# Patient Record
Sex: Female | Born: 1935 | Race: White | Hispanic: No | Marital: Married | Smoking: Former smoker
Health system: Southern US, Community
[De-identification: ages and names within clinical notes are randomized; demographics above are authoritative.]

## PROBLEM LIST (undated history)

## (undated) DIAGNOSIS — F329 Major depressive disorder, single episode, unspecified: Secondary | ICD-10-CM

## (undated) DIAGNOSIS — M545 Low back pain: Secondary | ICD-10-CM

## (undated) DIAGNOSIS — K219 Gastro-esophageal reflux disease without esophagitis: Secondary | ICD-10-CM

## (undated) DIAGNOSIS — Z9289 Personal history of other medical treatment: Secondary | ICD-10-CM

## (undated) DIAGNOSIS — F0390 Unspecified dementia without behavioral disturbance: Secondary | ICD-10-CM

## (undated) DIAGNOSIS — I5032 Chronic diastolic (congestive) heart failure: Secondary | ICD-10-CM

## (undated) DIAGNOSIS — M4800 Spinal stenosis, site unspecified: Secondary | ICD-10-CM

## (undated) DIAGNOSIS — F319 Bipolar disorder, unspecified: Secondary | ICD-10-CM

## (undated) DIAGNOSIS — M47816 Spondylosis without myelopathy or radiculopathy, lumbar region: Secondary | ICD-10-CM

## (undated) DIAGNOSIS — K224 Dyskinesia of esophagus: Secondary | ICD-10-CM

## (undated) DIAGNOSIS — E785 Hyperlipidemia, unspecified: Secondary | ICD-10-CM

## (undated) DIAGNOSIS — I10 Essential (primary) hypertension: Secondary | ICD-10-CM

## (undated) DIAGNOSIS — G8929 Other chronic pain: Secondary | ICD-10-CM

## (undated) DIAGNOSIS — G5603 Carpal tunnel syndrome, bilateral upper limbs: Secondary | ICD-10-CM

## (undated) DIAGNOSIS — F32A Depression, unspecified: Secondary | ICD-10-CM

## (undated) DIAGNOSIS — J189 Pneumonia, unspecified organism: Secondary | ICD-10-CM

## (undated) HISTORY — DX: Unspecified dementia, unspecified severity, without behavioral disturbance, psychotic disturbance, mood disturbance, and anxiety: F03.90

## (undated) HISTORY — DX: Personal history of other medical treatment: Z92.89

## (undated) HISTORY — PX: INNER EAR SURGERY: SHX679

## (undated) HISTORY — DX: Chronic diastolic (congestive) heart failure: I50.32

## (undated) HISTORY — PX: HIP SURGERY: SHX245

---

## 2003-04-15 ENCOUNTER — Encounter: Payer: Self-pay | Admitting: Emergency Medicine

## 2003-04-15 ENCOUNTER — Inpatient Hospital Stay (HOSPITAL_COMMUNITY): Admission: EM | Admit: 2003-04-15 | Discharge: 2003-04-27 | Payer: Self-pay | Admitting: Psychiatry

## 2004-04-12 ENCOUNTER — Emergency Department: Payer: Self-pay | Admitting: Emergency Medicine

## 2007-10-25 ENCOUNTER — Other Ambulatory Visit: Payer: Self-pay

## 2007-10-25 ENCOUNTER — Inpatient Hospital Stay: Payer: Self-pay | Admitting: Internal Medicine

## 2007-11-14 ENCOUNTER — Ambulatory Visit: Payer: Self-pay | Admitting: Unknown Physician Specialty

## 2007-11-28 ENCOUNTER — Ambulatory Visit: Payer: Self-pay | Admitting: Unknown Physician Specialty

## 2008-03-30 ENCOUNTER — Ambulatory Visit: Payer: Self-pay | Admitting: Unknown Physician Specialty

## 2008-06-05 ENCOUNTER — Emergency Department: Payer: Self-pay | Admitting: Emergency Medicine

## 2009-02-17 ENCOUNTER — Emergency Department: Payer: Self-pay | Admitting: Emergency Medicine

## 2009-02-28 ENCOUNTER — Emergency Department: Payer: Self-pay | Admitting: Internal Medicine

## 2009-06-14 ENCOUNTER — Emergency Department: Payer: Self-pay | Admitting: Emergency Medicine

## 2009-10-13 ENCOUNTER — Emergency Department: Payer: Self-pay | Admitting: Unknown Physician Specialty

## 2010-03-15 ENCOUNTER — Inpatient Hospital Stay: Payer: Self-pay | Admitting: Internal Medicine

## 2010-03-17 ENCOUNTER — Inpatient Hospital Stay: Payer: Self-pay | Admitting: Psychiatry

## 2010-04-08 ENCOUNTER — Ambulatory Visit: Payer: Self-pay | Admitting: Psychiatry

## 2010-04-27 ENCOUNTER — Ambulatory Visit: Payer: Self-pay | Admitting: Psychiatry

## 2010-04-27 ENCOUNTER — Ambulatory Visit: Payer: Self-pay | Admitting: Unknown Physician Specialty

## 2010-05-26 ENCOUNTER — Ambulatory Visit: Payer: Self-pay | Admitting: Psychiatry

## 2010-06-27 ENCOUNTER — Ambulatory Visit: Payer: Self-pay | Admitting: Psychiatry

## 2010-07-26 ENCOUNTER — Ambulatory Visit: Payer: Self-pay | Admitting: Psychiatry

## 2010-08-26 ENCOUNTER — Ambulatory Visit: Payer: Self-pay | Admitting: Psychiatry

## 2010-09-25 ENCOUNTER — Ambulatory Visit: Payer: Self-pay | Admitting: Psychiatry

## 2010-10-26 ENCOUNTER — Ambulatory Visit: Payer: Self-pay | Admitting: Psychiatry

## 2010-11-26 ENCOUNTER — Ambulatory Visit: Payer: Self-pay | Admitting: Psychiatry

## 2010-12-21 DIAGNOSIS — K224 Dyskinesia of esophagus: Secondary | ICD-10-CM | POA: Insufficient documentation

## 2010-12-21 DIAGNOSIS — F32A Depression, unspecified: Secondary | ICD-10-CM | POA: Insufficient documentation

## 2010-12-21 DIAGNOSIS — M47816 Spondylosis without myelopathy or radiculopathy, lumbar region: Secondary | ICD-10-CM | POA: Insufficient documentation

## 2010-12-21 DIAGNOSIS — M545 Low back pain, unspecified: Secondary | ICD-10-CM | POA: Insufficient documentation

## 2010-12-21 DIAGNOSIS — G5600 Carpal tunnel syndrome, unspecified upper limb: Secondary | ICD-10-CM | POA: Insufficient documentation

## 2010-12-21 DIAGNOSIS — K219 Gastro-esophageal reflux disease without esophagitis: Secondary | ICD-10-CM | POA: Insufficient documentation

## 2010-12-21 DIAGNOSIS — G8929 Other chronic pain: Secondary | ICD-10-CM | POA: Insufficient documentation

## 2010-12-21 DIAGNOSIS — E785 Hyperlipidemia, unspecified: Secondary | ICD-10-CM | POA: Insufficient documentation

## 2010-12-21 DIAGNOSIS — M4800 Spinal stenosis, site unspecified: Secondary | ICD-10-CM | POA: Insufficient documentation

## 2010-12-21 DIAGNOSIS — F329 Major depressive disorder, single episode, unspecified: Secondary | ICD-10-CM | POA: Insufficient documentation

## 2010-12-26 ENCOUNTER — Ambulatory Visit: Payer: Self-pay | Admitting: Psychiatry

## 2011-01-26 ENCOUNTER — Ambulatory Visit: Payer: Self-pay | Admitting: Unknown Physician Specialty

## 2011-02-27 ENCOUNTER — Ambulatory Visit: Payer: Self-pay | Admitting: Psychiatry

## 2011-03-29 ENCOUNTER — Ambulatory Visit: Payer: Self-pay | Admitting: Psychiatry

## 2011-04-28 ENCOUNTER — Ambulatory Visit: Payer: Self-pay | Admitting: Psychiatry

## 2011-05-26 ENCOUNTER — Ambulatory Visit: Payer: Self-pay | Admitting: Psychiatry

## 2011-06-26 ENCOUNTER — Ambulatory Visit: Payer: Self-pay | Admitting: Psychiatry

## 2011-07-26 ENCOUNTER — Ambulatory Visit: Payer: Self-pay | Admitting: Psychiatry

## 2011-08-26 ENCOUNTER — Ambulatory Visit: Payer: Self-pay | Admitting: Psychiatry

## 2011-09-25 ENCOUNTER — Ambulatory Visit: Payer: Self-pay | Admitting: Psychiatry

## 2011-10-26 ENCOUNTER — Ambulatory Visit: Payer: Self-pay | Admitting: Psychiatry

## 2011-11-29 ENCOUNTER — Ambulatory Visit: Payer: Self-pay | Admitting: Psychiatry

## 2011-12-26 ENCOUNTER — Ambulatory Visit: Payer: Self-pay | Admitting: Psychiatry

## 2012-01-26 ENCOUNTER — Ambulatory Visit: Payer: Self-pay | Admitting: Psychiatry

## 2012-02-25 ENCOUNTER — Ambulatory Visit: Payer: Self-pay | Admitting: Psychiatry

## 2012-03-29 ENCOUNTER — Inpatient Hospital Stay: Payer: Self-pay | Admitting: Psychiatry

## 2012-03-29 LAB — URINALYSIS, COMPLETE
Bilirubin,UR: NEGATIVE
Glucose,UR: NEGATIVE mg/dL (ref 0–75)
Hyaline Cast: 3
Nitrite: NEGATIVE
Ph: 5 (ref 4.5–8.0)
RBC,UR: 11 /HPF (ref 0–5)
Specific Gravity: 1.026 (ref 1.003–1.030)

## 2012-03-30 LAB — BEHAVIORAL MEDICINE 1 PANEL
Albumin: 3.8 g/dL (ref 3.4–5.0)
Alkaline Phosphatase: 104 U/L (ref 50–136)
Anion Gap: 11 (ref 7–16)
BUN: 20 mg/dL — ABNORMAL HIGH (ref 7–18)
Basophil #: 0.1 x10 3/mm 3 (ref 0.0–0.1)
Basophil %: 1.2 %
Bilirubin,Total: 0.3 mg/dL (ref 0.2–1.0)
Calcium, Total: 8.7 mg/dL (ref 8.5–10.1)
Chloride: 103 mmol/L (ref 98–107)
Co2: 28 mmol/L (ref 21–32)
Creatinine: 1 mg/dL (ref 0.60–1.30)
EGFR (African American): >60
EGFR (Non-African Amer.): 55 — ABNORMAL LOW
Eosinophil #: 0.3 x10 3/mm 3 (ref 0.0–0.7)
Eosinophil %: 5.6 %
HCT: 38.7 % (ref 35.0–47.0)
HGB: 12.9 g/dL (ref 12.0–16.0)
Lymphocyte %: 21.3 %
Lymphs Abs: 1.3 x10 3/mm 3 (ref 1.0–3.6)
MCH: 29.9 pg (ref 26.0–34.0)
MCHC: 33.4 g/dL (ref 32.0–36.0)
MCV: 89 fL (ref 80–100)
Monocyte #: 0.7 x10 3/mm (ref 0.2–0.9)
Monocyte %: 11.8 %
Neutrophil #: 3.6 x10 3/mm 3 (ref 1.4–6.5)
Neutrophil %: 60.1 %
Osmolality: 286 (ref 275–301)
Platelet: 273 x10 3/mm 3 (ref 150–440)
Potassium: 3.5 mmol/L (ref 3.5–5.1)
RBC: 4.33 X10 6/mm 3 (ref 3.80–5.20)
RDW: 14.2 % (ref 11.5–14.5)
SGOT(AST): 47 U/L — ABNORMAL HIGH (ref 15–37)
SGPT (ALT): 39 U/L (ref 12–78)
Sodium: 142 mmol/L (ref 136–145)
Thyroid Stimulating Horm: 0.705 u[IU]/mL
Total Protein: 7.2 g/dL (ref 6.4–8.2)
WBC: 5.9 x10 3/mm 3 (ref 3.6–11.0)

## 2012-03-30 LAB — LIPID PANEL
Cholesterol: 144 mg/dL (ref 0–200)
HDL Cholesterol: 33 mg/dL — ABNORMAL LOW (ref 40–60)
Ldl Cholesterol, Calc: 90 mg/dL (ref 0–100)
Triglycerides: 103 mg/dL (ref 0–200)
VLDL Cholesterol, Calc: 21 mg/dL (ref 5–40)

## 2012-04-10 ENCOUNTER — Ambulatory Visit: Payer: Self-pay | Admitting: Psychiatry

## 2012-04-29 ENCOUNTER — Ambulatory Visit: Payer: Self-pay | Admitting: Psychiatry

## 2012-05-26 ENCOUNTER — Ambulatory Visit: Payer: Self-pay | Admitting: Psychiatry

## 2012-06-26 ENCOUNTER — Ambulatory Visit: Payer: Self-pay | Admitting: Psychiatry

## 2012-07-25 ENCOUNTER — Ambulatory Visit: Payer: Self-pay | Admitting: Psychiatry

## 2012-08-07 ENCOUNTER — Ambulatory Visit: Payer: Self-pay | Admitting: Psychiatry

## 2012-08-25 ENCOUNTER — Ambulatory Visit: Payer: Self-pay | Admitting: Psychiatry

## 2012-09-24 ENCOUNTER — Ambulatory Visit: Payer: Self-pay | Admitting: Psychiatry

## 2012-10-14 LAB — BASIC METABOLIC PANEL
BUN: 19 mg/dL — ABNORMAL HIGH (ref 7–18)
Creatinine: 0.96 mg/dL (ref 0.60–1.30)
EGFR (African American): >60
Glucose: 149 mg/dL — ABNORMAL HIGH (ref 65–99)
Potassium: 3.2 mmol/L — ABNORMAL LOW (ref 3.5–5.1)
Sodium: 137 mmol/L (ref 136–145)

## 2012-10-14 LAB — TSH: Thyroid Stimulating Horm: 1.01 u[IU]/mL

## 2012-10-25 ENCOUNTER — Ambulatory Visit: Payer: Self-pay | Admitting: Psychiatry

## 2012-11-25 ENCOUNTER — Ambulatory Visit: Payer: Self-pay | Admitting: Psychiatry

## 2012-12-25 ENCOUNTER — Ambulatory Visit: Payer: Self-pay | Admitting: Psychiatry

## 2013-01-25 ENCOUNTER — Ambulatory Visit: Payer: Self-pay | Admitting: Psychiatry

## 2013-01-26 ENCOUNTER — Ambulatory Visit: Payer: Self-pay | Admitting: Psychiatry

## 2013-02-24 ENCOUNTER — Ambulatory Visit: Payer: Self-pay | Admitting: Psychiatry

## 2013-03-27 ENCOUNTER — Ambulatory Visit: Payer: Self-pay | Admitting: Psychiatry

## 2013-04-28 ENCOUNTER — Ambulatory Visit: Payer: Self-pay | Admitting: Psychiatry

## 2013-05-25 ENCOUNTER — Ambulatory Visit: Payer: Self-pay | Admitting: Psychiatry

## 2013-06-25 ENCOUNTER — Ambulatory Visit: Payer: Self-pay | Admitting: Psychiatry

## 2013-07-25 ENCOUNTER — Ambulatory Visit: Payer: Self-pay | Admitting: Psychiatry

## 2013-08-25 ENCOUNTER — Ambulatory Visit: Payer: Self-pay | Admitting: Psychiatry

## 2013-09-12 DIAGNOSIS — M21619 Bunion of unspecified foot: Secondary | ICD-10-CM

## 2013-09-12 DIAGNOSIS — B351 Tinea unguium: Secondary | ICD-10-CM | POA: Insufficient documentation

## 2013-09-12 DIAGNOSIS — M2010 Hallux valgus (acquired), unspecified foot: Secondary | ICD-10-CM | POA: Insufficient documentation

## 2013-09-12 DIAGNOSIS — M79673 Pain in unspecified foot: Secondary | ICD-10-CM | POA: Insufficient documentation

## 2013-09-12 DIAGNOSIS — M2040 Other hammer toe(s) (acquired), unspecified foot: Secondary | ICD-10-CM | POA: Insufficient documentation

## 2013-09-25 ENCOUNTER — Ambulatory Visit: Payer: Self-pay | Admitting: Psychiatry

## 2013-10-25 ENCOUNTER — Ambulatory Visit: Payer: Self-pay | Admitting: Psychiatry

## 2013-11-25 ENCOUNTER — Ambulatory Visit: Payer: Self-pay | Admitting: Psychiatry

## 2013-12-26 ENCOUNTER — Ambulatory Visit: Payer: Self-pay | Admitting: Psychiatry

## 2014-01-26 ENCOUNTER — Ambulatory Visit: Payer: Self-pay | Admitting: Psychiatry

## 2014-02-24 ENCOUNTER — Ambulatory Visit: Payer: Self-pay | Admitting: Psychiatry

## 2014-03-30 ENCOUNTER — Ambulatory Visit: Payer: Self-pay | Admitting: Psychiatry

## 2014-04-23 ENCOUNTER — Ambulatory Visit: Payer: Self-pay | Admitting: Psychiatry

## 2014-04-27 ENCOUNTER — Ambulatory Visit: Payer: Self-pay | Admitting: Psychiatry

## 2014-05-20 ENCOUNTER — Inpatient Hospital Stay: Payer: Self-pay | Admitting: Psychiatry

## 2014-05-26 ENCOUNTER — Ambulatory Visit
Admit: 2014-05-26 | Disposition: A | Discharge status: Home / Self Care | Payer: Self-pay | Attending: Psychiatry | Admitting: Psychiatry

## 2014-06-26 ENCOUNTER — Ambulatory Visit
Admit: 2014-06-26 | Disposition: A | Discharge status: Home / Self Care | Payer: Self-pay | Attending: Psychiatry | Admitting: Psychiatry

## 2014-07-17 NOTE — H&P (Signed)
PATIENT NAME:  Jenna Dudley, Jenna Dudley MR#:  [id number] DATE OF BIRTH:  1935-05-06  DATE OF ADMISSION:  03/29/2012  IDENTIFYING INFORMATION AND CHIEF COMPLAINT: A 79 year old woman admitted voluntarily after ECT treatment today.   CHIEF COMPLAINT: "I am a horrible person."   HISTORY OF PRESENT ILLNESS: Information obtained from the patient and from the chart as well as my own ongoing treatment knowledge of this patient. The patient is a 79 year old woman with a history of recurrent severe depression who has been managed with maintenance ECT treatment as well as medication treatment for years. Over the past 2 to 3 weeks her symptoms of depression have returned and worsened rapidly. She is feeling sad and down. She feels worthless about herself with constant negative thoughts. She is calling herself a terrible person and saying that she wants to leave her husband because she thinks he will be happier without her. She is agitated and jittery. Not thinking clearly. Crying more frequently. Unable to function in her usual social roles. She is not voicing specific suicidal ideation but talks about herself as being worthless and not worthy. She has been compliant with her prescribed psychiatric medicine which actually had been increased within the last week and a half and has also been compliant with ECT which has been increased in frequency and nevertheless has had a worsening of her depressive symptoms. There was a death of a relative, although not a very close one, in the family recently. The holidays are often stressful for the patient simply because of the increase in social activity, but as far as we know there is no other specific stress. Her medical problems appear to be stable.   SOCIAL HISTORY: The patient lives with her husband and they have a good and apparently very supportive relationship. The patient does not work outside the home. They appear to be, as far as I have ever been able to tell, financially in  reasonably good shape. They have adult children, particularly 2 daughters, one of whom lives in the [place] area and one of whom lives in [place]. The patient has always appeared to be on excellent terms with her daughters. The patient usually spends her time at home doing housework, cooking and taking care of her husband's needs and has generally seemed content with that. She has been able to interact well with her family and enjoy usual social activities. All of this seems to have gotten worse recently.   PAST MEDICAL HISTORY: The patient has chronic back pain probably due to arthritis which also sometimes is felt in her legs. She also has some degree of mild hypertension. Otherwise appears to be in fairly good health. She does have a history of an old Bell's palsy with some residual squintiness on the right side of her face.   SUBSTANCE ABUSE HISTORY: No alcohol or drug abuse. No history of drug abuse.   PAST PSYCHIATRIC HISTORY: The patient has had recurrent episodes of severe depression with the most recent one that required hospitalization in December through January of 2012. It was during that hospitalization that she had her first episode of ECT. She has had a very good response to ECT and good tolerance and has been maintained on right unilateral treatment on an acute 2 to 3 regimen since then. She generally tolerates medications well and has been maintained most recently on a combination of Zoloft and Zyprexa and Remeron. I recently increased her Zyprexa from 10 mg twice a day to 10 mg in the morning  and 20 mg at night. She does not have a history of actual suicide attempts. She does have a history of psychotic negative thinking when depressed. Also a history of getting nearly catatonic with her previous depression.   REVIEW OF SYSTEMS: Complains of feeling sad, down and negative. Tired. Thinks that she is a worthless person. Thinks that she is guilty of all kinds of horrible things. No other  specific physical complaints, except for her chronic back and upper leg pain.   MENTAL STATUS EXAM: Slightly more disheveled than usual, well groomed woman who looks her stated age. Eye contact good. Psychomotor activity more slow and negative than usual. Affect is anxious and dysphoric and tearful. Thoughts are extremely negative focus. She is much less chatty and interactive than usual, except to say negative things about herself. She is not reporting hallucinations. She does not respond directly to questions about suicidal ideation, but talks about negative and bad she is. No homicidal ideation. Mood is stated as bad. Thoughts disorganized. Possibly somewhat paranoid. The patient's baseline intelligence is a very mild degree of dementia at most. She seems to be a little bit slower today than usual. Judgment and insight somewhat impaired, but she did agree to hospitalization and treatment.   PHYSICAL EXAMINATION: Does not appear to be in any acute physical distress. No acute skin lesions. Her right eye is a little bit squinty as usual, which is chronic for her. Otherwise face is symmetric. She has mild tenderness in her lower back and upper legs. Has a decreased range of motion at joints all over, especially at proximal joints.  Gait is a little bit stiff. Her strength is symmetric, reflexes symmetric. Other than the squintiness from the Bell's palsy, cranial nerves appear to be intact. The lungs are clear with no wheezes. Heart regular rate and rhythm. Abdomen soft and nontender, normal bowel sounds. During ECT she had a blood pressure running between 152 and 174 over about 70 to 95. Baseline pulse was about 90. Temperature was normal at 98.6.   ASSESSMENT: This is a 79 year old woman who is admitted for her severe recurrent depression with psychotic features. She is going downhill despite aggressive treatment with ECT. She got ECT last Friday and again today and yet she is getting worse. Also had had a  recent aggressive increase in her medication despite which she is getting worse. I am concerned about her negativity and the talk she is having about wanting to leave her husband, about her safety at home. I am not sure the husband has the ability to really be with her constantly and keep her safe given that he is elderly as well. It really seems like the safest and wisest thing to do is to admit her to the hospital. The patient and husband are agreeable.   MEDICATIONS: 1. Zyprexa 10 mg in the morning, 20 mg at night.  2. Zoloft 200 mg q. a.Dudley. 3. Remeron 45 mg at night.   ALLERGIES: ASPIRIN AND SULFA.   TREATMENT PLAN: Admit to psychiatry. Suicide precautions. Continue medication. I am going to recheck her labs as well as the lipids and fasting blood sugar. We will be seeing her daily with daily therapy and consideration about changing medication, but we are also going to go back into an index course of ECT. Thus far she has had a good response to right unilateral ECT and always seems to have what ought to be effective seizures. I will reconsider whether we want to change that  to bilateral, although I certainly do not want to make her delirious. The patient will be engaged in groups and activities with daily and group therapy as well. We will keep her husband actively involved with treatment.   DIAGNOSIS PRINCIPLE AND PRIMARY:   AXIS I: Major depression severe, recurrent, with psychotic features.   SECONDARY DIAGNOSES:  AXIS I: No further.   AXIS II: No diagnosis.   AXIS III: Hypertension and chronic back pain.     AXIS IV: Severe just from the acute burden of illness.   AXIS V: Functioning at time of admission is 35. ____________________________ Audery Amel, MD jtc:sb D: 03/29/2012 12:25:18 ET T: 03/29/2012 13:08:24 ET JOB#: [id number]  cc: Audery Amel, MD, <Dictator> Audery Amel MD ELECTRONICALLY SIGNED 03/29/2012 17:21

## 2014-07-17 NOTE — Discharge Summary (Signed)
PATIENT NAME:  Jenna Dudley, Jenna M MR#:  [id number] DATE OF BIRTH:  1935/10/19  DATE OF ADMISSION:  03/29/2012 DATE OF DISCHARGE:  04/01/2012  HOSPITAL COURSE: See dictated history and physical for details of admission. This 79 year old woman with a history of severe major depression was admitted directly from ECT on January 3. She had presented to her ECT treatment with a significant decompensation. She was having disorganized psychotic thoughts with a somewhat suicidal tinge to them. Not functioning well at home. Much more depressed and agitated. Very typical of the kind of presentation she has had in the past. In the hospital, the patient was continued on her antidepressant medication, including Remeron, Zoloft and olanzapine which had recently been increased to a total of 30 mg a day. She was still somewhat depressed on Saturday but beginning on Sunday showed a significant improvement. Her mood was more upbeat. She was more positive. She consistently denied any suicidal ideation. She was able to cite positive things to look forward to in her life. ECT was done on Monday, January 6, and she tolerated the treatment well as usual. After treatment, her mood continues to be upbeat. She is smiling and lucid. No clear memory problems. Totally denies suicidal ideation. Seems to have recovered back to her baseline or nearly so. The plan is for discharge today, and she will have her next ECT treatment in about 9 days. She is agreeable to the plan.   DISCHARGE MEDICATIONS: Simvastatin 40 mg at bedtime, potassium chloride 20 mEq per day, Prilosec 20 mg p.o. daily, magnesium 400 mg p.o. daily, Hyzaar 50/12.5 one 1 tablet per day, Desyrel 100 mg at night p.r.n. for sleep, Zoloft 200 mg in the morning, olanzapine 10 mg in the morning and 20 mg at night, Remeron 45 mg at night.   LABORATORY RESULTS: EKG showed normal sinus rhythm. Urinalysis positive for protein. Does look like a probable urinary tract infection. I will go  ahead and start her on Macrodantin prior to discharge. Lipid panel entirely normal except for a slightly low HDL at 33. Glucose normal at 99. Blood counts all normal. Chest x-ray unremarkable.   MENTAL STATUS EXAM: Casually dressed, neatly groomed woman, looks her stated age, cooperative with the interview. Good eye contact. Normal psychomotor activity. Speech normal in rate, tone and volume. Affect smiling, upbeat, reactive, appropriate. Mood stated as being good. Thoughts are lucid without loosening of associations or delusions. No evidence of thought disorder. Denies hallucinations. Denies suicidal or homicidal ideation. Improved judgment and insight. Normal intelligence. Alert and oriented x4.   DISPOSITION: Discharge home on her current medications. Follow up with me in about 9 days for next ECT treatment. She continues to see me as an outpatient for medication management.   DIAGNOSIS PRINCIPLE AND PRIMARY:  AXIS I: Major depression, severe, with psychotic features, recurrent.   SECONDARY DIAGNOSES:  AXIS I: No further.  AXIS II: No diagnosis.  AXIS III: Hypertension, chronic back pain, urinary tract infection.  AXIS IV: Moderate from chronic illness.  AXIS V: Functioning at time of discharge 60.    ____________________________ Audery AmelJohn T. Ellamay Fors, MD jtc:gb D: 04/01/2012 12:52:49 ET T: 04/01/2012 21:41:27 ET JOB#: [id number]  cc: Audery AmelJohn T. Khalis Hittle, MD, <Dictator> Audery AmelJOHN T Naleigha Raimondi MD ELECTRONICALLY SIGNED 04/02/2012 18:27

## 2014-07-25 ENCOUNTER — Other Ambulatory Visit: Payer: Self-pay

## 2014-07-26 NOTE — H&P (Signed)
PATIENT NAME:  Jenna Dudley, Meilah M MR#:  [id number] DATE OF BIRTH:  04-05-1935  IDENTIFYING INFORMATION AND REASON FOR ADMISSION: A 79 year old woman with a history of recurrent severe major depression who is followed in ECT. Presenting with psychotic symptoms and worsening mood symptoms.   HISTORY OF PRESENT ILLNESS: I follow this patient in ECT for years now. Over the last 2-3 weeks she has shown a dramatic worsening of her symptoms. Her confusion has worsened and she has become more focused on apologizing for imaginary problems and focused on inappropriate guilt. She is ruminating on negative ideas. She has been spending more time in bed, is reclusive, not doing her usual activities. This is a similar pattern to what we have seen in the past when her depression is worsening. I had tried to increase the frequency of her ECT, but the patient became noncompliant and due to psychosis was refusing to come into the hospital. I negotiated with her husband, who felt it was safe for her to stay at home, but today when she presented for her regular ECT treatment it is clear that her symptoms are still pretty severe. It is unclear how well she has been compliant with her medication.   PAST PSYCHIATRIC HISTORY: History of multiple episodes of recurrent severe depression. She has been receiving ECT for several years averaging about every 2 weeks maintenance treatment and has stayed stable with that treatment, however she has had a couple other episodes of decompensation like this which have usually responded to 3-4 treatments in a row. She has been on several antipsychotics and antidepressants and tolerates her current ones fairly well. The patient has a history of suicidal ideation in the past.   SOCIAL HISTORY: Lives with her husband. Several adult children. Husband is retired and stays at home with her. Good relationship between the 2 of them. Does not work outside the home.   PAST MEDICAL HISTORY: The patient has high  blood pressure and chronic back pain, elevated cholesterol.   FAMILY HISTORY: None identified.   SUBSTANCE ABUSE HISTORY: No history of alcohol or drug abuse.   REVIEW OF SYSTEMS: The patient is currently complaining of her chronic back pain, but otherwise is not really able to give an adequate review of systems because of her disorganized thinking.   MENTAL STATUS EXAMINATION: A somewhat disheveled woman, looks her stated age. She has a hard time cooperating with the interview because of her preoccupation with her own bizarre thinking. The patient says that she is not feeling good and uses the term "weird" several times to describe how she is feeling. Eye contact intermittent. Psychomotor activity retarded. Speech is quiet, decreased in amount quite a bit from her usual baseline. Affect is dysphoric, anxious, almost tearful. Not reporting to me acute suicidal ideation, but made suicidal statements the ECT nurse earlier today and has said that she does not mind whether she lives or dies. No homicidal ideation. Talks about how she is seeing things and hearing things. The patient is not able to cooperate with cognitive testing right now because of her disorganized thinking.   LABORATORY RESULTS: So far a chemistry panel is back along with a CBC. Slightly low potassium at 3.2, otherwise unremarkable chemistry panel and the CBC is all normal.   VITAL SIGNS: During ECT today blood pressure was slightly elevated, but not dramatically so.   PHYSICAL EXAMINATION:  GENERAL:  Older woman, looks her stated age.  HEENT:  She has a chronic squint in her right eye  that is no different from usual, otherwise face is symmetric. Oral mucosa dry.  SKIN:  No acute new skin lesions.  EXTREMITIES:  Able to use all extremities, but gait is arthralgic.   LUNGS:  She has clear lungs with no wheezes, no shortness of breath.  HEART: Regular rate and rhythm. No extra sounds.  ABDOMEN: Nontender, normal bowel sounds.   NEUROLOGIC:  Cranial nerves intact and symmetric, strength and reflexes intact and symmetric.   ASSESSMENT: A 79 year old woman with recurrent severe major depression with psychotic symptoms, is worsening with inability to care for herself, worsening psychosis and some stated suicidal ideation. Needs hospitalization for safety while we work to get her out of this.   TREATMENT PLAN: Admit the patient to the hospital. Suicide precautions in place. Continue current medications which include Zyprexa 10 mg at night, Remeron 45 mg at night, Zoloft 200 mg a day as well as her medical medications and her Seroquel 100 mg at night. Increase Zyprexa to 20 mg a day. ECT 3 times a week scheduled with next treatment Friday.  DIAGNOSIS PRINCIPAL AND PRIMARY:   AXIS I: Major depression, severe, recurrent, with psychotic features.   SECONDARY DIAGNOSES:   AXIS I: No further.   AXIS II: No diagnosis.   AXIS III:  1.  High blood pressure.  2.  Dyslipidemia.  3.  Low potassium.  4.  Gastric reflux symptoms.  5.  Chronic back pain.     ____________________________ Audery Amel, MD jtc:bu D: 05/20/2014 14:12:00 ET T: 05/20/2014 14:28:39 ET JOB#: [id number]  cc: Audery Amel, MD, <Dictator> Audery Amel MD ELECTRONICALLY SIGNED 06/02/2014 10:36

## 2014-07-27 DIAGNOSIS — G5603 Carpal tunnel syndrome, bilateral upper limbs: Secondary | ICD-10-CM

## 2014-07-27 DIAGNOSIS — M47816 Spondylosis without myelopathy or radiculopathy, lumbar region: Secondary | ICD-10-CM

## 2014-07-27 HISTORY — DX: Carpal tunnel syndrome, bilateral upper limbs: G56.03

## 2014-07-27 HISTORY — PX: REPLACEMENT TOTAL KNEE BILATERAL: SUR1225

## 2014-07-27 HISTORY — DX: Spondylosis without myelopathy or radiculopathy, lumbar region: M47.816

## 2014-07-29 ENCOUNTER — Encounter: Payer: Self-pay | Admitting: Anesthesiology

## 2014-07-29 ENCOUNTER — Encounter
Admission: RE | Admit: 2014-07-29 | Discharge: 2014-07-29 | Disposition: A | Discharge status: Home / Self Care | Payer: Medicare Other | Source: Ambulatory Visit | Attending: Psychiatry | Admitting: Psychiatry

## 2014-07-29 DIAGNOSIS — E785 Hyperlipidemia, unspecified: Secondary | ICD-10-CM

## 2014-07-29 DIAGNOSIS — K56600 Partial intestinal obstruction, unspecified as to cause: Secondary | ICD-10-CM

## 2014-07-29 DIAGNOSIS — M4800 Spinal stenosis, site unspecified: Secondary | ICD-10-CM

## 2014-07-29 DIAGNOSIS — F333 Major depressive disorder, recurrent, severe with psychotic symptoms: Secondary | ICD-10-CM | POA: Diagnosis not present

## 2014-07-29 DIAGNOSIS — M545 Low back pain, unspecified: Secondary | ICD-10-CM

## 2014-07-29 DIAGNOSIS — K224 Dyskinesia of esophagus: Secondary | ICD-10-CM

## 2014-07-29 DIAGNOSIS — G8929 Other chronic pain: Secondary | ICD-10-CM

## 2014-07-29 DIAGNOSIS — I5033 Acute on chronic diastolic (congestive) heart failure: Secondary | ICD-10-CM

## 2014-07-29 HISTORY — DX: Hyperlipidemia, unspecified: E78.5

## 2014-07-29 HISTORY — DX: Spondylosis without myelopathy or radiculopathy, lumbar region: M47.816

## 2014-07-29 HISTORY — DX: Depression, unspecified: F32.A

## 2014-07-29 HISTORY — DX: Low back pain: M54.5

## 2014-07-29 HISTORY — DX: Major depressive disorder, single episode, unspecified: F32.9

## 2014-07-29 HISTORY — DX: Spinal stenosis, site unspecified: M48.00

## 2014-07-29 HISTORY — DX: Carpal tunnel syndrome, bilateral upper limbs: G56.03

## 2014-07-29 HISTORY — DX: Low back pain, unspecified: M54.50

## 2014-07-29 HISTORY — DX: Other chronic pain: G89.29

## 2014-07-29 HISTORY — DX: Dyskinesia of esophagus: K22.4

## 2014-07-29 HISTORY — DX: Gastro-esophageal reflux disease without esophagitis: K21.9

## 2014-07-29 HISTORY — DX: Essential (primary) hypertension: I10

## 2014-07-29 MED ORDER — KETOROLAC TROMETHAMINE 30 MG/ML IJ SOLN
30.0000 mg | Freq: Once | INTRAMUSCULAR | Status: AC
  Administered 2014-07-29: 30 mg via INTRAVENOUS

## 2014-07-29 MED ORDER — METHOHEXITAL 100 MG/10 ML PREMIX SYRINGE
60.0000 mg | Freq: Once | INTRAVENOUS | Status: AC
  Administered 2014-07-29: 60 mg via INTRAVENOUS

## 2014-07-29 MED ORDER — DEXTROSE 5 % IV SOLN
250.0000 mL | Freq: Once | INTRAVENOUS | Status: AC
  Administered 2014-07-29: 250 mL via INTRAVENOUS

## 2014-07-29 MED ORDER — LABETALOL HCL 5 MG/ML IV SOLN
20.0000 mg | Freq: Once | INTRAVENOUS | Status: AC
  Administered 2014-07-29: 20 mg via INTRAVENOUS

## 2014-07-29 MED ORDER — SUCCINYLCHOLINE CHLORIDE 20 MG/ML IJ SOLN
80.0000 mg | Freq: Once | INTRAMUSCULAR | Status: AC
  Administered 2014-07-29: 80 mg via INTRAVENOUS

## 2014-07-29 NOTE — Anesthesia Postprocedure Evaluation (Signed)
  Anesthesia Post-op Note  Patient: Jenna Dudley  Procedure(s) Performed: * No procedures listed *  Anesthesia type:General  Patient location: PACU  Post pain: Pain level controlled  Post assessment: Post-op Vital signs reviewed, Patient's Cardiovascular Status Stable, Respiratory Function Stable, Patent Airway and No signs of Nausea or vomiting  Post vital signs: Reviewed and stable  Last Vitals:  Filed Vitals:   07/29/14 1159  BP: 151/70  Pulse: 78  Temp: 37.3 C  Resp: 17    Level of consciousness: awake, alert  and patient cooperative  Complications: No apparent anesthesia complications

## 2014-07-29 NOTE — Anesthesia Preprocedure Evaluation (Addendum)
Anesthesia Evaluation  Patient identified by MRN, date of birth, ID band Patient awake    History of Anesthesia Complications Negative for: history of anesthetic complications  Airway Mallampati: II       Dental   Pulmonary former smoker,  + rhonchi         Cardiovascular hypertension, Normal cardiovascular exam    Neuro/Psych Depression  Neuromuscular disease    GI/Hepatic Neg liver ROS, GERD-  ,  Endo/Other  negative endocrine ROS  Renal/GU negative Renal ROS  negative genitourinary   Musculoskeletal  (+) Arthritis -, Osteoarthritis,    Abdominal Normal abdominal exam  (+)   Peds negative pediatric ROS (+)  Hematology negative hematology ROS (+)   Anesthesia Other Findings   Reproductive/Obstetrics                            Anesthesia Physical Anesthesia Plan  ASA: III  Anesthesia Plan: General   Post-op Pain Management:    Induction: Intravenous  Airway Management Planned: Nasal Cannula  Additional Equipment:   Intra-op Plan:   Post-operative Plan:   Informed Consent: I have reviewed the patients History and Physical, chart, labs and discussed the procedure including the risks, benefits and alternatives for the proposed anesthesia with the patient or authorized representative who has indicated his/her understanding and acceptance.     Plan Discussed with: CRNA and Surgeon  Anesthesia Plan Comments:         Anesthesia Quick Evaluation

## 2014-07-29 NOTE — Anesthesia Procedure Notes (Addendum)
Date/Time: 07/29/2014 11:42 AM Performed by: Lily KocherPERALTA, Devaney Segers Pre-anesthesia Checklist: Patient identified, Emergency Drugs available, Suction available, Patient being monitored and Timeout performed Patient Re-evaluated:Patient Re-evaluated prior to inductionOxygen Delivery Method: Circle system utilized Preoxygenation: Pre-oxygenation with 100% oxygen Intubation Type: IV induction   Date/Time: 07/29/2014 11:43 AM Performed by: Lily KocherPERALTA, Sarha Bartelt Ventilation: Mask ventilation without difficulty Airway Equipment and Method: Bite block Placement Confirmation: positive ETCO2

## 2014-07-29 NOTE — Transfer of Care (Signed)
Immediate Anesthesia Transfer of Care Note  Patient: Jenna Dudley  Procedure(s) Performed: ECT  Patient Location: PACU  Anesthesia Type:General  Level of Consciousness: sedated  Airway & Oxygen Therapy: Patient Spontanous Breathing and Patient connected to face mask oxygen  Post-op Assessment: Report given to RN and Post -op Vital signs reviewed and stable  Post vital signs: Reviewed and stable  Last Vitals:  Filed Vitals:   07/29/14 0900  BP: 148/74  Pulse: 97  Temp: 37.1 C  Resp: 18    Complications: No apparent anesthesia complications

## 2014-08-10 ENCOUNTER — Other Ambulatory Visit: Payer: Self-pay

## 2014-08-14 ENCOUNTER — Other Ambulatory Visit: Payer: Self-pay

## 2014-08-19 ENCOUNTER — Encounter: Payer: Self-pay | Admitting: Anesthesiology

## 2014-08-19 ENCOUNTER — Encounter
Admission: RE | Admit: 2014-08-19 | Discharge: 2014-08-19 | Disposition: A | Discharge status: Home / Self Care | Payer: Medicare Other | Source: Ambulatory Visit | Attending: Psychiatry | Admitting: Psychiatry

## 2014-08-19 DIAGNOSIS — F333 Major depressive disorder, recurrent, severe with psychotic symptoms: Secondary | ICD-10-CM | POA: Diagnosis not present

## 2014-08-19 MED ORDER — DEXTROSE 5 % IV SOLN
250.0000 mL | Freq: Once | INTRAVENOUS | Status: AC
  Administered 2014-08-19: 250 mL via INTRAVENOUS

## 2014-08-19 MED ORDER — LABETALOL HCL 5 MG/ML IV SOLN
20.0000 mg | Freq: Once | INTRAVENOUS | Status: AC
  Administered 2014-08-19: 20 mg via INTRAVENOUS

## 2014-08-19 MED ORDER — METHOHEXITAL 100 MG/10 ML PREMIX SYRINGE
60.0000 mg | Freq: Once | INTRAVENOUS | Status: AC
  Administered 2014-08-19: 60 mg via INTRAVENOUS

## 2014-08-19 MED ORDER — KETOROLAC TROMETHAMINE 30 MG/ML IJ SOLN
30.0000 mg | Freq: Once | INTRAMUSCULAR | Status: AC
  Administered 2014-08-19: 30 mg via INTRAVENOUS

## 2014-08-19 MED ORDER — SUCCINYLCHOLINE CHLORIDE 20 MG/ML IJ SOLN
80.0000 mg | Freq: Once | INTRAMUSCULAR | Status: AC
  Administered 2014-08-19: 80 mg via INTRAVENOUS

## 2014-08-19 NOTE — Procedures (Signed)
ECT SERVICES Physician's Interval Evaluation & Treatment Note  Patient Identification: Jenna Dudley MRN:  [id number] Date of Evaluation:  08/19/2014 TX #: [id number]  MADRS:   MMSE:   P.E. Findings:  No change to PE  Psychiatric Interval Note:  Mood down andd having speech typical of depressions. No SI  Subjective:  Patient is a 79 y.o. female seen for evaluation for Electroconvulsive Therapy. Some awareness she is doing worse after being away too long  Treatment Summary:   [x]   Right Unilateral             []  Bilateral   % Energy : 0.583ms 100%   Impedance: 1300  Seizure Energy Index: 4363  Postictal Suppression Index: 92  Seizure Concordance Index: 92  Medications  Pre Shock: lab 20mg , tor 30mg , brev 60mg , suc 80mg   Post Shock:    Seizure Duration: 13/45s   Comments: Needs to come back in one week.   Lungs:  [x]   Clear to auscultation               []  Other:   Heart:    [x]   Regular rhythm             []  irregular rhythm    [x]   Previous H&P reviewed, patient examined and there are NO CHANGES                 []   Previous H&P reviewed, patient examined and there are changes noted.   Mordecai RasmussenLAPACS, Erva Koke, MD 5/25/201611:06 AM

## 2014-08-19 NOTE — Anesthesia Procedure Notes (Signed)
Performed by: Jewelle Whitner Patient Re-evaluated:Patient Re-evaluated prior to inductionOxygen Delivery Method: Circle system utilized Preoxygenation: Pre-oxygenation with 100% oxygen Intubation Type: IV induction Ventilation: Mask ventilation without difficulty and Mask ventilation throughout procedure Airway Equipment and Method: Bite block Placement Confirmation: positive ETCO2 Dental Injury: Teeth and Oropharynx as per pre-operative assessment      

## 2014-08-19 NOTE — Anesthesia Postprocedure Evaluation (Signed)
  Anesthesia Post-op Note  Patient: Jenna Dudley  Procedure(s) Performed: * No procedures listed *  Anesthesia type:General  Patient location: PACU  Post pain: Pain level controlled  Post assessment: Post-op Vital signs reviewed, Patient's Cardiovascular Status Stable, Respiratory Function Stable, Patent Airway and No signs of Nausea or vomiting  Post vital signs: Reviewed and stable  Last Vitals:  Filed Vitals:   08/19/14 1227  BP: 124/58  Pulse:   Temp:   Resp:     Level of consciousness: awake, alert  and patient cooperative  Complications: No apparent anesthesia complications

## 2014-08-19 NOTE — Anesthesia Preprocedure Evaluation (Signed)
Anesthesia Evaluation  Patient identified by MRN, date of birth, ID band Patient awake    History of Anesthesia Complications Negative for: history of anesthetic complications  Airway Mallampati: II       Dental   Pulmonary former smoker,  + rhonchi         Cardiovascular hypertension, Normal cardiovascular exam    Neuro/Psych Depression  Neuromuscular disease    GI/Hepatic Neg liver ROS, GERD-  ,  Endo/Other  negative endocrine ROS  Renal/GU negative Renal ROS  negative genitourinary   Musculoskeletal  (+) Arthritis -, Osteoarthritis,    Abdominal Normal abdominal exam  (+)   Peds negative pediatric ROS (+)  Hematology negative hematology ROS (+)   Anesthesia Other Findings   Reproductive/Obstetrics                             Anesthesia Physical  Anesthesia Plan  ASA: III  Anesthesia Plan: General   Post-op Pain Management:    Induction: Intravenous  Airway Management Planned: Nasal Cannula  Additional Equipment:   Intra-op Plan:   Post-operative Plan:   Informed Consent: I have reviewed the patients History and Physical, chart, labs and discussed the procedure including the risks, benefits and alternatives for the proposed anesthesia with the patient or authorized representative who has indicated his/her understanding and acceptance.     Plan Discussed with: CRNA and Surgeon  Anesthesia Plan Comments:         Anesthesia Quick Evaluation

## 2014-08-19 NOTE — Transfer of Care (Signed)
Immediate Anesthesia Transfer of Care Note  Patient: Jenna Dudley  Procedure(s) Performed: ECT  Patient Location: PACU  Anesthesia Type:General  Level of Consciousness: awake, alert  and patient cooperative  Airway & Oxygen Therapy: Patient Spontanous Breathing and Patient connected to face mask oxygen  Post-op Assessment: Report given to RN  Post vital signs: Reviewed and stable  Last Vitals:  Filed Vitals:   08/19/14 1129  BP: 130/55  Pulse: 81  Temp: 36.1 C  Resp: 19    Complications: No apparent anesthesia complications

## 2014-08-21 ENCOUNTER — Other Ambulatory Visit: Payer: Self-pay | Admitting: Psychiatry

## 2014-09-14 ENCOUNTER — Telehealth: Payer: Self-pay

## 2014-09-14 NOTE — Telephone Encounter (Signed)
pt husband called wants to speak with you about ect treatments. pt husband said that his wife has canceled 2 ect treatment and that he wold like to talk with you.

## 2014-09-15 ENCOUNTER — Encounter: Payer: Self-pay | Admitting: Emergency Medicine

## 2014-09-15 ENCOUNTER — Emergency Department
Admission: EM | Admit: 2014-09-15 | Discharge: 2014-09-17 | Disposition: A | Discharge status: Other Acute Inpatient Hospital | Payer: Medicare Other | Attending: Emergency Medicine | Admitting: Emergency Medicine

## 2014-09-15 DIAGNOSIS — F1310 Sedative, hypnotic or anxiolytic abuse, uncomplicated: Secondary | ICD-10-CM | POA: Diagnosis not present

## 2014-09-15 DIAGNOSIS — F333 Major depressive disorder, recurrent, severe with psychotic symptoms: Secondary | ICD-10-CM | POA: Diagnosis not present

## 2014-09-15 DIAGNOSIS — Z87891 Personal history of nicotine dependence: Secondary | ICD-10-CM | POA: Diagnosis not present

## 2014-09-15 DIAGNOSIS — I10 Essential (primary) hypertension: Secondary | ICD-10-CM | POA: Insufficient documentation

## 2014-09-15 DIAGNOSIS — R440 Auditory hallucinations: Secondary | ICD-10-CM | POA: Diagnosis present

## 2014-09-15 DIAGNOSIS — F1510 Other stimulant abuse, uncomplicated: Secondary | ICD-10-CM | POA: Insufficient documentation

## 2014-09-15 DIAGNOSIS — F29 Unspecified psychosis not due to a substance or known physiological condition: Secondary | ICD-10-CM | POA: Diagnosis not present

## 2014-09-15 DIAGNOSIS — Z79899 Other long term (current) drug therapy: Secondary | ICD-10-CM | POA: Insufficient documentation

## 2014-09-15 LAB — COMPREHENSIVE METABOLIC PANEL
ALBUMIN: 4.4 g/dL (ref 3.5–5.0)
ALK PHOS: 86 U/L (ref 38–126)
ALT: 23 U/L (ref 14–54)
AST: 33 U/L (ref 15–41)
Anion gap: 9 (ref 5–15)
BUN: 20 mg/dL (ref 6–20)
CO2: 25 mmol/L (ref 22–32)
Calcium: 9.4 mg/dL (ref 8.9–10.3)
Chloride: 104 mmol/L (ref 101–111)
Creatinine, Ser: 0.83 mg/dL (ref 0.44–1.00)
GFR calc Af Amer: >60 mL/min (ref >60)
GFR calc non Af Amer: >60 mL/min (ref >60)
Glucose, Bld: 127 mg/dL — ABNORMAL HIGH (ref 65–99)
Potassium: 3.9 mmol/L (ref 3.5–5.1)
Sodium: 138 mmol/L (ref 135–145)
Total Bilirubin: 0.5 mg/dL (ref 0.3–1.2)
Total Protein: 7.5 g/dL (ref 6.5–8.1)

## 2014-09-15 LAB — CBC WITH DIFFERENTIAL/PLATELET
BASOS ABS: 0.1 10*3/uL (ref 0–0.1)
Basophils Relative: 1 %
EOS ABS: 0.3 10*3/uL (ref 0–0.7)
Eosinophils Relative: 4 %
HCT: 40.9 % (ref 35.0–47.0)
Hemoglobin: 13.5 g/dL (ref 12.0–16.0)
LYMPHS PCT: 22 %
Lymphs Abs: 1.6 10*3/uL (ref 1.0–3.6)
MCH: 28.6 pg (ref 26.0–34.0)
MCHC: 33 g/dL (ref 32.0–36.0)
MCV: 86.7 fL (ref 80.0–100.0)
Monocytes Absolute: 0.7 10*3/uL (ref 0.2–0.9)
Monocytes Relative: 10 %
NEUTROS PCT: 63 %
Neutro Abs: 4.7 10*3/uL (ref 1.4–6.5)
PLATELETS: 319 10*3/uL (ref 150–440)
RBC: 4.71 MIL/uL (ref 3.80–5.20)
RDW: 14.5 % (ref 11.5–14.5)
WBC: 7.4 10*3/uL (ref 3.6–11.0)

## 2014-09-15 LAB — ACETAMINOPHEN LEVEL: Acetaminophen (Tylenol), Serum: <10 ug/mL — ABNORMAL LOW (ref 10–30)

## 2014-09-15 LAB — SALICYLATE LEVEL

## 2014-09-15 LAB — ETHANOL: Alcohol, Ethyl (B): <5 mg/dL (ref <5)

## 2014-09-15 NOTE — ED Provider Notes (Signed)
[facility] Emergency Department Provider Note  ____________________________________________  Time seen: Approximately 1030 PM  I have reviewed the triage vital signs and the nursing notes.   HISTORY  Chief Complaint Hallucinations    HPI Jenna Dudley is a 79 y.o. female with a history of severe recurrent major depression with psychotic features who presents with auditory hallucinations over the past 2 weeks. The patient has been on ECT therapy for 3 years but has missed her last 2 sessions because her husband says she has become anxious about going to the last 2 sessions. Over the past 2 weeks since missing the sessions she has become increasingly disoriented with increased auditory hallucinations. She is denying any suicidal or homicidal ideation. When asked what the auditory hallucinations are she says "you have to push on it."The patient is seen by Dr. Toni Amend and was scheduled for ECT this Friday and was told to come in only if the husband could not handle the situation anymore home. The patient's husband says that the patient has gotten like this in the past when she has needed ECT.   Past Medical History  Diagnosis Date  . Hypertension   . Hyperlipemia 07/29/14  . Chronic low back pain 07/29/14  . Spinal stenosis 07/29/14  . GERD (gastroesophageal reflux disease)   . Depression   . Esophageal spasm 07/29/14  . Bilateral carpal tunnel syndrome 07/27/14  . Degenerative arthritis of lumbar spine 07/27/14    Patient Active Problem List   Diagnosis Date Noted  . Severe recurrent major depression with psychotic features     Past Surgical History  Procedure Laterality Date  . Hip surgery Right   . Replacement total knee bilateral Bilateral 07/27/14  . Inner ear surgery Right     Current Outpatient Rx  Name  Route  Sig  Dispense  Refill  . acetaminophen-codeine (TYLENOL #3) 300-30 MG per tablet   Oral   Take by mouth every 8 (eight) hours as needed for  moderate pain.         Marland Kitchen losartan-hydrochlorothiazide (HYZAAR) 50-12.5 MG per tablet   Oral   Take 1 tablet by mouth daily.         . methocarbamol (ROBAXIN) 750 MG tablet   Oral   Take 750 mg by mouth every 6 (six) hours as needed for muscle spasms.         . mirtazapine (REMERON) 45 MG tablet   Oral   Take 45 mg by mouth at bedtime.         Marland Kitchen OLANZapine (ZYPREXA) 20 MG tablet   Oral   Take 20 mg by mouth at bedtime.         Marland Kitchen omeprazole (PRILOSEC) 20 MG capsule   Oral   Take 20 mg by mouth daily.         . potassium chloride SA (K-DUR,KLOR-CON) 20 MEQ tablet   Oral   Take 20 mEq by mouth daily.         . QUEtiapine (SEROQUEL) 100 MG tablet   Oral   Take 150 mg by mouth at bedtime. 1.5 tabs once a day at bedtime         . sertraline (ZOLOFT) 100 MG tablet   Oral   Take 100 mg by mouth 2 (two) times daily. 2 tabs in the am         . simvastatin (ZOCOR) 40 MG tablet   Oral   Take 40 mg by mouth daily.         Marland Kitchen  traZODone (DESYREL) 100 MG tablet   Oral   Take 100 mg by mouth at bedtime as needed for sleep.           Allergies Asa and Sulfa antibiotics  Family History  Problem Relation Age of Onset  . Family history unknown: Yes    Social History History  Substance Use Topics  . Smoking status: Former Smoker    Types: Cigarettes    Quit date: 07/29/1975  . Smokeless tobacco: Not on file  . Alcohol Use: No    Review of Systems Constitutional: No fever/chills Eyes: No visual changes. ENT: No sore throat. Cardiovascular: Denies chest pain. Respiratory: Denies shortness of breath. Gastrointestinal: No abdominal pain.  No nausea, no vomiting.  No diarrhea.  No constipation. Genitourinary: Negative for dysuria. Musculoskeletal: Negative for back pain. Skin: Negative for rash. Neurological: Negative for headaches, focal weakness or numbness. Psychiatric:Increased auditory hallucinations.   10-point ROS otherwise  negative.  ____________________________________________   PHYSICAL EXAM:  VITAL SIGNS: ED Triage Vitals  Enc Vitals Group     BP 09/15/14 2112 127/83 mmHg     Pulse Rate 09/15/14 2112 96     Resp 09/15/14 2112 22     Temp 09/15/14 2112 98.2 F (36.8 C)     Temp Source 09/15/14 2112 Oral     SpO2 09/15/14 2112 98 %     Weight 09/15/14 2112 164 lb (74.39 kg)     Height 09/15/14 2112  (1.549 m)     Head Cir --      Peak Flow --      Pain Score --      Pain Loc --      Pain Edu? --      Excl. in GC? --     Constitutional: Alert and oriented to self and place. Well appearing and in no acute distress. Eyes: Conjunctivae are normal. PERRL. EOMI. Head: Atraumatic. Nose: No congestion/rhinnorhea. Mouth/Throat: Mucous membranes are moist.  Oropharynx non-erythematous. Neck: No stridor.   Cardiovascular: Normal rate, regular rhythm. Grossly normal heart sounds.  Good peripheral circulation. Respiratory: Normal respiratory effort.  No retractions. Lungs CTAB. Gastrointestinal: Soft and nontender. No distention. No abdominal bruits. No CVA tenderness. Musculoskeletal: No lower extremity tenderness nor edema.  No joint effusions. Neurologic:  Normal speech and language. No gross focal neurologic deficits are appreciated. Speech is normal. Skin:  Skin is warm, dry and intact. No rash noted. Psychiatric: Odd affect with inappropriate responses to questioning  ____________________________________________   LABS (all labs ordered are listed, but only abnormal results are displayed)  Labs Reviewed  COMPREHENSIVE METABOLIC PANEL - Abnormal; Notable for the following:    Glucose, Bld 127 (*)    All other components within normal limits  ACETAMINOPHEN LEVEL - Abnormal; Notable for the following:    Acetaminophen (Tylenol), Serum <10 (*)    All other components within normal limits  CBC WITH DIFFERENTIAL/PLATELET  ETHANOL  SALICYLATE LEVEL  URINALYSIS COMPLETEWITH MICROSCOPIC  ([facility] ONLY)  URINE DRUG SCREEN, QUALITATIVE ([facility] ONLY)   ____________________________________________  EKG   ____________________________________________  RADIOLOGY   ____________________________________________   PROCEDURES   ____________________________________________   INITIAL IMPRESSION / ASSESSMENT AND PLAN / ED COURSE  Pertinent labs & imaging results that were available during my care of the patient were reviewed by me and considered in my medical decision making (see chart for details).  Discussed case with Dr. Toni Amend who said will see patient in the morning.  Unknown if able to do  ECT tomorrow. will possibly have to wait until Friday. Presentation is consistent with previous exacerbations of depression with psychotic features.  Family updated about plan regarding Dr. Toni Amend. ____________________________________________   FINAL CLINICAL IMPRESSION(S) / ED DIAGNOSES  Acute psychosis. Initial visit.    Myrna Blazer, MD 09/15/14 [phone]

## 2014-09-15 NOTE — ED Notes (Signed)
Pt presents to ED with auditory hallucinations since this past weekend. Pt is seen by Dr. Gerre Pebbles for the past several years and was told by him to come to ED if symptoms continued or worsened. Denies SI or HI. Pt husband states pt has continued to remain active with laundry, eating, and cleaning dishes. Pt does not answer questions appropriately. When asked if hurting pt states she is a little bit but does not known where.

## 2014-09-15 NOTE — ED Notes (Signed)
ENVIRONMENTAL ASSESSMENT Potentially harmful objects out of patient reach: yes   Personal belongings secured: yes  Patient dressed in hospital provided attire only: yes   Plastic bags out of patient reach: yes  Patient care equipment (cords, cables, call bells, lines, and drains) shortened, removed, or accounted for:  Yes   Equipment and supplies removed from bottom of stretcher: yes Potentially toxic materials out of patient reach: yes  Sharps container removed or out of patient reach: yes

## 2014-09-15 NOTE — ED Notes (Signed)
BEHAVIORAL HEALTH ROUNDING Patient sleeping: no Patient alert and oriented: alert and confused Behavior appropriate:no ; If no, describe: thoughts are disassociated.  Patient is cooperative, calm but anxious. Nutrition and fluids offered: Yes Toileting and hygiene offered: Yes Sitter present:  No / NA.  Spouse at bedside.   Law enforcement present: No

## 2014-09-15 NOTE — Telephone Encounter (Signed)
I spoke to [place]. It doesn't sound like Jenna Dudley is obviously dangerous but it does sound like she is sick and having psychotic symptoms. We know that she does not like being in the hospital. It's not clear that she necessarily needs inpatient treatment. She has been asking to come to the hospital but Jenna Dudley wants to do what is best for her. I told him he should talk with her and assess whether there is really any dangerousness as far as self harm or dangerousness to anyone else involved. If so or she absolutely insists she can come to the emergency room and I will deal with it promptly. If not we will put her down for ECT on Friday. I don't think we will be able to fit her on to the schedule for tomorrow. I asked him to please give [place] a call back to let us know what he decides so we know how to schedule Friday area he agreed.

## 2014-09-16 DIAGNOSIS — F333 Major depressive disorder, recurrent, severe with psychotic symptoms: Secondary | ICD-10-CM

## 2014-09-16 DIAGNOSIS — F29 Unspecified psychosis not due to a substance or known physiological condition: Secondary | ICD-10-CM | POA: Diagnosis not present

## 2014-09-16 LAB — URINE DRUG SCREEN, QUALITATIVE (ARMC ONLY)
AMPHETAMINES, UR SCREEN: NOT DETECTED
BENZODIAZEPINE, UR SCRN: POSITIVE — AB
Barbiturates, Ur Screen: NOT DETECTED
Cannabinoid 50 Ng, Ur ~~LOC~~: NOT DETECTED
Cocaine Metabolite,Ur ~~LOC~~: NOT DETECTED
MDMA (Ecstasy)Ur Screen: NOT DETECTED
Methadone Scn, Ur: NOT DETECTED
OPIATE, UR SCREEN: NOT DETECTED
Phencyclidine (PCP) Ur S: NOT DETECTED
TRICYCLIC, UR SCREEN: POSITIVE — AB

## 2014-09-16 LAB — URINALYSIS COMPLETE WITH MICROSCOPIC (ARMC ONLY)
BILIRUBIN URINE: NEGATIVE
Bacteria, UA: NONE SEEN
Glucose, UA: NEGATIVE mg/dL
HGB URINE DIPSTICK: NEGATIVE
Nitrite: NEGATIVE
PH: 5 (ref 5.0–8.0)
PROTEIN: 30 mg/dL — AB
SPECIFIC GRAVITY, URINE: 1.02 (ref 1.005–1.030)
SQUAMOUS EPITHELIAL / LPF: NONE SEEN

## 2014-09-16 LAB — GLUCOSE, CAPILLARY: Glucose-Capillary: 104 mg/dL — ABNORMAL HIGH (ref 65–99)

## 2014-09-16 MED ORDER — LORAZEPAM 1 MG PO TABS
ORAL_TABLET | ORAL | Status: AC
  Administered 2014-09-16: 1 mg via ORAL
  Filled 2014-09-16: qty 1

## 2014-09-16 MED ORDER — LORAZEPAM 1 MG PO TABS
1.0000 mg | ORAL_TABLET | Freq: Once | ORAL | Status: AC
  Administered 2014-09-16: 1 mg via ORAL

## 2014-09-16 MED ORDER — SIMVASTATIN 40 MG PO TABS
40.0000 mg | ORAL_TABLET | Freq: Every day | ORAL | Status: DC
  Administered 2014-09-16: 40 mg via ORAL

## 2014-09-16 MED ORDER — LOSARTAN POTASSIUM 25 MG PO TABS
ORAL_TABLET | ORAL | Status: AC
  Filled 2014-09-16: qty 2

## 2014-09-16 MED ORDER — SERTRALINE HCL 100 MG PO TABS
100.0000 mg | ORAL_TABLET | Freq: Every day | ORAL | Status: DC
  Administered 2014-09-16: 100 mg via ORAL

## 2014-09-16 MED ORDER — POTASSIUM CHLORIDE CRYS ER 20 MEQ PO TBCR
EXTENDED_RELEASE_TABLET | ORAL | Status: AC
  Filled 2014-09-16: qty 1

## 2014-09-16 MED ORDER — QUETIAPINE FUMARATE 25 MG PO TABS: 300.0000 mg | ORAL_TABLET | Freq: Every day | ORAL | Status: DC

## 2014-09-16 MED ORDER — SERTRALINE HCL 100 MG PO TABS
ORAL_TABLET | ORAL | Status: AC
  Filled 2014-09-16: qty 1

## 2014-09-16 MED ORDER — POTASSIUM CHLORIDE CRYS ER 20 MEQ PO TBCR
20.0000 meq | EXTENDED_RELEASE_TABLET | Freq: Every day | ORAL | Status: DC
  Administered 2014-09-16: 20 meq via ORAL

## 2014-09-16 MED ORDER — LOSARTAN POTASSIUM 25 MG PO TABS
50.0000 mg | ORAL_TABLET | Freq: Every day | ORAL | Status: DC
  Administered 2014-09-16: 50 mg via ORAL

## 2014-09-16 MED ORDER — MIRTAZAPINE 15 MG PO TABS
45.0000 mg | ORAL_TABLET | Freq: Every day | ORAL | Status: DC
  Filled 2014-09-16 (×2): qty 3

## 2014-09-16 MED ORDER — HYDROCHLOROTHIAZIDE 12.5 MG PO CAPS
ORAL_CAPSULE | ORAL | Status: AC
  Filled 2014-09-16: qty 1

## 2014-09-16 MED ORDER — HYDROCHLOROTHIAZIDE 12.5 MG PO CAPS
12.5000 mg | ORAL_CAPSULE | Freq: Every day | ORAL | Status: DC
  Administered 2014-09-16: 12.5 mg via ORAL

## 2014-09-16 MED ORDER — SIMVASTATIN 40 MG PO TABS
ORAL_TABLET | ORAL | Status: AC
  Filled 2014-09-16: qty 1

## 2014-09-16 NOTE — ED Notes (Signed)

## 2014-09-16 NOTE — ED Notes (Signed)
pts husband and intake at bedside with patient at this time.

## 2014-09-16 NOTE — ED Notes (Signed)
BEHAVIORAL HEALTH ROUNDING Patient sleeping: No. Patient alert and oriented: no Behavior appropriate: Yes.   Nutrition and fluids offered: Yes  Toileting and hygiene offered: Yes  Sitter present: yes Law enforcement present: Yes   

## 2014-09-16 NOTE — ED Notes (Signed)
BEHAVIORAL HEALTH ROUNDING Patient sleeping: No. Patient alert and oriented: yes Behavior appropriate: Yes.  ; If no, describe:  Nutrition and fluids offered: Yes  Toileting and hygiene offered: Yes  Sitter present: yes Law enforcement present: Yes  

## 2014-09-16 NOTE — ED Notes (Signed)
BEHAVIORAL HEALTH ROUNDING Patient sleeping: No. Patient alert and oriented: no Behavior appropriate: Yes.  ; If no, describe:  Nutrition and fluids offered: Yes  Toileting and hygiene offered: Yes  Sitter present: yes Law enforcement present: Yes  

## 2014-09-16 NOTE — ED Notes (Signed)
BEHAVIORAL HEALTH ROUNDING Patient sleeping: No. Patient alert and oriented: yes Behavior appropriate: Yes.  ; If no, describe:  Nutrition and fluids offered: Yes Toileting and hygiene offered: Yes  Sitter present: yes Law enforcement present: Yes ODS  

## 2014-09-16 NOTE — ED Notes (Signed)
VOL/ No consult ordered 

## 2014-09-16 NOTE — ED Notes (Signed)

## 2014-09-16 NOTE — ED Notes (Signed)
Report given to Loleta Dicker, RN by Sylvan Cheese, RN Patient currently sleeping.  Respirations even and nonlabored.  Door open, can see patient from nurse's station.

## 2014-09-16 NOTE — ED Notes (Signed)
Husband, Jenna Dudley, called to check on patient. Asking to be called by intake. [phone].

## 2014-09-16 NOTE — ED Notes (Signed)
BEHAVIORAL HEALTH ROUNDING Patient sleeping: Yes.   Patient alert and oriented: no Behavior appropriate: Yes.  ; If no, describe:  Nutrition and fluids offered: No Toileting and hygiene offered: No Sitter present: yes Law enforcement present: Yes  

## 2014-09-16 NOTE — ED Notes (Signed)
Noted order for UA and UDS from last night at 2117,  Called lab to clarify.  They do not have any urine in lab from this patient.

## 2014-09-16 NOTE — ED Provider Notes (Signed)
-----------------------------------------   5:44 AM on 09/16/2014 -----------------------------------------   BP 157/57 mmHg  Pulse 97  Temp(Src) 98.2 F (36.8 C) (Oral)  Resp 22  Ht 5\' 1"  (1.549 m)  Wt 164 lb (74.39 kg)  BMI 31.00 kg/m2  SpO2 98%  LMP  (LMP Unknown)  The patient had no acute events since last update.  Calm and cooperative at this time.  Disposition is pending per Psychiatry/Behavioral Medicine team recommendations.     Irean Hong, MD 09/16/14 [phone]

## 2014-09-16 NOTE — ED Notes (Signed)
Husband is going to leave.  Pt took upper denture out and placed in cup wit label.

## 2014-09-16 NOTE — ED Notes (Signed)
Pt ambulated in hallway, appearing confused and scared. Pt ambulated to bathroom, unable to obtain specimen at this time. Pt ambulated back to room, pt states "I'm scared". Pt reassured she is safe here, pt placed back in bed. MD notified of pt's behavior, verbal orders for 1mg  PO ativan given at this time.

## 2014-09-16 NOTE — ED Notes (Signed)
BEHAVIORAL HEALTH ROUNDING Patient sleeping: YES Patient alert and oriented: Sleeping Behavior appropriate: Sleeping Describe behavior: No inappropriate or unacceptable behaviors noted at this time.  Nutrition and fluids offered: Sleeping Toileting and hygiene offered: Sleeping Sitter present: Behavioral tech rounding every 15 minutes on patient to ensure safety.  Law enforcement present: Water quality scientist: [employer]

## 2014-09-16 NOTE — ED Provider Notes (Signed)
-----------------------------------------   11:27 PM on 09/16/2014 -----------------------------------------   BP 130/80 mmHg  Pulse 91  Temp(Src) 98.8 F (37.1 C) (Oral)  Resp 20  Ht 5\' 1"  (1.549 m)  Wt 164 lb (74.39 kg)  BMI 31.00 kg/m2  SpO2 99%  LMP  (LMP Unknown)  The patient had no acute events since last update.  Calm and cooperative at this time.  To be admitted to inpatient psychiatry.   Myrna Blazer, MD 09/16/14 [phone]

## 2014-09-16 NOTE — Consult Note (Signed)
[place] [facility] Consult   Reason for Consult:  This is a 79 year old woman with a history of severe recurrent depression with psychotic features who is usually followed by me for ECT. Her husband brought her into the emergency room because of worsening symptoms. Referring Physician:  Clearnce Hasten Patient Identification: Jenna Dudley MRN:  [id number] Principal Diagnosis: Severe recurrent major depression with psychotic features Diagnosis:   Patient Active Problem List   Diagnosis Date Noted  . Hypertension [I10] 09/16/2014  . Severe recurrent major depression with psychotic features [F33.3]     Total Time spent with patient: 45 minutes  Subjective:   Jenna Dudley is a 79 y.o. female patient admitted with "I know you don't want me to sleep". Patient is disorganized in her thinking and unable to give coherent history. I did get better history from her husband a couple days ago see note below.Marland Kitchen  HPI:  This is a 79 year old woman with a history of recurrent major depression. Previously I had been following her for maintenance ECT treatments and when she was getting them once every 2 weeks she was staying quite stable for extended periods of time. About a month ago she stopped coming to her ECT treatments. Despite several efforts to contact her by phone the husband would not force her to come in for treatment. I spoke to the husband couple days ago when he reported that she had been decompensating badly. Not communicating rationally. Talking about extreme and nonsensical guilt. Not really eating well. Unclear how much she's been taking her medication. Patient does not actually attempted to kill her self.  Past psychiatric history: Long history of severe recurrent depression with psychosis. When she gets depressed she has psychotic level guilt and becomes very disorganized and paranoid in her thinking. She responded uniquely well to ECT and has been doing well on maintenance ECT treatment.  Does have a past history of suicide attempts no history of violence.  Medical history: Patient has high blood pressure and dyslipidemia. She appears to probably have had a stroke at one point and has had chronic left-sided facial weakness but she is ambulatory and able to use both of her hands appropriately.  Social history: She is married and her husband is knowledgeable about her illness and devoted. She has 5 adult daughters who she stays in contact with to varying degrees. Overall a supportive and safe family.  Family history: Unknown  Substance abuse history: Noncontributory  Current medication: Remeron 45 mg at night Zoloft 100 mg a day Seroquel 300 mg at night and Zyprexa 20 mg at night as well as her blood pressure medicine and cholesterol medicine HPI Elements:   Quality:  Disorganized thinking excessive guilt not eating not caring for herself. Severity:  Severe and ultimately potentially life threatening. Timing:  Worse over the past week. Duration:  Getting much worse over the past month as part of a chronic problem. Context:  Noncompliance with recommended treatment.  Past Medical History:  Past Medical History  Diagnosis Date  . Hypertension   . Hyperlipemia 07/29/14  . Chronic low back pain 07/29/14  . Spinal stenosis 07/29/14  . GERD (gastroesophageal reflux disease)   . Depression   . Esophageal spasm 07/29/14  . Bilateral carpal tunnel syndrome 07/27/14  . Degenerative arthritis of lumbar spine 07/27/14    Past Surgical History  Procedure Laterality Date  . Hip surgery Right   . Replacement total knee bilateral Bilateral 07/27/14  . Inner ear surgery Right  Family History:  Family History  Problem Relation Age of Onset  . Family history unknown: Yes   Social History:  History  Alcohol Use No     History  Drug Use No    History   Social History  . Marital Status: Single    Spouse Name: N/A  . Number of Children: N/A  . Years of Education: N/A   Social  History Main Topics  . Smoking status: Former Smoker    Types: Cigarettes    Quit date: 07/29/1975  . Smokeless tobacco: Not on file  . Alcohol Use: No  . Drug Use: No  . Sexual Activity: No     Comment: postmenopause   Other Topics Concern  . None   Social History Narrative   Additional Social History:                          Allergies:   Allergies  Allergen Reactions  . Asa [Aspirin]   . Sulfa Antibiotics     Labs:  Results for orders placed or performed during the hospital encounter of 09/15/14 (from the past 48 hour(s))  CBC WITH DIFFERENTIAL     Status: None   Collection Time: 09/15/14  9:27 PM  Result Value Ref Range   WBC 7.4 3.6 - 11.0 K/uL   RBC 4.71 3.80 - 5.20 MIL/uL   Hemoglobin 13.5 12.0 - 16.0 g/dL   HCT 40.9 35.0 - 47.0 %   MCV 86.7 80.0 - 100.0 fL   MCH 28.6 26.0 - 34.0 pg   MCHC 33.0 32.0 - 36.0 g/dL   RDW 14.5 11.5 - 14.5 %   Platelets 319 150 - 440 K/uL   Neutrophils Relative % 63 %   Lymphocytes Relative 22 %   Monocytes Relative 10 %   Eosinophils Relative 4 %   Basophils Relative 1 %   Neutro Abs 4.7 1.4 - 6.5 K/uL   Lymphs Abs 1.6 1.0 - 3.6 K/uL   Monocytes Absolute 0.7 0.2 - 0.9 K/uL   Eosinophils Absolute 0.3 0 - 0.7 K/uL   Basophils Absolute 0.1 0 - 0.1 K/uL  Comprehensive metabolic panel     Status: Abnormal   Collection Time: 09/15/14  9:27 PM  Result Value Ref Range   Sodium 138 135 - 145 mmol/L   Potassium 3.9 3.5 - 5.1 mmol/L   Chloride 104 101 - 111 mmol/L   CO2 25 22 - 32 mmol/L   Glucose, Bld 127 (H) 65 - 99 mg/dL   BUN 20 6 - 20 mg/dL   Creatinine, Ser 0.83 0.44 - 1.00 mg/dL   Calcium 9.4 8.9 - 10.3 mg/dL   Total Protein 7.5 6.5 - 8.1 g/dL   Albumin 4.4 3.5 - 5.0 g/dL   AST 33 15 - 41 U/L   ALT 23 14 - 54 U/L   Alkaline Phosphatase 86 38 - 126 U/L   Total Bilirubin 0.5 0.3 - 1.2 mg/dL   GFR calc non Af Amer >60 >60 mL/min   GFR calc Af Amer >60 >60 mL/min    Comment: (NOTE) The eGFR has been calculated  using the CKD EPI equation. This calculation has not been validated in all clinical situations. eGFR's persistently <60 mL/min signify possible Chronic Kidney Disease.    Anion gap 9 5 - 15  Ethanol     Status: None   Collection Time: 09/15/14  9:27 PM  Result Value Ref Range   Alcohol,  Ethyl (B) <5 <5 mg/dL    Comment:        LOWEST DETECTABLE LIMIT FOR SERUM ALCOHOL IS 5 mg/dL FOR MEDICAL PURPOSES ONLY   Acetaminophen level     Status: Abnormal   Collection Time: 09/15/14  9:27 PM  Result Value Ref Range   Acetaminophen (Tylenol), Serum <10 (L) 10 - 30 ug/mL    Comment:        THERAPEUTIC CONCENTRATIONS VARY SIGNIFICANTLY. A RANGE OF 10-30 ug/mL MAY BE AN EFFECTIVE CONCENTRATION FOR MANY PATIENTS. HOWEVER, SOME ARE BEST TREATED AT CONCENTRATIONS OUTSIDE THIS RANGE. ACETAMINOPHEN CONCENTRATIONS >150 ug/mL AT 4 HOURS AFTER INGESTION AND >50 ug/mL AT 12 HOURS AFTER INGESTION ARE OFTEN ASSOCIATED WITH TOXIC REACTIONS.   Salicylate level     Status: None   Collection Time: 09/15/14  9:27 PM  Result Value Ref Range   Salicylate Lvl <3.9 2.8 - 30.0 mg/dL    Vitals: Blood pressure 142/67, pulse 91, temperature 98.2 F (36.8 C), temperature source Oral, resp. rate 20, height _0  (1.549 m), weight 74.39 kg (164 lb), SpO2 97 %.  Risk to Self: Is patient at risk for suicide?: No Risk to Others:   Prior Inpatient Therapy:   Prior Outpatient Therapy:    Current Facility-Administered Medications  Medication Dose Route Frequency Provider Last Rate Last Dose  . hydrochlorothiazide (MICROZIDE) capsule 12.5 mg  12.5 mg Oral Daily Gonzella Lex, MD      . losartan (COZAAR) tablet 50 mg  50 mg Oral Daily Gonzella Lex, MD      . mirtazapine (REMERON) tablet 45 mg  45 mg Oral QHS John T Clapacs, MD      . potassium chloride SA (K-DUR,KLOR-CON) CR tablet 20 mEq  20 mEq Oral Daily John T Clapacs, MD      . QUEtiapine (SEROQUEL) tablet 300 mg  300 mg Oral QHS John T Clapacs, MD       . sertraline (ZOLOFT) tablet 100 mg  100 mg Oral Daily Gonzella Lex, MD      . simvastatin (ZOCOR) tablet 40 mg  40 mg Oral q1800 Gonzella Lex, MD       Current Outpatient Prescriptions  Medication Sig Dispense Refill  . losartan-hydrochlorothiazide (HYZAAR) 50-12.5 MG per tablet Take 1 tablet by mouth daily.    . mirtazapine (REMERON) 45 MG tablet Take 45 mg by mouth at bedtime.    Marland Kitchen OLANZapine (ZYPREXA) 20 MG tablet Take 20 mg by mouth at bedtime.    Marland Kitchen omeprazole (PRILOSEC) 20 MG capsule Take 20 mg by mouth daily.    . potassium chloride SA (K-DUR,KLOR-CON) 20 MEQ tablet Take 20 mEq by mouth daily.    . QUEtiapine (SEROQUEL) 300 MG tablet Take 300 mg by mouth at bedtime.    . sertraline (ZOLOFT) 100 MG tablet Take 200 mg by mouth daily. 2 tabs in the am    . simvastatin (ZOCOR) 40 MG tablet Take 40 mg by mouth at bedtime.       Musculoskeletal: Strength & Muscle Tone: decreased Gait & Station: normal Patient leans: N/A  Psychiatric Specialty Exam: Physical Exam  Constitutional: She appears well-developed and well-nourished.  HENT:  Head: Normocephalic and atraumatic.    Eyes: Conjunctivae are normal. Pupils are equal, round, and reactive to light.  Neck: Normal range of motion.  Cardiovascular: Normal heart sounds.   Respiratory: Effort normal.  GI: Soft.  Musculoskeletal: Normal range of motion.  Neurological: She is alert.  Skin: Skin  is warm and dry.  Psychiatric: Her mood appears anxious. Her affect is blunt. Her speech is delayed. She is withdrawn and actively hallucinating. Thought content is paranoid. Cognition and memory are impaired. She expresses inappropriate judgment. She exhibits a depressed mood. She is noncommunicative. She exhibits abnormal recent memory and abnormal remote memory.    Review of Systems  Constitutional: Negative.   HENT: Negative.   Eyes: Negative.   Respiratory: Negative.   Cardiovascular: Negative.   Gastrointestinal: Negative.     Musculoskeletal: Negative.   Skin: Negative.   Neurological: Negative.   Psychiatric/Behavioral: Positive for depression, hallucinations and memory loss. Negative for suicidal ideas and substance abuse. The patient is nervous/anxious and has insomnia.     Blood pressure 142/67, pulse 91, temperature 98.2 F (36.8 C), temperature source Oral, resp. rate 20, height _0  (1.549 m), weight 74.39 kg (164 lb), SpO2 97 %.Body mass index is 31 kg/(m^2).  General Appearance: Disheveled and Guarded  Eye Contact::  Poor  Speech:  Slow  Volume:  Decreased  Mood:  Anxious and Depressed  Affect:  Depressed  Thought Process:  Disorganized  Orientation:  Negative  Thought Content:  Delusions  Suicidal Thoughts:  No  Homicidal Thoughts:  No  Memory:  Immediate;   Good Recent;   Poor Remote;   Poor  Judgement:  Impaired  Insight:  Shallow  Psychomotor Activity:  Decreased  Concentration:  Poor  Recall:  Poor  Fund of Knowledge:Fair  Language: Fair  Akathisia:  No  Handed:  Right  AIMS (if indicated):     Assets:  Financial Resources/Insurance Housing Intimacy Social Support  ADL's:  Intact  Cognition: Impaired,  Moderate  Sleep:      Medical Decision Making: Review of Psycho-Social Stressors (1), Review or order clinical lab tests (1), Established Problem, Worsening (2), Review of Medication Regimen & Side Effects (2) and Review of New Medication or Change in Dosage (2)  Treatment Plan Summary: Medication management and Plan Patient has clearly been decompensating for several weeks. I had spoken to her husband on the phone earlier in the week and told him that if he could possibly manage her at home safely for another few days we would see her for ECT treatment on Friday. I know that patient doesn't like to be admitted to the psychiatry ward if it can be avoided. I told him that he should only bring her to the emergency room if things became unmanageable. He brought her here yesterday  evening so presumably things of gotten much worse. She will be admitted to the psychiatry ward. Next available ECT session will be Friday and she is already on the schedule for it. Continue outpatient medication. Continue monitoring. Could possibly need a sitter depending on how well she is taking care of her ADLs but at baseline she is perfectly capable of taking care of herself.  Plan:  Recommend psychiatric Inpatient admission when medically cleared. Disposition: A she'll be placed under IVC and will be admitted to the psychiatry ward on her current [place] 09/16/2014 3:47 PM

## 2014-09-16 NOTE — ED Notes (Signed)
Assisted pt out of bed, her bed linens are soiled with urine.   Pt ambulated to bathroom but had 2-3 very loose stools,  She was cleaned up but had to sit back down and she produced another very soft but formed stool.   Pt was cleaned, her hands were washed and she was assisted into clean paper scrubs.

## 2014-09-16 NOTE — ED Notes (Signed)
BEHAVIORAL HEALTH ROUNDING Patient sleeping: Yes.   Patient alert and oriented: not applicable Behavior appropriate: Yes.  ; If no, describe:  Nutrition and fluids offered: Yes  Toileting and hygiene offered: Yes  Sitter present: yes Law enforcement present: Yes  

## 2014-09-16 NOTE — ED Notes (Signed)
BEHAVIORAL HEALTH ROUNDING Patient sleeping: Yes.   Patient alert and oriented: no Behavior appropriate: Yes.  ;  Nutrition and fluids offered: No Toileting and hygiene offered: No Sitter present: yes Law enforcement present: Yes

## 2014-09-16 NOTE — ED Notes (Signed)
Pt asleep,  Diaphoretic.  She would not respond to voice, responded only to painful stimuli.  She voices no c/o.  Once awakened she is at baseline,vitals and CBG stable.pt was medicated as ordered.  Will cont. To monitor.

## 2014-09-16 NOTE — ED Notes (Signed)
Resumed care from [facility].  Pt alert.

## 2014-09-16 NOTE — BH Assessment (Signed)
Assessment Note  Jenna Dudley is an 79 y.o. female who presents to the ER via her husband due to to concerns about her behaviors and mood. Husband states, the pt. Has started decompensating for the last several days. She has stop eating and drinking. She has had an increase of confusion and rambling. Husband also reports, she has been responding to internal stimuli. She is seeing things and talking to people that aren't present.  During the interview the pt. Was unable to answer writer questions. She would stare at the writer and the answers she gave to the questions, didn't relate or connect. Pt. Is currently under the care of Dr. Toni Amend, on a Outpatient setting. She receives ECT Treatment. Per husband, she has missed two ECT appointments and believes her current presentation is a result of it. Pt. Has dealt with depression for the last 7 to 8 years. Prior, to that, the was active in the community and heavily involved with her family. She and her husband has 3 adult children. At this time, it's just the pt. And her husband in the home.   Axis I: Major Depression, Recurrent severe, with psychotic features Axis III:  Past Medical History  Diagnosis Date  . Hypertension   . Hyperlipemia 07/29/14  . Chronic low back pain 07/29/14  . Spinal stenosis 07/29/14  . GERD (gastroesophageal reflux disease)   . Depression   . Esophageal spasm 07/29/14  . Bilateral carpal tunnel syndrome 07/27/14  . Degenerative arthritis of lumbar spine 07/27/14   Axis IV: occupational problems, other psychosocial or environmental problems, problems related to social environment and problems with primary support group  Past Medical History:  Past Medical History  Diagnosis Date  . Hypertension   . Hyperlipemia 07/29/14  . Chronic low back pain 07/29/14  . Spinal stenosis 07/29/14  . GERD (gastroesophageal reflux disease)   . Depression   . Esophageal spasm 07/29/14  . Bilateral carpal tunnel syndrome 07/27/14  . Degenerative  arthritis of lumbar spine 07/27/14    Past Surgical History  Procedure Laterality Date  . Hip surgery Right   . Replacement total knee bilateral Bilateral 07/27/14  . Inner ear surgery Right     Family History:  Family History  Problem Relation Age of Onset  . Family history unknown: Yes    Social History:  reports that she quit smoking about 39 years ago. Her smoking use included Cigarettes. She does not have any smokeless tobacco history on file. She reports that she does not drink alcohol or use illicit drugs.  Additional Social History:  Alcohol / Drug Use Pain Medications: None Reported Prescriptions: None Reported Over the Counter: None Reported History of alcohol / drug use?: No history of alcohol / drug abuse Longest period of sobriety (when/how long):  (None Reported) Negative Consequences of Use:  (None Reported) Withdrawal Symptoms:  (None Reported)  CIWA: CIWA-Ar BP: (!) 142/67 mmHg Pulse Rate: 91 COWS:    Allergies:  Allergies  Allergen Reactions  . Asa [Aspirin]   . Sulfa Antibiotics     Home Medications:  (Not in a hospital admission)  OB/GYN Status:  No LMP recorded (lmp unknown). Patient is postmenopausal.  General Assessment Data Location of Assessment: [facility] ED TTS Assessment: In system Is this a Tele or Face-to-Face Assessment?: Face-to-Face Is this an Initial Assessment or a Re-assessment for this encounter?: Initial Assessment Marital status: Married [place] name: n/a Is patient pregnant?: No Living Arrangements: Spouse/significant other Can pt return to current living  arrangement?: Yes Admission Status: Voluntary Is patient capable of signing voluntary admission?: Yes Referral Source: Self/Family/Friend Insurance type: Medicare  Medical Screening Exam [facility] Walk-in ONLY) Medical Exam completed: Yes  Crisis Care Plan Living Arrangements: Spouse/significant other Name of Psychiatrist: Dr. Toni Amend Name of Therapist: n/a  Education  Status Is patient currently in school?: No Current Grade: n/a Highest grade of school patient has completed: Unknown Name of school: n/a Contact person: n/a  Risk to self with the past 6 months Suicidal Ideation: No Has patient been a risk to self within the past 6 months prior to admission? : No Suicidal Intent: No Has patient had any suicidal intent within the past 6 months prior to admission? : No Is patient at risk for suicide?: No Suicidal Plan?: No Has patient had any suicidal plan within the past 6 months prior to admission? : No Access to Means: No What has been your use of drugs/alcohol within the last 12 months?: None Reported Previous Attempts/Gestures: No How many times?: 0 Other Self Harm Risks: None Reported Triggers for Past Attempts: None known Intentional Self Injurious Behavior: None Family Suicide History: Unknown Recent stressful life event(s): Other (Comment) (None Reported) Persecutory voices/beliefs?: No Depression: Yes Depression Symptoms: Despondent, Insomnia, Fatigue, Isolating, Loss of interest in usual pleasures, Feeling worthless/self pity Substance abuse history and/or treatment for substance abuse?: No Suicide prevention information given to non-admitted patients: Not applicable  Risk to Others within the past 6 months Homicidal Ideation: No Does patient have any lifetime risk of violence toward others beyond the six months prior to admission? : No Thoughts of Harm to Others: No Current Homicidal Intent: No Current Homicidal Plan: No Access to Homicidal Means: No Identified Victim: None Reported History of harm to others?: No Assessment of Violence: None Noted Violent Behavior Description: None Reported Does patient have access to weapons?: No Criminal Charges Pending?: No Does patient have a court date: No Is patient on probation?: No  Psychosis Hallucinations: Auditory Delusions: Persecutory, Unspecified  Mental Status  Report Appearance/Hygiene: Unremarkable, In scrubs, In hospital gown Eye Contact: Poor Motor Activity: Unable to assess (Pt. was laying in bed) Speech: Word salad, Slow, Soft Level of Consciousness: Drowsy, Restless Mood: Anxious, Depressed, Sad, Despair Affect: Depressed, Sad, Flat Anxiety Level: Moderate Thought Processes: Coherent, Relevant Judgement: Impaired Orientation: Not oriented Obsessive Compulsive Thoughts/Behaviors: Unable to Assess  Cognitive Functioning Concentration: Decreased (Per husband reports) Memory: Remote Impaired, Recent Impaired IQ: Average Insight: Poor Impulse Control: Poor Appetite: Poor Weight Loss: 0 Weight Gain: 0 Sleep: Decreased Total Hours of Sleep: 3 Vegetative Symptoms: None  ADLScreening [facility] Assessment Services) Patient's cognitive ability adequate to safely complete daily activities?: Yes Patient able to express need for assistance with ADLs?: Yes Independently performs ADLs?: Yes (appropriate for developmental age)  Prior Inpatient Therapy Prior Inpatient Therapy: Yes Prior Therapy Dates: 04/2014 Prior Therapy Facilty/Provider(s): [facility] Reason for Treatment: Major Depression  Prior Outpatient Therapy Prior Outpatient Therapy: Yes Prior Therapy Dates: Current (Receives ECT) Prior Therapy Facilty/Provider(s): [facility] Reason for Treatment: Major Depression Does patient have an ACCT team?: No Does patient have Intensive In-House Services?  : No Does patient have [facility] services? : No Does patient have P4CC services?: No  ADL Screening (condition at time of admission) Patient's cognitive ability adequate to safely complete daily activities?: Yes Patient able to express need for assistance with ADLs?: Yes Independently performs ADLs?: Yes (appropriate for developmental age)       Abuse/Neglect Assessment (Assessment to be complete while patient is  alone) Physical Abuse: Denies Verbal Abuse:  Denies Sexual Abuse: Denies Exploitation of patient/patient's resources: Denies Self-Neglect: Denies Values / Beliefs Cultural Requests During Hospitalization: None Spiritual Requests During Hospitalization: None Consults Spiritual Care Consult Needed: No Social Work Consult Needed: No      Additional Information 1:1 In Past 12 Months?: No CIRT Risk: No Elopement Risk: No Does patient have medical clearance?: Yes  Child/Adolescent Assessment Running Away Risk: Denies (Pt. is an adult)  Disposition:  Disposition Initial Assessment Completed for this Encounter: Yes Disposition of Patient: Inpatient treatment program Type of inpatient treatment program: Adult  On Site Evaluation by:   Reviewed with Physician:     Lilyan Gilford, MS, LCAS, LPC, NCC, CCSI 09/16/2014 5:54 PM

## 2014-09-16 NOTE — ED Notes (Signed)
Talked with Florentina Addison, RN.  She states that when she took patient to the bathroom earlier today the patient had loose stools and she was unable to obtain urine.

## 2014-09-17 ENCOUNTER — Inpatient Hospital Stay: Payer: Medicare Other

## 2014-09-17 ENCOUNTER — Encounter: Payer: Self-pay | Admitting: Behavioral Health

## 2014-09-17 ENCOUNTER — Inpatient Hospital Stay
Admit: 2014-09-17 | Discharge: 2014-09-26 | DRG: 885 | Disposition: A | Discharge status: Home / Self Care | Payer: Medicare Other | Attending: Psychiatry | Admitting: Psychiatry

## 2014-09-17 DIAGNOSIS — Z9889 Other specified postprocedural states: Secondary | ICD-10-CM | POA: Diagnosis not present

## 2014-09-17 DIAGNOSIS — Z9119 Patient's noncompliance with other medical treatment and regimen: Secondary | ICD-10-CM | POA: Diagnosis present

## 2014-09-17 DIAGNOSIS — Z87891 Personal history of nicotine dependence: Secondary | ICD-10-CM | POA: Diagnosis not present

## 2014-09-17 DIAGNOSIS — Z888 Allergy status to other drugs, medicaments and biological substances status: Secondary | ICD-10-CM | POA: Diagnosis not present

## 2014-09-17 DIAGNOSIS — R45851 Suicidal ideations: Secondary | ICD-10-CM | POA: Diagnosis present

## 2014-09-17 DIAGNOSIS — M4686 Other specified inflammatory spondylopathies, lumbar region: Secondary | ICD-10-CM | POA: Diagnosis present

## 2014-09-17 DIAGNOSIS — R200 Anesthesia of skin: Secondary | ICD-10-CM

## 2014-09-17 DIAGNOSIS — Z9114 Patient's other noncompliance with medication regimen: Secondary | ICD-10-CM | POA: Diagnosis present

## 2014-09-17 DIAGNOSIS — F29 Unspecified psychosis not due to a substance or known physiological condition: Secondary | ICD-10-CM | POA: Diagnosis not present

## 2014-09-17 DIAGNOSIS — F419 Anxiety disorder, unspecified: Secondary | ICD-10-CM | POA: Diagnosis present

## 2014-09-17 DIAGNOSIS — Z8673 Personal history of transient ischemic attack (TIA), and cerebral infarction without residual deficits: Secondary | ICD-10-CM | POA: Diagnosis not present

## 2014-09-17 DIAGNOSIS — Z96653 Presence of artificial knee joint, bilateral: Secondary | ICD-10-CM | POA: Diagnosis present

## 2014-09-17 DIAGNOSIS — M549 Dorsalgia, unspecified: Secondary | ICD-10-CM

## 2014-09-17 DIAGNOSIS — N390 Urinary tract infection, site not specified: Secondary | ICD-10-CM | POA: Diagnosis present

## 2014-09-17 DIAGNOSIS — R030 Elevated blood-pressure reading, without diagnosis of hypertension: Secondary | ICD-10-CM | POA: Diagnosis present

## 2014-09-17 DIAGNOSIS — K219 Gastro-esophageal reflux disease without esophagitis: Secondary | ICD-10-CM | POA: Diagnosis present

## 2014-09-17 DIAGNOSIS — F22 Delusional disorders: Secondary | ICD-10-CM | POA: Diagnosis present

## 2014-09-17 DIAGNOSIS — M545 Low back pain: Secondary | ICD-10-CM | POA: Diagnosis present

## 2014-09-17 DIAGNOSIS — Z882 Allergy status to sulfonamides status: Secondary | ICD-10-CM

## 2014-09-17 DIAGNOSIS — F333 Major depressive disorder, recurrent, severe with psychotic symptoms: Principal | ICD-10-CM | POA: Diagnosis present

## 2014-09-17 DIAGNOSIS — G8929 Other chronic pain: Secondary | ICD-10-CM | POA: Diagnosis present

## 2014-09-17 DIAGNOSIS — I10 Essential (primary) hypertension: Secondary | ICD-10-CM

## 2014-09-17 DIAGNOSIS — E785 Hyperlipidemia, unspecified: Secondary | ICD-10-CM | POA: Diagnosis present

## 2014-09-17 DIAGNOSIS — Z79899 Other long term (current) drug therapy: Secondary | ICD-10-CM | POA: Diagnosis not present

## 2014-09-17 DIAGNOSIS — R4182 Altered mental status, unspecified: Secondary | ICD-10-CM

## 2014-09-17 HISTORY — DX: Gastro-esophageal reflux disease without esophagitis: K21.9

## 2014-09-17 MED ORDER — ACETAMINOPHEN 325 MG PO TABS
650.0000 mg | ORAL_TABLET | Freq: Four times a day (QID) | ORAL | Status: DC | PRN
  Administered 2014-09-17: 650 mg via ORAL
  Filled 2014-09-17: qty 2

## 2014-09-17 MED ORDER — CEFTRIAXONE SODIUM 1 G IJ SOLR
1.0000 g | Freq: Once | INTRAMUSCULAR | Status: AC
  Administered 2014-09-17: 1 g via INTRAMUSCULAR

## 2014-09-17 MED ORDER — CEPHALEXIN 500 MG PO CAPS
ORAL_CAPSULE | ORAL | Status: AC
  Filled 2014-09-17: qty 1

## 2014-09-17 MED ORDER — CEPHALEXIN 500 MG PO CAPS
500.0000 mg | ORAL_CAPSULE | Freq: Four times a day (QID) | ORAL | Status: DC
  Administered 2014-09-17: 500 mg via ORAL

## 2014-09-17 MED ORDER — SERTRALINE HCL 100 MG PO TABS
100.0000 mg | ORAL_TABLET | Freq: Every day | ORAL | Status: DC
  Administered 2014-09-17: 100 mg via ORAL
  Filled 2014-09-17 (×2): qty 1

## 2014-09-17 MED ORDER — CIPROFLOXACIN HCL 500 MG PO TABS
500.0000 mg | ORAL_TABLET | Freq: Two times a day (BID) | ORAL | Status: DC
  Administered 2014-09-17 – 2014-09-20 (×5): 500 mg via ORAL
  Filled 2014-09-17 (×7): qty 1

## 2014-09-17 MED ORDER — MIRTAZAPINE 30 MG PO TABS
45.0000 mg | ORAL_TABLET | Freq: Every day | ORAL | Status: DC
  Administered 2014-09-17 – 2014-09-25 (×8): 45 mg via ORAL
  Filled 2014-09-17 (×8): qty 1

## 2014-09-17 MED ORDER — LIDOCAINE HCL (PF) 1 % IJ SOLN
INTRAMUSCULAR | Status: AC
  Administered 2014-09-17: 01:00:00
  Filled 2014-09-17: qty 5

## 2014-09-17 MED ORDER — MAGNESIUM HYDROXIDE 400 MG/5ML PO SUSP: 30.0000 mL | Freq: Every day | ORAL | Status: DC | PRN

## 2014-09-17 MED ORDER — OXYCODONE-ACETAMINOPHEN 5-325 MG PO TABS: 1.0000 | ORAL_TABLET | ORAL | Status: DC | PRN

## 2014-09-17 MED ORDER — SIMVASTATIN 20 MG PO TABS
40.0000 mg | ORAL_TABLET | Freq: Every day | ORAL | Status: DC
  Administered 2014-09-17 – 2014-09-25 (×8): 40 mg via ORAL
  Filled 2014-09-17 (×10): qty 2

## 2014-09-17 MED ORDER — OXYCODONE HCL 5 MG PO TABS: 5.0000 mg | ORAL_TABLET | ORAL | Status: DC | PRN

## 2014-09-17 MED ORDER — PANTOPRAZOLE SODIUM 20 MG PO TBEC
20.0000 mg | DELAYED_RELEASE_TABLET | Freq: Two times a day (BID) | ORAL | Status: DC
  Filled 2014-09-17: qty 1

## 2014-09-17 MED ORDER — PANTOPRAZOLE SODIUM 40 MG PO TBEC
40.0000 mg | DELAYED_RELEASE_TABLET | Freq: Two times a day (BID) | ORAL | Status: DC
  Administered 2014-09-17 – 2014-09-24 (×14): 40 mg via ORAL
  Filled 2014-09-17 (×15): qty 1

## 2014-09-17 MED ORDER — LOSARTAN POTASSIUM 50 MG PO TABS
50.0000 mg | ORAL_TABLET | Freq: Every day | ORAL | Status: DC
  Administered 2014-09-17 – 2014-09-25 (×9): 50 mg via ORAL
  Filled 2014-09-17 (×10): qty 1

## 2014-09-17 MED ORDER — CEFTRIAXONE SODIUM 1 G IJ SOLR
INTRAMUSCULAR | Status: AC
  Filled 2014-09-17: qty 10

## 2014-09-17 MED ORDER — ALUM & MAG HYDROXIDE-SIMETH 200-200-20 MG/5ML PO SUSP: 30.0000 mL | ORAL | Status: DC | PRN

## 2014-09-17 MED ORDER — LOPERAMIDE HCL 2 MG PO CAPS
2.0000 mg | ORAL_CAPSULE | Freq: Once | ORAL | Status: AC
  Administered 2014-09-17: 2 mg via ORAL

## 2014-09-17 MED ORDER — LOPERAMIDE HCL 2 MG PO CAPS
ORAL_CAPSULE | ORAL | Status: AC
  Filled 2014-09-17: qty 1

## 2014-09-17 MED ORDER — QUETIAPINE FUMARATE 200 MG PO TABS
300.0000 mg | ORAL_TABLET | Freq: Every day | ORAL | Status: DC
  Administered 2014-09-17 – 2014-09-25 (×8): 300 mg via ORAL
  Filled 2014-09-17 (×9): qty 1

## 2014-09-17 MED ORDER — POTASSIUM CHLORIDE CRYS ER 20 MEQ PO TBCR
20.0000 meq | EXTENDED_RELEASE_TABLET | Freq: Every day | ORAL | Status: DC
  Administered 2014-09-17 – 2014-09-26 (×10): 20 meq via ORAL
  Filled 2014-09-17 (×10): qty 1

## 2014-09-17 MED ORDER — HYDROCHLOROTHIAZIDE 12.5 MG PO CAPS
12.5000 mg | ORAL_CAPSULE | Freq: Every day | ORAL | Status: DC
  Administered 2014-09-17 – 2014-09-24 (×8): 12.5 mg via ORAL
  Filled 2014-09-17 (×9): qty 1

## 2014-09-17 NOTE — [facility] Group Notes (Signed)
[facility] Group Notes:  (Nursing/MHT/Case Management/Adjunct)  Date:  09/17/2014  Time:  9:20 AM  Type of Therapy:  Community Meeting   Participation Level:  Did Not Attend  Summary of Progress/Problems:  Jenna Dudley 09/17/2014, 9:20 AM

## 2014-09-17 NOTE — Plan of Care (Signed)
Problem: Ineffective individual coping Goal: STG: Patient will remain free from self harm Outcome: Progressing Room closer to the nurses' station for close observation, wheel chair provided for ambulation, unsteady gait, age appropriate and polypharmacy high risk for falls; no falls or injury during this shift, allowed to have dinner in room. Affect and mood irritable, sad and miserable, medication compliant with encouragement. Remains on 15 minute checks. Will continue to follow POC.

## 2014-09-17 NOTE — Progress Notes (Signed)
Ms Jenna Dudley of [name] @ [phone], provided information to complete the PTA

## 2014-09-17 NOTE — [facility] Group Notes (Signed)
[facility] Group Notes:  (Nursing/MHT/Case Management/Adjunct)  Date:  09/17/2014  Time:  3:55 PM  Type of Therapy:  Movement Therapy  Participation Level:  Did Not Attend  Summary of Progress/Problems:  Jenna Dudley 09/17/2014, 3:55 PM

## 2014-09-17 NOTE — [facility] Group Notes (Signed)
[facility] Group Notes:  (Nursing/MHT/Case Management/Adjunct)  Date:  09/17/2014  Time:  4:28 AM  Type of Therapy:  Psychoeducational Skills  Participation Level:  Did Not Attend   Foy [place] 09/17/2014, 4:28 AM

## 2014-09-17 NOTE — Progress Notes (Signed)
This is a 79 year old female admitted by her husband due to increased confusion, psychosis and loss of appetite. Pt is incontinent of urine and stool with diarrhea prior to transferring to behavioral health. Pt also had increased WBC, so writer requested an evaluation prior to transfer. Pt now being treated for UTI. Pt is disoriented to time place and situation and is unable to respond appropriately to questions during admission process. Pt appears frightened, anxious and withdrawn. She is very weak and barely able to ambulate, although she follows staff instructions. She has bilateral scars on both knees from apparent replacement surgeries. No paraphernalia was found in pt belongings. Writer oriented pt to the milieu and 15 minute checks initiated for safety. Staff bathed and applied barrier cream prior to putting pt in to bed.

## 2014-09-17 NOTE — ED Provider Notes (Signed)
-----------------------------------------   1:08 AM on 09/17/2014 -----------------------------------------  Patient urinalysis was evaluated to show the patient had a fairly significant urinary tract infection. Patient was given a gram Rocephin IM as well as her first dose of Keflex and written for her to continue Keflex 4 times a day for 10 days while she was being evaluated by psychiatry. Patient was also given a dose of Imodium for her diarrhea. Patient is already admitted to psychiatry.  Leona Carry, MD 09/17/14 [phone]

## 2014-09-17 NOTE — Progress Notes (Signed)
Recreation Therapy Notes  Date: 06.23.16 Time: 3:00 pm Location: Craft Room  Group Topic: Leisure Education  Goal Area(s) Addresses:  Patient will identify one positive leisure activity.  Behavioral Response: Did not attend  Intervention: Leisure Time  Activity: Patients were instructed to write down one positive leisure activity. Patients were instructed to completed "Leisure Time Clock" worksheet. Patients were instructed to list 10 positive emotions as a group. Patients were instructed to match the leisure activities with the emotions.   Education: LRT educated patient on what was needed to participate in leisure.   Education Outcome: Patient did not attend group.  Clinical Observations/Feedback: Patient did not attend group.  Jacquelynn Cree, LRT/CTRS 09/17/2014 4:21 PM

## 2014-09-17 NOTE — ED Notes (Signed)
Discussed with Dr Ladona Ridgel,  Additional orders received for treatment of UTI and diarrhea.  Lab was notified of add on urine culture.   Called to behavior med unit and notified Richardean Canal of additional treatment.  She states that she will notify Minerva Areola.

## 2014-09-17 NOTE — BHH Suicide Risk Assessment (Signed)
[facility] Admission Suicide Risk Assessment   Nursing information obtained from:  Patient Demographic factors:   (Unable to asses. No responce) Current Mental Status:   (Unable to asses. No responce) Loss Factors:   (Unable to asses. No responce) Historical Factors:   (Unable to asses. No responce) Risk Reduction Factors:   (Unable to asses. No responce) Total Time spent with patient: 30 minutes Principal Problem: <principal problem not specified> Diagnosis:   Patient Active Problem List   Diagnosis Date Noted  . Hypertension [I10] 09/16/2014  . 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  World Science writer [facility]). Score between 0-7:  no or low risk or alcohol related problems. Score between 8-15:  moderate risk of alcohol related problems. Score between 16-19:  high risk of alcohol related problems. Score 20 or above:  warrants further diagnostic evaluation for alcohol dependence and treatment.   CLINICAL FACTORS:   Severe Anxiety and/or Agitation Depression:   Anhedonia Delusional Hopelessness   Musculoskeletal: Strength & Muscle Tone: decreased Gait & Station: unsteady Patient leans: N/A  Psychiatric Specialty Exam: Physical Exam  Constitutional: She appears well-developed and well-nourished. She appears lethargic. She has a sickly appearance.  HENT:  Head: Normocephalic and atraumatic.    Eyes: Conjunctivae are normal. Pupils are equal, round, and reactive to light.  Neck: Normal range of motion.  Cardiovascular: Normal heart sounds.   Respiratory: Effort normal.  GI: Soft.  Musculoskeletal: Normal range of motion.  Neurological: She appears lethargic.  Skin: Skin is warm and dry.  Psychiatric: Her mood appears anxious. Her speech is delayed and slurred. She is slowed and withdrawn. Thought content  is paranoid and delusional. Cognition and memory are impaired. She expresses inappropriate judgment. She exhibits a depressed mood. She exhibits abnormal recent memory.  Patient has the appearance typical for when she is severely depressed. She is withdrawn and flat looks very anxious and near tears much of the time. Tends to ruminate constantly on negative and psychotic levels of guilt.    Review of Systems  Constitutional: Negative.   HENT: Negative.   Eyes: Negative.   Respiratory: Negative.   Cardiovascular: Negative.   Gastrointestinal: Negative.   Musculoskeletal: Negative.   Skin: Negative.   Neurological: Negative.   Psychiatric/Behavioral: Positive for depression, suicidal ideas and memory loss. Negative for hallucinations and substance abuse. The patient is nervous/anxious and has insomnia.     Blood pressure 111/64, pulse 86, temperature 98.9 F (37.2 C), temperature source Oral, resp. rate 20, height  (1.549 m), weight 75 kg (165 lb 5.5 oz), SpO2 97 %.Body mass index is 31.26 kg/(m^2).  General Appearance: Disheveled  Eye Contact::  Minimal  Speech:  Garbled and Slow  Volume:  Decreased  Mood:  Depressed  Affect:  Depressed  Thought Process:  Disorganized  Orientation:  Full (Time, Place, and Person)  Thought Content:  Delusions and Paranoid Ideation  Suicidal Thoughts:  Yes.  without intent/plan  Homicidal Thoughts:  No  Memory:  Immediate;   Fair Recent;   Poor Remote;   Poor  Judgement:  Impaired  Insight:  Present  Psychomotor Activity:  Psychomotor Retardation  Concentration:  Poor  Recall:  Poor  Fund of Knowledge:Poor  Language: Fair  Akathisia:  No  Handed:  Right  AIMS (if indicated):     Assets:  Financial Resources/Insurance Housing Intimacy  Resilience  Sleep:  Number of Hours: 2.75  Cognition: Impaired,  Mild  ADL's:  Impaired     COGNITIVE FEATURES THAT CONTRIBUTE TO RISK:  Closed-mindedness    SUICIDE RISK:   Mild:  Suicidal  ideation of limited frequency, intensity, duration, and specificity.  There are no identifiable plans, no associated intent, mild dysphoria and related symptoms, good self-control (both objective and subjective assessment), few other risk factors, and identifiable protective factors, including available and accessible social support.  PLAN OF CARE: Patient admitted to the hospital because of decompensation in her recurrent psychotic depression. She is paranoid confused and having psychotic levels of guilt. Poor self-care. Feeling miserable. She is admitted back to the hospital because of the instability she had been showing outside the hospital. Plan is for ECT and continued medicine management.  Medical Decision Making:  Review of Psycho-Social Stressors (1), Review or order clinical lab tests (1), Established Problem, Worsening (2), Review of Medication Regimen & Side Effects (2) and Review of New Medication or Change in Dosage (2)  I certify that inpatient services furnished can reasonably be expected to improve the patient's condition.   John Clapacs 09/17/2014, 6:19 PM

## 2014-09-17 NOTE — Progress Notes (Signed)
Collateral information provided by patient's husband, Harlyn Sivilay, who is visiting patient at this time.

## 2014-09-17 NOTE — ED Notes (Signed)
Report given to Minerva Areola, nurse in behavioral medicine.  He request that this RN discuss the pts hx of diarrhea and u/a with ED physician prior to sending patient.

## 2014-09-17 NOTE — Tx Team (Addendum)
Initial Interdisciplinary Treatment Plan   PATIENT STRESSORS: Psychosis/Decreased level of function   PATIENT STRENGTHS: Suitable housing at discharge Supportive family  PROBLEM LIST: Problem List/Patient Goals Date to be addressed Date deferred Reason deferred Estimated date of resolution  Psychosis      Loss of appetite      UTI      Depression                                     DISCHARGE CRITERIA:  Ability to meet basic life and health needs Improved stabilization in mood, thinking, and/or behavior Medical problems require only outpatient monitoring Need for constant or close observation no longer present  PRELIMINARY DISCHARGE PLAN: Return to previous living arrangement  PATIENT/FAMIILY INVOLVEMENT: This treatment plan has been presented to and reviewed with the patient, Jenna Dudley, and/or family member.  The patient and family have been given the opportunity to ask questions and make suggestions.  Barbette Merino, ERIC Shari Prows 09/17/2014, 6:33 AM

## 2014-09-17 NOTE — Progress Notes (Signed)
Observed in room still resting at 0750, spontaneous response to knock and announcement; sad, miserable affect, soft low volume of voice, not saying much except "Can I just lay back down?.." Encouraged to join thr rest of the patients in the Day Room for breakfast, declined, was allowed to lay down. Breakfast tray brought to room by behavioral technician.

## 2014-09-17 NOTE — H&P (Signed)
Jenna Dudley is an 79 y.o. female.   Chief Complaint: "I know I'm not perfect" HPI: Information from the patient and the chart. This is a 79 year old woman well known to me from years of maintenance ECT who had recently become noncompliant with her recommended ECT treatment. More recently than that she had become noncompliant with her anti-psychotic and antidepressive medication. Her husband reports that she was decompensating badly becoming more confused and paranoid and withdrawn. More bizarre behavior at home. I had spoken to him last week and advised him to try and maintain her if it was safe at home and then bring her in for ECT but it appears that things had decompensated too badly and he brought her to the emergency room yesterday. Patient is only minimally able to give history. She is confused and is constantly ruminating about inappropriate guilt. There is no known specific new stress other than noncompliance with treatment.  Past psychiatric history: This patient has a long history of recurrent psychotic depression. When she gets depressed and psychotic she tends to believe that she has done some kind of evil thing which she can never articulate. She has passive suicidal thoughts and talks about people hating her and wanting her to be dead. She will eat less sleep less and become unable to interact with her family. Clearly miserable. Not sure if she is ever actually had a suicide attempt but she decompensates to the point where she is unable to care for herself at all. No known history of mania. Patient had responded well to ECT and had been on a regimen of every 2 week ECT treatments. When we have tried to stretch it out much beyond that she often decompensates. For reasons I'm not clear about she had recently decided to skip a couple of treatments and predictably she has gotten in this situation.  Social history: Patient is married and has 5 adult daughters. Husband is knowledgeable about her  condition and very understanding. Has always been very supportive and appropriate in managing her condition. They appear to have a very positive relationship. She has a good relationship with most of her daughters. I believe that one of him is sometimes argumentative with her but as far as I know that situation has improved recently.  Medical history: High blood pressure. Has a past history of a stroke. Gastric reflux symptoms. Chronic low back pain. Probably having a urinary tract infection now.  Family history: Unknown  Current medication: Hydrochlorothiazide 12.5 mg a day Cozaar 50 mg per day mirtazapine 45 mg at night quetiapine 300 mg at night and Zoloft 100 mg per day simvastatin 40 mg per day  Substance abuse history: Noncontributory  Past Medical History  Diagnosis Date  . Hypertension   . Hyperlipemia 07/29/14  . Chronic low back pain 07/29/14  . Spinal stenosis 07/29/14  . GERD (gastroesophageal reflux disease)   . Depression   . Esophageal spasm 07/29/14  . Bilateral carpal tunnel syndrome 07/27/14  . Degenerative arthritis of lumbar spine 07/27/14    Past Surgical History  Procedure Laterality Date  . Hip surgery Right   . Replacement total knee bilateral Bilateral 07/27/14  . Inner ear surgery Right     Family History  Problem Relation Age of Onset  . Family history unknown: Yes   Social History:  reports that she quit smoking about 39 years ago. Her smoking use included Cigarettes. She does not have any smokeless tobacco history on file. She reports that she does  not drink alcohol or use illicit drugs.  Allergies:  Allergies  Allergen Reactions  . Asa [Aspirin]   . Sulfa Antibiotics     Medications Prior to Admission  Medication Sig Dispense Refill  . acetaminophen-codeine (TYLENOL #3) 300-30 MG per tablet Take 1 tablet by mouth every 8 (eight) hours as needed for moderate pain or severe pain.    Marland Kitchen losartan-hydrochlorothiazide (HYZAAR) 50-12.5 MG per tablet Take 1 tablet  by mouth daily.    . mirtazapine (REMERON) 45 MG tablet Take 45 mg by mouth at bedtime.    Marland Kitchen OLANZapine (ZYPREXA) 20 MG tablet Take 20 mg by mouth at bedtime.    Marland Kitchen omeprazole (PRILOSEC) 20 MG capsule Take 20 mg by mouth 2 (two) times daily before a meal.     . potassium chloride SA (K-DUR,KLOR-CON) 20 MEQ tablet Take 20 mEq by mouth daily.    . QUEtiapine (SEROQUEL) 300 MG tablet Take 300 mg by mouth at bedtime.    . sertraline (ZOLOFT) 100 MG tablet Take 200 mg by mouth daily. 2 tabs in the am    . simvastatin (ZOCOR) 40 MG tablet Take 40 mg by mouth at bedtime.       Results for orders placed or performed during the hospital encounter of 09/15/14 (from the past 48 hour(s))  CBC WITH DIFFERENTIAL     Status: None   Collection Time: 09/15/14  9:27 PM  Result Value Ref Range   WBC 7.4 3.6 - 11.0 K/uL   RBC 4.71 3.80 - 5.20 MIL/uL   Hemoglobin 13.5 12.0 - 16.0 g/dL   HCT 40.9 35.0 - 47.0 %   MCV 86.7 80.0 - 100.0 fL   MCH 28.6 26.0 - 34.0 pg   MCHC 33.0 32.0 - 36.0 g/dL   RDW 14.5 11.5 - 14.5 %   Platelets 319 150 - 440 K/uL   Neutrophils Relative % 63 %   Lymphocytes Relative 22 %   Monocytes Relative 10 %   Eosinophils Relative 4 %   Basophils Relative 1 %   Neutro Abs 4.7 1.4 - 6.5 K/uL   Lymphs Abs 1.6 1.0 - 3.6 K/uL   Monocytes Absolute 0.7 0.2 - 0.9 K/uL   Eosinophils Absolute 0.3 0 - 0.7 K/uL   Basophils Absolute 0.1 0 - 0.1 K/uL  Comprehensive metabolic panel     Status: Abnormal   Collection Time: 09/15/14  9:27 PM  Result Value Ref Range   Sodium 138 135 - 145 mmol/L   Potassium 3.9 3.5 - 5.1 mmol/L   Chloride 104 101 - 111 mmol/L   CO2 25 22 - 32 mmol/L   Glucose, Bld 127 (H) 65 - 99 mg/dL   BUN 20 6 - 20 mg/dL   Creatinine, Ser 0.83 0.44 - 1.00 mg/dL   Calcium 9.4 8.9 - 10.3 mg/dL   Total Protein 7.5 6.5 - 8.1 g/dL   Albumin 4.4 3.5 - 5.0 g/dL   AST 33 15 - 41 U/L   ALT 23 14 - 54 U/L   Alkaline Phosphatase 86 38 - 126 U/L   Total Bilirubin 0.5 0.3 - 1.2  mg/dL   GFR calc non Af Amer >60 >60 mL/min   GFR calc Af Amer >60 >60 mL/min    Comment: (NOTE) The eGFR has been calculated using the CKD EPI equation. This calculation has not been validated in all clinical situations. eGFR's persistently <60 mL/min signify possible Chronic Kidney Disease.    Anion gap 9 5 - 15  Ethanol     Status: None   Collection Time: 09/15/14  9:27 PM  Result Value Ref Range   Alcohol, Ethyl (B) <5 <5 mg/dL    Comment:        LOWEST DETECTABLE LIMIT FOR SERUM ALCOHOL IS 5 mg/dL FOR MEDICAL PURPOSES ONLY   Acetaminophen level     Status: Abnormal   Collection Time: 09/15/14  9:27 PM  Result Value Ref Range   Acetaminophen (Tylenol), Serum <10 (L) 10 - 30 ug/mL    Comment:        THERAPEUTIC CONCENTRATIONS VARY SIGNIFICANTLY. A RANGE OF 10-30 ug/mL MAY BE AN EFFECTIVE CONCENTRATION FOR MANY PATIENTS. HOWEVER, SOME ARE BEST TREATED AT CONCENTRATIONS OUTSIDE THIS RANGE. ACETAMINOPHEN CONCENTRATIONS >150 ug/mL AT 4 HOURS AFTER INGESTION AND >50 ug/mL AT 12 HOURS AFTER INGESTION ARE OFTEN ASSOCIATED WITH TOXIC REACTIONS.   Salicylate level     Status: None   Collection Time: 09/15/14  9:27 PM  Result Value Ref Range   Salicylate Lvl <6.7 2.8 - 30.0 mg/dL  Urinalysis complete, with microscopic ([facility] only)     Status: Abnormal   Collection Time: 09/16/14  9:05 PM  Result Value Ref Range   Color, Urine AMBER (A) YELLOW   APPearance CLOUDY (A) CLEAR   Glucose, UA NEGATIVE NEGATIVE mg/dL   Bilirubin Urine NEGATIVE NEGATIVE   Ketones, ur 1+ (A) NEGATIVE mg/dL   Specific Gravity, Urine 1.020 1.005 - 1.030   Hgb urine dipstick NEGATIVE NEGATIVE   pH 5.0 5.0 - 8.0   Protein, ur 30 (A) NEGATIVE mg/dL   Nitrite NEGATIVE NEGATIVE   Leukocytes, UA 3+ (A) NEGATIVE   RBC / HPF 0-5 0 - 5 RBC/hpf   WBC, UA TOO NUMEROUS TO COUNT 0 - 5 WBC/hpf   Bacteria, UA NONE SEEN NONE SEEN   Squamous Epithelial / LPF NONE SEEN NONE SEEN   Mucous PRESENT   Urine Drug  Screen, Qualitative ([facility] only)     Status: Abnormal   Collection Time: 09/16/14  9:05 PM  Result Value Ref Range   Tricyclic, Ur Screen POSITIVE (A) NONE DETECTED   Amphetamines, Ur Screen NONE DETECTED NONE DETECTED   MDMA (Ecstasy)Ur Screen NONE DETECTED NONE DETECTED   Cocaine Metabolite,Ur [place] NONE DETECTED NONE DETECTED   Opiate, Ur Screen NONE DETECTED NONE DETECTED   Phencyclidine (PCP) Ur S NONE DETECTED NONE DETECTED   Cannabinoid 50 Ng, Ur [place] NONE DETECTED NONE DETECTED   Barbiturates, Ur Screen NONE DETECTED NONE DETECTED   Benzodiazepine, Ur Scrn POSITIVE (A) NONE DETECTED   Methadone Scn, Ur NONE DETECTED NONE DETECTED    Comment: (NOTE) 672  Tricyclics, urine               Cutoff 1000 ng/mL 200  Amphetamines, urine             Cutoff 1000 ng/mL 300  MDMA (Ecstasy), urine           Cutoff 500 ng/mL 400  Cocaine Metabolite, urine       Cutoff 300 ng/mL 500  Opiate, urine                   Cutoff 300 ng/mL 600  Phencyclidine (PCP), urine      Cutoff 25 ng/mL 700  Cannabinoid, urine              Cutoff 50 ng/mL 800  Barbiturates, urine             Cutoff 200  ng/mL 900  Benzodiazepine, urine           Cutoff 200 ng/mL 1000 Methadone, urine                Cutoff 300 ng/mL 1100 1200 The urine drug screen provides only a preliminary, unconfirmed 1300 analytical test result and should not be used for non-medical 1400 purposes. Clinical consideration and professional judgment should 1500 be applied to any positive drug screen result due to possible 1600 interfering substances. A more specific alternate chemical method 1700 must be used in order to obtain a confirmed analytical result.  1800 Gas chromato graphy / mass spectrometry (GC/MS) is the preferred 1900 confirmatory method.   Glucose, capillary     Status: Abnormal   Collection Time: 09/16/14  9:35 PM  Result Value Ref Range   Glucose-Capillary 104 (H) 65 - 99 mg/dL   Dg Chest 1 View  09/17/2014   CLINICAL DATA:   Hypertension.  EXAM: CHEST  1 VIEW  COMPARISON:  05/21/2014.  FINDINGS: Mediastinum and hilar structures normal. Right base pleural parenchymal thickening consistent with scarring. No interim change. No focal infiltrate. No pleural effusion or pneumothorax.  IMPRESSION: Right base pleural parenchymal thickening consistent with scarring. No acute cardiopulmonary disease.   Electronically Signed   By: Marcello Moores  Register   On: 09/17/2014 07:48    Review of Systems  Constitutional: Positive for malaise/fatigue.  HENT: Negative.   Eyes: Negative.   Respiratory: Negative.   Cardiovascular: Negative.   Gastrointestinal: Negative.   Genitourinary: Positive for flank pain.  Musculoskeletal: Negative.   Skin: Negative.   Neurological: Negative.   Psychiatric/Behavioral: Positive for depression. Negative for suicidal ideas, hallucinations and substance abuse. The patient is nervous/anxious and has insomnia.     Blood pressure 111/64, pulse 86, temperature 98.9 F (37.2 C), temperature source Oral, resp. rate 20, height _0  (1.549 m), weight 75 kg (165 lb 5.5 oz), SpO2 97 %. Physical Exam  Constitutional: She appears well-developed and well-nourished. She appears lethargic. She appears toxic. She has a sickly appearance.  HENT:  Head: Normocephalic and atraumatic.    Eyes: Conjunctivae are normal. Pupils are equal, round, and reactive to light.  Neck: Normal range of motion.  Cardiovascular: Normal heart sounds.   Respiratory: Effort normal.  GI: Soft.  Musculoskeletal: Normal range of motion.  Neurological: She appears lethargic.  Skin: Skin is warm and dry.  Psychiatric: Her mood appears anxious. Her affect is blunt. Her speech is delayed and slurred. She is withdrawn. Thought content is paranoid. Cognition and memory are impaired. She expresses inappropriate judgment. She exhibits a depressed mood.     Assessment/Plan Patient was admitted to the hospital because her psychotic depression  had gotten to the point where her husband was no longer able to safely manage her at home. We were not able to fit her into the schedule for ECT on Wednesday but she will receive ECT tomorrow. I have continued all of her outpatient medicines. Labs reviewed. The most remarkable thing is that she appears to have a urinary tract infection. She is allergic to Septra and sulfa medications and will be prescribed ciprofloxacin. ECT right unilateral scheduled for tomorrow morning. Anticipate likely hospitalization through the weekend and into next week at which time I hope that she'll be improved enough that she can go home.  Khelani Kops 09/17/2014, 6:23 PM

## 2014-09-17 NOTE — Progress Notes (Signed)
Recreation Therapy Notes  At approximately 10:25 am, LRT spoke with patient's nurse regarding assessment. Nursing stating patient was not appropriate at the time as patient started to cry when nursing tried to speak with her. LRT will attempt assessment tomorrow.  Jacquelynn Cree, LRT/CTRS 09/17/2014 1:21 PM

## 2014-09-18 ENCOUNTER — Encounter: Payer: Self-pay | Admitting: *Deleted

## 2014-09-18 ENCOUNTER — Encounter: Payer: Medicare Other | Attending: Psychiatry

## 2014-09-18 ENCOUNTER — Inpatient Hospital Stay: Payer: Medicare Other | Admitting: Anesthesiology

## 2014-09-18 ENCOUNTER — Other Ambulatory Visit: Payer: Self-pay | Admitting: *Deleted

## 2014-09-18 LAB — LIPID PANEL
Cholesterol: 149 mg/dL (ref 0–200)
HDL: 33 mg/dL — ABNORMAL LOW (ref >40)
LDL CALC: 97 mg/dL (ref 0–99)
Total CHOL/HDL Ratio: 4.5 RATIO
Triglycerides: 94 mg/dL (ref <150)
VLDL: 19 mg/dL (ref 0–40)

## 2014-09-18 LAB — TSH: TSH: 1.19 u[IU]/mL (ref 0.350–4.500)

## 2014-09-18 LAB — HEMOGLOBIN A1C: Hgb A1c MFr Bld: 5.6 % (ref 4.0–6.0)

## 2014-09-18 MED ORDER — OLANZAPINE 10 MG PO TABS
20.0000 mg | ORAL_TABLET | Freq: Every day | ORAL | Status: DC
  Administered 2014-09-19 – 2014-09-25 (×7): 20 mg via ORAL
  Filled 2014-09-18 (×8): qty 2

## 2014-09-18 MED ORDER — LABETALOL HCL 5 MG/ML IV SOLN
20.0000 mg | Freq: Once | INTRAVENOUS | Status: AC
  Administered 2014-09-18: 20 mg via INTRAVENOUS

## 2014-09-18 MED ORDER — METHOHEXITAL SODIUM 100 MG/10ML IV SOSY
60.0000 mg | PREFILLED_SYRINGE | Freq: Once | INTRAVENOUS | Status: AC
  Administered 2014-09-18: 60 mg via INTRAVENOUS

## 2014-09-18 MED ORDER — SERTRALINE HCL 100 MG PO TABS
200.0000 mg | ORAL_TABLET | Freq: Every day | ORAL | Status: DC
  Administered 2014-09-18 – 2014-09-26 (×9): 200 mg via ORAL
  Filled 2014-09-18 (×11): qty 2

## 2014-09-18 MED ORDER — SODIUM CHLORIDE 0.9 % IV SOLN
INTRAVENOUS | Status: DC | PRN
  Administered 2014-09-18: 11:00:00 via INTRAVENOUS

## 2014-09-18 MED ORDER — KETOROLAC TROMETHAMINE 30 MG/ML IJ SOLN
30.0000 mg | Freq: Once | INTRAMUSCULAR | Status: AC
  Administered 2014-09-18: 30 mg via INTRAVENOUS

## 2014-09-18 MED ORDER — ACETAMINOPHEN-CODEINE #3 300-30 MG PO TABS
1.0000 | ORAL_TABLET | Freq: Three times a day (TID) | ORAL | Status: DC | PRN
Start: 2014-09-18 — End: 2014-09-23

## 2014-09-18 MED ORDER — DEXTROSE 5 % IV SOLN
250.0000 mL | Freq: Once | INTRAVENOUS | Status: AC
  Administered 2014-09-18: 250 mL via INTRAVENOUS

## 2014-09-18 MED ORDER — SUCCINYLCHOLINE CHLORIDE 20 MG/ML IJ SOLN
80.0000 mg | Freq: Once | INTRAMUSCULAR | Status: AC
  Administered 2014-09-18: 80 mg via INTRAVENOUS

## 2014-09-18 NOTE — Progress Notes (Signed)
D: Pt is awake in bed this evening. Pt mood is frightened and her affect is anxious/sad. Pt is disoriented to situation, place and time and has difficulty expressing herself. Pt is ambivalent and needs encouragement to accept medications and redirection.  A: Writer provided emotional support, medications and fluids and instructed pt to turn frequently in bed to prevent pressure ulcers.  R: Pt is receptive and is able to turn self in bed during the night. Pt was up once and walked into the medication room. Writer utilized therapeutic communication, directed pt back to bed and asked MHT to monitor until asleep. Pt is sleeping well. Will continue to monitor.

## 2014-09-18 NOTE — Anesthesia Procedure Notes (Addendum)
Date/Time: 09/18/2014 11:21 AM Performed by: Junious Silk Pre-anesthesia Checklist: Patient identified, Emergency Drugs available, Suction available, Patient being monitored and Timeout performed Oxygen Delivery Method: Ambu bag

## 2014-09-18 NOTE — Procedures (Signed)
ECT SERVICES Physician's Interval Evaluation & Treatment Note  Patient Identification: Jenna Dudley MRN:  [id number] Date of Evaluation:  09/18/2014 TX #: [id number]  MADRS:   MMSE:   P.E. Findings:  Patient is withdrawn much less responsive but awake. Vitals stable. Chronic back pain. Urinary tract infection being treated.  Psychiatric Interval Note:  Worsening symptoms of psychotic depression typical for the patient  Subjective:  Patient is a 79 y.o. female seen for evaluation for Electroconvulsive Therapy. Patient has no lucid subjective complaint  Treatment Summary:   [x]   Right Unilateral             []  Bilateral   % Energy : 0.3 ms 100%   Impedance: 1460 ohms  Seizure Energy Index: 1416 V squared  Postictal Suppression Index: 47%  Seizure Concordance Index: 96%  Medications  Pre Shock: Toradol 30 mg, labetalol 20 mg, Brevital 60 mg, succinylcholine 80 mg  Post Shock: None  Seizure Duration: 18 seconds by EMG, 40 seconds by EEG   Comments: Patient will remain inpatient and has a next treatment scheduled for Monday   Lungs:  [x]   Clear to auscultation               []  Other:   Heart:    [x]   Regular rhythm             []  irregular rhythm    [x]   Previous H&P reviewed, patient examined and there are NO CHANGES                 []   Previous H&P reviewed, patient examined and there are changes noted.   Mordecai Rasmussen, MD 6/24/201611:17 AM   2

## 2014-09-18 NOTE — Progress Notes (Signed)
ECT today, tolerated well.  Denies any pain.  Looks sad and tense.  Denies SI.  Medication compliant.  Requires some assistance with ADL's and ambulation.  Becomes tearful at times and will tell you shed doesn't know what she feels when does not want to talk.  Requires some redirection.

## 2014-09-18 NOTE — Plan of Care (Signed)
Problem: Spiritual Needs Goal: Ability to function at adequate level Outcome: Not Progressing Reluctant to communicate with staff.  Have to insist she get out of bed for meals and to receive her medications

## 2014-09-18 NOTE — Progress Notes (Signed)
Recreation Therapy Notes  At approximately 1:20 pm, LRT attempted assessment, but patient would not speak.  Jacquelynn Cree, LRT/CTRS 09/18/2014 2:38 PM

## 2014-09-18 NOTE — [facility] Group Notes (Signed)
[facility] Group Notes:  (Nursing/MHT/Case Management/Adjunct)  Date:  09/18/2014  Time:  4:23 AM  Type of Therapy:  Group Therapy  Participation Level:  Did Not Attend    Veva Holes 09/18/2014, 4:23 AM

## 2014-09-18 NOTE — Plan of Care (Signed)
Problem: [facility] Concurrent Medical Problem Goal: STG-Verbalize two symptoms that would warrant further (STG-Verbalize two symptoms that would warrant further treatment)  Outcome: Not Progressing Becomes tearful when asked questions and just states "Can I lay down"

## 2014-09-18 NOTE — Progress Notes (Signed)
[facility] MD Progress Note  09/18/2014 9:42 PM Jenna Dudley  MRN:  [id number] Subjective:  Follow-up for 79 year old woman with severe major depression in the hospital for treatment with ECT Principal Problem: Severe recurrent major depression with psychotic features Diagnosis:   Patient Active Problem List   Diagnosis Date Noted  . Infection of urinary tract [N39.0] 09/17/2014  . Essential hypertension [I10] 09/17/2014  . Chronic back pain [M54.9, G89.29] 09/17/2014  . Gastric reflux [K21.9] 09/17/2014  . History of stroke [Z86.73] 09/17/2014  . Hypertension [I10] 09/16/2014  . Severe recurrent major depression with psychotic features [F33.3]    Total Time spent with patient: 30 minutes   Past Medical History:  Past Medical History  Diagnosis Date  . Hypertension   . Hyperlipemia 07/29/14  . Chronic low back pain 07/29/14  . Spinal stenosis 07/29/14  . GERD (gastroesophageal reflux disease)   . Depression   . Esophageal spasm 07/29/14  . Bilateral carpal tunnel syndrome 07/27/14  . Degenerative arthritis of lumbar spine 07/27/14    Past Surgical History  Procedure Laterality Date  . Hip surgery Right   . Replacement total knee bilateral Bilateral 07/27/14  . Inner ear surgery Right    Family History:  Family History  Problem Relation Age of Onset  . Family history unknown: Yes   Social History:  History  Alcohol Use No     History  Drug Use No    History   Social History  . Marital Status: Single    Spouse Name: N/A  . Number of Children: N/A  . Years of Education: N/A   Social History Main Topics  . Smoking status: Former Smoker    Types: Cigarettes    Quit date: 07/29/1975  . Smokeless tobacco: Not on file  . Alcohol Use: No  . Drug Use: No  . Sexual Activity: No     Comment: postmenopause   Other Topics Concern  . None   Social History Narrative   Additional History:    Sleep: Fair  Appetite:  Fair   Assessment:   Musculoskeletal: Strength & Muscle  Tone: decreased Gait & Station: unsteady Patient leans: N/A   Psychiatric Specialty Exam: Physical Exam  Constitutional: She appears well-developed and well-nourished. She appears lethargic. She has a sickly appearance.  HENT:  Head: Normocephalic and atraumatic.  Eyes: Conjunctivae are normal. Pupils are equal, round, and reactive to light.  Neck: Normal range of motion.  Cardiovascular: Normal heart sounds.   Respiratory: Effort normal.  GI: Soft.  Musculoskeletal: Normal range of motion.  Neurological: She appears lethargic.  Skin: Skin is warm and dry.  Psychiatric: Her mood appears anxious. Her speech is delayed. She is withdrawn. Thought content is delusional. Cognition and memory are impaired. She expresses inappropriate judgment. She exhibits a depressed mood. She exhibits abnormal recent memory.  Patient was evaluated this evening. She was sitting up in her wheelchair well dressed. Awake. Made some eye contact and where she was. Still very confused and paranoid and withdrawn however. Speech minimal.    Review of Systems  Constitutional: Negative.   HENT: Negative.   Eyes: Negative.   Respiratory: Negative.   Cardiovascular: Negative.   Gastrointestinal: Negative.   Musculoskeletal: Negative.   Skin: Negative.   Neurological: Negative.   Psychiatric/Behavioral: Positive for depression. Negative for suicidal ideas and substance abuse. The patient is nervous/anxious and has insomnia.     Blood pressure 119/68, pulse 81, temperature 99.2 F (37.3 C), temperature source Oral, resp.  rate 20, height '5\' 1"'  (1.549 m), weight 68.04 kg (150 lb), SpO2 99 %.Body mass index is 28.36 kg/(m^2).  General Appearance: Fairly Groomed  Engineer, water::  Fair  Speech:  Blocked and Slurred  Volume:  Decreased  Mood:  Depressed  Affect:  Depressed  Thought Process:  Tangential  Orientation:  Negative  Thought Content:  Delusions  Suicidal Thoughts:  No  Homicidal Thoughts:  No  Memory:   Immediate;   Fair Recent;   Poor Remote;   Fair  Judgement:  Impaired  Insight:  Lacking  Psychomotor Activity:  Decreased  Concentration:  Poor  Recall:  Poor  Fund of Knowledge:Fair  Language: Fair  Akathisia:  No  Handed:  Right  AIMS (if indicated):     Assets:  Financial Resources/Insurance Housing Social Support  ADL's:  Impaired  Cognition: Impaired,  Moderate  Sleep:  Number of Hours: 7.75     Current Medications: Current Facility-Administered Medications  Medication Dose Route Frequency Provider Last Rate Last Dose  . acetaminophen (TYLENOL) tablet 650 mg  650 mg Oral Q6H PRN Gonzella Lex, MD   650 mg at 09/17/14 1045  . acetaminophen-codeine (TYLENOL #3) 300-30 MG per tablet 1 tablet  1 tablet Oral Q8H PRN Gonzella Lex, MD      . alum & mag hydroxide-simeth (MAALOX/MYLANTA) 200-200-20 MG/5ML suspension 30 mL  30 mL Oral Q4H PRN Gonzella Lex, MD      . ciprofloxacin (CIPRO) tablet 500 mg  500 mg Oral BID Gonzella Lex, MD   500 mg at 09/17/14 2100  . hydrochlorothiazide (MICROZIDE) capsule 12.5 mg  12.5 mg Oral Daily Gonzella Lex, MD   12.5 mg at 09/18/14 0749  . losartan (COZAAR) tablet 50 mg  50 mg Oral Daily Gonzella Lex, MD   50 mg at 09/18/14 0749  . magnesium hydroxide (MILK OF MAGNESIA) suspension 30 mL  30 mL Oral Daily PRN Gonzella Lex, MD      . mirtazapine (REMERON) tablet 45 mg  45 mg Oral QHS Gonzella Lex, MD   45 mg at 09/17/14 2125  . OLANZapine (ZYPREXA) tablet 20 mg  20 mg Oral QHS Gonzella Lex, MD      . oxyCODONE-acetaminophen (PERCOCET/ROXICET) 5-325 MG per tablet 1 tablet  1 tablet Oral Q4H PRN Gonzella Lex, MD       And  . oxyCODONE (Oxy IR/ROXICODONE) immediate release tablet 5 mg  5 mg Oral Q4H PRN Gonzella Lex, MD      . pantoprazole (PROTONIX) EC tablet 40 mg  40 mg Oral BID Gonzella Lex, MD   40 mg at 09/18/14 0749  . potassium chloride SA (K-DUR,KLOR-CON) CR tablet 20 mEq  20 mEq Oral Daily Gonzella Lex, MD   20 mEq  at 09/18/14 1657  . QUEtiapine (SEROQUEL) tablet 300 mg  300 mg Oral QHS Gonzella Lex, MD   300 mg at 09/17/14 2124  . sertraline (ZOLOFT) tablet 200 mg  200 mg Oral Daily Gonzella Lex, MD   200 mg at 09/18/14 1655  . simvastatin (ZOCOR) tablet 40 mg  40 mg Oral q1800 Gonzella Lex, MD   40 mg at 09/17/14 1713    Lab Results:  Results for orders placed or performed during the hospital encounter of 09/17/14 (from the past 48 hour(s))  Hemoglobin A1c     Status: None   Collection Time: 09/18/14  6:55 AM  Result Value Ref  Range   Hgb A1c MFr Bld 5.6 4.0 - 6.0 %  Lipid panel, fasting     Status: Abnormal   Collection Time: 09/18/14  6:55 AM  Result Value Ref Range   Cholesterol 149 0 - 200 mg/dL   Triglycerides 94 <150 mg/dL   HDL 33 (L) >40 mg/dL   Total CHOL/HDL Ratio 4.5 RATIO   VLDL 19 0 - 40 mg/dL   LDL Cholesterol 97 0 - 99 mg/dL    Comment:        Total Cholesterol/HDL:CHD Risk Coronary Heart Disease Risk Table                     Men   Women  1/2 Average Risk   3.4   3.3  Average Risk       5.0   4.4  2 X Average Risk   9.6   7.1  3 X Average Risk  23.4   11.0        Use the calculated Patient Ratio above and the CHD Risk Table to determine the patient's CHD Risk.        ATP III CLASSIFICATION (LDL):  <100     mg/dL   Optimal  100-129  mg/dL   Near or Above                    Optimal  130-159  mg/dL   Borderline  160-189  mg/dL   High  >190     mg/dL   Very High   TSH     Status: None   Collection Time: 09/18/14  6:55 AM  Result Value Ref Range   TSH 1.190 0.350 - 4.500 uIU/mL    Physical Findings: AIMS: Facial and Oral Movements Muscles of Facial Expression: None, normal Lips and Perioral Area: None, normal Jaw: None, normal Tongue: None, normal,Extremity Movements Upper (arms, wrists, hands, fingers): None, normal Lower (legs, knees, ankles, toes): None, normal, Trunk Movements Neck, shoulders, hips: None, normal, Overall Severity Severity of  abnormal movements (highest score from questions above): None, normal Incapacitation due to abnormal movements: None, normal, Dental Status Current problems with teeth and/or dentures?: Yes (poor dental hygiene) Does patient usually wear dentures?:  (unable to respond)  CIWA:    COWS:     Treatment Plan Summary: Medication management and Plan Patient received an ECT treatment today which she tolerated well. She looks a little bit more alert and interactive this evening but is still clearly very impaired. Plan is to continue antidepressives and anti-psychotic medication as she was taking outpatient while continuing ECT treatment. Next treatment scheduled for Monday. Supportive counseling completed. Met and spoke with the husband as well this morning who agrees with the plan.   Medical Decision Making:  Review of Psycho-Social Stressors (1), Review and summation of old records (2), Established Problem, Worsening (2), Review of Medication Regimen & Side Effects (2) and Review of New Medication or Change in Dosage (2)     John Clapacs 09/18/2014, 9:42 PM

## 2014-09-18 NOTE — [facility] Group Notes (Signed)
[facility] Group Notes:  (Nursing/MHT/Case Management/Adjunct)  Date:  09/18/2014  Time:  11:50 AM  Type of Therapy:  Group Therapy  Participation Level:  Did Not Attend  Summary of Progress/Problems: patient at ECT  Jenna Dudley Jenna Dudley 09/18/2014, 11:50 AM

## 2014-09-18 NOTE — Anesthesia Preprocedure Evaluation (Addendum)
Anesthesia Evaluation  Patient identified by MRN, date of birth, ID band Patient awake    Reviewed: Allergy & Precautions, NPO status , Patient's Chart, lab work & pertinent test results  Airway Mallampati: II  TM Distance: >3 FB Neck ROM: Limited    Dental  (+) Poor Dentition   Pulmonary former smoker,  breath sounds clear to auscultation  Pulmonary exam normal       Cardiovascular hypertension, Normal cardiovascular exam    Neuro/Psych    GI/Hepatic Neg liver ROS, GERD-  Medicated and Controlled,  Endo/Other  negative endocrine ROS  Renal/GU negative Renal ROS     Musculoskeletal  (+) Arthritis -, Osteoarthritis,    Abdominal Normal abdominal exam  (+)   Peds  Hematology negative hematology ROS (+)   Anesthesia Other Findings   Reproductive/Obstetrics                            Anesthesia Physical Anesthesia Plan  ASA: III  Anesthesia Plan: General   Post-op Pain Management:    Induction: Intravenous  Airway Management Planned: Simple Face Mask  Additional Equipment:   Intra-op Plan:   Post-operative Plan:   Informed Consent: I have reviewed the patients History and Physical, chart, labs and discussed the procedure including the risks, benefits and alternatives for the proposed anesthesia with the patient or authorized representative who has indicated his/her understanding and acceptance.   Dental advisory given  Plan Discussed with:   Anesthesia Plan Comments:         Anesthesia Quick Evaluation

## 2014-09-18 NOTE — Transfer of Care (Signed)
Immediate Anesthesia Transfer of Care Note  Patient: Jenna Dudley  Procedure(s) Performed: * No procedures listed *  Patient Location: PACU  Anesthesia Type:General  Level of Consciousness: sedated  Airway & Oxygen Therapy: Patient connected to face mask oxygen  Post-op Assessment: Report given to RN and Post -op Vital signs reviewed and stable  Post vital signs: Reviewed and stable  Last Vitals:  Filed Vitals:   09/18/14 0816  BP: 141/66  Pulse: 87  Temp: 36.7 C  Resp: 18    Complications: No apparent anesthesia complications

## 2014-09-18 NOTE — Progress Notes (Signed)
Recreation Therapy Notes  Date: 06.24.16 Time: 3:00 pm Location: Craft Room  Group Topic: Problem Solving, Communication, Teamwork  Goal Area(s) Addresses:  Patient will work in teams towards shared goal. Patient will verbalize skills needed to make activity successful. Patient will verbalize benefit of using skills identified to reach post d/c goals.  Behavioral Response: Did not attend  Intervention: Landing Pad  Activity: Patients were given 15 straws and approximately 2.5 feet of tape and instructed to build a landing pad to catch a golf ball.  Education: LRT educated patients on why communication, teamwork, and problem solving are important.  Education Outcome: Patient did not attend group.  Clinical Observations/Feedback: Patient did not attend group.  Sydell Prowell M, LRT/CTRS 09/18/2014 5:06 PM 

## 2014-09-18 NOTE — [facility] Group Notes (Signed)
[facility] Group Notes:  (Nursing/MHT/Case Management/Adjunct)  Date:  09/18/2014  Time:  11:42 PM  Type of Therapy:  Group Therapy  Participation Level:  None  Participation Quality:  Inattentive  Affect:  Not Congruent  Cognitive:  Lacking  Insight:  Lacking  Engagement in Group:  None  Modes of Intervention:  Discussion  Summary of Progress/Problems:  Jenna Dudley Jenna Dudley Jenna Dudley Jenna Dudley 09/18/2014, 11:42 PM

## 2014-09-18 NOTE — [facility] Group Notes (Signed)
[facility] LCSW Group Therapy  09/18/2014 4:12 PM  Type of Therapy:  Group Therapy  Participation Level:  Did Not Attend  Modes of Intervention:  Discussion, Education, Problem-solving, Socialization and Support  Summary of Progress/Problems:Feelings around relapse and recovery: Pt will discuss emotions they experience before and after a relapse. Pt will be encouraged to explore feelings around recovery.   [employer] MSW, LCSWA 09/18/2014, 4:12 PM

## 2014-09-18 NOTE — [facility] Group Notes (Signed)
[facility] LCSW Aftercare Discharge Planning Group Note  09/18/2014 11:02 AM  Participation Quality:  Patient did not attend group  Affect:  n/a  Cognitive:  n/a  Insight:  n/a  Engagement in Group:   n/a  Modes of Intervention:  n/a  Summary of Progress/Problems:  Jenna Dudley T 09/18/2014, 11:02 AM

## 2014-09-18 NOTE — Transfer of Care (Signed)
Immediate Anesthesia Transfer of Care Note  Patient: Jenna Dudley  Procedure(s) Performed: ECT  Patient Location: PACU  Anesthesia Type:General  Level of Consciousness: sedated  Airway & Oxygen Therapy: Patient Spontanous Breathing and Patient connected to face mask oxygen  Post-op Assessment: Report given to RN  Post vital signs: Reviewed and stable  Last Vitals:  Filed Vitals:   09/18/14 1202  BP: 128/63  Pulse: 77  Temp: 36.9 C  Resp: 20    Complications: No apparent anesthesia complications

## 2014-09-18 NOTE — Anesthesia Postprocedure Evaluation (Signed)
  Anesthesia Post-op Note  Patient: [name]  Procedure(s) Performed: * No procedures listed *  Anesthesia type:General  Patient location: PACU  Post pain: Pain level controlled  Post assessment: Post-op Vital signs reviewed, Patient's Cardiovascular Status Stable, Respiratory Function Stable, Patent Airway and No signs of Nausea or vomiting  Post vital signs: Reviewed and stable  Last Vitals:  Filed Vitals:   09/18/14 1416  BP: 119/68  Pulse: 81  Temp: 37.3 C  Resp: 20    Level of consciousness: awake, alert  and patient cooperative  Complications: No apparent anesthesia complications

## 2014-09-19 DIAGNOSIS — F333 Major depressive disorder, recurrent, severe with psychotic symptoms: Principal | ICD-10-CM

## 2014-09-19 LAB — URINE CULTURE

## 2014-09-19 MED ORDER — HYDROXYZINE HCL 50 MG PO TABS
50.0000 mg | ORAL_TABLET | Freq: Three times a day (TID) | ORAL | Status: DC | PRN
  Administered 2014-09-19: 50 mg via ORAL
  Filled 2014-09-19: qty 1

## 2014-09-19 MED ORDER — ENSURE ENLIVE PO LIQD
237.0000 mL | Freq: Two times a day (BID) | ORAL | Status: DC
  Administered 2014-09-19 – 2014-09-24 (×9): 237 mL via ORAL

## 2014-09-19 NOTE — Plan of Care (Signed)
Problem: Ineffective individual coping Goal: LTG: Patient will report a decrease in negative feelings Outcome: Not Progressing recluctant to talk

## 2014-09-19 NOTE — [facility] Group Notes (Signed)
[facility] LCSW Group Therapy  09/19/2014 3:28 PM  Type of Therapy:  Group Therapy  Participation Level:  None  Participation Quality:  Inattentive  Affect:  Flat  Cognitive:  Disorganized and Confused  Insight:  None  Engagement in Therapy:  None  Modes of Intervention:  Discussion, Education, Socialization and Support  Summary of Progress/Problems: Pt will identify unhealthy thoughts and how they impact their emotions and behavior. Pt will be encouraged to discuss these thoughts, emotions and behaviors with the group. Pt will be asked to pick a positive affirmation and discuss what emotion they experienced. Jenna Dudley was very confused during group. She was unable to say her name or participate in anyway.    Rondall Allegra, MSW, LCSWA 09/19/2014, 3:28 PM

## 2014-09-19 NOTE — Progress Notes (Addendum)
[facility] MD Progress Note  09/19/2014 3:14 PM Jenna Dudley  MRN:  [id number] Subjective:  Follow-up for 79 year old woman with severe major depression in the hospital for treatment with ECT. Patient was seen in her room. She did respond to my presence however seemed to be speaking of unseen others and that they had done something to her. Asked her about any physical pain and she responded "not too much." Per staff she has not been consuming all of her meals but has been drinking and thus I will order some ensure. Principal Problem: Severe recurrent major depression with psychotic features Diagnosis:   Patient Active Problem List   Diagnosis Date Noted  . Infection of urinary tract [N39.0] 09/17/2014  . Essential hypertension [I10] 09/17/2014  . Chronic back pain [M54.9, G89.29] 09/17/2014  . Gastric reflux [K21.9] 09/17/2014  . History of stroke [Z86.73] 09/17/2014  . Hypertension [I10] 09/16/2014  . Severe recurrent major depression with psychotic features [F33.3]    Total Time spent with patient: 30 minutes   Past Medical History:  Past Medical History  Diagnosis Date  . Hypertension   . Hyperlipemia 07/29/14  . Chronic low back pain 07/29/14  . Spinal stenosis 07/29/14  . GERD (gastroesophageal reflux disease)   . Depression   . Esophageal spasm 07/29/14  . Bilateral carpal tunnel syndrome 07/27/14  . Degenerative arthritis of lumbar spine 07/27/14    Past Surgical History  Procedure Laterality Date  . Hip surgery Right   . Replacement total knee bilateral Bilateral 07/27/14  . Inner ear surgery Right    Family History:  Family History  Problem Relation Age of Onset  . Family history unknown: Yes   Social History:  History  Alcohol Use No     History  Drug Use No    History   Social History  . Marital Status: Single    Spouse Name: N/A  . Number of Children: N/A  . Years of Education: N/A   Social History Main Topics  . Smoking status: Former Smoker    Types: Cigarettes     Quit date: 07/29/1975  . Smokeless tobacco: Not on file  . Alcohol Use: No  . Drug Use: No  . Sexual Activity: No     Comment: postmenopause   Other Topics Concern  . None   Social History Narrative   Additional History:    Sleep: Fair  Appetite:  Fair   Assessment:   Musculoskeletal: Strength & Muscle Tone: decreased Gait & Station: unsteady Patient leans: N/A   Psychiatric Specialty Exam: Physical Exam  Constitutional: She appears well-developed and well-nourished. She appears lethargic. She has a sickly appearance.  HENT:  Head: Normocephalic and atraumatic.  Eyes: Conjunctivae are normal. Pupils are equal, round, and reactive to light.  Neck: Normal range of motion.  Cardiovascular: Normal heart sounds.   Respiratory: Effort normal.  GI: Soft.  Musculoskeletal: Normal range of motion.  Neurological: She appears lethargic.  Skin: Skin is warm and dry.  Psychiatric: Her mood appears anxious. Her speech is delayed. She is withdrawn. Thought content is delusional. Cognition and memory are impaired. She expresses inappropriate judgment. She exhibits a depressed mood. She exhibits abnormal recent memory.  Patient was evaluated this evening. She was sitting up in her wheelchair well dressed. Awake. Made some eye contact and where she was. Still very confused and paranoid and withdrawn however. Speech minimal.    Review of Systems  Constitutional: Negative.   HENT: Negative.   Eyes: Negative.  Respiratory: Negative.   Cardiovascular: Negative.   Gastrointestinal: Negative.   Musculoskeletal: Negative.   Skin: Negative.   Neurological: Negative.   Psychiatric/Behavioral: Positive for depression. Negative for suicidal ideas and substance abuse. The patient is nervous/anxious and has insomnia.     Blood pressure 119/68, pulse 81, temperature 99.2 F (37.3 C), temperature source Oral, resp. rate 20, height 5' 1" (1.549 m), weight 68.04 kg (150 lb), SpO2 99 %.Body  mass index is 28.36 kg/(m^2).  General Appearance: Fairly Groomed  Engineer, water::  Fair  Speech:  Blocked and Slurred  Volume:  Decreased  Mood:  Depressed  Affect:  Depressed  Thought Process:  Tangential  Orientation:  Negative  Thought Content:  Delusions  Suicidal Thoughts:  No  Homicidal Thoughts:  No  Memory:  Immediate;   Fair Recent;   Poor Remote;   Fair  Judgement:  Impaired  Insight:  Lacking  Psychomotor Activity:  Decreased  Concentration:  Poor  Recall:  Poor  Fund of Knowledge:Fair  Language: Fair  Akathisia:  No  Handed:  Right  AIMS (if indicated):     Assets:  Financial Resources/Insurance Housing Social Support  ADL's:  Impaired  Cognition: Impaired,  Moderate  Sleep:  Number of Hours: 5.75     Current Medications: Current Facility-Administered Medications  Medication Dose Route Frequency Provider Last Rate Last Dose  . acetaminophen (TYLENOL) tablet 650 mg  650 mg Oral Q6H PRN Gonzella Lex, MD   650 mg at 09/17/14 1045  . acetaminophen-codeine (TYLENOL #3) 300-30 MG per tablet 1 tablet  1 tablet Oral Q8H PRN Gonzella Lex, MD      . alum & mag hydroxide-simeth (MAALOX/MYLANTA) 200-200-20 MG/5ML suspension 30 mL  30 mL Oral Q4H PRN Gonzella Lex, MD      . ciprofloxacin (CIPRO) tablet 500 mg  500 mg Oral BID Gonzella Lex, MD   500 mg at 09/19/14 0847  . feeding supplement (ENSURE ENLIVE) (ENSURE ENLIVE) liquid 237 mL  237 mL Oral BID BM Marjie Skiff, MD      . hydrochlorothiazide (MICROZIDE) capsule 12.5 mg  12.5 mg Oral Daily Gonzella Lex, MD   12.5 mg at 09/19/14 0951  . losartan (COZAAR) tablet 50 mg  50 mg Oral Daily Gonzella Lex, MD   50 mg at 09/19/14 0951  . magnesium hydroxide (MILK OF MAGNESIA) suspension 30 mL  30 mL Oral Daily PRN Gonzella Lex, MD      . mirtazapine (REMERON) tablet 45 mg  45 mg Oral QHS Gonzella Lex, MD   45 mg at 09/17/14 2125  . OLANZapine (ZYPREXA) tablet 20 mg  20 mg Oral QHS Gonzella Lex, MD   20 mg  at 09/18/14 2217  . oxyCODONE-acetaminophen (PERCOCET/ROXICET) 5-325 MG per tablet 1 tablet  1 tablet Oral Q4H PRN Gonzella Lex, MD       And  . oxyCODONE (Oxy IR/ROXICODONE) immediate release tablet 5 mg  5 mg Oral Q4H PRN Gonzella Lex, MD      . pantoprazole (PROTONIX) EC tablet 40 mg  40 mg Oral BID Gonzella Lex, MD   40 mg at 09/19/14 0951  . potassium chloride SA (K-DUR,KLOR-CON) CR tablet 20 mEq  20 mEq Oral Daily Gonzella Lex, MD   20 mEq at 09/19/14 0951  . QUEtiapine (SEROQUEL) tablet 300 mg  300 mg Oral QHS Gonzella Lex, MD   300 mg at 09/17/14 2124  .  sertraline (ZOLOFT) tablet 200 mg  200 mg Oral Daily Gonzella Lex, MD   200 mg at 09/19/14 0951  . simvastatin (ZOCOR) tablet 40 mg  40 mg Oral q1800 Gonzella Lex, MD   40 mg at 09/17/14 1713    Lab Results:  Results for orders placed or performed during the hospital encounter of 09/17/14 (from the past 48 hour(s))  Hemoglobin A1c     Status: None   Collection Time: 09/18/14  6:55 AM  Result Value Ref Range   Hgb A1c MFr Bld 5.6 4.0 - 6.0 %  Lipid panel, fasting     Status: Abnormal   Collection Time: 09/18/14  6:55 AM  Result Value Ref Range   Cholesterol 149 0 - 200 mg/dL   Triglycerides 94 <150 mg/dL   HDL 33 (L) >40 mg/dL   Total CHOL/HDL Ratio 4.5 RATIO   VLDL 19 0 - 40 mg/dL   LDL Cholesterol 97 0 - 99 mg/dL    Comment:        Total Cholesterol/HDL:CHD Risk Coronary Heart Disease Risk Table                     Men   Women  1/2 Average Risk   3.4   3.3  Average Risk       5.0   4.4  2 X Average Risk   9.6   7.1  3 X Average Risk  23.4   11.0        Use the calculated Patient Ratio above and the CHD Risk Table to determine the patient's CHD Risk.        ATP III CLASSIFICATION (LDL):  <100     mg/dL   Optimal  100-129  mg/dL   Near or Above                    Optimal  130-159  mg/dL   Borderline  160-189  mg/dL   High  >190     mg/dL   Very High   TSH     Status: None   Collection Time:  09/18/14  6:55 AM  Result Value Ref Range   TSH 1.190 0.350 - 4.500 uIU/mL    Physical Findings: AIMS: Facial and Oral Movements Muscles of Facial Expression: None, normal Lips and Perioral Area: None, normal Jaw: None, normal Tongue: None, normal,Extremity Movements Upper (arms, wrists, hands, fingers): None, normal Lower (legs, knees, ankles, toes): None, normal, Trunk Movements Neck, shoulders, hips: None, normal, Overall Severity Severity of abnormal movements (highest score from questions above): None, normal Incapacitation due to abnormal movements: None, normal, Dental Status Current problems with teeth and/or dentures?: Yes (poor dental hygiene) Does patient usually wear dentures?:  (unable to respond)  CIWA:    COWS:     Treatment Plan Summary: Medication management and Plan Patient received an ECT treatment today which she tolerated well. She looks a little bit more alert and interactive this evening but is still clearly very impaired. Plan is to continue antidepressives and anti-psychotic medication as she was taking outpatient while continuing ECT treatment. Next treatment scheduled for Monday. Supportive counseling completed. Met and spoke with the husband as well this morning who agrees with the plan.  Continue ECT as per Dr. Carlena Hurl.  After seeing patient nursing came and informed writer that patient had taken off all of her close and gone to the day room. Patient is already on several antipsychotic medications. Will avoid  benzodiazepines at this point as do not want to make her unsteady. We will prescribe some Vistaril as needed and see if this calms her.  Medical Decision Making:  Review of Psycho-Social Stressors (1), Review and summation of old records (2), Established Problem, Worsening (2), Review of Medication Regimen & Side Effects (2) and Review of New Medication or Change in Dosage (2)     Faith Rogue 09/19/2014, 3:14 PM

## 2014-09-19 NOTE — Progress Notes (Signed)
D: Pt denies SI/HI/AVH, but noted to be responding to internal stimuli. Pt is irritable and suspicious of staff, in addition  She refused medication and isolates to room.  Patient reported "she feels better from doing ECT, and does not need medication".  A: Pt was offered support and encouragement. Pt was offerred  scheduled medications but she refused, Pt was encouraged to attend groups. Q 15 minute checks were done for safety.  R: Pt  did not attend group, patient is not  receptive to treatment , safety maintained on unit.

## 2014-09-19 NOTE — [facility] Group Notes (Signed)
[facility] Group Notes:  (Nursing/MHT/Case Management/Adjunct)  Date:  09/19/2014  Time:  2:29 PM  Type of Therapy:  Group Therapy  Participation Level:  Did Not Attend  Summary of Progress/Problems:  Jenna Dudley Jenna Dudley 09/19/2014, 2:29 PM

## 2014-09-19 NOTE — Progress Notes (Signed)
Mood and coherency improved while visiting with husband and doctor.  Appetite better with dinner, ate almost all of dinner and drank insure. Speech slurred.  Safe environment maintained.

## 2014-09-19 NOTE — Plan of Care (Signed)
Problem: Alteration in thought process Goal: LTG-Patient behavior demonstrates decreased signs psychosis (Patient behavior demonstrates decreased signs of psychosis to the point the patient is safe to return home and continue treatment in an outpatient setting.)  Outcome: Not Progressing Patient denies SI/HI/AVH but noted to be responding to internal stimuli.  Goal: STG-Patient is able to follow short directions Outcome: Progressing Patient able to follow simple commands.

## 2014-09-19 NOTE — Progress Notes (Signed)
Patient undressed and walked out of room down to dayroom.  Escorted back to room by staff.  When questioned patient states "she told me too"  When asked who is she, states "Hell if I know"  Dr. Mayford Knife informed.  Orders received.  Medicated with hydroxizne 50 mg po.

## 2014-09-19 NOTE — Plan of Care (Signed)
Problem: Alteration in thought process Goal: LTG-Patient has not harmed self or others in at least 2 days Outcome: Progressing No self harm, denies wanting to harm self

## 2014-09-19 NOTE — Plan of Care (Signed)
Problem: Alteration in thought process Goal: LTG-Patient is able to perceive the environment accurately Outcome: Not Met (add Reason) Patient totally undressed and walked out of room down to day room naked.

## 2014-09-19 NOTE — Plan of Care (Signed)
Problem: Alteration in mood Goal: LTG-Pt's behavior demonstrates decreased signs of depression (Patient's behavior demonstrates decreased signs of depression to the point the patient is safe to return home and continue treatment in an outpatient setting)  Outcome: Not Progressing Continues to endorse depresssion

## 2014-09-20 NOTE — Progress Notes (Signed)
Was in dayroom talking to self at onset of shift. Conversation was mostly incomprehensible. Did state a few times that being fearful of "him" or "he". Never did state a name. Was encouraged to go to room after evening snack. Did retreat to room bu wouldn't change into night clothes or get in bed. Sat in wheelchair for a couple hours before finally going to bed with encouragement. Was medication compliant.

## 2014-09-20 NOTE — Progress Notes (Signed)
[facility] MD Progress Note  09/20/2014 11:15 AM Jenna Dudley  MRN:  [id number] Subjective:  Follow-up for 79 year old woman with severe major depression in the hospital for treatment with ECT. Today patient was sleeping in her room when I approach. When I explored with her any issues or concerns she began expressing delusional material of people trying to kill her and discussed people trying to harm a "baby." She seemed to respond to reassurance that we were Dudley to try to help her. No further reports from staff of her leaving her room naked as was the case yesterday Principal Problem: Severe recurrent major depression with psychotic features Diagnosis:   Patient Active Problem List   Diagnosis Date Noted  . Infection of urinary tract [N39.0] 09/17/2014  . Essential hypertension [I10] 09/17/2014  . Chronic back pain [M54.9, G89.29] 09/17/2014  . Gastric reflux [K21.9] 09/17/2014  . History of stroke [Z86.73] 09/17/2014  . Hypertension [I10] 09/16/2014  . Severe recurrent major depression with psychotic features [F33.3]    Total Time spent with patient: 30 minutes   Past Medical History:  Past Medical History  Diagnosis Date  . Hypertension   . Hyperlipemia 07/29/14  . Chronic low back pain 07/29/14  . Spinal stenosis 07/29/14  . GERD (gastroesophageal reflux disease)   . Depression   . Esophageal spasm 07/29/14  . Bilateral carpal tunnel syndrome 07/27/14  . Degenerative arthritis of lumbar spine 07/27/14    Past Surgical History  Procedure Laterality Date  . Hip surgery Right   . Replacement total knee bilateral Bilateral 07/27/14  . Inner ear surgery Right    Family History:  Family History  Problem Relation Age of Onset  . Family history unknown: Yes   Social History:  History  Alcohol Use No     History  Drug Use No    History   Social History  . Marital Status: Single    Spouse Name: N/A  . Number of Children: N/A  . Years of Education: N/A   Social History Main Topics   . Smoking status: Former Smoker    Types: Cigarettes    Quit date: 07/29/1975  . Smokeless tobacco: Not on file  . Alcohol Use: No  . Drug Use: No  . Sexual Activity: No     Comment: postmenopause   Other Topics Concern  . None   Social History Narrative   Additional History:    Sleep: Fair  Appetite:  Fair   Assessment:   Musculoskeletal: Strength & Muscle Tone: decreased Gait & Station: unsteady Patient leans: N/A   Psychiatric Specialty Exam: Physical Exam  Constitutional: She appears well-developed and well-nourished. She appears lethargic. She has a sickly appearance.  HENT:  Head: Normocephalic and atraumatic.  Eyes: Conjunctivae are normal. Pupils are equal, round, and reactive to light.  Neck: Normal range of motion.  Cardiovascular: Normal heart sounds.   Respiratory: Effort normal.  GI: Soft.  Musculoskeletal: Normal range of motion.  Neurological: She appears lethargic.  Skin: Skin is warm and dry.  Psychiatric: Her mood appears anxious. Her speech is delayed. She is withdrawn. Thought content is delusional. Cognition and memory are impaired. She expresses inappropriate judgment. She exhibits a depressed mood. She exhibits abnormal recent memory.  Patient was evaluated this evening. She was sitting up in her wheelchair well dressed. Awake. Made some eye contact and where she was. Still very confused and paranoid and withdrawn however. Speech minimal.    Review of Systems  Constitutional: Negative.  HENT: Negative.   Eyes: Negative.   Respiratory: Negative.   Cardiovascular: Negative.   Gastrointestinal: Negative.   Musculoskeletal: Negative.   Skin: Negative.   Neurological: Negative.   Psychiatric/Behavioral: Positive for depression. Negative for suicidal ideas and substance abuse. The patient is nervous/anxious and has insomnia.     Blood pressure 140/97, pulse 97, temperature 97.6 F (36.4 C), temperature source Oral, resp. rate 20, height 5'  1" (1.549 m), weight 68.04 kg (150 lb), SpO2 99 %.Body mass index is 28.36 kg/(m^2).  General Appearance: Fairly Groomed  Patent attorney::  Fair  Speech:  Blocked and Slurred  Volume:  Decreased  Mood:  Depressed  Affect:  Depressed  Thought Process:  Tangential  Orientation:  Negative  Thought Content:  Delusions  Suicidal Thoughts:  No  Homicidal Thoughts:  No  Memory:  Immediate;   Fair Recent;   Poor Remote;   Fair  Judgement:  Impaired  Insight:  Lacking  Psychomotor Activity:  Decreased  Concentration:  Poor  Recall:  Poor  Fund of Knowledge:Fair  Language: Fair  Akathisia:  No  Handed:  Right  AIMS (if indicated):     Assets:  Financial Resources/Insurance Housing Social Support  ADL's:  Impaired  Cognition: Impaired,  Moderate  Sleep:  Number of Hours: 4.5     Current Medications: Current Facility-Administered Medications  Medication Dose Route Frequency Provider Last Rate Last Dose  . acetaminophen (TYLENOL) tablet 650 mg  650 mg Oral Q6H PRN Audery Amel, MD   650 mg at 09/17/14 1045  . acetaminophen-codeine (TYLENOL #3) 300-30 MG per tablet 1 tablet  1 tablet Oral Q8H PRN Audery Amel, MD      . alum & mag hydroxide-simeth (MAALOX/MYLANTA) 200-200-20 MG/5ML suspension 30 mL  30 mL Oral Q4H PRN Audery Amel, MD      . ciprofloxacin (CIPRO) tablet 500 mg  500 mg Oral BID Audery Amel, MD   500 mg at 09/20/14 0750  . feeding supplement (ENSURE ENLIVE) (ENSURE ENLIVE) liquid 237 mL  237 mL Oral BID BM Kerin Salen, MD   237 mL at 09/20/14 0941  . hydrochlorothiazide (MICROZIDE) capsule 12.5 mg  12.5 mg Oral Daily Audery Amel, MD   12.5 mg at 09/20/14 0941  . hydrOXYzine (ATARAX/VISTARIL) tablet 50 mg  50 mg Oral TID PRN Kerin Salen, MD   50 mg at 09/19/14 1553  . losartan (COZAAR) tablet 50 mg  50 mg Oral Daily Audery Amel, MD   50 mg at 09/20/14 0941  . magnesium hydroxide (MILK OF MAGNESIA) suspension 30 mL  30 mL Oral Daily PRN Audery Amel, MD      . mirtazapine (REMERON) tablet 45 mg  45 mg Oral QHS Audery Amel, MD   45 mg at 09/19/14 2110  . OLANZapine (ZYPREXA) tablet 20 mg  20 mg Oral QHS Audery Amel, MD   20 mg at 09/19/14 2110  . oxyCODONE-acetaminophen (PERCOCET/ROXICET) 5-325 MG per tablet 1 tablet  1 tablet Oral Q4H PRN Audery Amel, MD       And  . oxyCODONE (Oxy IR/ROXICODONE) immediate release tablet 5 mg  5 mg Oral Q4H PRN Audery Amel, MD      . pantoprazole (PROTONIX) EC tablet 40 mg  40 mg Oral BID Audery Amel, MD   40 mg at 09/20/14 0941  . potassium chloride SA (K-DUR,KLOR-CON) CR tablet 20 mEq  20 mEq Oral Daily Jonny Ruiz  T Clapacs, MD   20 mEq at 09/20/14 0941  . QUEtiapine (SEROQUEL) tablet 300 mg  300 mg Oral QHS Audery Amel, MD   300 mg at 09/19/14 2110  . sertraline (ZOLOFT) tablet 200 mg  200 mg Oral Daily Audery Amel, MD   200 mg at 09/20/14 0941  . simvastatin (ZOCOR) tablet 40 mg  40 mg Oral q1800 Audery Amel, MD   40 mg at 09/19/14 1720    Lab Results:  No results found for this or any previous visit (from the past 48 hour(s)).  Physical Findings: AIMS: Facial and Oral Movements Muscles of Facial Expression: None, normal Lips and Perioral Area: None, normal Jaw: None, normal Tongue: None, normal,Extremity Movements Upper (arms, wrists, hands, fingers): None, normal Lower (legs, knees, ankles, toes): None, normal, Trunk Movements Neck, shoulders, hips: None, normal, Overall Severity Severity of abnormal movements (highest score from questions above): None, normal Incapacitation due to abnormal movements: None, normal, Dental Status Current problems with teeth and/or dentures?: Yes (poor dental hygiene) Does patient usually wear dentures?:  (unable to respond)  CIWA:    COWS:     Treatment Plan Summary: Medication management we'll continue patient's current medications.  Continue ECT as per Dr. Delaney Meigs.  Episode of being disinhibited and wandering without any  clothes on yesterday. No further episodes. We will prescribe some Vistaril as needed and see if this calms her.  Medical Decision Making:  Review of Psycho-Social Stressors (1), Review and summation of old records (2), Established Problem, Worsening (2), Review of Medication Regimen & Side Effects (2) and Review of New Medication or Change in Dosage (2)     Jenna Dudley 09/20/2014, 11:15 AM

## 2014-09-20 NOTE — Progress Notes (Addendum)
Patient is alert but confused. She is aware she is in the hospital but does not know why. She appears very depressed with frequent episodes of crying. She is also expressing delusional thoughts.Speech is difficult to understand. Patient is not responsive to reassurance or reality orientation.  Patient is taking adequate nutrition and hydration. She was assisted with showering and bathroom. Patient is using wheelchair for mobility. She was encouraged to spend time in the milieu but prefers to stay in room. Compliant with medications.  Will continue per treatment plan, monitor mood, mental status, assist with ADLs. Maintain all safety precautions.

## 2014-09-20 NOTE — Progress Notes (Signed)
Patient ID: Jenna Dudley, female   DOB: [birth year], 79 y.o.   MRN: [id number]  CSW attempted to complete assessment. Pt was unable to participate due to current presentation.   Daisy Floro Kiandria Clum MSW, LCSWA  09/20/2014 4:02 PM

## 2014-09-20 NOTE — [facility] Group Notes (Signed)
[facility] LCSW Group Therapy  09/20/2014 3:57 PM  Type of Therapy:  Group Therapy  Participation Level:  Did Not Attend  Modes of Intervention:  Discussion, Education, Role-play, Socialization and Support  Summary of Progress/Problems:Healthy Communication: Pt will discuss the importance of communication. They will be encouraged to share examples of unhealthy communication they have experienced. Also, they will be encouraged to provide strategies for healthy communication.    Daisy Floro Maynor Mwangi MSW, LCSWA  09/20/2014, 3:57 PM

## 2014-09-21 ENCOUNTER — Inpatient Hospital Stay: Payer: Medicare Other | Admitting: Anesthesiology

## 2014-09-21 ENCOUNTER — Encounter: Payer: Self-pay | Admitting: Anesthesiology

## 2014-09-21 ENCOUNTER — Other Ambulatory Visit: Payer: Self-pay | Admitting: *Deleted

## 2014-09-21 ENCOUNTER — Inpatient Hospital Stay: Payer: Medicare Other

## 2014-09-21 LAB — CBC WITH DIFFERENTIAL/PLATELET
BASOS ABS: 0 10*3/uL (ref 0–0.1)
Basophils Relative: 1 %
EOS PCT: 3 %
Eosinophils Absolute: 0.2 10*3/uL (ref 0–0.7)
HEMATOCRIT: 36.9 % (ref 35.0–47.0)
Hemoglobin: 12.1 g/dL (ref 12.0–16.0)
Lymphocytes Relative: 10 %
Lymphs Abs: 0.6 10*3/uL — ABNORMAL LOW (ref 1.0–3.6)
MCH: 28.7 pg (ref 26.0–34.0)
MCHC: 32.9 g/dL (ref 32.0–36.0)
MCV: 87.2 fL (ref 80.0–100.0)
MONO ABS: 0.5 10*3/uL (ref 0.2–0.9)
Monocytes Relative: 9 %
Neutro Abs: 4.7 10*3/uL (ref 1.4–6.5)
Neutrophils Relative %: 77 %
PLATELETS: 222 10*3/uL (ref 150–440)
RBC: 4.24 MIL/uL (ref 3.80–5.20)
RDW: 14 % (ref 11.5–14.5)
WBC: 6 10*3/uL (ref 3.6–11.0)

## 2014-09-21 LAB — URINALYSIS COMPLETE WITH MICROSCOPIC (ARMC ONLY)
BACTERIA UA: NONE SEEN
Bilirubin Urine: NEGATIVE
GLUCOSE, UA: 150 mg/dL — AB
HGB URINE DIPSTICK: NEGATIVE
Leukocytes, UA: NEGATIVE
Nitrite: NEGATIVE
Protein, ur: NEGATIVE mg/dL
SPECIFIC GRAVITY, URINE: 1.014 (ref 1.005–1.030)
SQUAMOUS EPITHELIAL / LPF: NONE SEEN
pH: 7 (ref 5.0–8.0)

## 2014-09-21 LAB — BASIC METABOLIC PANEL
Anion gap: 7 (ref 5–15)
BUN: 19 mg/dL (ref 6–20)
CO2: 28 mmol/L (ref 22–32)
Calcium: 8.5 mg/dL — ABNORMAL LOW (ref 8.9–10.3)
Chloride: 99 mmol/L — ABNORMAL LOW (ref 101–111)
Creatinine, Ser: 0.78 mg/dL (ref 0.44–1.00)
GFR calc Af Amer: >60 mL/min (ref >60)
GLUCOSE: 298 mg/dL — AB (ref 65–99)
Potassium: 3.2 mmol/L — ABNORMAL LOW (ref 3.5–5.1)
Sodium: 134 mmol/L — ABNORMAL LOW (ref 135–145)

## 2014-09-21 LAB — GLUCOSE, CAPILLARY
Glucose-Capillary: 99 mg/dL (ref 65–99)
Glucose-Capillary: 96 mg/dL (ref 65–99)

## 2014-09-21 MED ORDER — DEXTROSE 5 % IV SOLN: 250.0000 mL | Freq: Once | INTRAVENOUS | Status: DC

## 2014-09-21 MED ORDER — SUCCINYLCHOLINE CHLORIDE 20 MG/ML IJ SOLN
80.0000 mg | Freq: Once | INTRAMUSCULAR | Status: AC
  Administered 2014-09-21: 80 mg via INTRAVENOUS

## 2014-09-21 MED ORDER — DEXTROSE 5 % IN LACTATED RINGERS IV BOLUS
1000.0000 mL | Freq: Once | INTRAVENOUS | Status: AC
  Administered 2014-09-21: 1000 mL via INTRAVENOUS

## 2014-09-21 MED ORDER — CEPHALEXIN 500 MG PO CAPS
500.0000 mg | ORAL_CAPSULE | Freq: Three times a day (TID) | ORAL | Status: AC
  Administered 2014-09-21 – 2014-09-26 (×13): 500 mg via ORAL
  Filled 2014-09-21 (×16): qty 1

## 2014-09-21 MED ORDER — LABETALOL HCL 5 MG/ML IV SOLN
20.0000 mg | Freq: Once | INTRAVENOUS | Status: AC
  Administered 2014-09-21: 20 mg via INTRAVENOUS

## 2014-09-21 MED ORDER — METHOHEXITAL SODIUM 100 MG/10ML IV SOSY: 60.0000 mg | PREFILLED_SYRINGE | Freq: Once | INTRAVENOUS | Status: DC

## 2014-09-21 MED ORDER — SUCCINYLCHOLINE CHLORIDE 20 MG/ML IJ SOLN: 80.0000 mg | Freq: Once | INTRAMUSCULAR | Status: DC

## 2014-09-21 MED ORDER — ONDANSETRON HCL 4 MG/2ML IJ SOLN: 4.0000 mg | Freq: Once | INTRAMUSCULAR | Status: DC | PRN

## 2014-09-21 MED ORDER — METHOHEXITAL SODIUM 100 MG/10ML IV SOSY
60.0000 mg | PREFILLED_SYRINGE | Freq: Once | INTRAVENOUS | Status: AC
  Administered 2014-09-21: 60 mg via INTRAVENOUS

## 2014-09-21 MED ORDER — DEXTROSE 5 % IV SOLN
250.0000 mL | Freq: Once | INTRAVENOUS | Status: AC
  Administered 2014-09-21: 250 mL via INTRAVENOUS

## 2014-09-21 MED ORDER — KETOROLAC TROMETHAMINE 30 MG/ML IJ SOLN
30.0000 mg | Freq: Once | INTRAMUSCULAR | Status: AC
  Administered 2014-09-21: 30 mg via INTRAVENOUS

## 2014-09-21 MED ORDER — FENTANYL CITRATE (PF) 100 MCG/2ML IJ SOLN: 25.0000 ug | INTRAMUSCULAR | Status: DC | PRN

## 2014-09-21 NOTE — Transfer of Care (Signed)
Immediate Anesthesia Transfer of Care Note  Patient: Jenna Dudley  Procedure(s) Performed: * No procedures listed *  Patient Location: PACU  Anesthesia Type:General  Level of Consciousness: sedated  Airway & Oxygen Therapy: Patient Spontanous Breathing and Patient connected to face mask oxygen  Post-op Assessment: Report given to RN and Post -op Vital signs reviewed and stable  Post vital signs: Reviewed and stable  Last Vitals:  Filed Vitals:   09/21/14 1149  BP: 112/67  Pulse: 77  Temp: 37.3 C  Resp: 24    Complications: No apparent anesthesia complications

## 2014-09-21 NOTE — Anesthesia Preprocedure Evaluation (Signed)
Anesthesia Evaluation  Patient identified by MRN, date of birth, ID band Patient awake    Reviewed: Allergy & Precautions, NPO status , Patient's Chart, lab work & pertinent test results  Airway Mallampati: II  TM Distance: >3 FB Neck ROM: Limited    Dental  (+) Poor Dentition   Pulmonary former smoker,  breath sounds clear to auscultation  Pulmonary exam normal       Cardiovascular hypertension, Normal cardiovascular exam    Neuro/Psych PSYCHIATRIC DISORDERS Depression  Neuromuscular disease    GI/Hepatic Neg liver ROS, GERD-  Medicated and Controlled,  Endo/Other  negative endocrine ROS  Renal/GU negative Renal ROS     Musculoskeletal  (+) Arthritis -, Osteoarthritis,    Abdominal Normal abdominal exam  (+)   Peds  Hematology negative hematology ROS (+)   Anesthesia Other Findings Uncooperative this am. Pt. Crying.  Reproductive/Obstetrics                             Anesthesia Physical  Anesthesia Plan  ASA: III  Anesthesia Plan: General   Post-op Pain Management:    Induction: Intravenous  Airway Management Planned: Simple Face Mask  Additional Equipment:   Intra-op Plan:   Post-operative Plan:   Informed Consent: I have reviewed the patients History and Physical, chart, labs and discussed the procedure including the risks, benefits and alternatives for the proposed anesthesia with the patient or authorized representative who has indicated his/her understanding and acceptance.   Dental advisory given  Plan Discussed with:   Anesthesia Plan Comments:         Anesthesia Quick Evaluation

## 2014-09-21 NOTE — Plan of Care (Signed)
Problem: [facility] Concurrent Medical Problem Goal: STG-Compliance with medication and/or treatment as ordered (STG-Compliance with medication and/or treatment as ordered by MD)  Outcome: Progressing Patient is compliant with meds.

## 2014-09-21 NOTE — Progress Notes (Signed)
In and out urine cath performed by sterile technique per order by Dr. Toni Amendlapacs.

## 2014-09-21 NOTE — Progress Notes (Signed)
Patient tolerated ECT today.She got mild confusion & is disorganized.When called her for MRI she put both her hands on her head crying & states "I don't want to do this."She states "[place]She crys for anything asked says "I don't know."Appetite good.Compliant with meds.Ambulated with minimal assistance.

## 2014-09-21 NOTE — Progress Notes (Signed)
D:  Patient affect and mood are anxious.  Patient speech is tangential with flight of ideas.  Patient began crying when approached by staff stating, "Please just leave me alone. Let me lay right here."  Patient was kept NPO for ECT this morning.  Patient did NOT attend evening group. Patient visible on the milieu. No distress noted. A: Support and encouragement offered. Scheduled medications given to pt. Q 15 min checks continued for patient safety. R: Patient receptive. Patient remains safe on the unit.

## 2014-09-21 NOTE — [facility] Group Notes (Signed)
[facility] Group Notes:  (Nursing/MHT/Case Management/Adjunct)  Date:  09/21/2014  Time:  5:11 AM  Type of Therapy:  Evening Wrap-up Group/Outside  Participation Level:  Did Not Attend  Participation Quality:  N/A  Affect:  N/A  Cognitive:  N/A  Insight:  None  Engagement in Group:  None  Modes of Intervention:  Activity  Summary of Progress/Problems:  Tomasita MorrowChelsea Nanta Graycee Greeson 09/21/2014, 5:11 AM

## 2014-09-21 NOTE — [facility] Group Notes (Signed)
[facility] LCSW Group Therapy  09/21/2014 5:44 PM  Type of Therapy:  Group Therapy  Participation Level:  Did Not Attend  Participation Quality:    Affect:    Cognitive:    Insight:    Engagement in Therapy:    Modes of Intervention:    Summary of Progress/Problems:  Jenna Dudley 09/21/2014, 5:44 PM

## 2014-09-21 NOTE — Procedures (Signed)
[facility] Physician's Interval Evaluation & Treatment Note  Patient Identification: Jenna Dudley MRN:  [id number] Date of Evaluation:  09/21/2014 TX #: [id number]  MADRS:   MMSE:   P.E. Findings:  Patient appears to be in physical pain probably in her back but she is unable to give very coherent information  Psychiatric Interval Note:  Appears to be more confused and clearly distressed today.  Subjective:  Patient is a 79 y.o. female seen for evaluation for Electroconvulsive Therapy. States that she is in pain but won't specify in what manner  Treatment Summary:   [x]   Right Unilateral             []  Bilateral   % Energy : 0.3 ms 100%   Impedance: 1730 ohms  Seizure Energy Index: 4864 V squared  Postictal Suppression Index: 67%  Seizure Concordance Index: 93%  Medications  Pre Shock: Labetalol 20 mg, Toradol 30 mg, Brevital 60 mg, succinylcholine 80 mg  Post Shock:    Seizure Duration: 19 seconds by EMG, 44 seconds by EEG   Comments: Next treatment Wednesday. Patient is not nearly ready for discharge.   Lungs:  [x]   Clear to auscultation               []  Other:   Heart:    [x]   Regular rhythm             []  irregular rhythm    [x]   Previous H&P reviewed, patient examined and there are NO CHANGES                 []   Previous H&P reviewed, patient examined and there are changes noted.   Mordecai RasmussenJohn Jamani Eley, MD 6/27/201610:48 AM

## 2014-09-21 NOTE — Progress Notes (Signed)
[facility] MD Progress Note  09/21/2014 6:34 PM Jenna Dudley  MRN:  [id number] Subjective:  Follow-up patient with a history of severe major depression with psychotic features. She appeared this morning to be as decompensated as I had ever seen her. She was unable to hold any kind of conversation and was sobbing and hysterical. Seem to be responding to internal stimuli. ECT treatment was performed without difficulty or complication. Urine analysis was obtained during that to see if she might still be having a UTI. This came back as being negative. On interview this afternoon she is sitting up and ate dinner but is still unable to converse in a back-and-forth fashion. Still tearful and appears delusional. I ordered an MRI to see if anything remarkable at changed in her brain and we will see that when it comes back. Otherwise labs showed a little bit of low potassium but she is already on a supplement of that. yesterday Principal Problem: Severe recurrent major depression with psychotic features Diagnosis:   Patient Active Problem List   Diagnosis Date Noted  . Infection of urinary tract [N39.0] 09/17/2014  . Essential hypertension [I10] 09/17/2014  . Chronic back pain [M54.9, G89.29] 09/17/2014  . Gastric reflux [K21.9] 09/17/2014  . History of stroke [Z86.73] 09/17/2014  . Hypertension [I10] 09/16/2014  . Severe recurrent major depression with psychotic features [F33.3]    Total Time spent with patient: 30 minutes   Past Medical History:  Past Medical History  Diagnosis Date  . Hypertension   . Hyperlipemia 07/29/14  . Chronic low back pain 07/29/14  . Spinal stenosis 07/29/14  . GERD (gastroesophageal reflux disease)   . Depression   . Esophageal spasm 07/29/14  . Bilateral carpal tunnel syndrome 07/27/14  . Degenerative arthritis of lumbar spine 07/27/14    Past Surgical History  Procedure Laterality Date  . Hip surgery Right   . Replacement total knee bilateral Bilateral 07/27/14  . Inner ear  surgery Right    Family History:  Family History  Problem Relation Age of Onset  . Family history unknown: Yes   Social History:  History  Alcohol Use No     History  Drug Use No    History   Social History  . Marital Status: Single    Spouse Name: N/A  . Number of Children: N/A  . Years of Education: N/A   Social History Main Topics  . Smoking status: Former Smoker    Types: Cigarettes    Quit date: 07/29/1975  . Smokeless tobacco: Not on file  . Alcohol Use: No  . Drug Use: No  . Sexual Activity: No     Comment: postmenopause   Other Topics Concern  . None   Social History Narrative   Additional History:    Sleep: Fair  Appetite:  Fair   Assessment:   Musculoskeletal: Strength & Muscle Tone: decreased Gait & Station: unsteady Patient leans: N/A   Psychiatric Specialty Exam: Physical Exam  Constitutional: She appears well-developed and well-nourished. She appears lethargic. She has a sickly appearance.  HENT:  Head: Normocephalic and atraumatic.  Eyes: Conjunctivae are normal. Pupils are equal, round, and reactive to light.  Neck: Normal range of motion.  Cardiovascular: Normal heart sounds.   Respiratory: Effort normal.  GI: Soft.  Musculoskeletal: Normal range of motion.  Neurological: She appears lethargic.  Skin: Skin is warm and dry.  Psychiatric: Her mood appears anxious. Her speech is delayed. She is withdrawn. Thought content is delusional. Cognition and  memory are impaired. She expresses inappropriate judgment. She exhibits a depressed mood. She exhibits abnormal recent memory.  Patient was evaluated this evening. She was sitting up in her wheelchair well dressed. Awake. Made some eye contact and where she was. Still very confused and paranoid and withdrawn however. Speech minimal.    Review of Systems  Constitutional: Negative.   HENT: Negative.   Eyes: Negative.   Respiratory: Negative.   Cardiovascular: Negative.    Gastrointestinal: Negative.   Musculoskeletal: Negative.   Skin: Negative.   Neurological: Negative.   Psychiatric/Behavioral: Positive for depression. Negative for suicidal ideas and substance abuse. The patient is nervous/anxious and has insomnia.     Blood pressure 128/66, pulse 82, temperature 98.2 F (36.8 C), temperature source Oral, resp. rate 20, height '5\' 1"'  (1.549 m), weight 68.947 kg (152 lb), SpO2 94 %.Body mass index is 28.74 kg/(m^2).  General Appearance: Fairly Groomed  Engineer, water::  Fair  Speech:  Blocked and Slurred  Volume:  Decreased  Mood:  Depressed  Affect:  Depressed  Thought Process:  Tangential  Orientation:  Negative  Thought Content:  Delusions  Suicidal Thoughts:  No  Homicidal Thoughts:  No  Memory:  Immediate;   Fair Recent;   Poor Remote;   Fair  Judgement:  Impaired  Insight:  Lacking  Psychomotor Activity:  Decreased  Concentration:  Poor  Recall:  Poor  Fund of Knowledge:Fair  Language: Fair  Akathisia:  No  Handed:  Right  AIMS (if indicated):     Assets:  Financial Resources/Insurance Housing Social Support  ADL's:  Impaired  Cognition: Impaired,  Moderate  Sleep:  Number of Hours: 7.75     Current Medications: Current Facility-Administered Medications  Medication Dose Route Frequency Provider Last Rate Last Dose  . acetaminophen (TYLENOL) tablet 650 mg  650 mg Oral Q6H PRN Gonzella Lex, MD   650 mg at 09/17/14 1045  . acetaminophen-codeine (TYLENOL #3) 300-30 MG per tablet 1 tablet  1 tablet Oral Q8H PRN Gonzella Lex, MD      . alum & mag hydroxide-simeth (MAALOX/MYLANTA) 200-200-20 MG/5ML suspension 30 mL  30 mL Oral Q4H PRN Gonzella Lex, MD      . cephALEXin (KEFLEX) capsule 500 mg  500 mg Oral 3 times per day Gonzella Lex, MD   500 mg at 09/21/14 1323  . dextrose 5 % solution 250 mL  250 mL Intravenous Once Gonzella Lex, MD      . feeding supplement (ENSURE ENLIVE) (ENSURE ENLIVE) liquid 237 mL  237 mL Oral BID BM  Marjie Skiff, MD   237 mL at 09/21/14 1400  . hydrochlorothiazide (MICROZIDE) capsule 12.5 mg  12.5 mg Oral Daily Gonzella Lex, MD   12.5 mg at 09/21/14 2841  . hydrOXYzine (ATARAX/VISTARIL) tablet 50 mg  50 mg Oral TID PRN Marjie Skiff, MD   50 mg at 09/19/14 1553  . losartan (COZAAR) tablet 50 mg  50 mg Oral Daily Gonzella Lex, MD   50 mg at 09/21/14 0701  . magnesium hydroxide (MILK OF MAGNESIA) suspension 30 mL  30 mL Oral Daily PRN Gonzella Lex, MD      . methohexital Sodium 60 mg  60 mg Intravenous Once Gonzella Lex, MD      . mirtazapine (REMERON) tablet 45 mg  45 mg Oral QHS Gonzella Lex, MD   45 mg at 09/20/14 2100  . OLANZapine (ZYPREXA) tablet 20 mg  20  mg Oral QHS Gonzella Lex, MD   20 mg at 09/20/14 2100  . oxyCODONE-acetaminophen (PERCOCET/ROXICET) 5-325 MG per tablet 1 tablet  1 tablet Oral Q4H PRN Gonzella Lex, MD       And  . oxyCODONE (Oxy IR/ROXICODONE) immediate release tablet 5 mg  5 mg Oral Q4H PRN Gonzella Lex, MD      . pantoprazole (PROTONIX) EC tablet 40 mg  40 mg Oral BID Gonzella Lex, MD   40 mg at 09/21/14 0701  . potassium chloride SA (K-DUR,KLOR-CON) CR tablet 20 mEq  20 mEq Oral Daily Gonzella Lex, MD   20 mEq at 09/21/14 1323  . QUEtiapine (SEROQUEL) tablet 300 mg  300 mg Oral QHS Gonzella Lex, MD   300 mg at 09/20/14 2100  . sertraline (ZOLOFT) tablet 200 mg  200 mg Oral Daily Gonzella Lex, MD   200 mg at 09/21/14 1323  . simvastatin (ZOCOR) tablet 40 mg  40 mg Oral q1800 Gonzella Lex, MD   40 mg at 09/21/14 1701  . succinylcholine (ANECTINE) injection 80 mg  80 mg Intravenous Once Gonzella Lex, MD        Lab Results:  Results for orders placed or performed during the hospital encounter of 09/17/14 (from the past 48 hour(s))  Urinalysis complete, with microscopic ([facility] only)     Status: Abnormal   Collection Time: 09/21/14 12:21 PM  Result Value Ref Range   Color, Urine YELLOW (A) YELLOW   APPearance CLEAR (A) CLEAR    Glucose, UA 150 (A) NEGATIVE mg/dL   Bilirubin Urine NEGATIVE NEGATIVE   Ketones, ur TRACE (A) NEGATIVE mg/dL   Specific Gravity, Urine 1.014 1.005 - 1.030   Hgb urine dipstick NEGATIVE NEGATIVE   pH 7.0 5.0 - 8.0   Protein, ur NEGATIVE NEGATIVE mg/dL   Nitrite NEGATIVE NEGATIVE   Leukocytes, UA NEGATIVE NEGATIVE   RBC / HPF 0-5 0 - 5 RBC/hpf   WBC, UA 0-5 0 - 5 WBC/hpf   Bacteria, UA NONE SEEN NONE SEEN   Squamous Epithelial / LPF NONE SEEN NONE SEEN   Mucous PRESENT    Hyaline Casts, UA PRESENT   CBC with Differential/Platelet     Status: Abnormal   Collection Time: 09/21/14 12:43 PM  Result Value Ref Range   WBC 6.0 3.6 - 11.0 K/uL   RBC 4.24 3.80 - 5.20 MIL/uL   Hemoglobin 12.1 12.0 - 16.0 g/dL   HCT 36.9 35.0 - 47.0 %   MCV 87.2 80.0 - 100.0 fL   MCH 28.7 26.0 - 34.0 pg   MCHC 32.9 32.0 - 36.0 g/dL   RDW 14.0 11.5 - 14.5 %   Platelets 222 150 - 440 K/uL   Neutrophils Relative % 77 %   Neutro Abs 4.7 1.4 - 6.5 K/uL   Lymphocytes Relative 10 %   Lymphs Abs 0.6 (L) 1.0 - 3.6 K/uL   Monocytes Relative 9 %   Monocytes Absolute 0.5 0.2 - 0.9 K/uL   Eosinophils Relative 3 %   Eosinophils Absolute 0.2 0 - 0.7 K/uL   Basophils Relative 1 %   Basophils Absolute 0.0 0 - 0.1 K/uL  Basic metabolic panel     Status: Abnormal   Collection Time: 09/21/14 12:43 PM  Result Value Ref Range   Sodium 134 (L) 135 - 145 mmol/L   Potassium 3.2 (L) 3.5 - 5.1 mmol/L   Chloride 99 (L) 101 - 111 mmol/L  CO2 28 22 - 32 mmol/L   Glucose, Bld 298 (H) 65 - 99 mg/dL   BUN 19 6 - 20 mg/dL   Creatinine, Ser 0.78 0.44 - 1.00 mg/dL   Calcium 8.5 (L) 8.9 - 10.3 mg/dL   GFR calc non Af Amer >60 >60 mL/min   GFR calc Af Amer >60 >60 mL/min    Comment: (NOTE) The eGFR has been calculated using the CKD EPI equation. This calculation has not been validated in all clinical situations. eGFR's persistently <60 mL/min signify possible Chronic Kidney Disease.    Anion gap 7 5 - 15  Glucose, capillary      Status: None   Collection Time: 09/21/14  4:59 PM  Result Value Ref Range   Glucose-Capillary 99 65 - 99 mg/dL    Physical Findings: AIMS: Facial and Oral Movements Muscles of Facial Expression: None, normal Lips and Perioral Area: None, normal Jaw: None, normal Tongue: None, normal,Extremity Movements Upper (arms, wrists, hands, fingers): None, normal Lower (legs, knees, ankles, toes): None, normal, Trunk Movements Neck, shoulders, hips: None, normal, Overall Severity Severity of abnormal movements (highest score from questions above): None, normal Incapacitation due to abnormal movements: None, normal, Dental Status Current problems with teeth and/or dentures?: Yes (poor dental hygiene) Does patient usually wear dentures?:  (unable to respond)  CIWA:    COWS:     Treatment Plan Summary: Medication management we'll continue patient's current medications.  Continues very confused. Presume this is part of her ongoing illness of recurrent psychotic depression. Looking for other medical causes as well. I noticed also that her blood sugars were very elevated which could be from the D5 fluid that we gave her during treatment. I will check her sugars 4 times a day before making any other decision about that.  Medical Decision Making:  Review of Psycho-Social Stressors (1), Review and summation of old records (2), Established Problem, Worsening (2), Review of Medication Regimen & Side Effects (2) and Review of New Medication or Change in Dosage (2)     Avari Gelles 09/21/2014, 6:34 PM

## 2014-09-21 NOTE — [facility] Group Notes (Addendum)
[facility]  09/21/2014 5:45 PM  Participation Quality:  Did not attend ECT  Affect:    Cognitive:    Insight:    Engagement in Group:    Modes of Intervention:    Summary of Progress/Problems:  Cheron SchaumannBandi, Jenna Dudley 09/21/2014, 5:45 PM

## 2014-09-21 NOTE — Anesthesia Postprocedure Evaluation (Signed)
  Anesthesia Post-op Note  Patient: Jenna DaftJoan M Aguinaga  Procedure(s) Performed: * No procedures listed *  Anesthesia type:General  Patient location: PACU  Post pain: Pain level controlled  Post assessment: Post-op Vital signs reviewed, Patient's Cardiovascular Status Stable, Respiratory Function Stable, Patent Airway and No signs of Nausea or vomiting  Post vital signs: Reviewed and stable  Last Vitals:  Filed Vitals:   09/21/14 1239  BP: 106/36  Pulse: 76  Temp: 37.2 C  Resp: 27    Level of consciousness: awake, alert  and patient cooperative  Complications: No apparent anesthesia complications

## 2014-09-21 NOTE — Progress Notes (Signed)
Recreation Therapy Notes  Date: 06.27.16 Time: 3:00 pm Location: Craft Room  Group Topic: Wellness  Goal Area(s) Addresses:  Patient will identify at least one item per dimension of health. Patient will examine areas they are deficient.  Behavioral Response: Did not attend  Intervention: 6 Dimensions of Health  Activity: Patients were given a worksheet with the definitions of the 6 dimensions of health and instructed to read the worksheet. Patients were given a worksheet with the 6 dimensions on it and instructed to list at least one thing they are currently doing in each category.  Education: LRT educated patient on the 6 dimensions of health   Education Outcome: Patient did not attend group.  Clinical Observations/Feedback: Patient did not attend group.  Jacquelynn Cree, LRT/CTRS 09/21/2014 4:29 PM

## 2014-09-22 LAB — GLUCOSE, CAPILLARY
GLUCOSE-CAPILLARY: 87 mg/dL (ref 65–99)
GLUCOSE-CAPILLARY: 93 mg/dL (ref 65–99)
Glucose-Capillary: 102 mg/dL — ABNORMAL HIGH (ref 65–99)

## 2014-09-22 MED ORDER — ACETAMINOPHEN-CODEINE #3 300-30 MG PO TABS
1.0000 | ORAL_TABLET | Freq: Four times a day (QID) | ORAL | Status: DC
  Administered 2014-09-22 – 2014-09-25 (×9): 1 via ORAL
  Filled 2014-09-22 (×9): qty 1

## 2014-09-22 NOTE — Progress Notes (Signed)
D: Patient affect and mood are anxious.  Patient had frequent crying spells when interacting with staff.  Patient came into the hallway with just and unbuttoned shirt on.  Patient redirected back into room by staff.  Patient receptive to redirection.  Patient did NOT attend evening group. Patient visible on the milieu. No distress noted. A: Support and encouragement offered. Scheduled medications given to pt. Q 15 min checks continued for patient safety. R: Patient receptive. Patient remains safe on the unit.

## 2014-09-22 NOTE — [facility] Group Notes (Signed)
[facility] Group Notes:  (Nursing/MHT/Case Management/Adjunct)  Date:  09/22/2014  Time:  12:24 AM  Type of Therapy:  wrap up group  Participation Level:  Did Not Attend  Participation Quality:  N/A  Affect:  n/a  Cognitive:  N/A  Insight:  None  Engagement in Group:  None  Modes of Intervention:  n/a  Summary of Progress/Problems:  Burt EkJanice Marie Jerek Meulemans 09/22/2014, 12:24 AM

## 2014-09-22 NOTE — Progress Notes (Signed)
Patient affect and mood are anxious. Patient had frequent crying spells when interacting with staff. Pt refused a.m medications and then took them later. Pt confused and mumbles and cries often. Refused to go to MRI, states its "too uncomfortable.'  Patient did NOT attend any group. Patient visible on the milieu. No distress noted. A: Support and encouragement offered. Scheduled medications given to pt. Q 15 min checks continued for patient safety. R: Patient receptive. Patient remains safe on the unit.

## 2014-09-22 NOTE — Progress Notes (Signed)
LCSW attempted to engage with patient after ECT and she was unable to participate  in her assessment

## 2014-09-22 NOTE — Progress Notes (Signed)
Recreation Therapy Notes  Date: 06.28.16 Time: 3:00 pm Location: Craft Room  Group Topic: Problem Solving, Teamwork, Communication  Goal Area(s) Addresses:  Patient will effectively work with peer towards shared goal. Patient will identify skills used to make activity successful. Patient will identify benefit of using group skills effectively post d/c.  Behavioral Response: Did not attend  Intervention: [employer] Tower  Activity: Patients were instructed to build a free standing tower with 15 pipe cleaners. Patients were given 2 minutes to strategize. After patients had been building for approximately 5 minutes, LRT instructed patients to put their dominate hand behind their back. After approximately another 5 minutes of building, LRT instructed patients that they could not talk to each other.  Education: LRT educated patient on why communication, problem solving, and teamwork is important.   Education Outcome: Patient did not attend group.   Clinical Observations/Feedback: Patient did not attend group.  Jacquelynn CreeGreene,Shannen Vernon M, LRT/CTRS 09/22/2014 4:20 PM

## 2014-09-22 NOTE — Progress Notes (Signed)
[facility] MD Progress Note  09/22/2014 4:25 PM Jenna Dudley  MRN:  [id number] Subjective: Patient today was a little more communicative. She was able to slow down and answer some direct questions which was an improvement over yesterday. Still made little eye contact and had clear psychotic levels of guilt and pain.  Urinalysis from yesterday appears clean.  Patient appears to be weak and a little slowed down. Still very psychotic and depressed. I'm concerned about whether she may be having ongoing pain. Principal Problem: Severe recurrent major depression with psychotic features Diagnosis:   Patient Active Problem List   Diagnosis Date Noted  . Infection of urinary tract [N39.0] 09/17/2014  . Essential hypertension [I10] 09/17/2014  . Chronic back pain [M54.9, G89.29] 09/17/2014  . Gastric reflux [K21.9] 09/17/2014  . History of stroke [Z86.73] 09/17/2014  . Hypertension [I10] 09/16/2014  . Severe recurrent major depression with psychotic features [F33.3]    Total Time spent with patient: 30 minutes   Past Medical History:  Past Medical History  Diagnosis Date  . Hypertension   . Hyperlipemia 07/29/14  . Chronic low back pain 07/29/14  . Spinal stenosis 07/29/14  . GERD (gastroesophageal reflux disease)   . Depression   . Esophageal spasm 07/29/14  . Bilateral carpal tunnel syndrome 07/27/14  . Degenerative arthritis of lumbar spine 07/27/14    Past Surgical History  Procedure Laterality Date  . Hip surgery Right   . Replacement total knee bilateral Bilateral 07/27/14  . Inner ear surgery Right    Family History:  Family History  Problem Relation Age of Onset  . Family history unknown: Yes   Social History:  History  Alcohol Use No     History  Drug Use No    History   Social History  . Marital Status: Single    Spouse Name: N/A  . Number of Children: N/A  . Years of Education: N/A   Social History Main Topics  . Smoking status: Former Smoker    Types: Cigarettes    Quit  date: 07/29/1975  . Smokeless tobacco: Not on file  . Alcohol Use: No  . Drug Use: No  . Sexual Activity: No     Comment: postmenopause   Other Topics Concern  . None   Social History Narrative   Additional History:    Sleep: Fair  Appetite:  Fair   Assessment:   Musculoskeletal: Strength & Muscle Tone: decreased Gait & Station: unsteady Patient leans: N/A   Psychiatric Specialty Exam: Physical Exam  Constitutional: She appears well-developed and well-nourished. She appears lethargic. She has a sickly appearance.  HENT:  Head: Normocephalic and atraumatic.  Eyes: Conjunctivae are normal. Pupils are equal, round, and reactive to light.  Neck: Normal range of motion.  Cardiovascular: Normal heart sounds.   Respiratory: Effort normal.  GI: Soft.  Musculoskeletal: Normal range of motion.  Neurological: She appears lethargic.  Skin: Skin is warm and dry.  Psychiatric: Her mood appears anxious. Her speech is delayed. She is withdrawn. Thought content is delusional. Cognition and memory are impaired. She expresses inappropriate judgment. She exhibits a depressed mood. She exhibits abnormal recent memory.  Patient is withdrawn and in bed this morning. A little more communicative    Review of Systems  Constitutional: Negative.   HENT: Negative.   Eyes: Negative.   Respiratory: Negative.   Cardiovascular: Negative.   Gastrointestinal: Negative.   Musculoskeletal: Negative.   Skin: Negative.   Neurological: Negative.   Psychiatric/Behavioral: Positive for  depression. Negative for suicidal ideas and substance abuse. The patient is nervous/anxious and has insomnia.     Blood pressure 123/47, pulse 70, temperature 98.2 F (36.8 C), temperature source Oral, resp. rate 20, height '5\' 1"'  (1.549 m), weight 68.947 kg (152 lb), SpO2 99 %.Body mass index is 28.74 kg/(m^2).  General Appearance: Fairly Groomed  Engineer, water::  Fair  Speech:  Blocked and Slurred  Volume:  Decreased   Mood:  Depressed  Affect:  Depressed  Thought Process:  Tangential  Orientation:  Negative  Thought Content:  Delusions  Suicidal Thoughts:  No  Homicidal Thoughts:  No  Memory:  Immediate;   Fair Recent;   Poor Remote;   Fair  Judgement:  Impaired  Insight:  Lacking  Psychomotor Activity:  Decreased  Concentration:  Poor  Recall:  Poor  Fund of Knowledge:Fair  Language: Fair  Akathisia:  No  Handed:  Right  AIMS (if indicated):     Assets:  Financial Resources/Insurance Housing Social Support  ADL's:  Impaired  Cognition: Impaired,  Moderate  Sleep:  Number of Hours: 6     Current Medications: Current Facility-Administered Medications  Medication Dose Route Frequency Provider Last Rate Last Dose  . acetaminophen (TYLENOL) tablet 650 mg  650 mg Oral Q6H PRN Gonzella Lex, MD   650 mg at 09/17/14 1045  . acetaminophen-codeine (TYLENOL #3) 300-30 MG per tablet 1 tablet  1 tablet Oral Q8H PRN Gonzella Lex, MD      . alum & mag hydroxide-simeth (MAALOX/MYLANTA) 200-200-20 MG/5ML suspension 30 mL  30 mL Oral Q4H PRN Gonzella Lex, MD      . cephALEXin (KEFLEX) capsule 500 mg  500 mg Oral 3 times per day Gonzella Lex, MD   500 mg at 09/22/14 1506  . dextrose 5 % solution 250 mL  250 mL Intravenous Once Gonzella Lex, MD      . feeding supplement (ENSURE ENLIVE) (ENSURE ENLIVE) liquid 237 mL  237 mL Oral BID BM Marjie Skiff, MD   237 mL at 09/22/14 1508  . hydrochlorothiazide (MICROZIDE) capsule 12.5 mg  12.5 mg Oral Daily Gonzella Lex, MD   12.5 mg at 09/21/14 4008  . hydrOXYzine (ATARAX/VISTARIL) tablet 50 mg  50 mg Oral TID PRN Marjie Skiff, MD   50 mg at 09/19/14 1553  . losartan (COZAAR) tablet 50 mg  50 mg Oral Daily Gonzella Lex, MD   50 mg at 09/21/14 0701  . magnesium hydroxide (MILK OF MAGNESIA) suspension 30 mL  30 mL Oral Daily PRN Gonzella Lex, MD      . methohexital Sodium 60 mg  60 mg Intravenous Once Gonzella Lex, MD      . mirtazapine  (REMERON) tablet 45 mg  45 mg Oral QHS Gonzella Lex, MD   45 mg at 09/21/14 2133  . OLANZapine (ZYPREXA) tablet 20 mg  20 mg Oral QHS Gonzella Lex, MD   20 mg at 09/21/14 2133  . oxyCODONE-acetaminophen (PERCOCET/ROXICET) 5-325 MG per tablet 1 tablet  1 tablet Oral Q4H PRN Gonzella Lex, MD       And  . oxyCODONE (Oxy IR/ROXICODONE) immediate release tablet 5 mg  5 mg Oral Q4H PRN Gonzella Lex, MD      . pantoprazole (PROTONIX) EC tablet 40 mg  40 mg Oral BID Gonzella Lex, MD   40 mg at 09/21/14 2133  . potassium chloride SA (K-DUR,KLOR-CON) CR tablet  20 mEq  20 mEq Oral Daily Gonzella Lex, MD   20 mEq at 09/22/14 1008  . QUEtiapine (SEROQUEL) tablet 300 mg  300 mg Oral QHS Gonzella Lex, MD   300 mg at 09/21/14 2133  . sertraline (ZOLOFT) tablet 200 mg  200 mg Oral Daily Gonzella Lex, MD   200 mg at 09/22/14 1008  . simvastatin (ZOCOR) tablet 40 mg  40 mg Oral q1800 Gonzella Lex, MD   40 mg at 09/21/14 1701  . succinylcholine (ANECTINE) injection 80 mg  80 mg Intravenous Once Gonzella Lex, MD        Lab Results:  Results for orders placed or performed during the hospital encounter of 09/17/14 (from the past 48 hour(s))  Urinalysis complete, with microscopic ([facility] only)     Status: Abnormal   Collection Time: 09/21/14 12:21 PM  Result Value Ref Range   Color, Urine YELLOW (A) YELLOW   APPearance CLEAR (A) CLEAR   Glucose, UA 150 (A) NEGATIVE mg/dL   Bilirubin Urine NEGATIVE NEGATIVE   Ketones, ur TRACE (A) NEGATIVE mg/dL   Specific Gravity, Urine 1.014 1.005 - 1.030   Hgb urine dipstick NEGATIVE NEGATIVE   pH 7.0 5.0 - 8.0   Protein, ur NEGATIVE NEGATIVE mg/dL   Nitrite NEGATIVE NEGATIVE   Leukocytes, UA NEGATIVE NEGATIVE   RBC / HPF 0-5 0 - 5 RBC/hpf   WBC, UA 0-5 0 - 5 WBC/hpf   Bacteria, UA NONE SEEN NONE SEEN   Squamous Epithelial / LPF NONE SEEN NONE SEEN   Mucous PRESENT    Hyaline Casts, UA PRESENT   CBC with Differential/Platelet     Status: Abnormal    Collection Time: 09/21/14 12:43 PM  Result Value Ref Range   WBC 6.0 3.6 - 11.0 K/uL   RBC 4.24 3.80 - 5.20 MIL/uL   Hemoglobin 12.1 12.0 - 16.0 g/dL   HCT 36.9 35.0 - 47.0 %   MCV 87.2 80.0 - 100.0 fL   MCH 28.7 26.0 - 34.0 pg   MCHC 32.9 32.0 - 36.0 g/dL   RDW 14.0 11.5 - 14.5 %   Platelets 222 150 - 440 K/uL   Neutrophils Relative % 77 %   Neutro Abs 4.7 1.4 - 6.5 K/uL   Lymphocytes Relative 10 %   Lymphs Abs 0.6 (L) 1.0 - 3.6 K/uL   Monocytes Relative 9 %   Monocytes Absolute 0.5 0.2 - 0.9 K/uL   Eosinophils Relative 3 %   Eosinophils Absolute 0.2 0 - 0.7 K/uL   Basophils Relative 1 %   Basophils Absolute 0.0 0 - 0.1 K/uL  Basic metabolic panel     Status: Abnormal   Collection Time: 09/21/14 12:43 PM  Result Value Ref Range   Sodium 134 (L) 135 - 145 mmol/L   Potassium 3.2 (L) 3.5 - 5.1 mmol/L   Chloride 99 (L) 101 - 111 mmol/L   CO2 28 22 - 32 mmol/L   Glucose, Bld 298 (H) 65 - 99 mg/dL   BUN 19 6 - 20 mg/dL   Creatinine, Ser 0.78 0.44 - 1.00 mg/dL   Calcium 8.5 (L) 8.9 - 10.3 mg/dL   GFR calc non Af Amer >60 >60 mL/min   GFR calc Af Amer >60 >60 mL/min    Comment: (NOTE) The eGFR has been calculated using the CKD EPI equation. This calculation has not been validated in all clinical situations. eGFR's persistently <60 mL/min signify possible Chronic Kidney Disease.  Anion gap 7 5 - 15  Glucose, capillary     Status: None   Collection Time: 09/21/14  4:59 PM  Result Value Ref Range   Glucose-Capillary 99 65 - 99 mg/dL  Glucose, capillary     Status: None   Collection Time: 09/21/14  8:30 PM  Result Value Ref Range   Glucose-Capillary 96 65 - 99 mg/dL   Comment 1 Notify RN   Glucose, capillary     Status: None   Collection Time: 09/22/14  7:03 AM  Result Value Ref Range   Glucose-Capillary 87 65 - 99 mg/dL   Comment 1 Notify RN   Glucose, capillary     Status: None   Collection Time: 09/22/14 12:13 PM  Result Value Ref Range   Glucose-Capillary 93 65 -  99 mg/dL   Comment 1 Notify RN     Physical Findings: AIMS: Facial and Oral Movements Muscles of Facial Expression: None, normal Lips and Perioral Area: None, normal Jaw: None, normal Tongue: None, normal,Extremity Movements Upper (arms, wrists, hands, fingers): None, normal Lower (legs, knees, ankles, toes): None, normal, Trunk Movements Neck, shoulders, hips: None, normal, Overall Severity Severity of abnormal movements (highest score from questions above): None, normal Incapacitation due to abnormal movements: None, normal, Dental Status Current problems with teeth and/or dentures?: Yes (poor dental hygiene) Does patient usually wear dentures?:  (unable to respond)  CIWA:    COWS:     Treatment Plan Summary: Medication management we'll continue patient's current medications. Patient is slightly improved today but still very far off baseline. She remains very decompensated. Medically seems to be stable. I'm concerned about the possibility of her having pain. I will try to provide some standing pain medicine since she is not able to ask for it. Next ECT treatment scheduled for tomorrow morning. Hoping that we can possibly get her improved by the end of the week. Medical Decision Making:  Review of Psycho-Social Stressors (1), Review and summation of old records (2), Established Problem, Worsening (2), Review of Medication Regimen & Side Effects (2) and Review of New Medication or Change in Dosage (2)     John Clapacs 09/22/2014, 4:25 PM

## 2014-09-22 NOTE — Plan of Care (Signed)
Problem: Ineffective individual coping Goal: LTG: Patient will report a decrease in negative feelings Outcome: Not Progressing Pt continues to cry and mumble Goal: STG: Patient will remain free from self harm Outcome: Progressing No self harm observed or reported

## 2014-09-22 NOTE — [facility] Group Notes (Signed)
[facility] Group Notes:  (Nursing/MHT/Case Management/Adjunct)  Date:  09/22/2014  Time:  2:14 PM  Type of Therapy:  Psychoeducational Skills  Participation Level:  Did Not Attend  Lynelle SmokeCara Travis [facility] Hospital[facility] 09/22/2014, 2:14 PM

## 2014-09-23 ENCOUNTER — Other Ambulatory Visit: Payer: Self-pay | Admitting: *Deleted

## 2014-09-23 ENCOUNTER — Other Ambulatory Visit: Payer: Self-pay

## 2014-09-23 ENCOUNTER — Inpatient Hospital Stay: Payer: Medicare Other | Admitting: Anesthesiology

## 2014-09-23 ENCOUNTER — Inpatient Hospital Stay: Payer: Medicare Other

## 2014-09-23 ENCOUNTER — Encounter: Payer: Self-pay | Admitting: *Deleted

## 2014-09-23 LAB — GLUCOSE, CAPILLARY
Glucose-Capillary: 105 mg/dL — ABNORMAL HIGH (ref 65–99)
Glucose-Capillary: 140 mg/dL — ABNORMAL HIGH (ref 65–99)

## 2014-09-23 MED ORDER — SUCCINYLCHOLINE CHLORIDE 20 MG/ML IJ SOLN
80.0000 mg | Freq: Once | INTRAMUSCULAR | Status: AC
  Administered 2017-11-07: 80 mg via INTRAVENOUS

## 2014-09-23 MED ORDER — OLANZAPINE 10 MG IM SOLR
15.0000 mg | INTRAMUSCULAR | Status: AC
  Administered 2014-09-23: 15 mg via INTRAMUSCULAR
  Filled 2014-09-23: qty 20

## 2014-09-23 MED ORDER — SUCCINYLCHOLINE CHLORIDE 20 MG/ML IJ SOLN
80.0000 mg | Freq: Once | INTRAMUSCULAR | Status: AC
Start: 2014-09-23 — End: 2014-09-23
  Administered 2014-09-23: 80 mg via INTRAVENOUS

## 2014-09-23 MED ORDER — KETOROLAC TROMETHAMINE 30 MG/ML IJ SOLN: 30.0000 mg | Freq: Once | INTRAMUSCULAR | Status: AC

## 2014-09-23 MED ORDER — METHOHEXITAL SODIUM 100 MG/10ML IV SOSY
60.0000 mg | PREFILLED_SYRINGE | Freq: Once | INTRAVENOUS | Status: AC
  Administered 2014-09-23: 60 mg via INTRAVENOUS

## 2014-09-23 MED ORDER — SUCCINYLCHOLINE CHLORIDE 20 MG/ML IJ SOLN
80.0000 mg | Freq: Once | INTRAMUSCULAR | Status: AC
  Administered 2017-04-30: 80 mg via INTRAVENOUS

## 2014-09-23 MED ORDER — METHOHEXITAL SODIUM 100 MG/10ML IV SOSY
60.0000 mg | PREFILLED_SYRINGE | Freq: Once | INTRAVENOUS | Status: AC
  Administered 2017-04-30: 60 mg via INTRAVENOUS

## 2014-09-23 MED ORDER — LABETALOL HCL 5 MG/ML IV SOLN
20.0000 mg | Freq: Once | INTRAVENOUS | Status: AC
  Administered 2014-09-23: 20 mg via INTRAVENOUS

## 2014-09-23 MED ORDER — LACTATED RINGERS IV SOLN
INTRAVENOUS | Status: DC
  Administered 2017-08-01 – 2018-09-30 (×2): via INTRAVENOUS

## 2014-09-23 MED ORDER — LABETALOL HCL 5 MG/ML IV SOLN
20.0000 mg | Freq: Once | INTRAVENOUS | Status: AC
Start: 2014-09-23 — End: 2017-04-30
  Administered 2017-04-30: 20 mg via INTRAVENOUS

## 2014-09-23 MED ORDER — LACTATED RINGERS IV SOLN
INTRAVENOUS | Status: DC
Start: 2014-09-23 — End: 2014-09-23
  Administered 2014-09-23: 12:00:00 via INTRAVENOUS

## 2014-09-23 MED ORDER — LACTATED RINGERS IV SOLN
INTRAVENOUS | Status: DC | PRN
  Administered 2014-09-23: 12:00:00 via INTRAVENOUS

## 2014-09-23 MED ORDER — DEXTROSE 5 % IV SOLN
250.0000 mL | Freq: Once | INTRAVENOUS | Status: AC
Start: 2014-09-23 — End: 2014-10-07
  Administered 2014-10-07: 11:00:00 via INTRAVENOUS

## 2014-09-23 MED ORDER — LABETALOL HCL 5 MG/ML IV SOLN
20.0000 mg | Freq: Once | INTRAVENOUS | Status: AC
  Administered 2017-07-25: 20 mg via INTRAVENOUS

## 2014-09-23 MED ORDER — KETOROLAC TROMETHAMINE 30 MG/ML IJ SOLN
30.0000 mg | Freq: Once | INTRAMUSCULAR | Status: AC
  Administered 2014-09-23: 30 mg via INTRAVENOUS

## 2014-09-23 MED ORDER — METHOHEXITAL SODIUM 100 MG/10ML IV SOSY
60.0000 mg | PREFILLED_SYRINGE | Freq: Once | INTRAVENOUS | Status: AC
  Administered 2017-07-25: 60 mg via INTRAVENOUS

## 2014-09-23 NOTE — Plan of Care (Signed)
Problem: Alteration in thought process Goal: STG-Patient does not respond to command hallucinations Outcome: Not Progressing Patient is responding to internal stimuli.

## 2014-09-23 NOTE — Tx Team (Signed)
Interdisciplinary Treatment Plan Update (Adult)  Date:  09/23/2014 Time Reviewed:  6:54 PM  Progress in Treatment: Attending groups: No. Participating in groups:  No. Taking medication as prescribed:  Yes. Tolerating medication:  Yes. Family/Significant othe contact made:  No, will contact:   husband Patient understands diagnosis:  No. Discussing patient identified problems/goals with staff:  Yes. Medical problems stabilized or resolved:  No. Denies suicidal/homicidal ideation: Yes. Issues/concerns per patient self-inventory:  No. Other:  New problem(s) identified: Yes, Describe:  Patient struggles to make her wishes clear, dischelved and at times no responsive to simple questions  Discharge Plan or Barriers: patient to discharge to husband  Reason for Continuation of Hospitalization: ECT treatment  Comments:  Estimated length of stay:3-7 days  New goal(s):  Review of initial/current patient goals per problem list:   Refer to plan of care  Attendees: Patient:  Jenna Dudley June 28/16 1pm  Family:     Physician:  Dr Toni Amendlapacs June 28/16 1pm  [facility]   [facility] June 28/16 1pm  Case Manager:  Arrie Senatelaudine Bristol Soy LCSW June 28/16 1pm  Counselor:     Other:     Other:     Other:     Other:    Other:    Other:    Other:    Other:    Other:    Other:      Scribe for Treatment Team:   Cheron SchaumannBandi, Jakala Herford M, 09/23/2014, 6:54 PM

## 2014-09-23 NOTE — Progress Notes (Signed)
D: Pt denies SI/HI, patient's affect is flat and sad, isolates to the room and not interacting with peers. Mood is irritable with periods of crying spells.Patient is confused at times to place time and situation, speech is incoherent, and thoughts are disorganized, appears to be responding to internal stimuli, unable to follow simple command, frequent redirection used for safety. Patient initially refused medication after much persuasion, patient agreed to take medication, on ABT/UTI no adverse reaction noted, NPO status after midnight, for ECT in am.    A: Pt was offered support and encouragement. Pt was given scheduled medications. Pt was encouraged to attend groups. Q 15 minute checks were done for safety.  R Pt is taking medication. Pt has no complaints. safety maintained on unit.

## 2014-09-23 NOTE — Progress Notes (Signed)
Patient with depressed affect and behavior is resistive to plan of care. Remains NPO status this am. Patient resistive to taking Blood glucose, Blood pressure meds and refuses transport to ECT. Encouragement and education and patient refuses above. Husband arrives and is supportive and patient remains resistive.  Dr. Toni Amendlapacs to unit and orders Zyprex 15 mg/IM and med administered. Patient also agrees to take BP meds. ECT transport arrives and patient transports for ECT treatment prior to blood glucose monitoring. No s/s of hypo/hyperglycemia at this time. Safety maintained.

## 2014-09-23 NOTE — Anesthesia Preprocedure Evaluation (Signed)
Anesthesia Evaluation   Patient awake    Reviewed: Allergy & Precautions  History of Anesthesia Complications Negative for: history of anesthetic complications  Airway Mallampati: II       Dental  (+) Upper Dentures   Pulmonary former smoker,  + rhonchi         Cardiovascular hypertension, Pt. on medications Rhythm:Regular     Neuro/Psych Depression    GI/Hepatic Neg liver ROS, GERD-  ,  Endo/Other  negative endocrine ROS  Renal/GU   negative genitourinary   Musculoskeletal  (+) Arthritis -, Osteoarthritis,    Abdominal Normal abdominal exam  (+)   Peds negative pediatric ROS (+)  Hematology negative hematology ROS (+)   Anesthesia Other Findings   Reproductive/Obstetrics                             Anesthesia Physical Anesthesia Plan  ASA: III  Anesthesia Plan: General   Post-op Pain Management:    Induction: Intravenous  Airway Management Planned: Mask  Additional Equipment:   Intra-op Plan:   Post-operative Plan:   Informed Consent: I have reviewed the patients History and Physical, chart, labs and discussed the procedure including the risks, benefits and alternatives for the proposed anesthesia with the patient or authorized representative who has indicated his/her understanding and acceptance.     Plan Discussed with: CRNA  Anesthesia Plan Comments:         Anesthesia Quick Evaluation

## 2014-09-23 NOTE — Progress Notes (Signed)
Patient refusing transport to ECT and POCT blood glucose this am. Remains NPO at this time.

## 2014-09-23 NOTE — Procedures (Signed)
ECT SERVICES Physician's Interval Evaluation & Treatment Note  Patient Identification: Jenna Dudley MRN:  [id number] Date of Evaluation:  09/23/2014 TX #: [id number]  MADRS:   MMSE:   P.E. Findings:  No specific new physical exam findings  Psychiatric Interval Note:  Patient is severely depressed withdrawn and psychotic. Unable to hold a conversation. Disoriented. Tearful and agitated.  Subjective:  Patient is a 79 y.o. female seen for evaluation for Electroconvulsive Therapy. Patient has no subjective expressed concern  Treatment Summary:   [x]   Right Unilateral             []  Bilateral   % Energy : 0.3 ms 100%   Impedance: 1010 ohms  Seizure Energy Index: 3600 V squared  Postictal Suppression Index: 64%  Seizure Concordance Index: 93%  Medications  Pre Shock: Labetalol 20 mg, Toradol 30 mg, Brevital 60 mg, succinylcholine 80 mg  Post Shock:    Seizure Duration: 19 seconds by EMG, 45 seconds by EEG   Comments: Patient remains very sick. Will be in the hospital and receiving treatment on Friday.   Lungs:  [x]   Clear to auscultation               []  Other:   Heart:    [x]   Regular rhythm             []  irregular rhythm    [x]   Previous H&P reviewed, patient examined and there are NO CHANGES                 []   Previous H&P reviewed, patient examined and there are changes noted.   Mordecai RasmussenJohn Vicky Mccanless, MD 6/29/201612:09 PM

## 2014-09-23 NOTE — [facility] Counselor (Signed)
Adult Comprehensive Assessment  Patient ID: Jenna Dudley, female   DOB: 18-Mar-1936, 79 y.o.   MRN: [id number]  Information Source: Information source: Patient  Current Stressors:     Living/Environment/Situation:  Living Arrangements: Spouse/significant other What is atmosphere in current home: Comfortable  Family History:  Marital status: Married  Childhood History:  Does patient have siblings?: Yes  Education:  Highest grade of school patient has completed: Unknown Name of school: n/a Learning disability?: No  Employment/Work Situation:      Pensions consultant:      Alcohol/Substance Abuse:   What has been your use of drugs/alcohol within the last 12 months?: no If attempted suicide, did drugs/alcohol play a role in this?: No Alcohol/Substance Abuse Treatment Hx: Denies past history Has alcohol/substance abuse ever caused legal problems?: No  Social Support System:   Pensions consultant Support System: Good Describe Community Support System: unknown Type of faith/religion: god How does patient's faith help to cope with current illness?: unable to answer  Leisure/Recreation:   Leisure and Hobbies: unable to answer  Strengths/Needs:   What things does the patient do well?: unable to answer In what areas does patient struggle / problems for patient: unable to answer  Discharge Plan:   Does patient have access to transportation?: Yes Will patient be returning to same living situation after discharge?: Yes Currently receiving community mental health services: Yes (From Whom) ([place]) Does patient have financial barriers related to discharge medications?: No  Summary/Recommendations:   Summary and Recommendations (to be completed by the evaluator): Met with patient over the course of 2 days. She was unable to answer almost all simple questions. In discussion with Dr Weber Cooks and patient we were able to find out some information. Patient is  currently receiving ECT treatment but has not attending groups even with encouragement. In attempts to collect data for this assessment patient was weeping and requested to stop. LCSW will report this to staff and coverage LCSW.  Jenna Dudley M. 09/23/2014

## 2014-09-23 NOTE — Progress Notes (Signed)
[facility] MD Progress Note  09/23/2014 7:18 PM Jenna Dudley  MRN:  [id number] Subjective: Patient was significantly worse prior to ECT this morning. She was unable to communicate in any kind of reasonable manner. Made no eye contact. She was weeping and muttering to herself. Nothing that she said made sense. She was refusing to cooperate with ECT but her presentation demonstrated that there was no logic or capacity to it. I had her given IM Zyprexa and her husband came to visit with her. Eventually she was convinced to come to ECT. After treatment immediately she was a great deal better. Affect was calm and she was able to make good eye contact and communicate lucidly. A little bit later she was back to feeling more anxious. Nevertheless this I think demonstrates the potential improvement the continuing ECT should provide Urinalysis from yesterday appears clean.  Patient appears to be weak and a little slowed down. Still very psychotic and depressed. I'm concerned about whether she may be having ongoing pain. Principal Problem: Severe recurrent major depression with psychotic features Diagnosis:   Patient Active Problem List   Diagnosis Date Noted  . Infection of urinary tract [N39.0] 09/17/2014  . Essential hypertension [I10] 09/17/2014  . Chronic back pain [M54.9, G89.29] 09/17/2014  . Gastric reflux [K21.9] 09/17/2014  . History of stroke [Z86.73] 09/17/2014  . Hypertension [I10] 09/16/2014  . Severe recurrent major depression with psychotic features [F33.3]    Total Time spent with patient: 30 minutes   Past Medical History:  Past Medical History  Diagnosis Date  . Hypertension   . Hyperlipemia 07/29/14  . Chronic low back pain 07/29/14  . Spinal stenosis 07/29/14  . GERD (gastroesophageal reflux disease)   . Depression   . Esophageal spasm 07/29/14  . Bilateral carpal tunnel syndrome 07/27/14  . Degenerative arthritis of lumbar spine 07/27/14    Past Surgical History  Procedure Laterality  Date  . Hip surgery Right   . Replacement total knee bilateral Bilateral 07/27/14  . Inner ear surgery Right    Family History:  Family History  Problem Relation Age of Onset  . Family history unknown: Yes   Social History:  History  Alcohol Use No     History  Drug Use No    History   Social History  . Marital Status: Single    Spouse Name: N/A  . Number of Children: N/A  . Years of Education: N/A   Social History Main Topics  . Smoking status: Former Smoker    Types: Cigarettes    Quit date: 07/29/1975  . Smokeless tobacco: Not on file  . Alcohol Use: No  . Drug Use: No  . Sexual Activity: No     Comment: postmenopause   Other Topics Concern  . None   Social History Narrative   Additional History:    Sleep: Fair  Appetite:  Fair   Assessment:   Musculoskeletal: Strength & Muscle Tone: decreased Gait & Station: unsteady Patient leans: N/A   Psychiatric Specialty Exam: Physical Exam  Constitutional: She appears well-developed and well-nourished. She appears lethargic. She has a sickly appearance.  HENT:  Head: Normocephalic and atraumatic.  Eyes: Conjunctivae are normal. Pupils are equal, round, and reactive to light.  Neck: Normal range of motion.  Cardiovascular: Normal heart sounds.   Respiratory: Effort normal.  GI: Soft.  Musculoskeletal: Normal range of motion.  Neurological: She appears lethargic.  Skin: Skin is warm and dry.  Psychiatric: Her mood appears anxious. Her  speech is delayed. She is withdrawn. Thought content is delusional. Cognition and memory are impaired. She expresses inappropriate judgment. She exhibits a depressed mood. She exhibits abnormal recent memory.  Patient is withdrawn and in bed this morning. A little more communicative    Review of Systems  Constitutional: Negative.   HENT: Negative.   Eyes: Negative.   Respiratory: Negative.   Cardiovascular: Negative.   Gastrointestinal: Negative.   Musculoskeletal:  Negative.   Skin: Negative.   Neurological: Negative.   Psychiatric/Behavioral: Positive for depression. Negative for suicidal ideas and substance abuse. The patient is nervous/anxious and has insomnia.     Blood pressure 123/70, pulse 73, temperature 97.8 F (36.6 C), temperature source Oral, resp. rate 20, height 5\' 1"  (1.549 m), weight 68.947 kg (152 lb), SpO2 100 %.Body mass index is 28.74 kg/(m^2).  General Appearance: Fairly Groomed  Patent attorney::  Fair  Speech:  Blocked and Slurred  Volume:  Decreased  Mood:  Depressed  Affect:  Depressed  Thought Process:  Tangential  Orientation:  Negative  Thought Content:  Delusions  Suicidal Thoughts:  No  Homicidal Thoughts:  No  Memory:  Immediate;   Fair Recent;   Poor Remote;   Fair  Judgement:  Impaired  Insight:  Lacking  Psychomotor Activity:  Decreased  Concentration:  Poor  Recall:  Poor  Fund of Knowledge:Fair  Language: Fair  Akathisia:  No  Handed:  Right  AIMS (if indicated):     Assets:  Financial Resources/Insurance Housing Social Support  ADL's:  Impaired  Cognition: Impaired,  Moderate  Sleep:  Number of Hours: 5     Current Medications: Current Facility-Administered Medications  Medication Dose Route Frequency Provider Last Rate Last Dose  . acetaminophen-codeine (TYLENOL #3) 300-30 MG per tablet 1 tablet  1 tablet Oral Q6H Audery Amel, MD   1 tablet at 09/23/14 1401  . alum & mag hydroxide-simeth (MAALOX/MYLANTA) 200-200-20 MG/5ML suspension 30 mL  30 mL Oral Q4H PRN Audery Amel, MD      . cephALEXin (KEFLEX) capsule 500 mg  500 mg Oral 3 times per day Audery Amel, MD   500 mg at 09/23/14 0702  . feeding supplement (ENSURE ENLIVE) (ENSURE ENLIVE) liquid 237 mL  237 mL Oral BID BM Kerin Salen, MD   237 mL at 09/23/14 1330  . hydrochlorothiazide (MICROZIDE) capsule 12.5 mg  12.5 mg Oral Daily Audery Amel, MD   12.5 mg at 09/23/14 1102  . hydrOXYzine (ATARAX/VISTARIL) tablet 50 mg  50 mg  Oral TID PRN Kerin Salen, MD   50 mg at 09/19/14 1553  . losartan (COZAAR) tablet 50 mg  50 mg Oral Daily Audery Amel, MD   50 mg at 09/23/14 0704  . magnesium hydroxide (MILK OF MAGNESIA) suspension 30 mL  30 mL Oral Daily PRN Audery Amel, MD      . mirtazapine (REMERON) tablet 45 mg  45 mg Oral QHS Audery Amel, MD   45 mg at 09/22/14 2201  . OLANZapine (ZYPREXA) tablet 20 mg  20 mg Oral QHS Audery Amel, MD   20 mg at 09/22/14 2200  . oxyCODONE-acetaminophen (PERCOCET/ROXICET) 5-325 MG per tablet 1 tablet  1 tablet Oral Q4H PRN Audery Amel, MD       And  . oxyCODONE (Oxy IR/ROXICODONE) immediate release tablet 5 mg  5 mg Oral Q4H PRN Audery Amel, MD      . pantoprazole (PROTONIX) EC tablet  40 mg  40 mg Oral BID Audery AmelJohn T Clapacs, MD   40 mg at 09/23/14 1102  . potassium chloride SA (K-DUR,KLOR-CON) CR tablet 20 mEq  20 mEq Oral Daily Audery AmelJohn T Clapacs, MD   20 mEq at 09/23/14 1333  . QUEtiapine (SEROQUEL) tablet 300 mg  300 mg Oral QHS Audery AmelJohn T Clapacs, MD   300 mg at 09/22/14 2201  . sertraline (ZOLOFT) tablet 200 mg  200 mg Oral Daily Audery AmelJohn T Clapacs, MD   200 mg at 09/23/14 1332  . simvastatin (ZOCOR) tablet 40 mg  40 mg Oral q1800 Audery AmelJohn T Clapacs, MD   40 mg at 09/23/14 1639   Facility-Administered Medications Ordered in Other Encounters  Medication Dose Route Frequency Provider Last Rate Last Dose  . dextrose 5 % solution 250 mL  250 mL Intravenous Once Audery AmelJohn T Clapacs, MD      . ketorolac (TORADOL) 30 MG/ML injection 30 mg  30 mg Intravenous Once Audery AmelJohn T Clapacs, MD      . ketorolac (TORADOL) 30 MG/ML injection 30 mg  30 mg Intravenous Once Audery AmelJohn T Clapacs, MD      . labetalol (NORMODYNE,TRANDATE) injection 20 mg  20 mg Intravenous Once Audery AmelJohn T Clapacs, MD      . labetalol (NORMODYNE,TRANDATE) injection 20 mg  20 mg Intravenous Once Audery AmelJohn T Clapacs, MD      . lactated ringers infusion   Intravenous Continuous Audery AmelJohn T Clapacs, MD      . methohexital Sodium 60 mg  60 mg Intravenous  Once Audery AmelJohn T Clapacs, MD      . methohexital Sodium 60 mg  60 mg Intravenous Once Audery AmelJohn T Clapacs, MD      . succinylcholine (ANECTINE) injection 80 mg  80 mg Intravenous Once Audery AmelJohn T Clapacs, MD      . succinylcholine (ANECTINE) injection 80 mg  80 mg Intravenous Once Audery AmelJohn T Clapacs, MD        Lab Results:  Results for orders placed or performed during the hospital encounter of 09/17/14 (from the past 48 hour(s))  Glucose, capillary     Status: None   Collection Time: 09/21/14  8:30 PM  Result Value Ref Range   Glucose-Capillary 96 65 - 99 mg/dL   Comment 1 Notify RN   Glucose, capillary     Status: None   Collection Time: 09/22/14  7:03 AM  Result Value Ref Range   Glucose-Capillary 87 65 - 99 mg/dL   Comment 1 Notify RN   Glucose, capillary     Status: None   Collection Time: 09/22/14 12:13 PM  Result Value Ref Range   Glucose-Capillary 93 65 - 99 mg/dL   Comment 1 Notify RN   Glucose, capillary     Status: Abnormal   Collection Time: 09/22/14  4:57 PM  Result Value Ref Range   Glucose-Capillary 102 (H) 65 - 99 mg/dL  Glucose, capillary     Status: Abnormal   Collection Time: 09/23/14  1:18 PM  Result Value Ref Range   Glucose-Capillary 105 (H) 65 - 99 mg/dL   Comment 1 Notify RN     Physical Findings: AIMS: Facial and Oral Movements Muscles of Facial Expression: None, normal Lips and Perioral Area: None, normal Jaw: None, normal Tongue: None, normal,Extremity Movements Upper (arms, wrists, hands, fingers): None, normal Lower (legs, knees, ankles, toes): None, normal, Trunk Movements Neck, shoulders, hips: None, normal, Overall Severity Severity of abnormal movements (highest score from questions above): None, normal Incapacitation due to  abnormal movements: None, normal, Dental Status Current problems with teeth and/or dentures?: Yes (poor dental hygiene) Does patient usually wear dentures?:  (unable to respond)  CIWA:    COWS:     Treatment Plan  Summary: Medication management we'll continue patient's current medications. Continue current medication. Another ECT Will Take Pl., Friday. Unfortunately I will be out next week so I am hoping that we can see some lasting improvement after the treatment on Friday. I am keeping her husband up-to-date about the situation Medical Decision Making:  Review of Psycho-Social Stressors (1), Review and summation of old records (2), Established Problem, Worsening (2), Review of Medication Regimen & Side Effects (2) and Review of New Medication or Change in Dosage (2)     John Clapacs 09/23/2014, 7:18 PM

## 2014-09-23 NOTE — Transfer of Care (Signed)
Immediate Anesthesia Transfer of Care Note  Patient: Jenna Dudley  Procedure(s) Performed: ECT  Patient Location: PACU  Anesthesia Type:General  Level of Consciousness: sedated  Airway & Oxygen Therapy: Patient connected to face mask oxygen  Post-op Assessment: Report given to RN  Post vital signs: Reviewed and stable  Last Vitals:  Filed Vitals:   09/23/14 1117  BP: 149/53  Pulse: 78  Temp: 36.7 C  Resp: 18    Complications: No apparent anesthesia complications

## 2014-09-23 NOTE — Plan of Care (Signed)
Problem: Alteration in thought process Goal: STG-Patient is able to follow short directions Outcome: Progressing Patient is alert & oriented x 1-2 with periods of confusion to situation, place and time, patient is redirectable

## 2014-09-23 NOTE — Plan of Care (Signed)
Problem: [facility] Concurrent Medical Problem Goal: STG-Vital signs will be within defined limits or stabilized (STG- Vital signs will be within defined limits or stabilized for individual)  Outcome: Progressing Patient received ECT today with vital signs monitored and recorded and pain symptoms managed with good effect.

## 2014-09-24 ENCOUNTER — Inpatient Hospital Stay: Payer: Medicare Other

## 2014-09-24 ENCOUNTER — Other Ambulatory Visit: Payer: Self-pay | Admitting: *Deleted

## 2014-09-24 LAB — GLUCOSE, CAPILLARY
GLUCOSE-CAPILLARY: 133 mg/dL — AB (ref 65–99)
Glucose-Capillary: 90 mg/dL (ref 65–99)

## 2014-09-24 NOTE — Progress Notes (Signed)
[facility] MD Progress Note  09/24/2014 5:58 PM Jenna Dudley  MRN:  [id number] Subjective: Patient was much improved after ECT yesterday and continues to be improved today. Not back to baseline. She is still withdrawn and makes only a little bit of eye contact. She rambles on about delusions in a confused manner but she is also able to answer some questions directly and shows a little bit of returning logic. She is taking her medicine well. Shows better insight.  Vital signs stable. No new physical complaints   Principal Problem: Severe recurrent major depression with psychotic features Diagnosis:   Patient Active Problem List   Diagnosis Date Noted  . Infection of urinary tract [N39.0] 09/17/2014  . Essential hypertension [I10] 09/17/2014  . Chronic back pain [M54.9, G89.29] 09/17/2014  . Gastric reflux [K21.9] 09/17/2014  . History of stroke [Z86.73] 09/17/2014  . Hypertension [I10] 09/16/2014  . Severe recurrent major depression with psychotic features [F33.3]    Total Time spent with patient: 30 minutes   Past Medical History:  Past Medical History  Diagnosis Date  . Hypertension   . Hyperlipemia 07/29/14  . Chronic low back pain 07/29/14  . Spinal stenosis 07/29/14  . GERD (gastroesophageal reflux disease)   . Depression   . Esophageal spasm 07/29/14  . Bilateral carpal tunnel syndrome 07/27/14  . Degenerative arthritis of lumbar spine 07/27/14    Past Surgical History  Procedure Laterality Date  . Hip surgery Right   . Replacement total knee bilateral Bilateral 07/27/14  . Inner ear surgery Right    Family History:  Family History  Problem Relation Age of Onset  . Family history unknown: Yes   Social History:  History  Alcohol Use No     History  Drug Use No    History   Social History  . Marital Status: Single    Spouse Name: N/A  . Number of Children: N/A  . Years of Education: N/A   Social History Main Topics  . Smoking status: Former Smoker    Types: Cigarettes     Quit date: 07/29/1975  . Smokeless tobacco: Not on file  . Alcohol Use: No  . Drug Use: No  . Sexual Activity: No     Comment: postmenopause   Other Topics Concern  . None   Social History Narrative   Additional History:    Sleep: Fair  Appetite:  Fair   Assessment:   Musculoskeletal: Strength & Muscle Tone: decreased Gait & Station: unsteady Patient leans: N/A   Psychiatric Specialty Exam: Physical Exam  Constitutional: She appears well-developed and well-nourished. She appears lethargic. She has a sickly appearance.  HENT:  Head: Normocephalic and atraumatic.  Eyes: Conjunctivae are normal. Pupils are equal, round, and reactive to light.  Neck: Normal range of motion.  Cardiovascular: Normal heart sounds.   Respiratory: Effort normal.  GI: Soft.  Musculoskeletal: Normal range of motion.  Neurological: She appears lethargic.  Skin: Skin is warm and dry.  Psychiatric: Her mood appears anxious. Her speech is delayed. She is withdrawn. Thought content is delusional. Cognition and memory are impaired. She expresses inappropriate judgment. She exhibits a depressed mood. She exhibits abnormal recent memory.  Although she is still very impaired, she is markedly better than she was yesterday. She is able to answer questions fairly coherently. She still rambles on delusionally but when interrupted is able to engage in some conversation. She is up out of bed and is not argumentative. More compliant with medication.  Review of Systems  Constitutional: Negative.   HENT: Negative.   Eyes: Negative.   Respiratory: Negative.   Cardiovascular: Negative.   Gastrointestinal: Negative.   Musculoskeletal: Negative.   Skin: Negative.   Neurological: Negative.   Psychiatric/Behavioral: Positive for depression. Negative for suicidal ideas and substance abuse. The patient is nervous/anxious and has insomnia.     Blood pressure 146/76, pulse 99, temperature 97.5 F (36.4 C),  temperature source Oral, resp. rate 20, height 5\' 1"  (1.549 m), weight 68.947 kg (152 lb), SpO2 100 %.Body mass index is 28.74 kg/(m^2).  General Appearance: Fairly Groomed  Patent attorneyye Contact::  Fair  Speech:  Blocked and Slurred  Volume:  Decreased  Mood:  Depressed  Affect:  Depressed  Thought Process:  Tangential  Orientation:  Negative  Thought Content:  Delusions  Suicidal Thoughts:  No  Homicidal Thoughts:  No  Memory:  Immediate;   Fair Recent;   Poor Remote;   Fair  Judgement:  Impaired  Insight:  Lacking  Psychomotor Activity:  Decreased  Concentration:  Poor  Recall:  Poor  Fund of Knowledge:Fair  Language: Fair  Akathisia:  No  Handed:  Right  AIMS (if indicated):     Assets:  Financial Resources/Insurance Housing Social Support  ADL's:  Impaired  Cognition: Impaired,  Moderate  Sleep:  Number of Hours: 6.25     Current Medications: Current Facility-Administered Medications  Medication Dose Route Frequency Provider Last Rate Last Dose  . acetaminophen-codeine (TYLENOL #3) 300-30 MG per tablet 1 tablet  1 tablet Oral Q6H Audery AmelJohn T Khayla Koppenhaver, MD   1 tablet at 09/24/14 1733  . alum & mag hydroxide-simeth (MAALOX/MYLANTA) 200-200-20 MG/5ML suspension 30 mL  30 mL Oral Q4H PRN Audery AmelJohn T Ashleyanne Hemmingway, MD      . cephALEXin (KEFLEX) capsule 500 mg  500 mg Oral 3 times per day Audery AmelJohn T Ciana Simmon, MD   500 mg at 09/24/14 1543  . feeding supplement (ENSURE ENLIVE) (ENSURE ENLIVE) liquid 237 mL  237 mL Oral BID BM Kerin SalenAlton L Williams, MD   237 mL at 09/24/14 1400  . hydrochlorothiazide (MICROZIDE) capsule 12.5 mg  12.5 mg Oral Daily Audery AmelJohn T Brandii Lakey, MD   12.5 mg at 09/24/14 0916  . hydrOXYzine (ATARAX/VISTARIL) tablet 50 mg  50 mg Oral TID PRN Kerin SalenAlton L Williams, MD   50 mg at 09/19/14 1553  . losartan (COZAAR) tablet 50 mg  50 mg Oral Daily Audery AmelJohn T Agam Davenport, MD   50 mg at 09/24/14 0916  . magnesium hydroxide (MILK OF MAGNESIA) suspension 30 mL  30 mL Oral Daily PRN Audery AmelJohn T Najir Roop, MD      . mirtazapine  (REMERON) tablet 45 mg  45 mg Oral QHS Audery AmelJohn T Sid Greener, MD   45 mg at 09/23/14 2133  . OLANZapine (ZYPREXA) tablet 20 mg  20 mg Oral QHS Audery AmelJohn T Maekayla Giorgio, MD   20 mg at 09/23/14 2133  . oxyCODONE-acetaminophen (PERCOCET/ROXICET) 5-325 MG per tablet 1 tablet  1 tablet Oral Q4H PRN Audery AmelJohn T Korra Christine, MD       And  . oxyCODONE (Oxy IR/ROXICODONE) immediate release tablet 5 mg  5 mg Oral Q4H PRN Audery AmelJohn T Chelesa Weingartner, MD      . pantoprazole (PROTONIX) EC tablet 40 mg  40 mg Oral BID Audery AmelJohn T Nieshia Larmon, MD   40 mg at 09/24/14 0916  . potassium chloride SA (K-DUR,KLOR-CON) CR tablet 20 mEq  20 mEq Oral Daily Audery AmelJohn T Hashir Deleeuw, MD   20 mEq at 09/24/14 0916  .  QUEtiapine (SEROQUEL) tablet 300 mg  300 mg Oral QHS Audery Amel, MD   300 mg at 09/23/14 2132  . sertraline (ZOLOFT) tablet 200 mg  200 mg Oral Daily Audery Amel, MD   200 mg at 09/24/14 0916  . simvastatin (ZOCOR) tablet 40 mg  40 mg Oral q1800 Audery Amel, MD   40 mg at 09/24/14 1734   Facility-Administered Medications Ordered in Other Encounters  Medication Dose Route Frequency Provider Last Rate Last Dose  . dextrose 5 % solution 250 mL  250 mL Intravenous Once Audery Amel, MD      . ketorolac (TORADOL) 30 MG/ML injection 30 mg  30 mg Intravenous Once Audery Amel, MD      . ketorolac (TORADOL) 30 MG/ML injection 30 mg  30 mg Intravenous Once Audery Amel, MD      . labetalol (NORMODYNE,TRANDATE) injection 20 mg  20 mg Intravenous Once Audery Amel, MD      . labetalol (NORMODYNE,TRANDATE) injection 20 mg  20 mg Intravenous Once Audery Amel, MD      . lactated ringers infusion   Intravenous Continuous Audery Amel, MD      . methohexital Sodium 60 mg  60 mg Intravenous Once Audery Amel, MD      . methohexital Sodium 60 mg  60 mg Intravenous Once Audery Amel, MD      . succinylcholine (ANECTINE) injection 80 mg  80 mg Intravenous Once Audery Amel, MD      . succinylcholine (ANECTINE) injection 80 mg  80 mg Intravenous Once Audery Amel, MD        Lab Results:  Results for orders placed or performed during the hospital encounter of 09/17/14 (from the past 48 hour(s))  Glucose, capillary     Status: Abnormal   Collection Time: 09/23/14  1:18 PM  Result Value Ref Range   Glucose-Capillary 105 (H) 65 - 99 mg/dL   Comment 1 Notify RN   Glucose, capillary     Status: Abnormal   Collection Time: 09/23/14  8:22 PM  Result Value Ref Range   Glucose-Capillary 140 (H) 65 - 99 mg/dL  Glucose, capillary     Status: Abnormal   Collection Time: 09/24/14 12:10 PM  Result Value Ref Range   Glucose-Capillary 133 (H) 65 - 99 mg/dL  Glucose, capillary     Status: None   Collection Time: 09/24/14  4:38 PM  Result Value Ref Range   Glucose-Capillary 90 65 - 99 mg/dL    Physical Findings: AIMS: Facial and Oral Movements Muscles of Facial Expression: None, normal Lips and Perioral Area: None, normal Jaw: None, normal Tongue: None, normal,Extremity Movements Upper (arms, wrists, hands, fingers): None, normal Lower (legs, knees, ankles, toes): None, normal, Trunk Movements Neck, shoulders, hips: None, normal, Overall Severity Severity of abnormal movements (highest score from questions above): None, normal Incapacitation due to abnormal movements: None, normal, Dental Status Current problems with teeth and/or dentures?: Yes (poor dental hygiene) Does patient usually wear dentures?:  (unable to respond)  CIWA:    COWS:     Treatment Plan Summary: Medication management we'll continue patient's current medications. C patient is scheduled for ECT tomorrow. Continue current medications as prescribed now. Continue the standing pain medicines since she is still not able to ask for it on her own. Supportive counseling and review of plan with patient. I have been in touch with her husband regularly as well. We  may or may not be able to look towards discharge tomorrow but if not I hope that may be over the weekend or into next week.   Medical Decision Making:  Review of Psycho-Social Stressors (1), Review and summation of old records (2), Established Problem, Worsening (2), Review of Medication Regimen & Side Effects (2) and Review of New Medication or Change in Dosage (2)     Larwence Tu 09/24/2014, 5:58 PM

## 2014-09-24 NOTE — [facility] Group Notes (Signed)
[facility] Group Notes:  (Nursing/MHT/Case Management/Adjunct)  Date:  09/24/2014  Time:  11:55 PM  Type of Therapy:  Evening Wrap-up Group  Participation Level:  Did Not Attend  Participation Quality:  N/A  Affect:  N/A  Cognitive:  N/A  Insight:  None  Engagement in Group:  None  Modes of Intervention:  N/A  Summary of Progress/Problems:  Tomasita MorrowChelsea Nanta Phu Record 09/24/2014, 11:55 PM

## 2014-09-24 NOTE — Anesthesia Postprocedure Evaluation (Signed)
  Anesthesia Post-op Note  Patient: Jenna Dudley  Procedure(s) Performed: * No procedures listed *  Anesthesia type:General  Patient location: PACU  Post pain: Pain level controlled  Post assessment: Post-op Vital signs reviewed, Patient's Cardiovascular Status Stable, Respiratory Function Stable, Patent Airway and No signs of Nausea or vomiting  Post vital signs: Reviewed and stable  Last Vitals:  Filed Vitals:   09/24/14 0701  BP: 146/76  Pulse: 99  Temp: 36.4 C  Resp: 20    Level of consciousness: awake, alert  and patient cooperative  Complications: No apparent anesthesia complications

## 2014-09-24 NOTE — [facility] Group Notes (Signed)
[facility] Group Notes:  (Nursing/MHT/Case Management/Adjunct)  Date:  09/24/2014  Time: 2:02PM  Type of Therapy:  Group Therapy  Participation Level:  Did Not Attend  Summary of Progress/Problems:  Linnet Bottari De'Chelle Yusuf Yu 09/24/2014, 5:27 PM

## 2014-09-24 NOTE — Progress Notes (Signed)
INITIAL NUTRITION ASSESSMENT   INTERVENTION:  Meals and Snacks: encourage menu completion to best meet pt preferences  Nutrition Supplement Therapy: Encourage intake of Ensure Enlive po BID, each supplement provides 350 kcal and 20 grams of protein  NUTRITION DIAGNOSIS:  No nutrition diagnosis at this time   GOAL:  Patient will meet greater than or equal to 90% of their needs  MONITOR:  Energy Intake, Anthropometrics   REASON FOR ASSESSMENT:  Malnutrition Screening   ASSESSMENT:  Jenna Dudley is a 79 y.o. female with Severe recurrent major depression with psychotic features.  Past Medical History  Diagnosis Date  . Hypertension   . Hyperlipemia 07/29/14  . Chronic low back pain 07/29/14  . Spinal stenosis 07/29/14  . GERD (gastroesophageal reflux disease)   . Depression   . Esophageal spasm 07/29/14  . Bilateral carpal tunnel syndrome 07/27/14  . Degenerative arthritis of lumbar spine 07/27/14    Diet Order: regular with ensure BID  Current Nutrition: Consuming 21% meals on average per I and O sheet  Food/Nutrition-Related History: unable to determine at this time  Medications: remeron  Protein Profile:  Protein Profile: No results for input(s): ALBUMIN in the last 168 hours.   Glucose and Electrolyte/Renal profile:   Recent Labs Lab 09/21/14 1243  NA 134*  K 3.2*  CL 99*  CO2 28  BUN 19  CREATININE 0.78  CALCIUM 8.5*  GLUCOSE 298*    Anthropometrics:    Body mass index is 28.74 kg/(m^2).  Filed Weights   09/18/14 0816 09/21/14 0931 09/23/14 1117  Weight: 150 lb (68.04 kg) 152 lb (68.947 kg) 152 lb (68.947 kg)    Noted weight gain since 6/24 of 2 pounds   LOW Care Level  Mazen Marcin B. Freida BusmanAllen, RD, LDN [phone] (pager)

## 2014-09-24 NOTE — Plan of Care (Signed)
Problem: Alteration in mood Goal: LTG-Pt's behavior demonstrates decreased signs of depression (Patient's behavior demonstrates decreased signs of depression to the point the patient is safe to return home and continue treatment in an outpatient setting)  Outcome: Progressing  Patient is interacting with peers in the dayroom.  Goal: STG-Patient reports thoughts of self-harm to staff Outcome: Progressing Patient denies SI/HI.

## 2014-09-24 NOTE — Progress Notes (Signed)
Patient speech garbled.  Affect flat.  Crying spells noted at times.  Impulsive at times.  Requires frequent redirection and times difficult to redirect. Shuffling gait noted. Safety maintained.

## 2014-09-24 NOTE — [facility] Group Notes (Signed)
[facility] Group Notes:  (Nursing/MHT/Case Management/Adjunct)  Date:  09/24/2014  Time:  2:33 AM  Type of Therapy:  Group Therapy  Participation Level:  Did Not Attend   Jenna Dudley 09/24/2014, 2:33 AM

## 2014-09-24 NOTE — [facility] Group Notes (Signed)
[facility] Group Notes:  (Nursing/MHT/Case Management/Adjunct)  Date:  09/24/2014  Time:  9:50 AM  Type of Therapy:  Psychoeducational Skills  Participation Level:  Did Not Attend  Summary of Progress/Problems:  Foy GuadalajaraJasmine R Herminia Warren 09/24/2014, 9:50 AM

## 2014-09-24 NOTE — Plan of Care (Signed)
Problem: Alteration in thought process Goal: LTG-Patient behavior demonstrates decreased signs psychosis (Patient behavior demonstrates decreased signs of psychosis to the point the patient is safe to return home and continue treatment in an outpatient setting.)  Outcome: Not Progressing Patient noted talking to some unseen person in her room

## 2014-09-24 NOTE — Progress Notes (Signed)
D: Pt denies SI/HI/AVH. Patient is flat, sad with periods of crying spells, reassured and redirected for safety.  Pt stated she feels better from doing ECT, but still  appears anxious and depressed. Patient was encouraged to iinteract with peers and staff appropriately.  A: Pt was offered support and encouragement. Pt was given scheduled medications. Pt was encouraged to attend groups. Q 15 minute checks were done for safety.  R: Pt did not attend group,minimal interaction with peers and staff. Pt is taking medication. Pt has no complaints.Pt receptive to treatment and safety maintained on unit.

## 2014-09-25 ENCOUNTER — Encounter: Payer: Self-pay | Admitting: *Deleted

## 2014-09-25 ENCOUNTER — Other Ambulatory Visit: Payer: Self-pay

## 2014-09-25 ENCOUNTER — Inpatient Hospital Stay: Payer: Medicare Other

## 2014-09-25 ENCOUNTER — Inpatient Hospital Stay: Payer: Medicare Other | Admitting: Anesthesiology

## 2014-09-25 LAB — GLUCOSE, CAPILLARY
GLUCOSE-CAPILLARY: 96 mg/dL (ref 65–99)
Glucose-Capillary: 98 mg/dL (ref 65–99)

## 2014-09-25 MED ORDER — PANTOPRAZOLE SODIUM 40 MG PO TBEC
40.0000 mg | DELAYED_RELEASE_TABLET | Freq: Two times a day (BID) | ORAL | Status: DC
  Administered 2014-09-25 – 2014-09-26 (×2): 40 mg via ORAL
  Filled 2014-09-25 (×2): qty 1

## 2014-09-25 MED ORDER — LOSARTAN POTASSIUM 50 MG PO TABS
25.0000 mg | ORAL_TABLET | Freq: Every day | ORAL | Status: DC
  Administered 2014-09-26: 25 mg via ORAL
  Filled 2014-09-25: qty 1

## 2014-09-25 MED ORDER — METHOHEXITAL SODIUM 100 MG/10ML IV SOSY
60.0000 mg | PREFILLED_SYRINGE | Freq: Once | INTRAVENOUS | Status: AC
  Administered 2014-09-25: 60 mg via INTRAVENOUS

## 2014-09-25 MED ORDER — KETOROLAC TROMETHAMINE 30 MG/ML IJ SOLN
30.0000 mg | Freq: Once | INTRAMUSCULAR | Status: AC
  Administered 2014-09-25: 30 mg via INTRAVENOUS

## 2014-09-25 MED ORDER — HYDROXYZINE HCL 50 MG PO TABS: 50.0000 mg | ORAL_TABLET | Freq: Three times a day (TID) | ORAL | Status: DC | PRN

## 2014-09-25 MED ORDER — LABETALOL HCL 5 MG/ML IV SOLN
20.0000 mg | Freq: Once | INTRAVENOUS | Status: AC
  Administered 2014-09-25: 20 mg via INTRAVENOUS

## 2014-09-25 MED ORDER — LACTATED RINGERS IV SOLN
INTRAVENOUS | Status: DC
  Administered 2014-09-25 (×2): via INTRAVENOUS

## 2014-09-25 MED ORDER — LOSARTAN POTASSIUM 25 MG PO TABS: 25.0000 mg | ORAL_TABLET | Freq: Every day | ORAL | Status: DC

## 2014-09-25 MED ORDER — SUCCINYLCHOLINE CHLORIDE 20 MG/ML IJ SOLN
80.0000 mg | Freq: Once | INTRAMUSCULAR | Status: AC
  Administered 2014-09-25: 80 mg via INTRAVENOUS

## 2014-09-25 MED ORDER — PANTOPRAZOLE SODIUM 20 MG PO TBEC
20.0000 mg | DELAYED_RELEASE_TABLET | Freq: Two times a day (BID) | ORAL | Status: DC
  Filled 2014-09-25 (×3): qty 1

## 2014-09-25 NOTE — Transfer of Care (Signed)
Immediate Anesthesia Transfer of Care Note  Patient: Jenna Dudley  Procedure(s) Performed: * No procedures listed *  Patient Location: PACU  Anesthesia Type:General  Level of Consciousness: Alert, Awake, Oriented  Airway & Oxygen Therapy: Patient Spontanous Breathing  Post-op Assessment: Report given to RN  Post vital signs: Reviewed and stable  Last Vitals:  Filed Vitals:   09/25/14 1120  BP: 107/38  Pulse: 66  Temp: 36.7 C  Resp: 20    Complications: No apparent anesthesia complications

## 2014-09-25 NOTE — Progress Notes (Signed)
Today 09/25/14 at 0953 the computer states I pushed  on phlebitis on 09/18/14  IV status. I have no idea why this came up on 09/18/14 IVs status on today's IV.  I do not recall doing this and have not idea why it would even chart this.

## 2014-09-25 NOTE — Plan of Care (Signed)
Problem: Spiritual Needs Goal: Ability to function at adequate level Outcome: Not Progressing Requires frequent redirection and encouragement to perform ADL's

## 2014-09-25 NOTE — Progress Notes (Signed)
Jenna Dudley [facility] MD Progress Note  09/25/2014 4:01 PM Jenna Dudley  MRN:  [id number] Subjective: She was looking much better prior to ECT this morning although she has been sleepy all day. I suspect this may be related to the buildup of pain medicine that we have been giving her. Her blood pressure is also running quite low which I'm a little concerned about. This is probably also I think related to the standing dose of pain medicine. Her mood and thoughts have seem to be much more like her normal state. Vital signs hypotensive. No new physical complaints   Principal Problem: Severe recurrent major depression with psychotic features Diagnosis:   Patient Active Problem List   Diagnosis Date Noted  . Infection of urinary tract [N39.0] 09/17/2014  . Essential hypertension [I10] 09/17/2014  . Chronic back pain [M54.9, G89.29] 09/17/2014  . Gastric reflux [K21.9] 09/17/2014  . History of stroke [Z86.73] 09/17/2014  . Hypertension [I10] 09/16/2014  . Severe recurrent major depression with psychotic features [F33.3]    Total Time spent with patient: 30 minutes   Past Medical History:  Past Medical History  Diagnosis Date  . Hypertension   . Hyperlipemia 07/29/14  . Chronic low back pain 07/29/14  . Spinal stenosis 07/29/14  . GERD (gastroesophageal reflux disease)   . Depression   . Esophageal spasm 07/29/14  . Bilateral carpal tunnel syndrome 07/27/14  . Degenerative arthritis of lumbar spine 07/27/14    Past Surgical History  Procedure Laterality Date  . Hip surgery Right   . Replacement total knee bilateral Bilateral 07/27/14  . Inner ear surgery Right    Family History:  Family History  Problem Relation Age of Onset  . Family history unknown: Yes   Social History:  History  Alcohol Use No     History  Drug Use No    History   Social History  . Marital Status: Single    Spouse Name: N/A  . Number of Children: N/A  . Years of Education: N/A   Social History Main Topics  . Smoking  status: Former Smoker    Types: Cigarettes    Quit date: 07/29/1975  . Smokeless tobacco: Not on file  . Alcohol Use: No  . Drug Use: No  . Sexual Activity: No     Comment: postmenopause   Other Topics Concern  . None   Social History Narrative   Additional History:    Sleep: Fair  Appetite:  Fair   Assessment:   Musculoskeletal: Strength & Muscle Tone: decreased Gait & Station: unsteady Patient leans: N/A   Psychiatric Specialty Exam: Physical Exam  Constitutional: She appears well-developed and well-nourished. She appears lethargic. She has a sickly appearance.  HENT:  Head: Normocephalic and atraumatic.  Eyes: Conjunctivae are normal. Pupils are equal, round, and reactive to light.  Neck: Normal range of motion.  Cardiovascular: Normal heart sounds.   Respiratory: Effort normal.  GI: Soft.  Musculoskeletal: Normal range of motion.  Neurological: She appears lethargic.  Skin: Skin is warm and dry.  Psychiatric: Judgment and thought content normal. Her mood appears not anxious. Her affect is blunt. Her speech is delayed. She is slowed. She is not withdrawn. Thought content is not delusional. Cognition and memory are impaired. She does not express inappropriate judgment. She does not exhibit a depressed mood. She exhibits abnormal recent memory.  [name] was looking much better before ECT this morning. This afternoon she has been very sleepy and her blood pressure has been running  quite low. I suspect this may be related to standing doses of pain medicine.    Review of Systems  Constitutional: Negative.   HENT: Negative.   Eyes: Negative.   Respiratory: Negative.   Cardiovascular: Negative.   Gastrointestinal: Negative.   Musculoskeletal: Negative.   Skin: Negative.   Neurological: Negative.   Psychiatric/Behavioral: Negative for suicidal ideas and substance abuse. The patient is not nervous/anxious and does not have insomnia.     Blood pressure 96/29, pulse 70,  temperature 98 F (36.7 C), temperature source Tympanic, resp. rate 16, height 5\' 1"  (1.549 m), weight 69.854 kg (154 lb), SpO2 92 %.Body mass index is 29.11 kg/(m^2).  General Appearance: Fairly Groomed  Patent attorney::  Fair  Speech:  Blocked and Slurred  Volume:  Decreased  Mood:  Euthymic  Affect:  Constricted  Thought Process:  Tangential  Orientation:  Full (Time, Place, and Person)  Thought Content:  Negative  Suicidal Thoughts:  No  Homicidal Thoughts:  No  Memory:  Immediate;   Fair Recent;   Poor Remote;   Fair  Judgement:  Impaired  Insight:  Lacking  Psychomotor Activity:  Decreased  Concentration:  Poor  Recall:  Poor  Fund of Knowledge:Fair  Language: Fair  Akathisia:  No  Handed:  Right  AIMS (if indicated):     Assets:  Financial Resources/Insurance Housing Social Support  ADL's:  Impaired  Cognition: Impaired,  Moderate  Sleep:  Number of Hours: 6.25     Current Medications: Current Facility-Administered Medications  Medication Dose Route Frequency Provider Last Rate Last Dose  . acetaminophen-codeine (TYLENOL #3) 300-30 MG per tablet 1 tablet  1 tablet Oral Q6H Audery Amel, MD   1 tablet at 09/25/14 0500  . alum & mag hydroxide-simeth (MAALOX/MYLANTA) 200-200-20 MG/5ML suspension 30 mL  30 mL Oral Q4H PRN Audery Amel, MD      . cephALEXin (KEFLEX) capsule 500 mg  500 mg Oral 3 times per day Audery Amel, MD   500 mg at 09/25/14 0602  . feeding supplement (ENSURE ENLIVE) (ENSURE ENLIVE) liquid 237 mL  237 mL Oral BID BM Kerin Salen, MD   237 mL at 09/24/14 1400  . hydrochlorothiazide (MICROZIDE) capsule 12.5 mg  12.5 mg Oral Daily Audery Amel, MD   12.5 mg at 09/24/14 0916  . hydrOXYzine (ATARAX/VISTARIL) tablet 50 mg  50 mg Oral TID PRN Kerin Salen, MD   50 mg at 09/19/14 1553  . lactated ringers infusion   Intravenous Continuous Audery Amel, MD 50 mL/hr at 09/25/14 1225    . losartan (COZAAR) tablet 50 mg  50 mg Oral Daily Audery Amel, MD   50 mg at 09/25/14 0602  . magnesium hydroxide (MILK OF MAGNESIA) suspension 30 mL  30 mL Oral Daily PRN Audery Amel, MD      . mirtazapine (REMERON) tablet 45 mg  45 mg Oral QHS Audery Amel, MD   45 mg at 09/24/14 2157  . OLANZapine (ZYPREXA) tablet 20 mg  20 mg Oral QHS Audery Amel, MD   20 mg at 09/24/14 2157  . pantoprazole (PROTONIX) EC tablet 20 mg  20 mg Oral BID Audery Amel, MD      . potassium chloride SA (K-DUR,KLOR-CON) CR tablet 20 mEq  20 mEq Oral Daily Audery Amel, MD   20 mEq at 09/24/14 0916  . QUEtiapine (SEROQUEL) tablet 300 mg  300 mg Oral QHS John T  Clapacs, MD   300 mg at 09/24/14 2159  . sertraline (ZOLOFT) tablet 200 mg  200 mg Oral Daily Audery Amel, MD   200 mg at 09/24/14 0916  . simvastatin (ZOCOR) tablet 40 mg  40 mg Oral q1800 Audery Amel, MD   40 mg at 09/24/14 1734   Facility-Administered Medications Ordered in Other Encounters  Medication Dose Route Frequency Provider Last Rate Last Dose  . dextrose 5 % solution 250 mL  250 mL Intravenous Once Audery Amel, MD      . ketorolac (TORADOL) 30 MG/ML injection 30 mg  30 mg Intravenous Once Audery Amel, MD      . ketorolac (TORADOL) 30 MG/ML injection 30 mg  30 mg Intravenous Once Audery Amel, MD      . labetalol (NORMODYNE,TRANDATE) injection 20 mg  20 mg Intravenous Once Audery Amel, MD      . labetalol (NORMODYNE,TRANDATE) injection 20 mg  20 mg Intravenous Once Audery Amel, MD      . lactated ringers infusion   Intravenous Continuous Audery Amel, MD      . methohexital Sodium 60 mg  60 mg Intravenous Once Audery Amel, MD      . methohexital Sodium 60 mg  60 mg Intravenous Once Audery Amel, MD      . succinylcholine (ANECTINE) injection 80 mg  80 mg Intravenous Once Audery Amel, MD      . succinylcholine (ANECTINE) injection 80 mg  80 mg Intravenous Once Audery Amel, MD        Lab Results:  Results for orders placed or performed during the hospital  encounter of 09/17/14 (from the past 48 hour(s))  Glucose, capillary     Status: Abnormal   Collection Time: 09/23/14  8:22 PM  Result Value Ref Range   Glucose-Capillary 140 (H) 65 - 99 mg/dL  Glucose, capillary     Status: Abnormal   Collection Time: 09/24/14 12:10 PM  Result Value Ref Range   Glucose-Capillary 133 (H) 65 - 99 mg/dL  Glucose, capillary     Status: None   Collection Time: 09/24/14  4:38 PM  Result Value Ref Range   Glucose-Capillary 90 65 - 99 mg/dL  Glucose, capillary     Status: None   Collection Time: 09/25/14  7:11 AM  Result Value Ref Range   Glucose-Capillary 98 65 - 99 mg/dL    Physical Findings: AIMS: Facial and Oral Movements Muscles of Facial Expression: None, normal Lips and Perioral Area: None, normal Jaw: None, normal Tongue: None, normal,Extremity Movements Upper (arms, wrists, hands, fingers): None, normal Lower (legs, knees, ankles, toes): None, normal, Trunk Movements Neck, shoulders, hips: None, normal, Overall Severity Severity of abnormal movements (highest score from questions above): None, normal Incapacitation due to abnormal movements: None, normal, Dental Status Current problems with teeth and/or dentures?: Yes (poor dental hygiene) Does patient usually wear dentures?:  (unable to respond)  CIWA:    COWS:     Treatment Plan Summary: Medication management we'll continue patient's current medications. I will be out of town all of this next week which means that ECT will not be available. I had hoped that she could be discharged today but her low blood pressure and somnolence make that seem unwise. I have spoken with her husband and told her that I think this may be a build up of her pain medicine. I have discontinued the standing pain medicine. I will also  cut down or hold her blood pressure medicine. I am going to printout her prescriptions but my suggestion to her husband is that we keep her overnight and reevaluate tomorrow. If she is  well enough to go home the weekend doctor can discharge her, but if she is not well enough she can certainly stay in the hospital until she is looking better. Channing MuttersRoy expresses an understanding. He is still going to be visiting her daily which is wonderful.  Medical Decision Making:  Review of Psycho-Social Stressors (1), Review and summation of old records (2), Established Problem, Worsening (2), Review of Medication Regimen & Side Effects (2) and Review of New Medication or Change in Dosage (2)     John Clapacs 09/25/2014, 4:01 PM

## 2014-09-25 NOTE — Plan of Care (Signed)
Problem: Alteration in thought process Goal: LTG-Patient behavior demonstrates decreased signs psychosis (Patient behavior demonstrates decreased signs of psychosis to the point the patient is safe to return home and continue treatment in an outpatient setting.)  Outcome: Not Progressing Confused, trying to note opening the door at wall

## 2014-09-25 NOTE — [facility] Group Notes (Signed)
[facility] Note   09/25/2014 10:42 AM  Participation Quality:  Did not attend.   Camary Sosa L Diamantina Edinger MSW, [address]CSWA

## 2014-09-25 NOTE — Anesthesia Preprocedure Evaluation (Signed)
Anesthesia Evaluation  Patient identified by MRN, date of birth, ID band  Reviewed: Allergy & Precautions, NPO status , Patient's Chart, lab work & pertinent test results  History of Anesthesia Complications Negative for: history of anesthetic complications  Airway Mallampati: II       Dental  (+) Edentulous Upper, Edentulous Lower   Pulmonary former smoker,  + rhonchi         Cardiovascular hypertension, Pt. on home beta blockers Normal cardiovascular exam    Neuro/Psych Depression    GI/Hepatic Neg liver ROS,   Endo/Other    Renal/GU negative Renal ROS  negative genitourinary   Musculoskeletal  (+) Arthritis -,   Abdominal (+) + obese,   Peds negative pediatric ROS (+)  Hematology   Anesthesia Other Findings   Reproductive/Obstetrics negative OB ROS                             Anesthesia Physical Anesthesia Plan  ASA: III  Anesthesia Plan: General   Post-op Pain Management:    Induction: Intravenous  Airway Management Planned: Mask  Additional Equipment:   Intra-op Plan:   Post-operative Plan:   Informed Consent: I have reviewed the patients History and Physical, chart, labs and discussed the procedure including the risks, benefits and alternatives for the proposed anesthesia with the patient or authorized representative who has indicated his/her understanding and acceptance.     Plan Discussed with: CRNA  Anesthesia Plan Comments:         Anesthesia Quick Evaluation

## 2014-09-25 NOTE — Anesthesia Postprocedure Evaluation (Signed)
  Anesthesia Post-op Note  Patient: Jenna Dudley  Procedure(s) Performed: * No procedures listed *  Anesthesia type:General  Patient location: PACU  Post pain: Pain level controlled  Post assessment: Post-op Vital signs reviewed, Patient's Cardiovascular Status Stable, Respiratory Function Stable, Patent Airway and No signs of Nausea or vomiting  Post vital signs: Reviewed and stable  Last Vitals:  Filed Vitals:   09/25/14 0932  BP: 107/40  Pulse: 79  Temp: 36.9 C  Resp: 18    Level of consciousness: awake, alert  and patient cooperative  Complications: No apparent anesthesia complications

## 2014-09-25 NOTE — Procedures (Signed)
ECT SERVICES Physician's Interval Evaluation & Treatment Note  Patient Identification: Jenna Dudley MRN:  [id number] Date of Evaluation:  09/25/2014 TX #: [id number]  MADRS: 33  MMSE: 19  P.E. Findings: No change to basic physical exam  No change to physical exam  Psychiatric Interval Note:  Patient continues to be withdrawn and appeared to be very depressed. Confused. Nonetheless markedly improved from earlier in the week.  Subjective:  Patient is a 79 y.o. female seen for evaluation for Electroconvulsive Therapy. Patient herself has no specific complaints other than to mention that her back pain is not as bad as it was previously  Treatment Summary:   [x]   Right Unilateral             []  Bilateral   % Energy : 0.3 ms 100%   Impedance: 1600 ohms  Seizure Energy Index: 3476 V squared  Postictal Suppression Index: 24%  Seizure Concordance Index: 92%  Medications  Pre Shock: Toradol 30 mg, labetalol 20 mg, Brevital 60 mg, succinylcholine 80 mg  Post Shock:    Seizure Duration: 44 seconds by EMG, 70 seconds by EEG   Comments: I will talk to her husband about possible discharge. Next treatment will be scheduled for July 13 after my vacation   Lungs:  [x]   Clear to auscultation               []  Other:   Heart:    [x]   Regular rhythm             []  irregular rhythm    [x]   Previous H&P reviewed, patient examined and there are NO CHANGES                 []   Previous H&P reviewed, patient examined and there are changes noted.   Mordecai RasmussenJohn Clapacs, MD 7/1/201611:00 AM

## 2014-09-25 NOTE — Plan of Care (Signed)
Problem: Ineffective individual coping Goal: LTG: Patient will report a decrease in negative feelings Outcome: Not Applicable Date Met:  48/88/91 Pt remains disorganized and confused. Requires frequent redirections. Med compliant. Denies SI/HI.  q 15 min checks maintained for safety.

## 2014-09-25 NOTE — Progress Notes (Signed)
Ect today.  Drowsy, confusion noted.  Requires frequent redirection.  Safety maintained.

## 2014-09-25 NOTE — [facility] Suicide Risk Assessment (Deleted)
[facility] INPATIENT:  Family/Significant Other Suicide Prevention Education  Suicide Prevention Education:  Contact Attempts: Angelene GiovanniDonald Bringht (father) has been identified by the patient as the family member/significant other with whom the patient will be residing, and identified as the person(s) who will aid the patient in the event of a mental health crisis.  With written consent from the patient, two attempts were made to provide suicide prevention education, prior to and/or following the patient's discharge.  We were unsuccessful in providing suicide prevention education.  A suicide education pamphlet was given to the patient to share with family/significant other.  Date and time of first attempt: 09/24/2014/2:00 PM Date and time of second attempt:09/25/2014/ 10:00 AM  Lamija Besse L Treena Cosman MSW, LCSWA  09/25/2014, 10:38 AM

## 2014-09-25 NOTE — [facility] Group Notes (Signed)
[facility] Group Notes:  (Nursing/MHT/Case Management/Adjunct)  Date:  09/25/2014  Time:  12:02 PM  Type of Therapy:  Psychoeducational Skills  Participation Level:  Did Not Attend  Part Summary of Progress/Problems:  Jenna Dudley 09/25/2014, 12:02 PM

## 2014-09-25 NOTE — Tx Team (Signed)
Interdisciplinary Treatment Plan Update (Adult)  Date:  09/25/2014 Time Reviewed:  2:22 PM  Progress in Treatment: Attending groups: No. Participating in groups:  No. Taking medication as prescribed:  Yes. Tolerating medication:  Yes. Family/Significant othe contact made:  No, will contact:  if patient provides consent Patient understands diagnosis:  No. Patient is currently presenting psychotic symptoms with no insight. Discussing patient identified problems/goals with staff:  Yes. Medical problems stabilized or resolved:  Yes. Denies suicidal/homicidal ideation: Yes. Issues/concerns per patient self-inventory:  No. Other:  New problem(s) identified: No, Describe:  none reported  Discharge Plan or Barriers: Patient will discharge home with her husband and will need followup care appointment.  Reason for Continuation of Hospitalization: Delusions  Depression  Comments:  Estimated length of stay: up to 5 days  New goal(s):  Review of initial/current patient goals per problem list:   See Care Plan  Attendees: Physician:  Mordecai RasmussenJohn Clapacs, MD 7/1/20162:22 PM  Other:  Beryl MeagerJason Cyndel Griffey, LCSWA 7/1/20162:22 PM  Other:  7/1/20162:22 PM  Other:  7/1/20162:22 PM  Other:  7/1/20162:22 PM  Other:  7/1/20162:22 PM  Other:  7/1/20162:22 PM  Other:  7/1/20162:22 PM  Other:   7/1/20162:22 PM   Scribe for Treatment Team:   Beryl MeagerIngle, Phoenix Dresser T, 09/25/2014, 2:22 PM

## 2014-09-26 MED ORDER — PANTOPRAZOLE SODIUM 40 MG PO TBEC: 40.0000 mg | DELAYED_RELEASE_TABLET | Freq: Two times a day (BID) | ORAL | Status: DC

## 2014-09-26 MED ORDER — LOSARTAN POTASSIUM 25 MG PO TABS: 25.0000 mg | ORAL_TABLET | Freq: Every day | ORAL | Status: DC

## 2014-09-26 MED ORDER — HYDROXYZINE HCL 50 MG PO TABS: 50.0000 mg | ORAL_TABLET | Freq: Three times a day (TID) | ORAL | Status: DC | PRN

## 2014-09-26 NOTE — Progress Notes (Signed)
  [facility]  Will you be returning to the same living situation after discharge:  Yes,  Home  At discharge, do you have transportation home?: Yes,  Husband  Do you have the ability to pay for your medications: Yes,  Insurance, income   Release of information consent forms completed and in the chart;  Patient's signature needed at discharge.  Patient to Follow up at: Follow-up Information    Go to [facility].   Why:  For follow-up care; appointment for hospital followup at 2:40pm on  10/06/14   Contact information:   [address] [place], [place] [address] Ph [phone] Fax [phone]       Patient denies SI/HI: Yes,  Yes    Safety Planning and Suicide Prevention discussed: Yes,  With husband   Have you used any form of tobacco in the last 30 days? (Cigarettes, Smokeless Tobacco, Cigars, and/or Pipes): Patient Refused Screening  Has patient been referred to the Quitline?: Patient refused referral  Rondall AllegraCandace L Bexley Mclester MSW, LCSWA  09/26/2014, 12:05 PM

## 2014-09-26 NOTE — Plan of Care (Signed)
Problem: Alteration in mood Goal: LTG-Pt's behavior demonstrates decreased signs of depression (Patient's behavior demonstrates decreased signs of depression to the point the patient is safe to return home and continue treatment in an outpatient setting)  Outcome: Not Progressing Patient still isolates to self not interacting with peers and staff.

## 2014-09-26 NOTE — [facility] Group Notes (Signed)
[facility] Group Notes:  (Nursing/MHT/Case Management/Adjunct)  Date:  09/26/2014  Time:  3:44 AM  Type of Therapy:  Group Therapy  Participation Level:  Did Not Attend  Veva Holesshley Imani Brittinie Wherley 09/26/2014, 3:44 AM

## 2014-09-26 NOTE — Plan of Care (Signed)
Problem: Alteration in thought process Goal: LTG-Patient behavior demonstrates decreased signs psychosis (Patient behavior demonstrates decreased signs of psychosis to the point the patient is safe to return home and continue treatment in an outpatient setting.)  Outcome: Not Progressing Patient still appears to be responding to internal stimuli, thoughts are disorganized, speech incoherent.

## 2014-09-26 NOTE — Progress Notes (Signed)
Patient continues to have confusion and delusional thinking.  Keeps repeating that she is not going on a plane or going to [place].  Oriented to person only.  Speech garbled and incoherent at times.  Denies any SI.   Discharge instructions reviewed with patient husband, verbalized understanding.  Prescriptions given. Personal belongings returned.  Care relinquished to husband.

## 2014-09-26 NOTE — [facility] Suicide Risk Assessment (Signed)
[facility] INPATIENT:  Family/Significant Other Suicide Prevention Education  Suicide Prevention Education:  Education Completed; Armanda HeritageRoy Goostree [phone]) has been identified by the patient as the family member/significant other with whom the patient will be residing, and identified as the person(s) who will aid the patient in the event of a mental health crisis (suicidal ideations/suicide attempt).  With written consent from the patient, the family member/significant other has been provided the following suicide prevention education, prior to the and/or following the discharge of the patient.  The suicide prevention education provided includes the following:  Suicide risk factors  Suicide prevention and interventions  National Suicide Hotline telephone number  [facility] assessment telephone number  [facility] Emergency Assistance 911  [facility] and/or Residential Mobile Crisis Unit telephone number  Request made of family/significant other to:  Remove weapons (e.g., guns, rifles, knives), all items previously/currently identified as safety concern.    Remove drugs/medications (over-the-counter, prescriptions, illicit drugs), all items previously/currently identified as a safety concern.  The family member/significant other verbalizes understanding of the suicide prevention education information provided.  The family member/significant other agrees to remove the items of safety concern listed above.  Sempra EnergyCandace L Kano Heckmann MSW, LCSWA  09/26/2014, 12:11 PM

## 2014-09-26 NOTE — Discharge Summary (Signed)
Physician Discharge Summary Note  Patient:  Jenna Dudley is an 79 y.o., female MRN:  [id number] DOB:  [birth year] Patient phone:  [phone] (home)  Patient address:   [address] [place] [id number],  Total Time spent with patient: 30 minutes  Date of Admission:  09/17/2014 Date of Discharge: 09/26/2014   Reason for Admission:  Worsening of depression and suicidal ideation.  Chief Complaint: "I know I'm not perfect" HPI: Information from the patient and the chart. This is a 79 year old woman well known to me from years of maintenance ECT who had recently become noncompliant with her recommended ECT treatment. More recently than that she had become noncompliant with her anti-psychotic and antidepressive medication. Her husband reports that she was decompensating badly becoming more confused and paranoid and withdrawn. More bizarre behavior at home. I had spoken to him last week and advised him to try and maintain her if it was safe at home and then bring her in for ECT but it appears that things had decompensated too badly and he brought her to the emergency room yesterday. Patient is only minimally able to give history. She is confused and is constantly ruminating about inappropriate guilt. There is no known specific new stress other than noncompliance with treatment.  Past psychiatric history: This patient has a long history of recurrent psychotic depression. When she gets depressed and psychotic she tends to believe that she has done some kind of evil thing which she can never articulate. She has passive suicidal thoughts and talks about people hating her and wanting her to be dead. She will eat less sleep less and become unable to interact with her family. Clearly miserable. Not sure if she is ever actually had a suicide attempt but she decompensates to the point where she is unable to care for herself at all. No known history of mania. Patient had responded well to ECT and had been on a  regimen of every 2 week ECT treatments. When we have tried to stretch it out much beyond that she often decompensates. For reasons I'm not clear about she had recently decided to skip a couple of treatments and predictably she has gotten in this situation.  Social history: Patient is married and has 5 adult daughters. Husband is knowledgeable about her condition and very understanding. Has always been very supportive and appropriate in managing her condition. They appear to have a very positive relationship. She has a good relationship with most of her daughters. I believe that one of him is sometimes argumentative with her but as far as I know that situation has improved recently.  Medical history: High blood pressure. Has a past history of a stroke. Gastric reflux symptoms. Chronic low back pain. Probably having a urinary tract infection now.  Family history: Unknown  Current medication: Hydrochlorothiazide 12.5 mg a day Cozaar 50 mg per day mirtazapine 45 mg at night quetiapine 300 mg at night and Zoloft 100 mg per day simvastatin 40 mg per day   Principal Problem: Severe recurrent major depression with psychotic features Discharge Diagnoses: Patient Active Problem List   Diagnosis Date Noted  . Infection of urinary tract [N39.0] 09/17/2014  . Essential hypertension [I10] 09/17/2014  . Chronic back pain [M54.9, G89.29] 09/17/2014  . Gastric reflux [K21.9] 09/17/2014  . History of stroke [Z86.73] 09/17/2014  . Hypertension [I10] 09/16/2014  . Severe recurrent major depression with psychotic features [F33.3]     Musculoskeletal: Strength & Muscle Tone: within normal limits Gait & Station:  normal Patient leans: N/A  Psychiatric Specialty Exam: Physical Exam  Nursing note and vitals reviewed.   Review of Systems  Musculoskeletal: Positive for back pain and neck pain.  All other systems reviewed and are negative.   Blood pressure 122/72, pulse 80, temperature 98.2 F (36.8 C),  temperature source Oral, resp. rate 20, height 5\' 1"  (1.549 m), weight 69.854 kg (154 lb), SpO2 94 %.Body mass index is 29.11 kg/(m^2).  See SRA.                                                  Sleep:  Number of Hours: 5.5   Have you used any form of tobacco in the last 30 days? (Cigarettes, Smokeless Tobacco, Cigars, and/or Pipes): Patient Refused Screening  Has this patient used any form of tobacco in the last 30 days? (Cigarettes, Smokeless Tobacco, Cigars, and/or Pipes) No  Past Medical History:  Past Medical History  Diagnosis Date  . Hypertension   . Hyperlipemia 07/29/14  . Chronic low back pain 07/29/14  . Spinal stenosis 07/29/14  . GERD (gastroesophageal reflux disease)   . Depression   . Esophageal spasm 07/29/14  . Bilateral carpal tunnel syndrome 07/27/14  . Degenerative arthritis of lumbar spine 07/27/14    Past Surgical History  Procedure Laterality Date  . Hip surgery Right   . Replacement total knee bilateral Bilateral 07/27/14  . Inner ear surgery Right    Family History:  Family History  Problem Relation Age of Onset  . Family history unknown: Yes   Social History:  History  Alcohol Use No     History  Drug Use No    History   Social History  . Marital Status: Single    Spouse Name: N/A  . Number of Children: N/A  . Years of Education: N/A   Social History Main Topics  . Smoking status: Former Smoker    Types: Cigarettes    Quit date: 07/29/1975  . Smokeless tobacco: Not on file  . Alcohol Use: No  . Drug Use: No  . Sexual Activity: No     Comment: postmenopause   Other Topics Concern  . None   Social History Narrative    Past Psychiatric History: Hospitalizations:  Outpatient Care:  Substance Abuse Care:  Self-Mutilation:  Suicidal Attempts:  Violent Behaviors:   Risk to Self: Is patient at risk for suicide?: Yes What has been your use of drugs/alcohol within the last 12 months?: no Risk to Others:   Prior  Inpatient Therapy:   Prior Outpatient Therapy:    Level of Care:  OP  Hospital Course:    Ms. Lenk is an 79 year old female with history of severe depression admitted for ECT treatment.  1. Suicidal ideation. This has resolved. The patient is able to contract for safety.  2. Mood. The patient has been maintained on a combination of apraxia and Seroquel for psychotic symptoms and mood stabilization and Zoloft and Remeron for depression. She received ECT treatment.  3. Hypertension. She is taking Cozaar.  4. Dyslipidemia. She is on Zocor.  5. Chronic pain. Standing dose of pain medication was discontinued due to excessive sedation.  6. Anxiety. She is on Atarax.  7. Disposition. She is discharged to home with her husband. She will follow up with Dr. Toni Amend.   Consults:  None  Significant  Diagnostic Studies:  None  Discharge Vitals:   Blood pressure 122/72, pulse 80, temperature 98.2 F (36.8 C), temperature source Oral, resp. rate 20, height  (1.549 m), weight 69.854 kg (154 lb), SpO2 94 %. Body mass index is 29.11 kg/(m^2). Lab Results:   Results for orders placed or performed during the hospital encounter of 09/17/14 (from the past 72 hour(s))  Glucose, capillary     Status: Abnormal   Collection Time: 09/23/14  1:18 PM  Result Value Ref Range   Glucose-Capillary 105 (H) 65 - 99 mg/dL   Comment 1 Notify RN   Glucose, capillary     Status: Abnormal   Collection Time: 09/23/14  8:22 PM  Result Value Ref Range   Glucose-Capillary 140 (H) 65 - 99 mg/dL  Glucose, capillary     Status: Abnormal   Collection Time: 09/24/14 12:10 PM  Result Value Ref Range   Glucose-Capillary 133 (H) 65 - 99 mg/dL  Glucose, capillary     Status: None   Collection Time: 09/24/14  4:38 PM  Result Value Ref Range   Glucose-Capillary 90 65 - 99 mg/dL  Glucose, capillary     Status: None   Collection Time: 09/25/14  7:11 AM  Result Value Ref Range   Glucose-Capillary 98 65 - 99 mg/dL   Glucose, capillary     Status: None   Collection Time: 09/25/14  9:23 PM  Result Value Ref Range   Glucose-Capillary 96 65 - 99 mg/dL    Physical Findings: AIMS: Facial and Oral Movements Muscles of Facial Expression: None, normal Lips and Perioral Area: None, normal Jaw: None, normal Tongue: None, normal,Extremity Movements Upper (arms, wrists, hands, fingers): None, normal Lower (legs, knees, ankles, toes): None, normal, Trunk Movements Neck, shoulders, hips: None, normal, Overall Severity Severity of abnormal movements (highest score from questions above): None, normal Incapacitation due to abnormal movements: None, normal, Dental Status Current problems with teeth and/or dentures?: Yes (poor dental hygiene) Does patient usually wear dentures?:  (unable to respond)  CIWA:    COWS:      See Psychiatric Specialty Exam and Suicide Risk Assessment completed by Attending Physician prior to discharge.  Discharge destination:  Home  Is patient on multiple antipsychotic therapies at discharge:  Yes,   Do you recommend tapering to monotherapy for antipsychotics?  No   Has Patient had three or more failed trials of antipsychotic monotherapy by history:  Yes,   Antipsychotic medications that previously failed include:   1.  Zyprexa., 2.  Seroquel. and 3.  Risperdal.    Recommended Plan for Multiple Antipsychotic Therapies: Additional reason(s) for multiple antispychotic treatment:  Treatment resistant depression with severe insomnia  Discharge Instructions    Diet - low sodium heart healthy    Complete by:  As directed      Increase activity slowly    Complete by:  As directed             Medication List    STOP taking these medications        losartan-hydrochlorothiazide 50-12.5 MG per tablet  Commonly known as:  HYZAAR      TAKE these medications      Indication   acetaminophen-codeine 300-30 MG per tablet  Commonly known as:  TYLENOL #3  Take 1 tablet by mouth  every 8 (eight) hours as needed for moderate pain or severe pain.      hydrOXYzine 50 MG tablet  Commonly known as:  ATARAX/VISTARIL  Take 1 tablet (  50 mg total) by mouth 3 (three) times daily as needed for anxiety.      losartan 25 MG tablet  Commonly known as:  COZAAR  Take 1 tablet (25 mg total) by mouth daily.      mirtazapine 45 MG tablet  Commonly known as:  REMERON  Take 45 mg by mouth at bedtime.      OLANZapine 20 MG tablet  Commonly known as:  ZYPREXA  Take 20 mg by mouth at bedtime.   Indication:  Major Depressive Disorder     omeprazole 20 MG capsule  Commonly known as:  PRILOSEC  Take 20 mg by mouth 2 (two) times daily before a meal.   Indication:  GERD-Related Laryngitis     potassium chloride SA 20 MEQ tablet  Commonly known as:  K-DUR,KLOR-CON  Take 20 mEq by mouth daily.      QUEtiapine 300 MG tablet  Commonly known as:  SEROQUEL  Take 300 mg by mouth at bedtime.      sertraline 100 MG tablet  Commonly known as:  ZOLOFT  Take 200 mg by mouth daily. 2 tabs in the am   Indication:  Major Depressive Disorder     simvastatin 40 MG tablet  Commonly known as:  ZOCOR  Take 40 mg by mouth at bedtime.   Indication:  Type II B Hyperlipidemia           Follow-up Information    Go to [facility].   Why:  For follow-up care; appointment for hospital followup at 2:40pm on  10/06/14   Contact information:   [address] [place], [place] [address] Ph [phone] Fax [phone]       Follow-up recommendations:  Activity:  As tolerated Diet:  Low sodium heart healthy Other:  Keep follow-up appointments  Comments:    Total Discharge Time: 35 min.  Signed: Kristine Linea 09/26/2014, 11:31 AM

## 2014-09-26 NOTE — Progress Notes (Signed)
D: Pt denies SI/HI/AVH. Pt is pleasantly confused to time, place and situation, frequent redirection used for safety. Patient appears anxious and restless, was tearful this evening and sometimes inconsolable. Pt stated she feels better from doing ECT, but not interacting with peers and staff appropriately.  A: Pt was offered support and encouragement. Pt was given scheduled medications. Pt was encouraged to attend groups. Q 15 minute checks were done for safety.  R: Pt did not attend groups meetings, isolates to self in her room, safety maintained on unit.

## 2014-09-26 NOTE — [facility] Suicide Risk Assessment (Addendum)
[facility] Discharge Suicide Risk Assessment   Demographic Factors:  Age 79 or older and Caucasian  Total Time spent with patient: 30 minutes  Musculoskeletal: Strength & Muscle Tone: within normal limits Gait & Station: normal Patient leans: N/A  Psychiatric Specialty Exam: Physical Exam  Nursing note and vitals reviewed.   Review of Systems  Musculoskeletal: Positive for back pain and neck pain.  All other systems reviewed and are negative.   Blood pressure 122/72, pulse 80, temperature 98.2 F (36.8 C), temperature source Oral, resp. rate 20, height  (1.549 m), weight 69.854 kg (154 lb), SpO2 94 %.Body mass index is 29.11 kg/(m^2).  General Appearance: Casual  Eye Contact::  Good  Speech:  Clear and Coherent409  Volume:  Normal  Mood:  Euthymic  Affect:  Appropriate  Thought Process:  Goal Directed  Orientation:  Full (Time, Place, and Person)  Thought Content:  WDL  Suicidal Thoughts:  No  Homicidal Thoughts:  No  Memory:  Immediate;   Fair Recent;   Fair Remote;   Fair  Judgement:  Fair  Insight:  Fair  Psychomotor Activity:  Normal  Concentration:  Fair  Recall:  [employer] of Knowledge:Fair  Language: Fair  Akathisia:  No  Handed:  Right  AIMS (if indicated):     Assets:  Communication Skills Desire for Improvement Financial Resources/Insurance Housing Social Support  Sleep:  Number of Hours: 5.5  Cognition: WNL  ADL's:  Intact   Have you used any form of tobacco in the last 30 days? (Cigarettes, Smokeless Tobacco, Cigars, and/or Pipes): Patient Refused Screening  Has this patient used any form of tobacco in the last 30 days? (Cigarettes, Smokeless Tobacco, Cigars, and/or Pipes) No  Mental Status Per Nursing Assessment::   On Admission:   (Unable to asses. No responce)  Current Mental Status by Physician: NA  Loss Factors: NA  Historical Factors: NA  Risk Reduction Factors:   Sense of responsibility to family, Living with another person,  especially a relative and Positive social support  Continued Clinical Symptoms:  Depression:   Severe  Cognitive Features That Contribute To Risk:  None    Suicide Risk:  Minimal: No identifiable suicidal ideation.  Patients presenting with no risk factors but with morbid ruminations; may be classified as minimal risk based on the severity of the depressive symptoms  Principal Problem: Severe recurrent major depression with psychotic features Discharge Diagnoses:  Patient Active Problem List   Diagnosis Date Noted  . Infection of urinary tract [N39.0] 09/17/2014  . Essential hypertension [I10] 09/17/2014  . Chronic back pain [M54.9, G89.29] 09/17/2014  . Gastric reflux [K21.9] 09/17/2014  . History of stroke [Z86.73] 09/17/2014  . Hypertension [I10] 09/16/2014  . Severe recurrent major depression with psychotic features [F33.3]     Follow-up Information    Go to [facility].   Why:  For follow-up care; appointment for hospital followup at 2:40pm on  10/06/14   Contact information:   [address] [place], [place] [address] Ph [phone] Fax [phone]       Plan Of Care/Follow-up recommendations:  Activity:  As tolerated. Diet:  Low sodium heart healthy. Other:  Keep follow-up appointments.  Is patient on multiple antipsychotic therapies at discharge:  Yes,   Do you recommend tapering to monotherapy for antipsychotics?  No   Has Patient had three or more failed trials of antipsychotic monotherapy by history:  Yes,   Antipsychotic medications that previously failed include:   1.  Zyprexa., 2.  Seroquel. and 3.  Risperdal.  Recommended Plan for Multiple Antipsychotic Therapies: Additional reason(s) for multiple antispychotic treatment:  Treatment resistant depression with severe insomnia NA    Jenna Dudley 09/26/2014, 11:27 AM

## 2014-09-26 NOTE — Plan of Care (Signed)
Problem: Alteration in thought process Goal: LTG-Patient has not harmed self or others in at least 2 days Outcome: Progressing No self harm     

## 2014-09-29 NOTE — Progress Notes (Signed)
AVS H&P Discharge Summary faxed to [facility] for hospital follow-up °

## 2014-10-06 ENCOUNTER — Other Ambulatory Visit: Payer: Self-pay

## 2014-10-06 ENCOUNTER — Ambulatory Visit (INDEPENDENT_AMBULATORY_CARE_PROVIDER_SITE_OTHER): Payer: Medicare Other | Admitting: Psychiatry

## 2014-10-06 ENCOUNTER — Encounter: Payer: Self-pay | Admitting: Psychiatry

## 2014-10-06 DIAGNOSIS — F332 Major depressive disorder, 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 features: Secondary | ICD-10-CM | POA: Insufficient documentation

## 2014-10-06 DIAGNOSIS — F333 Major depressive disorder, recurrent, severe with psychotic symptoms: Secondary | ICD-10-CM

## 2014-10-06 NOTE — Progress Notes (Signed)
BH MD/PA/NP OP Progress Note  10/06/2014 3:28 PM BRIGGITTE Dudley  MRN:  [id number]  Subjective:  Patient continues to be delusional and have hallucinations and depressed frightened mood Chief Complaint:  "I'm sorry" Visit Diagnosis:     ICD-9-CM ICD-10-CM   1. Severe recurrent major depressive disorder with psychotic features 296.34 F33.3     Past Medical History:  Past Medical History  Diagnosis Date  . Hypertension   . Hyperlipemia 07/29/14  . Chronic low back pain 07/29/14  . Spinal stenosis 07/29/14  . GERD (gastroesophageal reflux disease)   . Depression   . Esophageal spasm 07/29/14  . Bilateral carpal tunnel syndrome 07/27/14  . Degenerative arthritis of lumbar spine 07/27/14    Past Surgical History  Procedure Laterality Date  . Hip surgery Right   . Replacement total knee bilateral Bilateral 07/27/14  . Inner ear surgery Right    Family History:  Family History  Problem Relation Age of Onset  . Family history unknown: Yes   Social History:  History   Social History  . Marital Status: Single    Spouse Name: N/A  . Number of Children: N/A  . Years of Education: N/A   Social History Main Topics  . Smoking status: Former Smoker    Types: Cigarettes    Quit date: 07/29/1975  . Smokeless tobacco: Not on file  . Alcohol Use: No  . Drug Use: No  . Sexual Activity: No     Comment: postmenopause   Other Topics Concern  . None   Social History Narrative   Additional History: Patient seen accompanied by her husband [name]. This is a follow-up from her recent hospitalization. At that time of discharge she was much better than on admission but still symptomatic. Today things are similar may be a little better. She is getting up and getting dressed every day. She is eating adequately and taking her medicine. Unfortunately she remains delusional with believes that people are calling her horrible names. She appears to probably be hallucinating. She is overwhelmed with inappropriate  guilt and confused in her thinking.  Assessment: Partial improvement. No active suicidal behavior. Safe to go home but requires further intensive treatment  Musculoskeletal: Strength & Muscle Tone: within normal limits Gait & Station: normal Patient leans: N/A  Psychiatric Specialty Exam: HPI  ROS  There were no vitals taken for this visit.There is no weight on file to calculate BMI.  General Appearance: Casual  Eye Contact:  Fair  Speech:  Garbled  Volume:  Decreased  Mood:  Anxious and Depressed  Affect:  Depressed and Inappropriate  Thought Process:  Disorganized and Irrelevant  Orientation:  Full (Time, Place, and Person)  Thought Content:  Delusions, Hallucinations: Auditory and Ideas of Reference:   Paranoia  Suicidal Thoughts:  No  Homicidal Thoughts:  No  Memory:  Immediate;   Fair Recent;   Fair Remote;   Fair  Judgement:  Impaired  Insight:  Shallow  Psychomotor Activity:  Decreased  Concentration:  Poor  Recall:  Poor  Fund of Knowledge: Fair  Language: Fair  Akathisia:  No  Handed:  Right  AIMS (if indicated):     Assets:  Communication Skills Desire for Improvement Resilience Social Support  ADL's:  Intact  Cognition: Impaired,  Mild  Sleep:  better   Is the patient at risk to self?  No. Has the patient been a risk to self in the past 6 months?  Yes.   Has the patient been  a risk to self within the distant past?  Yes.   Is the patient a risk to others?  No. Has the patient been a risk to others in the past 6 months?  No. Has the patient been a risk to others within the distant past?  No.  Current Medications: Current Outpatient Prescriptions  Medication Sig Dispense Refill  . acetaminophen-codeine (TYLENOL #3) 300-30 MG per tablet Take 1 tablet by mouth every 8 (eight) hours as needed for moderate pain or severe pain.    . hydrOXYzine (ATARAX/VISTARIL) 50 MG tablet Take 1 tablet (50 mg total) by mouth 3 (three) times daily as needed for anxiety. 30  tablet 0  . losartan (COZAAR) 25 MG tablet Take 1 tablet (25 mg total) by mouth daily. 30 tablet 0  . losartan-hydrochlorothiazide (HYZAAR) 50-12.5 MG per tablet TAKE 1 TABLET BY MOUTH ONCE DAILY.    . mirtazapine (REMERON) 45 MG tablet Take 45 mg by mouth at bedtime.    Marland Kitchen. OLANZapine (ZYPREXA) 20 MG tablet Take 20 mg by mouth at bedtime.    Marland Kitchen. omeprazole (PRILOSEC) 20 MG capsule Take 20 mg by mouth 2 (two) times daily before a meal.     . potassium chloride SA (K-DUR,KLOR-CON) 20 MEQ tablet Take 20 mEq by mouth daily.    . QUEtiapine (SEROQUEL) 300 MG tablet Take 300 mg by mouth at bedtime.    . sertraline (ZOLOFT) 100 MG tablet Take 200 mg by mouth daily. 2 tabs in the am    . simvastatin (ZOCOR) 40 MG tablet Take 40 mg by mouth at bedtime.      No current facility-administered medications for this visit.   Facility-Administered Medications Ordered in Other Visits  Medication Dose Route Frequency Provider Last Rate Last Dose  . dextrose 5 % solution 250 mL  250 mL Intravenous Once Audery AmelJohn T Clapacs, MD      . labetalol (NORMODYNE,TRANDATE) injection 20 mg  20 mg Intravenous Once Audery AmelJohn T Clapacs, MD      . labetalol (NORMODYNE,TRANDATE) injection 20 mg  20 mg Intravenous Once Audery AmelJohn T Clapacs, MD      . lactated ringers infusion   Intravenous Continuous Audery AmelJohn T Clapacs, MD      . methohexital Sodium 60 mg  60 mg Intravenous Once Audery AmelJohn T Clapacs, MD      . methohexital Sodium 60 mg  60 mg Intravenous Once Audery AmelJohn T Clapacs, MD      . succinylcholine (ANECTINE) injection 80 mg  80 mg Intravenous Once Audery AmelJohn T Clapacs, MD      . succinylcholine (ANECTINE) injection 80 mg  80 mg Intravenous Once Audery AmelJohn T Clapacs, MD        Medical Decision Making:  Review of Psycho-Social Stressors (1), Established Problem, Worsening (2), Review of Medication Regimen & Side Effects (2) and Review of New Medication or Change in Dosage (2)  Treatment Plan Summary:Medication management and Plan Patient will continue current  medication with Zyprexa and Seroquel and Remeron and Zoloft. She will have an ECT appointment tomorrow and I anticipate another one probably the following week. Lots of time doing some education and reassurance with the patient today. She and the husband are agreeable. No other change to treatment. She appears to be safe and Channing MuttersRoy appears to feel that she is safe going home at this time.   John Clapacs 10/06/2014, 3:28 PM

## 2014-10-07 ENCOUNTER — Encounter: Payer: Medicare Other | Admitting: Anesthesiology

## 2014-10-07 ENCOUNTER — Other Ambulatory Visit: Payer: Self-pay | Admitting: *Deleted

## 2014-10-07 ENCOUNTER — Encounter: Payer: Self-pay | Admitting: Anesthesiology

## 2014-10-07 ENCOUNTER — Encounter
Admission: RE | Admit: 2014-10-07 | Discharge: 2014-10-07 | Disposition: A | Discharge status: Home / Self Care | Payer: Medicare Other | Source: Ambulatory Visit | Attending: Psychiatry | Admitting: Psychiatry

## 2014-10-07 DIAGNOSIS — F333 Major depressive disorder, recurrent, severe with psychotic symptoms: Secondary | ICD-10-CM | POA: Diagnosis present

## 2014-10-07 MED ORDER — METHOHEXITAL SODIUM 100 MG/10ML IV SOSY
60.0000 mg | PREFILLED_SYRINGE | Freq: Once | INTRAVENOUS | Status: AC
  Administered 2014-10-07: 60 mg via INTRAVENOUS

## 2014-10-07 MED ORDER — SUCCINYLCHOLINE CHLORIDE 20 MG/ML IJ SOLN
80.0000 mg | Freq: Once | INTRAMUSCULAR | Status: AC
  Administered 2014-10-07: 80 mg via INTRAVENOUS

## 2014-10-07 MED ORDER — LABETALOL HCL 5 MG/ML IV SOLN
20.0000 mg | Freq: Once | INTRAVENOUS | Status: AC
  Administered 2014-10-07: 20 mg via INTRAVENOUS

## 2014-10-07 MED ORDER — KETOROLAC TROMETHAMINE 30 MG/ML IJ SOLN
30.0000 mg | Freq: Once | INTRAMUSCULAR | Status: AC
  Administered 2014-10-07: 30 mg via INTRAVENOUS

## 2014-10-07 MED ORDER — DEXTROSE 5 % IV SOLN
250.0000 mL | Freq: Once | INTRAVENOUS | Status: AC
  Administered 2014-10-07: 250 mL via INTRAVENOUS

## 2014-10-07 NOTE — Transfer of Care (Signed)
Immediate Anesthesia Transfer of Care Note  Patient: Jenna Dudley  Procedure(s) Performed: ect Patient Location: PACU  Anesthesia Type:General  Level of Consciousness: sedated  Airway & Oxygen Therapy: Patient Spontanous Breathing and Patient connected to face mask oxygen  Post-op Assessment: Report given to RN and Post -op Vital signs reviewed and stable  Post vital signs: Reviewed and stable   Last Vitals: 98.5 t 79 hr 96% sat 127/67 20resp Filed Vitals:   10/07/14 0940  BP: 150/66  Pulse: 87  Temp: 36.8 C  Resp: 20    Complications: No apparent anesthesia complications

## 2014-10-07 NOTE — Procedures (Signed)
ECT SERVICES Physician's Interval Evaluation & Treatment Note  Patient Identification: Jenna Dudley MRN:  [id number] Date of Evaluation:  10/07/2014 TX #: 133  MADRS: 25  MMSE: 20  P.E. Findings:  Physical exam stable. Mild back pain. No new findings.  Psychiatric Interval Note:  Patient continues to be depressed anxious and has psychotic thinking but is eating and functioning better than prior to last discharge  Subjective:  Patient is a 79 y.o. female seen for evaluation for Electroconvulsive Therapy. Patient continues to have multiple psychiatric complaints but physically he is doing well  Treatment Summary:   [x]   Right Unilateral             []  Bilateral   % Energy : 0.3 ms 100%   Impedance: 1290 ohms  Seizure Energy Index: 2658 V squared  Postictal Suppression Index: 75%  Seizure Concordance Index: 78%  Medications  Pre Shock: Toradol 30 mg, labetalol 20 mg, Brevital 60 mg, succinylcholine 80 mg  Post Shock:    Seizure Duration: 18 seconds by EMG 51 seconds by EEG   Comments: Follow-up on Friday to try to get further resolution of symptoms   Lungs:  [x]   Clear to auscultation               []  Other:   Heart:    [x]   Regular rhythm             []  irregular rhythm    [x]   Previous H&P reviewed, patient examined and there are NO CHANGES                 []   Previous H&P reviewed, patient examined and there are changes noted.   Mordecai RasmussenJohn Norabelle Kondo, MD 7/13/201611:07 AM

## 2014-10-07 NOTE — Anesthesia Postprocedure Evaluation (Signed)
  Anesthesia Post-op Note  Patient: Jenna Dudley  Procedure(s) Performed: * No procedures listed *  Anesthesia type:General  Patient location: PACU  Post pain: Pain level controlled  Post assessment: Post-op Vital signs reviewed, Patient's Cardiovascular Status Stable, Respiratory Function Stable, Patent Airway and No signs of Nausea or vomiting  Post vital signs: Reviewed and stable  Last Vitals:  Filed Vitals:   10/07/14 1130  BP:   Pulse:   Temp: 36.8 C  Resp:     Level of consciousness: awake, alert  and patient cooperative  Complications: No apparent anesthesia complications

## 2014-10-07 NOTE — Anesthesia Preprocedure Evaluation (Signed)
Anesthesia Evaluation  Patient identified by MRN, date of birth, ID band  Reviewed: Allergy & Precautions, H&P , NPO status , Patient's Chart, lab work & pertinent test results  History of Anesthesia Complications Negative for: history of anesthetic complications  Airway Mallampati: II       Dental  (+) Edentulous Upper, Edentulous Lower   Pulmonary former smoker,  + rhonchi         Cardiovascular hypertension, Pt. on home beta blockers Normal cardiovascular exam    Neuro/Psych PSYCHIATRIC DISORDERS Depression  Neuromuscular disease    GI/Hepatic Neg liver ROS, GERD-  Controlled,  Endo/Other    Renal/GU negative Renal ROS  negative genitourinary   Musculoskeletal  (+) Arthritis -,   Abdominal (+) + obese,   Peds negative pediatric ROS (+)  Hematology   Anesthesia Other Findings Past Medical History:   Hypertension                                                 Hyperlipemia                                    07/29/14       Chronic low back pain                           07/29/14       Spinal stenosis                                 07/29/14       GERD (gastroesophageal reflux disease)                       Depression                                                   Esophageal spasm                                07/29/14       Bilateral carpal tunnel syndrome                07/27/14       Degenerative arthritis of lumbar spine          07/27/14       Reproductive/Obstetrics negative OB ROS                             Anesthesia Physical  Anesthesia Plan  ASA: III  Anesthesia Plan: General   Post-op Pain Management:    Induction: Intravenous  Airway Management Planned: Mask  Additional Equipment:   Intra-op Plan:   Post-operative Plan:   Informed Consent: I have reviewed the patients History and Physical, chart, labs and discussed the procedure including the risks, benefits and  alternatives for the proposed anesthesia with the patient or authorized representative who has indicated his/her understanding and acceptance.     Plan Discussed  with: CRNA  Anesthesia Plan Comments:         Anesthesia Quick Evaluation

## 2014-10-09 ENCOUNTER — Encounter: Payer: Self-pay | Admitting: Anesthesiology

## 2014-10-09 ENCOUNTER — Encounter
Admission: RE | Admit: 2014-10-09 | Discharge: 2014-10-09 | Disposition: A | Discharge status: Home / Self Care | Payer: Medicare Other | Source: Ambulatory Visit | Attending: Psychiatry | Admitting: Psychiatry

## 2014-10-09 ENCOUNTER — Other Ambulatory Visit: Payer: Self-pay

## 2014-10-09 DIAGNOSIS — F333 Major depressive disorder, recurrent, severe with psychotic symptoms: Secondary | ICD-10-CM | POA: Insufficient documentation

## 2014-10-09 MED ORDER — KETOROLAC TROMETHAMINE 30 MG/ML IJ SOLN
30.0000 mg | Freq: Once | INTRAMUSCULAR | Status: AC
  Administered 2014-10-09: 30 mg via INTRAVENOUS

## 2014-10-09 MED ORDER — DEXTROSE 5 % IV SOLN
250.0000 mL | Freq: Once | INTRAVENOUS | Status: AC
  Administered 2014-10-09: 250 mL via INTRAVENOUS

## 2014-10-09 MED ORDER — METHOHEXITAL SODIUM 100 MG/10ML IV SOSY
60.0000 mg | PREFILLED_SYRINGE | Freq: Once | INTRAVENOUS | Status: AC
  Administered 2014-10-09: 60 mg via INTRAVENOUS

## 2014-10-09 MED ORDER — LABETALOL HCL 5 MG/ML IV SOLN
20.0000 mg | Freq: Once | INTRAVENOUS | Status: AC
  Administered 2014-10-09: 20 mg via INTRAVENOUS

## 2014-10-09 MED ORDER — ONDANSETRON HCL 4 MG/2ML IJ SOLN: 4.0000 mg | Freq: Once | INTRAMUSCULAR | Status: AC | PRN

## 2014-10-09 MED ORDER — FENTANYL CITRATE (PF) 100 MCG/2ML IJ SOLN: 25.0000 ug | INTRAMUSCULAR | Status: DC | PRN

## 2014-10-09 MED ORDER — SUCCINYLCHOLINE CHLORIDE 20 MG/ML IJ SOLN
80.0000 mg | Freq: Once | INTRAMUSCULAR | Status: AC
  Administered 2014-10-09: 80 mg via INTRAVENOUS

## 2014-10-09 NOTE — Anesthesia Preprocedure Evaluation (Signed)
Anesthesia Evaluation  Patient identified by MRN, date of birth, ID band  Reviewed: Allergy & Precautions, NPO status , Patient's Chart, lab work & pertinent test results  History of Anesthesia Complications Negative for: history of anesthetic complications  Airway Mallampati: II       Dental  (+) Edentulous Upper, Edentulous Lower   Pulmonary former smoker,  + rhonchi         Cardiovascular hypertension, Pt. on home beta blockers Normal cardiovascular exam    Neuro/Psych Depression    GI/Hepatic Neg liver ROS,   Endo/Other    Renal/GU negative Renal ROS  negative genitourinary   Musculoskeletal  (+) Arthritis -,   Abdominal (+) + obese,   Peds negative pediatric ROS (+)  Hematology   Anesthesia Other Findings   Reproductive/Obstetrics negative OB ROS                             Anesthesia Physical  Anesthesia Plan  ASA: III  Anesthesia Plan: General   Post-op Pain Management:    Induction: Intravenous  Airway Management Planned: Mask  Additional Equipment:   Intra-op Plan:   Post-operative Plan:   Informed Consent: I have reviewed the patients History and Physical, chart, labs and discussed the procedure including the risks, benefits and alternatives for the proposed anesthesia with the patient or authorized representative who has indicated his/her understanding and acceptance.     Plan Discussed with: CRNA  Anesthesia Plan Comments:         Anesthesia Quick Evaluation

## 2014-10-09 NOTE — Anesthesia Postprocedure Evaluation (Signed)
  Anesthesia Post-op Note  Patient: Jenna Dudley  Procedure(s) Performed: * No procedures listed *  Anesthesia type:General  Patient location: PACU  Post pain: Pain level controlled  Post assessment: Post-op Vital signs reviewed, Patient's Cardiovascular Status Stable, Respiratory Function Stable, Patent Airway and No signs of Nausea or vomiting  Post vital signs: Reviewed and stable  Last Vitals:  Filed Vitals:   10/09/14 1300  BP: 155/60  Pulse: 78  Temp:   Resp: 18    Level of consciousness: awake, alert  and patient cooperative  Complications: No apparent anesthesia complications

## 2014-10-09 NOTE — Transfer of Care (Signed)
Immediate Anesthesia Transfer of Care Note  Patient: Jenna Dudley  Procedure(s) Performed: ECT  Patient Location: PACU  Anesthesia Type:General  Level of Consciousness: sedated  Airway & Oxygen Therapy: Patient Spontanous Breathing and Patient connected to face mask oxygen  Post-op Assessment: Report given to RN  Post vital signs: Reviewed and stable  Last Vitals:  Filed Vitals:   10/09/14 1156  BP: 129/71  Pulse: 78  Temp: 37.6 C  Resp: 18    Complications: No apparent anesthesia complications

## 2014-10-09 NOTE — Procedures (Signed)
ECT SERVICES Physician's Interval Evaluation & Treatment Note  Patient Identification: [name] MRN:  [id number] Date of Evaluation:  10/09/2014 TX #: 134  MADRS:   MMSE:   P.E. Findings:  No new physical findings  Psychiatric Interval Note:  Much closer to normal. Still overly apologetic. Able to have more full range affect.  Subjective:  Patient is a 79 y.o. female seen for evaluation for Electroconvulsive Therapy. She recognizes to some degree she has improvement  Treatment Summary:   [x]   Right Unilateral             []  Bilateral   % Energy : 0.3 ms 100%   Impedance: 1060 ohms  Seizure Energy Index: 2473 V squared  Postictal Suppression Index: 72%  Seizure Concordance Index: 80%  Medications  Pre Shock: Toradol 30 mg, labetalol 20 mg, Brevital 60 mg, succinylcholine 80 mg  Post Shock: None  Seizure Duration: 12 seconds by EMG, 50 seconds by EEG   Comments: Follow-up on Wednesday   Lungs:  [x]   Clear to auscultation               []  Other:   Heart:    [x]   Regular rhythm             []  irregular rhythm    [x]   Previous H&P reviewed, patient examined and there are NO CHANGES                 []   Previous H&P reviewed, patient examined and there are changes noted.   Mordecai RasmussenJohn Clapacs, MD 7/15/201611:50 AM

## 2014-10-09 NOTE — Anesthesia Procedure Notes (Signed)
Date/Time: 10/09/2014 11:43 AM Performed by: Lily KocherPERALTA, Jenna Petrilla Pre-anesthesia Checklist: Patient identified and Timeout performed Patient Re-evaluated:Patient Re-evaluated prior to inductionOxygen Delivery Method: Circle system utilized Preoxygenation: Pre-oxygenation with 100% oxygen Intubation Type: IV induction Ventilation: Mask ventilation without difficulty and Mask ventilation throughout procedure Airway Equipment and Method: Bite block Placement Confirmation: positive ETCO2 Dental Injury: Teeth and Oropharynx as per pre-operative assessment

## 2014-10-13 DIAGNOSIS — F333 Major depressive disorder, recurrent, severe with psychotic symptoms: Secondary | ICD-10-CM | POA: Insufficient documentation

## 2014-10-14 ENCOUNTER — Encounter: Payer: Self-pay | Admitting: Anesthesiology

## 2014-10-14 ENCOUNTER — Ambulatory Visit
Admission: RE | Admit: 2014-10-14 | Discharge: 2014-10-14 | Disposition: A | Discharge status: Home / Self Care | Payer: Medicare Other | Source: Ambulatory Visit | Attending: Psychiatry | Admitting: Psychiatry

## 2014-10-14 DIAGNOSIS — F333 Major depressive disorder, recurrent, severe with psychotic symptoms: Secondary | ICD-10-CM | POA: Insufficient documentation

## 2014-10-14 MED ORDER — SUCCINYLCHOLINE CHLORIDE 20 MG/ML IJ SOLN
80.0000 mg | Freq: Once | INTRAMUSCULAR | Status: AC
  Administered 2014-10-14: 80 mg via INTRAVENOUS

## 2014-10-14 MED ORDER — DEXTROSE 5 % IV SOLN
250.0000 mL | Freq: Once | INTRAVENOUS | Status: AC
  Administered 2014-10-14: 250 mL via INTRAVENOUS

## 2014-10-14 MED ORDER — LABETALOL HCL 5 MG/ML IV SOLN
20.0000 mg | Freq: Once | INTRAVENOUS | Status: AC
  Administered 2014-10-14: 20 mg via INTRAVENOUS

## 2014-10-14 MED ORDER — METHOHEXITAL SODIUM 100 MG/10ML IV SOSY
60.0000 mg | PREFILLED_SYRINGE | Freq: Once | INTRAVENOUS | Status: AC
  Administered 2014-10-14: 60 mg via INTRAVENOUS

## 2014-10-14 MED ORDER — KETOROLAC TROMETHAMINE 30 MG/ML IJ SOLN
30.0000 mg | Freq: Once | INTRAMUSCULAR | Status: AC
  Administered 2014-10-14: 30 mg via INTRAVENOUS

## 2014-10-14 NOTE — Transfer of Care (Signed)
Immediate Anesthesia Transfer of Care Note  Patient: Jenna DaftJoan M Sorenson  Procedure(s) Performed: ECT  Patient Location: PACU  Anesthesia Type:General  Level of Consciousness: awake  Airway & Oxygen Therapy: Patient Spontanous Breathing and Patient connected to face mask oxygen  Post-op Assessment: Report given to RN and Post -op Vital signs reviewed and stable  Post vital signs: Reviewed and stable  Last Vitals:  Filed Vitals:   10/14/14 1018  BP: 131/59  Pulse: 75  Temp: 37.4 C  Resp: 15    Complications: No apparent anesthesia complications

## 2014-10-14 NOTE — Procedures (Signed)
ECT SERVICES Physician's Interval Evaluation & Treatment Note  Patient Identification: Jenna Dudley MRN:  [id number] Date of Evaluation:  10/14/2014 TX #: 135  MADRS:   MMSE:   P.E. Findings: No new findings. Vital signs stable. Back pain under good control.   Psychiatric Interval Note:  Mood is improved. Thinking significantly more clear. Able to carry on rational conversations. No evidence of acute delusions. Taking care of herself and working around the house appropriately. More optimistic. No sign of delirium.  Subjective:  Patient is a 79 y.o. female seen for evaluation for Electroconvulsive Therapy. Patient is nearly back to baseline.  Treatment Summary:   [x]   Right Unilateral             []  Bilateral   % Energy : 0.3 ms 100%   Impedance: 150 ohms  Seizure Energy Index: 3436 V squared  Postictal Suppression Index: Less than 10%  Seizure Concordance Index: 81%  Medications  Pre Shock: Toradol 30 mg, labetalol 20 mg, Brevital 60 mg, succinylcholine 80 mg  Post Shock: None  Seizure Duration: 13 seconds by EMG, 28 seconds by EEG   Comments: Follow-up will be in 2 weeks. Patient and her husband are aware that if needed they can return for treatment earlier they just need to call me or the ECT office   Lungs:  [x]   Clear to auscultation               []  Other:   Heart:    [x]   Regular rhythm             []  irregular rhythm    [x]   Previous H&P reviewed, patient examined and there are NO CHANGES                 []   Previous H&P reviewed, patient examined and there are changes noted.   Mordecai RasmussenJohn Clapacs, MD 7/20/201610:00 AM

## 2014-10-14 NOTE — Anesthesia Postprocedure Evaluation (Signed)
  Anesthesia Post-op Note  Patient: Jenna Dudley  Procedure(s) Performed: * No procedures listed *  Anesthesia type:No value filed.  Patient location: PACU  Post pain: Pain level controlled  Post assessment: Post-op Vital signs reviewed, Patient's Cardiovascular Status Stable, Respiratory Function Stable, Patent Airway and No signs of Nausea or vomiting  Post vital signs: Reviewed and stable  Last Vitals:  Filed Vitals:   10/14/14 1102  BP: 100/74  Pulse: 74  Temp:   Resp: 18    Level of consciousness: awake, alert  and patient cooperative  Complications: No apparent anesthesia complications

## 2014-10-14 NOTE — Anesthesia Preprocedure Evaluation (Addendum)
Anesthesia Evaluation  Patient identified by MRN, date of birth, ID band  Reviewed: Allergy & Precautions, NPO status , Patient's Chart, lab work & pertinent test results  History of Anesthesia Complications Negative for: history of anesthetic complications  Airway Mallampati: II       Dental  (+) Edentulous Upper, Edentulous Lower   Pulmonary former smoker,  + rhonchi         Cardiovascular hypertension, Pt. on home beta blockers Normal cardiovascular exam    Neuro/Psych Depression    GI/Hepatic Neg liver ROS,   Endo/Other    Renal/GU negative Renal ROS  negative genitourinary   Musculoskeletal  (+) Arthritis -,   Abdominal (+) + obese,   Peds negative pediatric ROS (+)  Hematology   Anesthesia Other Findings   Reproductive/Obstetrics negative OB ROS                            Anesthesia Physical Anesthesia Plan  ASA: III  Anesthesia Plan: General   Post-op Pain Management:    Induction: Intravenous  Airway Management Planned: Mask  Additional Equipment:   Intra-op Plan:   Post-operative Plan:   Informed Consent: I have reviewed the patients History and Physical, chart, labs and discussed the procedure including the risks, benefits and alternatives for the proposed anesthesia with the patient or authorized representative who has indicated his/her understanding and acceptance.     Plan Discussed with: CRNA  Anesthesia Plan Comments:         Anesthesia Quick Evaluation                                  Anesthesia Evaluation  Patient identified by MRN, date of birth, ID band  Reviewed: Allergy & Precautions, NPO status , Patient's Chart, lab work & pertinent test results  History of Anesthesia Complications Negative for: history of anesthetic complications  Airway Mallampati: II       Dental  (+) Edentulous Upper, Edentulous Lower   Pulmonary former  smoker,  + rhonchi         Cardiovascular hypertension, Pt. on home beta blockers Normal cardiovascular exam    Neuro/Psych Depression    GI/Hepatic Neg liver ROS,   Endo/Other    Renal/GU negative Renal ROS  negative genitourinary   Musculoskeletal  (+) Arthritis -,   Abdominal (+) + obese,   Peds negative pediatric ROS (+)  Hematology   Anesthesia Other Findings   Reproductive/Obstetrics negative OB ROS                             Anesthesia Physical  Anesthesia Plan  ASA: III  Anesthesia Plan: General   Post-op Pain Management:    Induction: Intravenous  Airway Management Planned: Mask  Additional Equipment:   Intra-op Plan:   Post-operative Plan:   Informed Consent: I have reviewed the patients History and Physical, chart, labs and discussed the procedure including the risks, benefits and alternatives for the proposed anesthesia with the patient or authorized representative who has indicated his/her understanding and acceptance.     Plan Discussed with: CRNA  Anesthesia Plan Comments:         Anesthesia Quick Evaluation  Anesthesia Evaluation  Patient identified by MRN, date of birth, ID band  Reviewed: Allergy & Precautions, NPO status , Patient's Chart, lab work & pertinent test results  History of Anesthesia Complications Negative for: history of anesthetic complications  Airway Mallampati: II       Dental  (+) Edentulous Upper, Edentulous Lower   Pulmonary former smoker,  + rhonchi         Cardiovascular hypertension, Pt. on home beta blockers Normal cardiovascular exam    Neuro/Psych Depression    GI/Hepatic Neg liver ROS,   Endo/Other    Renal/GU negative Renal ROS  negative genitourinary   Musculoskeletal  (+) Arthritis -,   Abdominal (+) + obese,   Peds negative pediatric ROS (+)  Hematology   Anesthesia Other Findings    Reproductive/Obstetrics negative OB ROS                             Anesthesia Physical  Anesthesia Plan  ASA: III  Anesthesia Plan: General   Post-op Pain Management:    Induction: Intravenous  Airway Management Planned: Mask  Additional Equipment:   Intra-op Plan:   Post-operative Plan:   Informed Consent: I have reviewed the patients History and Physical, chart, labs and discussed the procedure including the risks, benefits and alternatives for the proposed anesthesia with the patient or authorized representative who has indicated his/her understanding and acceptance.     Plan Discussed with: CRNA  Anesthesia Plan Comments:         Anesthesia Quick Evaluation

## 2014-10-19 NOTE — Care Management Note (Incomplete)
Case Management Note  Patient Details  Name: Jenna Dudley MRN: [id number] Date of Birth: [birth year]  Subjective/Objective:                    Action/Plan:   Expected Discharge Date:                  Expected Discharge Plan:     In-House Referral:     Discharge planning Services     Post Acute Care Choice:    Choice offered to:     DME Arranged:    DME Agency:     HH Arranged:    HH Agency:     Status of Service:     Medicare Important Message Given:    Date Medicare IM Given:    Medicare IM give by:    Date Additional Medicare IM Given:    Additional Medicare Important Message give by:     If discussed at Long Length of Stay Meetings, dates discussed:    Additional Comments:  Georgena Spurling, RN 10/19/2014, 4:49 PM

## 2014-10-23 ENCOUNTER — Other Ambulatory Visit: Payer: Self-pay

## 2014-10-28 ENCOUNTER — Encounter: Payer: Medicare Other | Admitting: Certified Registered Nurse Anesthetist

## 2014-10-28 ENCOUNTER — Encounter: Payer: Self-pay | Admitting: Certified Registered Nurse Anesthetist

## 2014-10-28 ENCOUNTER — Encounter
Admission: RE | Admit: 2014-10-28 | Discharge: 2014-10-28 | Disposition: A | Discharge status: Home / Self Care | Payer: Medicare Other | Source: Ambulatory Visit | Attending: Psychiatry | Admitting: Psychiatry

## 2014-10-28 DIAGNOSIS — F333 Major depressive disorder, recurrent, severe with psychotic symptoms: Secondary | ICD-10-CM | POA: Diagnosis present

## 2014-10-28 MED ORDER — KETOROLAC TROMETHAMINE 30 MG/ML IJ SOLN
30.0000 mg | Freq: Once | INTRAMUSCULAR | Status: AC
  Administered 2014-10-28: 30 mg via INTRAVENOUS

## 2014-10-28 MED ORDER — SUCCINYLCHOLINE CHLORIDE 20 MG/ML IJ SOLN
80.0000 mg | Freq: Once | INTRAMUSCULAR | Status: AC
  Administered 2014-10-28: 80 mg via INTRAVENOUS

## 2014-10-28 MED ORDER — DEXTROSE 5 % IV SOLN
250.0000 mL | Freq: Once | INTRAVENOUS | Status: AC
  Administered 2014-10-28: 250 mL via INTRAVENOUS

## 2014-10-28 MED ORDER — DEXTROSE 5 % IV SOLN
INTRAVENOUS | Status: DC | PRN
  Administered 2014-10-28: 09:00:00 via INTRAVENOUS

## 2014-10-28 MED ORDER — METHOHEXITAL SODIUM 100 MG/10ML IV SOSY
60.0000 mg | PREFILLED_SYRINGE | Freq: Once | INTRAVENOUS | Status: AC
Start: 2014-10-28 — End: 2014-10-28
  Administered 2014-10-28: 60 mg via INTRAVENOUS

## 2014-10-28 MED ORDER — LABETALOL HCL 5 MG/ML IV SOLN
20.0000 mg | Freq: Once | INTRAVENOUS | Status: AC
Start: 2014-10-28 — End: 2014-10-28
  Administered 2014-10-28: 20 mg via INTRAVENOUS

## 2014-10-28 NOTE — Transfer of Care (Signed)
Immediate Anesthesia Transfer of Care Note  Patient: Jenna Dudley  Procedure(s) Performed: ECT Patient Location: PACU  Anesthesia Type:General  Level of Consciousness: awake and alert   Airway & Oxygen Therapy: Patient Spontanous Breathing and Patient connected to face mask oxygen  Post-op Assessment: Report given to RN and Post -op Vital signs reviewed and stable  Post vital signs: Reviewed and stable  Last Vitals:  Filed Vitals:   10/28/14 1112  BP: 125/78  Pulse:   Temp: 37.4 C  Resp:   Pulse 81 Resp 22  Complications: No apparent anesthesia complications

## 2014-10-28 NOTE — Anesthesia Preprocedure Evaluation (Signed)
Anesthesia Evaluation  Patient identified by MRN, date of birth, ID band  Reviewed: Allergy & Precautions, NPO status , Patient's Chart, lab work & pertinent test results  History of Anesthesia Complications Negative for: history of anesthetic complications  Airway Mallampati: II  TM Distance: >3 FB Neck ROM: Full    Dental  (+) Edentulous Upper, Edentulous Lower   Pulmonary former smoker,  + rhonchi         Cardiovascular hypertension, Pt. on home beta blockers and Pt. on medications Normal cardiovascular exam    Neuro/Psych Depression    GI/Hepatic Neg liver ROS, GERD-  Medicated and Controlled,  Endo/Other    Renal/GU negative Renal ROS  negative genitourinary   Musculoskeletal  (+) Arthritis -,   Abdominal (+) + obese,   Peds negative pediatric ROS (+)  Hematology   Anesthesia Other Findings   Reproductive/Obstetrics negative OB ROS                             Anesthesia Physical Anesthesia Plan  ASA: III  Anesthesia Plan: General   Post-op Pain Management:    Induction: Intravenous  Airway Management Planned: Mask  Additional Equipment:   Intra-op Plan:   Post-operative Plan:   Informed Consent: I have reviewed the patients History and Physical, chart, labs and discussed the procedure including the risks, benefits and alternatives for the proposed anesthesia with the patient or authorized representative who has indicated his/her understanding and acceptance.     Plan Discussed with:   Anesthesia Plan Comments:         Anesthesia Quick Evaluation

## 2014-10-28 NOTE — Anesthesia Postprocedure Evaluation (Signed)
  Anesthesia Post-op Note  Patient: Jenna Dudley  Procedure(s) Performed: ECT  Anesthesia type:General  Patient location: PACU  Post pain: Pain level controlled  Post assessment: Post-op Vital signs reviewed, Patient's Cardiovascular Status Stable, Respiratory Function Stable, Patent Airway and No signs of Nausea or vomiting  Post vital signs: Reviewed and stable  Last Vitals:  Filed Vitals:   10/28/14 1144  BP: 117/65  Pulse: 78  Temp:   Resp: 24    Level of consciousness: awake, alert  and patient cooperative  Complications: No apparent anesthesia complications

## 2014-10-28 NOTE — Procedures (Signed)
ECT SERVICES Physician's Interval Evaluation & Treatment Note  Patient Identification: MARQUITA LIAS MRN:  [id number] Date of Evaluation:  10/28/2014 TX #: 136  MADRS:   MMSE:   P.E. Findings:  Continues to have mild to moderate intermittent back pain. No other new complaints no change to physical exam  Psychiatric Interval Note:  Mood is near baseline. Still perhaps not quite 100%. Still a little bit overly anxious but functioning well and not psychotic  Subjective:  Patient is a 79 y.o. female seen for evaluation for Electroconvulsive Therapy. No new complaints  Treatment Summary:     Right Unilateral              Bilateral   % Energy : 0.3 ms 100%   Impedance: 1420 ohms  Seizure Energy Index: 2791 V squared  Postictal Suppression Index: 69%  Seizure Concordance Index: 88%  Medications  Pre Shock: Toradol 30 mg, labetalol 20 mg, Brevital 60 mg, succinylcholine 80 mg  Post Shock: None  Seizure Duration: 15 seconds by EMG, 47 seconds by EEG   Comments: Follow-up 2 weeks   Lungs:    Clear to auscultation                Other:   Heart:      Regular rhythm              irregular rhythm      Previous H&P reviewed, patient examined and there are NO CHANGES                   Previous H&P reviewed, patient examined and there are changes noted.   Mordecai Rasmussen, MD 8/3/201610:55 AM

## 2014-11-04 ENCOUNTER — Other Ambulatory Visit: Payer: Self-pay | Admitting: *Deleted

## 2014-11-09 ENCOUNTER — Telehealth (HOSPITAL_COMMUNITY): Payer: Self-pay | Admitting: *Deleted

## 2014-11-11 ENCOUNTER — Encounter: Payer: Self-pay | Admitting: Anesthesiology

## 2014-11-11 ENCOUNTER — Other Ambulatory Visit: Payer: Self-pay | Admitting: *Deleted

## 2014-11-11 ENCOUNTER — Encounter
Admission: RE | Admit: 2014-11-11 | Discharge: 2014-11-11 | Disposition: A | Discharge status: Home / Self Care | Payer: Medicare Other | Source: Ambulatory Visit | Attending: Psychiatry | Admitting: Psychiatry

## 2014-11-11 DIAGNOSIS — F332 Major depressive disorder, 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 features: Secondary | ICD-10-CM | POA: Diagnosis not present

## 2014-11-11 DIAGNOSIS — F333 Major depressive disorder, recurrent, severe with psychotic symptoms: Secondary | ICD-10-CM | POA: Diagnosis present

## 2014-11-11 MED ORDER — METHOHEXITAL SODIUM 100 MG/10ML IV SOSY
60.0000 mg | PREFILLED_SYRINGE | Freq: Once | INTRAVENOUS | Status: AC
  Administered 2014-11-11: 60 mg via INTRAVENOUS

## 2014-11-11 MED ORDER — SUCCINYLCHOLINE CHLORIDE 20 MG/ML IJ SOLN
80.0000 mg | Freq: Once | INTRAMUSCULAR | Status: AC
  Administered 2014-11-11: 80 mg via INTRAVENOUS

## 2014-11-11 MED ORDER — DEXTROSE 5 % IV SOLN
250.0000 mL | Freq: Once | INTRAVENOUS | Status: AC
  Administered 2014-12-09: 12:00:00 via INTRAVENOUS

## 2014-11-11 MED ORDER — KETOROLAC TROMETHAMINE 30 MG/ML IJ SOLN
30.0000 mg | Freq: Once | INTRAMUSCULAR | Status: AC
  Administered 2014-11-11: 30 mg via INTRAVENOUS

## 2014-11-11 MED ORDER — DEXTROSE 5 % IV SOLN
250.0000 mL | Freq: Once | INTRAVENOUS | Status: AC
  Administered 2014-11-11: 250 mL via INTRAVENOUS

## 2014-11-11 MED ORDER — LABETALOL HCL 5 MG/ML IV SOLN
20.0000 mg | Freq: Once | INTRAVENOUS | Status: AC
  Administered 2014-11-11: 20 mg via INTRAVENOUS

## 2014-11-11 NOTE — Anesthesia Preprocedure Evaluation (Signed)
Anesthesia Evaluation  Patient identified by MRN, date of birth, ID band  Reviewed: Allergy & Precautions, NPO status , Patient's Chart, lab work & pertinent test results  History of Anesthesia Complications Negative for: history of anesthetic complications  Airway Mallampati: II  TM Distance: >3 FB Neck ROM: Full    Dental  (+) Edentulous Upper, Edentulous Lower   Pulmonary former smoker,  + rhonchi         Cardiovascular hypertension, Pt. on home beta blockers and Pt. on medications Normal cardiovascular exam    Neuro/Psych    GI/Hepatic Neg liver ROS, GERD-  Medicated and Controlled,  Endo/Other    Renal/GU negative Renal ROS  negative genitourinary   Musculoskeletal  (+) Arthritis -,   Abdominal (+) + obese,   Peds negative pediatric ROS (+)  Hematology   Anesthesia Other Findings Past Medical History:   Hypertension                                                 Hyperlipemia                                    07/29/14       Chronic low back pain                           07/29/14       Spinal stenosis                                 07/29/14       GERD (gastroesophageal reflux disease)                       Depression                                                   Esophageal spasm                                07/29/14       Bilateral carpal tunnel syndrome                07/27/14       Degenerative arthritis of lumbar spine          07/27/14       Reproductive/Obstetrics negative OB ROS                             Anesthesia Physical  Anesthesia Plan  ASA: III  Anesthesia Plan: General   Post-op Pain Management:    Induction: Intravenous  Airway Management Planned: Mask  Additional Equipment:   Intra-op Plan:   Post-operative Plan:   Informed Consent: I have reviewed the patients History and Physical, chart, labs and discussed the procedure including the risks, benefits  and alternatives for the proposed anesthesia with the patient or authorized representative who has indicated his/her understanding and acceptance.  Plan Discussed with:   Anesthesia Plan Comments:         Anesthesia Quick Evaluation

## 2014-11-11 NOTE — Anesthesia Postprocedure Evaluation (Signed)
  Anesthesia Post-op Note  Patient: Jenna Dudley  Procedure(s) Performed: * No procedures listed *  Anesthesia type:General  Patient location: PACU  Post pain: Pain level controlled  Post assessment: Post-op Vital signs reviewed, Patient's Cardiovascular Status Stable, Respiratory Function Stable, Patent Airway and No signs of Nausea or vomiting  Post vital signs: Reviewed and stable  Last Vitals:  Filed Vitals:   11/11/14 1244  BP: 123/53  Pulse: 77  Temp:   Resp: 20    Level of consciousness: awake, alert  and patient cooperative  Complications: No apparent anesthesia complications

## 2014-11-11 NOTE — Procedures (Signed)
ECT SERVICES Physician's Interval Evaluation & Treatment Note  Patient Identification: Jenna Dudley MRN:  [id number] Date of Evaluation:  11/11/2014 TX #: 137  MADRS:   MMSE:   P.E. Findings:  No change physically. Back pain stable  Psychiatric Interval Note:  Mood has been stable doing well seems to be back to baseline  Subjective:  Patient is a 79 y.o. female seen for evaluation for Electroconvulsive Therapy. Patient has no new complaints  Treatment Summary:     Right Unilateral              Bilateral   % Energy : 0.3 ms 100%   Impedance: 1330 ohms  Seizure Energy Index: 3083 V squared  Postictal Suppression Index: 54%  Seizure Concordance Index: 79%  Medications  Pre Shock: Toradol 30 mg, labetalol 20 mg, Brevital 60 mg, succinylcholine 80 mg  Post Shock: None  Seizure Duration: 19 seconds by EMG, 47 seconds by EEG   Comments: Follow-up 2 weeks   Lungs:    Clear to auscultation                Other:   Heart:      Regular rhythm              irregular rhythm      Previous H&P reviewed, patient examined and there are NO CHANGES                   Previous H&P reviewed, patient examined and there are changes noted.   Mordecai Rasmussen, MD 8/17/201611:44 AM

## 2014-11-11 NOTE — Transfer of Care (Signed)
Immediate Anesthesia Transfer of Care Note  Patient: Jenna Dudley  Procedure(s) Performed: ECT  Patient Location: PACU  Anesthesia Type:General  Level of Consciousness: sedated  Airway & Oxygen Therapy: Patient Spontanous Breathing and Patient connected to face mask oxygen  Post-op Assessment: Report given to RN  Post vital signs: Reviewed and stable  Last Vitals:  Filed Vitals:   11/11/14 1200  BP: 149/75  Temp: 37.6 C  Resp: 80    Complications: No apparent anesthesia complications

## 2014-11-11 NOTE — Anesthesia Procedure Notes (Signed)
Date/Time: 11/11/2014 11:48 AM Performed by: Lily Kocher Pre-anesthesia Checklist: Patient identified, Timeout performed, Emergency Drugs available, Suction available and Patient being monitored Patient Re-evaluated:Patient Re-evaluated prior to inductionOxygen Delivery Method: Circle system utilized Preoxygenation: Pre-oxygenation with 100% oxygen Intubation Type: IV induction Ventilation: Mask ventilation without difficulty and Mask ventilation throughout procedure Airway Equipment and Method: Bite block Placement Confirmation: positive ETCO2 Dental Injury: Teeth and Oropharynx as per pre-operative assessment

## 2014-11-23 ENCOUNTER — Other Ambulatory Visit: Payer: Self-pay

## 2014-11-25 ENCOUNTER — Ambulatory Visit
Admission: RE | Admit: 2014-11-25 | Discharge: 2014-11-25 | Disposition: A | Discharge status: Home / Self Care | Payer: Medicare Other | Source: Ambulatory Visit | Attending: Psychiatry | Admitting: Psychiatry

## 2014-11-25 ENCOUNTER — Encounter: Payer: Self-pay | Admitting: *Deleted

## 2014-11-25 DIAGNOSIS — F333 Major depressive disorder, recurrent, severe with psychotic symptoms: Secondary | ICD-10-CM | POA: Diagnosis present

## 2014-11-25 MED ORDER — SUCCINYLCHOLINE CHLORIDE 20 MG/ML IJ SOLN
80.0000 mg | Freq: Once | INTRAMUSCULAR | Status: AC
  Administered 2014-11-25: 80 mg via INTRAVENOUS

## 2014-11-25 MED ORDER — METHOHEXITAL SODIUM 0.5 G IJ SOLR
60.0000 mg | Freq: Once | INTRAMUSCULAR | Status: AC
  Administered 2014-11-25: 60 mg via INTRAVENOUS

## 2014-11-25 MED ORDER — METHOHEXITAL SODIUM 100 MG/10ML IV SOSY: 60.0000 mg | PREFILLED_SYRINGE | Freq: Once | INTRAVENOUS | Status: DC

## 2014-11-25 MED ORDER — LABETALOL HCL 5 MG/ML IV SOLN: 20.0000 mg | Freq: Once | INTRAVENOUS | Status: DC

## 2014-11-25 MED ORDER — KETOROLAC TROMETHAMINE 30 MG/ML IJ SOLN
30.0000 mg | Freq: Once | INTRAMUSCULAR | Status: AC
  Administered 2014-11-25: 30 mg via INTRAVENOUS

## 2014-11-25 MED ORDER — SUCCINYLCHOLINE CHLORIDE 20 MG/ML IJ SOLN: 80.0000 mg | Freq: Once | INTRAMUSCULAR | Status: DC

## 2014-11-25 MED ORDER — DEXAMETHASONE SODIUM PHOSPHATE 10 MG/ML IJ SOLN: 8.0000 mg | Freq: Once | INTRAMUSCULAR | Status: DC | PRN

## 2014-11-25 MED ORDER — LABETALOL HCL 5 MG/ML IV SOLN
20.0000 mg | Freq: Once | INTRAVENOUS | Status: AC
  Administered 2014-11-25: 20 mg via INTRAVENOUS

## 2014-11-25 MED ORDER — DEXTROSE 5 % IV SOLN
250.0000 mL | Freq: Once | INTRAVENOUS | Status: AC
  Administered 2014-11-25: 250 mL via INTRAVENOUS

## 2014-11-25 NOTE — Transfer of Care (Signed)
Immediate Anesthesia Transfer of Care Note  Patient: Jenna Dudley  Procedure(s) Performed: ECT  Patient Location: PACU  Anesthesia Type:General  Level of Consciousness: sedated  Airway & Oxygen Therapy: Patient Spontanous Breathing and Patient connected to face mask oxygen  Post-op Assessment: Report given to RN  Post vital signs: Reviewed  Last Vitals:  Filed Vitals:   11/25/14 0932  BP: 148/64  Pulse: 87  Temp: 36.8 C  Resp: 18    Complications: No apparent anesthesia complications

## 2014-11-25 NOTE — Anesthesia Postprocedure Evaluation (Signed)
  Anesthesia Post-op Note  Patient: Jenna Dudley  Procedure(s) Performed: * No procedures listed *  Anesthesia type:General  Patient location: PACU  Post pain: Pain level controlled  Post assessment: Post-op Vital signs reviewed, Patient's Cardiovascular Status Stable, Respiratory Function Stable, Patent Airway and No signs of Nausea or vomiting  Post vital signs: Reviewed and stable  Last Vitals:  Filed Vitals:   11/25/14 0932  BP: 148/64  Pulse: 87  Temp: 36.8 C  Resp: 18    Level of consciousness: awake, alert  and patient cooperative  Complications: No apparent anesthesia complications

## 2014-11-25 NOTE — Procedures (Signed)
ECT SERVICES Physician's Interval Evaluation & Treatment Note  Patient Identification: Jenna Dudley MRN:  [id number] Date of Evaluation:  11/25/2014 TX #: 138  MADRS:   MMSE:   P.E. Findings:  No change to physical exam  Psychiatric Interval Note:  Mood is upbeat cognition good baseline is back to normal   Subjective:  Patient is a 79 y.o. female seen for evaluation for Electroconvulsive Therapy. Chronic back pain no different than usual  Treatment Summary:     Right Unilateral              Bilateral   % Energy : 0.3 ms 100%   Impedance: 1510 ohms  Seizure Energy Index: 3591 V squared  Postictal Suppression Index: 90%  Seizure Concordance Index: 87%  Medications  Pre Shock: Labetalol 20 mg, Toradol 30 mg, Brevital 60 mg, succinylcholine 80 mg  Post Shock: None  Seizure Duration: 18 seconds by EMG, 46 seconds by EEG   Comments: Follow-up in 2 weeks on September 14   Lungs:    Clear to auscultation                Other:   Heart:      Regular rhythm              irregular rhythm      Previous H&P reviewed, patient examined and there are NO CHANGES                   Previous H&P reviewed, patient examined and there are changes noted.   Mordecai Rasmussen, MD 8/31/201611:56 AM

## 2014-11-25 NOTE — Anesthesia Procedure Notes (Signed)
Date/Time: 11/25/2014 12:06 PM Performed by: Lily Kocher Pre-anesthesia Checklist: Patient identified, Emergency Drugs available, Suction available and Patient being monitored Patient Re-evaluated:Patient Re-evaluated prior to inductionOxygen Delivery Method: Circle system utilized Preoxygenation: Pre-oxygenation with 100% oxygen Intubation Type: IV induction Ventilation: Mask ventilation without difficulty and Mask ventilation throughout procedure Airway Equipment and Method: Bite block Placement Confirmation: positive ETCO2 Dental Injury: Teeth and Oropharynx as per pre-operative assessment

## 2014-11-25 NOTE — Anesthesia Preprocedure Evaluation (Addendum)
Anesthesia Evaluation  Patient identified by MRN, date of birth, ID band Patient awake    Reviewed: Allergy & Precautions, NPO status   Airway Mallampati: II  TM Distance: >3 FB Neck ROM: Limited    Dental  (+) Edentulous Upper   Pulmonary former smoker,    Pulmonary exam normal       Cardiovascular Exercise Tolerance: Poor hypertension, Pt. on medications and Pt. on home beta blockers Normal cardiovascular exam    Neuro/Psych    GI/Hepatic GERD-  Medicated and Controlled,  Endo/Other    Renal/GU      Musculoskeletal   Abdominal (+) + obese,  Abdomen: soft.    Peds  Hematology   Anesthesia Other Findings   Reproductive/Obstetrics                            Anesthesia Physical Anesthesia Plan  ASA: III  Anesthesia Plan: General   Post-op Pain Management:    Induction: Intravenous  Airway Management Planned: Mask  Additional Equipment:   Intra-op Plan:   Post-operative Plan:   Informed Consent: I have reviewed the patients History and Physical, chart, labs and discussed the procedure including the risks, benefits and alternatives for the proposed anesthesia with the patient or authorized representative who has indicated his/her understanding and acceptance.     Plan Discussed with: CRNA  Anesthesia Plan Comments:         Anesthesia Quick Evaluation

## 2014-12-02 NOTE — Addendum Note (Signed)
Encounter addended by: Rosalita Chessman, RN on: 12/02/2014  4:31 PM<BR>     Documentation filed: PRL Based Order Sets, Orders

## 2014-12-02 NOTE — Addendum Note (Signed)
Encounter addended by: Rosalita Chessman, RN on: 12/02/2014  4:29 PM<BR>     Documentation filed: PRL Based Order Sets, Orders

## 2014-12-07 ENCOUNTER — Other Ambulatory Visit: Payer: Self-pay

## 2014-12-07 NOTE — Addendum Note (Signed)
Encounter addended by: Rosalita Chessman, RN on: 12/07/2014  5:58 PM<BR>     Documentation filed: PRL Based Order Sets, Orders

## 2014-12-07 NOTE — Addendum Note (Signed)
Encounter addended by: Rosalita Chessman, RN on: 12/07/2014  5:29 PM<BR>     Documentation filed: PRL Based Order Sets, Orders

## 2014-12-08 NOTE — Addendum Note (Signed)
Encounter addended by: Rosalita Chessman, RN on: 12/08/2014  9:53 PM<BR>     Documentation filed: PRL Based Order Sets, Orders

## 2014-12-09 ENCOUNTER — Encounter: Payer: Medicare Other | Admitting: Anesthesiology

## 2014-12-09 ENCOUNTER — Ambulatory Visit
Admission: RE | Admit: 2014-12-09 | Discharge: 2014-12-09 | Disposition: A | Discharge status: Home / Self Care | Payer: Medicare Other | Source: Ambulatory Visit | Attending: Psychiatry | Admitting: Psychiatry

## 2014-12-09 ENCOUNTER — Encounter: Payer: Self-pay | Admitting: Anesthesiology

## 2014-12-09 DIAGNOSIS — F333 Major depressive disorder, recurrent, severe with psychotic symptoms: Secondary | ICD-10-CM

## 2014-12-09 DIAGNOSIS — Z79899 Other long term (current) drug therapy: Secondary | ICD-10-CM | POA: Insufficient documentation

## 2014-12-09 DIAGNOSIS — Z9889 Other specified postprocedural states: Secondary | ICD-10-CM | POA: Insufficient documentation

## 2014-12-09 DIAGNOSIS — K219 Gastro-esophageal reflux disease without esophagitis: Secondary | ICD-10-CM | POA: Insufficient documentation

## 2014-12-09 DIAGNOSIS — F339 Major depressive disorder, recurrent, unspecified: Secondary | ICD-10-CM | POA: Insufficient documentation

## 2014-12-09 MED ORDER — KETOROLAC TROMETHAMINE 30 MG/ML IJ SOLN
30.0000 mg | Freq: Once | INTRAMUSCULAR | Status: AC
  Administered 2014-12-09: 30 mg via INTRAVENOUS

## 2014-12-09 MED ORDER — SUCCINYLCHOLINE CHLORIDE 20 MG/ML IJ SOLN
80.0000 mg | Freq: Once | INTRAMUSCULAR | Status: AC
  Administered 2014-12-09: 80 mg via INTRAVENOUS

## 2014-12-09 MED ORDER — LABETALOL HCL 5 MG/ML IV SOLN
20.0000 mg | Freq: Once | INTRAVENOUS | Status: AC
  Administered 2014-12-09: 20 mg via INTRAVENOUS

## 2014-12-09 MED ORDER — DEXTROSE 5 % IV SOLN
250.0000 mL | Freq: Once | INTRAVENOUS | Status: AC
  Administered 2014-12-09: 250 mL via INTRAVENOUS

## 2014-12-09 MED ORDER — METHOHEXITAL SODIUM 100 MG/10ML IV SOSY
60.0000 mg | PREFILLED_SYRINGE | Freq: Once | INTRAVENOUS | Status: AC
  Administered 2014-12-09: 60 mg via INTRAVENOUS

## 2014-12-09 NOTE — Progress Notes (Signed)
Having a hard time remembering year speech garbled  Reorienting pt to year

## 2014-12-09 NOTE — Procedures (Addendum)
ECT SERVICES Physician's Interval Evaluation & Treatment Note  Patient Identification: Jenna Dudley MRN:  [id number] Date of Evaluation:  12/09/2014 TX #: 139  MADRS:   MMSE:   P.E. Findings:  No new physical findings. Chronic back pain  Psychiatric Interval Note:  Mood is stable no return of psychotic symptoms  Subjective:  Patient is a 79 y.o. female seen for evaluation for Electroconvulsive Therapy. No new complaints  Treatment Summary:     Right Unilateral              Bilateral   % Energy : 0.3 ms 100%   Impedance: 1590 ohms  Seizure Energy Index: 3207 V squared  Postictal Suppression Index: 92%  Seizure Concordance Index: 84%  Medications  Pre Shock: Labetalol 20 mg, Toradol 30 mg, Brevital 60 mg, succinylcholine 80 mg  Post Shock: None  Seizure Duration: 19 seconds by EMG, 50 seconds by EEG   Comments: Patient is doing well. 2 week maintenance routine is holding steady. Next treatment in 2 weeks on September 28   Lungs:    Clear to auscultation                Other:   Heart:      Regular rhythm              irregular rhythm      Previous H&P reviewed, patient examined and there are NO CHANGES                   Previous H&P reviewed, patient examined and there are changes noted.   Mordecai Rasmussen, MD 9/14/201612:28 PM

## 2014-12-09 NOTE — Anesthesia Postprocedure Evaluation (Signed)
  Anesthesia Post-op Note  Patient: Jenna Dudley  Procedure(s) Performed: * No procedures listed *  Anesthesia type:General  Patient location: PACU  Post pain: Pain level controlled  Post assessment: Post-op Vital signs reviewed, Patient's Cardiovascular Status Stable, Respiratory Function Stable, Patent Airway and No signs of Nausea or vomiting  Post vital signs: Reviewed and stable  Last Vitals:  Filed Vitals:   12/09/14 1315  BP: 129/55  Pulse: 78  Temp:   Resp: 18    Level of consciousness: awake, alert  and patient cooperative  Complications: No apparent anesthesia complications

## 2014-12-09 NOTE — Transfer of Care (Signed)
Immediate Anesthesia Transfer of Care Note  Patient: Jenna Dudley  Procedure(s) Performed: * No procedures listed *  Patient Location: PACU  Anesthesia Type:General  Level of Consciousness: sedated and responds to stimulation  Airway & Oxygen Therapy: Patient Spontanous Breathing and Patient connected to face mask oxygen  Post-op Assessment: Report given to RN and Post -op Vital signs reviewed and stable  Post vital signs: Reviewed and stable  Last Vitals:  Filed Vitals:   12/09/14 1245  BP: 139/64  Pulse: 80  Temp: 36.6 C  Resp: 23    Complications: No apparent anesthesia complications

## 2014-12-09 NOTE — Anesthesia Preprocedure Evaluation (Signed)
Anesthesia Evaluation  Patient identified by MRN, date of birth, ID band Patient awake    Reviewed: Allergy & Precautions, NPO status   History of Anesthesia Complications (+) history of anesthetic complications  Airway Mallampati: II       Dental  (+) Upper Dentures   Pulmonary former smoker,     + decreased breath sounds      Cardiovascular hypertension, Pt. on medications Normal cardiovascular exam     Neuro/Psych Depression    GI/Hepatic Neg liver ROS, GERD  ,  Endo/Other    Renal/GU negative Renal ROS     Musculoskeletal  (+) Arthritis ,   Abdominal (+) + obese,   Peds  Hematology negative hematology ROS (+)   Anesthesia Other Findings   Reproductive/Obstetrics                             Anesthesia Physical Anesthesia Plan  ASA: III  Anesthesia Plan: General   Post-op Pain Management:    Induction: Intravenous  Airway Management Planned: Mask  Additional Equipment:   Intra-op Plan:   Post-operative Plan:   Informed Consent: I have reviewed the patients History and Physical, chart, labs and discussed the procedure including the risks, benefits and alternatives for the proposed anesthesia with the patient or authorized representative who has indicated his/her understanding and acceptance.     Plan Discussed with: CRNA  Anesthesia Plan Comments:         Anesthesia Quick Evaluation

## 2014-12-16 NOTE — Telephone Encounter (Signed)
Insurance check for ECT. Pt has [employer] and [employer] and no authorization is required.

## 2014-12-17 ENCOUNTER — Other Ambulatory Visit: Payer: Self-pay

## 2014-12-17 MED ORDER — OLANZAPINE 20 MG PO TABS
20.0000 mg | ORAL_TABLET | Freq: Every day | ORAL | Status: DC
Start: 2014-12-17 — End: 2015-02-04

## 2014-12-17 NOTE — Telephone Encounter (Signed)
request for refill for olantzapine  pt last seen on  10-06-14 next appt 02-04-15.

## 2014-12-21 ENCOUNTER — Other Ambulatory Visit: Payer: Self-pay

## 2014-12-21 NOTE — Addendum Note (Signed)
Addendum  created 12/21/14 1630 by Lezlie Octave, MD   Modules edited: Anesthesia Attestations

## 2014-12-23 ENCOUNTER — Encounter
Admission: RE | Admit: 2014-12-23 | Discharge: 2014-12-23 | Disposition: A | Discharge status: Home / Self Care | Payer: Medicare Other | Source: Ambulatory Visit | Attending: Psychiatry | Admitting: Psychiatry

## 2014-12-23 ENCOUNTER — Encounter: Payer: Medicare Other | Admitting: Anesthesiology

## 2014-12-23 ENCOUNTER — Encounter: Payer: Self-pay | Admitting: Anesthesiology

## 2014-12-23 DIAGNOSIS — M47896 Other spondylosis, lumbar region: Secondary | ICD-10-CM | POA: Diagnosis not present

## 2014-12-23 DIAGNOSIS — G5602 Carpal tunnel syndrome, left upper limb: Secondary | ICD-10-CM | POA: Diagnosis not present

## 2014-12-23 DIAGNOSIS — K219 Gastro-esophageal reflux disease without esophagitis: Secondary | ICD-10-CM | POA: Diagnosis not present

## 2014-12-23 DIAGNOSIS — F333 Major depressive disorder, recurrent, severe with psychotic symptoms: Secondary | ICD-10-CM | POA: Insufficient documentation

## 2014-12-23 DIAGNOSIS — M4800 Spinal stenosis, site unspecified: Secondary | ICD-10-CM | POA: Diagnosis not present

## 2014-12-23 DIAGNOSIS — G8929 Other chronic pain: Secondary | ICD-10-CM | POA: Diagnosis not present

## 2014-12-23 DIAGNOSIS — I10 Essential (primary) hypertension: Secondary | ICD-10-CM | POA: Diagnosis not present

## 2014-12-23 DIAGNOSIS — E785 Hyperlipidemia, unspecified: Secondary | ICD-10-CM | POA: Diagnosis not present

## 2014-12-23 DIAGNOSIS — M545 Low back pain: Secondary | ICD-10-CM | POA: Diagnosis not present

## 2014-12-23 MED ORDER — METHOHEXITAL SODIUM 100 MG/10ML IV SOSY
60.0000 mg | PREFILLED_SYRINGE | Freq: Once | INTRAVENOUS | Status: AC
  Administered 2014-12-23: 60 mg via INTRAVENOUS

## 2014-12-23 MED ORDER — LABETALOL HCL 5 MG/ML IV SOLN
20.0000 mg | Freq: Once | INTRAVENOUS | Status: AC
  Administered 2014-12-23: 20 mg via INTRAVENOUS

## 2014-12-23 MED ORDER — DEXTROSE 5 % IV SOLN
250.0000 mL | Freq: Once | INTRAVENOUS | Status: AC
  Administered 2014-12-23: 250 mL via INTRAVENOUS

## 2014-12-23 MED ORDER — KETOROLAC TROMETHAMINE 30 MG/ML IJ SOLN
30.0000 mg | Freq: Once | INTRAMUSCULAR | Status: AC
  Administered 2014-12-23: 30 mg via INTRAVENOUS

## 2014-12-23 MED ORDER — DEXTROSE 5 % IV SOLN
INTRAVENOUS | Status: DC | PRN
  Administered 2014-12-23: 12:00:00 via INTRAVENOUS

## 2014-12-23 MED ORDER — SUCCINYLCHOLINE CHLORIDE 20 MG/ML IJ SOLN
80.0000 mg | Freq: Once | INTRAMUSCULAR | Status: AC
  Administered 2014-12-23: 80 mg via INTRAVENOUS

## 2014-12-23 NOTE — Procedures (Signed)
ECT SERVICES Physician's Interval Evaluation & Treatment Note  Patient Identification: Jenna Dudley MRN:  [id number] Date of Evaluation:  12/23/2014 TX #: 140  MADRS: 15  MMSE: 16  P.E. Findings:  No new physical exam findings vital signs normal  Psychiatric Interval Note:  Mood feeling good cognition stable  Subjective:  Patient is a 79 y.o. female seen for evaluation for Electroconvulsive Therapy. No new complaints  Treatment Summary:     Right Unilateral              Bilateral   % Energy : 0.3 ms, 100%   Impedance: 1790 ohms  Seizure Energy Index: 3934 V squared  Postictal Suppression Index: 57%  Seizure Concordance Index: 94%  Medications  Pre Shock: Labetalol 20 mg, Toradol 30 mg, Brevital 60 mg, succinylcholine 80 mg  Post Shock: None  Seizure Duration: 24 seconds by EMG 47 seconds by EEG   Comments: Follow-up 2 weeks October 12   Lungs:    Clear to auscultation                Other:   Heart:      Regular rhythm              irregular rhythm      Previous H&P reviewed, patient examined and there are NO CHANGES                   Previous H&P reviewed, patient examined and there are changes noted.   Mordecai Rasmussen, MD 9/28/201612:01 PM

## 2014-12-23 NOTE — Transfer of Care (Signed)
Immediate Anesthesia Transfer of Care Note  Patient: Jenna Dudley  Procedure(s) Performed: * No procedures listed *  Patient Location: PACU  Anesthesia Type:General  Level of Consciousness: sedated  Airway & Oxygen Therapy: Patient Spontanous Breathing and Patient connected to face mask oxygen  Post-op Assessment: Report given to RN and Post -op Vital signs reviewed and stable  Post vital signs: Reviewed and stable  Last Vitals:  Filed Vitals:   12/23/14 1217  BP: 139/65  Pulse:   Temp: 37.4 C  Resp: 20    Complications: No apparent anesthesia complications

## 2014-12-23 NOTE — H&P (Signed)
ALTA GODING is an 79 y.o. female.   Chief Complaint: Mood is feeling good. No new specific complaint HPI: Ongoing maintenance ECT treatment currently scheduled every other week tolerating well good control of symptoms  Past Medical History  Diagnosis Date  . Hypertension   . Hyperlipemia 07/29/14  . Chronic low back pain 07/29/14  . Spinal stenosis 07/29/14  . GERD (gastroesophageal reflux disease)   . Depression   . Esophageal spasm 07/29/14  . Bilateral carpal tunnel syndrome 07/27/14  . Degenerative arthritis of lumbar spine 07/27/14    Past Surgical History  Procedure Laterality Date  . Hip surgery Right   . Replacement total knee bilateral Bilateral 07/27/14  . Inner ear surgery Right     Family History  Problem Relation Age of Onset  . Family history unknown: Yes   Social History:  reports that she quit smoking about 39 years ago. Her smoking use included Cigarettes. She does not have any smokeless tobacco history on file. She reports that she does not drink alcohol or use illicit drugs.  Allergies:  Allergies  Allergen Reactions  . Asa [Aspirin]   . Sulfa Antibiotics      (Not in a hospital admission)  No results found for this or any previous visit (from the past 48 hour(s)). No results found.  Review of Systems  Constitutional: Negative.   HENT: Negative.   Eyes: Negative.   Respiratory: Negative.   Cardiovascular: Negative.   Gastrointestinal: Negative.   Musculoskeletal: Negative.   Skin: Negative.   Neurological: Negative.   Psychiatric/Behavioral: Positive for memory loss. Negative for depression, suicidal ideas, hallucinations and substance abuse. The patient is not nervous/anxious and does not have insomnia.     Blood pressure 140/55, pulse 86, temperature 98.2 F (36.8 C), temperature source Oral, resp. rate 18, height 5' (1.524 m), weight 72.576 kg (160 lb), SpO2 95 %. Physical Exam  Nursing note and vitals reviewed. Constitutional: She appears  well-developed and well-nourished.  HENT:  Head: Normocephalic and atraumatic.  Eyes: Conjunctivae are normal. Pupils are equal, round, and reactive to light.  Neck: Normal range of motion.  Cardiovascular: Regular rhythm and normal heart sounds.   Respiratory: Effort normal and breath sounds normal. No respiratory distress. She has no wheezes. She has no rales.  GI: Soft.  Musculoskeletal: Normal range of motion.  Neurological: She is alert.  Skin: Skin is warm and dry.  Psychiatric: She has a normal mood and affect. Her behavior is normal. Judgment and thought content normal.     Assessment/Plan Follow-up treatment October 12  John Clapacs 12/23/2014, 11:59 AM

## 2014-12-23 NOTE — Anesthesia Preprocedure Evaluation (Signed)
Anesthesia Evaluation  Patient identified by MRN, date of birth, ID band Patient awake    Reviewed: Allergy & Precautions, H&P , NPO status , Patient's Chart, lab work & pertinent test results, reviewed documented beta blocker date and time   Airway Mallampati: II  TM Distance: >3 FB Neck ROM: full    Dental no notable dental hx.    Pulmonary neg pulmonary ROS, former smoker,    Pulmonary exam normal breath sounds clear to auscultation       Cardiovascular Exercise Tolerance: Good hypertension, negative cardio ROS   Rhythm:regular Rate:Normal     Neuro/Psych negative neurological ROS  negative psych ROS   GI/Hepatic negative GI ROS, Neg liver ROS, GERD  ,  Endo/Other  negative endocrine ROS  Renal/GU negative Renal ROS  negative genitourinary   Musculoskeletal   Abdominal   Peds  Hematology negative hematology ROS (+)   Anesthesia Other Findings   Reproductive/Obstetrics negative OB ROS                             Anesthesia Physical Anesthesia Plan  ASA: III  Anesthesia Plan: General   Post-op Pain Management:    Induction:   Airway Management Planned:   Additional Equipment:   Intra-op Plan:   Post-operative Plan:   Informed Consent: I have reviewed the patients History and Physical, chart, labs and discussed the procedure including the risks, benefits and alternatives for the proposed anesthesia with the patient or authorized representative who has indicated his/her understanding and acceptance.   Dental Advisory Given  Plan Discussed with: CRNA  Anesthesia Plan Comments:         Anesthesia Quick Evaluation  

## 2014-12-24 NOTE — Anesthesia Postprocedure Evaluation (Signed)
  Anesthesia Post-op Note  Patient: Jenna Dudley  Procedure(s) Performed: * No procedures listed *  Anesthesia type:General  Patient location: PACU  Post pain: Pain level controlled  Post assessment: Post-op Vital signs reviewed, Patient's Cardiovascular Status Stable, Respiratory Function Stable, Patent Airway and No signs of Nausea or vomiting  Post vital signs: Reviewed and stable  Last Vitals:  Filed Vitals:   12/23/14 1255  BP: 128/58  Pulse: 88  Temp:   Resp: 18    Level of consciousness: awake, alert  and patient cooperative  Complications: No apparent anesthesia complications

## 2015-01-04 ENCOUNTER — Telehealth: Payer: Self-pay

## 2015-01-04 ENCOUNTER — Other Ambulatory Visit: Payer: Self-pay | Admitting: Psychiatry

## 2015-01-04 ENCOUNTER — Other Ambulatory Visit: Payer: Self-pay

## 2015-01-04 MED ORDER — QUETIAPINE FUMARATE 300 MG PO TABS: 300.0000 mg | ORAL_TABLET | Freq: Every day | ORAL | Status: DC

## 2015-01-04 NOTE — Telephone Encounter (Signed)
Refill seroquel

## 2015-01-04 NOTE — Telephone Encounter (Signed)
recieved a request for a refill on quetiapine fumarate .  pt last seen on 10-06-14 pt had and appt for 01-05-15 but cancel reason states "pt refused to come in"  pt does have a appt set up for 02-04-15.

## 2015-01-05 ENCOUNTER — Ambulatory Visit: Payer: Medicare Other | Admitting: Psychiatry

## 2015-01-06 ENCOUNTER — Encounter: Payer: Self-pay | Admitting: Anesthesiology

## 2015-01-06 ENCOUNTER — Encounter
Admission: RE | Admit: 2015-01-06 | Discharge: 2015-01-06 | Disposition: A | Discharge status: Home / Self Care | Payer: Medicare Other | Source: Ambulatory Visit | Attending: Psychiatry | Admitting: Psychiatry

## 2015-01-06 DIAGNOSIS — F333 Major depressive disorder, recurrent, severe with psychotic symptoms: Secondary | ICD-10-CM | POA: Insufficient documentation

## 2015-01-06 DIAGNOSIS — M545 Low back pain: Secondary | ICD-10-CM | POA: Insufficient documentation

## 2015-01-06 DIAGNOSIS — Z87891 Personal history of nicotine dependence: Secondary | ICD-10-CM | POA: Insufficient documentation

## 2015-01-06 DIAGNOSIS — G8929 Other chronic pain: Secondary | ICD-10-CM | POA: Insufficient documentation

## 2015-01-06 DIAGNOSIS — I10 Essential (primary) hypertension: Secondary | ICD-10-CM | POA: Diagnosis not present

## 2015-01-06 DIAGNOSIS — E785 Hyperlipidemia, unspecified: Secondary | ICD-10-CM | POA: Insufficient documentation

## 2015-01-06 DIAGNOSIS — G5603 Carpal tunnel syndrome, bilateral upper limbs: Secondary | ICD-10-CM | POA: Insufficient documentation

## 2015-01-06 DIAGNOSIS — K219 Gastro-esophageal reflux disease without esophagitis: Secondary | ICD-10-CM | POA: Insufficient documentation

## 2015-01-06 MED ORDER — DEXTROSE 5 % IV SOLN
250.0000 mL | Freq: Once | INTRAVENOUS | Status: AC
  Administered 2015-01-06: 250 mL via INTRAVENOUS

## 2015-01-06 MED ORDER — METHOHEXITAL SODIUM 100 MG/10ML IV SOSY
60.0000 mg | PREFILLED_SYRINGE | Freq: Once | INTRAVENOUS | Status: AC
  Administered 2015-01-06: 60 mg via INTRAVENOUS

## 2015-01-06 MED ORDER — LABETALOL HCL 5 MG/ML IV SOLN
20.0000 mg | Freq: Once | INTRAVENOUS | Status: AC
  Administered 2015-01-06: 20 mg via INTRAVENOUS

## 2015-01-06 MED ORDER — SUCCINYLCHOLINE CHLORIDE 20 MG/ML IJ SOLN
80.0000 mg | Freq: Once | INTRAMUSCULAR | Status: AC
  Administered 2015-01-06: 80 mg via INTRAVENOUS

## 2015-01-06 MED ORDER — KETOROLAC TROMETHAMINE 30 MG/ML IJ SOLN
30.0000 mg | Freq: Once | INTRAMUSCULAR | Status: AC
  Administered 2015-01-06: 30 mg via INTRAVENOUS

## 2015-01-06 NOTE — Transfer of Care (Signed)
Immediate Anesthesia Transfer of Care Note  Patient: Jenna Dudley  Procedure(s) Performed: ECT  Patient Location: PACU  Anesthesia Type:General  Level of Consciousness: sedated  Airway & Oxygen Therapy: Patient Spontanous Breathing and Patient connected to face mask oxygen  Post-op Assessment: Report given to RN and Post -op Vital signs reviewed and stable  Post vital signs: Reviewed and stable  Last Vitals:  Filed Vitals:   01/06/15 1142  BP: 145/64  Pulse: 79  Temp: 36.8 C  Resp: 25    Complications: No apparent anesthesia complications

## 2015-01-06 NOTE — H&P (Signed)
Jenna Dudley is an 79 y.o. female.   Chief Complaint: No new complaint. Chronic lower back pain. No different than usual. Mood is doing well. 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 Hypertension   . Hyperlipemia 07/29/14  . Chronic low back pain 07/29/14  . Spinal stenosis 07/29/14  . GERD (gastroesophageal reflux disease)   . Depression   . Esophageal spasm 07/29/14  . Bilateral carpal tunnel syndrome 07/27/14  . Degenerative arthritis of lumbar spine 07/27/14    Past Surgical History  Procedure Laterality Date  . Hip surgery Right   . Replacement total knee bilateral Bilateral 07/27/14  . Inner ear surgery Right     Family History  Problem Relation Age of Onset  . Family history unknown: Yes   Social History:  reports that she quit smoking about 39 years ago. Her smoking use included Cigarettes. She does not have any smokeless tobacco history on file. She reports that she does not drink alcohol or use illicit drugs.  Allergies:  Allergies  Allergen Reactions  . Asa [Aspirin]   . Sulfa Antibiotics      (Not in a hospital admission)  No results found for this or any previous visit (from the past 48 hour(s)). No results found.  Review of Systems  Constitutional: Negative.   HENT: Negative.   Eyes: Negative.   Respiratory: Negative.   Cardiovascular: Negative.   Gastrointestinal: Negative.   Musculoskeletal: Positive for back pain.  Skin: Negative.   Neurological: Negative.   Psychiatric/Behavioral: Negative for depression, suicidal ideas, hallucinations, memory loss and substance abuse. The patient is not nervous/anxious and does not have insomnia.     Blood pressure 147/57, pulse 90, temperature 98 F (36.7 C), temperature source Oral, resp. rate 20, weight 74.844 kg (165 lb), SpO2 98 %. Physical Exam  Nursing note and vitals reviewed. Constitutional: She appears well-developed and well-nourished.   HENT:  Head: Normocephalic and atraumatic.  Eyes: Conjunctivae are normal. Pupils are equal, round, and reactive to light.  Neck: Normal range of motion.  Cardiovascular: Normal rate, regular rhythm and normal heart sounds.   Respiratory: Effort normal and breath sounds normal. No respiratory distress. She has no wheezes. She has no rales.  GI: Soft.  Musculoskeletal: Normal range of motion.  Neurological: She is alert.  Skin: Skin is warm and dry.  Psychiatric: She has a normal mood and affect. Her behavior is normal. Judgment and thought content normal.     Assessment/Plan Follow-up in 2 weeks per her usual schedule  John Clapacs 01/06/2015, 11:22 AM

## 2015-01-06 NOTE — Procedures (Signed)
ECT SERVICES Physician's Interval Evaluation & Treatment Note  Patient Identification: Jenna Dudley MRN:  [id number] Date of Evaluation:  01/06/2015 TX #: [id number]  MADRS:   MMSE:   P.E. Findings:  Chronic back pain no new physical complaints  Psychiatric Interval Note:  Mood is good without any new complaints. No return of depressive symptoms  Subjective:  Patient is a 79 y.o. female seen for evaluation for Electroconvulsive Therapy. Other than chronic back pain no specific complaints  Treatment Summary:   [x]   Right Unilateral             []  Bilateral   % Energy : 0.3 ms, 100%   Impedance: 1480 ohms  Seizure Energy Index: 3484 V squared  Postictal Suppression Index: 19%  Seizure Concordance Index: 84%  Medications  Pre Shock: Toradol 30 mg, labetalol 20 mg, Brevital 60 mg, succinylcholine 80 mg  Post Shock: None  Seizure Duration: 20 seconds by EMG, 45 seconds by EEG   Comments: Patient is stable and doing well with 2 week interval. Follow-up in 2 weeks on October 26   Lungs:  [x]   Clear to auscultation               []  Other:   Heart:    [x]   Regular rhythm             []  irregular rhythm    [x]   Previous H&P reviewed, patient examined and there are NO CHANGES                 []   Previous H&P reviewed, patient examined and there are changes noted.   Mordecai RasmussenJohn Clapacs, MD 10/12/201611:23 AM

## 2015-01-06 NOTE — Anesthesia Preprocedure Evaluation (Signed)
Anesthesia Evaluation  Patient identified by MRN, date of birth, ID band Patient awake    Reviewed: Allergy & Precautions, H&P , NPO status , Patient's Chart, lab work & pertinent test results, reviewed documented beta blocker date and time   Airway Mallampati: II  TM Distance: >3 FB Neck ROM: full    Dental no notable dental hx.    Pulmonary neg pulmonary ROS, former smoker,    Pulmonary exam normal breath sounds clear to auscultation       Cardiovascular Exercise Tolerance: Good hypertension, negative cardio ROS   Rhythm:regular Rate:Normal     Neuro/Psych negative neurological ROS  negative psych ROS   GI/Hepatic negative GI ROS, Neg liver ROS, GERD  ,  Endo/Other  negative endocrine ROS  Renal/GU negative Renal ROS  negative genitourinary   Musculoskeletal   Abdominal   Peds  Hematology negative hematology ROS (+)   Anesthesia Other Findings   Reproductive/Obstetrics negative OB ROS                             Anesthesia Physical Anesthesia Plan  ASA: III  Anesthesia Plan: General   Post-op Pain Management:    Induction:   Airway Management Planned:   Additional Equipment:   Intra-op Plan:   Post-operative Plan:   Informed Consent: I have reviewed the patients History and Physical, chart, labs and discussed the procedure including the risks, benefits and alternatives for the proposed anesthesia with the patient or authorized representative who has indicated his/her understanding and acceptance.   Dental Advisory Given  Plan Discussed with: CRNA  Anesthesia Plan Comments:         Anesthesia Quick Evaluation  

## 2015-01-06 NOTE — Anesthesia Procedure Notes (Signed)
Date/Time: 01/06/2015 11:29 AM Performed by: Lily KocherPERALTA, Jenna Mogle Pre-anesthesia Checklist: Patient identified, Emergency Drugs available, Suction available and Patient being monitored Patient Re-evaluated:Patient Re-evaluated prior to inductionOxygen Delivery Method: Circle system utilized Preoxygenation: Pre-oxygenation with 100% oxygen Intubation Type: IV induction Ventilation: Mask ventilation without difficulty and Mask ventilation throughout procedure Airway Equipment and Method: Bite block Placement Confirmation: positive ETCO2 Dental Injury: Teeth and Oropharynx as per pre-operative assessment

## 2015-01-07 NOTE — Anesthesia Postprocedure Evaluation (Signed)
  Anesthesia Post-op Note  Patient: Jenna DaftJoan M Camps  Procedure(s) Performed: * No procedures listed *  Anesthesia type:General  Patient location: PACU  Post pain: Pain level controlled  Post assessment: Post-op Vital signs reviewed, Patient's Cardiovascular Status Stable, Respiratory Function Stable, Patent Airway and No signs of Nausea or vomiting  Post vital signs: Reviewed and stable  Last Vitals:  Filed Vitals:   01/06/15 1220  BP: 136/64  Pulse: 78  Temp:   Resp: 20    Level of consciousness: awake, alert  and patient cooperative  Complications: No apparent anesthesia complications

## 2015-01-13 ENCOUNTER — Other Ambulatory Visit: Payer: Self-pay | Admitting: *Deleted

## 2015-01-18 ENCOUNTER — Other Ambulatory Visit: Payer: Self-pay

## 2015-01-18 NOTE — Telephone Encounter (Signed)
received a fax requesting refill on  mirtazapine 45mg  take 1 tablet by mouth once daily.  pt last seen on 10-06-14 ,  pt canceled appt on  01-05-15. pt has appt for  02-04-15. Made out rx for just enough to last until comes in for her appt.

## 2015-01-19 MED ORDER — MIRTAZAPINE 45 MG PO TABS: 45.0000 mg | ORAL_TABLET | Freq: Every day | ORAL | Status: DC

## 2015-01-20 ENCOUNTER — Encounter: Payer: Self-pay | Admitting: Anesthesiology

## 2015-01-20 ENCOUNTER — Encounter
Admission: RE | Admit: 2015-01-20 | Discharge: 2015-01-20 | Disposition: A | Discharge status: Home / Self Care | Payer: Medicare Other | Source: Ambulatory Visit | Attending: Psychiatry | Admitting: Psychiatry

## 2015-01-20 DIAGNOSIS — K219 Gastro-esophageal reflux disease without esophagitis: Secondary | ICD-10-CM | POA: Insufficient documentation

## 2015-01-20 DIAGNOSIS — F333 Major depressive disorder, recurrent, severe with psychotic symptoms: Secondary | ICD-10-CM | POA: Insufficient documentation

## 2015-01-20 DIAGNOSIS — G8929 Other chronic pain: Secondary | ICD-10-CM | POA: Insufficient documentation

## 2015-01-20 DIAGNOSIS — G5603 Carpal tunnel syndrome, bilateral upper limbs: Secondary | ICD-10-CM | POA: Diagnosis not present

## 2015-01-20 DIAGNOSIS — Z87891 Personal history of nicotine dependence: Secondary | ICD-10-CM | POA: Diagnosis not present

## 2015-01-20 DIAGNOSIS — M545 Low back pain: Secondary | ICD-10-CM | POA: Insufficient documentation

## 2015-01-20 DIAGNOSIS — E785 Hyperlipidemia, unspecified: Secondary | ICD-10-CM | POA: Insufficient documentation

## 2015-01-20 DIAGNOSIS — I10 Essential (primary) hypertension: Secondary | ICD-10-CM | POA: Insufficient documentation

## 2015-01-20 MED ORDER — KETOROLAC TROMETHAMINE 30 MG/ML IJ SOLN
30.0000 mg | Freq: Once | INTRAMUSCULAR | Status: AC
  Administered 2015-01-20: 30 mg via INTRAVENOUS

## 2015-01-20 MED ORDER — DEXTROSE 5 % IV SOLN
250.0000 mL | Freq: Once | INTRAVENOUS | Status: AC
  Administered 2015-01-20: 250 mL via INTRAVENOUS

## 2015-01-20 MED ORDER — METHOHEXITAL SODIUM 100 MG/10ML IV SOSY
60.0000 mg | PREFILLED_SYRINGE | Freq: Once | INTRAVENOUS | Status: AC
  Administered 2015-01-20: 60 mg via INTRAVENOUS

## 2015-01-20 MED ORDER — LABETALOL HCL 5 MG/ML IV SOLN
20.0000 mg | Freq: Once | INTRAVENOUS | Status: AC
  Administered 2015-01-20: 20 mg via INTRAVENOUS

## 2015-01-20 MED ORDER — SUCCINYLCHOLINE CHLORIDE 20 MG/ML IJ SOLN
80.0000 mg | Freq: Once | INTRAMUSCULAR | Status: AC
  Administered 2015-01-20: 80 mg via INTRAVENOUS

## 2015-01-20 NOTE — Transfer of Care (Signed)
Immediate Anesthesia Transfer of Care Note  Patient: Jenna Dudley  Procedure(s) Performed: * No procedures listed *  Patient Location: PACU  Anesthesia Type:General  Level of Consciousness: awake  Airway & Oxygen Therapy: Patient Spontanous Breathing and Patient connected to nasal cannula oxygen  Post-op Assessment: Report given to RN and Post -op Vital signs reviewed and stable  Post vital signs: Reviewed and stable  Last Vitals:  Filed Vitals:   01/20/15 0853  BP: 129/48  Pulse: 87  Temp: 37.2 C  Resp: 20    Complications: No apparent anesthesia complications

## 2015-01-20 NOTE — Procedures (Signed)
ECT SERVICES Physician's Interval Evaluation & Treatment Note  Patient Identification: Jenna Dudley MRN:  [id number] Date of Evaluation:  01/20/2015 TX #: 142  MADRS:   MMSE:   P.E. Findings:  No changes to physical exam.  Psychiatric Interval Note:  Mood is good no return of depression or psychotic symptoms  Subjective:  Patient is a 79 y.o. female seen for evaluation for Electroconvulsive Therapy. No new complaints. Chronic back pain no worse than usual  Treatment Summary:   [x]   Right Unilateral             []  Bilateral   % Energy : 0.3 ms 100%   Impedance: 1710 ohms  Seizure Energy Index: 4480 V squared  Postictal Suppression Index: 71%  Seizure Concordance Index: 94%  Medications  Pre Shock: Toradol 30 mg, labetalol 20 mg, Brevital 60 mg, succinylcholine 80 mg  Post Shock: None  Seizure Duration: 23 seconds by EMG, 47 seconds by EEG   Comments: Stable maintenance treatment. No change to plan. Follow-up 2 weeks on November 9   Lungs:  [x]   Clear to auscultation               []  Other:   Heart:    [x]   Regular rhythm             []  irregular rhythm    [x]   Previous H&P reviewed, patient examined and there are NO CHANGES                 []   Previous H&P reviewed, patient examined and there are changes noted.   Mordecai RasmussenJohn Carrington Mullenax, MD 10/26/201610:39 AM

## 2015-01-20 NOTE — Anesthesia Preprocedure Evaluation (Signed)
Anesthesia Evaluation  Patient identified by MRN, date of birth, ID band  Reviewed: Allergy & Precautions, NPO status , Patient's Chart, lab work & pertinent test results  Airway Mallampati: III       Dental no notable dental hx.    Pulmonary COPD, former smoker,    Pulmonary exam normal        Cardiovascular hypertension, Pt. on medications Normal cardiovascular exam     Neuro/Psych Depression    GI/Hepatic Neg liver ROS, GERD  ,  Endo/Other  negative endocrine ROS  Renal/GU negative Renal ROS     Musculoskeletal   Abdominal (+) + obese,   Peds  Hematology negative hematology ROS (+)   Anesthesia Other Findings   Reproductive/Obstetrics                             Anesthesia Physical Anesthesia Plan  ASA: III  Anesthesia Plan: General   Post-op Pain Management:    Induction: Intravenous  Airway Management Planned: Mask  Additional Equipment:   Intra-op Plan:   Post-operative Plan:   Informed Consent: I have reviewed the patients History and Physical, chart, labs and discussed the procedure including the risks, benefits and alternatives for the proposed anesthesia with the patient or authorized representative who has indicated his/her understanding and acceptance.     Plan Discussed with: CRNA  Anesthesia Plan Comments:         Anesthesia Quick Evaluation

## 2015-01-20 NOTE — Anesthesia Postprocedure Evaluation (Signed)
  Anesthesia Post-op Note  Patient: Jenna Dudley  Procedure(s) Performed: * No procedures listed *  Anesthesia type:No value filed.  Patient location: PACU  Post pain: Pain level controlled  Post assessment: Post-op Vital signs reviewed, Patient's Cardiovascular Status Stable, Respiratory Function Stable, Patent Airway and No signs of Nausea or vomiting  Post vital signs: Reviewed and stable  Last Vitals:  Filed Vitals:   01/20/15 0853  BP: 129/48  Pulse: 87  Temp: 37.2 C  Resp: 20    Level of consciousness: awake, alert  and patient cooperative  Complications: No apparent anesthesia complications

## 2015-01-20 NOTE — H&P (Signed)
Jenna Dudley is an 79 y.o. female.   Chief Complaint: Patient continues to have her chronic back pain but has no other new complaints HPI: No significant change or new complaints since our last visit  Past Medical History  Diagnosis Date  . Hypertension   . Hyperlipemia 07/29/14  . Chronic low back pain 07/29/14  . Spinal stenosis 07/29/14  . GERD (gastroesophageal reflux disease)   . Depression   . Esophageal spasm 07/29/14  . Bilateral carpal tunnel syndrome 07/27/14  . Degenerative arthritis of lumbar spine 07/27/14    Past Surgical History  Procedure Laterality Date  . Hip surgery Right   . Replacement total knee bilateral Bilateral 07/27/14  . Inner ear surgery Right     Family History  Problem Relation Age of Onset  . Family history unknown: Yes   Social History:  reports that she quit smoking about 39 years ago. Her smoking use included Cigarettes. She does not have any smokeless tobacco history on file. She reports that she does not drink alcohol or use illicit drugs.  Allergies:  Allergies  Allergen Reactions  . Asa [Aspirin]   . Sulfa Antibiotics      (Not in a hospital admission)  No results found for this or any previous visit (from the past 48 hour(s)). No results found.  Review of Systems  Constitutional: Negative.   HENT: Negative.   Eyes: Negative.   Respiratory: Negative.   Cardiovascular: Negative.   Gastrointestinal: Negative.   Musculoskeletal: Positive for back pain.  Skin: Negative.   Neurological: Negative.   Psychiatric/Behavioral: Negative for depression, suicidal ideas, hallucinations, memory loss and substance abuse. The patient is not nervous/anxious and does not have insomnia.     Blood pressure 129/48, pulse 87, temperature 98.9 F (37.2 C), temperature source Oral, resp. rate 20, weight 76.204 kg (168 lb), SpO2 99 %. Physical Exam  Nursing note and vitals reviewed. Constitutional: She appears well-developed and well-nourished.  HENT:   Head: Normocephalic and atraumatic.  Eyes: Conjunctivae are normal. Pupils are equal, round, and reactive to light.  Neck: Normal range of motion.  Cardiovascular: Normal rate, regular rhythm and normal heart sounds.   Respiratory: Effort normal and breath sounds normal. No respiratory distress. She has no wheezes.  GI: Soft.  Musculoskeletal: Normal range of motion.  Neurological: She is alert.  Skin: Skin is warm and dry.  Psychiatric: She has a normal mood and affect. Her behavior is normal. Judgment and thought content normal.     Assessment/Plan Patient is doing well. Chronic back pain. Benefits very clearly from maintenance phase ECT. Follow-up in 2 weeks.  John Clapacs 01/20/2015, 10:37 AM

## 2015-01-26 NOTE — Transfer of Care (Incomplete)
Immediate Anesthesia Transfer of Care Note  Patient: Jenna DaftJoan M Montefusco  Procedure(s) Performed: * No procedures listed *  Patient Location: {PLACES; ANE POST:19477::"PACU"}  Anesthesia Type:{PROCEDURES; ANE POST ANESTHESIA TYPE:19480}  Level of Consciousness: {FINDINGS; ANE POST LEVEL OF CONSCIOUSNESS:19484}  Airway & Oxygen Therapy: {Exam; oxygen device:30095}  Post-op Assessment: {ASSESSMENT;POST-OP VQQVZD:63875}REPORT:18741}  Post vital signs: {DESC; ANE POST IEPPIR:51884}VITALS:19483}  Last Vitals:  Filed Vitals:   01/20/15 1136  BP: 101/49  Pulse: 78  Temp:   Resp: 18    Complications: {FINDINGS; ANE POST COMPLICATIONS:19485}

## 2015-01-27 ENCOUNTER — Other Ambulatory Visit: Payer: Self-pay | Admitting: *Deleted

## 2015-02-03 ENCOUNTER — Encounter: Payer: Self-pay | Admitting: Anesthesiology

## 2015-02-03 ENCOUNTER — Encounter
Admission: RE | Admit: 2015-02-03 | Discharge: 2015-02-03 | Disposition: A | Discharge status: Home / Self Care | Payer: Medicare Other | Source: Ambulatory Visit | Attending: Psychiatry | Admitting: Psychiatry

## 2015-02-03 DIAGNOSIS — G5603 Carpal tunnel syndrome, bilateral upper limbs: Secondary | ICD-10-CM | POA: Insufficient documentation

## 2015-02-03 DIAGNOSIS — M47896 Other spondylosis, lumbar region: Secondary | ICD-10-CM | POA: Diagnosis not present

## 2015-02-03 DIAGNOSIS — I10 Essential (primary) hypertension: Secondary | ICD-10-CM | POA: Insufficient documentation

## 2015-02-03 DIAGNOSIS — K219 Gastro-esophageal reflux disease without esophagitis: Secondary | ICD-10-CM | POA: Diagnosis not present

## 2015-02-03 DIAGNOSIS — Z9889 Other specified postprocedural states: Secondary | ICD-10-CM | POA: Insufficient documentation

## 2015-02-03 DIAGNOSIS — F333 Major depressive disorder, recurrent, severe with psychotic symptoms: Secondary | ICD-10-CM | POA: Diagnosis present

## 2015-02-03 DIAGNOSIS — E785 Hyperlipidemia, unspecified: Secondary | ICD-10-CM | POA: Diagnosis not present

## 2015-02-03 MED ORDER — METHOHEXITAL SODIUM 100 MG/10ML IV SOSY
60.0000 mg | PREFILLED_SYRINGE | Freq: Once | INTRAVENOUS | Status: AC
  Administered 2015-02-03: 60 mg via INTRAVENOUS

## 2015-02-03 MED ORDER — DEXTROSE 5 % IV SOLN
250.0000 mL | Freq: Once | INTRAVENOUS | Status: AC
  Administered 2015-02-03: 250 mL via INTRAVENOUS

## 2015-02-03 MED ORDER — FENTANYL CITRATE (PF) 100 MCG/2ML IJ SOLN: 25.0000 ug | INTRAMUSCULAR | Status: DC | PRN

## 2015-02-03 MED ORDER — KETOROLAC TROMETHAMINE 30 MG/ML IJ SOLN
30.0000 mg | Freq: Once | INTRAMUSCULAR | Status: AC
  Administered 2015-02-03: 30 mg via INTRAVENOUS

## 2015-02-03 MED ORDER — SUCCINYLCHOLINE CHLORIDE 20 MG/ML IJ SOLN
80.0000 mg | Freq: Once | INTRAMUSCULAR | Status: AC
  Administered 2015-02-03: 80 mg via INTRAVENOUS

## 2015-02-03 MED ORDER — ONDANSETRON HCL 4 MG/2ML IJ SOLN: 4.0000 mg | Freq: Once | INTRAMUSCULAR | Status: DC | PRN

## 2015-02-03 MED ORDER — LABETALOL HCL 5 MG/ML IV SOLN
20.0000 mg | Freq: Once | INTRAVENOUS | Status: AC
  Administered 2015-02-03: 20 mg via INTRAVENOUS

## 2015-02-03 NOTE — Anesthesia Preprocedure Evaluation (Signed)
Anesthesia Evaluation  Patient identified by MRN, date of birth, ID band Patient awake    Reviewed: Allergy & Precautions, NPO status , Patient's Chart, lab work & pertinent test results  Airway Mallampati: III       Dental no notable dental hx.    Pulmonary COPD, former smoker,    Pulmonary exam normal        Cardiovascular hypertension, Pt. on medications Normal cardiovascular exam     Neuro/Psych PSYCHIATRIC DISORDERS Depression  Neuromuscular disease    GI/Hepatic Neg liver ROS, GERD  Medicated and Controlled,  Endo/Other  negative endocrine ROS  Renal/GU negative Renal ROS     Musculoskeletal  (+) Arthritis , Osteoarthritis,    Abdominal (+) + obese,   Peds  Hematology negative hematology ROS (+)   Anesthesia Other Findings   Reproductive/Obstetrics                             Anesthesia Physical  Anesthesia Plan  ASA: III  Anesthesia Plan: General   Post-op Pain Management:    Induction: Intravenous  Airway Management Planned: Mask  Additional Equipment:   Intra-op Plan:   Post-operative Plan:   Informed Consent: I have reviewed the patients History and Physical, chart, labs and discussed the procedure including the risks, benefits and alternatives for the proposed anesthesia with the patient or authorized representative who has indicated his/her understanding and acceptance.   Dental advisory given  Plan Discussed with: CRNA and Surgeon  Anesthesia Plan Comments:         Anesthesia Quick Evaluation

## 2015-02-03 NOTE — Anesthesia Postprocedure Evaluation (Signed)
  Anesthesia Post-op Note  Patient: Jenna Dudley  Procedure(s) Performed: * No procedures listed *  Anesthesia type:General  Patient location: PACU  Post pain: Pain level controlled  Post assessment: Post-op Vital signs reviewed, Patient's Cardiovascular Status Stable, Respiratory Function Stable, Patent Airway and No signs of Nausea or vomiting  Post vital signs: Reviewed and stable  Last Vitals:  Filed Vitals:   02/03/15 1114  BP: 112/63  Pulse:   Temp:   Resp:     Level of consciousness: awake, alert  and patient cooperative  Complications: No apparent anesthesia complications

## 2015-02-03 NOTE — Anesthesia Procedure Notes (Signed)
Date/Time: 02/03/2015 10:15 AM Performed by: Lily KocherPERALTA, Chaunce Winkels Pre-anesthesia Checklist: Patient identified, Emergency Drugs available, Suction available and Patient being monitored Patient Re-evaluated:Patient Re-evaluated prior to inductionOxygen Delivery Method: Circle system utilized Preoxygenation: Pre-oxygenation with 100% oxygen Intubation Type: IV induction Ventilation: Mask ventilation without difficulty and Mask ventilation throughout procedure Airway Equipment and Method: Bite block Placement Confirmation: positive ETCO2 Dental Injury: Teeth and Oropharynx as per pre-operative assessment

## 2015-02-03 NOTE — Transfer of Care (Signed)
Immediate Anesthesia Transfer of Care Note  Patient: Jenna Dudley  Procedure(s) Performed: ECT  Patient Location: PACU  Anesthesia Type:General  Level of Consciousness: sedated  Airway & Oxygen Therapy: Patient Spontanous Breathing and Patient connected to face mask oxygen  Post-op Assessment: Report given to RN  Post vital signs: Reviewed and stable  Last Vitals:  Filed Vitals:   02/03/15 1026  BP: 169/70  Pulse: 79  Temp: 37.7 C  Resp: 14    Complications: No apparent anesthesia complications

## 2015-02-03 NOTE — H&P (Signed)
Jenna DaftJoan M Dudley is an 79 y.o. female.   Chief Complaint: Patient has no new complaint. Chronic back pain. No report report of depression HPI: Since last visit no return of depression or psychotic symptoms medical problems the same.  Past Medical History  Diagnosis Date  . Hypertension   . Hyperlipemia 07/29/14  . Chronic low back pain 07/29/14  . Spinal stenosis 07/29/14  . GERD (gastroesophageal reflux disease)   . Depression   . Esophageal spasm 07/29/14  . Bilateral carpal tunnel syndrome 07/27/14  . Degenerative arthritis of lumbar spine 07/27/14    Past Surgical History  Procedure Laterality Date  . Hip surgery Right   . Replacement total knee bilateral Bilateral 07/27/14  . Inner ear surgery Right     Family History  Problem Relation Age of Onset  . Family history unknown: Yes   Social History:  reports that she quit smoking about 39 years ago. Her smoking use included Cigarettes. She does not have any smokeless tobacco history on file. She reports that she does not drink alcohol or use illicit drugs.  Allergies:  Allergies  Allergen Reactions  . Asa [Aspirin]   . Sulfa Antibiotics      (Not in a hospital admission)  No results found for this or any previous visit (from the past 48 hour(s)). No results found.  Review of Systems  Constitutional: Negative.   HENT: Negative.   Eyes: Negative.   Respiratory: Negative.   Cardiovascular: Negative.   Gastrointestinal: Negative.   Musculoskeletal: Positive for back pain.  Skin: Negative.   Neurological: Negative.   Psychiatric/Behavioral: Negative for depression, suicidal ideas, hallucinations, memory loss and substance abuse. The patient is not nervous/anxious and does not have insomnia.     Blood pressure 135/63, pulse 89, temperature 98.7 F (37.1 C), temperature source Oral, resp. rate 18, height 5' (1.524 m), weight 77.338 kg (170 lb 8 oz), SpO2 96 %. Physical Exam  Nursing note and vitals reviewed. Constitutional: She  appears well-developed and well-nourished.  HENT:  Head: Normocephalic and atraumatic.  Eyes: Conjunctivae are normal. Pupils are equal, round, and reactive to light.  Neck: Normal range of motion.  Cardiovascular: Normal heart sounds.   Respiratory: Effort normal.  GI: Soft.  Musculoskeletal: Normal range of motion.  Neurological: She is alert.  Skin: Skin is warm and dry.  Psychiatric: She has a normal mood and affect. Her behavior is normal. Judgment and thought content normal.     Assessment/Plan Continue maintenance treatment every 2 weeks  Torris House 02/03/2015, 10:06 AM

## 2015-02-03 NOTE — Procedures (Signed)
ECT SERVICES Physician's Interval Evaluation & Treatment Note  Patient Identification: Jenna Dudley MRN:  [id number] Date of Evaluation:  02/03/2015 TX #: 143  MADRS: 13  MMSE: 21  P.E. Findings:  No change to physical exam chronic back pain  Psychiatric Interval Note:  No change to psychiatric exam or complaints  Subjective:  Patient is a 79 y.o. female seen for evaluation for Electroconvulsive Therapy. No new concerns.  Treatment Summary:   [x]   Right Unilateral             []  Bilateral   % Energy : 0.3 ms 100%   Impedance: 1550 ohms  Seizure Energy Index: 3797 V squared  Postictal Suppression Index: 79%  Seizure Concordance Index: 88%  Medications  Pre Shock: Labetalol 20 mg, Toradol 30 mg, Brevital 60 mg, succinylcholine 80 mg  Post Shock: None  Seizure Duration: 17 seconds by EMG, 49 seconds by EEG   Comments: Patient will return for a follow-up treatment on Monday, November 21 because of the holiday   Lungs:  [x]   Clear to auscultation               []  Other:   Heart:    [x]   Regular rhythm             []  irregular rhythm    [x]   Previous H&P reviewed, patient examined and there are NO CHANGES                 []   Previous H&P reviewed, patient examined and there are changes noted.   Mordecai RasmussenJohn Niyah Mamaril, MD 11/9/201610:08 AM

## 2015-02-04 ENCOUNTER — Encounter: Payer: Self-pay | Admitting: Psychiatry

## 2015-02-04 ENCOUNTER — Ambulatory Visit (INDEPENDENT_AMBULATORY_CARE_PROVIDER_SITE_OTHER): Payer: Medicare Other | Admitting: Psychiatry

## 2015-02-04 VITALS — BP 124/82 | HR 98 | Temp 97.9°F | Ht 60.0 in | Wt 170.6 lb

## 2015-02-04 DIAGNOSIS — F333 Major depressive disorder, recurrent, severe with psychotic symptoms: Secondary | ICD-10-CM

## 2015-02-04 MED ORDER — OLANZAPINE 20 MG PO TABS: 20.0000 mg | ORAL_TABLET | Freq: Every day | ORAL | Status: DC

## 2015-02-04 MED ORDER — MIRTAZAPINE 45 MG PO TABS
45.0000 mg | ORAL_TABLET | Freq: Every day | ORAL | Status: DC
Start: 2015-02-04 — End: 2015-10-20

## 2015-02-04 MED ORDER — SERTRALINE HCL 100 MG PO TABS: 200.0000 mg | ORAL_TABLET | Freq: Every day | ORAL | Status: DC

## 2015-02-04 MED ORDER — QUETIAPINE FUMARATE 300 MG PO TABS: 300.0000 mg | ORAL_TABLET | Freq: Every day | ORAL | Status: DC

## 2015-02-10 ENCOUNTER — Other Ambulatory Visit: Payer: Self-pay

## 2015-02-15 ENCOUNTER — Encounter: Payer: Self-pay | Admitting: *Deleted

## 2015-02-15 ENCOUNTER — Encounter
Admission: RE | Admit: 2015-02-15 | Discharge: 2015-02-15 | Disposition: A | Discharge status: Home / Self Care | Payer: Medicare Other | Source: Ambulatory Visit | Attending: Psychiatry | Admitting: Psychiatry

## 2015-02-15 ENCOUNTER — Encounter: Payer: Medicare Other | Admitting: *Deleted

## 2015-02-15 DIAGNOSIS — K219 Gastro-esophageal reflux disease without esophagitis: Secondary | ICD-10-CM | POA: Diagnosis not present

## 2015-02-15 DIAGNOSIS — M4800 Spinal stenosis, site unspecified: Secondary | ICD-10-CM | POA: Insufficient documentation

## 2015-02-15 DIAGNOSIS — M47896 Other spondylosis, lumbar region: Secondary | ICD-10-CM | POA: Diagnosis not present

## 2015-02-15 DIAGNOSIS — E785 Hyperlipidemia, unspecified: Secondary | ICD-10-CM | POA: Insufficient documentation

## 2015-02-15 DIAGNOSIS — Z9889 Other specified postprocedural states: Secondary | ICD-10-CM | POA: Insufficient documentation

## 2015-02-15 DIAGNOSIS — Z87891 Personal history of nicotine dependence: Secondary | ICD-10-CM | POA: Insufficient documentation

## 2015-02-15 DIAGNOSIS — K224 Dyskinesia of esophagus: Secondary | ICD-10-CM | POA: Diagnosis not present

## 2015-02-15 DIAGNOSIS — I10 Essential (primary) hypertension: Secondary | ICD-10-CM | POA: Diagnosis not present

## 2015-02-15 DIAGNOSIS — G5603 Carpal tunnel syndrome, bilateral upper limbs: Secondary | ICD-10-CM | POA: Diagnosis not present

## 2015-02-15 DIAGNOSIS — F333 Major depressive disorder, recurrent, severe with psychotic symptoms: Secondary | ICD-10-CM | POA: Diagnosis present

## 2015-02-15 MED ORDER — DEXTROSE 5 % IV SOLN
250.0000 mL | Freq: Once | INTRAVENOUS | Status: AC
  Administered 2015-02-15: 250 mL via INTRAVENOUS

## 2015-02-15 MED ORDER — LABETALOL HCL 5 MG/ML IV SOLN
20.0000 mg | Freq: Once | INTRAVENOUS | Status: AC
  Administered 2015-02-15: 20 mg via INTRAVENOUS

## 2015-02-15 MED ORDER — DEXTROSE 5 % IV SOLN
INTRAVENOUS | Status: DC | PRN
  Administered 2015-02-15: 11:00:00 via INTRAVENOUS

## 2015-02-15 MED ORDER — KETOROLAC TROMETHAMINE 30 MG/ML IJ SOLN
30.0000 mg | Freq: Once | INTRAMUSCULAR | Status: AC
  Administered 2015-02-15: 30 mg via INTRAVENOUS

## 2015-02-15 MED ORDER — SUCCINYLCHOLINE CHLORIDE 20 MG/ML IJ SOLN
80.0000 mg | Freq: Once | INTRAMUSCULAR | Status: AC
  Administered 2015-02-15: 80 mg via INTRAVENOUS

## 2015-02-15 MED ORDER — METHOHEXITAL SODIUM 100 MG/10ML IV SOSY
60.0000 mg | PREFILLED_SYRINGE | Freq: Once | INTRAVENOUS | Status: AC
  Administered 2015-02-15: 60 mg via INTRAVENOUS

## 2015-02-15 NOTE — Procedures (Signed)
ECT SERVICES Physician's Interval Evaluation & Treatment Note  Patient Identification: Jenna Dudley MRN:  [id number] Date of Evaluation:  02/15/2015 TX #: 144  MADRS:   MMSE:   P.E. Findings:  Chronic low back pain but no new physical complaints or findings  Psychiatric Interval Note:  Mood is feeling good no return of depression or psychosis  Subjective:  Patient is a 79 y.o. female seen for evaluation for Electroconvulsive Therapy. No specific complaint  Treatment Summary:   [x]   Right Unilateral             []  Bilateral   % Energy : 0.3 ms 100%   Impedance: 1510 ohms  Seizure Energy Index: 3758 V squared  Postictal Suppression Index: 61%  Seizure Concordance Index: 81%  Medications  Pre Shock: Xylocaine 4 mg, labetalol 20 mg, Toradol 30 mg, Brevital 60 mg, succinylcholine 80 mg  Post Shock: None  Seizure Duration: 19 seconds by EMG 45 seconds by EEG   Comments: Follow-up in 2 weeks on December 7   Lungs:  [x]   Clear to auscultation               []  Other:   Heart:    [x]   Regular rhythm             []  irregular rhythm    [x]   Previous H&P reviewed, patient examined and there are NO CHANGES                 []   Previous H&P reviewed, patient examined and there are changes noted.   Mordecai RasmussenJohn Clapacs, MD 11/21/201611:26 AM

## 2015-02-15 NOTE — Anesthesia Preprocedure Evaluation (Signed)
Anesthesia Evaluation  Patient identified by MRN, date of birth, ID band Patient awake    Reviewed: Allergy & Precautions, NPO status , Patient's Chart, lab work & pertinent test results  Airway Mallampati: II  TM Distance: >3 FB Neck ROM: Limited    Dental  (+) Upper Dentures   Pulmonary former smoker,    Pulmonary exam normal        Cardiovascular Exercise Tolerance: Poor hypertension, Pt. on medications Normal cardiovascular exam     Neuro/Psych    GI/Hepatic GERD  Medicated and Controlled,  Endo/Other    Renal/GU      Musculoskeletal   Abdominal (+) + obese,   Peds  Hematology   Anesthesia Other Findings   Reproductive/Obstetrics                             Anesthesia Physical Anesthesia Plan  ASA: III  Anesthesia Plan: General   Post-op Pain Management:    Induction: Intravenous  Airway Management Planned: Mask  Additional Equipment:   Intra-op Plan:   Post-operative Plan:   Informed Consent: I have reviewed the patients History and Physical, chart, labs and discussed the procedure including the risks, benefits and alternatives for the proposed anesthesia with the patient or authorized representative who has indicated his/her understanding and acceptance.     Plan Discussed with: CRNA  Anesthesia Plan Comments:         Anesthesia Quick Evaluation

## 2015-02-15 NOTE — Anesthesia Procedure Notes (Signed)
Date/Time: 02/15/2015 11:26 AM Performed by: Chong SicilianLOPEZ, Riane Rung Pre-anesthesia Checklist: Patient identified, Emergency Drugs available, Suction available and Patient being monitored Patient Re-evaluated:Patient Re-evaluated prior to inductionOxygen Delivery Method: Circle system utilized Preoxygenation: Pre-oxygenation with 100% oxygen Intubation Type: IV induction Ventilation: Mask ventilation without difficulty and Mask ventilation throughout procedure Airway Equipment and Method: Bite block Placement Confirmation: positive ETCO2 Dental Injury: Teeth and Oropharynx as per pre-operative assessment

## 2015-02-15 NOTE — Transfer of Care (Signed)
Immediate Anesthesia Transfer of Care Note  Patient: Jenna DaftJoan M Dudley  Procedure(s) Performed: * No procedures listed *  Patient Location: PACU  Anesthesia Type:General  Level of Consciousness: sedated  Airway & Oxygen Therapy: Patient Spontanous Breathing and Patient connected to face mask oxygen  Post-op Assessment: Report given to RN and Post -op Vital signs reviewed and stable  Post vital signs: Reviewed and stable  Last Vitals:  Filed Vitals:   02/15/15 1025  BP: 144/56  Pulse: 85  Temp: 36.7 C  Resp: 16    Complications: No apparent anesthesia complications

## 2015-02-15 NOTE — Anesthesia Postprocedure Evaluation (Signed)
Anesthesia Post Note  Patient: Jenna Dudley  Procedure(s) Performed: * No procedures listed *  Patient location during evaluation: PACU Anesthesia Type: General Level of consciousness: awake and alert Pain management: pain level controlled Vital Signs Assessment: post-procedure vital signs reviewed and stable Respiratory status: spontaneous breathing Cardiovascular status: blood pressure returned to baseline Postop Assessment: No headache Anesthetic complications: no    Last Vitals:  Filed Vitals:   02/15/15 1213 02/15/15 1230  BP: 151/61 127/91  Pulse: 79 78  Temp:    Resp: 28 18    Last Pain:  Filed Vitals:   02/15/15 1231  PainSc: 0-No pain                 Jaimen Melone M

## 2015-02-15 NOTE — H&P (Signed)
Jenna Dudley is an 79 y.o. female.   Chief Complaint: Patient has no new chief complaint. Mood has been good. She has chronic low back pain. HPI: Patient receiving ECT maintenance for recurrent severe depression. Currently doing well and tolerating treatment well.  Past Medical History  Diagnosis Date  . Hypertension   . Hyperlipemia 07/29/14  . Chronic low back pain 07/29/14  . Spinal stenosis 07/29/14  . GERD (gastroesophageal reflux disease)   . Depression   . Esophageal spasm 07/29/14  . Bilateral carpal tunnel syndrome 07/27/14  . Degenerative arthritis of lumbar spine 07/27/14    Past Surgical History  Procedure Laterality Date  . Hip surgery Right   . Replacement total knee bilateral Bilateral 07/27/14  . Inner ear surgery Right     Family History  Problem Relation Age of Onset  . Family history unknown: Yes   Social History:  reports that she quit smoking about 39 years ago. Her smoking use included Cigarettes. She has never used smokeless tobacco. She reports that she does not drink alcohol or use illicit drugs.  Allergies:  Allergies  Allergen Reactions  . Asa [Aspirin]   . Sulfa Antibiotics      (Not in a hospital admission)  No results found for this or any previous visit (from the past 48 hour(s)). No results found.  Review of Systems  Constitutional: Negative.   HENT: Negative.   Eyes: Negative.   Respiratory: Negative.   Cardiovascular: Negative.   Gastrointestinal: Negative.   Musculoskeletal: Negative.   Skin: Negative.   Neurological: Negative.   Psychiatric/Behavioral: Negative for depression, suicidal ideas, hallucinations, memory loss and substance abuse. The patient is not nervous/anxious and does not have insomnia.     Blood pressure 144/56, pulse 85, temperature 98 F (36.7 C), temperature source Tympanic, resp. rate 16, height 5' (1.524 m), weight 76.204 kg (168 lb), SpO2 97 %. Physical Exam  Nursing note and vitals reviewed. Constitutional: She  appears well-developed and well-nourished.  HENT:  Head: Normocephalic and atraumatic.  Eyes: Conjunctivae are normal. Pupils are equal, round, and reactive to light.  Neck: Normal range of motion.  Cardiovascular: Normal rate, regular rhythm and normal heart sounds.   Respiratory: Effort normal and breath sounds normal. No respiratory distress.  GI: Soft.  Musculoskeletal: Normal range of motion. She exhibits tenderness.  Neurological: She is alert.  Skin: Skin is warm and dry.  Psychiatric: Her speech is normal and behavior is normal. Judgment and thought content normal. Cognition and memory are normal.     Assessment/Plan Follow-up in 2 weeks  Jenna Dudley 02/15/2015, 11:23 AM

## 2015-03-03 ENCOUNTER — Encounter: Payer: Self-pay | Admitting: Anesthesiology

## 2015-03-03 ENCOUNTER — Encounter
Admission: RE | Admit: 2015-03-03 | Discharge: 2015-03-03 | Disposition: A | Discharge status: Home / Self Care | Payer: Medicare Other | Source: Ambulatory Visit | Attending: Psychiatry | Admitting: Psychiatry

## 2015-03-03 DIAGNOSIS — F333 Major depressive disorder, recurrent, severe with psychotic symptoms: Secondary | ICD-10-CM

## 2015-03-03 DIAGNOSIS — Z881 Allergy status to other antibiotic agents status: Secondary | ICD-10-CM | POA: Diagnosis not present

## 2015-03-03 DIAGNOSIS — F329 Major depressive disorder, single episode, unspecified: Secondary | ICD-10-CM | POA: Diagnosis present

## 2015-03-03 DIAGNOSIS — E785 Hyperlipidemia, unspecified: Secondary | ICD-10-CM | POA: Diagnosis not present

## 2015-03-03 DIAGNOSIS — K219 Gastro-esophageal reflux disease without esophagitis: Secondary | ICD-10-CM | POA: Diagnosis not present

## 2015-03-03 DIAGNOSIS — G5603 Carpal tunnel syndrome, bilateral upper limbs: Secondary | ICD-10-CM | POA: Diagnosis not present

## 2015-03-03 DIAGNOSIS — G8929 Other chronic pain: Secondary | ICD-10-CM | POA: Insufficient documentation

## 2015-03-03 DIAGNOSIS — Z886 Allergy status to analgesic agent status: Secondary | ICD-10-CM | POA: Insufficient documentation

## 2015-03-03 DIAGNOSIS — I10 Essential (primary) hypertension: Secondary | ICD-10-CM | POA: Diagnosis not present

## 2015-03-03 DIAGNOSIS — F419 Anxiety disorder, unspecified: Secondary | ICD-10-CM | POA: Insufficient documentation

## 2015-03-03 DIAGNOSIS — M545 Low back pain: Secondary | ICD-10-CM | POA: Diagnosis not present

## 2015-03-03 MED ORDER — METHOHEXITAL SODIUM 100 MG/10ML IV SOSY
60.0000 mg | PREFILLED_SYRINGE | Freq: Once | INTRAVENOUS | Status: AC
  Administered 2015-03-03: 60 mg via INTRAVENOUS

## 2015-03-03 MED ORDER — SUCCINYLCHOLINE CHLORIDE 20 MG/ML IJ SOLN
80.0000 mg | Freq: Once | INTRAMUSCULAR | Status: AC
  Administered 2015-03-03: 80 mg via INTRAVENOUS

## 2015-03-03 MED ORDER — DEXTROSE 5 % IV SOLN
250.0000 mL | Freq: Once | INTRAVENOUS | Status: AC
Start: 2015-03-03 — End: 2015-03-03
  Administered 2015-03-03: 250 mL via INTRAVENOUS

## 2015-03-03 MED ORDER — LABETALOL HCL 5 MG/ML IV SOLN
20.0000 mg | Freq: Once | INTRAVENOUS | Status: AC
  Administered 2015-03-03: 20 mg via INTRAVENOUS

## 2015-03-03 MED ORDER — KETOROLAC TROMETHAMINE 30 MG/ML IJ SOLN
30.0000 mg | Freq: Once | INTRAMUSCULAR | Status: AC
  Administered 2015-03-03: 30 mg via INTRAVENOUS

## 2015-03-03 NOTE — Procedures (Signed)
ECT SERVICES Physician's Interval Evaluation & Treatment Note  Patient Identification: Jenna Dudley MRN:  [id number] Date of Evaluation:  03/03/2015 TX #: 145  MADRS:   MMSE:   P.E. Findings:  Chronic mild back pain no change to overall complaints. A little bit of a flareup of her ear infection. No psychiatric complaints. No change to exam.  Psychiatric Interval Note:  Mood is doing well most of psychosis. Cognitive abilities appear stable  Subjective:  Patient is a 79 y.o. female seen for evaluation for Electroconvulsive Therapy. No specific new complaints  Treatment Summary:   [x]   Right Unilateral             []  Bilateral   % Energy : 0.3 ms 100%   Impedance: 1650 ohms  Seizure Energy Index: 3976 V squared  Postictal Suppression Index: 53%  Seizure Concordance Index: 95%  Medications  Pre Shock: Toradol 30 mg, labetalol 20 mg, Brevital 60 mg, succinylcholine 80 mg  Post Shock: None  Seizure Duration: 22 seconds by EMG, 51 seconds by EEG   Comments: Follow-up in 2 weeks on December 21   Lungs:  [x]   Clear to auscultation               []  Other:   Heart:    [x]   Regular rhythm             []  irregular rhythm    [x]   Previous H&P reviewed, patient examined and there are NO CHANGES                 []   Previous H&P reviewed, patient examined and there are changes noted.   Mordecai RasmussenJohn Artemis Loyal, MD 12/7/201610:59 AM

## 2015-03-03 NOTE — Anesthesia Procedure Notes (Signed)
Date/Time: 03/03/2015 11:04 AM Performed by: Lily KocherPERALTA, Peter Daquila Pre-anesthesia Checklist: Patient identified, Emergency Drugs available, Suction available and Patient being monitored Patient Re-evaluated:Patient Re-evaluated prior to inductionOxygen Delivery Method: Circle system utilized Preoxygenation: Pre-oxygenation with 100% oxygen Intubation Type: IV induction Ventilation: Mask ventilation without difficulty and Mask ventilation throughout procedure Airway Equipment and Method: Bite block Placement Confirmation: positive ETCO2 Dental Injury: Teeth and Oropharynx as per pre-operative assessment

## 2015-03-03 NOTE — H&P (Signed)
Jenna DaftJoan M Dudley is an 79 y.o. female.   Chief Complaint: No new complaint. Chronic mild back pain. Her ear infection on the right side has been bothering her a little bit more so she is using some eardrops. No psychiatric change HPI: Patient receives maintenance ECT for long-term maintenance prevention of severe depression  Past Medical History  Diagnosis Date  . Hypertension   . Hyperlipemia 07/29/14  . Chronic low back pain 07/29/14  . Spinal stenosis 07/29/14  . GERD (gastroesophageal reflux disease)   . Depression   . Esophageal spasm 07/29/14  . Bilateral carpal tunnel syndrome 07/27/14  . Degenerative arthritis of lumbar spine 07/27/14    Past Surgical History  Procedure Laterality Date  . Hip surgery Right   . Replacement total knee bilateral Bilateral 07/27/14  . Inner ear surgery Right     Family History  Problem Relation Age of Onset  . Family history unknown: Yes   Social History:  reports that she quit smoking about 39 years ago. Her smoking use included Cigarettes. She has never used smokeless tobacco. She reports that she does not drink alcohol or use illicit drugs.  Allergies:  Allergies  Allergen Reactions  . Asa [Aspirin]   . Sulfa Antibiotics      (Not in a hospital admission)  No results found for this or any previous visit (from the past 48 hour(s)). No results found.  Review of Systems  Constitutional: Negative.   HENT: Negative.   Eyes: Negative.   Respiratory: Negative.   Cardiovascular: Negative.   Gastrointestinal: Negative.   Musculoskeletal: Negative.   Skin: Negative.   Neurological: Negative.   Psychiatric/Behavioral: Negative.     Blood pressure 122/54, pulse 89, temperature 98.1 F (36.7 C), resp. rate 18, height 5' (1.524 m), weight 77.565 kg (171 lb), SpO2 98 %. Physical Exam  Nursing note and vitals reviewed. Constitutional: She appears well-developed and well-nourished.  HENT:  Head: Normocephalic and atraumatic.  Eyes: Conjunctivae  are normal. Pupils are equal, round, and reactive to light.  Neck: Normal range of motion.  Cardiovascular: Normal rate, regular rhythm and normal heart sounds.   Respiratory: Effort normal and breath sounds normal. No respiratory distress.  GI: Soft.  Musculoskeletal: Normal range of motion.  Neurological: She is alert.  Skin: Skin is warm and dry.  Psychiatric: She has a normal mood and affect. Her behavior is normal. Judgment and thought content normal. Cognition and memory are normal.     Assessment/Plan Patient continues to be stable with current treatment plan. We will be seeing her again in 2 weeks. Patient agrees to the plan.  Azia Toutant 03/03/2015, 10:57 AM

## 2015-03-03 NOTE — Anesthesia Preprocedure Evaluation (Signed)
Anesthesia Evaluation  Patient identified by MRN, date of birth, ID band Patient awake    Reviewed: Allergy & Precautions, NPO status , Patient's Chart, lab work & pertinent test results  Airway Mallampati: II  TM Distance: >3 FB Neck ROM: Limited    Dental  (+) Upper Dentures   Pulmonary former smoker,    Pulmonary exam normal        Cardiovascular Exercise Tolerance: Poor hypertension, Pt. on medications Normal cardiovascular exam     Neuro/Psych Depression    GI/Hepatic GERD  Medicated and Controlled,  Endo/Other    Renal/GU      Musculoskeletal   Abdominal (+) + obese,   Peds  Hematology   Anesthesia Other Findings   Reproductive/Obstetrics                             Anesthesia Physical  Anesthesia Plan  ASA: III  Anesthesia Plan: General   Post-op Pain Management:    Induction: Intravenous  Airway Management Planned: Mask  Additional Equipment:   Intra-op Plan:   Post-operative Plan:   Informed Consent: I have reviewed the patients History and Physical, chart, labs and discussed the procedure including the risks, benefits and alternatives for the proposed anesthesia with the patient or authorized representative who has indicated his/her understanding and acceptance.     Plan Discussed with: CRNA  Anesthesia Plan Comments:         Anesthesia Quick Evaluation

## 2015-03-03 NOTE — Transfer of Care (Signed)
Immediate Anesthesia Transfer of Care Note  Patient: Jenna Dudley  Procedure(s) Performed: ECT  Patient Location: PACU  Anesthesia Type:General  Level of Consciousness: sedated  Airway & Oxygen Therapy: Patient Spontanous Breathing and Patient connected to face mask oxygen  Post-op Assessment: Report given to RN  Post vital signs: Reviewed and stable  Last Vitals:  Filed Vitals:   03/03/15 0929 03/03/15 1117  BP: 122/54 142/56  Pulse:  78  Temp:  99.16F  Resp:  18    Complications: No apparent anesthesia complications

## 2015-03-03 NOTE — Anesthesia Postprocedure Evaluation (Signed)
Anesthesia Post Note  Patient: Jenna Dudley  Procedure(s) Performed: * No procedures listed *  Patient location during evaluation: PACU Anesthesia Type: General Level of consciousness: awake and alert Pain management: pain level controlled Vital Signs Assessment: post-procedure vital signs reviewed and stable Respiratory status: spontaneous breathing and respiratory function stable Cardiovascular status: blood pressure returned to baseline and stable Anesthetic complications: no    Last Vitals:  Filed Vitals:   03/03/15 0925 03/03/15 0929  BP:  122/54  Pulse: 89   Temp: 36.7 C   Resp: 18     Last Pain:  Filed Vitals:   03/03/15 0930  PainSc: 4                  Nathalie Cavendish K

## 2015-03-11 NOTE — Progress Notes (Signed)
[name] MD Progress Note  03/11/2015 1:25 PM Jenna Dudley  MRN:  [id number] Subjective:  Follow-up note for 79 year old woman with recurrent severe psychotic depression. I see her for medication management but also see her regularly for maintenance ECT. Patient had no complaints during our visit. Currently she is doing well with stable mood. No suicidal thoughts no delusions no evidence of psychosis. Tolerating medicines well. Has chronic back pain which is being managed appropriately. Getting along very well with her husband and tolerating ECT well. Principal Problem: @ Diagnosis:   Patient Active Problem List   Diagnosis Date Noted  . Severe recurrent major depression without psychotic features (HCC) [F33.2]   . Major depressive disorder, recurrent episode, severe, with psychotic behavior (HCC) [F33.3] 10/13/2014  . Severe episode of recurrent major depressive disorder, with psychotic features (HCC) [F33.3] 10/13/2014  . Major depressive disorder, 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 features (HCC) [F33.2] 10/06/2014  . Infection of urinary tract [N39.0] 09/17/2014  . Essential hypertension [I10] 09/17/2014  . Chronic back pain [M54.9, G89.29] 09/17/2014  . Gastric reflux [K21.9] 09/17/2014  . History of stroke [Z86.73] 09/17/2014  . Hypertension [I10] 09/16/2014  . Severe recurrent major depression with psychotic features (HCC) [F33.3]   . 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 Hammer toe [M20.40] 09/12/2013  . Foot pain [M79.673] 09/12/2013  . Fungal infection of nail [B35.1] 09/12/2013  . Carpal tunnel syndrome [G56.00] 12/21/2010  . Chronic LBP [M54.5, G89.29] 12/21/2010  . Degenerative arthritis of lumbar spine [M47.816] 12/21/2010  . Clinical depression [F32.9] 12/21/2010  . 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 HLD (hyperlipidemia) [E78.5] 12/21/2010  . BP (high blood pressure) [I10] 12/21/2010  . Acid reflux [K21.9] 12/21/2010  . Spinal stenosis  [M48.00] 12/21/2010   Total Time spent with patient: 25 minutes  Past Psychiatric History: Past history of several admissions to the hospital with psychotic depression which has responded well mostly to ECT but also antipsychotic medicine.  Past Medical History:  Past Medical History  Diagnosis Date  . Hypertension   . Hyperlipemia 07/29/14  . Chronic low back pain 07/29/14  . Spinal stenosis 07/29/14  . GERD (gastroesophageal reflux disease)   . Depression   . Esophageal spasm 07/29/14  . Bilateral carpal tunnel syndrome 07/27/14  . Degenerative arthritis of lumbar spine 07/27/14    Past Surgical History  Procedure Laterality Date  . Hip surgery Right   . Replacement total knee bilateral Bilateral 07/27/14  . Inner ear surgery Right    Family History:  Family History  Problem Relation Age of Onset  . Family history unknown: Yes   Family Psychiatric  History: Family history positive just for more minor anxiety problems Social History:  History  Alcohol Use No     History  Drug Use No    Social History   Social History  . Marital Status: Married    Spouse Name: N/A  . Number of Children: N/A  . Years of Education: N/A   Social History Main Topics  . Smoking status: Former Smoker    Types: Cigarettes    Quit date: 07/29/1975  . Smokeless tobacco: Never Used  . Alcohol Use: No  . Drug Use: No  . Sexual Activity: No     Comment: postmenopause   Other Topics Concern  . None   Social History Narrative   Additional Social History:  Sleep: Good  Appetite:  Good  Current Medications: Current Outpatient Prescriptions  Medication Sig Dispense Refill  . acetaminophen-codeine (TYLENOL #3) 300-30 MG per tablet Take 1 tablet by mouth every 8 (eight) hours as needed for moderate pain or severe pain.    . hydrOXYzine (ATARAX/VISTARIL) 50 MG tablet Take 1 tablet (50 mg total) by mouth 3 (three) times daily as needed for anxiety. 30 tablet 0  .  losartan (COZAAR) 25 MG tablet Take 1 tablet (25 mg total) by mouth daily. 30 tablet 0  . mirtazapine (REMERON) 45 MG tablet Take 1 tablet (45 mg total) by mouth at bedtime. 30 tablet 6  . OLANZapine (ZYPREXA) 20 MG tablet Take 1 tablet (20 mg total) by mouth at bedtime. 30 tablet 6  . omeprazole (PRILOSEC) 20 MG capsule Take 20 mg by mouth 2 (two) times daily before a meal.     . potassium chloride SA (K-DUR,KLOR-CON) 20 MEQ tablet Take 20 mEq by mouth daily.    . QUEtiapine (SEROQUEL) 300 MG tablet Take 1 tablet (300 mg total) by mouth at bedtime. 30 tablet 6  . sertraline (ZOLOFT) 100 MG tablet Take 2 tablets (200 mg total) by mouth daily. 2 tabs in the am 180 tablet 6  . simvastatin (ZOCOR) 40 MG tablet Take 40 mg by mouth at bedtime.     Marland Kitchen losartan-hydrochlorothiazide (HYZAAR) 50-12.5 MG per tablet TAKE 1 TABLET BY MOUTH ONCE DAILY.     No current facility-administered medications for this visit.   Facility-Administered Medications Ordered in Other Visits  Medication Dose Route Frequency Provider Last Rate Last Dose  . labetalol (NORMODYNE,TRANDATE) injection 20 mg  20 mg Intravenous Once Audery Amel, MD      . labetalol (NORMODYNE,TRANDATE) injection 20 mg  20 mg Intravenous Once Audery Amel, MD      . lactated ringers infusion   Intravenous Continuous Audery Amel, MD      . methohexital Sodium 60 mg  60 mg Intravenous Once Audery Amel, MD      . methohexital Sodium 60 mg  60 mg Intravenous Once Audery Amel, MD      . succinylcholine (ANECTINE) injection 80 mg  80 mg Intravenous Once Audery Amel, MD      . succinylcholine (ANECTINE) injection 80 mg  80 mg Intravenous Once Audery Amel, MD        Lab Results: No results found for this or any previous visit (from the past 48 hour(s)).  Physical Findings: AIMS:  , ,  ,  ,    CIWA:    COWS:     Musculoskeletal: Strength & Muscle Tone: within normal limits Gait & Station: ataxic Patient leans: N/A  Psychiatric  Specialty Exam: ROS  Blood pressure 124/82, pulse 98, temperature 97.9 F (36.6 C), temperature source Tympanic, height 5' (1.524 m), weight 170 lb 9.6 oz (77.384 kg), SpO2 96 %.Body mass index is 33.32 kg/(m^2).  General Appearance: Casual  Eye Contact::  Good  Speech:  Slow  Volume:  Normal  Mood:  Euthymic  Affect:  Appropriate  Thought Process:  Goal Directed  Orientation:  Full (Time, Place, and Person)  Thought Content:  Negative  Suicidal Thoughts:  No  Homicidal Thoughts:  No  Memory:  Immediate;   Good Recent;   Fair Remote;   Fair  Judgement:  Fair  Insight:  Fair  Psychomotor Activity:  Decreased  Concentration:  Fair  Recall:  [employer] of Knowledge:Fair  Language: Fair  Akathisia:  No  Handed:  Right  AIMS (if indicated):     Assets:  Communication Skills Desire for Improvement Financial Resources/Insurance Housing Intimacy Resilience Social Support  ADL's:  Intact  Cognition: WNL  Sleep:      Treatment Plan Summary: Medication management and Plan Patient is currently doing quite well. Stable. Although she is on fairly high doses of a couple antipsychotics and antidepressant she is tolerating them well without significant side effects. We follow her for maintenance ECT treatment which has made a huge difference for her. Reviewed side effects with the patient. Reviewed overall plan. No change to treatment. I can see her back for medicine management in 6 months.  Inetha Maret 03/11/2015, 1:25 PM

## 2015-03-17 ENCOUNTER — Telehealth: Payer: Self-pay | Admitting: *Deleted

## 2015-03-23 ENCOUNTER — Ambulatory Visit (INDEPENDENT_AMBULATORY_CARE_PROVIDER_SITE_OTHER): Payer: Medicare Other | Admitting: Psychiatry

## 2015-03-23 ENCOUNTER — Encounter: Payer: Self-pay | Admitting: Psychiatry

## 2015-03-23 VITALS — BP 138/78 | HR 104 | Temp 98.7°F | Ht 60.0 in | Wt 170.2 lb

## 2015-03-23 DIAGNOSIS — F333 Major depressive disorder, recurrent, severe with psychotic symptoms: Secondary | ICD-10-CM

## 2015-03-23 NOTE — Progress Notes (Signed)
[facility] Note  03/23/2015 3:07 PM Jenna Dudley  MRN:  [id number] Subjective:  Follow-up for this 79 year old woman with chronic recurrent severe psychotic depression. Patient is normally followed with ECT which she benefits from greatly. She had refused her last ECT treatment apparently out of discomfort with something that happened during her last procedure. I remain unclear as to what she is complaining about as I do not recall anything out of the ordinary happening. Patient does show some signs of possible oncoming depression. She is talking about feeling bad about herself and people not liking her. This is in part however due to having to deal with her unpleasant daughter Jenna Dudley over the Christmas holiday. She does not report suicidal thoughts. She is not yet frankly psychotic. Principal Problem: @PPROB @ Diagnosis:   Patient Active Problem List   Diagnosis Date Noted  . Severe recurrent major depression without psychotic features (HCC) [F33.2]   . Major depressive disorder, recurrent episode, severe, with psychotic behavior (HCC) [F33.3] 10/13/2014  . Severe episode of recurrent major depressive disorder, with psychotic features (HCC) [F33.3] 10/13/2014  . Major depressive disorder, 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 features (HCC) [F33.2] 10/06/2014  . Infection of urinary tract [N39.0] 09/17/2014  . Essential hypertension [I10] 09/17/2014  . Chronic back pain [M54.9, G89.29] 09/17/2014  . Gastric reflux [K21.9] 09/17/2014  . History of stroke [Z86.73] 09/17/2014  . Hypertension [I10] 09/16/2014  . Severe recurrent major depression with psychotic features (HCC) [F33.3]   . 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 Hammer toe [M20.40] 09/12/2013  . Foot pain [M79.673] 09/12/2013  . Fungal infection of nail [B35.1] 09/12/2013  . Carpal tunnel syndrome [G56.00] 12/21/2010  . Chronic LBP [M54.5, G89.29] 12/21/2010  . Degenerative arthritis of lumbar spine [M47.816]  12/21/2010  . Clinical depression [F32.9] 12/21/2010  . 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 HLD (hyperlipidemia) [E78.5] 12/21/2010  . BP (high blood pressure) [I10] 12/21/2010  . Acid reflux [K21.9] 12/21/2010  . Spinal stenosis [M48.00] 12/21/2010   Total Time spent with patient: 25 minutes  Past Psychiatric History: Patient has a long history of severe depression with psychotic features and has been hospitalized several times but when she stays compliant with her ECT and medicine she is doing quite well.  Past Medical History:  Past Medical History  Diagnosis Date  . Hypertension   . Hyperlipemia 07/29/14  . Chronic low back pain 07/29/14  . Spinal stenosis 07/29/14  . GERD (gastroesophageal reflux disease)   . Depression   . Esophageal spasm 07/29/14  . Bilateral carpal tunnel syndrome 07/27/14  . Degenerative arthritis of lumbar spine 07/27/14    Past Surgical History  Procedure Laterality Date  . Hip surgery Right   . Replacement total knee bilateral Bilateral 07/27/14  . Inner ear surgery Right    Family History:  Family History  Problem Relation Age of Onset  . Family history unknown: Yes   Family Psychiatric  History: Positive for anxiety and depression Social History:  History  Alcohol Use No     History  Drug Use No    Social History   Social History  . Marital Status: Married    Spouse Name: N/A  . Number of Children: N/A  . Years of Education: N/A   Social History Main Topics  . Smoking status: Former Smoker    Types: Cigarettes    Quit date: 07/29/1975  . Smokeless tobacco: Never Used  . Alcohol Use: No  . Drug Use: No  .  Sexual Activity: No     Comment: postmenopause   Other Topics Concern  . None   Social History Narrative   Additional Social History:                         Sleep: Fair  Appetite:  Good  Current Medications: Current Outpatient Prescriptions  Medication Sig Dispense Refill  . acetaminophen-codeine  (TYLENOL #3) 300-30 MG per tablet Take 1 tablet by mouth every 8 (eight) hours as needed for moderate pain or severe pain.    . hydrOXYzine (ATARAX/VISTARIL) 50 MG tablet Take 1 tablet (50 mg total) by mouth 3 (three) times daily as needed for anxiety. 30 tablet 0  . losartan (COZAAR) 25 MG tablet Take 1 tablet (25 mg total) by mouth daily. 30 tablet 0  . losartan-hydrochlorothiazide (HYZAAR) 50-12.5 MG per tablet TAKE 1 TABLET BY MOUTH ONCE DAILY.    . mirtazapine (REMERON) 45 MG tablet Take 1 tablet (45 mg total) by mouth at bedtime. 30 tablet 6  . OLANZapine (ZYPREXA) 20 MG tablet Take 1 tablet (20 mg total) by mouth at bedtime. 30 tablet 6  . omeprazole (PRILOSEC) 20 MG capsule Take 20 mg by mouth 2 (two) times daily before a meal.     . potassium chloride SA (K-DUR,KLOR-CON) 20 MEQ tablet Take 20 mEq by mouth daily.    . QUEtiapine (SEROQUEL) 300 MG tablet Take 1 tablet (300 mg total) by mouth at bedtime. 30 tablet 6  . sertraline (ZOLOFT) 100 MG tablet Take 2 tablets (200 mg total) by mouth daily. 2 tabs in the am 180 tablet 6  . simvastatin (ZOCOR) 40 MG tablet Take 40 mg by mouth at bedtime.      No current facility-administered medications for this visit.   Facility-Administered Medications Ordered in Other Visits  Medication Dose Route Frequency Provider Last Rate Last Dose  . labetalol (NORMODYNE,TRANDATE) injection 20 mg  20 mg Intravenous Once Audery Amel, MD      . labetalol (NORMODYNE,TRANDATE) injection 20 mg  20 mg Intravenous Once Audery Amel, MD      . lactated ringers infusion   Intravenous Continuous Audery Amel, MD      . methohexital Sodium 60 mg  60 mg Intravenous Once Audery Amel, MD      . methohexital Sodium 60 mg  60 mg Intravenous Once Audery Amel, MD      . succinylcholine (ANECTINE) injection 80 mg  80 mg Intravenous Once Audery Amel, MD      . succinylcholine (ANECTINE) injection 80 mg  80 mg Intravenous Once Audery Amel, MD        Lab  Results: No results found for this or any previous visit (from the past 48 hour(s)).  Physical Findings: AIMS:  , ,  ,  ,    CIWA:    COWS:     Musculoskeletal: Strength & Muscle Tone: within normal limits Gait & Station: normal Patient leans: N/A  Psychiatric Specialty Exam: ROS  Blood pressure 138/78, pulse 104, temperature 98.7 F (37.1 C), temperature source Tympanic, height 5' (1.524 m), weight 170 lb 3.2 oz (77.202 kg), SpO2 93 %.Body mass index is 33.24 kg/(m^2).  General Appearance: Casual  Eye Contact::  Fair  Speech:  Slow  Volume:  Normal  Mood:  Anxious  Affect:  Blunt  Thought Process:  Goal Directed  Orientation:  Full (Time, Place, and Person)  Thought Content:  Negative  Suicidal Thoughts:  No  Homicidal Thoughts:  No  Memory:  Immediate;   Good Recent;   Fair Remote;   Fair  Judgement:  Fair  Insight:  Fair  Psychomotor Activity:  Decreased  Concentration:  Fair  Recall:  [employer] of Knowledge:Fair  Language: Fair  Akathisia:  No  Handed:  Right  AIMS (if indicated):     Assets:  Communication Skills Desire for Improvement Financial Resources/Insurance Housing Intimacy Resilience Social Support  ADL's:  Intact  Cognition: WNL  Sleep:      Treatment Plan Summary: Medication management and Plan supportive therapy and explained more about the ECT. Reassured her that there was no complication with the last treatment and that we continued to be confident about the high rate of safety with ECT. I ask her to please address me and come for her treatment tomorrow and she agreed to do so. I have added her on to tomorrow's ECT schedule. Otherwise no changed any of her psychiatric medicines. We can follow-up in the office as scheduled in about 6 months or sooner if she ever wants to again.  John Clapacs 03/23/2015, 3:07 PM

## 2015-03-24 ENCOUNTER — Ambulatory Visit: Admission: RE | Admit: 2015-03-24 | Payer: Self-pay | Source: Ambulatory Visit | Admitting: Psychiatry

## 2015-03-24 ENCOUNTER — Encounter: Payer: Self-pay | Admitting: Certified Registered Nurse Anesthetist

## 2015-03-24 ENCOUNTER — Encounter (HOSPITAL_BASED_OUTPATIENT_CLINIC_OR_DEPARTMENT_OTHER)
Admission: RE | Admit: 2015-03-24 | Discharge: 2015-03-24 | Disposition: A | Discharge status: Home / Self Care | Payer: Medicare Other | Source: Ambulatory Visit | Attending: Psychiatry | Admitting: Psychiatry

## 2015-03-24 DIAGNOSIS — F333 Major depressive disorder, recurrent, severe with psychotic symptoms: Secondary | ICD-10-CM | POA: Diagnosis not present

## 2015-03-24 DIAGNOSIS — F329 Major depressive disorder, single episode, unspecified: Secondary | ICD-10-CM | POA: Diagnosis not present

## 2015-03-24 MED ORDER — KETOROLAC TROMETHAMINE 30 MG/ML IJ SOLN
30.0000 mg | Freq: Once | INTRAMUSCULAR | Status: AC
  Administered 2015-03-24: 30 mg via INTRAVENOUS

## 2015-03-24 MED ORDER — METHOHEXITAL SODIUM 100 MG/10ML IV SOSY
60.0000 mg | PREFILLED_SYRINGE | Freq: Once | INTRAVENOUS | Status: AC
Start: 2015-03-24 — End: 2015-03-24
  Administered 2015-03-24: 60 mg via INTRAVENOUS

## 2015-03-24 MED ORDER — SUCCINYLCHOLINE CHLORIDE 20 MG/ML IJ SOLN
80.0000 mg | Freq: Once | INTRAMUSCULAR | Status: AC
  Administered 2015-03-24: 80 mg via INTRAVENOUS

## 2015-03-24 MED ORDER — DEXTROSE 5 % IV SOLN
250.0000 mL | Freq: Once | INTRAVENOUS | Status: AC
Start: 2015-03-24 — End: 2015-03-24
  Administered 2015-03-24: 500 mL via INTRAVENOUS

## 2015-03-24 MED ORDER — LABETALOL HCL 5 MG/ML IV SOLN
20.0000 mg | Freq: Once | INTRAVENOUS | Status: AC
  Administered 2015-03-24: 20 mg via INTRAVENOUS

## 2015-03-24 MED ORDER — SUCCINYLCHOLINE CHLORIDE 20 MG/ML IJ SOLN: 90.0000 mg | Freq: Once | INTRAMUSCULAR | Status: DC

## 2015-03-24 NOTE — Procedures (Signed)
ECT SERVICES Physician's Interval Evaluation & Treatment Note  Patient Identification: Jenna Dudley MRN:  [id number] Date of Evaluation:  03/24/2015 TX #: [id number]  MADRS: 20  MMSE: 23  P.E. Findings:  No change to physical exam continues to have chronic back pain lungs and heart normal  Psychiatric Interval Note:  A little more nervous and down but no suicidal thoughts or psychosis. Starting to seem like she may be having a little more memory problem long-term  Subjective:  Patient is a 79 y.o. female seen for evaluation for Electroconvulsive Therapy. No real subjective complaints  Treatment Summary:   [x]   Right Unilateral             []  Bilateral   % Energy : 0.3 ms 100%   Impedance: 1550 ohms  Seizure Energy Index: 3028 V squared  Postictal Suppression Index: 63%  Seizure Concordance Index: 83%  Medications  Pre Shock: Labetalol 20 mg, Toradol 30 mg, Brevital 60 mg, succinylcholine 80 mg  Post Shock: None  Seizure Duration: 14 seconds by EMG, 45 seconds by EEG   Comments: Return in 2 weeks on January 11   Lungs:  [x]   Clear to auscultation               []  Other:   Heart:    [x]   Regular rhythm             []  irregular rhythm    [x]   Previous H&P reviewed, patient examined and there are NO CHANGES                 []   Previous H&P reviewed, patient examined and there are changes noted.   Mordecai RasmussenJohn Tu Bayle, MD 12/28/201610:52 AM

## 2015-03-24 NOTE — Anesthesia Preprocedure Evaluation (Signed)
Anesthesia Evaluation  Patient identified by MRN, date of birth, ID band Patient awake    Reviewed: Allergy & Precautions, H&P , NPO status , Patient's Chart, lab work & pertinent test results, reviewed documented beta blocker date and time   Airway Mallampati: II  TM Distance: >3 FB Neck ROM: full    Dental no notable dental hx. (+) Teeth Intact   Pulmonary neg pulmonary ROS, former smoker,    Pulmonary exam normal breath sounds clear to auscultation       Cardiovascular Exercise Tolerance: Good hypertension, negative cardio ROS   Rhythm:regular Rate:Normal     Neuro/Psych PSYCHIATRIC DISORDERS  Neuromuscular disease negative neurological ROS  negative psych ROS   GI/Hepatic negative GI ROS, Neg liver ROS, GERD  ,  Endo/Other  negative endocrine ROS  Renal/GU negative Renal ROS  negative genitourinary   Musculoskeletal   Abdominal   Peds  Hematology negative hematology ROS (+)   Anesthesia Other Findings   Reproductive/Obstetrics negative OB ROS                             Anesthesia Physical Anesthesia Plan  ASA: III  Anesthesia Plan: General   Post-op Pain Management:    Induction:   Airway Management Planned:   Additional Equipment:   Intra-op Plan:   Post-operative Plan:   Informed Consent: I have reviewed the patients History and Physical, chart, labs and discussed the procedure including the risks, benefits and alternatives for the proposed anesthesia with the patient or authorized representative who has indicated his/her understanding and acceptance.   Dental Advisory Given  Plan Discussed with: CRNA  Anesthesia Plan Comments:         Anesthesia Quick Evaluation

## 2015-03-24 NOTE — Transfer of Care (Signed)
Immediate Anesthesia Transfer of Care Note  Patient: Jenna DaftJoan M Soltis  Procedure(s) Performed: * No procedures listed *  Patient Location: PACU  Anesthesia Type:General  Level of Consciousness: sedated  Airway & Oxygen Therapy: Patient Spontanous Breathing and Patient connected to nasal cannula oxygen  Post-op Assessment: Report given to RN and Post -op Vital signs reviewed and stable  Post vital signs: Reviewed and stable  Last Vitals:  Filed Vitals:   03/24/15 0932 03/24/15 1100  BP: 136/65   Pulse: 85   Temp: 35.9 C 37.3 C  Resp: 18     Complications: No apparent anesthesia complications

## 2015-03-24 NOTE — H&P (Signed)
Jenna Dudley is an 79 y.o. female.   Chief Complaint: Patient receiving maintenance treatment for recurrent severe psychotic depression. Has had a little bit of increase in anxiety and depression having missed to treatment and gone through the holidays. Not currently psychotic or suicidal HPI: Long-standing depression which has responded very well to ECT but with a tendency to relapse quickly if she misses treatment or has a lot of stress. Medically she has chronic back pain is a primary issue  Past Medical History  Diagnosis Date  . Hypertension   . Hyperlipemia 07/29/14  . Chronic low back pain 07/29/14  . Spinal stenosis 07/29/14  . GERD (gastroesophageal reflux disease)   . Depression   . Esophageal spasm 07/29/14  . Bilateral carpal tunnel syndrome 07/27/14  . Degenerative arthritis of lumbar spine 07/27/14    Past Surgical History  Procedure Laterality Date  . Hip surgery Right   . Replacement total knee bilateral Bilateral 07/27/14  . Inner ear surgery Right     Family History  Problem Relation Age of Onset  . Family history unknown: Yes   Social History:  reports that she quit smoking about 39 years ago. Her smoking use included Cigarettes. She has never used smokeless tobacco. She reports that she does not drink alcohol or use illicit drugs.  Allergies:  Allergies  Allergen Reactions  . Asa [Aspirin]   . Sulfa Antibiotics      (Not in a hospital admission)  No results found for this or any previous visit (from the past 48 hour(s)). No results found.  Review of Systems  Constitutional: Negative.   HENT: Negative.   Eyes: Negative.   Respiratory: Negative.   Cardiovascular: Negative.   Gastrointestinal: Negative.   Musculoskeletal: Positive for back pain.  Skin: Negative.   Neurological: Negative.   Psychiatric/Behavioral: Positive for memory loss. Negative for depression, suicidal ideas, hallucinations and substance abuse. The patient is nervous/anxious. The patient  does not have insomnia.     Blood pressure 136/65, pulse 85, temperature 96.7 F (35.9 C), temperature source Oral, resp. rate 18, weight 76.658 kg (169 lb), SpO2 95 %. Physical Exam  Nursing note and vitals reviewed. Constitutional: She appears well-developed and well-nourished.  HENT:  Head: Normocephalic and atraumatic.  Eyes: Conjunctivae are normal. Pupils are equal, round, and reactive to light.  Neck: Normal range of motion.  Cardiovascular: Normal rate, regular rhythm and normal heart sounds.   Respiratory: Effort normal and breath sounds normal. No respiratory distress.  GI: Soft.  Musculoskeletal: Normal range of motion.  Neurological: She is alert.  Skin: Skin is warm and dry.  Psychiatric: Judgment and thought content normal. Her mood appears anxious. Her speech is delayed. She is slowed. She exhibits abnormal recent memory.     Assessment/Plan Patient to receive ECT treatment today in follow-up in 2 weeks as usual. She agrees to the plan.  John Clapacs 03/24/2015, 10:50 AM

## 2015-03-24 NOTE — Anesthesia Procedure Notes (Signed)
Performed by: Ellanora Rayborn Pre-anesthesia Checklist: Patient identified, Patient being monitored, Timeout performed, Emergency Drugs available and Suction available Patient Re-evaluated:Patient Re-evaluated prior to inductionOxygen Delivery Method: Circle system utilized Preoxygenation: Pre-oxygenation with 100% oxygen Ventilation: Mask ventilation without difficulty Airway Equipment and Method: Bite block Dental Injury: Teeth and Oropharynx as per pre-operative assessment        

## 2015-03-25 NOTE — Anesthesia Postprocedure Evaluation (Signed)
Anesthesia Post Note  Patient: Jenna DaftJoan M Rana  Procedure(s) Performed: * No procedures listed *  Patient location during evaluation: PACU Anesthesia Type: General Level of consciousness: awake and alert Pain management: pain level controlled Vital Signs Assessment: post-procedure vital signs reviewed and stable Respiratory status: spontaneous breathing, nonlabored ventilation, respiratory function stable and patient connected to nasal cannula oxygen Cardiovascular status: blood pressure returned to baseline and stable Postop Assessment: no signs of nausea or vomiting Anesthetic complications: no    Last Vitals:  Filed Vitals:   03/24/15 1148 03/24/15 1151  BP:  133/60  Pulse: 76   Temp:    Resp: 16     Last Pain:  Filed Vitals:   03/24/15 1151  PainSc: 0-No pain                 [name]

## 2015-04-05 ENCOUNTER — Ambulatory Visit: Payer: Medicare Other | Admitting: Psychiatry

## 2015-04-07 ENCOUNTER — Inpatient Hospital Stay: Admission: RE | Admit: 2015-04-07 | Payer: Medicare Other | Source: Ambulatory Visit

## 2015-04-07 ENCOUNTER — Telehealth: Payer: Self-pay | Admitting: *Deleted

## 2015-05-05 ENCOUNTER — Telehealth: Payer: Self-pay | Admitting: *Deleted

## 2015-06-02 ENCOUNTER — Telehealth: Payer: Self-pay | Admitting: Psychiatry

## 2015-06-07 NOTE — Telephone Encounter (Signed)
Thanks for the update

## 2015-08-03 ENCOUNTER — Ambulatory Visit: Payer: Medicare Other | Admitting: Psychiatry

## 2015-08-12 ENCOUNTER — Telehealth: Payer: Self-pay | Admitting: *Deleted

## 2015-10-03 ENCOUNTER — Encounter: Payer: Self-pay | Admitting: Emergency Medicine

## 2015-10-03 ENCOUNTER — Emergency Department: Payer: Medicare Other

## 2015-10-03 ENCOUNTER — Inpatient Hospital Stay
Admission: EM | Admit: 2015-10-03 | Discharge: 2015-10-15 | DRG: 885 | Disposition: A | Discharge status: Home / Self Care | Payer: Medicare Other | Attending: Internal Medicine | Admitting: Internal Medicine

## 2015-10-03 DIAGNOSIS — Z818 Family history of other mental and behavioral disorders: Secondary | ICD-10-CM | POA: Diagnosis not present

## 2015-10-03 DIAGNOSIS — Z9119 Patient's noncompliance with other medical treatment and regimen: Secondary | ICD-10-CM | POA: Diagnosis not present

## 2015-10-03 DIAGNOSIS — F3160 Bipolar disorder, current episode mixed, unspecified: Secondary | ICD-10-CM | POA: Diagnosis present

## 2015-10-03 DIAGNOSIS — A419 Sepsis, unspecified organism: Secondary | ICD-10-CM

## 2015-10-03 DIAGNOSIS — E872 Acidosis, unspecified: Secondary | ICD-10-CM

## 2015-10-03 DIAGNOSIS — E876 Hypokalemia: Secondary | ICD-10-CM | POA: Diagnosis not present

## 2015-10-03 DIAGNOSIS — N390 Urinary tract infection, site not specified: Secondary | ICD-10-CM

## 2015-10-03 DIAGNOSIS — N10 Acute tubulo-interstitial nephritis: Secondary | ICD-10-CM | POA: Diagnosis present

## 2015-10-03 DIAGNOSIS — G9341 Metabolic encephalopathy: Secondary | ICD-10-CM | POA: Diagnosis present

## 2015-10-03 DIAGNOSIS — G9340 Encephalopathy, unspecified: Secondary | ICD-10-CM | POA: Diagnosis present

## 2015-10-03 DIAGNOSIS — F333 Major depressive disorder, recurrent, severe with psychotic symptoms: Secondary | ICD-10-CM | POA: Diagnosis present

## 2015-10-03 DIAGNOSIS — Z79899 Other long term (current) drug therapy: Secondary | ICD-10-CM | POA: Diagnosis not present

## 2015-10-03 DIAGNOSIS — Z87891 Personal history of nicotine dependence: Secondary | ICD-10-CM | POA: Diagnosis not present

## 2015-10-03 DIAGNOSIS — Z96653 Presence of artificial knee joint, bilateral: Secondary | ICD-10-CM | POA: Diagnosis present

## 2015-10-03 DIAGNOSIS — M47816 Spondylosis without myelopathy or radiculopathy, lumbar region: Secondary | ICD-10-CM | POA: Diagnosis present

## 2015-10-03 DIAGNOSIS — E870 Hyperosmolality and hypernatremia: Secondary | ICD-10-CM | POA: Diagnosis present

## 2015-10-03 DIAGNOSIS — M545 Low back pain: Secondary | ICD-10-CM | POA: Diagnosis present

## 2015-10-03 DIAGNOSIS — R6520 Severe sepsis without septic shock: Secondary | ICD-10-CM

## 2015-10-03 DIAGNOSIS — R451 Restlessness and agitation: Secondary | ICD-10-CM

## 2015-10-03 DIAGNOSIS — N289 Disorder of kidney and ureter, unspecified: Secondary | ICD-10-CM

## 2015-10-03 DIAGNOSIS — I10 Essential (primary) hypertension: Secondary | ICD-10-CM | POA: Diagnosis present

## 2015-10-03 DIAGNOSIS — B9620 Unspecified Escherichia coli [E. coli] as the cause of diseases classified elsewhere: Secondary | ICD-10-CM | POA: Diagnosis present

## 2015-10-03 DIAGNOSIS — N170 Acute kidney failure with tubular necrosis: Secondary | ICD-10-CM | POA: Diagnosis present

## 2015-10-03 DIAGNOSIS — D72829 Elevated white blood cell count, unspecified: Secondary | ICD-10-CM

## 2015-10-03 DIAGNOSIS — R4182 Altered mental status, unspecified: Secondary | ICD-10-CM | POA: Diagnosis present

## 2015-10-03 DIAGNOSIS — F23 Brief psychotic disorder: Secondary | ICD-10-CM

## 2015-10-03 DIAGNOSIS — G8929 Other chronic pain: Secondary | ICD-10-CM | POA: Diagnosis present

## 2015-10-03 DIAGNOSIS — E785 Hyperlipidemia, unspecified: Secondary | ICD-10-CM | POA: Diagnosis present

## 2015-10-03 DIAGNOSIS — K219 Gastro-esophageal reflux disease without esophagitis: Secondary | ICD-10-CM | POA: Diagnosis present

## 2015-10-03 LAB — BASIC METABOLIC PANEL
Anion gap: 9 (ref 5–15)
BUN: 30 mg/dL — AB (ref 6–20)
CALCIUM: 8.6 mg/dL — AB (ref 8.9–10.3)
CO2: 20 mmol/L — ABNORMAL LOW (ref 22–32)
CREATININE: 0.84 mg/dL (ref 0.44–1.00)
Chloride: 120 mmol/L — ABNORMAL HIGH (ref 101–111)
GFR calc Af Amer: >60 mL/min (ref >60)
Glucose, Bld: 141 mg/dL — ABNORMAL HIGH (ref 65–99)
Potassium: 3.6 mmol/L (ref 3.5–5.1)
SODIUM: 149 mmol/L — AB (ref 135–145)

## 2015-10-03 LAB — CBC WITH DIFFERENTIAL/PLATELET
Basophils Absolute: 0.2 10*3/uL — ABNORMAL HIGH (ref 0–0.1)
Basophils Relative: 2 %
EOS ABS: 0 10*3/uL (ref 0–0.7)
EOS PCT: 0 %
HCT: 32.7 % — ABNORMAL LOW (ref 35.0–47.0)
Hemoglobin: 11.3 g/dL — ABNORMAL LOW (ref 12.0–16.0)
LYMPHS ABS: 1.6 10*3/uL (ref 1.0–3.6)
Lymphocytes Relative: 11 %
MCH: 30.4 pg (ref 26.0–34.0)
MCHC: 34.5 g/dL (ref 32.0–36.0)
MCV: 88.2 fL (ref 80.0–100.0)
MONO ABS: 1.5 10*3/uL — AB (ref 0.2–0.9)
MONOS PCT: 10 %
Neutro Abs: 11.8 10*3/uL — ABNORMAL HIGH (ref 1.4–6.5)
Neutrophils Relative %: 77 %
PLATELETS: 268 10*3/uL (ref 150–440)
RBC: 3.71 MIL/uL — AB (ref 3.80–5.20)
RDW: 14.5 % (ref 11.5–14.5)
WBC: 15.2 10*3/uL — AB (ref 3.6–11.0)

## 2015-10-03 LAB — TROPONIN I
TROPONIN I: 0.16 ng/mL — AB (ref <0.03)
TROPONIN I: 0.12 ng/mL — AB (ref <0.03)
Troponin I: 0.16 ng/mL (ref <0.03)

## 2015-10-03 LAB — CBC
HEMATOCRIT: 31.3 % — AB (ref 35.0–47.0)
Hemoglobin: 10.6 g/dL — ABNORMAL LOW (ref 12.0–16.0)
MCH: 30.5 pg (ref 26.0–34.0)
MCHC: 33.7 g/dL (ref 32.0–36.0)
MCV: 90.5 fL (ref 80.0–100.0)
Platelets: 207 10*3/uL (ref 150–440)
RBC: 3.46 MIL/uL — ABNORMAL LOW (ref 3.80–5.20)
RDW: 14.3 % (ref 11.5–14.5)
WBC: 11.5 10*3/uL — ABNORMAL HIGH (ref 3.6–11.0)

## 2015-10-03 LAB — URINALYSIS COMPLETE WITH MICROSCOPIC (ARMC ONLY)
BILIRUBIN URINE: NEGATIVE
GLUCOSE, UA: NEGATIVE mg/dL
Nitrite: NEGATIVE
Protein, ur: 100 mg/dL — AB
Specific Gravity, Urine: 1.024 (ref 1.005–1.030)
pH: 5 (ref 5.0–8.0)

## 2015-10-03 LAB — SALICYLATE LEVEL

## 2015-10-03 LAB — SODIUM: Sodium: 146 mmol/L — ABNORMAL HIGH (ref 135–145)

## 2015-10-03 LAB — COMPREHENSIVE METABOLIC PANEL
ALT: 20 U/L (ref 14–54)
ANION GAP: 13 (ref 5–15)
AST: 65 U/L — ABNORMAL HIGH (ref 15–41)
Albumin: 5 g/dL (ref 3.5–5.0)
Alkaline Phosphatase: 80 U/L (ref 38–126)
BUN: 34 mg/dL — ABNORMAL HIGH (ref 6–20)
CHLORIDE: 117 mmol/L — AB (ref 101–111)
CO2: 19 mmol/L — AB (ref 22–32)
Calcium: 9.3 mg/dL (ref 8.9–10.3)
Creatinine, Ser: 1.01 mg/dL — ABNORMAL HIGH (ref 0.44–1.00)
GFR calc non Af Amer: 52 mL/min — ABNORMAL LOW (ref >60)
Glucose, Bld: 99 mg/dL (ref 65–99)
Potassium: 3.8 mmol/L (ref 3.5–5.1)
SODIUM: 149 mmol/L — AB (ref 135–145)
Total Bilirubin: 1 mg/dL (ref 0.3–1.2)
Total Protein: 8.2 g/dL — ABNORMAL HIGH (ref 6.5–8.1)

## 2015-10-03 LAB — TSH: TSH: 1.261 u[IU]/mL (ref 0.350–4.500)

## 2015-10-03 LAB — MRSA PCR SCREENING: MRSA BY PCR: NEGATIVE

## 2015-10-03 LAB — LACTIC ACID, PLASMA
LACTIC ACID, VENOUS: 2.8 mmol/L — AB (ref 0.5–1.9)
Lactic Acid, Venous: 0.7 mmol/L (ref 0.5–1.9)

## 2015-10-03 LAB — BETA-HYDROXYBUTYRIC ACID: BETA-HYDROXYBUTYRIC ACID: 0.27 mmol/L (ref 0.05–0.27)

## 2015-10-03 LAB — GLUCOSE, CAPILLARY: Glucose-Capillary: 110 mg/dL — ABNORMAL HIGH (ref 65–99)

## 2015-10-03 LAB — ETHANOL

## 2015-10-03 LAB — ACETAMINOPHEN LEVEL: Acetaminophen (Tylenol), Serum: <10 ug/mL — ABNORMAL LOW (ref 10–30)

## 2015-10-03 LAB — MAGNESIUM: MAGNESIUM: 2.1 mg/dL (ref 1.7–2.4)

## 2015-10-03 LAB — AMMONIA: Ammonia: 29 umol/L (ref 9–35)

## 2015-10-03 MED ORDER — QUETIAPINE FUMARATE 100 MG PO TABS
300.0000 mg | ORAL_TABLET | Freq: Every day | ORAL | Status: DC
  Administered 2015-10-04 – 2015-10-14 (×6): 300 mg via ORAL
  Filled 2015-10-03 (×2): qty 3
  Filled 2015-10-03: qty 1
  Filled 2015-10-03 (×6): qty 3

## 2015-10-03 MED ORDER — KETAMINE HCL 50 MG/ML IJ SOLN
INTRAMUSCULAR | Status: AC
  Filled 2015-10-03: qty 10

## 2015-10-03 MED ORDER — SODIUM CHLORIDE 0.9 % IV BOLUS (SEPSIS)
500.0000 mL | Freq: Once | INTRAVENOUS | Status: AC
  Administered 2015-10-03: 500 mL via INTRAVENOUS

## 2015-10-03 MED ORDER — PANTOPRAZOLE SODIUM 40 MG PO TBEC
40.0000 mg | DELAYED_RELEASE_TABLET | Freq: Every day | ORAL | Status: DC
  Administered 2015-10-04 – 2015-10-15 (×5): 40 mg via ORAL
  Filled 2015-10-03 (×8): qty 1

## 2015-10-03 MED ORDER — KETAMINE HCL 50 MG/ML IJ SOLN: 5.0000 mg/kg | Freq: Once | INTRAMUSCULAR | Status: DC

## 2015-10-03 MED ORDER — DEXTROSE 5 % IV SOLN
1.0000 g | Freq: Once | INTRAVENOUS | Status: AC
  Administered 2015-10-03: 1 g via INTRAVENOUS
  Filled 2015-10-03: qty 10

## 2015-10-03 MED ORDER — CETYLPYRIDINIUM CHLORIDE 0.05 % MT LIQD
7.0000 mL | Freq: Two times a day (BID) | OROMUCOSAL | Status: DC
  Administered 2015-10-04: 7 mL via OROMUCOSAL

## 2015-10-03 MED ORDER — MIRTAZAPINE 15 MG PO TABS
45.0000 mg | ORAL_TABLET | Freq: Every day | ORAL | Status: DC
  Administered 2015-10-04: 30 mg via ORAL
  Administered 2015-10-10 – 2015-10-14 (×4): 45 mg via ORAL
  Filled 2015-10-03 (×8): qty 3

## 2015-10-03 MED ORDER — ACETAMINOPHEN 650 MG RE SUPP
650.0000 mg | Freq: Four times a day (QID) | RECTAL | Status: DC | PRN
  Filled 2015-10-03: qty 1

## 2015-10-03 MED ORDER — SERTRALINE HCL 100 MG PO TABS
200.0000 mg | ORAL_TABLET | ORAL | Status: DC
  Administered 2015-10-10 – 2015-10-14 (×2): 200 mg via ORAL
  Filled 2015-10-03 (×4): qty 2

## 2015-10-03 MED ORDER — ACETAMINOPHEN-CODEINE #3 300-30 MG PO TABS
1.0000 | ORAL_TABLET | Freq: Three times a day (TID) | ORAL | Status: DC | PRN
  Administered 2015-10-04: 1 via ORAL
  Filled 2015-10-03 (×3): qty 1

## 2015-10-03 MED ORDER — ENOXAPARIN SODIUM 40 MG/0.4ML ~~LOC~~ SOLN
40.0000 mg | SUBCUTANEOUS | Status: DC
  Administered 2015-10-03 – 2015-10-13 (×4): 40 mg via SUBCUTANEOUS
  Filled 2015-10-03 (×10): qty 0.4

## 2015-10-03 MED ORDER — DEXTROSE 5 % IV SOLN
1.0000 g | INTRAVENOUS | Status: DC
  Administered 2015-10-04 – 2015-10-06 (×3): 1 g via INTRAVENOUS
  Filled 2015-10-03 (×4): qty 10

## 2015-10-03 MED ORDER — DEXMEDETOMIDINE HCL IN NACL 400 MCG/100ML IV SOLN
0.4000 ug/kg/h | INTRAVENOUS | Status: DC
  Administered 2015-10-03: 0.8 ug/kg/h via INTRAVENOUS
  Administered 2015-10-04: 0.4 ug/kg/h via INTRAVENOUS
  Filled 2015-10-03 (×2): qty 100

## 2015-10-03 MED ORDER — POTASSIUM CL IN DEXTROSE 5% 20 MEQ/L IV SOLN: 20.0000 meq | INTRAVENOUS | Status: DC

## 2015-10-03 MED ORDER — POTASSIUM CHLORIDE 2 MEQ/ML IV SOLN
INTRAVENOUS | Status: DC
  Administered 2015-10-03 – 2015-10-04 (×3): via INTRAVENOUS
  Filled 2015-10-03 (×9): qty 1000

## 2015-10-03 MED ORDER — ACETAMINOPHEN 325 MG PO TABS: 650.0000 mg | ORAL_TABLET | Freq: Four times a day (QID) | ORAL | Status: DC | PRN

## 2015-10-03 MED ORDER — CHLORHEXIDINE GLUCONATE 0.12 % MT SOLN
15.0000 mL | Freq: Two times a day (BID) | OROMUCOSAL | Status: DC
  Administered 2015-10-03 – 2015-10-05 (×4): 15 mL via OROMUCOSAL
  Filled 2015-10-03 (×6): qty 15

## 2015-10-03 MED ORDER — ONDANSETRON HCL 4 MG/2ML IJ SOLN: 4.0000 mg | Freq: Four times a day (QID) | INTRAMUSCULAR | Status: DC | PRN

## 2015-10-03 MED ORDER — KETAMINE HCL 50 MG/ML IJ SOLN
INTRAMUSCULAR | Status: AC
  Administered 2015-10-03: 380 mg via INTRAMUSCULAR
  Filled 2015-10-03: qty 10

## 2015-10-03 MED ORDER — KCL IN DEXTROSE-NACL 20-5-0.45 MEQ/L-%-% IV SOLN
INTRAVENOUS | Status: DC
  Administered 2015-10-03: 15:00:00 via INTRAVENOUS
  Filled 2015-10-03 (×4): qty 1000

## 2015-10-03 MED ORDER — OLANZAPINE 10 MG PO TABS
20.0000 mg | ORAL_TABLET | Freq: Every day | ORAL | Status: DC
  Filled 2015-10-03: qty 2

## 2015-10-03 MED ORDER — SODIUM CHLORIDE 0.9% FLUSH
3.0000 mL | Freq: Two times a day (BID) | INTRAVENOUS | Status: DC
  Administered 2015-10-03 – 2015-10-10 (×6): 3 mL via INTRAVENOUS

## 2015-10-03 MED ORDER — ZIPRASIDONE MESYLATE 20 MG IM SOLR
10.0000 mg | Freq: Two times a day (BID) | INTRAMUSCULAR | Status: DC | PRN
  Administered 2015-10-03 – 2015-10-15 (×6): 10 mg via INTRAMUSCULAR
  Filled 2015-10-03 (×9): qty 20

## 2015-10-03 MED ORDER — ONDANSETRON HCL 4 MG PO TABS: 4.0000 mg | ORAL_TABLET | Freq: Four times a day (QID) | ORAL | Status: DC | PRN

## 2015-10-03 MED ORDER — DOCUSATE SODIUM 100 MG PO CAPS
100.0000 mg | ORAL_CAPSULE | Freq: Two times a day (BID) | ORAL | Status: DC
  Administered 2015-10-04 – 2015-10-15 (×2): 100 mg via ORAL
  Filled 2015-10-03 (×10): qty 1

## 2015-10-03 MED ORDER — ASPIRIN 81 MG PO CHEW
81.0000 mg | CHEWABLE_TABLET | Freq: Every day | ORAL | Status: DC
  Administered 2015-10-04: 81 mg via ORAL
  Filled 2015-10-03 (×3): qty 1

## 2015-10-03 MED ORDER — KETAMINE HCL 10 MG/ML IJ SOLN: 5.0000 mg/kg | INTRAMUSCULAR | Status: DC

## 2015-10-03 MED ORDER — SIMVASTATIN 40 MG PO TABS
40.0000 mg | ORAL_TABLET | Freq: Every day | ORAL | Status: DC
  Administered 2015-10-04 – 2015-10-14 (×5): 40 mg via ORAL
  Filled 2015-10-03 (×7): qty 1

## 2015-10-03 NOTE — Progress Notes (Signed)
RN made Dr. Winona LegatoVaickute aware of troponin of 0.16, MD asked RN to add troponin on to this morning's labs that were drawn in ED.

## 2015-10-03 NOTE — ED Provider Notes (Signed)
Time Seen: Approximately 0 933  I have reviewed the triage notes  Chief Complaint: Altered Mental Status   History of Present Illness: Jenna Dudley is a 80 y.o. female *who presents with acute altered mental status. Patient has history of major depressive disorder and receives periodic ECT in chronic medications. Her husband who is the primary historian states that he thinks that she ran out of her medication at least one day ago. Record shows that she is on Seroquel, Zoloft, hydroxyzine, Remeron etc. The patient apparently started developing altered mental status over the last 24-48 hours. She had a near fall this morning and her husband caught her before she can actually fall and injure herself. He states that when she's had this altered mental status before in the past that usually due to either not taking her medications or urinary tract infection. He is not aware of her having any physical complaints and she does have a fever low grade on arrival here to emergency department.  Past Medical History  Diagnosis Date  . Hypertension   . Hyperlipemia 07/29/14  . Chronic low back pain 07/29/14  . Spinal stenosis 07/29/14  . GERD (gastroesophageal reflux disease)   . Depression   . Esophageal spasm 07/29/14  . Bilateral carpal tunnel syndrome 07/27/14  . Degenerative arthritis of lumbar spine 07/27/14    Patient Active Problem List   Diagnosis Date Noted  . Severe recurrent major depression without psychotic features (HCC)   . Major depressive disorder, recurrent episode, severe, with psychotic behavior (HCC) 10/13/2014  . Severe episode of recurrent major depressive disorder, with psychotic features (HCC) 10/13/2014  . 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 Infection of urinary tract 09/17/2014  . Essential hypertension 09/17/2014  . Chronic back pain 09/17/2014  . Gastric reflux 09/17/2014  . History of stroke 09/17/2014  . Hypertension  09/16/2014  . Severe recurrent major depression with psychotic features (HCC)   . Hallux abductovalgus with bunions 09/12/2013  . Hammer toe 09/12/2013  . Foot pain 09/12/2013  . Fungal infection of nail 09/12/2013  . Carpal tunnel syndrome 12/21/2010  . Chronic LBP 12/21/2010  . Degenerative arthritis of lumbar spine 12/21/2010  . Clinical depression 12/21/2010  . 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 HLD (hyperlipidemia) 12/21/2010  . BP (high blood pressure) 12/21/2010  . Acid reflux 12/21/2010  . Spinal stenosis 12/21/2010    Past Surgical History  Procedure Laterality Date  . Hip surgery Right   . Replacement total knee bilateral Bilateral 07/27/14  . Inner ear surgery Right     Past Surgical History  Procedure Laterality Date  . Hip surgery Right   . Replacement total knee bilateral Bilateral 07/27/14  . Inner ear surgery Right     Current Outpatient Rx  Name  Route  Sig  Dispense  Refill  . acetaminophen-codeine (TYLENOL #3) 300-30 MG per tablet   Oral   Take 1 tablet by mouth every 8 (eight) hours as needed for moderate pain or severe pain.         Marland Kitchen losartan (COZAAR) 25 MG tablet   Oral   Take 1 tablet (25 mg total) by mouth daily.   30 tablet   0   . mirtazapine (REMERON) 45 MG tablet   Oral   Take 1 tablet (45 mg total) by mouth at bedtime.   30 tablet   6   . OLANZapine (ZYPREXA) 20 MG tablet   Oral   Take  1 tablet (20 mg total) by mouth at bedtime.   30 tablet   6   . omeprazole (PRILOSEC) 20 MG capsule   Oral   Take 20 mg by mouth 2 (two) times daily before a meal.          . potassium chloride SA (K-DUR,KLOR-CON) 20 MEQ tablet   Oral   Take 20 mEq by mouth daily.         . QUEtiapine (SEROQUEL) 300 MG tablet   Oral   Take 1 tablet (300 mg total) by mouth at bedtime.   30 tablet   6   . sertraline (ZOLOFT) 100 MG tablet   Oral   Take 2 tablets (200 mg total) by mouth daily. 2 tabs in the am   180 tablet   6   . simvastatin  (ZOCOR) 40 MG tablet   Oral   Take 40 mg by mouth at bedtime.          . hydrOXYzine (ATARAX/VISTARIL) 50 MG tablet   Oral   Take 1 tablet (50 mg total) by mouth 3 (three) times daily as needed for anxiety.   30 tablet   0     Allergies:  Asa and Sulfa antibiotics  Family History: Family History  Problem Relation Age of Onset  . Family history unknown: Yes    Social History: Social History  Substance Use Topics  . Smoking status: Former Smoker    Types: Cigarettes    Quit date: 07/29/1975  . Smokeless tobacco: Never Used  . Alcohol Use: No     Review of Systems:   10 point review of systems was performed and was otherwise negative: Review of systems taken through the husband and medical record Constitutional: No fever at home Eyes: No visual disturbances ENT: No sore throat, ear pain Cardiac: No chest pain Respiratory: No shortness of breath, wheezing, or stridor Abdomen: No abdominal pain, no vomiting, No diarrhea Endocrine: No weight loss, No night sweats Extremities: No peripheral edema, cyanosis Skin: He is not aware of the rash who was in the lower abdomen and suprapubic region Neurologic: No focal weakness, trouble with speech or swollowing Urologic: No dysuria, Hematuria, or urinary frequency No exposure to ticks or tickborne disease  Physical Exam:  ED Triage Vitals  Enc Vitals Group     BP 10/03/15 0926 156/61 mmHg     Pulse Rate 10/03/15 0926 101     Resp 10/03/15 0926 12     Temp 10/03/15 0932 100.5 F (38.1 C)     Temp Source 10/03/15 0932 Rectal     SpO2 10/03/15 0903 97 %     Weight 10/03/15 0926 169 lb (76.658 kg)     Height --      Head Cir --      Peak Flow --      Pain Score --      Pain Loc --      Pain Edu? --      Excl. in GC? --     GeneralPatient arrives psychotic, combative, rambling speech with what appears to be some hallucinations.  Head: Normal cephalic , atraumatic Eyes: Pupils equal , round, reactive to  light Nose/Throat: No nasal drainage, patent upper airway without erythema or exudate.  Neck: Supple, Full range of motion, No anterior adenopathy or palpable thyroid masses Lungs: Clear to ascultation without wheezes , rhonchi, or rales Heart: Regular rate, regular rhythm without murmurs , gallops , or rubs Abdomen: Soft, non  tender without rebound, guarding , or rigidity; bowel sounds positive and symmetric in all 4 quadrants. No organomegaly .        Extremities: 2 plus symmetric pulses. No edema, clubbing or cyanosis Neurologic: Patient's moving all extremities spontaneously and cranial nerves II through XII appear to be intact Skin: There is a rash across the abdominal region in the skin fold appears fungal in nature along with a fungal-appearing rash in the pubic region   Labs:   All laboratory work was reviewed including any pertinent negatives or positives listed below:  Labs Reviewed  CBC WITH DIFFERENTIAL/PLATELET - Abnormal; Notable for the following:    WBC 15.2 (*)    RBC 3.71 (*)    Hemoglobin 11.3 (*)    HCT 32.7 (*)    Neutro Abs 11.8 (*)    Monocytes Absolute 1.5 (*)    Basophils Absolute 0.2 (*)    All other components within normal limits  COMPREHENSIVE METABOLIC PANEL - Abnormal; Notable for the following:    Sodium 149 (*)    Chloride 117 (*)    CO2 19 (*)    BUN 34 (*)    Creatinine, Ser 1.01 (*)    Total Protein 8.2 (*)    AST 65 (*)    GFR calc non Af Amer 52 (*)    All other components within normal limits  ACETAMINOPHEN LEVEL - Abnormal; Notable for the following:    Acetaminophen (Tylenol), Serum <10 (*)    All other components within normal limits  LACTIC ACID, PLASMA - Abnormal; Notable for the following:    Lactic Acid, Venous 2.8 (*)    All other components within normal limits  URINALYSIS COMPLETEWITH MICROSCOPIC ([facility] ONLY) - Abnormal; Notable for the following:    Color, Urine AMBER (*)    APPearance CLOUDY (*)    Ketones, ur 1+ (*)    Hgb  urine dipstick 2+ (*)    Protein, ur 100 (*)    Leukocytes, UA 3+ (*)    Bacteria, UA MANY (*)    Squamous Epithelial / LPF 0-5 (*)    All other components within normal limits  CULTURE, BLOOD (ROUTINE X 2)  CULTURE, BLOOD (ROUTINE X 2)  URINE CULTURE  AMMONIA  ETHANOL  TSH  MAGNESIUM  SALICYLATE LEVEL  LACTIC ACID, PLASMA  Reviewed the patient's laboratory work shows a urinary tract infection with an elevated white count along with lactic acid level is elevated. Blood cultures 2 and urine culture pending at this time. Patient has some very mild findings of dehydration with an elevated BUN/creatinine ratio and an elevated sodium level.  EKG: * ED ECG REPORT I, Jennye Moccasin, the attending physician, personally viewed and interpreted this ECG.  Date: 10/03/2015 EKG Time: 0935 Rate: 96 Rhythm: normal sinus rhythm QRS Axis: normal Intervals: normal ST/T Wave abnormalities: Prolonged QT interval Conduction Disturbances: none Narrative Interpretation: unremarkable No acute ischemic changes  Radiology:      [facility] Chest Port 1 View (Final result) Result time: 10/03/15 10:32:15   Final result by Rad Results In Interface (10/03/15 10:32:15)   Narrative:   CLINICAL DATA: Altered mental status. Hypertension.  EXAM: PORTABLE CHEST 1 VIEW  COMPARISON: September 17, 2014  FINDINGS: There is no edema or consolidation. Heart size and pulmonary vascularity are normal. No adenopathy. There is atherosclerotic calcification in the aortic arch region. There is degenerative change in each shoulder.  IMPRESSION: No edema or consolidation. There is aortic atherosclerosis.   Electronically Signed  By: Bretta BangWilliam Woodruff III M.D. On: 10/03/2015 10:32          CT HEAD WO CONTRAST (Final result) Result time: 10/03/15 10:31:36   Final result by Rad Results In Interface (10/03/15 10:31:36)   Narrative:   CLINICAL DATA: Pain following fall. Altered mental  status  EXAM: CT HEAD WITHOUT CONTRAST  TECHNIQUE: Contiguous axial images were obtained from the base of the skull through the vertex without intravenous contrast.  COMPARISON: Head CT March 15, 2010; brain MRI September 24, 2014  FINDINGS: Brain: There is age related volume loss. There is no intracranial mass, hemorrhage, extra-axial fluid collection, or midline shift. Patchy small vessel disease in the centra semiovale is present. No new gray-white compartment lesions are identified. No acute infarct is evident.  Vascular: There are foci of calcification in both distal vertebral arteries as well as in the cavernous carotid arteries bilaterally.  Skull: The bony calvarium appears intact.  Sinuses/Orbits: Orbits appear symmetric bilaterally. Paranasal sinuses are clear.  Other: There is evidence of previous mastoidectomy. There has been surgery in the right middle ear region. There is apparent debris in this region, also present on the 20/11 study. Mastoid air cells are clear on the left.  IMPRESSION: Age related volume loss with patchy periventricular small vessel disease. No acute infarct evident. No hemorrhage or mass effect.  Prior right-sided mastoid and middle ear surgery with debris in the right postoperative region, stable from 20/11. Mastoids on the left clear.  Foci of arterial vascular calcification noted.   Electronically Signed By: Bretta BangWilliam Woodruff III M.D. On: 10/03/2015 10:31         I personally reviewed the radiologic studies  CRITICAL CARE Performed by: Jennye MoccasinBrian S Quigley   Total critical care time: 45 minutes  Critical care time was exclusive of separately billable procedures and treating other patients.  Critical care was necessary to treat or prevent imminent or life-threatening deterioration.  Critical care was time spent personally by me on the following activities: development of treatment plan with patient and/or surrogate as  well as nursing, discussions with consultants, evaluation of patient's response to treatment, examination of patient, obtaining history from patient or surrogate, ordering and performing treatments and interventions, ordering and review of laboratory studies, ordering and review of radiographic studies, pulse oximetry and re-evaluation of patient's condition. Sedation workup for altered mental status  ED Course:   Patient received ketamine 5 mg/kg IM injected by myself for agitation so that we could perform laboratory work etc. Patient was a danger to herself along with the staff with her psychosis. The patient's husband appears to be of understanding. Patient responded well to the ketamine with a will require urinalysis, lab work, head CT etc. She was found to have a urinary tract infection which likely explains her low-grade fever. She does not have any meningeal signs and I felt it was unlikely the patient had acute meningitis or encephalitis at this time. Told on IV antibiotic after return of her laboratory work. Her lactic acid level was only slightly elevated and I felt was unlikely that she was acutely septic at this time and remains hemodynamically stable.   Assessment: * Acute psychoses History of depression Urinary tract infection  Final Clinical Impression:   Final diagnoses:  Brief psychotic disorder  Acute urinary tract infection     Plan:  Patient requires inpatient observation and psychiatric consultation. I put in for the consult for psychiatry. She is yet to be started back on her antipsychotic medications especially  for those for depression. According to her husband she's had similar episodes in the past.          Jennye Moccasin, MD 10/03/15 1158

## 2015-10-03 NOTE — ED Notes (Signed)
Patient transported to CT 

## 2015-10-03 NOTE — Progress Notes (Signed)
Upon admission to the ICU Geodon was given and did not improve patient's agitation and combative behavior therefore Dr. Winona LegatoVaickute ordered precedex drip which has helped calm patient tremendously.  Patient continues to become agitated and combative with patient care but much calmer than when she was initially admitted from the ED.  Patient alert to self.  Follows occasional commands such as taking oral temperature.  Confused conversation with incomprehensible speech.  When asleep patient's blood pressure is soft but when awake WDL.  Husband came and visited.

## 2015-10-03 NOTE — H&P (Addendum)
[facility] [place] [facility]   PATIENT NAME: Jenna Dudley    MR#:  [id number]  DATE OF BIRTH:  1935-12-10  DATE OF ADMISSION:  10/03/2015  PRIMARY CARE PHYSICIAN: Toya SmothersADEROJU, ELIZABETH OYEYEMI, MD   REQUESTING/REFERRING PHYSICIAN:   CHIEF COMPLAINT:   Chief Complaint  Patient presents with  . Altered Mental Status    HISTORY OF PRESENT ILLNESS: Jenna Dudley  is a 80 y.o. female with a known history of multiple medical problems including essential hypertension, hyperlipidemia, chronic low back pain, major depressive disorder, psychotic behavior, who was on ECT therapy, treated by Dr. Toni Amendlapacs, last one in January 2017, who presented to the hospital agitated, requiring ketamine injection. Apparently the patient did not see a psychiatrist since January 2017, she was running out from her psychiatric medications, and her primary care physician refused to refill them until she is seen by psychiatrist. Patient has been off medications for the past 2 days and getting more confused,, hearing voices, per patient's husband. She was noted to be fatigued and weak, laying in bed for the past few days and complaining of back pains. On arrival to emergency room, she was also noted to be to have fever to 100.5 rectally, her urinalysis was concerning for urinary tract infection since it has significant pyuria. Unfortunately, patient is not able to provide review of systems due to her mental status and decrease and ketamine  Infusion. Hospitalist services were contacted for admission.  PAST MEDICAL HISTORY:   Past Medical History  Diagnosis Date  . Hypertension   . Hyperlipemia 07/29/14  . Chronic low back pain 07/29/14  . Spinal stenosis 07/29/14  . GERD (gastroesophageal reflux disease)   . Depression   . Esophageal spasm 07/29/14  . Bilateral carpal tunnel syndrome 07/27/14  . Degenerative arthritis of lumbar spine 07/27/14    PAST SURGICAL HISTORY:  Past Surgical History  Procedure  Laterality Date  . Hip surgery Right   . Replacement total knee bilateral Bilateral 07/27/14  . Inner ear surgery Right     SOCIAL HISTORY:  Social History  Substance Use Topics  . Smoking status: Former Smoker    Types: Cigarettes    Quit date: 07/29/1975  . Smokeless tobacco: Never Used  . Alcohol Use: No    FAMILY HISTORY:  Family History  Problem Relation Age of Onset  . Family history unknown: Yes    DRUG ALLERGIES:  Allergies  Allergen Reactions  . Asa [Aspirin] Other (See Comments)    Unknown reaction  . Sulfa Antibiotics Other (See Comments)    Unknown reaction    Review of Systems  Unable to perform ROS: mental acuity    MEDICATIONS AT HOME:  Prior to Admission medications   Medication Sig Start Date End Date Taking? Authorizing Provider  acetaminophen-codeine (TYLENOL #3) 300-30 MG per tablet Take 1 tablet by mouth every 8 (eight) hours as needed for moderate pain or severe pain.   Yes Historical Provider, MD  losartan (COZAAR) 25 MG tablet Take 1 tablet (25 mg total) by mouth daily. 09/26/14  Yes Jolanta B Pucilowska, MD  mirtazapine (REMERON) 45 MG tablet Take 1 tablet (45 mg total) by mouth at bedtime. 02/04/15  Yes Audery AmelJohn T Clapacs, MD  OLANZapine (ZYPREXA) 20 MG tablet Take 1 tablet (20 mg total) by mouth at bedtime. 02/04/15  Yes Audery AmelJohn T Clapacs, MD  omeprazole (PRILOSEC) 20 MG capsule Take 20 mg by mouth 2 (two) times daily before a meal.  Yes Historical Provider, MD  potassium chloride SA (K-DUR,KLOR-CON) 20 MEQ tablet Take 20 mEq by mouth daily.   Yes Historical Provider, MD  QUEtiapine (SEROQUEL) 300 MG tablet Take 1 tablet (300 mg total) by mouth at bedtime. 02/04/15  Yes Audery Amel, MD  sertraline (ZOLOFT) 100 MG tablet Take 2 tablets (200 mg total) by mouth daily. 2 tabs in the am Patient taking differently: Take 200 mg by mouth every morning.  02/04/15  Yes Audery Amel, MD  simvastatin (ZOCOR) 40 MG tablet Take 40 mg by mouth at bedtime.    Yes  Historical Provider, MD  hydrOXYzine (ATARAX/VISTARIL) 50 MG tablet Take 1 tablet (50 mg total) by mouth 3 (three) times daily as needed for anxiety. 09/26/14   Shari Prows, MD      PHYSICAL EXAMINATION:   VITAL SIGNS: Blood pressure 144/50, pulse 95, temperature 100.5 F (38.1 C), temperature source Rectal, resp. rate 27, weight 76.658 kg (169 lb), SpO2 100 %.  GENERAL:  80 y.o.-year-old patient lying in the bed with no acute distress. Not responding to verbal stimuli, however withdrawals if she is touched, moves upper and lower extremities, trying to avoid examiner, grimaces. Nonverbal, groans intermittently EYES: Pupils equal, round, reactive to light and accommodation. No scleral icterus. Extraocular muscles intact.  HEENT: Head atraumatic, normocephalic. Oropharynx and nasopharynx clear.  Dry oral mucosa NECK:  Supple, no jugular venous distention. No thyroid enlargement, no tenderness.  LUNGS: Normal breath sounds bilaterally, no wheezing, rales,rhonchi or crepitation. No use of accessory muscles of respiration.  CARDIOVASCULAR: S1, S2 rhythm was regular . 3 / 6 systolic murmur was heard precordially  with radiation to left axilla, no rubs, or gallops.  ABDOMEN: Soft, difficult to examine due to patient withdrawing and mowing to touch of her abdomen diffusely, seems to be tender suprapubically as well as in upper abdomen but no rebound or guarding were noted, ondistended. Bowel sounds present. No organomegaly or mass.  EXTREMITIES: No pedal edema, cyanosis, or clubbing. Peripheral pulses are 1+ NEUROLOGIC: Cranial nerves II through XII are grossly intact. Muscle strength , unable to evaluate since patient is poorly, weak and not able to comply with commands, however, able to move all extremities to stimulation . Sensation grossly  intact. Gait not checked.  PSYCHIATRIC: The patient is somnolent, not able to assess orientation.  SKIN: No obvious rash, lesion, or ulcer.   LABORATORY  PANEL:   CBC  Recent Labs Lab 10/03/15 0937  WBC 15.2*  HGB 11.3*  HCT 32.7*  PLT 268  MCV 88.2  MCH 30.4  MCHC 34.5  RDW 14.5  LYMPHSABS 1.6  MONOABS 1.5*  EOSABS 0.0  BASOSABS 0.2*   ------------------------------------------------------------------------------------------------------------------  Chemistries   Recent Labs Lab 10/03/15 0937  NA 149*  K 3.8  CL 117*  CO2 19*  GLUCOSE 99  BUN 34*  CREATININE 1.01*  CALCIUM 9.3  MG 2.1  AST 65*  ALT 20  ALKPHOS 80  BILITOT 1.0   ------------------------------------------------------------------------------------------------------------------  Cardiac Enzymes No results for input(s): TROPONINI in the last 168 hours. ------------------------------------------------------------------------------------------------------------------  RADIOLOGY: Ct Head Wo Contrast  10/03/2015  CLINICAL DATA:  Pain following fall.  Altered mental status EXAM: CT HEAD WITHOUT CONTRAST TECHNIQUE: Contiguous axial images were obtained from the base of the skull through the vertex without intravenous contrast. COMPARISON:  Head CT March 15, 2010; brain MRI September 24, 2014 FINDINGS: Brain: There is age related volume loss. There is no intracranial mass, hemorrhage, extra-axial fluid collection, or  midline shift. Patchy small vessel disease in the centra semiovale is present. No new gray-white compartment lesions are identified. No acute infarct is evident. Vascular: There are foci of calcification in both distal vertebral arteries as well as in the cavernous carotid arteries bilaterally. Skull: The bony calvarium appears intact. Sinuses/Orbits: Orbits appear symmetric bilaterally. Paranasal sinuses are clear. Other: There is evidence of previous mastoidectomy. There has been surgery in the right middle ear region. There is apparent debris in this region, also present on the 20/11 study. Mastoid air cells are clear on the left. IMPRESSION: Age  related volume loss with patchy periventricular small vessel disease. No acute infarct evident. No hemorrhage or mass effect. Prior right-sided mastoid and middle ear surgery with debris in the right postoperative region, stable from 20/11. Mastoids on the left clear. Foci of arterial vascular calcification noted. Electronically Signed   By: Bretta Bang III M.D.   On: 10/03/2015 10:31   Dg Chest Port 1 View  10/03/2015  CLINICAL DATA:  Altered mental status.  Hypertension. EXAM: PORTABLE CHEST 1 VIEW COMPARISON:  September 17, 2014 FINDINGS: There is no edema or consolidation. Heart size and pulmonary vascularity are normal. No adenopathy. There is atherosclerotic calcification in the aortic arch region. There is degenerative change in each shoulder. IMPRESSION: No edema or consolidation.  There is aortic atherosclerosis. Electronically Signed   By: Bretta Bang III M.D.   On: 10/03/2015 10:32    EKG: Orders placed or performed during the hospital encounter of 10/03/15  . EKG 12-Lead  . EKG 12-Lead  . EKG 12-Lead  . EKG 12-Lead   EKG in the emergency room reveals sinus rhythm at 96 bpm, Q waves in anteroseptal leads, prolonged QTC to 522 ms, nonspecific ST-T changes    IMPRESSION AND PLAN:  Principal Problem:   Sepsis (HCC) Active Problems:   Acute pyelonephritis   Hypernatremia   Lactic acidosis   Acute renal insufficiency   Agitation   Leukocytosis  #1. Sepsis, admitted to a medical floor, get blood cultures, initiate her on Rocephin intravenously while awaiting urinary and blood culture results   #2. Acute pyelonephritis, continue antibiotic therapy, awaiting for culture results as above   #3. Hypernatremia, initiate IV fluids with D5 half-normal, following sodium level closely #4. Lactic acidosis, IV fluids, suspect intravascular depletion #5. Acute renal insufficiency, get ultrasound of kidneys, continue IV fluids, follow creatinine in the morning #6. Agitation, questionable  metabolic encephalopathy, delirium versus psychiatric disease related, initiate patient on her psychiatric medications. The psychiatrist involved for further recommendations #6. Leukocytosis, follow with antibiotic therapy  All the records are reviewed and case discussed with ED provider. Management plans discussed with the patient, family and they are in agreement.  CODE STATUS: Code Status History    Date Active Date Inactive Code Status Order ID Comments User Context   09/17/2014  3:01 AM 09/26/2014  7:29 PM Full Code [id number]  Audery Amel, MD Inpatient       TOTAL TIME TAKING CARE OF THIS PATIENT: 50 nutes.   discussed this patient's husband, all questions answered, voiced understanding   Montserrat Shek M.D on 10/03/2015 at 12:47 PM  Between 7am to 6pm - Pager - [phone] After 6pm go to [email or web address] - password [email or web address] [facility]  [facility]  Office  [phone]  CC: Primary care physician; ADEROJU, Marin Olp, MD

## 2015-10-03 NOTE — ED Notes (Signed)
Pt arrived by EMS from home after and assisted fall with her husband. Pt has AMS which EMS said husband stated started today. Pt combative with EMS and ED staff. EMS gave pt 5 of haldol in route. Pt continues to be combative with ED staff.

## 2015-10-04 ENCOUNTER — Inpatient Hospital Stay: Payer: Medicare Other

## 2015-10-04 DIAGNOSIS — G9340 Encephalopathy, unspecified: Secondary | ICD-10-CM

## 2015-10-04 LAB — SODIUM: Sodium: 144 mmol/L (ref 135–145)

## 2015-10-04 LAB — CBC
HEMATOCRIT: 28.9 % — AB (ref 35.0–47.0)
HEMOGLOBIN: 9.8 g/dL — AB (ref 12.0–16.0)
MCH: 30.6 pg (ref 26.0–34.0)
MCHC: 33.9 g/dL (ref 32.0–36.0)
MCV: 90.3 fL (ref 80.0–100.0)
Platelets: 180 10*3/uL (ref 150–440)
RBC: 3.21 MIL/uL — ABNORMAL LOW (ref 3.80–5.20)
RDW: 14.8 % — ABNORMAL HIGH (ref 11.5–14.5)
WBC: 8.6 10*3/uL (ref 3.6–11.0)

## 2015-10-04 LAB — BASIC METABOLIC PANEL
ANION GAP: 3 — AB (ref 5–15)
BUN: 29 mg/dL — AB (ref 6–20)
CHLORIDE: 117 mmol/L — AB (ref 101–111)
CO2: 23 mmol/L (ref 22–32)
Calcium: 8.3 mg/dL — ABNORMAL LOW (ref 8.9–10.3)
Creatinine, Ser: 0.83 mg/dL (ref 0.44–1.00)
GFR calc Af Amer: >60 mL/min (ref >60)
Glucose, Bld: 123 mg/dL — ABNORMAL HIGH (ref 65–99)
POTASSIUM: 3.5 mmol/L (ref 3.5–5.1)
SODIUM: 143 mmol/L (ref 135–145)

## 2015-10-04 LAB — TROPONIN I: TROPONIN I: 0.11 ng/mL — AB (ref <0.03)

## 2015-10-04 LAB — HEMOGLOBIN A1C: HEMOGLOBIN A1C: 6 % (ref 4.0–6.0)

## 2015-10-04 MED ORDER — SODIUM CHLORIDE 0.9 % IV BOLUS (SEPSIS)
500.0000 mL | Freq: Once | INTRAVENOUS | Status: AC
  Administered 2015-10-04: 500 mL via INTRAVENOUS

## 2015-10-04 MED ORDER — SODIUM CHLORIDE 0.9 % IV BOLUS (SEPSIS)
250.0000 mL | Freq: Once | INTRAVENOUS | Status: AC
  Administered 2015-10-04: 250 mL via INTRAVENOUS

## 2015-10-04 MED ORDER — NYSTATIN 100000 UNIT/GM EX POWD
Freq: Three times a day (TID) | CUTANEOUS | Status: DC
  Administered 2015-10-04 – 2015-10-15 (×19): via TOPICAL
  Filled 2015-10-04: qty 15

## 2015-10-04 NOTE — Progress Notes (Signed)
Dr. Sherryll BurgerShah present and gave order to discontinue q4H sodium checks.

## 2015-10-04 NOTE — Progress Notes (Signed)
PT Cancellation Note  Patient Details Name: Jenna Dudley MRN: [id number] DOB: [birth year]   Cancelled Treatment:    Reason Eval/Treat Not Completed: Patient's level of consciousness. Patient is currently on Precedex drip, sedated and unable to participate in PT evaluation. PT will continue to follow and re-attempt as patient becomes more appropriate.   Kerin RansomPatrick A Nadra Hritz, PT, DPT    10/04/2015, 3:16 PM

## 2015-10-04 NOTE — Progress Notes (Signed)
Off precedex drip since 1400.  Alert to self only.  Confused to situation.  Follows commands at times but hollars with patient care even when being cooperative which is not often.  Confused conversation mostly with incomprehensible speech at times.  Mostly calm until staff speaks to her or performs patient care and then patient becomes agitated.    NSR per cardiac monitor.  Husband visited this evening and tried to get patient to eat dinner and she refused only drinking soda.  Husband loving and attentive.  Dr. Toni Amendlapacs to come see patient tomorrow.

## 2015-10-04 NOTE — Care Management (Signed)
Admitted with altered mental status from home where she lives with her spouse. Spoke with spouse who is her support system.  Normally independent and active but does not drive. Spouse states she has a fear of being in the hospital which makes her more paranoid.   Psychiatric history. Followed by Dr. Clapacs. She has Toni Amendbeen noncompliant with psychiatric follow up and ran out of her medications several days prior to admission. Currently on Precedex gtt due to agitation. She is very angry per staff and is not communicating well with others. PCP is Dr. Micheline ChapmanAderoju. Pharmacy is [facility].

## 2015-10-04 NOTE — Consult Note (Signed)
[facility] Consult   Reason for Consult:  Altered mental status Referring Physician:  Dr. Marcelene Butte Patient Identification: Jenna Dudley MRN:  [id number] Principal Diagnosis: Acute encephalopathy Diagnosis:   Patient Active Problem List   Diagnosis Date Noted  . Acute encephalopathy [G93.40] 10/04/2015  . Sepsis ([place]) [A41.9] 10/03/2015  . Acute pyelonephritis [N10] 10/03/2015  . Hypernatremia [E87.0] 10/03/2015  . Lactic acidosis [E87.2] 10/03/2015  . Acute renal insufficiency [N28.9] 10/03/2015  . Leukocytosis [D72.829] 10/03/2015  . Agitation [R45.1] 10/03/2015  . Severe recurrent major depression without psychotic features ([place]) [F33.2]   . Major depressive disorder, recurrent episode, severe, with psychotic behavior ([place]) [F33.3] 10/13/2014  . Severe episode of recurrent major depressive disorder, with psychotic features ([place]) [F33.3] 10/13/2014  . Major depressive disorder, 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 features ([place]) [F33.2] 10/06/2014  . Infection of urinary tract [N39.0] 09/17/2014  . Essential hypertension [I10] 09/17/2014  . Chronic back pain [M54.9, G89.29] 09/17/2014  . Gastric reflux [K21.9] 09/17/2014  . History of stroke [Z86.73] 09/17/2014  . Hypertension [I10] 09/16/2014  . Severe recurrent major depression with psychotic features ([place]) [F33.3]   . 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 Hammer toe [M20.40] 09/12/2013  . Foot pain [M79.673] 09/12/2013  . Fungal infection of nail [B35.1] 09/12/2013  . Carpal tunnel syndrome [G56.00] 12/21/2010  . Chronic LBP [M54.5, G89.29] 12/21/2010  . Degenerative arthritis of lumbar spine [M47.816] 12/21/2010  . Clinical depression [F32.9] 12/21/2010  . 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 HLD (hyperlipidemia) [E78.5] 12/21/2010  . BP (high blood pressure) [I10] 12/21/2010  . Acid reflux [K21.9] 12/21/2010  . Spinal stenosis [M48.00] 12/21/2010    Total Time spent with  patient: 45 minutes  Subjective:    Identifying data. Jenna Dudley is a 80 y.o. female with a history of psychotic depression.  Chief complaint. "Why are you torturing me."  History of present illness. Information was obtained from the patient, the chart and the husband. The patient has a long history of sever depression with multiple hospital admissions and medication trials. For the past 3 years she had been receiving ECT treatment for her depression. In the spring of 2017 she refused further ECT treatment. She did continue on her current regimen of Zyprexa, Seroquel, and from and Zoloft all along. She ran out of Zyprexa 2 days ago. The patient has been doing fairly well at home until a week ago when she stopped her regular around the house activities. Yesterday she almost fell at home and by the time she was brought to the emergency room she was confused and combative. She was diagnosed with urosepsis and was admitted to CCU where they had to start Precedex drip in order to calm her down. Today the patient is still in CCU and somewhat sedated from her medications. She is no longer agitated and is able to follow simple directions. She knows she is in the hospital. She misses her husband. She would like to see Dr. Weber Cooks, her primary psychiatrist. Other than that she is rather disorganized and confused. She accuses me of torturing her "just like her mother did". Most of the time her speech is incomprehensible. I spoke with her husband who confirms that the patient has been severely depressed for several days prior to hospitalization but there were no suicidal or homicidal threats. The patient has a long history of treatment resistant depression and at times she gets psychotic. Usually manifests itself by confusion and bizarre behavior. She does not get  violent. She did have episodes of confusion and agitation when diagnosed with UTI.  Past psychiatric history. Long history of depression with multiple  hospitalizations, medication trials and ECT treatments. She follows up with Dr. Weber Cooks but the husband has not been able to bring her to his office in several months. She was taking medications on a long but they are about to run out. They already run out of Zyprexa 2 days ago.  Family psychiatric history. Unknown.  Social history. She lives with her husband was very supportive.  Risk to Self:   Risk to Others:   Prior Inpatient Therapy:   Prior Outpatient Therapy:    Past Medical History:  Past Medical History  Diagnosis Date  . Hypertension   . Hyperlipemia 07/29/14  . Chronic low back pain 07/29/14  . Spinal stenosis 07/29/14  . GERD (gastroesophageal reflux disease)   . Depression   . Esophageal spasm 07/29/14  . Bilateral carpal tunnel syndrome 07/27/14  . Degenerative arthritis of lumbar spine 07/27/14    Past Surgical History  Procedure Laterality Date  . Hip surgery Right   . Replacement total knee bilateral Bilateral 07/27/14  . Inner ear surgery Right    Family History:  Family History  Problem Relation Age of Onset  . Family history unknown: Yes   Social History:  History  Alcohol Use No     History  Drug Use No    Social History   Social History  . Marital Status: Married    Spouse Name: N/A  . Number of Children: N/A  . Years of Education: N/A   Social History Main Topics  . Smoking status: Former Smoker    Types: Cigarettes    Quit date: 07/29/1975  . Smokeless tobacco: Never Used  . Alcohol Use: No  . Drug Use: No  . Sexual Activity: No     Comment: postmenopause   Other Topics Concern  . None   Social History Narrative   Additional Social History:    Allergies:   Allergies  Allergen Reactions  . Asa [Aspirin] Other (See Comments)    Unknown reaction  . Sulfa Antibiotics Other (See Comments)    Unknown reaction    Labs:  Results for orders placed or performed during the hospital encounter of 10/03/15 (from the past 48 hour(s))  Ammonia      Status: None   Collection Time: 10/03/15  9:37 AM  Result Value Ref Range   Ammonia 29 9 - 35 umol/L  CBC with Differential/Platelet     Status: Abnormal   Collection Time: 10/03/15  9:37 AM  Result Value Ref Range   WBC 15.2 (H) 3.6 - 11.0 K/uL   RBC 3.71 (L) 3.80 - 5.20 MIL/uL   Hemoglobin 11.3 (L) 12.0 - 16.0 g/dL   HCT 32.7 (L) 35.0 - 47.0 %   MCV 88.2 80.0 - 100.0 fL   MCH 30.4 26.0 - 34.0 pg   MCHC 34.5 32.0 - 36.0 g/dL   RDW 14.5 11.5 - 14.5 %   Platelets 268 150 - 440 K/uL   Neutrophils Relative % 77 %   Neutro Abs 11.8 (H) 1.4 - 6.5 K/uL   Lymphocytes Relative 11 %   Lymphs Abs 1.6 1.0 - 3.6 K/uL   Monocytes Relative 10 %   Monocytes Absolute 1.5 (H) 0.2 - 0.9 K/uL   Eosinophils Relative 0 %   Eosinophils Absolute 0.0 0 - 0.7 K/uL   Basophils Relative 2 %   Basophils  Absolute 0.2 (H) 0 - 0.1 K/uL  Comprehensive metabolic panel     Status: Abnormal   Collection Time: 10/03/15  9:37 AM  Result Value Ref Range   Sodium 149 (H) 135 - 145 mmol/L   Potassium 3.8 3.5 - 5.1 mmol/L   Chloride 117 (H) 101 - 111 mmol/L   CO2 19 (L) 22 - 32 mmol/L   Glucose, Bld 99 65 - 99 mg/dL   BUN 34 (H) 6 - 20 mg/dL   Creatinine, Ser 1.01 (H) 0.44 - 1.00 mg/dL   Calcium 9.3 8.9 - 10.3 mg/dL   Total Protein 8.2 (H) 6.5 - 8.1 g/dL   Albumin 5.0 3.5 - 5.0 g/dL   AST 65 (H) 15 - 41 U/L   ALT 20 14 - 54 U/L   Alkaline Phosphatase 80 38 - 126 U/L   Total Bilirubin 1.0 0.3 - 1.2 mg/dL   GFR calc non Af Amer 52 (L) >60 mL/min   GFR calc Af Amer >60 >60 mL/min    Comment: (NOTE) The eGFR has been calculated using the CKD EPI equation. This calculation has not been validated in all clinical situations. eGFR's persistently <60 mL/min signify possible Chronic Kidney Disease.    Anion gap 13 5 - 15  Ethanol     Status: None   Collection Time: 10/03/15  9:37 AM  Result Value Ref Range   Alcohol, Ethyl (B) <5 <5 mg/dL    Comment:        LOWEST DETECTABLE LIMIT FOR SERUM ALCOHOL IS 5  mg/dL FOR MEDICAL PURPOSES ONLY   TSH     Status: None   Collection Time: 10/03/15  9:37 AM  Result Value Ref Range   TSH 1.261 0.350 - 4.500 uIU/mL  Magnesium     Status: None   Collection Time: 10/03/15  9:37 AM  Result Value Ref Range   Magnesium 2.1 1.7 - 2.4 mg/dL  Acetaminophen level     Status: Abnormal   Collection Time: 10/03/15  9:37 AM  Result Value Ref Range   Acetaminophen (Tylenol), Serum <10 (L) 10 - 30 ug/mL    Comment:        THERAPEUTIC CONCENTRATIONS VARY SIGNIFICANTLY. A RANGE OF 10-30 ug/mL MAY BE AN EFFECTIVE CONCENTRATION FOR MANY PATIENTS. HOWEVER, SOME ARE BEST TREATED AT CONCENTRATIONS OUTSIDE THIS RANGE. ACETAMINOPHEN CONCENTRATIONS >150 ug/mL AT 4 HOURS AFTER INGESTION AND >50 ug/mL AT 12 HOURS AFTER INGESTION ARE OFTEN ASSOCIATED WITH TOXIC REACTIONS.   Salicylate level     Status: None   Collection Time: 10/03/15  9:37 AM  Result Value Ref Range   Salicylate Lvl <6.9 2.8 - 30.0 mg/dL  Lactic acid, plasma     Status: Abnormal   Collection Time: 10/03/15  9:37 AM  Result Value Ref Range   Lactic Acid, Venous 2.8 (HH) 0.5 - 1.9 mmol/L    Comment: CRITICAL RESULT CALLED TO, READ BACK BY AND VERIFIED WITH KIM MAIN ON 10/03/15 AT 1022 BY [facility]   Urinalysis complete, with microscopic ([facility] only)     Status: Abnormal   Collection Time: 10/03/15  9:37 AM  Result Value Ref Range   Color, Urine AMBER (A) YELLOW   APPearance CLOUDY (A) CLEAR   Glucose, UA NEGATIVE NEGATIVE mg/dL   Bilirubin Urine NEGATIVE NEGATIVE   Ketones, ur 1+ (A) NEGATIVE mg/dL   Specific Gravity, Urine 1.024 1.005 - 1.030   Hgb urine dipstick 2+ (A) NEGATIVE   pH 5.0 5.0 - 8.0   Protein,  ur 100 (A) NEGATIVE mg/dL   Nitrite NEGATIVE NEGATIVE   Leukocytes, UA 3+ (A) NEGATIVE   RBC / HPF 6-30 0 - 5 RBC/hpf   WBC, UA TOO NUMEROUS TO COUNT 0 - 5 WBC/hpf   Bacteria, UA MANY (A) NONE SEEN   Squamous Epithelial / LPF 0-5 (A) NONE SEEN   WBC Clumps PRESENT    Mucous PRESENT     Hyaline Casts, UA PRESENT   Urine culture     Status: Abnormal (Preliminary result)   Collection Time: 10/03/15  9:37 AM  Result Value Ref Range   Specimen Description URINE, CLEAN CATCH    Special Requests NONE    Culture >=100,000 COLONIES/mL ESCHERICHIA COLI (A)    Report Status PENDING   Troponin I     Status: Abnormal   Collection Time: 10/03/15  9:37 AM  Result Value Ref Range   Troponin I 0.16 (HH) <0.03 ng/mL    Comment: CRITICAL VALUE NOTED. VALUE IS CONSISTENT WITH PREVIOUSLY REPORTED/CALLED VALUE [facility]   Culture, blood (Routine X 2) w Reflex to ID Panel     Status: None (Preliminary result)   Collection Time: 10/03/15 11:08 AM  Result Value Ref Range   Specimen Description BLOOD RIGHT ANTECUBITAL    Special Requests BOTTLES DRAWN AEROBIC AND ANAEROBIC  3CC    Culture NO GROWTH < 24 HOURS    Report Status PENDING   Culture, blood (Routine X 2) w Reflex to ID Panel     Status: None (Preliminary result)   Collection Time: 10/03/15 11:08 AM  Result Value Ref Range   Specimen Description BLOOD LEFT HAND    Special Requests BOTTLES DRAWN AEROBIC AND ANAEROBIC  1CC    Culture NO GROWTH < 24 HOURS    Report Status PENDING   Lactic acid, plasma     Status: None   Collection Time: 10/03/15 12:35 PM  Result Value Ref Range   Lactic Acid, Venous 0.7 0.5 - 1.9 mmol/L  MRSA PCR Screening     Status: None   Collection Time: 10/03/15  1:41 PM  Result Value Ref Range   MRSA by PCR NEGATIVE NEGATIVE    Comment:        The GeneXpert MRSA Assay (FDA approved for NASAL specimens only), is one component of a comprehensive MRSA colonization surveillance program. It is not intended to diagnose MRSA infection nor to guide or monitor treatment for MRSA infections.   Glucose, capillary     Status: Abnormal   Collection Time: 10/03/15  1:47 PM  Result Value Ref Range   Glucose-Capillary 110 (H) 65 - 99 mg/dL  Basic metabolic panel     Status: Abnormal   Collection Time: 10/03/15   4:08 PM  Result Value Ref Range   Sodium 149 (H) 135 - 145 mmol/L   Potassium 3.6 3.5 - 5.1 mmol/L   Chloride 120 (H) 101 - 111 mmol/L   CO2 20 (L) 22 - 32 mmol/L   Glucose, Bld 141 (H) 65 - 99 mg/dL   BUN 30 (H) 6 - 20 mg/dL   Creatinine, Ser 0.84 0.44 - 1.00 mg/dL   Calcium 8.6 (L) 8.9 - 10.3 mg/dL   GFR calc non Af Amer >60 >60 mL/min   GFR calc Af Amer >60 >60 mL/min    Comment: (NOTE) The eGFR has been calculated using the CKD EPI equation. This calculation has not been validated in all clinical situations. eGFR's persistently <60 mL/min signify possible Chronic Kidney Disease.  Anion gap 9 5 - 15  CBC     Status: Abnormal   Collection Time: 10/03/15  4:08 PM  Result Value Ref Range   WBC 11.5 (H) 3.6 - 11.0 K/uL   RBC 3.46 (L) 3.80 - 5.20 MIL/uL   Hemoglobin 10.6 (L) 12.0 - 16.0 g/dL   HCT 31.3 (L) 35.0 - 47.0 %   MCV 90.5 80.0 - 100.0 fL   MCH 30.5 26.0 - 34.0 pg   MCHC 33.7 32.0 - 36.0 g/dL   RDW 14.3 11.5 - 14.5 %   Platelets 207 150 - 440 K/uL  Troponin I     Status: Abnormal   Collection Time: 10/03/15  4:08 PM  Result Value Ref Range   Troponin I 0.16 (HH) <0.03 ng/mL    Comment: CRITICAL RESULT CALLED TO, READ BACK BY AND VERIFIED WITH BRITTANY RUDD ON 10/03/15 AT 1657 BY [facility]   Hemoglobin A1c     Status: None   Collection Time: 10/03/15  4:08 PM  Result Value Ref Range   Hgb A1c MFr Bld 6.0 4.0 - 6.0 %  Troponin I     Status: Abnormal   Collection Time: 10/03/15  9:16 PM  Result Value Ref Range   Troponin I 0.12 (HH) <0.03 ng/mL    Comment: CRITICAL VALUE NOTED. VALUE IS CONSISTENT WITH PREVIOUSLY REPORTED/CALLED VALUE  Beta-hydroxybutyric acid     Status: None   Collection Time: 10/03/15  9:16 PM  Result Value Ref Range   Beta-Hydroxybutyric Acid 0.27 0.05 - 0.27 mmol/L  Sodium     Status: Abnormal   Collection Time: 10/03/15  9:16 PM  Result Value Ref Range   Sodium 146 (H) 135 - 145 mmol/L  Troponin I     Status: Abnormal   Collection Time:  10/04/15  1:25 AM  Result Value Ref Range   Troponin I 0.11 (HH) <0.03 ng/mL    Comment: CRITICAL VALUE NOTED. VALUE IS CONSISTENT WITH PREVIOUSLY REPORTED/CALLED VALUE.PMH  Sodium     Status: None   Collection Time: 10/04/15  1:25 AM  Result Value Ref Range   Sodium 144 135 - 145 mmol/L  Basic metabolic panel     Status: Abnormal   Collection Time: 10/04/15  5:42 AM  Result Value Ref Range   Sodium 143 135 - 145 mmol/L   Potassium 3.5 3.5 - 5.1 mmol/L   Chloride 117 (H) 101 - 111 mmol/L   CO2 23 22 - 32 mmol/L   Glucose, Bld 123 (H) 65 - 99 mg/dL   BUN 29 (H) 6 - 20 mg/dL   Creatinine, Ser 0.83 0.44 - 1.00 mg/dL   Calcium 8.3 (L) 8.9 - 10.3 mg/dL   GFR calc non Af Amer >60 >60 mL/min   GFR calc Af Amer >60 >60 mL/min    Comment: (NOTE) The eGFR has been calculated using the CKD EPI equation. This calculation has not been validated in all clinical situations. eGFR's persistently <60 mL/min signify possible Chronic Kidney Disease.    Anion gap 3 (L) 5 - 15  CBC     Status: Abnormal   Collection Time: 10/04/15  5:42 AM  Result Value Ref Range   WBC 8.6 3.6 - 11.0 K/uL   RBC 3.21 (L) 3.80 - 5.20 MIL/uL   Hemoglobin 9.8 (L) 12.0 - 16.0 g/dL   HCT 28.9 (L) 35.0 - 47.0 %   MCV 90.3 80.0 - 100.0 fL   MCH 30.6 26.0 - 34.0 pg   MCHC 33.9  32.0 - 36.0 g/dL   RDW 14.8 (H) 11.5 - 14.5 %   Platelets 180 150 - 440 K/uL    Current Facility-Administered Medications  Medication Dose Route Frequency Provider Last Rate Last Dose  . acetaminophen (TYLENOL) tablet 650 mg  650 mg Oral Q6H PRN Theodoro Grist, MD       Or  . acetaminophen (TYLENOL) suppository 650 mg  650 mg Rectal Q6H PRN Theodoro Grist, MD      . acetaminophen-codeine (TYLENOL #3) 300-30 MG per tablet 1 tablet  1 tablet Oral Q8H PRN Theodoro Grist, MD   1 tablet at 10/04/15 0812  . antiseptic oral rinse (CPC / CETYLPYRIDINIUM CHLORIDE 0.05%) solution 7 mL  7 mL Mouth Rinse q12n4p Theodoro Grist, MD   7 mL at 10/03/15 1500  .  aspirin chewable tablet 81 mg  81 mg Oral Daily Theodoro Grist, MD   81 mg at 10/04/15 0809  . cefTRIAXone (ROCEPHIN) 1 g in dextrose 5 % 50 mL IVPB  1 g Intravenous Q24H Theodoro Grist, MD   1 g at 10/04/15 0959  . chlorhexidine (PERIDEX) 0.12 % solution 15 mL  15 mL Mouth Rinse BID Theodoro Grist, MD   15 mL at 10/04/15 1004  . dexmedetomidine (PRECEDEX) 400 MCG/100ML (4 mcg/mL) infusion  0.4-1.2 mcg/kg/hr Intravenous Titrated Theodoro Grist, MD 3.6 mL/hr at 10/04/15 0845 0.2 mcg/kg/hr at 10/04/15 0845  . dextrose 5 % 1,000 mL with potassium chloride 20 mEq infusion   Intravenous Continuous Theodoro Grist, MD 125 mL/hr at 10/04/15 0958    . docusate sodium (COLACE) capsule 100 mg  100 mg Oral BID Theodoro Grist, MD   100 mg at 10/03/15 1415  . enoxaparin (LOVENOX) injection 40 mg  40 mg Subcutaneous Q24H Theodoro Grist, MD   40 mg at 10/03/15 2118  . mirtazapine (REMERON) tablet 45 mg  45 mg Oral QHS Theodoro Grist, MD   45 mg at 10/03/15 2200  . OLANZapine (ZYPREXA) tablet 20 mg  20 mg Oral QHS Theodoro Grist, MD   20 mg at 10/03/15 2200  . ondansetron (ZOFRAN) tablet 4 mg  4 mg Oral Q6H PRN Theodoro Grist, MD       Or  . ondansetron (ZOFRAN) injection 4 mg  4 mg Intravenous Q6H PRN Theodoro Grist, MD      . pantoprazole (PROTONIX) EC tablet 40 mg  40 mg Oral Daily Theodoro Grist, MD   40 mg at 10/04/15 0820  . QUEtiapine (SEROQUEL) tablet 300 mg  300 mg Oral QHS Theodoro Grist, MD   300 mg at 10/03/15 2200  . sertraline (ZOLOFT) tablet 200 mg  200 mg Oral Hulen Skains, MD   200 mg at 10/04/15 0700  . simvastatin (ZOCOR) tablet 40 mg  40 mg Oral QHS Theodoro Grist, MD   40 mg at 10/03/15 2200  . sodium chloride flush (NS) 0.9 % injection 3 mL  3 mL Intravenous Q12H Theodoro Grist, MD   3 mL at 10/04/15 0959  . ziprasidone (GEODON) injection 10 mg  10 mg Intramuscular Q12H PRN Theodoro Grist, MD   10 mg at 10/04/15 6195   Facility-Administered Medications Ordered in Other Encounters  Medication Dose  Route Frequency Provider Last Rate Last Dose  . labetalol (NORMODYNE,TRANDATE) injection 20 mg  20 mg Intravenous Once Gonzella Lex, MD      . labetalol (NORMODYNE,TRANDATE) injection 20 mg  20 mg Intravenous Once Gonzella Lex, MD      . lactated ringers infusion  Intravenous Continuous Gonzella Lex, MD      . methohexital Sodium 60 mg  60 mg Intravenous Once Gonzella Lex, MD      . methohexital Sodium 60 mg  60 mg Intravenous Once Gonzella Lex, MD      . succinylcholine (ANECTINE) injection 80 mg  80 mg Intravenous Once Gonzella Lex, MD      . succinylcholine (ANECTINE) injection 80 mg  80 mg Intravenous Once Gonzella Lex, MD        Musculoskeletal: Strength & Muscle Tone: within normal limits Gait & Station: Unable to assess. Patient leans: N/A  Psychiatric Specialty Exam: Physical Exam  Nursing note and vitals reviewed.   Review of Systems  Unable to perform ROS: medical condition    Blood pressure 83/55, pulse 59, temperature 97.7 F (36.5 C), temperature source Axillary, resp. rate 24, height _0  (1.575 m), weight 71.4 kg (157 lb 6.5 oz), SpO2 96 %.Body mass index is 28.78 kg/(m^2).  General Appearance: Casual  Eye Contact:  Minimal  Speech:  Slurred  Volume:  Normal  Mood:  Angry, Dysphoric and Irritable  Affect:  Congruent  Thought Process:  Disorganized  Orientation:  Other:  To person and place only  Thought Content:  Illogical  Suicidal Thoughts:  No  Homicidal Thoughts:  No  Memory:  Immediate;   Poor Recent;   Poor Remote;   Poor  Judgement:  Poor  Insight:  Lacking  Psychomotor Activity:  Increased  Concentration:  Concentration: Poor and Attention Span: Poor  Recall:  Poor  Fund of Knowledge:  Poor  Language:  Fair  Akathisia:  No  Handed:  Right  AIMS (if indicated):     Assets:  Desire for Improvement Financial Resources/Insurance Housing Resilience Social Support  ADL's:  Impaired  Cognition:  Impaired,  Mild  Sleep:         Treatment Plan Summary: Daily contact with patient to assess and evaluate symptoms and progress in treatment and Medication management  Disposition: No evidence of imminent risk to self or others at present.     Jenna Dudley Is a 80 year old female with a history of severe depression admitted to ICU for UTI delirium.  1. Mood. The patient was already restarted on Remeron, Zyprexa, Seroquel and Zoloft. Please continue.   2. Delirium. It is most likely delirium due to UTI. We will reassess depression daily.  3. Dr. Weber Cooks is back from vacation tomorrow. He knows the patient well. He will follow up.  Orson Slick, MD 10/04/2015 12:47 PM

## 2015-10-04 NOTE — Progress Notes (Addendum)
[facility] [place] [facility]   PATIENT NAME: Jenna Dudley    MR#:  [id number]  DATE OF BIRTH:  1935/04/29  SUBJECTIVE:  CHIEF COMPLAINT:   Chief Complaint  Patient presents with  . Altered Mental Status  sleepy but starts screaming when I touch belly,  REVIEW OF SYSTEMS:  Review of Systems  Unable to perform ROS: mental acuity   DRUG ALLERGIES:   Allergies  Allergen Reactions  . Asa [Aspirin] Other (See Comments)    Unknown reaction  . Sulfa Antibiotics Other (See Comments)    Unknown reaction   VITALS:  Blood pressure 110/60, pulse 70, temperature 97.8 F (36.6 C), temperature source Axillary, resp. rate 18, height 5\' 2"  (1.575 m), weight 71.4 kg (157 lb 6.5 oz), SpO2 100 %. PHYSICAL EXAMINATION:  Physical Exam  Constitutional: She is well-developed, well-nourished, and in no distress.  HENT:  Head: Normocephalic and atraumatic.  Eyes: Conjunctivae and EOM are normal. Pupils are equal, round, and reactive to light.  Neck: Normal range of motion. Neck supple. No tracheal deviation present. No thyromegaly present.  Cardiovascular: Normal rate, regular rhythm and normal heart sounds.   Pulmonary/Chest: Effort normal and breath sounds normal. No respiratory distress. She has no wheezes. She exhibits no tenderness.  Abdominal: Soft. Bowel sounds are normal. She exhibits no distension. There is no tenderness.  Musculoskeletal: Normal range of motion.  Neurological: She is alert. She is disoriented. No cranial nerve deficit.  Doesn't want to talk, agitated at times requiring precedex drip  Skin: Skin is warm and dry. No rash noted.  Psychiatric: Mood normal. Her affect is inappropriate. She is agitated. She expresses impulsivity. She exhibits disordered thought content.   LABORATORY PANEL:   CBC  Recent Labs Lab 10/04/15 0542  WBC 8.6  HGB 9.8*  HCT 28.9*  PLT 180    ------------------------------------------------------------------------------------------------------------------ Chemistries   Recent Labs Lab 10/03/15 0937  10/04/15 0542  NA 149*  < > 143  K 3.8  < > 3.5  CL 117*  < > 117*  CO2 19*  < > 23  GLUCOSE 99  < > 123*  BUN 34*  < > 29*  CREATININE 1.01*  < > 0.83  CALCIUM 9.3  < > 8.3*  MG 2.1  --   --   AST 65*  --   --   ALT 20  --   --   ALKPHOS 80  --   --   BILITOT 1.0  --   --   < > = values in this interval not displayed. RADIOLOGY:  No results found. ASSESSMENT AND PLAN:  #1. Sepsis: ruled out. #2. E. Coli UTI: IV rocephin, urine c/s growing e.coli, sensitivities pending #3. Hypernatremia: resolved with IV D5 half-normal #4. Lactic acidosis: resolved. #5. Acute renal insufficiency: resolved, likely prerenal. #6. Agitation, questionable metabolic encephalopathy, delirium versus psychiatric disease related, initiate patient on her psychiatric medications. appreciate psychiatrist input. Consider precedex drip for agitation if need, off earlier today #6. Leukocytosis: resolved     All the records are reviewed and case discussed with Care Management/Social Worker. Management plans discussed with the patient, family and they are in agreement.  CODE STATUS: FULL CODE  TOTAL TIME (critical care) TAKING CARE OF THIS PATIENT: 35 minutes.   More than 50% of the time was spent in counseling/coordination of care: YES  POSSIBLE D/C IN 1-2 DAYS, DEPENDING ON CLINICAL CONDITION.   [facility], Bernell Haynie M.D on 10/04/2015 at 4:04 PM  Between 7am to 6pm - Pager - [phone]  After 6pm go to [email or web address] - Social research officer, government  [facility]  Office  [phone]  CC: Primary care physician; ADEROJU, Marin Olp, MD  Note: This dictation was prepared with Dragon dictation along with smaller phrase technology. Any transcriptional errors that result from this process are unintentional.

## 2015-10-05 LAB — CBC
HCT: 31.5 % — ABNORMAL LOW (ref 35.0–47.0)
Hemoglobin: 10.8 g/dL — ABNORMAL LOW (ref 12.0–16.0)
MCH: 31 pg (ref 26.0–34.0)
MCHC: 34.3 g/dL (ref 32.0–36.0)
MCV: 90.3 fL (ref 80.0–100.0)
PLATELETS: 160 10*3/uL (ref 150–440)
RBC: 3.49 MIL/uL — AB (ref 3.80–5.20)
RDW: 14.8 % — ABNORMAL HIGH (ref 11.5–14.5)
WBC: 7 10*3/uL (ref 3.6–11.0)

## 2015-10-05 LAB — URINE CULTURE

## 2015-10-05 LAB — BASIC METABOLIC PANEL
ANION GAP: 5 (ref 5–15)
BUN: 15 mg/dL (ref 6–20)
CO2: 24 mmol/L (ref 22–32)
Calcium: 8.4 mg/dL — ABNORMAL LOW (ref 8.9–10.3)
Chloride: 110 mmol/L (ref 101–111)
Creatinine, Ser: 0.57 mg/dL (ref 0.44–1.00)
Glucose, Bld: 105 mg/dL — ABNORMAL HIGH (ref 65–99)
POTASSIUM: 4.1 mmol/L (ref 3.5–5.1)
SODIUM: 139 mmol/L (ref 135–145)

## 2015-10-05 MED ORDER — MORPHINE SULFATE (PF) 2 MG/ML IV SOLN
2.0000 mg | INTRAVENOUS | Status: DC | PRN
  Administered 2015-10-05 – 2015-10-06 (×5): 2 mg via INTRAVENOUS
  Filled 2015-10-05 (×6): qty 1

## 2015-10-05 MED ORDER — CETYLPYRIDINIUM CHLORIDE 0.05 % MT LIQD
7.0000 mL | Freq: Two times a day (BID) | OROMUCOSAL | Status: DC
  Administered 2015-10-05: 7 mL via OROMUCOSAL

## 2015-10-05 MED ORDER — OLANZAPINE 10 MG PO TBDP
20.0000 mg | ORAL_TABLET | Freq: Every day | ORAL | Status: DC
  Filled 2015-10-05 (×3): qty 2

## 2015-10-05 MED ORDER — POTASSIUM CL IN DEXTROSE 5% 20 MEQ/L IV SOLN
20.0000 meq | INTRAVENOUS | Status: DC
  Administered 2015-10-05 – 2015-10-07 (×6): 20 meq via INTRAVENOUS
  Filled 2015-10-05 (×8): qty 1000

## 2015-10-05 NOTE — Progress Notes (Signed)
[facility] - [place] at [facility]   PATIENT NAME: Keiana Tavella    MR#:  [id number]  DATE OF BIRTH:  Nov 25, 1935  SUBJECTIVE:  CHIEF COMPLAINT:   Chief Complaint  Patient presents with  . Altered Mental Status  c/o pain all over body (can't localize). Pt gets agitated very easily, off precedex drip more than 24 hrs REVIEW OF SYSTEMS:  Review of Systems  Constitutional: Positive for malaise/fatigue. Negative for fever, weight loss and diaphoresis.  HENT: Negative for ear discharge, ear pain, hearing loss, nosebleeds, sore throat and tinnitus.   Eyes: Negative for blurred vision and pain.  Respiratory: Negative for cough, hemoptysis, shortness of breath and wheezing.   Cardiovascular: Negative for chest pain, palpitations, orthopnea and leg swelling.  Gastrointestinal: Negative for heartburn, nausea, vomiting, abdominal pain, diarrhea, constipation and blood in stool.  Genitourinary: Negative for dysuria, urgency and frequency.  Musculoskeletal: Positive for myalgias and back pain.  Skin: Negative for itching and rash.  Neurological: Positive for weakness. Negative for dizziness, tingling, tremors, focal weakness, seizures and headaches.  Psychiatric/Behavioral: Negative for depression. The patient is nervous/anxious.    DRUG ALLERGIES:   Allergies  Allergen Reactions  . Asa [Aspirin] Other (See Comments)    Unknown reaction  . Sulfa Antibiotics Other (See Comments)    Unknown reaction   VITALS:  Blood pressure 131/87, pulse 87, temperature 97.8 F (36.6 C), temperature source Axillary, resp. rate 16, height  (1.575 m), weight 71.4 kg (157 lb 6.5 oz), SpO2 100 %. PHYSICAL EXAMINATION:  Physical Exam  Constitutional: She is well-developed, well-nourished, and in no distress.  HENT:  Head: Normocephalic and atraumatic.  Eyes: Conjunctivae and EOM are normal. Pupils are equal, round, and reactive to light.  Neck: Normal range of motion. Neck supple. No  tracheal deviation present. No thyromegaly present.  Cardiovascular: Normal rate, regular rhythm and normal heart sounds.   Pulmonary/Chest: Effort normal and breath sounds normal. No respiratory distress. She has no wheezes. She exhibits no tenderness.  Abdominal: Soft. Bowel sounds are normal. She exhibits no distension. There is no tenderness.  Musculoskeletal: Normal range of motion.  Neurological: She is alert. She is disoriented. No cranial nerve deficit.  Doesn't want to talk, agitated easily  Skin: Skin is warm and dry. No rash noted.  Psychiatric: Mood normal. Her affect is inappropriate. She is agitated. She expresses impulsivity. She exhibits disordered thought content.   LABORATORY PANEL:   CBC  Recent Labs Lab 10/05/15 0502  WBC 7.0  HGB 10.8*  HCT 31.5*  PLT 160   ------------------------------------------------------------------------------------------------------------------ Chemistries   Recent Labs Lab 10/03/15 0937  10/05/15 0502  NA 149*  < > 139  K 3.8  < > 4.1  CL 117*  < > 110  CO2 19*  < > 24  GLUCOSE 99  < > 105*  BUN 34*  < > 15  CREATININE 1.01*  < > 0.57  CALCIUM 9.3  < > 8.4*  MG 2.1  --   --   AST 65*  --   --   ALT 20  --   --   ALKPHOS 80  --   --   BILITOT 1.0  --   --   < > = values in this interval not displayed. RADIOLOGY:  No results found. ASSESSMENT AND PLAN:  #1. Sepsis: ruled out. #2. E. Coli UTI: change rocephin to PO keflex, urine c/s growing e.coli, pansensitive #3. Hypernatremia: resolved with IV D5 half-normal #4.  Lactic acidosis: resolved. #5. Acute renal insufficiency: resolved, likely prerenal. #6. Agitation, questionable metabolic encephalopathy, delirium versus psychiatric disease related, initiate patient on her psychiatric medications. appreciate psychiatrist input. Off precedex drip for more than 24 hrs #6. Leukocytosis: resolved   Can transfer to any med-surg with off unit tele  All the records are  reviewed And Management plans discussed with the nursing.  CODE STATUS: FULL CODE  TOTAL TIME TAKING CARE OF THIS PATIENT: 35 minutes.   More than 50% of the time was spent in counseling/coordination of care: YES  POSSIBLE D/C IN 1-2 DAYS, DEPENDING ON CLINICAL CONDITION.   [facility] Carm[facility] WestHAH, Cattleya Dobratz M.D on 10/05/2015 at 4:12 PM  Between 7am to 6pm - Pager - [phone]  After 6pm go to [email or web address] - Social research officer, governmentpassword EPAS [facility]  [facility] [place] [facility]  Office  [phone]  CC: Primary care physician; ADEROJU, Marin OlpELIZABETH OYEYEMI, MD  Note: This dictation was prepared with Dragon dictation along with smaller phrase technology. Any transcriptional errors that result from this process are unintentional.

## 2015-10-05 NOTE — Consult Note (Signed)
[facility] Face-to-Face Psychiatry Consult   Reason for Consult:  Consult for this 80 year old woman with a history of recurrent psychotic depression. Currently in the hospital with confusion and delirium Referring Physician:  Manuella Ghazi Patient Identification: Jenna Dudley MRN:  [id number] Principal Diagnosis: Acute encephalopathy Diagnosis:   Patient Active Problem List   Diagnosis Date Noted  . Acute encephalopathy [G93.40] 10/04/2015  . Sepsis ([place]) [A41.9] 10/03/2015  . Acute pyelonephritis [N10] 10/03/2015  . Hypernatremia [E87.0] 10/03/2015  . Lactic acidosis [E87.2] 10/03/2015  . Acute renal insufficiency [N28.9] 10/03/2015  . Leukocytosis [D72.829] 10/03/2015  . Agitation [R45.1] 10/03/2015  . Severe recurrent major depression without psychotic features ([place]) [F33.2]   . Major depressive disorder, recurrent episode, severe, with psychotic behavior ([place]) [F33.3] 10/13/2014  . Severe episode of recurrent major depressive disorder, with psychotic features ([place]) [F33.3] 10/13/2014  . Major depressive disorder, 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 features ([place]) [F33.2] 10/06/2014  . Infection of urinary tract [N39.0] 09/17/2014  . Essential hypertension [I10] 09/17/2014  . Chronic back pain [M54.9, G89.29] 09/17/2014  . Gastric reflux [K21.9] 09/17/2014  . History of stroke [Z86.73] 09/17/2014  . Hypertension [I10] 09/16/2014  . Severe recurrent major depression with psychotic features ([place]) [F33.3]   . 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 Hammer toe [M20.40] 09/12/2013  . Foot pain [M79.673] 09/12/2013  . Fungal infection of nail [B35.1] 09/12/2013  . Carpal tunnel syndrome [G56.00] 12/21/2010  . Chronic LBP [M54.5, G89.29] 12/21/2010  . Degenerative arthritis of lumbar spine [M47.816] 12/21/2010  . Clinical depression [F32.9] 12/21/2010  . 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 HLD (hyperlipidemia) [E78.5] 12/21/2010  . BP (high blood pressure) [I10]  12/21/2010  . Acid reflux [K21.9] 12/21/2010  . Spinal stenosis [M48.00] 12/21/2010    Total Time spent with patient: 1 hour  Subjective:   Jenna Dudley is a 80 y.o. female patient admitted with "I know you".  HPI:  Patient interviewed. Chart reviewed. Patient very well known to me from multiple prior encounters. She has been a maintenance ECT treatment patient for years and I also see her in my outpatient clinic. Patient dropped out of treatment with ECT and also stopped coming to her clinic appointments back around the beginning of the year. I had tried on more than one occasion to get in touch with her but had not received any call back. Patient presented to the hospital with confusion. There appears to be some suggestion that she had run out of her psychiatric medicine which is probably correct. Also appears to of had a urinary tract infection. On interview today the patient's not able to give any lucid history. She is speaking in a pressured tone and is very disorganized. Constantly returns to the themes of torture and [place].  Social history: Patient is married. Good relationship with her husband who is very familiar with her mental health issues. She has 5 grown daughters most of whom she is on good terms with.  Medical history: Patient has a history of hypertension coronary artery disease severe recurrent major depression urinary tract infections  Substance abuse history: No history of drug or alcohol abuse  Past Psychiatric History: In the time I've known her the patient has had chronic problems with fragile mood and psychotic symptoms. For years she had responded very well to maintenance ECT treatment although even then she had a few episodes of decompensation. When she gets depressed she will start saying obsessively negative things about herself and become very involuted. Current  presentation seems more manic-like which is not something I'm used to seeing in her as much. She did  well with a combination of both Zyprexa and quetiapine as well as Remeron at the listed doses and tolerated that well. Her whatever reason she stopped coming to her outpatient appointments. I was more concerned by that really then by the stopping ECT. I had long known that she was ambivalent about her ECT treatments although it was clear that they were very helpful for her. Stopping her medicine is more worrisome. It's not known to me whether she has a history of suicidal behavior certainly not in the time and I've known her. Has not been aggressive or violent others.  Risk to Self:   Risk to Others:   Prior Inpatient Therapy:   Prior Outpatient Therapy:    Past Medical History:  Past Medical History  Diagnosis Date  . Hypertension   . Hyperlipemia 07/29/14  . Chronic low back pain 07/29/14  . Spinal stenosis 07/29/14  . GERD (gastroesophageal reflux disease)   . Depression   . Esophageal spasm 07/29/14  . Bilateral carpal tunnel syndrome 07/27/14  . Degenerative arthritis of lumbar spine 07/27/14    Past Surgical History  Procedure Laterality Date  . Hip surgery Right   . Replacement total knee bilateral Bilateral 07/27/14  . Inner ear surgery Right    Family History:  Family History  Problem Relation Age of Onset  . Family history unknown: Yes   Family Psychiatric  History: There is a family history of anxiety and depression. Social History:  History  Alcohol Use No     History  Drug Use No    Social History   Social History  . Marital Status: Married    Spouse Name: N/A  . Number of Children: N/A  . Years of Education: N/A   Social History Main Topics  . Smoking status: Former Smoker    Types: Cigarettes    Quit date: 07/29/1975  . Smokeless tobacco: Never Used  . Alcohol Use: No  . Drug Use: No  . Sexual Activity: No     Comment: postmenopause   Other Topics Concern  . None   Social History Narrative   Additional Social History:    Allergies:   Allergies   Allergen Reactions  . Asa [Aspirin] Other (See Comments)    Unknown reaction  . Sulfa Antibiotics Other (See Comments)    Unknown reaction    Labs:  Results for orders placed or performed during the hospital encounter of 10/03/15 (from the past 48 hour(s))  Troponin I     Status: Abnormal   Collection Time: 10/04/15  1:25 AM  Result Value Ref Range   Troponin I 0.11 (HH) <0.03 ng/mL    Comment: CRITICAL VALUE NOTED. VALUE IS CONSISTENT WITH PREVIOUSLY REPORTED/CALLED VALUE.PMH  Sodium     Status: None   Collection Time: 10/04/15  1:25 AM  Result Value Ref Range   Sodium 144 135 - 145 mmol/L  Basic metabolic panel     Status: Abnormal   Collection Time: 10/04/15  5:42 AM  Result Value Ref Range   Sodium 143 135 - 145 mmol/L   Potassium 3.5 3.5 - 5.1 mmol/L   Chloride 117 (H) 101 - 111 mmol/L   CO2 23 22 - 32 mmol/L   Glucose, Bld 123 (H) 65 - 99 mg/dL   BUN 29 (H) 6 - 20 mg/dL   Creatinine, Ser 0.83 0.44 - 1.00 mg/dL  Calcium 8.3 (L) 8.9 - 10.3 mg/dL   GFR calc non Af Amer >60 >60 mL/min   GFR calc Af Amer >60 >60 mL/min    Comment: (NOTE) The eGFR has been calculated using the CKD EPI equation. This calculation has not been validated in all clinical situations. eGFR's persistently <60 mL/min signify possible Chronic Kidney Disease.    Anion gap 3 (L) 5 - 15  CBC     Status: Abnormal   Collection Time: 10/04/15  5:42 AM  Result Value Ref Range   WBC 8.6 3.6 - 11.0 K/uL   RBC 3.21 (L) 3.80 - 5.20 MIL/uL   Hemoglobin 9.8 (L) 12.0 - 16.0 g/dL   HCT 28.9 (L) 35.0 - 47.0 %   MCV 90.3 80.0 - 100.0 fL   MCH 30.6 26.0 - 34.0 pg   MCHC 33.9 32.0 - 36.0 g/dL   RDW 14.8 (H) 11.5 - 14.5 %   Platelets 180 150 - 440 K/uL  CBC     Status: Abnormal   Collection Time: 10/05/15  5:02 AM  Result Value Ref Range   WBC 7.0 3.6 - 11.0 K/uL   RBC 3.49 (L) 3.80 - 5.20 MIL/uL   Hemoglobin 10.8 (L) 12.0 - 16.0 g/dL   HCT 31.5 (L) 35.0 - 47.0 %   MCV 90.3 80.0 - 100.0 fL   MCH 31.0  26.0 - 34.0 pg   MCHC 34.3 32.0 - 36.0 g/dL   RDW 14.8 (H) 11.5 - 14.5 %   Platelets 160 150 - 440 K/uL  Basic metabolic panel     Status: Abnormal   Collection Time: 10/05/15  5:02 AM  Result Value Ref Range   Sodium 139 135 - 145 mmol/L   Potassium 4.1 3.5 - 5.1 mmol/L   Chloride 110 101 - 111 mmol/L   CO2 24 22 - 32 mmol/L   Glucose, Bld 105 (H) 65 - 99 mg/dL   BUN 15 6 - 20 mg/dL   Creatinine, Ser 0.57 0.44 - 1.00 mg/dL   Calcium 8.4 (L) 8.9 - 10.3 mg/dL   GFR calc non Af Amer >60 >60 mL/min   GFR calc Af Amer >60 >60 mL/min    Comment: (NOTE) The eGFR has been calculated using the CKD EPI equation. This calculation has not been validated in all clinical situations. eGFR's persistently <60 mL/min signify possible Chronic Kidney Disease.    Anion gap 5 5 - 15    Current Facility-Administered Medications  Medication Dose Route Frequency Provider Last Rate Last Dose  . acetaminophen (TYLENOL) tablet 650 mg  650 mg Oral Q6H PRN Theodoro Grist, MD       Or  . acetaminophen (TYLENOL) suppository 650 mg  650 mg Rectal Q6H PRN Theodoro Grist, MD      . acetaminophen-codeine (TYLENOL #3) 300-30 MG per tablet 1 tablet  1 tablet Oral Q8H PRN Theodoro Grist, MD   1 tablet at 10/04/15 0812  . antiseptic oral rinse (CPC / CETYLPYRIDINIUM CHLORIDE 0.05%) solution 7 mL  7 mL Mouth Rinse BID Vipul Shah, MD   7 mL at 10/05/15 1230  . aspirin chewable tablet 81 mg  81 mg Oral Daily Theodoro Grist, MD   81 mg at 10/04/15 0809  . cefTRIAXone (ROCEPHIN) 1 g in dextrose 5 % 50 mL IVPB  1 g Intravenous Q24H Theodoro Grist, MD   1 g at 10/05/15 0904  . chlorhexidine (PERIDEX) 0.12 % solution 15 mL  15 mL Mouth Rinse BID Theodoro Grist,  MD   15 mL at 10/05/15 0908  . dextrose 5 % with KCl 20 mEq / L  infusion  20 mEq Intravenous Continuous Max Sane, MD 125 mL/hr at 10/05/15 1900 20 mEq at 10/05/15 1900  . docusate sodium (COLACE) capsule 100 mg  100 mg Oral BID Theodoro Grist, MD   100 mg at 10/04/15 2113   . enoxaparin (LOVENOX) injection 40 mg  40 mg Subcutaneous Q24H Theodoro Grist, MD   40 mg at 10/04/15 2114  . mirtazapine (REMERON) tablet 45 mg  45 mg Oral QHS Theodoro Grist, MD   30 mg at 10/04/15 2113  . morphine 2 MG/ML injection 2 mg  2 mg Intravenous Q4H PRN Max Sane, MD   2 mg at 10/05/15 1359  . nystatin (MYCOSTATIN/NYSTOP) topical powder   Topical TID Awilda Bill, NP      . OLANZapine zydis (ZYPREXA) disintegrating tablet 20 mg  20 mg Oral QHS Gonzella Lex, MD      . ondansetron (ZOFRAN) tablet 4 mg  4 mg Oral Q6H PRN Theodoro Grist, MD       Or  . ondansetron (ZOFRAN) injection 4 mg  4 mg Intravenous Q6H PRN Theodoro Grist, MD      . pantoprazole (PROTONIX) EC tablet 40 mg  40 mg Oral Daily Theodoro Grist, MD   40 mg at 10/04/15 0820  . QUEtiapine (SEROQUEL) tablet 300 mg  300 mg Oral QHS Theodoro Grist, MD   300 mg at 10/04/15 2113  . sertraline (ZOLOFT) tablet 200 mg  200 mg Oral Hulen Skains, MD   200 mg at 10/04/15 0700  . simvastatin (ZOCOR) tablet 40 mg  40 mg Oral QHS Theodoro Grist, MD   40 mg at 10/04/15 2113  . sodium chloride flush (NS) 0.9 % injection 3 mL  3 mL Intravenous Q12H Theodoro Grist, MD   3 mL at 10/05/15 1000  . ziprasidone (GEODON) injection 10 mg  10 mg Intramuscular Q12H PRN Theodoro Grist, MD   10 mg at 10/05/15 2993   Facility-Administered Medications Ordered in Other Encounters  Medication Dose Route Frequency Provider Last Rate Last Dose  . labetalol (NORMODYNE,TRANDATE) injection 20 mg  20 mg Intravenous Once Gonzella Lex, MD      . labetalol (NORMODYNE,TRANDATE) injection 20 mg  20 mg Intravenous Once Gonzella Lex, MD      . lactated ringers infusion   Intravenous Continuous Gonzella Lex, MD      . methohexital Sodium 60 mg  60 mg Intravenous Once Gonzella Lex, MD      . methohexital Sodium 60 mg  60 mg Intravenous Once Gonzella Lex, MD      . succinylcholine (ANECTINE) injection 80 mg  80 mg Intravenous Once Gonzella Lex, MD       . succinylcholine (ANECTINE) injection 80 mg  80 mg Intravenous Once Gonzella Lex, MD        Musculoskeletal: Strength & Muscle Tone: decreased Gait & Station: unable to stand Patient leans: N/A  Psychiatric Specialty Exam: Physical Exam  Nursing note and vitals reviewed. Constitutional: She appears well-developed and well-nourished.  Patient had very dry lips. Looking sickly.  HENT:  Head: Normocephalic and atraumatic.  Eyes: Conjunctivae are normal. Pupils are equal, round, and reactive to light.  Neck: Normal range of motion.  Cardiovascular: Regular rhythm and normal heart sounds.   Respiratory: Effort normal.  GI: Soft.  Musculoskeletal: Normal range of motion.  Neurological:  She is alert. A cranial nerve deficit is present.  She has a right sided facial droop that is chronic.  Skin: Skin is warm and dry.  Psychiatric: Her affect is labile and inappropriate. Her speech is rapid and/or pressured. She is agitated. Thought content is paranoid. She expresses impulsivity. She expresses no suicidal ideation. She exhibits abnormal recent memory. She is inattentive.    Review of Systems  Constitutional: Negative.   HENT: Negative.   Eyes: Negative.   Respiratory: Negative.   Cardiovascular: Negative.   Gastrointestinal: Negative.   Musculoskeletal: Negative.   Skin: Negative.   Neurological: Negative.   Psychiatric/Behavioral: Negative for depression, suicidal ideas, hallucinations, memory loss and substance abuse. The patient is nervous/anxious and has insomnia.     Blood pressure 176/75, pulse 84, temperature 99.6 F (37.6 C), temperature source Axillary, resp. rate 33, height '5\' 2"'  (1.575 m), weight 71.4 kg (157 lb 6.5 oz), SpO2 100 %.Body mass index is 28.78 kg/(m^2).  General Appearance: Disheveled  Eye Contact:  Fair  Speech:  Garbled and Pressured  Volume:  Increased  Mood:  Irritable  Affect:  Inappropriate and Labile  Thought Process:  Disorganized and  Irrelevant  Orientation:  Negative  Thought Content:  Illogical, Paranoid Ideation, Rumination and Tangential  Suicidal Thoughts:  No  Homicidal Thoughts:  No  Memory:  Immediate;   Fair Recent;   Poor Remote;   Fair  Judgement:  Impaired  Insight:  Shallow  Psychomotor Activity:  Decreased  Concentration:  Concentration: Poor  Recall:  [employer] of Knowledge:  Fair  Language:  Fair  Akathisia:  No  Handed:  Right  AIMS (if indicated):     Assets:  Financial Resources/Insurance Housing Resilience Social Support  ADL's:  Impaired  Cognition:  Impaired,  Mild  Sleep:        Treatment Plan Summary: Daily contact with patient to assess and evaluate symptoms and progress in treatment, Medication management and Plan 80 year old woman with recurrent severe psychotic depression. Currently she has some features of possible mania although probably more just a delirium. Urinary tract infection has been a recurrent issue with her and has at times throwing her into confusion like this although this is worse than usual. Probably compounded by being noncompliant with her medicine. Patient was not mentally in any condition for me to rationally discuss treatment. I'm glad to see that she is on her usual medicine. I did make the adjustment of changing the apraxia to the orally disintegrating to make that easily administered. Tried to give her some reassurance as best I could. I will follow-up regularly with her.  Disposition: Patient does not meet criteria for psychiatric inpatient admission. Supportive therapy provided about ongoing stressors.  Alethia Berthold, MD 10/05/2015 9:46 PM

## 2015-10-05 NOTE — Progress Notes (Signed)
PT Cancellation Note  Patient Details Name: Jenna Dudley MRN: [id number] DOB: December 11, 1935   Cancelled Treatment:    Reason Eval/Treat Not Completed: Patient's level of consciousness Attempted to see pt this date, she was wearing mits, showed no ability to comprehend what PT was therefore and rambled incoherently about [place], Dan HumphreysWalker, [employer], school and other off topic "ruminations." Pt will only be appropriate for PT if her mental status improves.   Malachi ProGalen R Vivan Agostino, DPT 10/05/2015, 11:37 AM

## 2015-10-05 NOTE — Progress Notes (Signed)
Pt has continued to be alert and disoriented x 4 with c/o pain with touch. It is noted that the patient has chronic back pain; however, it is unclear where her pain at this time is originating. Pt is unable to follow commands or hold conversation. Pt is agitated with stimuli and combative with care. Geodon has been given x 1 with slight decrease in agitation. Lung sounds are clear to auscultation. Resp are even and unlabored on RA. NSR on cardiac monitor.

## 2015-10-06 NOTE — Consult Note (Signed)
[facility] Face-to-Face Psychiatry Consult   Reason for Consult:  Consult for this 80 year old woman with a history of recurrent psychotic depression. Currently in the hospital with confusion and delirium Referring Physician:  Manuella Ghazi Patient Identification: RAMIYA DELAHUNTY MRN:  [id number] Principal Diagnosis: Acute encephalopathy Diagnosis:   Patient Active Problem List   Diagnosis Date Noted  . Acute encephalopathy [G93.40] 10/04/2015  . Sepsis ([place]) [A41.9] 10/03/2015  . Acute pyelonephritis [N10] 10/03/2015  . Hypernatremia [E87.0] 10/03/2015  . Lactic acidosis [E87.2] 10/03/2015  . Acute renal insufficiency [N28.9] 10/03/2015  . Leukocytosis [D72.829] 10/03/2015  . Agitation [R45.1] 10/03/2015  . Severe recurrent major depression without psychotic features ([place]) [F33.2]   . Major depressive disorder, recurrent episode, severe, with psychotic behavior ([place]) [F33.3] 10/13/2014  . Severe episode of recurrent major depressive disorder, with psychotic features ([place]) [F33.3] 10/13/2014  . Major depressive disorder, 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 features ([place]) [F33.2] 10/06/2014  . Infection of urinary tract [N39.0] 09/17/2014  . Essential hypertension [I10] 09/17/2014  . Chronic back pain [M54.9, G89.29] 09/17/2014  . Gastric reflux [K21.9] 09/17/2014  . History of stroke [Z86.73] 09/17/2014  . Hypertension [I10] 09/16/2014  . Severe recurrent major depression with psychotic features ([place]) [F33.3]   . 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 Hammer toe [M20.40] 09/12/2013  . Foot pain [M79.673] 09/12/2013  . Fungal infection of nail [B35.1] 09/12/2013  . Carpal tunnel syndrome [G56.00] 12/21/2010  . Chronic LBP [M54.5, G89.29] 12/21/2010  . Degenerative arthritis of lumbar spine [M47.816] 12/21/2010  . Clinical depression [F32.9] 12/21/2010  . 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 HLD (hyperlipidemia) [E78.5] 12/21/2010  . BP (high blood pressure) [I10]  12/21/2010  . Acid reflux [K21.9] 12/21/2010  . Spinal stenosis [M48.00] 12/21/2010    Total Time spent with patient: 20 minutes  Subjective:   RENLEY GUTMAN is a 80 y.o. female patient admitted with "I know you".  Follow-up for Wednesday, July 12. Patient seen. Also got to speak to her husband at bedside. Chart reviewed. Patient continues to be disorganized with flight of ideas, grandiosity, emotional dysregulation. She is slightly better than yesterday in that I could interrupt her and a little bit more of her speech made sense but she is still clearly not well. Her husband was able to give me more information. Apparently this episode of mania has been going on for several weeks, long before she ran out of her medicine. Part of it may have been from having a urinary tract infection but at this point it looks like this is a mania in addition to delirium that he is taking on a life of its own.  HPI:  Patient interviewed. Chart reviewed. Patient very well known to me from multiple prior encounters. She has been a maintenance ECT treatment patient for years and I also see her in my outpatient clinic. Patient dropped out of treatment with ECT and also stopped coming to her clinic appointments back around the beginning of the year. I had tried on more than one occasion to get in touch with her but had not received any call back. Patient presented to the hospital with confusion. There appears to be some suggestion that she had run out of her psychiatric medicine which is probably correct. Also appears to of had a urinary tract infection. On interview today the patient's not able to give any lucid history. She is speaking in a pressured tone and is very disorganized. Constantly returns to the themes of torture and [place]  Bella Kennedy.  Social history: Patient is married. Good relationship with her husband who is very familiar with her mental health issues. She has 5 grown daughters most of whom she is on good terms  with.  Medical history: Patient has a history of hypertension coronary artery disease severe recurrent major depression urinary tract infections  Substance abuse history: No history of drug or alcohol abuse  Past Psychiatric History: In the time I've known her the patient has had chronic problems with fragile mood and psychotic symptoms. For years she had responded very well to maintenance ECT treatment although even then she had a few episodes of decompensation. When she gets depressed she will start saying obsessively negative things about herself and become very involuted. Current presentation seems more manic-like which is not something I'm used to seeing in her as much. She did well with a combination of both Zyprexa and quetiapine as well as Remeron at the listed doses and tolerated that well. Her whatever reason she stopped coming to her outpatient appointments. I was more concerned by that really then by the stopping ECT. I had long known that she was ambivalent about her ECT treatments although it was clear that they were very helpful for her. Stopping her medicine is more worrisome. It's not known to me whether she has a history of suicidal behavior certainly not in the time and I've known her. Has not been aggressive or violent others.  Risk to Self:   Risk to Others:   Prior Inpatient Therapy:   Prior Outpatient Therapy:    Past Medical History:  Past Medical History  Diagnosis Date  . Hypertension   . Hyperlipemia 07/29/14  . Chronic low back pain 07/29/14  . Spinal stenosis 07/29/14  . GERD (gastroesophageal reflux disease)   . Depression   . Esophageal spasm 07/29/14  . Bilateral carpal tunnel syndrome 07/27/14  . Degenerative arthritis of lumbar spine 07/27/14    Past Surgical History  Procedure Laterality Date  . Hip surgery Right   . Replacement total knee bilateral Bilateral 07/27/14  . Inner ear surgery Right    Family History:  Family History  Problem Relation Age of Onset   . Family history unknown: Yes   Family Psychiatric  History: There is a family history of anxiety and depression. Social History:  History  Alcohol Use No     History  Drug Use No    Social History   Social History  . Marital Status: Married    Spouse Name: N/A  . Number of Children: N/A  . Years of Education: N/A   Social History Main Topics  . Smoking status: Former Smoker    Types: Cigarettes    Quit date: 07/29/1975  . Smokeless tobacco: Never Used  . Alcohol Use: No  . Drug Use: No  . Sexual Activity: No     Comment: postmenopause   Other Topics Concern  . None   Social History Narrative   Additional Social History:    Allergies:   Allergies  Allergen Reactions  . Asa [Aspirin] Other (See Comments)    Unknown reaction  . Sulfa Antibiotics Other (See Comments)    Unknown reaction    Labs:  Results for orders placed or performed during the hospital encounter of 10/03/15 (from the past 48 hour(s))  CBC     Status: Abnormal   Collection Time: 10/05/15  5:02 AM  Result Value Ref Range   WBC 7.0 3.6 - 11.0 K/uL   RBC  3.49 (L) 3.80 - 5.20 MIL/uL   Hemoglobin 10.8 (L) 12.0 - 16.0 g/dL   HCT 31.5 (L) 35.0 - 47.0 %   MCV 90.3 80.0 - 100.0 fL   MCH 31.0 26.0 - 34.0 pg   MCHC 34.3 32.0 - 36.0 g/dL   RDW 14.8 (H) 11.5 - 14.5 %   Platelets 160 150 - 440 K/uL  Basic metabolic panel     Status: Abnormal   Collection Time: 10/05/15  5:02 AM  Result Value Ref Range   Sodium 139 135 - 145 mmol/L   Potassium 4.1 3.5 - 5.1 mmol/L   Chloride 110 101 - 111 mmol/L   CO2 24 22 - 32 mmol/L   Glucose, Bld 105 (H) 65 - 99 mg/dL   BUN 15 6 - 20 mg/dL   Creatinine, Ser 0.57 0.44 - 1.00 mg/dL   Calcium 8.4 (L) 8.9 - 10.3 mg/dL   GFR calc non Af Amer >60 >60 mL/min   GFR calc Af Amer >60 >60 mL/min    Comment: (NOTE) The eGFR has been calculated using the CKD EPI equation. This calculation has not been validated in all clinical situations. eGFR's persistently <60  mL/min signify possible Chronic Kidney Disease.    Anion gap 5 5 - 15    Current Facility-Administered Medications  Medication Dose Route Frequency Provider Last Rate Last Dose  . acetaminophen (TYLENOL) tablet 650 mg  650 mg Oral Q6H PRN Theodoro Grist, MD       Or  . acetaminophen (TYLENOL) suppository 650 mg  650 mg Rectal Q6H PRN Theodoro Grist, MD      . acetaminophen-codeine (TYLENOL #3) 300-30 MG per tablet 1 tablet  1 tablet Oral Q8H PRN Theodoro Grist, MD   1 tablet at 10/04/15 0812  . antiseptic oral rinse (CPC / CETYLPYRIDINIUM CHLORIDE 0.05%) solution 7 mL  7 mL Mouth Rinse BID Vipul Shah, MD   7 mL at 10/05/15 1230  . chlorhexidine (PERIDEX) 0.12 % solution 15 mL  15 mL Mouth Rinse BID Theodoro Grist, MD   15 mL at 10/05/15 0908  . dextrose 5 % with KCl 20 mEq / L  infusion  20 mEq Intravenous Continuous Max Sane, MD 125 mL/hr at 10/06/15 1111 20 mEq at 10/06/15 1111  . docusate sodium (COLACE) capsule 100 mg  100 mg Oral BID Theodoro Grist, MD   100 mg at 10/04/15 2113  . enoxaparin (LOVENOX) injection 40 mg  40 mg Subcutaneous Q24H Theodoro Grist, MD   40 mg at 10/04/15 2114  . mirtazapine (REMERON) tablet 45 mg  45 mg Oral QHS Theodoro Grist, MD   30 mg at 10/04/15 2113  . morphine 2 MG/ML injection 2 mg  2 mg Intravenous Q4H PRN Max Sane, MD   2 mg at 10/06/15 1618  . nystatin (MYCOSTATIN/NYSTOP) topical powder   Topical TID Awilda Bill, NP      . OLANZapine zydis (ZYPREXA) disintegrating tablet 20 mg  20 mg Oral QHS Gonzella Lex, MD   20 mg at 10/05/15 2311  . ondansetron (ZOFRAN) tablet 4 mg  4 mg Oral Q6H PRN Theodoro Grist, MD       Or  . ondansetron (ZOFRAN) injection 4 mg  4 mg Intravenous Q6H PRN Theodoro Grist, MD      . pantoprazole (PROTONIX) EC tablet 40 mg  40 mg Oral Daily Theodoro Grist, MD   40 mg at 10/04/15 0820  . QUEtiapine (SEROQUEL) tablet 300 mg  300 mg  Oral QHS Theodoro Grist, MD   300 mg at 10/04/15 2113  . sertraline (ZOLOFT) tablet 200 mg  200 mg  Oral Hulen Skains, MD   200 mg at 10/04/15 0700  . simvastatin (ZOCOR) tablet 40 mg  40 mg Oral QHS Theodoro Grist, MD   40 mg at 10/04/15 2113  . sodium chloride flush (NS) 0.9 % injection 3 mL  3 mL Intravenous Q12H Theodoro Grist, MD   3 mL at 10/05/15 1000  . ziprasidone (GEODON) injection 10 mg  10 mg Intramuscular Q12H PRN Theodoro Grist, MD   10 mg at 10/05/15 6754   Facility-Administered Medications Ordered in Other Encounters  Medication Dose Route Frequency Provider Last Rate Last Dose  . labetalol (NORMODYNE,TRANDATE) injection 20 mg  20 mg Intravenous Once Gonzella Lex, MD      . labetalol (NORMODYNE,TRANDATE) injection 20 mg  20 mg Intravenous Once Gonzella Lex, MD      . lactated ringers infusion   Intravenous Continuous Gonzella Lex, MD      . methohexital Sodium 60 mg  60 mg Intravenous Once Gonzella Lex, MD      . methohexital Sodium 60 mg  60 mg Intravenous Once Gonzella Lex, MD      . succinylcholine (ANECTINE) injection 80 mg  80 mg Intravenous Once Gonzella Lex, MD      . succinylcholine (ANECTINE) injection 80 mg  80 mg Intravenous Once Gonzella Lex, MD        Musculoskeletal: Strength & Muscle Tone: decreased Gait & Station: unable to stand Patient leans: N/A  Psychiatric Specialty Exam: Physical Exam  Nursing note and vitals reviewed. Constitutional: She appears well-developed and well-nourished.  Patient had very dry lips. Looking sickly.  HENT:  Head: Normocephalic and atraumatic.  Eyes: Conjunctivae are normal. Pupils are equal, round, and reactive to light.  Neck: Normal range of motion.  Cardiovascular: Regular rhythm and normal heart sounds.   Respiratory: Effort normal.  GI: Soft.  Musculoskeletal: Normal range of motion.  Neurological: She is alert. A cranial nerve deficit is present.  She has a right sided facial droop that is chronic.  Skin: Skin is warm and dry.  Psychiatric: Her affect is labile and inappropriate. Her speech  is rapid and/or pressured. She is agitated. Thought content is paranoid. She expresses impulsivity. She expresses no suicidal ideation. She exhibits abnormal recent memory. She is inattentive.    Review of Systems  Constitutional: Negative.   HENT: Negative.   Eyes: Negative.   Respiratory: Negative.   Cardiovascular: Negative.   Gastrointestinal: Negative.   Musculoskeletal: Negative.   Skin: Negative.   Neurological: Negative.   Psychiatric/Behavioral: Negative for depression, suicidal ideas, hallucinations, memory loss and substance abuse. The patient is nervous/anxious and has insomnia.     Blood pressure 157/73, pulse 79, temperature 98.1 F (36.7 C), temperature source Oral, resp. rate 18, height '5\' 2"'  (1.575 m), weight 71.4 kg (157 lb 6.5 oz), SpO2 97 %.Body mass index is 28.78 kg/(m^2).  General Appearance: Disheveled  Eye Contact:  Fair  Speech:  Garbled and Pressured  Volume:  Increased  Mood:  Irritable  Affect:  Inappropriate and Labile  Thought Process:  Disorganized and Irrelevant  Orientation:  Negative  Thought Content:  Illogical, Paranoid Ideation, Rumination and Tangential  Suicidal Thoughts:  No  Homicidal Thoughts:  No  Memory:  Immediate;   Fair Recent;   Poor Remote;   Fair  Judgement:  Impaired  Insight:  Shallow  Psychomotor Activity:  Decreased  Concentration:  Concentration: Poor  Recall:  [employer] of Knowledge:  Fair  Language:  Fair  Akathisia:  No  Handed:  Right  AIMS (if indicated):     Assets:  Financial Resources/Insurance Housing Resilience Social Support  ADL's:  Impaired  Cognition:  Impaired,  Mild  Sleep:        Treatment Plan Summary: Daily contact with patient to assess and evaluate symptoms and progress in treatment, Medication management and Plan The patient's husband, Carloyn Manner, is very familiar with her treatment and is strongly in favor of restarting ECT. At this point I think that is definitely what is indicated. ECT is  indicated for treating mania as well as depression. We can adjust her medicine, and I do plan to increase her dose of Zyprexa, but the ECT should accelerate her improvement. Patient was able to at least acknowledge a basic agreement with the plan. We will schedule her for Friday. If she needs to continue on the medicine service we can continue ECT while she is here. If she is medically improved enough but still might need psychiatric treatment we can consider the possibility of transfer down stairs or negotiate another solution. I will ask the ECT nursing staff to put her on the schedule and I will put in orders. I will follow-up with her tomorrow.  Disposition: Patient does not meet criteria for psychiatric inpatient admission. Supportive therapy provided about ongoing stressors.  Alethia Berthold, MD 10/06/2015 6:41 PM

## 2015-10-06 NOTE — Progress Notes (Signed)
PT Cancellation Note  Patient Details Name: Jenna Dudley MRN: [id number] DOB: Dec 26, 1935   Cancelled Treatment:    Reason Eval/Treat Not Completed: Patient's level of consciousness. Discussed case with RN after chart review. Patient is currently asleep, has been combative and agitated when alert. RN asked to have PT re-attempt in the afternoon. If patient is still unable to participate in therapy evaluation, will discontinue order and await new order once patient is more aware and able to follow instructions.   Kerin RansomPatrick A McNamara, PT, DPT    10/06/2015, 9:30 AM

## 2015-10-06 NOTE — Progress Notes (Signed)
[facility] [place] [facility]   PATIENT NAME: Jenna BrackettJoan [place]    MR#:  [id number]  DATE OF BIRTH:  11/29/1935  SUBJECTIVE:  CHIEF COMPLAINT:   Chief Complaint  Patient presents with  . Altered Mental Status  much more alert today although still very confused, agitated REVIEW OF SYSTEMS:  Review of Systems  Constitutional: Positive for malaise/fatigue. Negative for fever, weight loss and diaphoresis.  HENT: Negative for ear discharge, ear pain, hearing loss, nosebleeds, sore throat and tinnitus.   Eyes: Negative for blurred vision and pain.  Respiratory: Negative for cough, hemoptysis, shortness of breath and wheezing.   Cardiovascular: Negative for chest pain, palpitations, orthopnea and leg swelling.  Gastrointestinal: Negative for heartburn, nausea, vomiting, abdominal pain, diarrhea, constipation and blood in stool.  Genitourinary: Negative for dysuria, urgency and frequency.  Musculoskeletal: Positive for myalgias and back pain.  Skin: Negative for itching and rash.  Neurological: Positive for weakness. Negative for dizziness, tingling, tremors, focal weakness, seizures and headaches.  Psychiatric/Behavioral: Negative for depression. The patient is nervous/anxious.    DRUG ALLERGIES:   Allergies  Allergen Reactions  . Asa [Aspirin] Other (See Comments)    Unknown reaction  . Sulfa Antibiotics Other (See Comments)    Unknown reaction   VITALS:  Blood pressure 157/73, pulse 79, temperature 98.1 F (36.7 C), temperature source Oral, resp. rate 18, height 5\' 2"  (1.575 m), weight 71.4 kg (157 lb 6.5 oz), SpO2 97 %. PHYSICAL EXAMINATION:  Physical Exam  Constitutional: She is well-developed, well-nourished, and in no distress.  HENT:  Head: Normocephalic and atraumatic.  Eyes: Conjunctivae and EOM are normal. Pupils are equal, round, and reactive to light.  Neck: Normal range of motion. Neck supple. No tracheal deviation present. No thyromegaly  present.  Cardiovascular: Normal rate, regular rhythm and normal heart sounds.   Pulmonary/Chest: Effort normal and breath sounds normal. No respiratory distress. She has no wheezes. She exhibits no tenderness.  Abdominal: Soft. Bowel sounds are normal. She exhibits no distension. There is no tenderness.  Musculoskeletal: Normal range of motion.  Neurological: She is alert. She is disoriented. No cranial nerve deficit.  Doesn't want to talk, agitated easily  Skin: Skin is warm and dry. No rash noted.  Psychiatric: Mood normal. Her affect is inappropriate. She is agitated. She expresses impulsivity. She exhibits disordered thought content.   LABORATORY PANEL:   CBC  Recent Labs Lab 10/05/15 0502  WBC 7.0  HGB 10.8*  HCT 31.5*  PLT 160   ------------------------------------------------------------------------------------------------------------------ Chemistries   Recent Labs Lab 10/03/15 0937  10/05/15 0502  NA 149*  < > 139  K 3.8  < > 4.1  CL 117*  < > 110  CO2 19*  < > 24  GLUCOSE 99  < > 105*  BUN 34*  < > 15  CREATININE 1.01*  < > 0.57  CALCIUM 9.3  < > 8.4*  MG 2.1  --   --   AST 65*  --   --   ALT 20  --   --   ALKPHOS 80  --   --   BILITOT 1.0  --   --   < > = values in this interval not displayed. RADIOLOGY:  No results found. ASSESSMENT AND PLAN:  80 y.o. female with a known history of multiple medical problems including essential hypertension, hyperlipidemia, chronic low back pain, major depressive disorder, psychotic behavior, who was on ECT therapy, treated by Dr. Toni Amendlapacs in past admitted  for agitation initially required step-down for precedex drip  * Acute Metabolic encephalopathy, delirium versus recurrent severe psychotic depression with acute Mania, Not taking any PO meds. appreciate psychiatrist input. Off precedex drip for more than 48 hrs * E. Coli UTI: finished course. * Hypernatremia: resolved with IV D5 half-normal * Lactic acidosis:  resolved. * Acute renal insufficiency: resolved, likely prerenal. * Leukocytosis: resolved * Sepsis: ruled out.  May need Gero-Psych Unit, unable to participate in therapy  All the records are reviewed And Management plans discussed with the nursing.  CODE STATUS: FULL CODE  TOTAL TIME TAKING CARE OF THIS PATIENT: 35 minutes.   More than 50% of the time was spent in counseling/coordination of care: YES  POSSIBLE D/C IN 2-3 DAYS, DEPENDING ON CLINICAL CONDITION. And Mental status   [facility], Fortino Haag M.D on 10/06/2015 at 12:47 PM  Between 7am to 6pm - Pager - [phone]  After 6pm go to [email or web address] - Social research officer, government  [facility]  Hospitalists  Office  [phone]  CC: Primary care physician; ADEROJU, Marin Olp, MD  Note: This dictation was prepared with Dragon dictation along with smaller phrase technology. Any transcriptional errors that result from this process are unintentional.

## 2015-10-06 NOTE — Care Management Important Message (Signed)
Important Message  Patient Details  Name: Jenna Dudley MRN: [id number] Date of Birth: 04-19-1935   Medicare Important Message Given:  Yes    Olegario MessierKathy A Shianne Zeiser 10/06/2015, 2:34 PM

## 2015-10-06 NOTE — Progress Notes (Signed)
Per Dr. Sherryll BurgerShah pt has an allergy to aspirin so it will be discontinued

## 2015-10-06 NOTE — Evaluation (Signed)
Physical Therapy Evaluation Patient Details Name: Jenna Dudley MRN: [id number] DOB: 05-19-1935 Today's Date: 10/06/2015   History of Present Illness  Pt is a 80 y/o female that presents with acute delerium, underlying UTI and non-compliance with home psychiatric medications. Started on precedex drip as she was combative, agitated initially.   Clinical Impression  Pt presents with delerium and weakness. Per RN notes, pt was independent with all ADLs prior to admission. Pt was independent with bed mobility but required B UE to pull herself to edge of bed. Pt was incoherently rambling and was not able to follow simple commands at this point. Pt started to become more agitated and paranoid when PT brought out RW. Pt states she walks with cane at home and will not use RW, though pt is a unreliable historian throughout session. Pt would not be appropriate for STR at this time due to cognitive status and would not be appropriate to return home physically based on evaluation. At this time pt will require some level of care due to cognitive status and inability to participate with PT. PT will continue to follow pt throughout admission.     Follow Up Recommendations  (At this point she is not cognitively appropriate for SNF. She is not fit to return home either. At this point would recommend something like geri-psych unit until she is more cognitively capable of participating in therapy. )    Equipment Recommendations       Recommendations for Other Services       Precautions / Restrictions Precautions Precautions: Fall Restrictions Weight Bearing Restrictions: No      Mobility  Bed Mobility Overal bed mobility: Independent             General bed mobility comments: Patient is able to transfer supine <--> sit without PT assistance.   Transfers                 General transfer comment: Deferred as patient began to demonstrate increasing paranoia and confusion.    Ambulation/Gait                Stairs            Wheelchair Mobility    Modified Rankin (Stroke Patients Only)       Balance Overall balance assessment: Needs assistance Sitting-balance support: No upper extremity supported Sitting balance-Leahy Scale: Good                                       Pertinent Vitals/Pain Pain Assessment:  (She has been reporting pain, though not cognitively aware enough to discern where)    Home Living Family/patient expects to be discharged to:: Private residence Living Arrangements: Spouse/significant other                    Prior Function           Comments: Per nursing notes she has been independent previously.      Hand Dominance        Extremity/Trunk Assessment   Upper Extremity Assessment: Difficult to assess due to impaired cognition           Lower Extremity Assessment: Difficult to assess due to impaired cognition         Communication   Communication:  (Patient rambles incoherently. )  Cognition Arousal/Alertness: Awake/alert Behavior During Therapy: Restless;Agitated Overall Cognitive Status: Impaired/Different from baseline  Area of Impairment: Orientation;Attention;Following commands;Awareness Orientation Level: Disoriented to;Person;Place;Time;Situation   Memory: Decreased short-term memory Following Commands: Follows one step commands inconsistently       General Comments: Patient rambles incoherently, appears paranoid calling therapist a murderer, discussing monetary/financial information, reverting back to "[employer]" repeatedly.     General Comments General comments (skin integrity, edema, etc.): Mits in place on bilateral UEs.     Exercises        Assessment/Plan    PT Assessment Patient needs continued PT services  PT Diagnosis Difficulty walking;Generalized weakness;Altered mental status   PT Problem List Decreased strength;Decreased  safety awareness;Decreased knowledge of use of DME;Decreased balance;Decreased cognition;Decreased mobility  PT Treatment Interventions DME instruction;Gait training;Stair training;Therapeutic activities;Therapeutic exercise;Balance training;Cognitive remediation   PT Goals (Current goals can be found in the Care Plan section) Acute Rehab PT Goals PT Goal Formulation: Patient unable to participate in goal setting Time For Goal Achievement: 10/20/15 Potential to Achieve Goals: Fair    Frequency Min 2X/week   Barriers to discharge        Co-evaluation               End of Session Equipment Utilized During Treatment:  (Mits) Activity Tolerance: Treatment limited secondary to agitation (Limited by cognitive status) Patient left: in bed;with call bell/phone within reach;with bed alarm set Nurse Communication: Mobility status (Cognitive status)         Time: 1100-1110 PT Time Calculation (min) (ACUTE ONLY): 10 min   Charges:   PT Evaluation $PT Eval Low Complexity: 1 Procedure     PT G Codes:       Kerin RansomPatrick A Kairie Vangieson, PT, DPT    10/06/2015, 11:17 AM

## 2015-10-07 LAB — GLUCOSE, CAPILLARY: GLUCOSE-CAPILLARY: 108 mg/dL — AB (ref 65–99)

## 2015-10-07 MED ORDER — SODIUM CHLORIDE 0.9 % IV SOLN
250.0000 mL | Freq: Once | INTRAVENOUS | Status: AC
  Administered 2015-10-08: 12:00:00 via INTRAVENOUS

## 2015-10-07 MED ORDER — OLANZAPINE 10 MG IM SOLR
15.0000 mg | Freq: Every day | INTRAMUSCULAR | Status: DC
Start: 2015-10-07 — End: 2015-10-15
  Administered 2015-10-07 – 2015-10-13 (×6): 15 mg via INTRAMUSCULAR
  Filled 2015-10-07 (×10): qty 20

## 2015-10-07 MED ORDER — DEXTROSE-NACL 5-0.45 % IV SOLN
INTRAVENOUS | Status: DC
  Administered 2015-10-07 – 2015-10-08 (×2): via INTRAVENOUS

## 2015-10-07 NOTE — Progress Notes (Signed)
PT Cancellation Note  Patient Details Name: Jenna Dudley MRN: [id number] DOB: 1935/11/18   Cancelled Treatment:    Reason Eval/Treat Not Completed: Other (comment) (Severe confusion). Attempted to speak with nursing before attempting treatment; however, nurse busy with another patient. Treatment attempted; pt severely confused rambling about one subject to another unrelated to pt condition or hospital stay. Pt unable to answer any questions completely disregarding question/comments, but carrying on own conversation. Pt wearing hand mitts. Pt unable to participate in PT; recommend completing current PT order. Please re consult PT should pt's metal acuity improve to allow participation.    Kristeen MissHeidi Elizabeth Bishop, [place]PTA 10/07/2015, 3:06 PM

## 2015-10-07 NOTE — Consult Note (Signed)
[facility] Face-to-Face Psychiatry Consult   Reason for Consult:  Consult for this 80 year old woman with a history of recurrent psychotic depression. Currently in the hospital with confusion and delirium Referring Physician:  Sherryll Burger Patient Identification: Jenna Dudley MRN:  [id number] Principal Diagnosis: Major depression recurrent severe with psychotic features Diagnosis:   Patient Active Problem List   Diagnosis Date Noted  . Acute encephalopathy [G93.40] 10/04/2015  . Sepsis (HCC) [A41.9] 10/03/2015  . Acute pyelonephritis [N10] 10/03/2015  . Hypernatremia [E87.0] 10/03/2015  . Lactic acidosis [E87.2] 10/03/2015  . Acute renal insufficiency [N28.9] 10/03/2015  . Leukocytosis [D72.829] 10/03/2015  . Agitation [R45.1] 10/03/2015  . Severe recurrent major depression without psychotic features (HCC) [F33.2]   . Major depressive disorder, recurrent episode, severe, with psychotic behavior (HCC) [F33.3] 10/13/2014  . Severe episode of recurrent major depressive disorder, with psychotic features (HCC) [F33.3] 10/13/2014  . Major depressive disorder, 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 features (HCC) [F33.2] 10/06/2014  . Infection of urinary tract [N39.0] 09/17/2014  . Essential hypertension [I10] 09/17/2014  . Chronic back pain [M54.9, G89.29] 09/17/2014  . Gastric reflux [K21.9] 09/17/2014  . History of stroke [Z86.73] 09/17/2014  . Hypertension [I10] 09/16/2014  . Severe recurrent major depression with psychotic features (HCC) [F33.3]   . 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 Hammer toe [M20.40] 09/12/2013  . Foot pain [M79.673] 09/12/2013  . Fungal infection of nail [B35.1] 09/12/2013  . Carpal tunnel syndrome [G56.00] 12/21/2010  . Chronic LBP [M54.5, G89.29] 12/21/2010  . Degenerative arthritis of lumbar spine [M47.816] 12/21/2010  . Clinical depression [F32.9] 12/21/2010  . 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 HLD (hyperlipidemia) [E78.5] 12/21/2010  . BP  (high blood pressure) [I10] 12/21/2010  . Acid reflux [K21.9] 12/21/2010  . Spinal stenosis [M48.00] 12/21/2010    Total Time spent with patient: 20 minutes  Subjective:   Jenna Dudley is a 80 y.o. female patient admitted with "I know you".  Follow-up for Thursday the 13th. Patient seen chart reviewed. Spoke with hospitalist. Patient is still disorganized confused with tangential thinking although she is less agitated than she was yesterday. She no she is in the hospital. She immediately recognizes me and has some appropriate conversation. Does not seem to be delirious so much is still manic. Still very much off of her baseline. I'm informed by Dr.Kalisetti that the patient has been refusing her oral medications. I will discontinue the oral Zyprexa and give that as an IM for now.  HPI:  Patient interviewed. Chart reviewed. Patient very well known to me from multiple prior encounters. She has been a maintenance ECT treatment patient for years and I also see her in my outpatient clinic. Patient dropped out of treatment with ECT and also stopped coming to her clinic appointments back around the beginning of the year. I had tried on more than one occasion to get in touch with her but had not received any call back. Patient presented to the hospital with confusion. There appears to be some suggestion that she had run out of her psychiatric medicine which is probably correct. Also appears to of had a urinary tract infection. On interview today the patient's not able to give any lucid history. She is speaking in a pressured tone and is very disorganized. Constantly returns to the themes of torture and [place].  Social history: Patient is married. Good relationship with her husband who is very familiar with her mental health issues. She has 5 grown daughters most of whom she  is on good terms with.  Medical history: Patient has a history of hypertension coronary artery disease severe recurrent major  depression urinary tract infections  Substance abuse history: No history of drug or alcohol abuse  Past Psychiatric History: In the time I've known her the patient has had chronic problems with fragile mood and psychotic symptoms. For years she had responded very well to maintenance ECT treatment although even then she had a few episodes of decompensation. When she gets depressed she will start saying obsessively negative things about herself and become very involuted. Current presentation seems more manic-like which is not something I'm used to seeing in her as much. She did well with a combination of both Zyprexa and quetiapine as well as Remeron at the listed doses and tolerated that well. Her whatever reason she stopped coming to her outpatient appointments. I was more concerned by that really then by the stopping ECT. I had long known that she was ambivalent about her ECT treatments although it was clear that they were very helpful for her. Stopping her medicine is more worrisome. It's not known to me whether she has a history of suicidal behavior certainly not in the time and I've known her. Has not been aggressive or violent others.  Risk to Self: Is patient at risk for suicide?:  (pt unable to answer suicide risk assessment) Risk to Others:   Prior Inpatient Therapy:   Prior Outpatient Therapy:    Past Medical History:  Past Medical History  Diagnosis Date  . Hypertension   . Hyperlipemia 07/29/14  . Chronic low back pain 07/29/14  . Spinal stenosis 07/29/14  . GERD (gastroesophageal reflux disease)   . Depression   . Esophageal spasm 07/29/14  . Bilateral carpal tunnel syndrome 07/27/14  . Degenerative arthritis of lumbar spine 07/27/14    Past Surgical History  Procedure Laterality Date  . Hip surgery Right   . Replacement total knee bilateral Bilateral 07/27/14  . Inner ear surgery Right    Family History:  Family History  Problem Relation Age of Onset  . Family history unknown: Yes    Family Psychiatric  History: There is a family history of anxiety and depression. Social History:  History  Alcohol Use No     History  Drug Use No    Social History   Social History  . Marital Status: Married    Spouse Name: N/A  . Number of Children: N/A  . Years of Education: N/A   Social History Main Topics  . Smoking status: Former Smoker    Types: Cigarettes    Quit date: 07/29/1975  . Smokeless tobacco: Never Used  . Alcohol Use: No  . Drug Use: No  . Sexual Activity: No     Comment: postmenopause   Other Topics Concern  . None   Social History Narrative   Additional Social History:    Allergies:   Allergies  Allergen Reactions  . Asa [Aspirin] Other (See Comments)    Unknown reaction  . Sulfa Antibiotics Other (See Comments)    Unknown reaction    Labs:  No results found for this or any previous visit (from the past 48 hour(s)).  Current Facility-Administered Medications  Medication Dose Route Frequency Provider Last Rate Last Dose  . acetaminophen (TYLENOL) tablet 650 mg  650 mg Oral Q6H PRN Katharina Caper, MD       Or  . acetaminophen (TYLENOL) suppository 650 mg  650 mg Rectal Q6H PRN Katharina Caper, MD      .  acetaminophen-codeine (TYLENOL #3) 300-30 MG per tablet 1 tablet  1 tablet Oral Q8H PRN Katharina Caperima Vaickute, MD   1 tablet at 10/04/15 0812  . dextrose 5 %-0.45 % sodium chloride infusion   Intravenous Continuous Enid Baasadhika Kalisetti, MD 75 mL/hr at 10/07/15 1046    . docusate sodium (COLACE) capsule 100 mg  100 mg Oral BID Katharina Caperima Vaickute, MD   100 mg at 10/04/15 2113  . enoxaparin (LOVENOX) injection 40 mg  40 mg Subcutaneous Q24H Katharina Caperima Vaickute, MD   40 mg at 10/04/15 2114  . mirtazapine (REMERON) tablet 45 mg  45 mg Oral QHS Katharina Caperima Vaickute, MD   30 mg at 10/04/15 2113  . nystatin (MYCOSTATIN/NYSTOP) topical powder   Topical TID Eugenie Norrieana G Blakeney, NP      . OLANZapine (ZYPREXA) injection 15 mg  15 mg Intramuscular QHS Audery AmelJohn T Taleisha Kaczynski, MD      .  ondansetron (ZOFRAN) tablet 4 mg  4 mg Oral Q6H PRN Katharina Caperima Vaickute, MD       Or  . ondansetron (ZOFRAN) injection 4 mg  4 mg Intravenous Q6H PRN Katharina Caperima Vaickute, MD      . pantoprazole (PROTONIX) EC tablet 40 mg  40 mg Oral Daily Katharina Caperima Vaickute, MD   40 mg at 10/04/15 0820  . QUEtiapine (SEROQUEL) tablet 300 mg  300 mg Oral QHS Katharina Caperima Vaickute, MD   300 mg at 10/04/15 2113  . sertraline (ZOLOFT) tablet 200 mg  200 mg Oral Reita ChardBH-q7a Rima Vaickute, MD   200 mg at 10/04/15 0700  . simvastatin (ZOCOR) tablet 40 mg  40 mg Oral QHS Katharina Caperima Vaickute, MD   40 mg at 10/04/15 2113  . sodium chloride flush (NS) 0.9 % injection 3 mL  3 mL Intravenous Q12H Katharina Caperima Vaickute, MD   3 mL at 10/07/15 1000  . ziprasidone (GEODON) injection 10 mg  10 mg Intramuscular Q12H PRN Katharina Caperima Vaickute, MD   10 mg at 10/05/15 [id number]   Facility-Administered Medications Ordered in Other Encounters  Medication Dose Route Frequency Provider Last Rate Last Dose  . labetalol (NORMODYNE,TRANDATE) injection 20 mg  20 mg Intravenous Once Audery AmelJohn T Kyree Adriano, MD      . labetalol (NORMODYNE,TRANDATE) injection 20 mg  20 mg Intravenous Once Audery AmelJohn T Cord Wilczynski, MD      . lactated ringers infusion   Intravenous Continuous Audery AmelJohn T Jaelon Gatley, MD      . methohexital Sodium 60 mg  60 mg Intravenous Once Audery AmelJohn T Deniyah Dillavou, MD      . methohexital Sodium 60 mg  60 mg Intravenous Once Audery AmelJohn T Aamirah Salmi, MD      . succinylcholine (ANECTINE) injection 80 mg  80 mg Intravenous Once Audery AmelJohn T Kimmerly Lora, MD      . succinylcholine (ANECTINE) injection 80 mg  80 mg Intravenous Once Audery AmelJohn T Shawntee Mainwaring, MD        Musculoskeletal: Strength & Muscle Tone: decreased Gait & Station: unable to stand Patient leans: N/A  Psychiatric Specialty Exam: Physical Exam  Nursing note and vitals reviewed. Constitutional: She appears well-developed and well-nourished.  Patient had very dry lips. Looking sickly.  HENT:  Head: Normocephalic and atraumatic.  Eyes: Conjunctivae are normal. Pupils are equal,  round, and reactive to light.  Neck: Normal range of motion.  Cardiovascular: Regular rhythm and normal heart sounds.   Respiratory: Effort normal.  GI: Soft.  Musculoskeletal: Normal range of motion.  Neurological: She is alert. A cranial nerve deficit is present.  She has a right sided facial droop  that is chronic.  Skin: Skin is warm and dry.  Psychiatric: Her affect is labile and inappropriate. Her speech is rapid and/or pressured. She is agitated. Thought content is paranoid. She expresses impulsivity. She expresses no suicidal ideation. She exhibits abnormal recent memory. She is inattentive.    Review of Systems  Constitutional: Negative.   HENT: Negative.   Eyes: Negative.   Respiratory: Negative.   Cardiovascular: Negative.   Gastrointestinal: Negative.   Musculoskeletal: Negative.   Skin: Negative.   Neurological: Negative.   Psychiatric/Behavioral: Negative for depression, suicidal ideas, hallucinations, memory loss and substance abuse. The patient is nervous/anxious and has insomnia.     Blood pressure 136/70, pulse 87, temperature 98.4 F (36.9 C), temperature source Oral, resp. rate 18, height 5\' 2"  (1.575 m), weight 71.4 kg (157 lb 6.5 oz), SpO2 98 %.Body mass index is 28.78 kg/(m^2).  General Appearance: Disheveled  Eye Contact:  Fair  Speech:  Garbled and Pressured  Volume:  Increased  Mood:  Irritable  Affect:  Inappropriate and Labile  Thought Process:  Disorganized and Irrelevant  Orientation:  Negative  Thought Content:  Illogical, Paranoid Ideation, Rumination and Tangential  Suicidal Thoughts:  No  Homicidal Thoughts:  No  Memory:  Immediate;   Fair Recent;   Poor Remote;   Fair  Judgement:  Impaired  Insight:  Shallow  Psychomotor Activity:  Decreased  Concentration:  Concentration: Poor  Recall:  [employer] of Knowledge:  Fair  Language:  Fair  Akathisia:  No  Handed:  Right  AIMS (if indicated):     Assets:  Financial  Resources/Insurance Housing Resilience Social Support  ADL's:  Impaired  Cognition:  Impaired,  Mild  Sleep:        Treatment Plan Summary: Daily contact with patient to assess and evaluate symptoms and progress in treatment, Medication management and Plan As described previously patient will be given ECT treatment tomorrow morning. Orders are completed. She will be nothing by mouth after midnight tonight. Case reviewed with hospitalist. I'm also as mentioned above changing her Zyprexa to 15 mg IM at night rather than the 20 mg oral. The slight decrease in dose because of the perceived increase in sedative potency that may be associated with the IM form. No other change to treatment at this point. Daily follow-up. Clarified that her diagnosis as far as I'm concerned is no longer encephalopathy but recurrent psychotic depression  Disposition: Patient does not meet criteria for psychiatric inpatient admission. Supportive therapy provided about ongoing stressors.  Mordecai Rasmussen, MD 10/07/2015 7:25 PM

## 2015-10-07 NOTE — Progress Notes (Signed)
[facility] [place] [facility]   PATIENT NAME: Jenna BrackettJoan Sneeringer    MR#:  [id number]  DATE OF BIRTH:  [birth year]  SUBJECTIVE:  CHIEF COMPLAINT:   Chief Complaint  Patient presents with  . Altered Mental Status   - patient talking to herself, tried to interact, tangential thoughts - refusing oral medications  REVIEW OF SYSTEMS:  Review of Systems  Unable to perform ROS: mental acuity    DRUG ALLERGIES:   Allergies  Allergen Reactions  . Asa [Aspirin] Other (See Comments)    Unknown reaction  . Sulfa Antibiotics Other (See Comments)    Unknown reaction    VITALS:  Blood pressure 136/63, pulse 90, temperature 98 F (36.7 C), temperature source Oral, resp. rate 18, height 5\' 2"  (1.575 m), weight 71.4 kg (157 lb 6.5 oz), SpO2 97 %.  PHYSICAL EXAMINATION:  Physical Exam  GENERAL:  80 y.o.-year-old patient lying in the bed with no acute distress. Well developed, ill noruished EYES: Pupils equal, round, reactive to light and accommodation. No scleral icterus. Extraocular muscles intact.  HEENT: Head atraumatic, normocephalic. Oropharynx and nasopharynx clear.  NECK:  Supple, no jugular venous distention. No thyroid enlargement, no tenderness.  LUNGS: Normal breath sounds bilaterally, no wheezing, rales,rhonchi or crepitation. No use of accessory muscles of respiration.  CARDIOVASCULAR: S1, S2 normal. No murmurs, rubs, or gallops.  ABDOMEN: Soft, nontender, nondistended. Bowel sounds present. No organomegaly or mass.  EXTREMITIES: No pedal edema, cyanosis, or clubbing.  NEUROLOGIC:  Patient is alert, not cooperative for complete neuro exam. Continues to talk, no meaningful conversation, very tangential. Moving all extremities. No focal deficits identified.  PSYCHIATRIC: The patient is alert, very confused.Marland Kitchen.  SKIN: No obvious rash, lesion, or ulcer.    LABORATORY PANEL:   CBC  Recent Labs Lab 10/05/15 0502  WBC 7.0  HGB 10.8*  HCT 31.5*  PLT  160   ------------------------------------------------------------------------------------------------------------------  Chemistries   Recent Labs Lab 10/03/15 0937  10/05/15 0502  NA 149*  < > 139  K 3.8  < > 4.1  CL 117*  < > 110  CO2 19*  < > 24  GLUCOSE 99  < > 105*  BUN 34*  < > 15  CREATININE 1.01*  < > 0.57  CALCIUM 9.3  < > 8.4*  MG 2.1  --   --   AST 65*  --   --   ALT 20  --   --   ALKPHOS 80  --   --   BILITOT 1.0  --   --   < > = values in this interval not displayed. ------------------------------------------------------------------------------------------------------------------  Cardiac Enzymes  Recent Labs Lab 10/04/15 0125  TROPONINI 0.11*   ------------------------------------------------------------------------------------------------------------------  RADIOLOGY:  No results found.  EKG:   Orders placed or performed during the hospital encounter of 10/03/15  . EKG 12-Lead  . EKG 12-Lead  . EKG 12-Lead  . EKG 12-Lead    ASSESSMENT AND PLAN:   80 year old female with known history of hypertension, hyperlipidemia, chronic low back pain and major depressive disorder requiring ECT treatments in the past with psychotic behavior presents to the hospital secondary to encephalopathy and major depression.  #1 Escherichia coli UTI-blood cultures are negative. Urine cultures with Escherichia coli. -Received treatment with Rocephin. Finished IV antibiotics.  #2 acute renal failure-secondary to ATN from infection. Resolved with IV fluids. -Improved hypernatremia as well. Discontinue IV fluids once oral intake is improved.  #3 acute encephalopathy-secondary to major depression with  psychosis. -Appreciate psych consult. Off Precedex drip. Patient has been refusing to take her oral Zyprexa, Seroquel, Remeron and Zoloft. -Has responded to weekly ECT treatments for several years in the past. Recently stopped those and could have relapsed on her  condition - Discussed with psychiatric history, we'll change oral medications to injectable as she is refusing meds. -ECT tomorrow.  #4 DVT prophylaxis-on Lovenox.  Plan is to adjust psych medications, ECT and see if her mental status would be improved. She needs to be able to walk before she can be transferred to behavioral medicine unit. So continue to monitor her on medical floor at this time. -Physical therapy consulted, patient hasn't been able to work with them due to her mental condition.   All the records are reviewed and case discussed with Care Management/Social Workerr. Management plans discussed with the patient, family and they are in agreement.  CODE STATUS: Full Code  TOTAL TIME TAKING CARE OF THIS PATIENT: 37 minutes.   POSSIBLE D/C IN ?  DAYS, DEPENDING ON CLINICAL CONDITION.   Enid Baas M.D on 10/07/2015 at 12:52 PM  Between 7am to 6pm - Pager - [phone]  After 6pm go to [email or web address] - password [email or web address] [facility]  [facility]  Office  [phone]  CC: Primary care physician; ADEROJU, Marin Olp, MD

## 2015-10-08 ENCOUNTER — Inpatient Hospital Stay: Payer: Medicare Other | Admitting: Anesthesiology

## 2015-10-08 ENCOUNTER — Encounter: Payer: Self-pay | Admitting: *Deleted

## 2015-10-08 LAB — CULTURE, BLOOD (ROUTINE X 2)
CULTURE: NO GROWTH
Culture: NO GROWTH

## 2015-10-08 LAB — BASIC METABOLIC PANEL
ANION GAP: 10 (ref 5–15)
BUN: 8 mg/dL (ref 6–20)
CHLORIDE: 99 mmol/L — AB (ref 101–111)
CO2: 21 mmol/L — AB (ref 22–32)
Calcium: 8.6 mg/dL — ABNORMAL LOW (ref 8.9–10.3)
Creatinine, Ser: 0.71 mg/dL (ref 0.44–1.00)
GFR calc non Af Amer: >60 mL/min (ref >60)
Glucose, Bld: 122 mg/dL — ABNORMAL HIGH (ref 65–99)
POTASSIUM: 4 mmol/L (ref 3.5–5.1)
SODIUM: 130 mmol/L — AB (ref 135–145)

## 2015-10-08 LAB — GLUCOSE, CAPILLARY: GLUCOSE-CAPILLARY: 137 mg/dL — AB (ref 65–99)

## 2015-10-08 MED ORDER — KETOROLAC TROMETHAMINE 30 MG/ML IJ SOLN
30.0000 mg | Freq: Once | INTRAMUSCULAR | Status: AC
  Administered 2015-10-08: 30 mg via INTRAVENOUS

## 2015-10-08 MED ORDER — ONDANSETRON HCL 4 MG/2ML IJ SOLN: 4.0000 mg | Freq: Once | INTRAMUSCULAR | Status: DC | PRN

## 2015-10-08 MED ORDER — SUCCINYLCHOLINE CHLORIDE 200 MG/10ML IV SOSY
PREFILLED_SYRINGE | INTRAVENOUS | Status: DC | PRN
  Administered 2015-10-08: 80 mg via INTRAVENOUS

## 2015-10-08 MED ORDER — HALOPERIDOL LACTATE 5 MG/ML IJ SOLN
3.0000 mg | Freq: Once | INTRAMUSCULAR | Status: AC
  Administered 2015-10-08: 3 mg via INTRAVENOUS

## 2015-10-08 MED ORDER — SODIUM CHLORIDE 0.9 % IV SOLN
INTRAVENOUS | Status: DC
  Administered 2015-10-08 – 2015-10-11 (×5): via INTRAVENOUS

## 2015-10-08 MED ORDER — FENTANYL CITRATE (PF) 100 MCG/2ML IJ SOLN: 25.0000 ug | INTRAMUSCULAR | Status: DC | PRN

## 2015-10-08 MED ORDER — HALOPERIDOL LACTATE 5 MG/ML IJ SOLN
2.0000 mg | Freq: Once | INTRAMUSCULAR | Status: AC
Start: 2015-10-08 — End: 2015-10-08
  Administered 2015-10-08: 2 mg via INTRAVENOUS

## 2015-10-08 MED ORDER — LABETALOL HCL 5 MG/ML IV SOLN
INTRAVENOUS | Status: DC | PRN
  Administered 2015-10-08: 20 mg via INTRAVENOUS

## 2015-10-08 MED ORDER — KETOROLAC TROMETHAMINE 30 MG/ML IJ SOLN
INTRAMUSCULAR | Status: AC
  Filled 2015-10-08: qty 1

## 2015-10-08 MED ORDER — METHOHEXITAL SODIUM 100 MG/10ML IV SOSY
PREFILLED_SYRINGE | INTRAVENOUS | Status: DC | PRN
  Administered 2015-10-08: 60 mg via INTRAVENOUS

## 2015-10-08 MED ORDER — MIDAZOLAM HCL 2 MG/2ML IJ SOLN
INTRAMUSCULAR | Status: DC | PRN
  Administered 2015-10-08: .5 mg via INTRAVENOUS

## 2015-10-08 NOTE — Anesthesia Preprocedure Evaluation (Signed)
Anesthesia Evaluation  Patient identified by MRN, date of birth, ID band Patient awake    Reviewed: Allergy & Precautions, NPO status , Patient's Chart, lab work & pertinent test results, reviewed documented beta blocker date and time   Airway Mallampati: II  TM Distance: >3 FB     Dental  (+) Chipped   Pulmonary former smoker,           Cardiovascular hypertension, Pt. on medications      Neuro/Psych PSYCHIATRIC DISORDERS Depression  Neuromuscular disease    GI/Hepatic GERD  Controlled,  Endo/Other    Renal/GU Renal InsufficiencyRenal disease     Musculoskeletal  (+) Arthritis ,   Abdominal   Peds  Hematology   Anesthesia Other Findings   Reproductive/Obstetrics                             Anesthesia Physical Anesthesia Plan  ASA: III  Anesthesia Plan: General   Post-op Pain Management:    Induction: Intravenous  Airway Management Planned: Mask  Additional Equipment:   Intra-op Plan:   Post-operative Plan:   Informed Consent: I have reviewed the patients History and Physical, chart, labs and discussed the procedure including the risks, benefits and alternatives for the proposed anesthesia with the patient or authorized representative who has indicated his/her understanding and acceptance.     Plan Discussed with: CRNA  Anesthesia Plan Comments:         Anesthesia Quick Evaluation

## 2015-10-08 NOTE — H&P (Signed)
Jenna Dudley is an 80 y.o. female.   Chief Complaint: Patient unable to articulate a chief complaint. Highly agitated. Complains of constant pain that does not appear to be physiologic. 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 Had recently been noncompliant with ECT as well as briefly with medicine  Past Medical History  Diagnosis Date  . Hypertension   . Hyperlipemia 07/29/14  . Chronic low back pain 07/29/14  . Spinal stenosis 07/29/14  . GERD (gastroesophageal reflux disease)   . Depression   . Esophageal spasm 07/29/14  . Bilateral carpal tunnel syndrome 07/27/14  . Degenerative arthritis of lumbar spine 07/27/14    Past Surgical History  Procedure Laterality Date  . Hip surgery Right   . Replacement total knee bilateral Bilateral 07/27/14  . Inner ear surgery Right     Family History  Problem Relation Age of Onset  . Family history unknown: Yes   Social History:  reports that she quit smoking about 40 years ago. Her smoking use included Cigarettes. She has never used smokeless tobacco. She reports that she does not drink alcohol or use illicit drugs.  Allergies:  Allergies  Allergen Reactions  . Asa [Aspirin] Other (See Comments)    Unknown reaction  . Sulfa Antibiotics Other (See Comments)    Unknown reaction    Medications Prior to Admission  Medication Sig Dispense Refill  . acetaminophen-codeine (TYLENOL #3) 300-30 MG per tablet Take 1 tablet by mouth every 8 (eight) hours as needed for moderate pain or severe pain.    Marland Kitchen losartan (COZAAR) 25 MG tablet Take 1 tablet (25 mg total) by mouth daily. 30 tablet 0  . mirtazapine (REMERON) 45 MG tablet Take 1 tablet (45 mg total) by mouth at bedtime. 30 tablet 6  . OLANZapine (ZYPREXA) 20 MG tablet Take 1 tablet (20 mg total) by mouth at bedtime. 30 tablet 6  . omeprazole (PRILOSEC) 20 MG capsule Take 20 mg by mouth 2 (two) times daily before a meal.      . potassium chloride SA (K-DUR,KLOR-CON) 20 MEQ tablet Take 20 mEq by mouth daily.    . QUEtiapine (SEROQUEL) 300 MG tablet Take 1 tablet (300 mg total) by mouth at bedtime. 30 tablet 6  . sertraline (ZOLOFT) 100 MG tablet Take 2 tablets (200 mg total) by mouth daily. 2 tabs in the am (Patient taking differently: Take 200 mg by mouth every morning. ) 180 tablet 6  . simvastatin (ZOCOR) 40 MG tablet Take 40 mg by mouth at bedtime.     . hydrOXYzine (ATARAX/VISTARIL) 50 MG tablet Take 1 tablet (50 mg total) by mouth 3 (three) times daily as needed for anxiety. 30 tablet 0    Results for orders placed or performed during the hospital encounter of 10/03/15 (from the past 48 hour(s))  Glucose, capillary     Status: Abnormal   Collection Time: 10/07/15 10:56 PM  Result Value Ref Range   Glucose-Capillary 108 (H) 65 - 99 mg/dL  Basic metabolic panel     Status: Abnormal   Collection Time: 10/08/15  4:48 AM  Result Value Ref Range   Sodium 130 (L) 135 - 145 mmol/L   Potassium 4.0 3.5 - 5.1 mmol/L   Chloride 99 (L) 101 - 111 mmol/L   CO2 21 (L) 22 - 32 mmol/L   Glucose, Bld 122 (H) 65 - 99 mg/dL   BUN 8 6 - 20 mg/dL  Creatinine, Ser 0.71 0.44 - 1.00 mg/dL   Calcium 8.6 (L) 8.9 - 10.3 mg/dL   GFR calc non Af Amer >60 >60 mL/min   GFR calc Af Amer >60 >60 mL/min    Comment: (NOTE) The eGFR has been calculated using the CKD EPI equation. This calculation has not been validated in all clinical situations. eGFR's persistently <60 mL/min signify possible Chronic Kidney Disease.    Anion gap 10 5 - 15  Glucose, capillary     Status: Abnormal   Collection Time: 10/08/15  6:30 AM  Result Value Ref Range   Glucose-Capillary 137 (H) 65 - 99 mg/dL   No results found.  Review of Systems  Unable to perform ROS: psychiatric disorder    Blood pressure 142/55, pulse 103, temperature 98.1 F (36.7 C), temperature source Oral, resp. rate 18, height 5' 2" (1.575 m), weight 71.385 kg (157 lb 6 oz),  SpO2 95 %. Physical Exam  Nursing note and vitals reviewed. Constitutional: She appears well-developed and well-nourished.  HENT:  Head: Normocephalic and atraumatic.  Eyes: Conjunctivae are normal. Pupils are equal, round, and reactive to light.  Neck: Normal range of motion.  Cardiovascular: Normal heart sounds.   Respiratory: Effort normal.  GI: Soft.  Musculoskeletal: Normal range of motion.  Neurological: She is alert. A cranial nerve deficit is present.  Skin: Skin is warm and dry.  Psychiatric: Her mood appears anxious. Her affect is angry, labile and inappropriate. Her speech is rapid and/or pressured and slurred. She is agitated and combative. Thought content is paranoid and delusional. Cognition and memory are impaired. She expresses impulsivity. She exhibits a depressed mood. She is noncommunicative.     Assessment/Plan ECT treatment today. Patient has responded very well to treatment in the past. She is on the schedule to continue 3 times a week treatment into next week.  Alethia Berthold, MD 10/08/2015, 12:30 PM

## 2015-10-08 NOTE — Consult Note (Signed)
  ECT: Patient had ECT today right unilateral treatment. The treatment went well. She was very confused and disorganized prior to treatment and required some sedation but tolerated the actual treatment without difficulty. On reevaluation this afternoon she is very tired and lethargic as would be expected from the amount of medicine she got. Case reviewed with the patient's husband. No change to current medications. I will follow up over the weekend and we plan ECT into next week.

## 2015-10-08 NOTE — Progress Notes (Signed)
Pt has no diet order. MD called and ordered regular diet. Pt was previously on dysphagia 3 diet before being NPO. MD resumed regular diet but instructed staff to make sure pt was properly sat up and awake prior to p.o intake.

## 2015-10-08 NOTE — Progress Notes (Addendum)
[facility] [place] [facility]   PATIENT NAME: Jenna Dudley    MR#:  [id number]  DATE OF BIRTH:  1935/04/05  SUBJECTIVE:  CHIEF COMPLAINT:   Chief Complaint  Patient presents with  . Altered Mental Status   - seen after ECT, sleeping, but gets agitated easily when tried to touch, also fighting off the Aides when they were trying to change her.  REVIEW OF SYSTEMS:  Review of Systems  Unable to perform ROS: mental acuity    DRUG ALLERGIES:   Allergies  Allergen Reactions  . Asa [Aspirin] Other (See Comments)    Unknown reaction  . Sulfa Antibiotics Other (See Comments)    Unknown reaction    VITALS:  Blood pressure 134/62, pulse 71, temperature 97.5 F (36.4 C), temperature source Oral, resp. rate 27, height  (1.575 m), weight 71.385 kg (157 lb 6 oz), SpO2 93 %.  PHYSICAL EXAMINATION:  Physical Exam  GENERAL:  80 y.o.-year-old patient lying in the bed. Well developed Agitated and fighting- moving all four extremities EYES: Pupils equal, round, reactive to light and accommodation. No scleral icterus. Extraocular muscles intact.  HEENT: Head atraumatic, normocephalic. Oropharynx and nasopharynx clear.  NECK:  Supple, no jugular venous distention. No thyroid enlargement, no tenderness.  LUNGS: Normal breath sounds bilaterally, no wheezing, rales,rhonchi or crepitation. No use of accessory muscles of respiration.  CARDIOVASCULAR: S1, S2 normal. No murmurs, rubs, or gallops.  ABDOMEN: Soft, nontender, nondistended. Bowel sounds present. No organomegaly or mass.  EXTREMITIES: No pedal edema, cyanosis, or clubbing.  NEUROLOGIC:  Patient is alert, not cooperative for complete neuro exam.  - Just finished her ECT, Moving all extremities. No focal deficits identified.  PSYCHIATRIC: The patient is alert, confused.Agitated.  SKIN: No obvious rash, lesion, or ulcer.    LABORATORY PANEL:   CBC  Recent Labs Lab 10/05/15 0502  WBC 7.0  HGB  10.8*  HCT 31.5*  PLT 160   ------------------------------------------------------------------------------------------------------------------  Chemistries   Recent Labs Lab 10/03/15 0937  10/08/15 0448  NA 149*  < > 130*  K 3.8  < > 4.0  CL 117*  < > 99*  CO2 19*  < > 21*  GLUCOSE 99  < > 122*  BUN 34*  < > 8  CREATININE 1.01*  < > 0.71  CALCIUM 9.3  < > 8.6*  MG 2.1  --   --   AST 65*  --   --   ALT 20  --   --   ALKPHOS 80  --   --   BILITOT 1.0  --   --   < > = values in this interval not displayed. ------------------------------------------------------------------------------------------------------------------  Cardiac Enzymes  Recent Labs Lab 10/04/15 0125  TROPONINI 0.11*   ------------------------------------------------------------------------------------------------------------------  RADIOLOGY:  No results found.  EKG:   Orders placed or performed during the hospital encounter of 10/03/15  . EKG 12-Lead  . EKG 12-Lead  . EKG 12-Lead  . EKG 12-Lead    ASSESSMENT AND PLAN:   80 year old female with known history of hypertension, hyperlipidemia, chronic low back pain and major depressive disorder requiring ECT treatments in the past with psychotic behavior presents to the hospital secondary to encephalopathy and major depression.  #1 Escherichia coli UTI-blood cultures are negative. Urine cultures with Escherichia coli. -Received treatment with Rocephin. Finished IV antibiotics.  #2 acute renal failure-secondary to ATN from infection. Resolved with IV fluids. -continue fluids- changed to NS  #3 Encephalopathy-secondary to major depression  with psychosis. -Appreciate psych consult. Off Precedex drip. Patient has been refusing to take her oral Zyprexa, Seroquel, Remeron and Zoloft. - Changed to IM zyprexa -Has responded to weekly ECT treatments for several years in the past. Recently stopped those and could have relapsed on her condition -ECT  started today and 3 times next week.  #4 DVT prophylaxis-on Lovenox.  Plan is to adjust psych medications, - ECT and see if her mental status would be improved. She needs to be able to walk before she can be transferred to behavioral medicine unit. So continue to monitor her on medical floor at this time. -Physical therapy consulted, patient hasn't been able to work with them due to her mental condition.   All the records are reviewed and case discussed with Care Management/Social Workerr. Management plans discussed with the patient, family and they are in agreement.  CODE STATUS: Full Code  TOTAL TIME TAKING CARE OF THIS PATIENT: 33 minutes.   POSSIBLE D/C IN ?  DAYS, DEPENDING ON CLINICAL CONDITION.   Jenna Dudley,Jenna Dudley M.D on 10/08/2015 at 1:32 PM  Between 7am to 6pm - Pager - [phone]  After 6pm go to [email or web address] - password [email or web address] [facility]  [facility]  Office  [phone]  CC: Primary care physician; Jenna Dudley, Jenna OlpELIZABETH OYEYEMI, MD

## 2015-10-08 NOTE — Anesthesia Postprocedure Evaluation (Signed)
Anesthesia Post Note  Patient: Jenna Dudley  Procedure(s) Performed: * No procedures listed *  Patient location during evaluation: PACU Anesthesia Type: General Level of consciousness: awake and alert Pain management: pain level controlled Vital Signs Assessment: post-procedure vital signs reviewed and stable Respiratory status: spontaneous breathing, nonlabored ventilation, respiratory function stable and patient connected to nasal cannula oxygen Cardiovascular status: blood pressure returned to baseline and stable Postop Assessment: no signs of nausea or vomiting Anesthetic complications: no    Last Vitals:  Filed Vitals:   10/08/15 1259 10/08/15 1311  BP: 108/50 100/62  Pulse: 70 68  Temp:    Resp: 31 28    Last Pain:  Filed Vitals:   10/08/15 1312  PainSc: Asleep                 Emilyn Ruble S

## 2015-10-08 NOTE — Transfer of Care (Signed)
Immediate Anesthesia Transfer of Care Note  Patient: Jenna Dudley  Procedure(s) Performed: ECT  Patient Location: PACU  Anesthesia Type:General  Level of Consciousness: sedated  Airway & Oxygen Therapy: Patient Spontanous Breathing and Patient connected to face mask oxygen  Post-op Assessment: Report given to RN  Post vital signs: Reviewed and stable  Last Vitals:  Filed Vitals:   10/08/15 0944 10/08/15 1239  BP: 142/55 113/41  Pulse: 103 81  Temp: 36.7 C 36.4 C  Resp: 18 28    Last Pain:  Filed Vitals:   10/08/15 1242  PainSc: Asleep         Complications: No apparent anesthesia complications

## 2015-10-08 NOTE — Procedures (Signed)
ECT SERVICES Physician's Interval Evaluation & Treatment Note  Patient Identification: Jenna DaftJoan M Moorehouse MRN:  [id number] Date of Evaluation:  10/08/2015 TX #: 1. Number of system.  MADRS: Patient unable to cooperate with examination due to psychosis and agitation  MMSE: Unable to complete evaluation  P.E. Findings:  Patient's vital signs of been stable. Lungs are clear. Heart normal rate and rhythm.  Psychiatric Interval Note:  Patient is agitated extremely confused tangential flight of ideas somewhat combative at times. Poor insight. Constant negative and strange talk  Subjective:  [name] seen for evaluation for Electroconvulsive Therapy. Complains of being "tortured"  Treatment Summary:   [x]   Right Unilateral             []  Bilateral   % Energy : 0.3 ms 100%   Impedance: 1510 ohms  Seizure Energy Index: 3870 V squared  Postictal Suppression Index: 39%  Seizure Concordance Index: 97%  Medications  Pre Shock: Brevital 60 mg succinylcholine 80 mg labetalol 20 mg  Post Shock: Versed 2 mg  Seizure Duration: 38 seconds by EMG, 87 seconds by EEG   Comments: Follow-up Monday Wednesday and Friday into next week   Lungs:  [x]   Clear to auscultation               []  Other:   Heart:    [x]   Regular rhythm             []  irregular rhythm    [x]   Previous H&P reviewed, patient examined and there are NO CHANGES                 []   Previous H&P reviewed, patient examined and there are changes noted.   Mordecai RasmussenJohn Clapacs, MD 7/14/201712:32 PM

## 2015-10-08 NOTE — Anesthesia Procedure Notes (Signed)
Date/Time: 10/08/2015 12:26 PM Performed by: Lily KocherPERALTA, Rex Magee Pre-anesthesia Checklist: Patient identified, Emergency Drugs available, Suction available and Patient being monitored Patient Re-evaluated:Patient Re-evaluated prior to inductionOxygen Delivery Method: Circle system utilized Preoxygenation: Pre-oxygenation with 100% oxygen Intubation Type: IV induction Ventilation: Mask ventilation without difficulty and Mask ventilation throughout procedure Airway Equipment and Method: Bite block Placement Confirmation: positive ETCO2 Dental Injury: Teeth and Oropharynx as per pre-operative assessment

## 2015-10-09 LAB — BASIC METABOLIC PANEL
Anion gap: 9 (ref 5–15)
BUN: 14 mg/dL (ref 6–20)
CHLORIDE: 110 mmol/L (ref 101–111)
CO2: 19 mmol/L — ABNORMAL LOW (ref 22–32)
Calcium: 8.2 mg/dL — ABNORMAL LOW (ref 8.9–10.3)
Creatinine, Ser: 0.51 mg/dL (ref 0.44–1.00)
GFR calc non Af Amer: >60 mL/min (ref >60)
Glucose, Bld: 93 mg/dL (ref 65–99)
POTASSIUM: 4.2 mmol/L (ref 3.5–5.1)
SODIUM: 138 mmol/L (ref 135–145)

## 2015-10-09 NOTE — Consult Note (Signed)
[facility] Face-to-Face Psychiatry Consult   Reason for Consult:  Consult for this 80 year old woman with a history of recurrent psychotic depression. Currently in the hospital with confusion and delirium Referring Physician:  Manuella Ghazi Patient Identification: Jenna Dudley MRN:  [id number] Principal Diagnosis: Major depression recurrent severe with psychotic features Diagnosis:   Patient Active Problem List   Diagnosis Date Noted  . Acute encephalopathy [G93.40] 10/04/2015  . Sepsis ([place]) [A41.9] 10/03/2015  . Acute pyelonephritis [N10] 10/03/2015  . Hypernatremia [E87.0] 10/03/2015  . Lactic acidosis [E87.2] 10/03/2015  . Acute renal insufficiency [N28.9] 10/03/2015  . Leukocytosis [D72.829] 10/03/2015  . Agitation [R45.1] 10/03/2015  . Severe recurrent major depression without psychotic features ([place]) [F33.2]   . Major depressive disorder, recurrent episode, severe, with psychotic behavior ([place]) [F33.3] 10/13/2014  . Severe episode of recurrent major depressive disorder, with psychotic features ([place]) [F33.3] 10/13/2014  . Major depressive disorder, 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 features ([place]) [F33.2] 10/06/2014  . Infection of urinary tract [N39.0] 09/17/2014  . Essential hypertension [I10] 09/17/2014  . Chronic back pain [M54.9, G89.29] 09/17/2014  . Gastric reflux [K21.9] 09/17/2014  . History of stroke [Z86.73] 09/17/2014  . Hypertension [I10] 09/16/2014  . Severe recurrent major depression with psychotic features ([place]) [F33.3]   . 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 Hammer toe [M20.40] 09/12/2013  . Foot pain [M79.673] 09/12/2013  . Fungal infection of nail [B35.1] 09/12/2013  . Carpal tunnel syndrome [G56.00] 12/21/2010  . Chronic LBP [M54.5, G89.29] 12/21/2010  . Degenerative arthritis of lumbar spine [M47.816] 12/21/2010  . Clinical depression [F32.9] 12/21/2010  . 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 HLD (hyperlipidemia) [E78.5] 12/21/2010  . BP  (high blood pressure) [I10] 12/21/2010  . Acid reflux [K21.9] 12/21/2010  . Spinal stenosis [M48.00] 12/21/2010    Total Time spent with patient: 20 minutes  Subjective:   Jenna Dudley is a 80 y.o. female patient admitted with "I know you". Follow-up Saturday the 15th. Patient continues to be confused agitated and appear to be manic. She is not able to hold any kind of conversation. Talks incessantly and makes no sense. Hoping that continued ECT treatment and we can get her medicine into her she will start to look a little better. Still only intermittently compliant if that.  HPI:  Patient interviewed. Chart reviewed. Patient very well known to me from multiple prior encounters. She has been a maintenance ECT treatment patient for years and I also see her in my outpatient clinic. Patient dropped out of treatment with ECT and also stopped coming to her clinic appointments back around the beginning of the year. I had tried on more than one occasion to get in touch with her but had not received any call back. Patient presented to the hospital with confusion. There appears to be some suggestion that she had run out of her psychiatric medicine which is probably correct. Also appears to of had a urinary tract infection. On interview today the patient's not able to give any lucid history. She is speaking in a pressured tone and is very disorganized. Constantly returns to the themes of torture and [place].  Social history: Patient is married. Good relationship with her husband who is very familiar with her mental health issues. She has 5 grown daughters most of whom she is on good terms with.  Medical history: Patient has a history of hypertension coronary artery disease severe recurrent major depression urinary tract infections  Substance abuse history: No history of drug or  alcohol abuse  Past Psychiatric History: In the time I've known her the patient has had chronic problems with fragile mood  and psychotic symptoms. For years she had responded very well to maintenance ECT treatment although even then she had a few episodes of decompensation. When she gets depressed she will start saying obsessively negative things about herself and become very involuted. Current presentation seems more manic-like which is not something I'm used to seeing in her as much. She did well with a combination of both Zyprexa and quetiapine as well as Remeron at the listed doses and tolerated that well. Her whatever reason she stopped coming to her outpatient appointments. I was more concerned by that really then by the stopping ECT. I had long known that she was ambivalent about her ECT treatments although it was clear that they were very helpful for her. Stopping her medicine is more worrisome. It's not known to me whether she has a history of suicidal behavior certainly not in the time and I've known her. Has not been aggressive or violent others.  Risk to Self: Is patient at risk for suicide?:  (pt unable to answer suicide risk assessment) Risk to Others:   Prior Inpatient Therapy:   Prior Outpatient Therapy:    Past Medical History:  Past Medical History  Diagnosis Date  . Hypertension   . Hyperlipemia 07/29/14  . Chronic low back pain 07/29/14  . Spinal stenosis 07/29/14  . GERD (gastroesophageal reflux disease)   . Depression   . Esophageal spasm 07/29/14  . Bilateral carpal tunnel syndrome 07/27/14  . Degenerative arthritis of lumbar spine 07/27/14    Past Surgical History  Procedure Laterality Date  . Hip surgery Right   . Replacement total knee bilateral Bilateral 07/27/14  . Inner ear surgery Right    Family History:  Family History  Problem Relation Age of Onset  . Family history unknown: Yes   Family Psychiatric  History: There is a family history of anxiety and depression. Social History:  History  Alcohol Use No     History  Drug Use No    Social History   Social History  . Marital  Status: Married    Spouse Name: N/A  . Number of Children: N/A  . Years of Education: N/A   Social History Main Topics  . Smoking status: Former Smoker    Types: Cigarettes    Quit date: 07/29/1975  . Smokeless tobacco: Never Used  . Alcohol Use: No  . Drug Use: No  . Sexual Activity: No     Comment: postmenopause   Other Topics Concern  . None   Social History Narrative   Additional Social History:    Allergies:   Allergies  Allergen Reactions  . Asa [Aspirin] Other (See Comments)    Unknown reaction  . Sulfa Antibiotics Other (See Comments)    Unknown reaction    Labs:  Results for orders placed or performed during the hospital encounter of 10/03/15 (from the past 48 hour(s))  Glucose, capillary     Status: Abnormal   Collection Time: 10/07/15 10:56 PM  Result Value Ref Range   Glucose-Capillary 108 (H) 65 - 99 mg/dL  Basic metabolic panel     Status: Abnormal   Collection Time: 10/08/15  4:48 AM  Result Value Ref Range   Sodium 130 (L) 135 - 145 mmol/L   Potassium 4.0 3.5 - 5.1 mmol/L   Chloride 99 (L) 101 - 111 mmol/L   CO2 21 (  L) 22 - 32 mmol/L   Glucose, Bld 122 (H) 65 - 99 mg/dL   BUN 8 6 - 20 mg/dL   Creatinine, Ser 0.71 0.44 - 1.00 mg/dL   Calcium 8.6 (L) 8.9 - 10.3 mg/dL   GFR calc non Af Amer >60 >60 mL/min   GFR calc Af Amer >60 >60 mL/min    Comment: (NOTE) The eGFR has been calculated using the CKD EPI equation. This calculation has not been validated in all clinical situations. eGFR's persistently <60 mL/min signify possible Chronic Kidney Disease.    Anion gap 10 5 - 15  Glucose, capillary     Status: Abnormal   Collection Time: 10/08/15  6:30 AM  Result Value Ref Range   Glucose-Capillary 137 (H) 65 - 99 mg/dL  Basic metabolic panel     Status: Abnormal   Collection Time: 10/09/15  4:33 AM  Result Value Ref Range   Sodium 138 135 - 145 mmol/L   Potassium 4.2 3.5 - 5.1 mmol/L   Chloride 110 101 - 111 mmol/L   CO2 19 (L) 22 - 32  mmol/L   Glucose, Bld 93 65 - 99 mg/dL   BUN 14 6 - 20 mg/dL   Creatinine, Ser 0.51 0.44 - 1.00 mg/dL   Calcium 8.2 (L) 8.9 - 10.3 mg/dL   GFR calc non Af Amer >60 >60 mL/min   GFR calc Af Amer >60 >60 mL/min    Comment: (NOTE) The eGFR has been calculated using the CKD EPI equation. This calculation has not been validated in all clinical situations. eGFR's persistently <60 mL/min signify possible Chronic Kidney Disease.    Anion gap 9 5 - 15    Current Facility-Administered Medications  Medication Dose Route Frequency Provider Last Rate Last Dose  . 0.9 %  sodium chloride infusion   Intravenous Continuous Gladstone Lighter, MD 75 mL/hr at 10/09/15 1719    . acetaminophen (TYLENOL) tablet 650 mg  650 mg Oral Q6H PRN Theodoro Grist, MD       Or  . acetaminophen (TYLENOL) suppository 650 mg  650 mg Rectal Q6H PRN Theodoro Grist, MD   650 mg at 10/08/15 0312  . acetaminophen-codeine (TYLENOL #3) 300-30 MG per tablet 1 tablet  1 tablet Oral Q8H PRN Theodoro Grist, MD   1 tablet at 10/04/15 0812  . docusate sodium (COLACE) capsule 100 mg  100 mg Oral BID Theodoro Grist, MD   100 mg at 10/04/15 2113  . enoxaparin (LOVENOX) injection 40 mg  40 mg Subcutaneous Q24H Theodoro Grist, MD   40 mg at 10/07/15 2256  . fentaNYL (SUBLIMAZE) injection 25 mcg  25 mcg Intravenous Q5 min PRN Gunnar Bulla, MD      . mirtazapine (REMERON) tablet 45 mg  45 mg Oral QHS Theodoro Grist, MD   30 mg at 10/04/15 2113  . nystatin (MYCOSTATIN/NYSTOP) topical powder   Topical TID Awilda Bill, NP      . OLANZapine (ZYPREXA) injection 15 mg  15 mg Intramuscular QHS Gonzella Lex, MD   15 mg at 10/09/15 2230  . ondansetron (ZOFRAN) tablet 4 mg  4 mg Oral Q6H PRN Theodoro Grist, MD       Or  . ondansetron (ZOFRAN) injection 4 mg  4 mg Intravenous Q6H PRN Theodoro Grist, MD      . ondansetron (ZOFRAN) injection 4 mg  4 mg Intravenous Once PRN Gunnar Bulla, MD      . pantoprazole (PROTONIX) EC tablet 40 mg  40 mg Oral  Daily Theodoro Grist, MD   40 mg at 10/04/15 0820  . QUEtiapine (SEROQUEL) tablet 300 mg  300 mg Oral QHS Theodoro Grist, MD   300 mg at 10/09/15 2232  . sertraline (ZOLOFT) tablet 200 mg  200 mg Oral Hulen Skains, MD   200 mg at 10/04/15 0700  . simvastatin (ZOCOR) tablet 40 mg  40 mg Oral QHS Theodoro Grist, MD   40 mg at 10/04/15 2113  . sodium chloride flush (NS) 0.9 % injection 3 mL  3 mL Intravenous Q12H Theodoro Grist, MD   3 mL at 10/07/15 2310  . ziprasidone (GEODON) injection 10 mg  10 mg Intramuscular Q12H PRN Theodoro Grist, MD   10 mg at 10/09/15 0240   Facility-Administered Medications Ordered in Other Encounters  Medication Dose Route Frequency Provider Last Rate Last Dose  . labetalol (NORMODYNE,TRANDATE) injection 20 mg  20 mg Intravenous Once Gonzella Lex, MD      . labetalol (NORMODYNE,TRANDATE) injection 20 mg  20 mg Intravenous Once Gonzella Lex, MD      . lactated ringers infusion   Intravenous Continuous Gonzella Lex, MD      . methohexital Sodium 60 mg  60 mg Intravenous Once Gonzella Lex, MD      . methohexital Sodium 60 mg  60 mg Intravenous Once Gonzella Lex, MD      . succinylcholine (ANECTINE) injection 80 mg  80 mg Intravenous Once Gonzella Lex, MD      . succinylcholine (ANECTINE) injection 80 mg  80 mg Intravenous Once Gonzella Lex, MD        Musculoskeletal: Strength & Muscle Tone: decreased Gait & Station: unable to stand Patient leans: N/A  Psychiatric Specialty Exam: Physical Exam  Nursing note and vitals reviewed. Constitutional: She appears well-developed and well-nourished.  Patient had very dry lips. Looking sickly.  HENT:  Head: Normocephalic and atraumatic.  Eyes: Conjunctivae are normal. Pupils are equal, round, and reactive to light.  Neck: Normal range of motion.  Cardiovascular: Regular rhythm and normal heart sounds.   Respiratory: Effort normal.  GI: Soft.  Musculoskeletal: Normal range of motion.  Neurological: She  is alert. A cranial nerve deficit is present.  She has a right sided facial droop that is chronic.  Skin: Skin is warm and dry.  Psychiatric: Her affect is labile and inappropriate. Her speech is rapid and/or pressured. She is agitated. Thought content is paranoid. She expresses impulsivity. She expresses no suicidal ideation. She exhibits abnormal recent memory. She is inattentive.    Review of Systems  Constitutional: Negative.   HENT: Negative.   Eyes: Negative.   Respiratory: Negative.   Cardiovascular: Negative.   Gastrointestinal: Negative.   Musculoskeletal: Negative.   Skin: Negative.   Neurological: Negative.   Psychiatric/Behavioral: Negative for depression, suicidal ideas, hallucinations, memory loss and substance abuse. The patient is nervous/anxious and has insomnia.     Blood pressure 177/94, pulse 87, temperature 97.9 F (36.6 C), temperature source Oral, resp. rate 20, height '5\' 2"'  (1.575 m), weight 71.385 kg (157 lb 6 oz), SpO2 97 %.Body mass index is 28.78 kg/(m^2).  General Appearance: Disheveled  Eye Contact:  Fair  Speech:  Garbled and Pressured  Volume:  Increased  Mood:  Irritable  Affect:  Inappropriate and Labile  Thought Process:  Disorganized and Irrelevant  Orientation:  Negative  Thought Content:  Illogical, Paranoid Ideation, Rumination and Tangential  Suicidal Thoughts:  No  Homicidal Thoughts:  No  Memory:  Immediate;   Fair Recent;   Poor Remote;   Fair  Judgement:  Impaired  Insight:  Shallow  Psychomotor Activity:  Decreased  Concentration:  Concentration: Poor  Recall:  [employer] of Knowledge:  Fair  Language:  Fair  Akathisia:  No  Handed:  Right  AIMS (if indicated):     Assets:  Financial Resources/Insurance Housing Resilience Social Support  ADL's:  Impaired  Cognition:  Impaired,  Mild  Sleep:        Treatment Plan Summary: Daily contact with patient to assess and evaluate symptoms and progress in treatment, Medication  management and Plan Patient had one ECT treatment so far yesterday. Today she has continued to be agitated and confused. When I saw her this evening she was talking nonstop, not making any sense, cursing constantly, very agitated. Fortunately I was able to convince her to swallow the Seroquel pills. We will keep trying to get her medicine into her. Next treatment is Monday morning.  Disposition: Patient does not meet criteria for psychiatric inpatient admission. Supportive therapy provided about ongoing stressors.  Alethia Berthold, MD 10/09/2015 10:53 PM

## 2015-10-09 NOTE — Progress Notes (Signed)
[facility] [place] [facility]   PATIENT NAME: Jenna BrackettJoan Dudley    MR#:  [id number]  DATE OF BIRTH:  1936-01-17  SUBJECTIVE:  CHIEF COMPLAINT:   Chief Complaint  Patient presents with  . Altered Mental Status   - ECT yesterday, sleeping this morning, easily agitated and talking a lot hen, paranoid thoughts, disorganized thoughts - Not cooperative for exam  REVIEW OF SYSTEMS:  Review of Systems  Unable to perform ROS: mental acuity    DRUG ALLERGIES:   Allergies  Allergen Reactions  . Asa [Aspirin] Other (See Comments)    Unknown reaction  . Sulfa Antibiotics Other (See Comments)    Unknown reaction    VITALS:  Blood pressure 148/91, pulse 94, temperature 98.4 F (36.9 C), temperature source Oral, resp. rate 24, height 5\' 2"  (1.575 m), weight 71.385 kg (157 lb 6 oz), SpO2 100 %.  PHYSICAL EXAMINATION:  Physical Exam  GENERAL:  80 y.o.-year-old patient lying in the bed. Well developed Agitated- moving all four extremities EYES: Pupils equal, round, reactive to light and accommodation. No scleral icterus. Extraocular muscles intact.  HEENT: Head atraumatic, normocephalic. Oropharynx and nasopharynx clear.  NECK:  Supple, no jugular venous distention. No thyroid enlargement, no tenderness.  LUNGS: Normal breath sounds bilaterally, no wheezing, rales,rhonchi or crepitation. No use of accessory muscles of respiration.  CARDIOVASCULAR: S1, S2 normal. No murmurs, rubs, or gallops.  ABDOMEN: Soft, nontender, nondistended. Bowel sounds present. No organomegaly or mass.  EXTREMITIES: No pedal edema, cyanosis, or clubbing. Mittens to both hands for safety reasons. NEUROLOGIC:  Patient is alert, not cooperative for complete neuro exam.  - No focal deficits identified.  PSYCHIATRIC: The patient is alert, confused.Agitated.  SKIN: No obvious rash, lesion, or ulcer.    LABORATORY PANEL:   CBC  Recent Labs Lab 10/05/15 0502  WBC 7.0  HGB 10.8*   HCT 31.5*  PLT 160   ------------------------------------------------------------------------------------------------------------------  Chemistries   Recent Labs Lab 10/03/15 0937  10/09/15 0433  NA 149*  < > 138  K 3.8  < > 4.2  CL 117*  < > 110  CO2 19*  < > 19*  GLUCOSE 99  < > 93  BUN 34*  < > 14  CREATININE 1.01*  < > 0.51  CALCIUM 9.3  < > 8.2*  MG 2.1  --   --   AST 65*  --   --   ALT 20  --   --   ALKPHOS 80  --   --   BILITOT 1.0  --   --   < > = values in this interval not displayed. ------------------------------------------------------------------------------------------------------------------  Cardiac Enzymes  Recent Labs Lab 10/04/15 0125  TROPONINI 0.11*   ------------------------------------------------------------------------------------------------------------------  RADIOLOGY:  No results found.  EKG:   Orders placed or performed during the hospital encounter of 10/03/15  . EKG 12-Lead  . EKG 12-Lead  . EKG 12-Lead  . EKG 12-Lead    ASSESSMENT AND PLAN:   80 year old female with known history of hypertension, hyperlipidemia, chronic low back pain and major depressive disorder requiring ECT treatments in the past with psychotic behavior presents to the hospital secondary to encephalopathy and major depression.  #1 Escherichia coli UTI-blood cultures are negative. Urine cultures with Escherichia coli. -Received treatment with Rocephin. Finished IV antibiotics.  #2 acute renal failure-secondary to ATN from infection. Resolved with IV fluids. -continue fluids- changed to NS  #3 Encephalopathy-secondary to major depression with psychosis. -Appreciate psych consult. Off  Precedex drip. Patient has been refusing to take her oral Zyprexa, Seroquel, Remeron and Zoloft. - Changed to IM zyprexa -Has responded to weekly ECT treatments for several years in the past. Recently stopped those and could have relapsed on her condition -ECT started  10/08/15 and 3 times next week.  #4 DVT prophylaxis-on Lovenox.  Plan is to adjust psych medications, - ECT and see if her mental status would be improved. She needs to be able to walk before she can be transferred to behavioral medicine unit. So continue to monitor her on medical floor at this time. -Physical therapy consulted, patient hasn't been able to work with them due to her mental condition.   All the records are reviewed and case discussed with Care Management/Social Workerr. Management plans discussed with the patient, family and they are in agreement.  CODE STATUS: Full Code  TOTAL TIME TAKING CARE OF THIS PATIENT: 28 minutes.   POSSIBLE D/C IN ?  DAYS, DEPENDING ON CLINICAL CONDITION.   Enid Baas M.D on 10/09/2015 at 12:40 PM  Between 7am to 6pm - Pager - [phone]  After 6pm go to [email or web address] - password [email or web address] [facility]  [facility]  Office  [phone]  CC: Primary care physician; ADEROJU, Marin Olp, MD

## 2015-10-10 LAB — CREATININE, SERUM
CREATININE: 0.51 mg/dL (ref 0.44–1.00)
GFR calc Af Amer: >60 mL/min (ref >60)
GFR calc non Af Amer: >60 mL/min (ref >60)

## 2015-10-10 LAB — C DIFFICILE QUICK SCREEN W PCR REFLEX
C DIFFICILE (CDIFF) INTERP: NOT DETECTED
C DIFFICILE (CDIFF) TOXIN: NEGATIVE
C Diff antigen: NEGATIVE

## 2015-10-10 LAB — CBC
HCT: 31.9 % — ABNORMAL LOW (ref 35.0–47.0)
Hemoglobin: 11 g/dL — ABNORMAL LOW (ref 12.0–16.0)
MCH: 30.5 pg (ref 26.0–34.0)
MCHC: 34.5 g/dL (ref 32.0–36.0)
MCV: 88.3 fL (ref 80.0–100.0)
PLATELETS: 177 10*3/uL (ref 150–440)
RBC: 3.61 MIL/uL — ABNORMAL LOW (ref 3.80–5.20)
RDW: 14.2 % (ref 11.5–14.5)
WBC: 6.7 10*3/uL (ref 3.6–11.0)

## 2015-10-10 MED ORDER — OLANZAPINE 10 MG IM SOLR
20.0000 mg | Freq: Once | INTRAMUSCULAR | Status: AC
  Administered 2015-10-10: 20 mg via INTRAMUSCULAR
  Filled 2015-10-10: qty 20

## 2015-10-10 MED ORDER — SODIUM CHLORIDE 0.9 % IV SOLN
250.0000 mL | Freq: Once | INTRAVENOUS | Status: AC
  Administered 2015-10-13: 10:00:00 via INTRAVENOUS

## 2015-10-10 NOTE — Progress Notes (Signed)
Still sitting in the chair. Watching TV. Drinking orange juice /sodas and eating some cupcakes from home (favorite) Urinating adequately. Refused to have vital signs taken. WiIll try again later. Frequent mood changes noted. From being nice, calm to agitated and yelling.

## 2015-10-10 NOTE — Progress Notes (Signed)
Dr. Anne HahnWillis was notified of pt having her third loose/ watery stool in brown/yellow in appearance. Pt was placed on enteric precautions. Waiting stool sample to send to lab to verify Cdiff or not.  Karsten RoLauren E Hobbs

## 2015-10-10 NOTE — Progress Notes (Signed)
[facility] [place] [facility]   PATIENT NAME: Jenna BrackettJoan Dudley    MR#:  [id number]  DATE OF BIRTH:  [birth year]  SUBJECTIVE:  CHIEF COMPLAINT:   Chief Complaint  Patient presents with  . Altered Mental Status   - sitting in the chair today, less talkative, but gets irritated easily, IV infiltrated, will not allow us to put another one in. - Leave it out for now - PO food encouraged, only drinking sodas for now - Likely next ECT tomorrow  REVIEW OF SYSTEMS:  Review of Systems  Unable to perform ROS: mental acuity    DRUG ALLERGIES:   Allergies  Allergen Reactions  . Asa [Aspirin] Other (See Comments)    Unknown reaction  . Sulfa Antibiotics Other (See Comments)    Unknown reaction    VITALS:  Blood pressure 163/70, pulse 103, temperature 98.3 F (36.8 C), temperature source Oral, resp. rate 20, height 5\' 2"  (1.575 m), weight 71.385 kg (157 lb 6 oz), SpO2 100 %.  PHYSICAL EXAMINATION:  Physical Exam  GENERAL:  80 y.o.-year-old patient lying in the bed. Well developed Less talkative and less agitated today EYES: Pupils equal, round, reactive to light and accommodation. No scleral icterus. Extraocular muscles intact.  HEENT: Head atraumatic, normocephalic. Oropharynx and nasopharynx clear.  NECK:  Supple, no jugular venous distention. No thyroid enlargement, no tenderness.  LUNGS: Normal breath sounds bilaterally, no wheezing, rales,rhonchi or crepitation. No use of accessory muscles of respiration.  CARDIOVASCULAR: S1, S2 normal. No murmurs, rubs, or gallops.  ABDOMEN: Soft, nontender, nondistended. Bowel sounds present. No organomegaly or mass.  EXTREMITIES: No pedal edema, cyanosis, or clubbing. Mittens to both hands for safety reasons. NEUROLOGIC:  Patient is alert, not cooperative for complete neuro exam.  - No focal deficits identified.  PSYCHIATRIC: The patient is alert, confused.Marland Kitchen.  SKIN: No obvious rash, lesion, or ulcer.     LABORATORY PANEL:   CBC  Recent Labs Lab 10/10/15 0650  WBC 6.7  HGB 11.0*  HCT 31.9*  PLT 177   ------------------------------------------------------------------------------------------------------------------  Chemistries   Recent Labs Lab 10/09/15 0433 10/10/15 0650  NA 138  --   K 4.2  --   CL 110  --   CO2 19*  --   GLUCOSE 93  --   BUN 14  --   CREATININE 0.51 0.51  CALCIUM 8.2*  --    ------------------------------------------------------------------------------------------------------------------  Cardiac Enzymes  Recent Labs Lab 10/04/15 0125  TROPONINI 0.11*   ------------------------------------------------------------------------------------------------------------------  RADIOLOGY:  No results found.  EKG:   Orders placed or performed during the hospital encounter of 10/03/15  . EKG 12-Lead  . EKG 12-Lead  . EKG 12-Lead  . EKG 12-Lead    ASSESSMENT AND PLAN:   80 year old female with known history of hypertension, hyperlipidemia, chronic low back pain and major depressive disorder requiring ECT treatments in the past with psychotic behavior presents to the hospital secondary to encephalopathy and major depression.  #1 Escherichia coli UTI-blood cultures are negative. Urine cultures with Escherichia coli. -Received treatment with Rocephin. Finished IV antibiotics.  #2 acute renal failure-secondary to ATN from infection. Resolved with IV fluids. -continue fluids- changed to NS  #3 Encephalopathy-secondary to major depression with psychosis. -Appreciate psych consult. Off Precedex drip.  -Patient has been refusing to take her oral  Seroquel, Remeron and Zoloft. - Changed to IM zyprexa -Has responded to weekly ECT treatments for several years in the past. Recently stopped those and could have relapsed on her  condition -ECT started 10/08/15 and 3 times next week.  #4 DVT prophylaxis-on Lovenox.  Can keep IV out today, encourage   Oral intake. IV can be re-attempted tomorrow prior to ECT.   All the records are reviewed and case discussed with Care Management/Social Workerr. Management plans discussed with the patient, family and they are in agreement.  CODE STATUS: Full Code  TOTAL TIME TAKING CARE OF THIS PATIENT: 28 minutes.   POSSIBLE D/C IN ?  DAYS, DEPENDING ON CLINICAL CONDITION.   Enid Baas M.D on 10/10/2015 at 12:31 PM  Between 7am to 6pm - Pager - [phone]  After 6pm go to [email or web address] - password [email or web address] [facility]  [facility]  Office  [phone]  CC: Primary care physician; ADEROJU, Marin Olp, MD

## 2015-10-10 NOTE — Progress Notes (Signed)
Patient got back in bed with stand by assist. Did  not want us to touch her and wanted to do it by herself. Drinking juice right now.

## 2015-10-10 NOTE — Progress Notes (Signed)
Still have no IV access. Per MD encourage PO intake .

## 2015-10-10 NOTE — Progress Notes (Addendum)
Back sitting up in the chair. Patient " back hurts from lying". Eating supper right now. I attempted several times to check her vital signs but patient is very firm  and refused to have vital signs taken . Patient" it hurts my arm". We will attempt again later.

## 2015-10-10 NOTE — Consult Note (Signed)
[facility] Face-to-Face Psychiatry Consult   Reason for Consult:  Consult for this 80 year old woman with a history of recurrent psychotic depression. Currently in the hospital with confusion and delirium Referring Physician:  Manuella Ghazi Patient Identification: Jenna Dudley MRN:  [id number] Principal Diagnosis: Major depression recurrent severe with psychotic features Diagnosis:   Patient Active Problem List   Diagnosis Date Noted  . Acute encephalopathy [G93.40] 10/04/2015  . Sepsis ([place]) [A41.9] 10/03/2015  . Acute pyelonephritis [N10] 10/03/2015  . Hypernatremia [E87.0] 10/03/2015  . Lactic acidosis [E87.2] 10/03/2015  . Acute renal insufficiency [N28.9] 10/03/2015  . Leukocytosis [D72.829] 10/03/2015  . Agitation [R45.1] 10/03/2015  . Severe recurrent major depression without psychotic features ([place]) [F33.2]   . Major depressive disorder, recurrent episode, severe, with psychotic behavior ([place]) [F33.3] 10/13/2014  . Severe episode of recurrent major depressive disorder, with psychotic features ([place]) [F33.3] 10/13/2014  . Major depressive disorder, 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 features ([place]) [F33.2] 10/06/2014  . Infection of urinary tract [N39.0] 09/17/2014  . Essential hypertension [I10] 09/17/2014  . Chronic back pain [M54.9, G89.29] 09/17/2014  . Gastric reflux [K21.9] 09/17/2014  . History of stroke [Z86.73] 09/17/2014  . Hypertension [I10] 09/16/2014  . Severe recurrent major depression with psychotic features ([place]) [F33.3]   . 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 Hammer toe [M20.40] 09/12/2013  . Foot pain [M79.673] 09/12/2013  . Fungal infection of nail [B35.1] 09/12/2013  . Carpal tunnel syndrome [G56.00] 12/21/2010  . Chronic LBP [M54.5, G89.29] 12/21/2010  . Degenerative arthritis of lumbar spine [M47.816] 12/21/2010  . Clinical depression [F32.9] 12/21/2010  . 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 HLD (hyperlipidemia) [E78.5] 12/21/2010  . BP  (high blood pressure) [I10] 12/21/2010  . Acid reflux [K21.9] 12/21/2010  . Spinal stenosis [M48.00] 12/21/2010    Total Time spent with patient: 20 minutes  Subjective:   Jenna Dudley is a 79 y.o. female patient admitted with "I know you". Follow-up Sunday the 16th. Patient seen. Chart reviewed. Case reviewed with nursing. Patient remains confused and disorganized. Bizarre hostile statements. On the other hand I was able to interrupt her at times and have a little bit of slightly organized conversation. She was able to make a couple of rational requests of me.  HPI:  Patient interviewed. Chart reviewed. Patient very well known to me from multiple prior encounters. She has been a maintenance ECT treatment patient for years and I also see her in my outpatient clinic. Patient dropped out of treatment with ECT and also stopped coming to her clinic appointments back around the beginning of the year. I had tried on more than one occasion to get in touch with her but had not received any call back. Patient presented to the hospital with confusion. There appears to be some suggestion that she had run out of her psychiatric medicine which is probably correct. Also appears to of had a urinary tract infection. On interview today the patient's not able to give any lucid history. She is speaking in a pressured tone and is very disorganized. Constantly returns to the themes of torture and [place].  Social history: Patient is married. Good relationship with her husband who is very familiar with her mental health issues. She has 5 grown daughters most of whom she is on good terms with.  Medical history: Patient has a history of hypertension coronary artery disease severe recurrent major depression urinary tract infections  Substance abuse history: No history of drug or alcohol abuse  Past Psychiatric  History: In the time I've known her the patient has had chronic problems with fragile mood and psychotic  symptoms. For years she had responded very well to maintenance ECT treatment although even then she had a few episodes of decompensation. When she gets depressed she will start saying obsessively negative things about herself and become very involuted. Current presentation seems more manic-like which is not something I'm used to seeing in her as much. She did well with a combination of both Zyprexa and quetiapine as well as Remeron at the listed doses and tolerated that well. Her whatever reason she stopped coming to her outpatient appointments. I was more concerned by that really then by the stopping ECT. I had long known that she was ambivalent about her ECT treatments although it was clear that they were very helpful for her. Stopping her medicine is more worrisome. It's not known to me whether she has a history of suicidal behavior certainly not in the time and I've known her. Has not been aggressive or violent others.  Risk to Self: Is patient at risk for suicide?:  (pt unable to answer suicide risk assessment) Risk to Others:   Prior Inpatient Therapy:   Prior Outpatient Therapy:    Past Medical History:  Past Medical History  Diagnosis Date  . Hypertension   . Hyperlipemia 07/29/14  . Chronic low back pain 07/29/14  . Spinal stenosis 07/29/14  . GERD (gastroesophageal reflux disease)   . Depression   . Esophageal spasm 07/29/14  . Bilateral carpal tunnel syndrome 07/27/14  . Degenerative arthritis of lumbar spine 07/27/14    Past Surgical History  Procedure Laterality Date  . Hip surgery Right   . Replacement total knee bilateral Bilateral 07/27/14  . Inner ear surgery Right    Family History:  Family History  Problem Relation Age of Onset  . Family history unknown: Yes   Family Psychiatric  History: There is a family history of anxiety and depression. Social History:  History  Alcohol Use No     History  Drug Use No    Social History   Social History  . Marital Status: Married     Spouse Name: N/A  . Number of Children: N/A  . Years of Education: N/A   Social History Main Topics  . Smoking status: Former Smoker    Types: Cigarettes    Quit date: 07/29/1975  . Smokeless tobacco: Never Used  . Alcohol Use: No  . Drug Use: No  . Sexual Activity: No     Comment: postmenopause   Other Topics Concern  . None   Social History Narrative   Additional Social History:    Allergies:   Allergies  Allergen Reactions  . Asa [Aspirin] Other (See Comments)    Unknown reaction  . Sulfa Antibiotics Other (See Comments)    Unknown reaction    Labs:  Results for orders placed or performed during the hospital encounter of 10/03/15 (from the past 48 hour(s))  Basic metabolic panel     Status: Abnormal   Collection Time: 10/09/15  4:33 AM  Result Value Ref Range   Sodium 138 135 - 145 mmol/L   Potassium 4.2 3.5 - 5.1 mmol/L   Chloride 110 101 - 111 mmol/L   CO2 19 (L) 22 - 32 mmol/L   Glucose, Bld 93 65 - 99 mg/dL   BUN 14 6 - 20 mg/dL   Creatinine, Ser 0.51 0.44 - 1.00 mg/dL   Calcium 8.2 (L) 8.9 -  10.3 mg/dL   GFR calc non Af Amer >60 >60 mL/min   GFR calc Af Amer >60 >60 mL/min    Comment: (NOTE) The eGFR has been calculated using the CKD EPI equation. This calculation has not been validated in all clinical situations. eGFR's persistently <60 mL/min signify possible Chronic Kidney Disease.    Anion gap 9 5 - 15  Creatinine, serum     Status: None   Collection Time: 10/10/15  6:50 AM  Result Value Ref Range   Creatinine, Ser 0.51 0.44 - 1.00 mg/dL   GFR calc non Af Amer >60 >60 mL/min   GFR calc Af Amer >60 >60 mL/min    Comment: (NOTE) The eGFR has been calculated using the CKD EPI equation. This calculation has not been validated in all clinical situations. eGFR's persistently <60 mL/min signify possible Chronic Kidney Disease.   CBC     Status: Abnormal   Collection Time: 10/10/15  6:50 AM  Result Value Ref Range   WBC 6.7 3.6 - 11.0 K/uL    RBC 3.61 (L) 3.80 - 5.20 MIL/uL   Hemoglobin 11.0 (L) 12.0 - 16.0 g/dL   HCT 31.9 (L) 35.0 - 47.0 %   MCV 88.3 80.0 - 100.0 fL   MCH 30.5 26.0 - 34.0 pg   MCHC 34.5 32.0 - 36.0 g/dL   RDW 14.2 11.5 - 14.5 %   Platelets 177 150 - 440 K/uL  C difficile quick scan w PCR reflex     Status: None   Collection Time: 10/10/15 12:11 PM  Result Value Ref Range   C Diff antigen NEGATIVE NEGATIVE   C Diff toxin NEGATIVE NEGATIVE   C Diff interpretation No C. difficile detected.     Current Facility-Administered Medications  Medication Dose Route Frequency Provider Last Rate Last Dose  . 0.9 %  sodium chloride infusion   Intravenous Continuous Gladstone Lighter, MD 75 mL/hr at 10/10/15 0626    . acetaminophen (TYLENOL) tablet 650 mg  650 mg Oral Q6H PRN Theodoro Grist, MD       Or  . acetaminophen (TYLENOL) suppository 650 mg  650 mg Rectal Q6H PRN Theodoro Grist, MD   650 mg at 10/08/15 0312  . acetaminophen-codeine (TYLENOL #3) 300-30 MG per tablet 1 tablet  1 tablet Oral Q8H PRN Theodoro Grist, MD   1 tablet at 10/04/15 0812  . docusate sodium (COLACE) capsule 100 mg  100 mg Oral BID Theodoro Grist, MD   100 mg at 10/04/15 2113  . enoxaparin (LOVENOX) injection 40 mg  40 mg Subcutaneous Q24H Theodoro Grist, MD   40 mg at 10/07/15 2256  . fentaNYL (SUBLIMAZE) injection 25 mcg  25 mcg Intravenous Q5 min PRN Gunnar Bulla, MD      . mirtazapine (REMERON) tablet 45 mg  45 mg Oral QHS Theodoro Grist, MD   30 mg at 10/04/15 2113  . nystatin (MYCOSTATIN/NYSTOP) topical powder   Topical TID Awilda Bill, NP      . OLANZapine (ZYPREXA) injection 15 mg  15 mg Intramuscular QHS Gonzella Lex, MD   15 mg at 10/09/15 2230  . ondansetron (ZOFRAN) tablet 4 mg  4 mg Oral Q6H PRN Theodoro Grist, MD       Or  . ondansetron (ZOFRAN) injection 4 mg  4 mg Intravenous Q6H PRN Theodoro Grist, MD      . ondansetron (ZOFRAN) injection 4 mg  4 mg Intravenous Once PRN Gunnar Bulla, MD      .  pantoprazole (PROTONIX) EC  tablet 40 mg  40 mg Oral Daily Theodoro Grist, MD   40 mg at 10/04/15 0820  . QUEtiapine (SEROQUEL) tablet 300 mg  300 mg Oral QHS Theodoro Grist, MD   300 mg at 10/09/15 2232  . sertraline (ZOLOFT) tablet 200 mg  200 mg Oral Hulen Skains, MD   200 mg at 10/10/15 0626  . simvastatin (ZOCOR) tablet 40 mg  40 mg Oral QHS Theodoro Grist, MD   40 mg at 10/04/15 2113  . sodium chloride flush (NS) 0.9 % injection 3 mL  3 mL Intravenous Q12H Theodoro Grist, MD   3 mL at 10/07/15 2310  . ziprasidone (GEODON) injection 10 mg  10 mg Intramuscular Q12H PRN Theodoro Grist, MD   10 mg at 10/09/15 0240   Facility-Administered Medications Ordered in Other Encounters  Medication Dose Route Frequency Provider Last Rate Last Dose  . labetalol (NORMODYNE,TRANDATE) injection 20 mg  20 mg Intravenous Once Gonzella Lex, MD      . labetalol (NORMODYNE,TRANDATE) injection 20 mg  20 mg Intravenous Once Gonzella Lex, MD      . lactated ringers infusion   Intravenous Continuous Gonzella Lex, MD      . methohexital Sodium 60 mg  60 mg Intravenous Once Gonzella Lex, MD      . methohexital Sodium 60 mg  60 mg Intravenous Once Gonzella Lex, MD      . succinylcholine (ANECTINE) injection 80 mg  80 mg Intravenous Once Gonzella Lex, MD      . succinylcholine (ANECTINE) injection 80 mg  80 mg Intravenous Once Gonzella Lex, MD        Musculoskeletal: Strength & Muscle Tone: decreased Gait & Station: unable to stand Patient leans: N/A  Psychiatric Specialty Exam: Physical Exam  Nursing note and vitals reviewed. Constitutional: She appears well-developed and well-nourished.  Patient had very dry lips. Looking sickly.  HENT:  Head: Normocephalic and atraumatic.  Eyes: Conjunctivae are normal. Pupils are equal, round, and reactive to light.  Neck: Normal range of motion.  Cardiovascular: Regular rhythm and normal heart sounds.   Respiratory: Effort normal.  GI: Soft.  Musculoskeletal: Normal range of  motion.  Neurological: She is alert. A cranial nerve deficit is present.  She has a right sided facial droop that is chronic.  Skin: Skin is warm and dry.  Psychiatric: Her affect is labile and inappropriate. Her speech is rapid and/or pressured. She is agitated. Thought content is paranoid. She expresses impulsivity. She expresses no suicidal ideation. She exhibits abnormal recent memory. She is inattentive.    Review of Systems  Constitutional: Negative.   HENT: Negative.   Eyes: Negative.   Respiratory: Negative.   Cardiovascular: Negative.   Gastrointestinal: Negative.   Musculoskeletal: Negative.   Skin: Negative.   Neurological: Negative.   Psychiatric/Behavioral: Negative for depression, suicidal ideas, hallucinations, memory loss and substance abuse. The patient is nervous/anxious and has insomnia.     Blood pressure 163/70, pulse 103, temperature 98.3 F (36.8 C), temperature source Oral, resp. rate 20, height _0  (1.575 m), weight 71.385 kg (157 lb 6 oz), SpO2 100 %.Body mass index is 28.78 kg/(m^2).  General Appearance: Disheveled  Eye Contact:  Fair  Speech:  Garbled and Pressured  Volume:  Increased  Mood:  Irritable  Affect:  Inappropriate and Labile  Thought Process:  Disorganized and Irrelevant  Orientation:  Negative  Thought Content:  Illogical, Paranoid Ideation, Rumination and  Tangential  Suicidal Thoughts:  No  Homicidal Thoughts:  No  Memory:  Immediate;   Fair Recent;   Poor Remote;   Fair  Judgement:  Impaired  Insight:  Shallow  Psychomotor Activity:  Decreased  Concentration:  Concentration: Poor  Recall:  [employer] of Knowledge:  Fair  Language:  Fair  Akathisia:  No  Handed:  Right  AIMS (if indicated):     Assets:  Financial Resources/Insurance Housing Resilience Social Support  ADL's:  Impaired  Cognition:  Impaired,  Mild  Sleep:        Treatment Plan Summary: Daily contact with patient to assess and evaluate symptoms and  progress in treatment, Medication management and Plan Patient remains confused agitated manic-like hostile at times. I think she actually however looks a little bit better today. Parts of her conversation actually made sense and I was able to interrupt her a little bit better. She was able to get up out of bed and sit in a chair. They've taken the mitts off her and she is not doing anything dangerous with her hands right now. Patient requested specifically of me that I give her "a shot". We will give her 20 mg of Zyprexa IM. ECT scheduled again for tomorrow.  Disposition: Patient does not meet criteria for psychiatric inpatient admission. Supportive therapy provided about ongoing stressors.  Alethia Berthold, MD 10/10/2015 5:21 PM

## 2015-10-10 NOTE — Progress Notes (Signed)
I encouraged the patient to walk in the hallway but patient refused. Patient is setting up in the chair right now. She transferred to the chair with stand by assist only. She did not want staff to touch her while transferring to the chair and she refused to use a walker as well. Still very confused and disoriented. IV access non-functional. Patient refused to get IV access re-started. Patient" I will punch you if you don't leave me alone." Will attempt to walk again later. Encourage PO intake since patient has no IV fluids running at this time due to no IV access. Prime Doc aware of the situation.

## 2015-10-11 ENCOUNTER — Inpatient Hospital Stay: Payer: Medicare Other

## 2015-10-11 ENCOUNTER — Inpatient Hospital Stay: Payer: Medicare Other | Admitting: Anesthesiology

## 2015-10-11 ENCOUNTER — Encounter: Payer: Self-pay | Admitting: Anesthesiology

## 2015-10-11 LAB — BASIC METABOLIC PANEL
ANION GAP: 7 (ref 5–15)
BUN: 7 mg/dL (ref 6–20)
CALCIUM: 8.4 mg/dL — AB (ref 8.9–10.3)
CO2: 24 mmol/L (ref 22–32)
Chloride: 109 mmol/L (ref 101–111)
Creatinine, Ser: 0.67 mg/dL (ref 0.44–1.00)
Glucose, Bld: 127 mg/dL — ABNORMAL HIGH (ref 65–99)
POTASSIUM: 3 mmol/L — AB (ref 3.5–5.1)
Sodium: 140 mmol/L (ref 135–145)

## 2015-10-11 MED ORDER — POTASSIUM CHLORIDE 20 MEQ PO PACK
40.0000 meq | PACK | Freq: Once | ORAL | Status: AC
  Administered 2015-10-11: 40 meq via ORAL
  Filled 2015-10-11: qty 2

## 2015-10-11 MED ORDER — ZIPRASIDONE MESYLATE 20 MG IM SOLR
20.0000 mg | INTRAMUSCULAR | Status: AC
  Administered 2015-10-11: 20 mg via INTRAMUSCULAR
  Filled 2015-10-11: qty 20

## 2015-10-11 MED ORDER — POTASSIUM CHLORIDE IN NACL 20-0.9 MEQ/L-% IV SOLN
INTRAVENOUS | Status: DC
  Administered 2015-10-11 – 2015-10-12 (×2): via INTRAVENOUS
  Filled 2015-10-11 (×6): qty 1000

## 2015-10-11 MED ORDER — LABETALOL HCL 5 MG/ML IV SOLN
INTRAVENOUS | Status: DC | PRN
  Administered 2015-10-11: 20 mg via INTRAVENOUS

## 2015-10-11 MED ORDER — HALOPERIDOL LACTATE 5 MG/ML IJ SOLN: 5.0000 mg | INTRAMUSCULAR | Status: DC | PRN

## 2015-10-11 MED ORDER — MIDAZOLAM HCL 2 MG/2ML IJ SOLN
2.0000 mg | Freq: Once | INTRAMUSCULAR | Status: AC
  Administered 2015-10-11: 2 mg via INTRAMUSCULAR
  Filled 2015-10-11: qty 2

## 2015-10-11 MED ORDER — HALOPERIDOL LACTATE 5 MG/ML IJ SOLN
5.0000 mg | Freq: Once | INTRAMUSCULAR | Status: AC
  Administered 2015-10-11: 5 mg via INTRAMUSCULAR

## 2015-10-11 MED ORDER — KETOROLAC TROMETHAMINE 30 MG/ML IJ SOLN
INTRAMUSCULAR | Status: DC | PRN
  Administered 2015-10-11: 30 mg via INTRAVENOUS

## 2015-10-11 MED ORDER — METHOHEXITAL SODIUM 100 MG/10ML IV SOSY
PREFILLED_SYRINGE | INTRAVENOUS | Status: DC | PRN
  Administered 2015-10-11: 60 mg via INTRAVENOUS

## 2015-10-11 MED ORDER — SUCCINYLCHOLINE CHLORIDE 200 MG/10ML IV SOSY
PREFILLED_SYRINGE | INTRAVENOUS | Status: DC | PRN
  Administered 2015-10-11: 80 mg via INTRAVENOUS

## 2015-10-11 NOTE — H&P (Signed)
Jenna Dudley is an 80 y.o. female.   Chief Complaint: Patient continues to be very agitated confused hospital aggressive. Complains of pain all over. HPI: Patient with severe recurrent depression possible bipolar disorder currently in a manic symptomatic phase and very agitated  Past Medical History  Diagnosis Date  . Hypertension   . Hyperlipemia 07/29/14  . Chronic low back pain 07/29/14  . Spinal stenosis 07/29/14  . GERD (gastroesophageal reflux disease)   . Depression   . Esophageal spasm 07/29/14  . Bilateral carpal tunnel syndrome 07/27/14  . Degenerative arthritis of lumbar spine 07/27/14    Past Surgical History  Procedure Laterality Date  . Hip surgery Right   . Replacement total knee bilateral Bilateral 07/27/14  . Inner ear surgery Right     Family History  Problem Relation Age of Onset  . Family history unknown: Yes   Social History:  reports that she quit smoking about 40 years ago. Her smoking use included Cigarettes. She has never used smokeless tobacco. She reports that she does not drink alcohol or use illicit drugs.  Allergies:  Allergies  Allergen Reactions  . Asa [Aspirin] Other (See Comments)    Unknown reaction  . Sulfa Antibiotics Other (See Comments)    Unknown reaction    Medications Prior to Admission  Medication Sig Dispense Refill  . acetaminophen-codeine (TYLENOL #3) 300-30 MG per tablet Take 1 tablet by mouth every 8 (eight) hours as needed for moderate pain or severe pain.    Marland Kitchen losartan (COZAAR) 25 MG tablet Take 1 tablet (25 mg total) by mouth daily. 30 tablet 0  . mirtazapine (REMERON) 45 MG tablet Take 1 tablet (45 mg total) by mouth at bedtime. 30 tablet 6  . OLANZapine (ZYPREXA) 20 MG tablet Take 1 tablet (20 mg total) by mouth at bedtime. 30 tablet 6  . omeprazole (PRILOSEC) 20 MG capsule Take 20 mg by mouth 2 (two) times daily before a meal.     . potassium chloride SA (K-DUR,KLOR-CON) 20 MEQ tablet Take 20 mEq by mouth daily.    . QUEtiapine  (SEROQUEL) 300 MG tablet Take 1 tablet (300 mg total) by mouth at bedtime. 30 tablet 6  . sertraline (ZOLOFT) 100 MG tablet Take 2 tablets (200 mg total) by mouth daily. 2 tabs in the am (Patient taking differently: Take 200 mg by mouth every morning. ) 180 tablet 6  . simvastatin (ZOCOR) 40 MG tablet Take 40 mg by mouth at bedtime.     . hydrOXYzine (ATARAX/VISTARIL) 50 MG tablet Take 1 tablet (50 mg total) by mouth 3 (three) times daily as needed for anxiety. 30 tablet 0    Results for orders placed or performed during the hospital encounter of 10/03/15 (from the past 48 hour(s))  Creatinine, serum     Status: None   Collection Time: 10/10/15  6:50 AM  Result Value Ref Range   Creatinine, Ser 0.51 0.44 - 1.00 mg/dL   GFR calc non Af Amer >60 >60 mL/min   GFR calc Af Amer >60 >60 mL/min    Comment: (NOTE) The eGFR has been calculated using the CKD EPI equation. This calculation has not been validated in all clinical situations. eGFR's persistently <60 mL/min signify possible Chronic Kidney Disease.   CBC     Status: Abnormal   Collection Time: 10/10/15  6:50 AM  Result Value Ref Range   WBC 6.7 3.6 - 11.0 K/uL   RBC 3.61 (L) 3.80 - 5.20 MIL/uL  Hemoglobin 11.0 (L) 12.0 - 16.0 g/dL   HCT 31.9 (L) 35.0 - 47.0 %   MCV 88.3 80.0 - 100.0 fL   MCH 30.5 26.0 - 34.0 pg   MCHC 34.5 32.0 - 36.0 g/dL   RDW 14.2 11.5 - 14.5 %   Platelets 177 150 - 440 K/uL  C difficile quick scan w PCR reflex     Status: None   Collection Time: 10/10/15 12:11 PM  Result Value Ref Range   C Diff antigen NEGATIVE NEGATIVE   C Diff toxin NEGATIVE NEGATIVE   C Diff interpretation No C. difficile detected.    No results found.  Review of Systems  Constitutional: Negative.   HENT: Negative.   Eyes: Negative.   Respiratory: Negative.   Cardiovascular: Negative.   Gastrointestinal: Negative.   Musculoskeletal: Negative.   Skin: Negative.   Neurological: Negative.   Psychiatric/Behavioral: Positive for  memory loss. Negative for depression, suicidal ideas, hallucinations and substance abuse. The patient is nervous/anxious and has insomnia.     Blood pressure 152/82, pulse 92, temperature 98.1 F (36.7 C), temperature source Oral, resp. rate 20, height _0  (1.575 m), weight 71.385 kg (157 lb 6 oz), SpO2 99 %. Physical Exam  Nursing note and vitals reviewed. Constitutional: She appears well-developed and well-nourished.  HENT:  Head: Normocephalic and atraumatic.  Eyes: Conjunctivae are normal. Pupils are equal, round, and reactive to light.  Neck: Normal range of motion.  Cardiovascular: Normal heart sounds.   Respiratory: Effort normal.  GI: Soft.  Musculoskeletal: Normal range of motion.  Neurological: She is alert.  Skin: Skin is warm and dry.  Psychiatric: Her affect is labile and inappropriate. Her speech is rapid and/or pressured and tangential. She is agitated, aggressive, hyperactive and combative. Thought content is paranoid. Cognition and memory are impaired. She expresses impulsivity.     Assessment/Plan Treatment today attempt continue treatment as long as were seeing some improvement  Alethia Berthold, MD 10/11/2015, 10:55 AM

## 2015-10-11 NOTE — Progress Notes (Addendum)
Pt returned from ECT procedure. Pt is resting in bed. VSS. Bed alarm is on and door has been left open. Was told by Elita QuickPam, RN that pt had a complete bed bath while in PACU.

## 2015-10-11 NOTE — Consult Note (Signed)
[facility] Face-to-Face Psychiatry Consult   Reason for Consult:  Consult for this 80 year old woman with a history of recurrent psychotic depression. Currently in the hospital with confusion and delirium Referring Physician:  Manuella Ghazi Patient Identification: Jenna Dudley MRN:  [id number] Principal Diagnosis: Major depression recurrent severe with psychotic features Diagnosis:   Patient Active Problem List   Diagnosis Date Noted  . Acute encephalopathy [G93.40] 10/04/2015  . Sepsis ([place]) [A41.9] 10/03/2015  . Acute pyelonephritis [N10] 10/03/2015  . Hypernatremia [E87.0] 10/03/2015  . Lactic acidosis [E87.2] 10/03/2015  . Acute renal insufficiency [N28.9] 10/03/2015  . Leukocytosis [D72.829] 10/03/2015  . Agitation [R45.1] 10/03/2015  . Severe recurrent major depression without psychotic features ([place]) [F33.2]   . Major depressive disorder, recurrent episode, severe, with psychotic behavior ([place]) [F33.3] 10/13/2014  . Severe episode of recurrent major depressive disorder, with psychotic features ([place]) [F33.3] 10/13/2014  . Major depressive disorder, 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 features ([place]) [F33.2] 10/06/2014  . Infection of urinary tract [N39.0] 09/17/2014  . Essential hypertension [I10] 09/17/2014  . Chronic back pain [M54.9, G89.29] 09/17/2014  . Gastric reflux [K21.9] 09/17/2014  . History of stroke [Z86.73] 09/17/2014  . Hypertension [I10] 09/16/2014  . Severe recurrent major depression with psychotic features ([place]) [F33.3]   . 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 Hammer toe [M20.40] 09/12/2013  . Foot pain [M79.673] 09/12/2013  . Fungal infection of nail [B35.1] 09/12/2013  . Carpal tunnel syndrome [G56.00] 12/21/2010  . Chronic LBP [M54.5, G89.29] 12/21/2010  . Degenerative arthritis of lumbar spine [M47.816] 12/21/2010  . Clinical depression [F32.9] 12/21/2010  . 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 HLD (hyperlipidemia) [E78.5] 12/21/2010  . BP  (high blood pressure) [I10] 12/21/2010  . Acid reflux [K21.9] 12/21/2010  . Spinal stenosis [M48.00] 12/21/2010    Total Time spent with patient: 20 minutes  Subjective:   Jenna Dudley is a 81 y.o. female patient admitted with "I know you". Follow-up Monday the 17th. Patient continues to be agitated this afternoon. Cursing at me. Very disorganized. Mood is labile. Unable to answer even basic questions about her physical health without going off on tangents full of curse words and bizarre thinking.  HPI:  Patient interviewed. Chart reviewed. Patient very well known to me from multiple prior encounters. She has been a maintenance ECT treatment patient for years and I also see her in my outpatient clinic. Patient dropped out of treatment with ECT and also stopped coming to her clinic appointments back around the beginning of the year. I had tried on more than one occasion to get in touch with her but had not received any call back. Patient presented to the hospital with confusion. There appears to be some suggestion that she had run out of her psychiatric medicine which is probably correct. Also appears to of had a urinary tract infection. On interview today the patient's not able to give any lucid history. She is speaking in a pressured tone and is very disorganized. Constantly returns to the themes of torture and [place].  Social history: Patient is married. Good relationship with her husband who is very familiar with her mental health issues. She has 5 grown daughters most of whom she is on good terms with.  Medical history: Patient has a history of hypertension coronary artery disease severe recurrent major depression urinary tract infections  Substance abuse history: No history of drug or alcohol abuse  Past Psychiatric History: In the time I've known her the patient has had chronic  problems with fragile mood and psychotic symptoms. For years she had responded very well to maintenance ECT  treatment although even then she had a few episodes of decompensation. When she gets depressed she will start saying obsessively negative things about herself and become very involuted. Current presentation seems more manic-like which is not something I'm used to seeing in her as much. She did well with a combination of both Zyprexa and quetiapine as well as Remeron at the listed doses and tolerated that well. Her whatever reason she stopped coming to her outpatient appointments. I was more concerned by that really then by the stopping ECT. I had long known that she was ambivalent about her ECT treatments although it was clear that they were very helpful for her. Stopping her medicine is more worrisome. It's not known to me whether she has a history of suicidal behavior certainly not in the time and I've known her. Has not been aggressive or violent others.  Risk to Self: Is patient at risk for suicide?: No Risk to Others:   Prior Inpatient Therapy:   Prior Outpatient Therapy:    Past Medical History:  Past Medical History  Diagnosis Date  . Hypertension   . Hyperlipemia 07/29/14  . Chronic low back pain 07/29/14  . Spinal stenosis 07/29/14  . GERD (gastroesophageal reflux disease)   . Depression   . Esophageal spasm 07/29/14  . Bilateral carpal tunnel syndrome 07/27/14  . Degenerative arthritis of lumbar spine 07/27/14    Past Surgical History  Procedure Laterality Date  . Hip surgery Right   . Replacement total knee bilateral Bilateral 07/27/14  . Inner ear surgery Right    Family History:  Family History  Problem Relation Age of Onset  . Family history unknown: Yes   Family Psychiatric  History: There is a family history of anxiety and depression. Social History:  History  Alcohol Use No     History  Drug Use No    Social History   Social History  . Marital Status: Married    Spouse Name: N/A  . Number of Children: N/A  . Years of Education: N/A   Social History Main Topics  .  Smoking status: Former Smoker    Types: Cigarettes    Quit date: 07/29/1975  . Smokeless tobacco: Never Used  . Alcohol Use: No  . Drug Use: No  . Sexual Activity: No     Comment: postmenopause   Other Topics Concern  . None   Social History Narrative   Additional Social History:    Allergies:   Allergies  Allergen Reactions  . Asa [Aspirin] Other (See Comments)    Unknown reaction  . Sulfa Antibiotics Other (See Comments)    Unknown reaction    Labs:  Results for orders placed or performed during the hospital encounter of 10/03/15 (from the past 48 hour(s))  Creatinine, serum     Status: None   Collection Time: 10/10/15  6:50 AM  Result Value Ref Range   Creatinine, Ser 0.51 0.44 - 1.00 mg/dL   GFR calc non Af Amer >60 >60 mL/min   GFR calc Af Amer >60 >60 mL/min    Comment: (NOTE) The eGFR has been calculated using the CKD EPI equation. This calculation has not been validated in all clinical situations. eGFR's persistently <60 mL/min signify possible Chronic Kidney Disease.   CBC     Status: Abnormal   Collection Time: 10/10/15  6:50 AM  Result Value Ref Range  WBC 6.7 3.6 - 11.0 K/uL   RBC 3.61 (L) 3.80 - 5.20 MIL/uL   Hemoglobin 11.0 (L) 12.0 - 16.0 g/dL   HCT 31.9 (L) 35.0 - 47.0 %   MCV 88.3 80.0 - 100.0 fL   MCH 30.5 26.0 - 34.0 pg   MCHC 34.5 32.0 - 36.0 g/dL   RDW 14.2 11.5 - 14.5 %   Platelets 177 150 - 440 K/uL  C difficile quick scan w PCR reflex     Status: None   Collection Time: 10/10/15 12:11 PM  Result Value Ref Range   C Diff antigen NEGATIVE NEGATIVE   C Diff toxin NEGATIVE NEGATIVE   C Diff interpretation No C. difficile detected.   Basic metabolic panel     Status: Abnormal   Collection Time: 10/11/15 11:44 AM  Result Value Ref Range   Sodium 140 135 - 145 mmol/L   Potassium 3.0 (L) 3.5 - 5.1 mmol/L   Chloride 109 101 - 111 mmol/L   CO2 24 22 - 32 mmol/L   Glucose, Bld 127 (H) 65 - 99 mg/dL   BUN 7 6 - 20 mg/dL   Creatinine, Ser  0.67 0.44 - 1.00 mg/dL   Calcium 8.4 (L) 8.9 - 10.3 mg/dL   GFR calc non Af Amer >60 >60 mL/min   GFR calc Af Amer >60 >60 mL/min    Comment: (NOTE) The eGFR has been calculated using the CKD EPI equation. This calculation has not been validated in all clinical situations. eGFR's persistently <60 mL/min signify possible Chronic Kidney Disease.    Anion gap 7 5 - 15    Current Facility-Administered Medications  Medication Dose Route Frequency Provider Last Rate Last Dose  . 0.9 %  sodium chloride infusion  250 mL Intravenous Once Gonzella Lex, MD      . 0.9 % NaCl with KCl 20 mEq/ L  infusion   Intravenous Continuous Gladstone Lighter, MD 75 mL/hr at 10/11/15 1423    . acetaminophen (TYLENOL) tablet 650 mg  650 mg Oral Q6H PRN Theodoro Grist, MD       Or  . acetaminophen (TYLENOL) suppository 650 mg  650 mg Rectal Q6H PRN Theodoro Grist, MD   650 mg at 10/08/15 0312  . acetaminophen-codeine (TYLENOL #3) 300-30 MG per tablet 1 tablet  1 tablet Oral Q8H PRN Theodoro Grist, MD   1 tablet at 10/04/15 0812  . docusate sodium (COLACE) capsule 100 mg  100 mg Oral BID Theodoro Grist, MD   100 mg at 10/04/15 2113  . enoxaparin (LOVENOX) injection 40 mg  40 mg Subcutaneous Q24H Theodoro Grist, MD   40 mg at 10/07/15 2256  . fentaNYL (SUBLIMAZE) injection 25 mcg  25 mcg Intravenous Q5 min PRN Gunnar Bulla, MD      . haloperidol lactate (HALDOL) injection 5 mg  5 mg Intravenous Q2H PRN Gonzella Lex, MD      . mirtazapine (REMERON) tablet 45 mg  45 mg Oral QHS Theodoro Grist, MD   45 mg at 10/10/15 2046  . nystatin (MYCOSTATIN/NYSTOP) topical powder   Topical TID Awilda Bill, NP      . OLANZapine (ZYPREXA) injection 15 mg  15 mg Intramuscular QHS Gonzella Lex, MD   15 mg at 10/10/15 2047  . ondansetron (ZOFRAN) tablet 4 mg  4 mg Oral Q6H PRN Theodoro Grist, MD       Or  . ondansetron (ZOFRAN) injection 4 mg  4 mg Intravenous Q6H PRN  Theodoro Grist, MD      . ondansetron [facility]) injection 4 mg  4  mg Intravenous Once PRN Gunnar Bulla, MD      . pantoprazole (PROTONIX) EC tablet 40 mg  40 mg Oral Daily Theodoro Grist, MD   40 mg at 10/11/15 0820  . QUEtiapine (SEROQUEL) tablet 300 mg  300 mg Oral QHS Theodoro Grist, MD   300 mg at 10/10/15 2045  . sertraline (ZOLOFT) tablet 200 mg  200 mg Oral Hulen Skains, MD   200 mg at 10/10/15 0626  . simvastatin (ZOCOR) tablet 40 mg  40 mg Oral QHS Theodoro Grist, MD   40 mg at 10/10/15 2046  . sodium chloride flush (NS) 0.9 % injection 3 mL  3 mL Intravenous Q12H Theodoro Grist, MD   3 mL at 10/10/15 2200  . ziprasidone (GEODON) injection 10 mg  10 mg Intramuscular Q12H PRN Theodoro Grist, MD   10 mg at 10/09/15 0240   Facility-Administered Medications Ordered in Other Encounters  Medication Dose Route Frequency Provider Last Rate Last Dose  . labetalol (NORMODYNE,TRANDATE) injection 20 mg  20 mg Intravenous Once Gonzella Lex, MD      . labetalol (NORMODYNE,TRANDATE) injection 20 mg  20 mg Intravenous Once Gonzella Lex, MD      . lactated ringers infusion   Intravenous Continuous Gonzella Lex, MD      . methohexital Sodium 60 mg  60 mg Intravenous Once Gonzella Lex, MD      . methohexital Sodium 60 mg  60 mg Intravenous Once Gonzella Lex, MD      . succinylcholine (ANECTINE) injection 80 mg  80 mg Intravenous Once Gonzella Lex, MD      . succinylcholine (ANECTINE) injection 80 mg  80 mg Intravenous Once Gonzella Lex, MD        Musculoskeletal: Strength & Muscle Tone: decreased Gait & Station: unable to stand Patient leans: N/A  Psychiatric Specialty Exam: Physical Exam  Nursing note and vitals reviewed. Constitutional: She appears well-developed and well-nourished.  Patient had very dry lips. Looking sickly.  HENT:  Head: Normocephalic and atraumatic.  Eyes: Conjunctivae are normal. Pupils are equal, round, and reactive to light.  Neck: Normal range of motion.  Cardiovascular: Regular rhythm and normal heart sounds.    Respiratory: Effort normal.  GI: Soft.  Musculoskeletal: Normal range of motion.  Neurological: She is alert. A cranial nerve deficit is present.  She has a right sided facial droop that is chronic.  Skin: Skin is warm and dry.  Psychiatric: Her affect is labile and inappropriate. Her speech is rapid and/or pressured. She is agitated. Thought content is paranoid. She expresses impulsivity. She expresses no suicidal ideation. She exhibits abnormal recent memory. She is inattentive.    Review of Systems  Constitutional: Negative.   HENT: Negative.   Eyes: Negative.   Respiratory: Negative.   Cardiovascular: Negative.   Gastrointestinal: Negative.   Musculoskeletal: Negative.   Skin: Negative.   Neurological: Negative.   Psychiatric/Behavioral: Negative for depression, suicidal ideas, hallucinations, memory loss and substance abuse. The patient is nervous/anxious and has insomnia.     Blood pressure 145/36, pulse 69, temperature 98.3 F (36.8 C), temperature source Axillary, resp. rate 20, height '5\' 2"'  (1.575 m), weight 71.385 kg (157 lb 6 oz), SpO2 95 %.Body mass index is 28.78 kg/(m^2).  General Appearance: Disheveled  Eye Contact:  Fair  Speech:  Garbled and Pressured  Volume:  Increased  Mood:  Irritable  Affect:  Inappropriate and Labile  Thought Process:  Disorganized and Irrelevant  Orientation:  Negative  Thought Content:  Illogical, Paranoid Ideation, Rumination and Tangential  Suicidal Thoughts:  No  Homicidal Thoughts:  No  Memory:  Immediate;   Fair Recent;   Poor Remote;   Fair  Judgement:  Impaired  Insight:  Shallow  Psychomotor Activity:  Decreased  Concentration:  Concentration: Poor  Recall:  [employer] of Knowledge:  Fair  Language:  Fair  Akathisia:  No  Handed:  Right  AIMS (if indicated):     Assets:  Financial Resources/Insurance Housing Resilience Social Support  ADL's:  Impaired  Cognition:  Impaired,  Mild  Sleep:        Treatment Plan  Summary: Daily contact with patient to assess and evaluate symptoms and progress in treatment, Medication management and Plan Patient with chronic recurrent psychotic depression now looking more like she is having bipolar symptoms. Patients in the hospital with medical complaints but also has been showing much worse psychotic behavior. After a great deal of effort this morning we were able to perform ECT treatment. Once we've had her sedated treatment itself went fine. On evaluation this afternoon she is still psychotic irritable angry inappropriate unable to interact in a normal way. I would like to continue ECT treatment as well as her usual antipsychotic medication which should be able to pull her out of this although if she continues to be as resistant as she was this morning is going to get increasingly difficult. If it looks like we are not able to meet her needs fully here in our hospital we would have to look into possible referral to geriatric psychiatry. I would prefer not to do that as she is a long-term patient of myself and our clinic. Appreciate continued management currently on the medicine service.  Disposition: Patient does not meet criteria for psychiatric inpatient admission. Supportive therapy provided about ongoing stressors.  Alethia Berthold, MD 10/11/2015 5:51 PM

## 2015-10-11 NOTE — Anesthesia Postprocedure Evaluation (Signed)
Anesthesia Post Note  Patient: Jenna DaftJoan M Riese  Procedure(s) Performed: * No procedures listed *  Patient location during evaluation: PACU Anesthesia Type: General Level of consciousness: awake and alert Pain management: pain level controlled Vital Signs Assessment: post-procedure vital signs reviewed and stable Respiratory status: spontaneous breathing, nonlabored ventilation, respiratory function stable and patient connected to nasal cannula oxygen Cardiovascular status: blood pressure returned to baseline and stable Postop Assessment: no signs of nausea or vomiting Anesthetic complications: no    Last Vitals:  Filed Vitals:   10/11/15 1208 10/11/15 1209  BP: 145/36   Pulse: 69   Temp:  36.8 C  Resp: 20     Last Pain:  Filed Vitals:   10/11/15 1217  PainSc: Asleep                 Lenard SimmerAndrew Klohe [place]

## 2015-10-11 NOTE — Care Management Important Message (Signed)
Important Message  Patient Details  Name: Jenna Dudley MRN: [id number] Date of Birth: [birth year]   Medicare Important Message Given:  Yes    Chapman FitchBOWEN, Danni Shima T, RN 10/11/2015, 1:47 PM

## 2015-10-11 NOTE — Progress Notes (Signed)
Pt was able to drink OJ and eat a pecan pinwheel brought to her from home. Pt is able to communicate appropriately and make formed sentences.

## 2015-10-11 NOTE — Anesthesia Preprocedure Evaluation (Signed)
Anesthesia Evaluation  Patient identified by MRN, date of birth, ID band Patient awake    Reviewed: Allergy & Precautions, NPO status , Patient's Chart, lab work & pertinent test results, reviewed documented beta blocker date and time   History of Anesthesia Complications Negative for: history of anesthetic complications  Airway Mallampati: II  TM Distance: >3 FB     Dental  (+) Chipped   Pulmonary former smoker,           Cardiovascular hypertension, Pt. on medications      Neuro/Psych PSYCHIATRIC DISORDERS  Neuromuscular disease    GI/Hepatic GERD  Controlled,  Endo/Other    Renal/GU Renal InsufficiencyRenal disease     Musculoskeletal  (+) Arthritis ,   Abdominal   Peds  Hematology   Anesthesia Other Findings Past Medical History:   Hypertension                                                 Hyperlipemia                                    07/29/14       Chronic low back pain                           07/29/14       Spinal stenosis                                 07/29/14       GERD (gastroesophageal reflux disease)                       Depression                                                   Esophageal spasm                                07/29/14       Bilateral carpal tunnel syndrome                07/27/14       Degenerative arthritis of lumbar spine          07/27/14      Patient is oriented x1, and is very combative  Reproductive/Obstetrics negative OB ROS                             Anesthesia Physical  Anesthesia Plan  ASA: III  Anesthesia Plan: General   Post-op Pain Management:    Induction: Intravenous  Airway Management Planned: Mask  Additional Equipment:   Intra-op Plan:   Post-operative Plan:   Informed Consent: I have reviewed the patients History and Physical, chart, labs and discussed the procedure including the risks, benefits and alternatives for the  proposed anesthesia with the patient or authorized representative who has indicated his/her understanding and acceptance.  Plan Discussed with: CRNA, Anesthesiologist and Surgeon  Anesthesia Plan Comments:         Anesthesia Quick Evaluation

## 2015-10-11 NOTE — Progress Notes (Signed)
Patient has arrived to the ECT area cussing and screaming at the nurses. She has rapid and aggressive speech which is a flight of ideas moving from one topic to another that is continuous. She has tried to get out of the bed once but now is monitored constantly by a nurse at the bedside. Dr. Toni Amendlapacs notified and ordered Geodon IM to calm the patient.

## 2015-10-11 NOTE — Progress Notes (Signed)
[facility] [place] [facility]   PATIENT NAME: Rhina BrackettJoan Grzesiak    MR#:  [id number]  DATE OF BIRTH:  27-Jan-1936  SUBJECTIVE:  CHIEF COMPLAINT:   Chief Complaint  Patient presents with  . Altered Mental Status   - Seen after ECT today. Patient resting well. -Was aggressive and confused and cussing this morning prior to ECT, requiring a dose of Geodon.  REVIEW OF SYSTEMS:  Review of Systems  Unable to perform ROS: mental acuity    DRUG ALLERGIES:   Allergies  Allergen Reactions  . Asa [Aspirin] Other (See Comments)    Unknown reaction  . Sulfa Antibiotics Other (See Comments)    Unknown reaction    VITALS:  Blood pressure 145/36, pulse 69, temperature 98.3 F (36.8 C), temperature source Axillary, resp. rate 20, height 5\' 2"  (1.575 m), weight 71.385 kg (157 lb 6 oz), SpO2 95 %.  PHYSICAL EXAMINATION:  Physical Exam  GENERAL:  80 y.o.-year-old patient lying in the bed. Well developed Sedated today EYES: Pupils equal, round, reactive to light and accommodation. No scleral icterus. Extraocular muscles intact.  HEENT: Head atraumatic, normocephalic. Oropharynx and nasopharynx clear.  NECK:  Supple, no jugular venous distention. No thyroid enlargement, no tenderness.  LUNGS: Normal breath sounds bilaterally, no wheezing, rales,rhonchi or crepitation. No use of accessory muscles of respiration.  CARDIOVASCULAR: S1, S2 normal. No murmurs, rubs, or gallops.  ABDOMEN: Soft, nontender, nondistended. Bowel sounds present. No organomegaly or mass.  EXTREMITIES: No pedal edema, cyanosis, or clubbing. Mittens to both hands for safety reasons. NEUROLOGIC:  Patient is sleepy, not cooperative for neuro exam today  - No focal deficits identified.  PSYCHIATRIC: The patient is sedated.Marland Kitchen.  SKIN: No obvious rash, lesion, or ulcer.    LABORATORY PANEL:   CBC  Recent Labs Lab 10/10/15 0650  WBC 6.7  HGB 11.0*  HCT 31.9*  PLT 177    ------------------------------------------------------------------------------------------------------------------  Chemistries   Recent Labs Lab 10/11/15 1144  NA 140  K 3.0*  CL 109  CO2 24  GLUCOSE 127*  BUN 7  CREATININE 0.67  CALCIUM 8.4*   ------------------------------------------------------------------------------------------------------------------  Cardiac Enzymes No results for input(s): TROPONINI in the last 168 hours. ------------------------------------------------------------------------------------------------------------------  RADIOLOGY:  No results found.  EKG:   Orders placed or performed during the hospital encounter of 10/03/15  . EKG 12-Lead  . EKG 12-Lead  . EKG 12-Lead  . EKG 12-Lead    ASSESSMENT AND PLAN:   80 year old female with known history of hypertension, hyperlipidemia, chronic low back pain and major depressive disorder requiring ECT treatments in the past with psychotic behavior presents to the hospital secondary to encephalopathy and major depression.  #1 Escherichia coli UTI-blood cultures are negative. Urine cultures with Escherichia coli. -Received treatment with Rocephin. Finished IV antibiotics.  #2 acute renal failure-secondary to ATN from infection. Resolved with IV fluids. -continue fluids- changed to NS  #3 Encephalopathy-secondary to major depression with psychosis. -Appreciate psych consult. Off Precedex drip.  -Patient on oral  Seroquel, Remeron and Zoloft. - Changed to IM zyprexa -Has responded to weekly ECT treatments for several years in the past. Recently stopped those and could have relapsed on her condition -ECT started 10/08/15, 2 treatments so far.  #4 DVT prophylaxis-on Lovenox.  #5 Hypokalemia- poor po intake, replace and recheck  Physical therapy when able to.   All the records are reviewed and case discussed with Care Management/Social Workerr. Management plans discussed with the patient,  family and they are in  agreement.  CODE STATUS: Full Code  TOTAL TIME TAKING CARE OF THIS PATIENT: 28 minutes.   POSSIBLE D/C IN ?  DAYS, DEPENDING ON CLINICAL CONDITION.   Enid Baas M.D on 10/11/2015 at 1:56 PM  Between 7am to 6pm - Pager - [phone]  After 6pm go to [email or web address] - password [email or web address] [facility]  [facility]  Office  [phone]  CC: Primary care physician; ADEROJU, Marin Olp, MD

## 2015-10-11 NOTE — Transfer of Care (Signed)
Immediate Anesthesia Transfer of Care Note  Patient: Jenna Dudley  Procedure(s) Performed: * No procedures listed *  Patient Location: PACU  Anesthesia Type:General  Level of Consciousness: sedated  Airway & Oxygen Therapy: Patient Spontanous Breathing and Patient connected to face mask oxygen  Post-op Assessment: Report given to RN and Post -op Vital signs reviewed and stable  Post vital signs: Reviewed and stable  Last Vitals:  Filed Vitals:   10/11/15 1029 10/11/15 1102  BP: 152/82 116/53  Pulse: 92   Temp:  36.2 C  Resp: 20     Complications: No apparent anesthesia complications

## 2015-10-11 NOTE — Anesthesia Procedure Notes (Signed)
Date/Time: 10/11/2015 10:47 AM Performed by: Stormy FabianURTIS, Mattea Seger Pre-anesthesia Checklist: Patient identified, Emergency Drugs available, Suction available and Patient being monitored Patient Re-evaluated:Patient Re-evaluated prior to inductionOxygen Delivery Method: Circle system utilized Preoxygenation: Pre-oxygenation with 100% oxygen Intubation Type: IV induction Ventilation: Mask ventilation without difficulty and Mask ventilation throughout procedure Airway Equipment and Method: Bite block Placement Confirmation: positive ETCO2 Dental Injury: Teeth and Oropharynx as per pre-operative assessment

## 2015-10-11 NOTE — Procedures (Signed)
ECT SERVICES Physician's Interval Evaluation & Treatment Note  Patient Identification: Jenna Dudley MRN:  [id number] Date of Evaluation:  10/11/2015 TX #: 2  MADRS:   MMSE:   P.E. Findings:  Physically stable vitals normal. Starting to eat better.  Psychiatric Interval Note:  Agitated confused hospital  Subjective:  [name] seen for evaluation for Electroconvulsive Therapy. Angry  Treatment Summary:   [x]   Right Unilateral             []  Bilateral   % Energy : 0.3 ms 100%   Impedance: 1680 ohms  Seizure Energy Index: 2341 V squared  Postictal Suppression Index: 59%  Seizure Concordance Index: 90%  Medications  Pre Shock: Brevital 60 mg, succinylcholine 80 mg  Post Shock: Toradol 30 mg  Seizure Duration: 17 seconds by EMG 51 seconds by EEG   Comments: Next treatment scheduled for Wednesday   Lungs:  [x]   Clear to auscultation               []  Other:   Heart:    [x]   Regular rhythm             []  irregular rhythm    [x]   Previous H&P reviewed, patient examined and there are NO CHANGES                 []   Previous H&P reviewed, patient examined and there are changes noted.   Mordecai RasmussenJohn Seven Dollens, MD 7/17/201710:57 AM

## 2015-10-11 NOTE — Progress Notes (Signed)
PT Cancellation Note  Patient Details Name: Jenna DaftJoan M Ashe MRN: [id number] DOB: [birth year]   Cancelled Treatment:    Reason Eval/Treat Not Completed: Other (comment). Notes reviewed this morn; it is noted that pt continues to have aggressive confusion and in appropriate for PT services at this time. Will continue to monitor notes for possible improved cognition allowing for PT participation in the near future; otherwise, if no improvement with complete current PT orders later this week.    Kristeen MissHeidi Elizabeth Bishop, VirginiaPTA 10/11/2015, 9:53 AM

## 2015-10-12 LAB — BASIC METABOLIC PANEL
Anion gap: 6 (ref 5–15)
BUN: 8 mg/dL (ref 6–20)
CHLORIDE: 110 mmol/L (ref 101–111)
CO2: 22 mmol/L (ref 22–32)
CREATININE: 0.58 mg/dL (ref 0.44–1.00)
Calcium: 8.4 mg/dL — ABNORMAL LOW (ref 8.9–10.3)
GFR calc Af Amer: >60 mL/min (ref >60)
GFR calc non Af Amer: >60 mL/min (ref >60)
GLUCOSE: 95 mg/dL (ref 65–99)
Potassium: 4.5 mmol/L (ref 3.5–5.1)
SODIUM: 138 mmol/L (ref 135–145)

## 2015-10-12 MED ORDER — LORAZEPAM 2 MG/ML IJ SOLN
2.0000 mg | Freq: Once | INTRAMUSCULAR | Status: AC
  Administered 2015-10-13: 2 mg via INTRAVENOUS
  Filled 2015-10-12: qty 1

## 2015-10-12 MED ORDER — HALOPERIDOL LACTATE 5 MG/ML IJ SOLN
5.0000 mg | Freq: Once | INTRAMUSCULAR | Status: AC
  Administered 2015-10-13: 5 mg via INTRAVENOUS
  Filled 2015-10-12: qty 1

## 2015-10-12 MED ORDER — MORPHINE SULFATE (PF) 2 MG/ML IV SOLN
1.0000 mg | Freq: Once | INTRAVENOUS | Status: AC
  Administered 2015-10-12: 1 mg via INTRAVENOUS
  Filled 2015-10-12: qty 1

## 2015-10-12 MED ORDER — ENSURE ENLIVE PO LIQD
237.0000 mL | Freq: Two times a day (BID) | ORAL | Status: DC
  Administered 2015-10-12 – 2015-10-15 (×3): 237 mL via ORAL

## 2015-10-12 MED ORDER — LOSARTAN POTASSIUM 25 MG PO TABS
25.0000 mg | ORAL_TABLET | Freq: Every day | ORAL | Status: DC
  Administered 2015-10-12 – 2015-10-15 (×3): 25 mg via ORAL
  Filled 2015-10-12 (×3): qty 1
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
## 2015-10-12 NOTE — Progress Notes (Signed)
Initial Nutrition Assessment      INTERVENTION:  -Monitor intake and cater to pt preferences -Recommend adding Ensure Enlive po BID, each supplement provides 350 kcal and 20 grams of protein -Will ask nursing to reweigh pt to determine any wt changes during admission.     NUTRITION DIAGNOSIS:   Inadequate oral intake related to acute illness as evidenced by meal completion < 25%.    GOAL:   Patient will meet greater than or equal to 90% of their needs    MONITOR:   PO intake, Supplement acceptance  REASON FOR ASSESSMENT:   LOS    ASSESSMENT:      Pt admitted with confusion and psychotic depression.  Receiving maintenance ECT  Past Medical History  Diagnosis Date  . Hypertension   . Hyperlipemia 07/29/14  . Chronic low back pain 07/29/14  . Spinal stenosis 07/29/14  . GERD (gastroesophageal reflux disease)   . Depression   . Esophageal spasm 07/29/14  . Bilateral carpal tunnel syndrome 07/27/14  . Degenerative arthritis of lumbar spine 07/27/14   Pt unable to answer questions regarding intake and wt loss.  Confused talking about all kinds of things. Noted per I and O sheet intake bites, cupcake, pinwheel, OJ, 25% for the past 9 days of admission  Medications reviewed: remeron,   Labs reviewed:K 3.0, glucose 127   Unable to complete Nutrition-Focused physical exam at this time. Pt wearing mittens, confused   Diet Order:  Diet regular Room service appropriate?: Yes; Fluid consistency:: Thin  Skin:  Reviewed, no issues  Last BM:  7/16  Height:   Ht Readings from Last 1 Encounters:  10/08/15 5\' 2"  (1.575 m)    Weight: 7% wt loss in the last 7 months  Noted measured wt on 7/9 of 157 pounds and wt on 7/14 157 pounds  Wt Readings from Last 1 Encounters:  10/08/15 157 lb 6 oz (71.385 kg)    Ideal Body Weight:     BMI:  Body mass index is 28.78 kg/(m^2).  Estimated Nutritional Needs:   Kcal:  1775-2130 kcals/d  Protein:  71-85 g/d  Fluid:   >/=177775ml/d  EDUCATION NEEDS:   No education needs identified at this time  Karry Causer B. Freida BusmanAllen, RD, LDN [phone] (pager) Weekend/On-Call pager [phone])
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
## 2015-10-12 NOTE — Progress Notes (Signed)
[facility] - [place] at [facility]   PATIENT NAME: Jenna Dudley    MR#:  [id number]  DATE OF BIRTH:  1935/05/16  SUBJECTIVE:  CHIEF COMPLAINT:   Chief Complaint  Patient presents with  . Altered Mental Status   - 2 ECT treatments so far, continues to be confused. -Agitated at times. Continues to have flight of thoughts. Continues to talk a lot.  REVIEW OF SYSTEMS:  Review of Systems  Unable to perform ROS: mental acuity    DRUG ALLERGIES:   Allergies  Allergen Reactions  . Asa [Aspirin] Other (See Comments)    Unknown reaction  . Sulfa Antibiotics Other (See Comments)    Unknown reaction    VITALS:  Blood pressure 189/64, pulse 85, temperature 98 F (36.7 C), temperature source Oral, resp. rate 20, height  (1.575 m), weight 70.58 kg (155 lb 9.6 oz), SpO2 95 %.  PHYSICAL EXAMINATION:  Physical Exam  GENERAL:  80 y.o.-year-old patient lying in the bed. Well developed Confused and agitated. EYES: Pupils equal, round, reactive to light and accommodation. No scleral icterus. Extraocular muscles intact.  HEENT: Head atraumatic, normocephalic. Oropharynx and nasopharynx clear.  NECK:  Supple, no jugular venous distention. No thyroid enlargement, no tenderness.  LUNGS: Normal breath sounds bilaterally, no wheezing, rales,rhonchi or crepitation. No use of accessory muscles of respiration.  CARDIOVASCULAR: S1, S2 normal. No murmurs, rubs, or gallops.  ABDOMEN: Soft, nontender, nondistended. Bowel sounds present. No organomegaly or mass.  EXTREMITIES: No pedal edema, cyanosis, or clubbing. Mittens to both hands for safety reasons. NEUROLOGIC:  Patient is alert, not cooperative for neuro exam today  - No focal deficits identified. Able to move all extremities in bed PSYCHIATRIC: The patient is alert.. Agitated and irritable. Continues to talk and flight of thoughts. SKIN: No obvious rash, lesion, or ulcer.    LABORATORY PANEL:   CBC  Recent  Labs Lab 10/10/15 0650  WBC 6.7  HGB 11.0*  HCT 31.9*  PLT 177   ------------------------------------------------------------------------------------------------------------------  Chemistries   Recent Labs Lab 10/11/15 1144  NA 140  K 3.0*  CL 109  CO2 24  GLUCOSE 127*  BUN 7  CREATININE 0.67  CALCIUM 8.4*   ------------------------------------------------------------------------------------------------------------------  Cardiac Enzymes No results for input(s): TROPONINI in the last 168 hours. ------------------------------------------------------------------------------------------------------------------  RADIOLOGY:  No results found.  EKG:   Orders placed or performed during the hospital encounter of 10/03/15  . EKG 12-Lead  . EKG 12-Lead  . EKG 12-Lead  . EKG 12-Lead    ASSESSMENT AND PLAN:   80 year old female with known history of hypertension, hyperlipidemia, chronic low back pain and major depressive disorder requiring ECT treatments in the past with psychotic behavior presents to the hospital secondary to encephalopathy and major depression.  #1 Escherichia coli UTI-blood cultures are negative. Urine cultures with Escherichia coli. -Received treatment with Rocephin. Finished IV antibiotics.  #2 acute renal failure-secondary to ATN from infection. Resolved with IV fluids. -continue fluids- changed to NS- until able to have good oral intake.  #3 Encephalopathy-secondary to major depression with psychosis. -Appreciate psych consult. Off Precedex drip.  -Patient on oral  Seroquel, Remeron and Zoloft. - Changed to IM zyprexa -Has responded to weekly ECT treatments for several years in the past. Recently stopped those and could have relapsed on her condition -ECT started 10/08/15, 2 treatments so far.  #4 DVT prophylaxis-on Lovenox.  #5 Hypokalemia- refused labs today, IV fluids and recheck Drinking orange juice  Physical therapy when able  to.  Unable to work due to noncooperation   All the records are reviewed and case discussed with Care Management/Social Workerr. Management plans discussed with the patient, family and they are in agreement.  CODE STATUS: Full Code  TOTAL TIME TAKING CARE OF THIS PATIENT: 24 minutes.   POSSIBLE D/C IN ?  DAYS, DEPENDING ON CLINICAL CONDITION.   Enid BaasKALISETTI,Sholonda Jobst M.D on 10/12/2015 at 12:53 PM  Between 7am to 6pm - Pager - [phone]  After 6pm go to [email or web address] - password [email or web address] [facility]  [facility]  Office  [phone]  CC: Primary care physician; ADEROJU, Marin OlpELIZABETH OYEYEMI, MD

## 2015-10-13 ENCOUNTER — Inpatient Hospital Stay: Payer: Medicare Other | Admitting: Registered Nurse

## 2015-10-13 LAB — GLUCOSE, CAPILLARY: Glucose-Capillary: 106 mg/dL — ABNORMAL HIGH (ref 65–99)

## 2015-10-13 MED ORDER — METHOHEXITAL SODIUM 100 MG/10ML IV SOSY
PREFILLED_SYRINGE | INTRAVENOUS | Status: DC | PRN
  Administered 2015-10-13: 60 mg via INTRAVENOUS

## 2015-10-13 MED ORDER — KETOROLAC TROMETHAMINE 30 MG/ML IJ SOLN
30.0000 mg | Freq: Once | INTRAMUSCULAR | Status: AC
  Administered 2015-10-13: 30 mg via INTRAVENOUS
  Filled 2015-10-13: qty 1

## 2015-10-13 MED ORDER — SODIUM CHLORIDE 0.9 % IV SOLN
INTRAVENOUS | Status: DC | PRN
  Administered 2015-10-13: 12:00:00 via INTRAVENOUS

## 2015-10-13 MED ORDER — SODIUM CHLORIDE 0.9 % IV SOLN: 250.0000 mL | Freq: Once | INTRAVENOUS | Status: DC

## 2015-10-13 MED ORDER — SODIUM CHLORIDE 0.9 % IV SOLN
INTRAVENOUS | Status: DC
  Administered 2015-10-13: 15:00:00 via INTRAVENOUS

## 2015-10-13 MED ORDER — SUCCINYLCHOLINE CHLORIDE 20 MG/ML IJ SOLN
INTRAMUSCULAR | Status: DC | PRN
  Administered 2015-10-13: 80 mg via INTRAVENOUS

## 2015-10-13 MED ORDER — KETOROLAC TROMETHAMINE 30 MG/ML IJ SOLN
INTRAMUSCULAR | Status: DC | PRN
  Administered 2015-10-13: 30 mg via INTRAVENOUS

## 2015-10-13 MED ORDER — LABETALOL HCL 5 MG/ML IV SOLN
INTRAVENOUS | Status: DC | PRN
  Administered 2015-10-13: 20 mg via INTRAVENOUS

## 2015-10-13 NOTE — Consult Note (Signed)
Jenna Dudley   Reason for Dudley:  Follow-up for this 80 year old woman with depression and now probable bipolar disorder. Receiving ECT to stabilize her mood.Jenna Dudley Referring Physician:  Wyatt Dudley Patient Identification: Jenna Dudley MRN:  [id number] Principal Diagnosis: Acute encephalopathy Diagnosis:   Patient Active Problem List   Diagnosis Date Noted  . Acute encephalopathy [G93.40] 10/04/2015  . Sepsis ([place]) [A41.9] 10/03/2015  . Acute pyelonephritis [N10] 10/03/2015  . Hypernatremia [E87.0] 10/03/2015  . Lactic acidosis [E87.2] 10/03/2015  . Acute renal insufficiency [N28.9] 10/03/2015  . Leukocytosis [D72.829] 10/03/2015  . Agitation [R45.1] 10/03/2015  . Severe recurrent major depression without psychotic features ([place]) [F33.2]   . Major depressive disorder, recurrent episode, severe, with psychotic behavior ([place]) [F33.3] 10/13/2014  . Severe episode of recurrent major depressive disorder, with psychotic features ([place]) [F33.3] 10/13/2014  . Major depressive disorder, 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 features ([place]) [F33.2] 10/06/2014  . Infection of urinary tract [N39.0] 09/17/2014  . Essential hypertension [I10] 09/17/2014  . Chronic back pain [M54.9, G89.29] 09/17/2014  . Gastric reflux [K21.9] 09/17/2014  . History of stroke [Z86.73] 09/17/2014  . Hypertension [I10] 09/16/2014  . Severe recurrent major depression with psychotic features ([place]) [F33.3]   . 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 Hammer toe [M20.40] 09/12/2013  . Foot pain [M79.673] 09/12/2013  . Fungal infection of nail [B35.1] 09/12/2013  . Carpal tunnel syndrome [G56.00] 12/21/2010  . Chronic LBP [M54.5, G89.29] 12/21/2010  . Degenerative arthritis of lumbar spine [M47.816] 12/21/2010  . Clinical depression [F32.9] 12/21/2010  . 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 HLD (hyperlipidemia) [E78.5] 12/21/2010  . BP (high blood pressure) [I10]  12/21/2010  . Acid reflux [K21.9] 12/21/2010  . Spinal stenosis [M48.00] 12/21/2010    Total Time spent with patient: 30 minutes  Subjective:   Jenna Dudley is a 80 y.o. female patient admitted with patient did not have any information to provide today she was asleep and not really arousable.Marland Kitchen  HPI:  Patient had ECT today. I had given her Ativan and Haldol prior to treatment to allow [place] to proceed with the treatment without the kind of agitation we saw 2 days ago. When I came to see her this afternoon she is again asleep. Seems to be pretty sedated today. Patient was extremely agitated yesterday still cursing and disorganized and manic.  Past Psychiatric History: Long-standing mood problems with depression and now mania with psychosis  Risk to Self: Is patient at risk for suicide?: No Risk to Others:   Prior Inpatient Therapy:   Prior Outpatient Therapy:    Past Medical History:  Past Medical History  Diagnosis Date  . Hypertension   . Hyperlipemia 07/29/14  . Chronic low back pain 07/29/14  . Spinal stenosis 07/29/14  . GERD (gastroesophageal reflux disease)   . Depression   . Esophageal spasm 07/29/14  . Bilateral carpal tunnel syndrome 07/27/14  . Degenerative arthritis of lumbar spine 07/27/14    Past Surgical History  Procedure Laterality Date  . Hip surgery Right   . Replacement total knee bilateral Bilateral 07/27/14  . Inner ear surgery Right    Family History:  Family History  Problem Relation Age of Onset  . Family history unknown: Yes   Family Psychiatric  History: Depression Social History:  History  Alcohol Use No     History  Drug Use No    Social History   Social History  . Marital Status: Married    Spouse Name: N/A  .  Number of Children: N/A  . Years of Education: N/A   Social History Main Topics  . Smoking status: Former Smoker    Types: Cigarettes    Quit date: 07/29/1975  . Smokeless tobacco: Never Used  . Alcohol Use: No  . Drug Use: No  .  Sexual Activity: No     Comment: postmenopause   Other Topics Concern  . None   Social History Narrative   Additional Social History:    Allergies:   Allergies  Allergen Reactions  . Asa [Aspirin] Other (See Comments)    Unknown reaction  . Sulfa Antibiotics Other (See Comments)    Unknown reaction    Labs:  Results for orders placed or performed during the hospital encounter of 10/03/15 (from the past 48 hour(s))  Basic metabolic panel     Status: Abnormal   Collection Time: 10/12/15 12:23 PM  Result Value Ref Range   Sodium 138 135 - 145 mmol/L   Potassium 4.5 3.5 - 5.1 mmol/L   Chloride 110 101 - 111 mmol/L   CO2 22 22 - 32 mmol/L   Glucose, Bld 95 65 - 99 mg/dL   BUN 8 6 - 20 mg/dL   Creatinine, Ser 0.58 0.44 - 1.00 mg/dL   Calcium 8.4 (L) 8.9 - 10.3 mg/dL   GFR calc non Af Amer >60 >60 mL/min   GFR calc Af Amer >60 >60 mL/min    Comment: (NOTE) The eGFR has been calculated using the CKD EPI equation. This calculation has not been validated in all clinical situations. eGFR's persistently <60 mL/min signify possible Chronic Kidney Disease.    Anion gap 6 5 - 15  Glucose, capillary     Status: Abnormal   Collection Time: 10/13/15  6:25 AM  Result Value Ref Range   Glucose-Capillary 106 (H) 65 - 99 mg/dL    Current Facility-Administered Medications  Medication Dose Route Frequency Provider Last Rate Last Dose  . 0.9 %  sodium chloride infusion   Intravenous Continuous Gladstone Lighter, MD 75 mL/hr at 10/13/15 1514    . acetaminophen (TYLENOL) tablet 650 mg  650 mg Oral Q6H PRN Theodoro Grist, MD       Or  . acetaminophen (TYLENOL) suppository 650 mg  650 mg Rectal Q6H PRN Theodoro Grist, MD   650 mg at 10/08/15 0312  . acetaminophen-codeine (TYLENOL #3) 300-30 MG per tablet 1 tablet  1 tablet Oral Q8H PRN Theodoro Grist, MD   1 tablet at 10/04/15 0812  . docusate sodium (COLACE) capsule 100 mg  100 mg Oral BID Theodoro Grist, MD   Stopped at 10/13/15 1000  .  enoxaparin (LOVENOX) injection 40 mg  40 mg Subcutaneous Q24H Theodoro Grist, MD   40 mg at 10/07/15 2256  . feeding supplement (ENSURE ENLIVE) (ENSURE ENLIVE) liquid 237 mL  237 mL Oral BID BM Gladstone Lighter, MD   Stopped at 10/13/15 1000  . fentaNYL (SUBLIMAZE) injection 25 mcg  25 mcg Intravenous Q5 min PRN Gunnar Bulla, MD      . haloperidol lactate (HALDOL) injection 5 mg  5 mg Intravenous Q2H PRN Gonzella Lex, MD      . losartan (COZAAR) tablet 25 mg  25 mg Oral Daily Gladstone Lighter, MD   Stopped at 10/13/15 1000  . mirtazapine (REMERON) tablet 45 mg  45 mg Oral QHS Theodoro Grist, MD   45 mg at 10/12/15 2327  . nystatin (MYCOSTATIN/NYSTOP) topical powder   Topical TID Awilda Bill, NP      .  OLANZapine (ZYPREXA) injection 15 mg  15 mg Intramuscular QHS Gonzella Lex, MD   15 mg at 10/12/15 2335  . ondansetron (ZOFRAN) tablet 4 mg  4 mg Oral Q6H PRN Theodoro Grist, MD       Or  . ondansetron (ZOFRAN) injection 4 mg  4 mg Intravenous Q6H PRN Theodoro Grist, MD      . ondansetron (ZOFRAN) injection 4 mg  4 mg Intravenous Once PRN Gunnar Bulla, MD      . pantoprazole (PROTONIX) EC tablet 40 mg  40 mg Oral Daily Theodoro Grist, MD   Stopped at 10/13/15 1000  . QUEtiapine (SEROQUEL) tablet 300 mg  300 mg Oral QHS Theodoro Grist, MD   300 mg at 10/12/15 2328  . sertraline (ZOLOFT) tablet 200 mg  200 mg Oral Hulen Skains, MD   200 mg at 10/10/15 0626  . simvastatin (ZOCOR) tablet 40 mg  40 mg Oral QHS Theodoro Grist, MD   40 mg at 10/12/15 2328  . sodium chloride flush (NS) 0.9 % injection 3 mL  3 mL Intravenous Q12H Theodoro Grist, MD   Stopped at 10/13/15 1000  . ziprasidone (GEODON) injection 10 mg  10 mg Intramuscular Q12H PRN Theodoro Grist, MD   10 mg at 10/12/15 0252   Facility-Administered Medications Ordered in Other Encounters  Medication Dose Route Frequency Provider Last Rate Last Dose  . labetalol (NORMODYNE,TRANDATE) injection 20 mg  20 mg Intravenous Once Gonzella Lex, MD      . labetalol (NORMODYNE,TRANDATE) injection 20 mg  20 mg Intravenous Once Gonzella Lex, MD      . lactated ringers infusion   Intravenous Continuous Gonzella Lex, MD      . methohexital Sodium 60 mg  60 mg Intravenous Once Gonzella Lex, MD      . methohexital Sodium 60 mg  60 mg Intravenous Once Gonzella Lex, MD      . succinylcholine (ANECTINE) injection 80 mg  80 mg Intravenous Once Gonzella Lex, MD      . succinylcholine (ANECTINE) injection 80 mg  80 mg Intravenous Once Gonzella Lex, MD        Musculoskeletal: Strength & Muscle Tone: decreased Gait & Station: unable to stand Patient leans: N/A  Psychiatric Specialty Exam: Physical Exam  Nursing note and vitals reviewed. Constitutional: She appears well-developed and well-nourished.  HENT:  Head: Normocephalic and atraumatic.  Eyes: Conjunctivae are normal. Pupils are equal, round, and reactive to light.  Neck: Normal range of motion.  Cardiovascular: Normal heart sounds.   Respiratory: Effort normal.  GI: Soft.  Musculoskeletal: Normal range of motion.  Neurological: She is alert.  Skin: Skin is warm and dry.    Review of Systems  Unable to perform ROS: medical condition    Blood pressure 112/61, pulse 69, temperature 97.2 F (36.2 C), temperature source Oral, resp. rate 24, height _0  (1.575 m), weight 69.854 kg (154 lb), SpO2 91 %.Body mass index is 28.16 kg/(m^2).  General Appearance: Fairly Groomed  Eye Contact:  None  Speech:  Negative  Volume:  Decreased  Mood:  Negative  Affect:  Negative  Thought Process:  Coherent  Orientation:  Negative  Thought Content:  Negative  Suicidal Thoughts:  No  Homicidal Thoughts:  No  Memory:  Negative  Judgement:  Negative  Insight:  Negative  Psychomotor Activity:  Negative  Concentration:  Concentration: Poor  Recall:  Poor  Fund of Knowledge:  Poor  Language:  Poor  Akathisia:  Negative  Handed:  Right  AIMS (if indicated):     Assets:   Housing Social Support  ADL's:  Impaired  Cognition:  Impaired,  Moderate  Sleep:        Treatment Plan Summary: Medication management and Plan Patient is still very sedated this afternoon recovering from ECT. Better that she get some rest and be sedated I think at this point as she hasn't been sleeping or resting much lately. We are still planning on continuing ECT with a tentative treatment next on Friday. Meanwhile continue current medicine as prescribed. Continue daily follow-up.  Disposition: Discussed crisis plan, support from social network, calling 911, coming to the Emergency Department, and calling Suicide Hotline.  Alethia Berthold, MD 10/13/2015 6:02 PM

## 2015-10-13 NOTE — Transfer of Care (Signed)
Immediate Anesthesia Transfer of Care Note  Patient: Jenna DaftJoan M Emmerich  Procedure(s) Performed: * No procedures listed *  Patient Location: PACU  Anesthesia Type:General  Level of Consciousness: sedated  Airway & Oxygen Therapy: Patient Spontanous Breathing and Patient connected to face mask oxygen  Post-op Assessment: Report given to RN and Post -op Vital signs reviewed and stable  Post vital signs: Reviewed and stable  Last Vitals:  Filed Vitals:   10/13/15 1214 10/13/15 1219  BP: 158/64 158/64  Pulse: 86 73  Temp: 36.3 C   Resp: 23 25    Last Pain:  Filed Vitals:   10/13/15 1219  PainSc: Asleep      Patients Stated Pain Goal: 0 (10/12/15 1204)  Complications: No apparent anesthesia complications

## 2015-10-13 NOTE — Progress Notes (Signed)
PT Cancellation Note  Patient Details Name: Jenna Dudley MRN: [id number] DOB: 1935/08/08   Cancelled Treatment:     Treatment attempted, pt currently out of room for an ECT treatment; will re attempt treatment tomorrow.    Kristeen MissHeidi Elizabeth Bishop, [place]PTA 10/13/2015, 11:53 AM

## 2015-10-13 NOTE — Progress Notes (Signed)
[facility] - [place] at [facility]   PATIENT NAME: Jenna BrackettJoan Hogans    MR#:  [id number]  DATE OF BIRTH:  [birth year]  SUBJECTIVE:  CHIEF COMPLAINT:   Chief Complaint  Patient presents with  . Altered Mental Status   -Patient admitted with encephalopathy, history of recurrent depression and currently going through mania with psychosis. - Status post 3rd ECT treatment today. Sedated now  REVIEW OF SYSTEMS:  Review of Systems  Unable to perform ROS: mental acuity    DRUG ALLERGIES:   Allergies  Allergen Reactions  . Asa [Aspirin] Other (See Comments)    Unknown reaction  . Sulfa Antibiotics Other (See Comments)    Unknown reaction    VITALS:  Blood pressure 112/61, pulse 69, temperature 97.2 F (36.2 C), temperature source Oral, resp. rate 24, height 5\' 2"  (1.575 m), weight 69.854 kg (154 lb), SpO2 91 %.  PHYSICAL EXAMINATION:  Physical Exam  GENERAL:  80 y.o.-year-old patient lying in the bed. Well developed Sedated from medications EYES: Pupils equal, round, reactive to light and accommodation. No scleral icterus. Extraocular muscles intact.  HEENT: Head atraumatic, normocephalic. Oropharynx and nasopharynx clear.  NECK:  Supple, no jugular venous distention. No thyroid enlargement, no tenderness.  LUNGS: Normal breath sounds bilaterally, no wheezing, rales,rhonchi or crepitation. No use of accessory muscles of respiration.  CARDIOVASCULAR: S1, S2 normal. No murmurs, rubs, or gallops.  ABDOMEN: Soft, nontender, nondistended. Bowel sounds present. No organomegaly or mass.  EXTREMITIES: No pedal edema, cyanosis, or clubbing. Mittens to both hands for safety reasons. NEUROLOGIC:  Usually not cooperative for exam due to underlying psychosis and paranoia. Sedated currently. Arousable and responding to name. Withdrawing to pain. Able to move all 4 extremities. PSYCHIATRIC: The patient is sedated, but arousable. SKIN: No obvious rash, lesion, or ulcer.     LABORATORY PANEL:   CBC  Recent Labs Lab 10/10/15 0650  WBC 6.7  HGB 11.0*  HCT 31.9*  PLT 177   ------------------------------------------------------------------------------------------------------------------  Chemistries   Recent Labs Lab 10/12/15 1223  NA 138  K 4.5  CL 110  CO2 22  GLUCOSE 95  BUN 8  CREATININE 0.58  CALCIUM 8.4*   ------------------------------------------------------------------------------------------------------------------  Cardiac Enzymes No results for input(s): TROPONINI in the last 168 hours. ------------------------------------------------------------------------------------------------------------------  RADIOLOGY:  No results found.  EKG:   Orders placed or performed during the hospital encounter of 10/03/15  . EKG 12-Lead  . EKG 12-Lead  . EKG 12-Lead  . EKG 12-Lead    ASSESSMENT AND PLAN:   80 year old female with known history of hypertension, hyperlipidemia, chronic low back pain and major depressive disorder requiring ECT treatments in the past with psychotic behavior presents to the hospital secondary to encephalopathy and major depression.  #1 Escherichia coli UTI-blood cultures are negative. Urine cultures with Escherichia coli. -Received treatment with Rocephin. Finished IV antibiotics.  #2 acute renal failure-secondary to ATN from infection. Resolved with IV fluids. -continue fluids- changed to NS- until able to have good oral intake.  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 Off Precedex drip.  -Patient on oral  Seroquel, Remeron and Zoloft. - Changed to IM zyprexa -Has responded to weekly ECT treatments for several years in the past. Recently stopped those and could have relapsed on her condition -ECT started 10/08/15, 3 treatments so far.  #4 DVT prophylaxis-on Lovenox.  #5 Hypokalemia- Improved with IV  fluids.  Physical therapy when able to. Unable to work due to  non-cooperation. Once able to ambulate and start eating, can be transferred to behavioral medicine unit   All the records are reviewed and case discussed with Care Management/Social Workerr. Management plans discussed with the patient, family and they are in agreement.  CODE STATUS: Full Code  TOTAL TIME TAKING CARE OF THIS PATIENT: 24 minutes.   POSSIBLE D/C IN ?  DAYS, DEPENDING ON CLINICAL CONDITION.   Enid Baas M.D on 10/13/2015 at 2:53 PM  Between 7am to 6pm - Pager - [phone]  After 6pm go to [email or web address] - password [email or web address] [facility]  [place] [place] [facility]  Office  [phone]  CC: Primary care physician; ADEROJU, Marin Olp, MD

## 2015-10-13 NOTE — Procedures (Signed)
ECT SERVICES Physician's Interval Evaluation & Treatment Note  Patient Identification: Jenna Dudley MRN:  [id number] Date of Evaluation:  10/13/2015 TX #: 3  MADRS:   MMSE:   P.E. Findings:  Heart normal lungs clear. Patient is currently sedated from getting medication prior to treatment. No new findings.  Psychiatric Interval Note:  Has continued to be agitated manic confused as of last night but this morning has been pretty sedated.  Subjective:  [name] seen for evaluation for Electroconvulsive Therapy. No specific complaint  Treatment Summary:   [x]   Right Unilateral             []  Bilateral   % Energy : 0.3 ms 100%   Impedance: 1480 ohms  Seizure Energy Index: 4601 V squared  Postictal Suppression Index: 72%  Seizure Concordance Index: 84%  Medications  Pre Shock: Brevital 60 mg succinylcholine 80 mg  Post Shock: Toradol 30 mg  Seizure Duration: 19 seconds by EMG, 46 seconds by EEG   Comments: Continue treatment most likely inpatient Friday   Lungs:  [x]   Clear to auscultation               []  Other:   Heart:    [x]   Regular rhythm             []  irregular rhythm    [x]   Previous H&P reviewed, patient examined and there are NO CHANGES                 []   Previous H&P reviewed, patient examined and there are changes noted.   Mordecai RasmussenJohn Placida Cambre, MD 7/19/201711:53 AM

## 2015-10-13 NOTE — OR Nursing (Signed)
Patient's bp dropped s/p ECT, was given 20 labetolol prior to procedure.  Dr. Karlton LemonKarenz contacted about the pressure and ordered 10 mg of Ephedrine as needed if pressure does not recover

## 2015-10-13 NOTE — OR Nursing (Signed)
Report to Christiana PellantEmily Dudley RN.

## 2015-10-13 NOTE — H&P (Signed)
Jenna Dudley is an 80 y.o. female.   Chief Complaint: Patient has no complaint this morning as she was given some Ativan and Haldol in advance of ECT because of prior agitation. 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 Hypertension   . Hyperlipemia 07/29/14  . Chronic low back pain 07/29/14  . Spinal stenosis 07/29/14  . GERD (gastroesophageal reflux disease)   . Depression   . Esophageal spasm 07/29/14  . Bilateral carpal tunnel syndrome 07/27/14  . Degenerative arthritis of lumbar spine 07/27/14    Past Surgical History  Procedure Laterality Date  . Hip surgery Right   . Replacement total knee bilateral Bilateral 07/27/14  . Inner ear surgery Right     Family History  Problem Relation Age of Onset  . Family history unknown: Yes   Social History:  reports that she quit smoking about 40 years ago. Her smoking use included Cigarettes. She has never used smokeless tobacco. She reports that she does not drink alcohol or use illicit drugs.  Allergies:  Allergies  Allergen Reactions  . Asa [Aspirin] Other (See Comments)    Unknown reaction  . Sulfa Antibiotics Other (See Comments)    Unknown reaction    Medications Prior to Admission  Medication Sig Dispense Refill  . acetaminophen-codeine (TYLENOL #3) 300-30 MG per tablet Take 1 tablet by mouth every 8 (eight) hours as needed for moderate pain or severe pain.    . hydrOXYzine (ATARAX/VISTARIL) 50 MG tablet Take 1 tablet (50 mg total) by mouth 3 (three) times daily as needed for anxiety. 30 tablet 0  . losartan (COZAAR) 25 MG tablet Take 1 tablet (25 mg total) by mouth daily. 30 tablet 0  . mirtazapine (REMERON) 45 MG tablet Take 1 tablet (45 mg total) by mouth at bedtime. 30 tablet 6  . OLANZapine (ZYPREXA) 20 MG tablet Take 1 tablet (20 mg total) by mouth at bedtime. 30 tablet 6  .  omeprazole (PRILOSEC) 20 MG capsule Take 20 mg by mouth 2 (two) times daily before a meal.     . potassium chloride SA (K-DUR,KLOR-CON) 20 MEQ tablet Take 20 mEq by mouth daily.    . QUEtiapine (SEROQUEL) 300 MG tablet Take 1 tablet (300 mg total) by mouth at bedtime. 30 tablet 6  . sertraline (ZOLOFT) 100 MG tablet Take 2 tablets (200 mg total) by mouth daily. 2 tabs in the am (Patient taking differently: Take 200 mg by mouth every morning. ) 180 tablet 6  . simvastatin (ZOCOR) 40 MG tablet Take 40 mg by mouth at bedtime.       Results for orders placed or performed during the hospital encounter of 10/03/15 (from the past 48 hour(s))  Basic metabolic panel     Status: Abnormal   Collection Time: 10/11/15 11:44 AM  Result Value Ref Range   Sodium 140 135 - 145 mmol/L   Potassium 3.0 (L) 3.5 - 5.1 mmol/L   Chloride 109 101 - 111 mmol/L   CO2 24 22 - 32 mmol/L   Glucose, Bld 127 (H) 65 - 99 mg/dL   BUN 7 6 - 20 mg/dL   Creatinine, Ser 0.67 0.44 - 1.00 mg/dL   Calcium 8.4 (L) 8.9 - 10.3 mg/dL   GFR calc non Af Amer >60 >60 mL/min   GFR calc Af Amer >60 >60 mL/min    Comment: (  NOTE) The eGFR has been calculated using the CKD EPI equation. This calculation has not been validated in all clinical situations. eGFR's persistently <60 mL/min signify possible Chronic Kidney Disease.    Anion gap 7 5 - 15  Basic metabolic panel     Status: Abnormal   Collection Time: 10/12/15 12:23 PM  Result Value Ref Range   Sodium 138 135 - 145 mmol/L   Potassium 4.5 3.5 - 5.1 mmol/L   Chloride 110 101 - 111 mmol/L   CO2 22 22 - 32 mmol/L   Glucose, Bld 95 65 - 99 mg/dL   BUN 8 6 - 20 mg/dL   Creatinine, Ser 0.58 0.44 - 1.00 mg/dL   Calcium 8.4 (L) 8.9 - 10.3 mg/dL   GFR calc non Af Amer >60 >60 mL/min   GFR calc Af Amer >60 >60 mL/min    Comment: (NOTE) The eGFR has been calculated using the CKD EPI equation. This calculation has not been validated in all clinical situations. eGFR's persistently  <60 mL/min signify possible Chronic Kidney Disease.    Anion gap 6 5 - 15  Glucose, capillary     Status: Abnormal   Collection Time: 10/13/15  6:25 AM  Result Value Ref Range   Glucose-Capillary 106 (H) 65 - 99 mg/dL   No results found.  Review of Systems  Unable to perform ROS: patient unresponsive    Blood pressure 153/80, pulse 90, temperature 97.1 F (36.2 C), temperature source Oral, resp. rate 18, height '5\' 2"'  (1.575 m), weight 69.854 kg (154 lb), SpO2 98 %. Physical Exam  Nursing note and vitals reviewed. Constitutional: She appears well-developed and well-nourished.  HENT:  Head: Normocephalic and atraumatic.  Eyes: Conjunctivae are normal. Pupils are equal, round, and reactive to light.  Neck: Normal range of motion.  Cardiovascular: Regular rhythm and normal heart sounds.   Respiratory: Effort normal and breath sounds normal. No respiratory distress.  GI: Soft.  Musculoskeletal: Normal range of motion.  Neurological: She is alert.  Skin: Skin is warm and dry.  Psychiatric: Her affect is blunt.  Patient is currently unconscious having been given medicine prior to ECT because of her prior agitation. Unable to give any history. Lungs are clear to auscultation. Heart regular rate and rhythm. No new obvious physical findings.     Assessment/Plan Patient is receiving ECT. Previous treatments themselves have gone well but getting her ready for them has been difficult because of her agitation. She was given medication intravenously for sedation for this treatment to facilitate safety of treatment.  Alethia Berthold, MD 10/13/2015, 10:01 AM

## 2015-10-13 NOTE — Anesthesia Preprocedure Evaluation (Signed)
Anesthesia Evaluation  Patient identified by MRN, date of birth, ID band Patient awake    Reviewed: Allergy & Precautions, NPO status , Patient's Chart, lab work & pertinent test results, reviewed documented beta blocker date and time   History of Anesthesia Complications Negative for: history of anesthetic complications  Airway Mallampati: II  TM Distance: >3 FB     Dental  (+) Chipped   Pulmonary former smoker,           Cardiovascular hypertension, Pt. on medications      Neuro/Psych PSYCHIATRIC DISORDERS  Neuromuscular disease    GI/Hepatic GERD  Controlled,  Endo/Other    Renal/GU Renal InsufficiencyRenal disease     Musculoskeletal  (+) Arthritis ,   Abdominal   Peds  Hematology   Anesthesia Other Findings Past Medical History:   Hypertension                                                 Hyperlipemia                                    07/29/14       Chronic low back pain                           07/29/14       Spinal stenosis                                 07/29/14       GERD (gastroesophageal reflux disease)                       Depression                                                   Esophageal spasm                                07/29/14       Bilateral carpal tunnel syndrome                07/27/14       Degenerative arthritis of lumbar spine          07/27/14      Patient is oriented x1, and is very combative  Reproductive/Obstetrics negative OB ROS                             Anesthesia Physical  Anesthesia Plan  ASA: III  Anesthesia Plan: General   Post-op Pain Management:    Induction: Intravenous  Airway Management Planned: Mask  Additional Equipment:   Intra-op Plan:   Post-operative Plan:   Informed Consent: I have reviewed the patients History and Physical, chart, labs and discussed the procedure including the risks, benefits and alternatives for the  proposed anesthesia with the patient or authorized representative who has indicated his/her understanding and acceptance.       Plan Discussed with: CRNA, Anesthesiologist and Surgeon  Anesthesia Plan Comments:         Anesthesia Quick Evaluation

## 2015-10-14 ENCOUNTER — Other Ambulatory Visit: Payer: Self-pay | Admitting: Psychiatry

## 2015-10-14 MED ORDER — SODIUM CHLORIDE 0.9 % IV SOLN: 250.0000 mL | Freq: Once | INTRAVENOUS | Status: DC

## 2015-10-14 NOTE — Progress Notes (Signed)
Physical Therapy Treatment Patient Details Name: Galen DaftJoan M Wiland MRN: [id number] DOB: 09-20-1935 Today's Date: 10/14/2015    History of Present Illness Pt is a 80 y/o female that presents with acute delerium, underlying UTI and non-compliance with home psychiatric medications. Started on precedex drip as she was combative, agitated initially.     PT Comments    Pt agreeable to PT today; demonstrates pleasant, non aggressive demeanor today. Pt continues to display rapid speech/thought patterns, but alternating with short periods of in the present situational conversation. Attempted participation in lower extremity exercises with pt, but unable to follow commands, or stay focused for effective outcome. Pt does, however, wish to get up to the chair (that she feels she purchased). Pt refuses use of rolling walker, but is able to perform stand transfer and several steps to chair with bilateral upper extremities supported on therapist and Min A. Pt comfortable up in chair; reinforced several times the need for pt to call/received assist for getting up. Pt states she understands and is agreeable. Continue PT to progress strength, endurance and balance for improved functional mobility.   Follow Up Recommendations        Equipment Recommendations       Recommendations for Other Services       Precautions / Restrictions Restrictions Weight Bearing Restrictions: No    Mobility  Bed Mobility Overal bed mobility: Needs Assistance Bed Mobility: Supine to Sit     Supine to sit: HOB elevated;Min assist     General bed mobility comments: Requires Min A for safety, as pt unaware when feet tangled in sheet, as she mobilizes BLEs off bed to floor  Transfers Overall transfer level: Needs assistance Equipment used: None (pt refuses use of rw; 1 person assist) Transfers: Sit to/from Stand Sit to Stand: Min assist         General transfer comment: Refuses use of rw. 1 person assist. Poor upright  posture  Ambulation/Gait Ambulation/Gait assistance: Min assist Ambulation Distance (Feet): 3 Feet Assistive device: None (1 person assist) Gait Pattern/deviations: Step-to pattern;Trunk flexed;Wide base of support Gait velocity: decreased Gait velocity interpretation: Below normal speed for age/gender General Gait Details: Pt takes short choppy steps bed to chair with 1 person Min A for steadiness/safety    Stairs            Wheelchair Mobility    Modified Rankin (Stroke Patients Only)       Balance Overall balance assessment: Needs assistance Sitting-balance support: Feet supported;Bilateral upper extremity supported Sitting balance-Leahy Scale: Good     Standing balance support: Bilateral upper extremity supported (on therapist) Standing balance-Leahy Scale: Poor Standing balance comment: Pt relies on B hand supported on therapists upper arms for short ambulation bed to chair                    Cognition Arousal/Alertness: Awake/alert Behavior During Therapy: [facility]FL for tasks assessed/performed;Impulsive (alternating present and random thought/speech) Overall Cognitive Status: Impaired/Different from baseline Area of Impairment: Orientation;Attention;Following commands;Safety/judgement;Awareness;Problem solving Orientation Level: Disoriented to;Place;Time;Situation Current Attention Level: Alternating Memory: Decreased short-term memory Following Commands: Follows one step commands inconsistently Safety/Judgement: Decreased awareness of safety;Decreased awareness of deficits   Problem Solving: Difficulty sequencing;Requires verbal cues General Comments: Pt alternated between rational appropriate in the present conversation for brief periods to randon high speed/numerous thought patterns . Unable to follow commands for participation in exercises due to lack of focus, but able to do so for bed to chair transfer    Exercises Other  Exercises Other Exercises:  attempted LE exercises; pt unable to stay focused, or follow commands for effective participation in exercises    General Comments        Pertinent Vitals/Pain Pain Assessment: No/denies pain    Home Living                      Prior Function            PT Goals (current goals can now be found in the care plan section) Progress towards PT goals: Progressing toward goals    Frequency  Min 2X/week    PT Plan Current plan remains appropriate    Co-evaluation             End of Session   Activity Tolerance: Other (comment) (limited by cognition) Patient left: in chair;with call bell/phone within reach;with chair alarm set;Other (comment) (reinforced need to call/have assist to get up; pt agrees)     Time: 1308-6578 PT Time Calculation (min) (ACUTE ONLY): 24 min  Charges:  $Therapeutic Activity: 23-37 mins                    G CodesKristeen Miss, PTA 10/14/2015, 2:11 PM

## 2015-10-14 NOTE — Anesthesia Postprocedure Evaluation (Signed)
Anesthesia Post Note  Patient: Jenna Dudley  Procedure(s) Performed: * No procedures listed *  Patient location during evaluation: PACU Anesthesia Type: General Pain management: pain level controlled Vital Signs Assessment: post-procedure vital signs reviewed and stable Respiratory status: spontaneous breathing, nonlabored ventilation, respiratory function stable and patient connected to nasal cannula oxygen Cardiovascular status: blood pressure returned to baseline and stable Postop Assessment: no signs of nausea or vomiting Anesthetic complications: no    Last Vitals:  Filed Vitals:   10/14/15 0550 10/14/15 1218  BP: 134/59 157/54  Pulse: 84 88  Temp: 36.8 C   Resp: 20 17    Last Pain:  Filed Vitals:   10/14/15 1443  PainSc: 0-No pain                 Lenard SimmerAndrew Thelton Graca

## 2015-10-14 NOTE — Progress Notes (Signed)
[facility] [place] [facility]   PATIENT NAME: Jenna BrackettJoan Dudley    MRN#:  [id number]  DATE OF BIRTH:  [birth year]  SUBJECTIVE:  Hospital Day: 11 days Jenna Dudley is a 80 y.o. female presenting with Altered Mental Status .   Overnight events: No overnight events Interval Events: Confused this morning: "I gave Dr. Toni Amendlapacs $8 million, how much money can give you can be discharged"  REVIEW OF SYSTEMS:  CONSTITUTIONAL: No fever, fatigue or weakness.  EYES: No blurred or double vision.  EARS, NOSE, AND THROAT: No tinnitus or ear pain.  RESPIRATORY: No cough, shortness of breath, wheezing or hemoptysis.  CARDIOVASCULAR: No chest pain, orthopnea, edema.  GASTROINTESTINAL: No nausea, vomiting, diarrhea or abdominal pain.  GENITOURINARY: No dysuria, hematuria.  ENDOCRINE: No polyuria, nocturia,  HEMATOLOGY: No anemia, easy bruising or bleeding SKIN: No rash or lesion. MUSCULOSKELETAL: No joint pain or arthritis.   NEUROLOGIC: No tingling, numbness, weakness.  PSYCHIATRY: Denies anxiety or depression.   DRUG ALLERGIES:   Allergies  Allergen Reactions  . Asa [Aspirin] Other (See Comments)    Unknown reaction  . Sulfa Antibiotics Other (See Comments)    Unknown reaction    VITALS:  Blood pressure 157/54, pulse 88, temperature 98.3 F (36.8 C), temperature source Oral, resp. rate 17, height 5\' 2"  (1.575 m), weight 154 lb (69.854 kg), SpO2 99 %.  PHYSICAL EXAMINATION:  VITAL SIGNS: Filed Vitals:   10/14/15 0550 10/14/15 1218  BP: 134/59 157/54  Pulse: 84 88  Temp: 98.3 F (36.8 C)   Resp: 20 17   GENERAL:80 y.o.female currently in no acute distress.  HEAD: Normocephalic, atraumatic.  EYES: Pupils equal, round, reactive to light. Extraocular muscles intact. No scleral icterus.  MOUTH: Moist mucosal membrane. Dentition intact. No abscess noted.  EAR, NOSE, THROAT: Clear without exudates. No external lesions.  NECK: Supple. No thyromegaly. No nodules.  No JVD.  PULMONARY: Clear to ascultation, without wheeze rails or rhonci. No use of accessory muscles, Good respiratory effort. good air entry bilaterally CHEST: Nontender to palpation.  CARDIOVASCULAR: S1 and S2. Regular rate and rhythm. No murmurs, rubs, or gallops. No edema. Pedal pulses 2+ bilaterally.  GASTROINTESTINAL: Soft, nontender, nondistended. No masses. Positive bowel sounds. No hepatosplenomegaly.  MUSCULOSKELETAL: No swelling, clubbing, or edema. Range of motion full in all extremities.  NEUROLOGIC: Cranial nerves II through XII are intact. No gross focal neurological deficits. Sensation intact. Reflexes intact.  SKIN: No ulceration, lesions, rashes, or cyanosis. Skin warm and dry. Turgor intact.  PSYCHIATRIC: Mood, affect anxious. The patient is awake, alert and oriented x 3. Insight, judgment poor.      LABORATORY PANEL:   CBC  Recent Labs Lab 10/10/15 0650  WBC 6.7  HGB 11.0*  HCT 31.9*  PLT 177   ------------------------------------------------------------------------------------------------------------------  Chemistries   Recent Labs Lab 10/12/15 1223  NA 138  K 4.5  CL 110  CO2 22  GLUCOSE 95  BUN 8  CREATININE 0.58  CALCIUM 8.4*   ------------------------------------------------------------------------------------------------------------------  Cardiac Enzymes No results for input(s): TROPONINI in the last 168 hours. ------------------------------------------------------------------------------------------------------------------  RADIOLOGY:  No results found.  EKG:   Orders placed or performed during the hospital encounter of 10/03/15  . EKG 12-Lead  . EKG 12-Lead  . EKG 12-Lead  . EKG 12-Lead    ASSESSMENT AND PLAN:   Jenna Dudley is a 80 y.o. female presenting with Altered Mental Status . Admitted 10/03/2015 : Day #: 11 days #1 Escherichia coli UTI-blood cultures are  negative. Urine cultures with Escherichia coli. -Received  treatment with Rocephin. Finished IV antibiotics.  #2 acute renal failure-secondary to ATN from infection. Resolved with IV fluids. -continue fluids- changed to NS- until able to have good oral intake.  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 Off Precedex drip.  -Patient on oral Seroquel, Remeron and Zoloft. - Changed to IM zyprexa -Has responded to weekly ECT treatments for several years in the past. Recently stopped those and could have relapsed on her condition -ECT started 10/08/15, 3 treatments so far.  #4 DVT prophylaxis-on Lovenox.  #5 Hypokalemia- Improved with IV fluids.   All the records are reviewed and case discussed with Care Management/Social Workerr. Management plans discussed with the patient, family and they are in agreement.  CODE STATUS: full TOTAL TIME TAKING CARE OF THIS PATIENT: 28 minutes.   POSSIBLE D/C IN 2-3DAYS, DEPENDING ON CLINICAL CONDITION.   Hower,  [name].D on 10/14/2015 at 1:21 PM  Between 7am to 6pm - Pager - [phone]  After 6pm: House Pager: - [phone]  [facility]  Office  [phone]  CC: Primary care physician; Toya Smothers, MD

## 2015-10-14 NOTE — Progress Notes (Signed)
Primary RN Attempted IV insertion x1, another floor RN also attempted IV insertion x1. Patient became agitated. Patient does have care/order in place ok to leave IV out. Oncoming shift RN aware, pt resting in bed.

## 2015-10-14 NOTE — Consult Note (Signed)
[facility]   Reason for Consult:  Follow-up for this 80 year old woman with depression and now probable bipolar disorder. Receiving ECT to stabilize her mood.Kalisetti Referring Physician:  Prudencio PairKalisetti S Patient Identification: Jenna DaftJoan M Dudley MRN:  [id number] Principal Diagnosis: Bipolar disorder type I mixed manic state Diagnosis:   Patient Active Problem List   Diagnosis Date Noted  . Acute encephalopathy [G93.40] 10/04/2015  . Sepsis (HCC) [A41.9] 10/03/2015  . Acute pyelonephritis [N10] 10/03/2015  . Hypernatremia [E87.0] 10/03/2015  . Lactic acidosis [E87.2] 10/03/2015  . Acute renal insufficiency [N28.9] 10/03/2015  . Leukocytosis [D72.829] 10/03/2015  . Agitation [R45.1] 10/03/2015  . Severe recurrent major depression without psychotic features (HCC) [F33.2]   . Major depressive disorder, recurrent episode, severe, with psychotic behavior (HCC) [F33.3] 10/13/2014  . Severe episode of recurrent major depressive disorder, with psychotic features (HCC) [F33.3] 10/13/2014  . Major depressive disorder, 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 features (HCC) [F33.2] 10/06/2014  . Infection of urinary tract [N39.0] 09/17/2014  . Essential hypertension [I10] 09/17/2014  . Chronic back pain [M54.9, G89.29] 09/17/2014  . Gastric reflux [K21.9] 09/17/2014  . History of stroke [Z86.73] 09/17/2014  . Hypertension [I10] 09/16/2014  . Severe recurrent major depression with psychotic features (HCC) [F33.3]   . 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 Hammer toe [M20.40] 09/12/2013  . Foot pain [M79.673] 09/12/2013  . Fungal infection of nail [B35.1] 09/12/2013  . Carpal tunnel syndrome [G56.00] 12/21/2010  . Chronic LBP [M54.5, G89.29] 12/21/2010  . Degenerative arthritis of lumbar spine [M47.816] 12/21/2010  . Clinical depression [F32.9] 12/21/2010  . 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 HLD (hyperlipidemia) [E78.5] 12/21/2010  . BP (high blood  pressure) [I10] 12/21/2010  . Acid reflux [K21.9] 12/21/2010  . Spinal stenosis [M48.00] 12/21/2010    Total Time spent with patient: 30 minutes  Subjective:   Jenna DaftJoan M Dudley is a 80 y.o. female   Follow-up for this 80 year old woman with what now looks more like bipolar disorder who has been receiving treatment for what I would now described as a mixed manic condition. Patient seen. Chart reviewed. Patient has now had 3 ECT treatments in a row and finally she seems to be much better today. She was sitting up out of bed. Had her glasses on. Much better groomed. Good eye contact. Conversation was much more appropriate than previously. She did not shout at me and she did not curse at me and she did not threaten to punch anyone today. Nevertheless she still has some paranoia around her treatment and does not have very good insight.  HPI:  Patient had ECT today. I had given her Ativan and Haldol prior to treatment to allow us to proceed with the treatment without the kind of agitation we saw 2 days ago. When I came to see her this afternoon she is again asleep. Seems to be pretty sedated today. Patient was extremely agitated yesterday still cursing and disorganized and manic.  Past Psychiatric History: Long-standing mood problems with depression and now mania with psychosis  Risk to Self: Is patient at risk for suicide?: No Risk to Others:   Prior Inpatient Therapy:   Prior Outpatient Therapy:    Past Medical History:  Past Medical History  Diagnosis Date  . Hypertension   . Hyperlipemia 07/29/14  . Chronic low back pain 07/29/14  . Spinal stenosis 07/29/14  . GERD (gastroesophageal reflux disease)   . Depression   . Esophageal spasm 07/29/14  . Bilateral carpal tunnel syndrome 07/27/14  .  Degenerative arthritis of lumbar spine 07/27/14    Past Surgical History  Procedure Laterality Date  . Hip surgery Right   . Replacement total knee bilateral Bilateral 07/27/14  . Inner ear surgery Right     Family History:  Family History  Problem Relation Age of Onset  . Family history unknown: Yes   Family Psychiatric  History: Depression Social History:  History  Alcohol Use No     History  Drug Use No    Social History   Social History  . Marital Status: Married    Spouse Name: N/A  . Number of Children: N/A  . Years of Education: N/A   Social History Main Topics  . Smoking status: Former Smoker    Types: Cigarettes    Quit date: 07/29/1975  . Smokeless tobacco: Never Used  . Alcohol Use: No  . Drug Use: No  . Sexual Activity: No     Comment: postmenopause   Other Topics Concern  . None   Social History Narrative   Additional Social History:    Allergies:   Allergies  Allergen Reactions  . Asa [Aspirin] Other (See Comments)    Unknown reaction  . Sulfa Antibiotics Other (See Comments)    Unknown reaction    Labs:  Results for orders placed or performed during the hospital encounter of 10/03/15 (from the past 48 hour(s))  Glucose, capillary     Status: Abnormal   Collection Time: 10/13/15  6:25 AM  Result Value Ref Range   Glucose-Capillary 106 (H) 65 - 99 mg/dL    Current Facility-Administered Medications  Medication Dose Route Frequency Provider Last Rate Last Dose  . 0.9 %  sodium chloride infusion   Intravenous Continuous Enid Baas, MD 75 mL/hr at 10/13/15 1514    . acetaminophen (TYLENOL) tablet 650 mg  650 mg Oral Q6H PRN Katharina Caper, MD       Or  . acetaminophen (TYLENOL) suppository 650 mg  650 mg Rectal Q6H PRN Katharina Caper, MD   650 mg at 10/08/15 0312  . acetaminophen-codeine (TYLENOL #3) 300-30 MG per tablet 1 tablet  1 tablet Oral Q8H PRN Katharina Caper, MD   1 tablet at 10/04/15 0812  . docusate sodium (COLACE) capsule 100 mg  100 mg Oral BID Katharina Caper, MD   Stopped at 10/13/15 1000  . enoxaparin (LOVENOX) injection 40 mg  40 mg Subcutaneous Q24H Katharina Caper, MD   40 mg at 10/13/15 2309  . feeding supplement (ENSURE  ENLIVE) (ENSURE ENLIVE) liquid 237 mL  237 mL Oral BID BM Enid Baas, MD   237 mL at 10/14/15 1000  . fentaNYL (SUBLIMAZE) injection 25 mcg  25 mcg Intravenous Q5 min PRN Berdine Addison, MD      . haloperidol lactate (HALDOL) injection 5 mg  5 mg Intravenous Q2H PRN Audery Amel, MD      . losartan (COZAAR) tablet 25 mg  25 mg Oral Daily Enid Baas, MD   25 mg at 10/14/15 1050  . mirtazapine (REMERON) tablet 45 mg  45 mg Oral QHS Katharina Caper, MD   45 mg at 10/13/15 2310  . nystatin (MYCOSTATIN/NYSTOP) topical powder   Topical TID Eugenie Norrie, NP      . OLANZapine (ZYPREXA) injection 15 mg  15 mg Intramuscular QHS Audery Amel, MD   15 mg at 10/13/15 2309  . ondansetron (ZOFRAN) tablet 4 mg  4 mg Oral Q6H PRN Katharina Caper, MD  Or  . ondansetron (ZOFRAN) injection 4 mg  4 mg Intravenous Q6H PRN Katharina Caper, MD      . ondansetron (ZOFRAN) injection 4 mg  4 mg Intravenous Once PRN Berdine Addison, MD      . pantoprazole (PROTONIX) EC tablet 40 mg  40 mg Oral Daily Katharina Caper, MD   40 mg at 10/14/15 1050  . QUEtiapine (SEROQUEL) tablet 300 mg  300 mg Oral QHS Katharina Caper, MD   300 mg at 10/13/15 2310  . sertraline (ZOLOFT) tablet 200 mg  200 mg Oral Reita Chard, MD   200 mg at 10/14/15 1051  . simvastatin (ZOCOR) tablet 40 mg  40 mg Oral QHS Katharina Caper, MD   40 mg at 10/13/15 2311  . sodium chloride flush (NS) 0.9 % injection 3 mL  3 mL Intravenous Q12H Katharina Caper, MD   Stopped at 10/13/15 1000  . ziprasidone (GEODON) injection 10 mg  10 mg Intramuscular Q12H PRN Katharina Caper, MD   10 mg at 10/12/15 0252   Facility-Administered Medications Ordered in Other Encounters  Medication Dose Route Frequency Provider Last Rate Last Dose  . labetalol (NORMODYNE,TRANDATE) injection 20 mg  20 mg Intravenous Once Audery Amel, MD      . labetalol (NORMODYNE,TRANDATE) injection 20 mg  20 mg Intravenous Once Audery Amel, MD      . lactated ringers infusion    Intravenous Continuous Audery Amel, MD      . methohexital Sodium 60 mg  60 mg Intravenous Once Audery Amel, MD      . methohexital Sodium 60 mg  60 mg Intravenous Once Audery Amel, MD      . succinylcholine (ANECTINE) injection 80 mg  80 mg Intravenous Once Audery Amel, MD      . succinylcholine (ANECTINE) injection 80 mg  80 mg Intravenous Once Audery Amel, MD        Musculoskeletal: Strength & Muscle Tone: decreased Gait & Station: unable to stand Patient leans: N/A  Psychiatric Specialty Exam: Physical Exam  Nursing note and vitals reviewed. Constitutional: She appears well-developed and well-nourished.  HENT:  Head: Normocephalic and atraumatic.  Eyes: Conjunctivae are normal. Pupils are equal, round, and reactive to light.  Neck: Normal range of motion.  Cardiovascular: Regular rhythm and normal heart sounds.   Respiratory: Effort normal. No respiratory distress.  GI: Soft.  Musculoskeletal: Normal range of motion.  Neurological: She is alert.  Skin: Skin is warm and dry.  Psychiatric: Her behavior is normal. Her mood appears anxious. Her affect is labile. Her speech is tangential. Thought content is paranoid. Cognition and memory are impaired. She expresses impulsivity.  Patient is still tangential in her thinking and a little bit labile in her affect although the degree of these things is much improved compared to the last several days. She is much more capable now engaging in lucid conversation but there is still a clear strain of paranoia and confusion to some of her thinking.    Review of Systems  Unable to perform ROS: medical condition  Constitutional: Negative.   HENT: Negative.   Eyes: Negative.   Respiratory: Negative.   Cardiovascular: Negative.   Gastrointestinal: Negative.   Musculoskeletal: Negative.   Skin: Negative.   Neurological: Negative.   Psychiatric/Behavioral: Negative for depression, suicidal ideas, hallucinations, memory loss and  substance abuse. The patient is nervous/anxious. The patient does not have insomnia.     Blood pressure 157/54, pulse 88,  temperature 98.3 F (36.8 C), temperature source Oral, resp. rate 17, height 5\' 2"  (1.575 m), weight 69.854 kg (154 lb), SpO2 99 %.Body mass index is 28.16 kg/(m^2).  General Appearance: Fairly Groomed  Eye Contact:  None  Speech:  Negative  Volume:  Decreased  Mood:  Negative  Affect:  Negative  Thought Process:  Coherent  Orientation:  Full (Time, Place, and Person)  Thought Content:  Tangential  Suicidal Thoughts:  No  Homicidal Thoughts:  No  Memory:  Negative Immediate;   Good Recent;   Poor Remote;   Fair  Judgement:  Other:  I think her judgment is still clouded although it's much better than it was previously.  Insight:  Fair  Psychomotor Activity:  Decreased  Concentration:  Concentration: Fair  Recall:  Poor  Fund of Knowledge:  Fair  Language:  Fair  Akathisia:  Negative  Handed:  Right  AIMS (if indicated):     Assets:  Housing Social Support  ADL's:  Intact  Cognition:  Impaired,  Mild and Moderate  Sleep:        Treatment Plan Summary: Medication management and Plan Kimbery is obviously vastly better today although she is still not at what I would think of as her baseline. There is still some paranoia and confusion. She tells me several times that she is going home today and hasn't appointment to get her hair done tomorrow. I don't see orders done but it's possible that that was the plan. If so I would discourage it. I think it would be better for her to stay in the hospital overnight so that we can do ECT again tomorrow before discharge. I think if we do not do that the chance that she will come back tomorrow morning to get ECT is relatively low. If she is not cooperative her husband by himself has a hard time making her do anything. Once we do ECT tomorrow I think it would be reasonable for her to be discharged and we will just have to try our  best to keep on top of getting her back in for maintenance treatments. No change to her medicine orders right now. I tried to talk about this with the patient but as part of her confusion I don't think she could really understand very well.  Disposition: Discussed crisis plan, support from social network, calling 911, coming to the Emergency Department, and calling Suicide Hotline.  Mordecai Rasmussen, MD 10/14/2015 5:40 PM

## 2015-10-14 NOTE — Progress Notes (Signed)
Dr Tobi BastosPyreddy notified unable to obtain IV access, care order placed in EPIC.

## 2015-10-15 ENCOUNTER — Inpatient Hospital Stay: Payer: Medicare Other | Admitting: Anesthesiology

## 2015-10-15 ENCOUNTER — Encounter: Payer: Self-pay | Admitting: Anesthesiology

## 2015-10-15 MED ORDER — LABETALOL HCL 5 MG/ML IV SOLN
INTRAVENOUS | Status: DC | PRN
  Administered 2015-10-15: 20 mg via INTRAVENOUS

## 2015-10-15 MED ORDER — METHOHEXITAL SODIUM 100 MG/10ML IV SOSY
PREFILLED_SYRINGE | INTRAVENOUS | Status: DC | PRN
  Administered 2015-10-15: 60 mg via INTRAVENOUS

## 2015-10-15 MED ORDER — FENTANYL CITRATE (PF) 100 MCG/2ML IJ SOLN: 25.0000 ug | INTRAMUSCULAR | Status: DC | PRN

## 2015-10-15 MED ORDER — SUCCINYLCHOLINE CHLORIDE 200 MG/10ML IV SOSY
PREFILLED_SYRINGE | INTRAVENOUS | Status: DC | PRN
  Administered 2015-10-15: 80 mg via INTRAVENOUS

## 2015-10-15 MED ORDER — SODIUM CHLORIDE 0.9 % IV SOLN
INTRAVENOUS | Status: DC | PRN
  Administered 2015-10-15: 10:00:00 via INTRAVENOUS

## 2015-10-15 MED ORDER — ONDANSETRON HCL 4 MG/2ML IJ SOLN: 4.0000 mg | Freq: Once | INTRAMUSCULAR | Status: DC | PRN

## 2015-10-15 NOTE — Anesthesia Procedure Notes (Signed)
Date/Time: 10/15/2015 10:14 AM Performed by: Lily KocherPERALTA, Breean Nannini Pre-anesthesia Checklist: Patient identified, Emergency Drugs available, Suction available and Patient being monitored Patient Re-evaluated:Patient Re-evaluated prior to inductionOxygen Delivery Method: Circle system utilized Preoxygenation: Pre-oxygenation with 100% oxygen Intubation Type: IV induction Ventilation: Mask ventilation without difficulty and Mask ventilation throughout procedure Airway Equipment and Method: Bite block Placement Confirmation: positive ETCO2 Dental Injury: Teeth and Oropharynx as per pre-operative assessment

## 2015-10-15 NOTE — Procedures (Signed)
ECT SERVICES Physician's Interval Evaluation & Treatment Note  Patient Identification: Jenna Dudley MRN:  [id number] Date of Evaluation:  10/15/2015 TX #: 4  MADRS:   MMSE:   P.E. Findings:  No change to physical exam. Lungs clear heart normal vital signs normal.  Psychiatric Interval Note:  Patient is calmer and more lucid but still confused with poor insight  Subjective:  [name] seen for evaluation for Electroconvulsive Therapy. Still complains of feeling bad  Treatment Summary:   [x]   Right Unilateral             []  Bilateral   % Energy : 0.3 ms 100%   Impedance: 1080 ohms  Seizure Energy Index: 1801 V squared  Postictal Suppression Index: 81%  Seizure Concordance Index: 95%  Medications  Pre Shock: Toradol 30 mg basal 20 mg Brevital 60 mg succinylcholine 80 mg  Post Shock:    Seizure Duration: 14 seconds by EMG 67 seconds by EEG   Comments: I would like to continue treatment in the next week scheduled for Monday although she may be outpatient.   Lungs:  [x]   Clear to auscultation               []  Other:   Heart:    [x]   Regular rhythm             []  irregular rhythm    [x]   Previous H&P reviewed, patient examined and there are NO CHANGES                 []   Previous H&P reviewed, patient examined and there are changes noted.   Jenna RasmussenJohn Clapacs, MD 7/21/201710:03 AM

## 2015-10-15 NOTE — Progress Notes (Signed)
Physical Therapy Treatment Patient Details Name: Jenna Dudley MRN: [id number] DOB: [birth year] Today's Date: 10/15/2015    History of Present Illness Pt is a 80 y/o female that presents with acute delerium, underlying UTI and non-compliance with home psychiatric medications. Started on precedex drip as she was combative, agitated initially.     PT Comments    Pt appears more alert today, oriented to place however still displays some confusion to situation. Pt asking to speak with MD anticipating d/c home as wants to see sister.  Pt able to follow one step commands consistently however would occasional lose focus while performing seated therex.  Pt ambulated 76f with HHA with occasional sway and narrow BOS with increased cadence requiring verbal cues which pt was able to appropriately respond to.  Pt had increased wheezing during ambulation however SpO2 remained >95% on RA with no c/o SOB.  Pt appropriately answered questions and left in chair with all current needs met.    Follow Up Recommendations  Home health PT     Equipment Recommendations       Recommendations for Other Services       Precautions / Restrictions Restrictions Weight Bearing Restrictions: No    Mobility  Bed Mobility Overal bed mobility: Modified Independent Bed Mobility: Supine to Sit     Supine to sit: HOB elevated     General bed mobility comments: Pt demonstated improved safety with removal of sheets and performed supine to sit transfer   Transfers Overall transfer level: Needs assistance Equipment used: None Transfers: Sit to/from Stand Sit to Stand: Supervision         General transfer comment: Decreased safety awarenss, some impulsiveness noted  Ambulation/Gait Ambulation/Gait assistance: Min guard Ambulation Distance (Feet): 90 Feet Assistive device: None Gait Pattern/deviations: Step-to pattern;Step-through pattern;Narrow base of support     General Gait Details: Pt with uneven step  length with general step through with occasional step to pattern,> Demonstrated occasional drift to L or R however no LOB noted.    Stairs            Wheelchair Mobility    Modified Rankin (Stroke Patients Only)       Balance Overall balance assessment: Needs assistance Sitting-balance support: Feet supported Sitting balance-Leahy Scale: Good     Standing balance support: No upper extremity supported Standing balance-Leahy Scale: Fair Standing balance comment: Some lateral sway noted however no LOB during session                     Cognition Arousal/Alertness: Awake/alert Behavior During Therapy: WFL for tasks assessed/performed Overall Cognitive Status: Impaired/Different from baseline Area of Impairment: Orientation Orientation Level: Disoriented to;Situation Current Attention Level: Alternating Memory: Decreased short-term memory Following Commands: Follows one step commands consistently Safety/Judgement: Decreased awareness of safety          Exercises Other Exercises Other Exercises: LAQ x15, hip flexion/march x10, hip abd/add x 10 bilaterally     General Comments        Pertinent Vitals/Pain Pain Assessment: No/denies pain    Home Living                      Prior Function            PT Goals (current goals can now be found in the care plan section) Progress towards PT goals: Progressing toward goals    Frequency  Min 2X/week    PT Plan Current plan remains appropriate  Co-evaluation             End of Session   Activity Tolerance: Patient tolerated treatment well Patient left: in chair;with call bell/phone within reach;with chair alarm set;with family/visitor present     Time: 1445-1500 PT Time Calculation (min) (ACUTE ONLY): 15 min  Charges:  $Therapeutic Activity: 8-22 mins                    G Codes:      Araeya Lamb 10-24-2015, 3:31 PM  Adolfo Granieri, PTA

## 2015-10-15 NOTE — Progress Notes (Signed)
[facility] - [place] [facility]   PATIENT NAME: Jenna Dudley    MRN#:  [id number]  DATE OF BIRTH:  1935-11-03  SUBJECTIVE:  Hospital Day: 12 days Jenna Dudley is a 80 y.o. female presenting with Altered Mental Status .   Overnight events: No overnight events Interval Events: Sleeping easily arousable, confused when awoke  REVIEW OF SYSTEMS:  CONSTITUTIONAL: No fever, fatigue or weakness.  EYES: No blurred or double vision.  EARS, NOSE, AND THROAT: No tinnitus or ear pain.  RESPIRATORY: No cough, shortness of breath, wheezing or hemoptysis.  CARDIOVASCULAR: No chest pain, orthopnea, edema.  GASTROINTESTINAL: No nausea, vomiting, diarrhea or abdominal pain.  GENITOURINARY: No dysuria, hematuria.  ENDOCRINE: No polyuria, nocturia,  HEMATOLOGY: No anemia, easy bruising or bleeding SKIN: No rash or lesion. MUSCULOSKELETAL: No joint pain or arthritis.   NEUROLOGIC: No tingling, numbness, weakness.  PSYCHIATRY: Denies anxiety or depression.   DRUG ALLERGIES:   Allergies  Allergen Reactions  . Asa [Aspirin] Other (See Comments)    Unknown reaction  . Sulfa Antibiotics Other (See Comments)    Unknown reaction    VITALS:  Blood pressure 153/65, pulse 74, temperature 98.3 F (36.8 C), temperature source Oral, resp. rate 20, height  (1.575 m), weight 154 lb (69.854 kg), SpO2 100 %.  PHYSICAL EXAMINATION:  VITAL SIGNS: Filed Vitals:   10/15/15 1105 10/15/15 1123  BP: 156/67 153/65  Pulse: 74 74  Temp:  98.3 F (36.8 C)  Resp: 27 20   GENERAL:80 y.o.female currently in no acute distress.  HEAD: Normocephalic, atraumatic.  EYES: Pupils equal, round, reactive to light. Extraocular muscles intact. No scleral icterus.  MOUTH: Moist mucosal membrane. Dentition intact. No abscess noted.  EAR, NOSE, THROAT: Clear without exudates. No external lesions.  NECK: Supple. No thyromegaly. No nodules. No JVD.  PULMONARY: Clear to ascultation, without  wheeze rails or rhonci. No use of accessory muscles, Good respiratory effort. good air entry bilaterally CHEST: Nontender to palpation.  CARDIOVASCULAR: S1 and S2. Regular rate and rhythm. No murmurs, rubs, or gallops. No edema. Pedal pulses 2+ bilaterally.  GASTROINTESTINAL: Soft, nontender, nondistended. No masses. Positive bowel sounds. No hepatosplenomegaly.  MUSCULOSKELETAL: No swelling, clubbing, or edema. Range of motion full in all extremities.  NEUROLOGIC: Cranial nerves II through XII are intact. No gross focal neurological deficits. Sensation intact. Reflexes intact.  SKIN: No ulceration, lesions, rashes, or cyanosis. Skin warm and dry. Turgor intact.  PSYCHIATRIC: Mood, affect flat. Sleeping easily arousable Insight, judgment poor.      LABORATORY PANEL:   CBC  Recent Labs Lab 10/10/15 0650  WBC 6.7  HGB 11.0*  HCT 31.9*  PLT 177   ------------------------------------------------------------------------------------------------------------------  Chemistries   Recent Labs Lab 10/12/15 1223  NA 138  K 4.5  CL 110  CO2 22  GLUCOSE 95  BUN 8  CREATININE 0.58  CALCIUM 8.4*   ------------------------------------------------------------------------------------------------------------------  Cardiac Enzymes No results for input(s): TROPONINI in the last 168 hours. ------------------------------------------------------------------------------------------------------------------  RADIOLOGY:  No results found.  EKG:   Orders placed or performed during the hospital encounter of 10/03/15  . EKG 12-Lead  . EKG 12-Lead  . EKG 12-Lead  . EKG 12-Lead    ASSESSMENT AND PLAN:   Jenna Dudley is a 80 y.o. female presenting with Altered Mental Status . Admitted 10/03/2015 : Day #: 12 days #1 Escherichia coli UTI-blood cultures are negative. Urine cultures with Escherichia coli. -Received treatment with Rocephin. Finished IV antibiotics.  #2 acute renal  failure-secondary to ATN from infection. Resolved with IV fluids. -continue fluids- changed to NS- until able to have good oral intake.  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 -Patient on oral Seroquel, Remeron and Zoloft. - Changed to IM zyprexa -ECT today  #4 DVT prophylaxis-on Lovenox.  #5 Hypokalemia- resolved   All the records are reviewed and case discussed with Care Management/Social Workerr. Management plans discussed with the patient, family and they are in agreement.  CODE STATUS: full TOTAL TIME TAKING CARE OF THIS PATIENT: 28 minutes.   POSSIBLE D/C IN 2-3DAYS, DEPENDING ON CLINICAL CONDITION.   [name],  Mardi MainlandDavid K M.D on 10/15/2015 at 11:25 AM  Between 7am to 6pm - Pager - [phone]  After 6pm: House Pager: - [phone]  [facility]  Office  [phone]  CC: Primary care physician; Toya SmothersADEROJU, ELIZABETH OYEYEMI, MD

## 2015-10-15 NOTE — Clinical Documentation Improvement (Deleted)
Internal Medicine  Can the diagnosis of Sepsis be further specified  ?  Thank you   Status: sepsis ruled in, sepsis is resolving, sepsis ruled out  Other  Clinically Undetermined   Supporting Information: on adm WBC 11.5/ LA 2.88, blood cx's neg, urine pos  E coli >100, 000 colonies, temp 100.5 on admit, pulse ( 101, 95, 92) Resp (28, 27, 26 )     Please exercise your independent, professional judgment when responding. A specific answer is not anticipated or expected.   Thank You,  Lavonda JumboLawanda J Whyatt [facility] [place] [phone]

## 2015-10-15 NOTE — Anesthesia Preprocedure Evaluation (Signed)
Anesthesia Evaluation  Patient identified by MRN, date of birth, ID band Patient awake    Reviewed: Allergy & Precautions, NPO status , Patient's Chart, lab work & pertinent test results, reviewed documented beta blocker date and time   Airway Mallampati: II  TM Distance: >3 FB     Dental  (+) Chipped   Pulmonary former smoker,           Cardiovascular hypertension, Pt. on medications      Neuro/Psych PSYCHIATRIC DISORDERS Depression  Neuromuscular disease    GI/Hepatic GERD  Controlled,  Endo/Other    Renal/GU Renal InsufficiencyRenal disease     Musculoskeletal  (+) Arthritis ,   Abdominal   Peds  Hematology   Anesthesia Other Findings   Reproductive/Obstetrics                             Anesthesia Physical Anesthesia Plan  ASA: III  Anesthesia Plan: General   Post-op Pain Management:    Induction: Intravenous  Airway Management Planned: Mask  Additional Equipment:   Intra-op Plan:   Post-operative Plan:   Informed Consent: I have reviewed the patients History and Physical, chart, labs and discussed the procedure including the risks, benefits and alternatives for the proposed anesthesia with the patient or authorized representative who has indicated his/her understanding and acceptance.     Plan Discussed with: CRNA  Anesthesia Plan Comments:         Anesthesia Quick Evaluation  

## 2015-10-15 NOTE — H&P (Signed)
Jenna Dudley is an 80 y.o. female.   Chief Complaint: Patient with recurrent depression as part of bipolar disorder with depression confusion and delirium HPI: Still very confused poor insight  Past Medical History  Diagnosis Date  . Hypertension   . Hyperlipemia 07/29/14  . Chronic low back pain 07/29/14  . Spinal stenosis 07/29/14  . GERD (gastroesophageal reflux disease)   . Depression   . Esophageal spasm 07/29/14  . Bilateral carpal tunnel syndrome 07/27/14  . Degenerative arthritis of lumbar spine 07/27/14    Past Surgical History  Procedure Laterality Date  . Hip surgery Right   . Replacement total knee bilateral Bilateral 07/27/14  . Inner ear surgery Right     Family History  Problem Relation Age of Onset  . Family history unknown: Yes   Social History:  reports that she quit smoking about 40 years ago. Her smoking use included Cigarettes. She has never used smokeless tobacco. She reports that she does not drink alcohol or use illicit drugs.  Allergies:  Allergies  Allergen Reactions  . Asa [Aspirin] Other (See Comments)    Unknown reaction  . Sulfa Antibiotics Other (See Comments)    Unknown reaction    Medications Prior to Admission  Medication Sig Dispense Refill  . acetaminophen-codeine (TYLENOL #3) 300-30 MG per tablet Take 1 tablet by mouth every 8 (eight) hours as needed for moderate pain or severe pain.    . hydrOXYzine (ATARAX/VISTARIL) 50 MG tablet Take 1 tablet (50 mg total) by mouth 3 (three) times daily as needed for anxiety. 30 tablet 0  . losartan (COZAAR) 25 MG tablet Take 1 tablet (25 mg total) by mouth daily. 30 tablet 0  . mirtazapine (REMERON) 45 MG tablet Take 1 tablet (45 mg total) by mouth at bedtime. 30 tablet 6  . OLANZapine (ZYPREXA) 20 MG tablet Take 1 tablet (20 mg total) by mouth at bedtime. 30 tablet 6  . omeprazole (PRILOSEC) 20 MG capsule Take 20 mg by mouth 2 (two) times daily before a meal.     . potassium chloride SA (K-DUR,KLOR-CON) 20  MEQ tablet Take 20 mEq by mouth daily.    . QUEtiapine (SEROQUEL) 300 MG tablet Take 1 tablet (300 mg total) by mouth at bedtime. 30 tablet 6  . sertraline (ZOLOFT) 100 MG tablet Take 2 tablets (200 mg total) by mouth daily. 2 tabs in the am (Patient taking differently: Take 200 mg by mouth every morning. ) 180 tablet 6  . simvastatin (ZOCOR) 40 MG tablet Take 40 mg by mouth at bedtime.       No results found for this or any previous visit (from the past 48 hour(s)). No results found.  Review of Systems  Constitutional: Negative.   HENT: Negative.   Eyes: Negative.   Respiratory: Negative.   Cardiovascular: Negative.   Gastrointestinal: Negative.   Musculoskeletal: Positive for back pain.  Skin: Negative.   Neurological: Negative.   Psychiatric/Behavioral: Positive for depression and memory loss. Negative for suicidal ideas, hallucinations and substance abuse. The patient is nervous/anxious and has insomnia.     Blood pressure 154/53, pulse 85, temperature 98.2 F (36.8 C), temperature source Oral, resp. rate 18, height  (1.575 m), weight 69.854 kg (154 lb), SpO2 95 %. Physical Exam  Nursing note and vitals reviewed. Constitutional: She appears well-developed and well-nourished.  HENT:  Head: Normocephalic and atraumatic.  Eyes: Conjunctivae are normal. Pupils are equal, round, and reactive to light.  Neck: Normal range of  motion.  Cardiovascular: Regular rhythm and normal heart sounds.   Respiratory: Effort normal. No respiratory distress.  GI: Soft.  Musculoskeletal: Normal range of motion.  Neurological: She is alert.  Skin: Skin is warm and dry.  Psychiatric: Her affect is labile. Her speech is delayed. She is slowed and withdrawn. Thought content is paranoid and delusional. Cognition and memory are impaired. She expresses inappropriate judgment. She exhibits a depressed mood. She is noncommunicative.     Assessment/Plan Treatment today reassess and possible  discharge soon depending on functionality.  Mordecai RasmussenJohn Clapacs, MD 10/15/2015, 10:01 AM

## 2015-10-15 NOTE — Progress Notes (Signed)
Pt has ECT Tx this AM and MD requested IV access. Previous shift tried 2 times and the supervisor this shift tried 3 times with no success.

## 2015-10-15 NOTE — Transfer of Care (Signed)
Immediate Anesthesia Transfer of Care Note  Patient: Jenna DaftJoan M Zubiate  Procedure(s) Performed: ECT  Patient Location: PACU  Anesthesia Type:General  Level of Consciousness: sedated  Airway & Oxygen Therapy: Patient Spontanous Breathing and Patient connected to face mask oxygen  Post-op Assessment: Report given to RN  Post vital signs: Reviewed  Last Vitals:  Filed Vitals:   10/15/15 0958 10/15/15 1025  BP:  154/72  Pulse: 85 78  Temp:  35.8 C  Resp:  28    Last Pain:  Filed Vitals:   10/15/15 1030  PainSc: 0-No pain      Patients Stated Pain Goal: 0 (10/13/15 2200)  Complications: No apparent anesthesia complications

## 2015-10-15 NOTE — Care Management (Signed)
ECT treatment # 4 today.  PT to reassess today.

## 2015-10-15 NOTE — Care Management Important Message (Signed)
Important Message  Patient Details  Name: Jenna DaftJoan M Spark MRN: [id number] Date of Birth: [birth year]   Medicare Important Message Given:  Yes    Chapman FitchBOWEN, Menachem Urbanek T, RN 10/15/2015, 10:48 AM

## 2015-10-15 NOTE — Anesthesia Postprocedure Evaluation (Signed)
Anesthesia Post Note  Patient: Galen DaftJoan M Enrico  Procedure(s) Performed: * No procedures listed *  Patient location during evaluation: PACU Anesthesia Type: General Level of consciousness: awake and alert Pain management: pain level controlled Vital Signs Assessment: post-procedure vital signs reviewed and stable Respiratory status: spontaneous breathing, nonlabored ventilation, respiratory function stable and patient connected to nasal cannula oxygen Cardiovascular status: blood pressure returned to baseline and stable Postop Assessment: no signs of nausea or vomiting Anesthetic complications: no    Last Vitals:  Filed Vitals:   10/15/15 1105 10/15/15 1123  BP: 156/67 153/65  Pulse: 74 74  Temp:  36.8 C  Resp: 27 20    Last Pain:  Filed Vitals:   10/15/15 1123  PainSc: 0-No pain                 Maloree Uplinger S

## 2015-10-15 NOTE — Progress Notes (Signed)
Pt A and O x 4. VSS. Pt tolerating diet well. No complaints of pain or nausea. IV removed intact, no new prescriptions given. Pt voiced understanding of discharge instructions along with husband, with no further questions. Pt discharged via wheelchair with nurse aide.

## 2015-10-15 NOTE — Consult Note (Signed)
[facility]   Reason for Consult:  Follow-up consult for this 80 year old woman with what now appears to be bipolar disorder. We have been treating her with ECT while she has remained in the hospital. Referring Physician:  Nemiah CommanderKalisetti Patient Identification: Jenna Dudley MRN:  [id number] Principal Diagnosis: Acute encephalopathy Diagnosis:   Patient Active Problem List   Diagnosis Date Noted  . Acute encephalopathy [G93.40] 10/04/2015  . Sepsis (HCC) [A41.9] 10/03/2015  . Acute pyelonephritis [N10] 10/03/2015  . Hypernatremia [E87.0] 10/03/2015  . Lactic acidosis [E87.2] 10/03/2015  . Acute renal insufficiency [N28.9] 10/03/2015  . Leukocytosis [D72.829] 10/03/2015  . Agitation [R45.1] 10/03/2015  . Severe recurrent major depression without psychotic features (HCC) [F33.2]   . Major depressive disorder, recurrent episode, severe, with psychotic behavior (HCC) [F33.3] 10/13/2014  . Severe episode of recurrent major depressive disorder, with psychotic features (HCC) [F33.3] 10/13/2014  . Major depressive disorder, 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 features (HCC) [F33.2] 10/06/2014  . Infection of urinary tract [N39.0] 09/17/2014  . Essential hypertension [I10] 09/17/2014  . Chronic back pain [M54.9, G89.29] 09/17/2014  . Gastric reflux [K21.9] 09/17/2014  . History of stroke [Z86.73] 09/17/2014  . Hypertension [I10] 09/16/2014  . Severe recurrent major depression with psychotic features (HCC) [F33.3]   . 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 Hammer toe [M20.40] 09/12/2013  . Foot pain [M79.673] 09/12/2013  . Fungal infection of nail [B35.1] 09/12/2013  . Carpal tunnel syndrome [G56.00] 12/21/2010  . Chronic LBP [M54.5, G89.29] 12/21/2010  . Degenerative arthritis of lumbar spine [M47.816] 12/21/2010  . Clinical depression [F32.9] 12/21/2010  . 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 HLD (hyperlipidemia) [E78.5] 12/21/2010  . BP  (high blood pressure) [I10] 12/21/2010  . Acid reflux [K21.9] 12/21/2010  . Spinal stenosis [M48.00] 12/21/2010    Total Time spent with patient: 35 minutes  Subjective:   Jenna DaftJoan M Dudley is a 80 y.o. female patient admitted with "thank you for helping me Dr. Mat Carnelay packs.".  HPI:  Patient seen. Spoke with her husband. Patient had ECT this morning. Prior to her ECT treatment she was much better than she was yesterday but was still irritable and complaining of pretty much everything. Anytime he touched her she would accuse U of torturing her. That sort of thing. This afternoon she is dramatically different. Patient was calm and polite. Not aggressive. Showed improved insight. Thanked me for providing psychiatric treatment. Husband reports she has been like this ever since her ECT treatment. She has been able to get up and ambulate around the ward without difficulty. Seems to be back to her baseline.  Past Psychiatric History: Long-standing mood disorder with multiple episodes of decompensation into psychosis. This current episode was the most manic I have ever seen her. Previously as she is been very depressed. This seems to been more of a mixed episode. Has a history of more than one inpatient hospitalization. Has a history of doing extremely well when she was keeping up with maintenance ECT.  Risk to Self: Is patient at risk for suicide?: No Risk to Others:   Prior Inpatient Therapy:   Prior Outpatient Therapy:    Past Medical History:  Past Medical History  Diagnosis Date  . Hypertension   . Hyperlipemia 07/29/14  . Chronic low back pain 07/29/14  . Spinal stenosis 07/29/14  . GERD (gastroesophageal reflux disease)   . Depression   . Esophageal spasm 07/29/14  . Bilateral carpal tunnel syndrome 07/27/14  . Degenerative arthritis of lumbar  spine 07/27/14    Past Surgical History  Procedure Laterality Date  . Hip surgery Right   . Replacement total knee bilateral Bilateral 07/27/14  . Inner ear  surgery Right    Family History:  Family History  Problem Relation Age of Onset  . Family history unknown: Yes   Family Psychiatric  History: Positive for mood disorders Social History:  History  Alcohol Use No     History  Drug Use No    Social History   Social History  . Marital Status: Married    Spouse Name: N/A  . Number of Children: N/A  . Years of Education: N/A   Social History Main Topics  . Smoking status: Former Smoker    Types: Cigarettes    Quit date: 07/29/1975  . Smokeless tobacco: Never Used  . Alcohol Use: No  . Drug Use: No  . Sexual Activity: No     Comment: postmenopause   Other Topics Concern  . None   Social History Narrative   Additional Social History:    Allergies:   Allergies  Allergen Reactions  . Asa [Aspirin] Other (See Comments)    Unknown reaction  . Sulfa Antibiotics Other (See Comments)    Unknown reaction    Labs: No results found for this or any previous visit (from the past 48 hour(s)).  Current Facility-Administered Medications  Medication Dose Route Frequency Provider Last Rate Last Dose  . acetaminophen (TYLENOL) tablet 650 mg  650 mg Oral Q6H PRN Katharina Caper, MD       Or  . acetaminophen (TYLENOL) suppository 650 mg  650 mg Rectal Q6H PRN Katharina Caper, MD   650 mg at 10/08/15 0312  . acetaminophen-codeine (TYLENOL #3) 300-30 MG per tablet 1 tablet  1 tablet Oral Q8H PRN Katharina Caper, MD   1 tablet at 10/04/15 0812  . docusate sodium (COLACE) capsule 100 mg  100 mg Oral BID Katharina Caper, MD   100 mg at 10/15/15 1141  . enoxaparin (LOVENOX) injection 40 mg  40 mg Subcutaneous Q24H Katharina Caper, MD   40 mg at 10/13/15 2309  . feeding supplement (ENSURE ENLIVE) (ENSURE ENLIVE) liquid 237 mL  237 mL Oral BID BM Enid Baas, MD   237 mL at 10/15/15 1346  . haloperidol lactate (HALDOL) injection 5 mg  5 mg Intravenous Q2H PRN Audery Amel, MD      . losartan (COZAAR) tablet 25 mg  25 mg Oral Daily Enid Baas, MD   25 mg at 10/15/15 1141  . mirtazapine (REMERON) tablet 45 mg  45 mg Oral QHS Katharina Caper, MD   45 mg at 10/14/15 2145  . nystatin (MYCOSTATIN/NYSTOP) topical powder   Topical TID Eugenie Norrie, NP      . OLANZapine (ZYPREXA) injection 15 mg  15 mg Intramuscular QHS Audery Amel, MD   15 mg at 10/13/15 2309  . ondansetron (ZOFRAN) tablet 4 mg  4 mg Oral Q6H PRN Katharina Caper, MD       Or  . ondansetron (ZOFRAN) injection 4 mg  4 mg Intravenous Q6H PRN Katharina Caper, MD      . pantoprazole (PROTONIX) EC tablet 40 mg  40 mg Oral Daily Katharina Caper, MD   40 mg at 10/15/15 1141  . QUEtiapine (SEROQUEL) tablet 300 mg  300 mg Oral QHS Katharina Caper, MD   300 mg at 10/14/15 2146  . sertraline (ZOLOFT) tablet 200 mg  200 mg Oral BH-q7a Rima  Vaickute, MD   200 mg at 10/14/15 1051  . simvastatin (ZOCOR) tablet 40 mg  40 mg Oral QHS Katharina Caper, MD   40 mg at 10/14/15 2145  . sodium chloride flush (NS) 0.9 % injection 3 mL  3 mL Intravenous Q12H Katharina Caper, MD   Stopped at 10/13/15 1000  . ziprasidone (GEODON) injection 10 mg  10 mg Intramuscular Q12H PRN Katharina Caper, MD   10 mg at 10/15/15 0908   Facility-Administered Medications Ordered in Other Encounters  Medication Dose Route Frequency Provider Last Rate Last Dose  . labetalol (NORMODYNE,TRANDATE) injection 20 mg  20 mg Intravenous Once Audery Amel, MD      . labetalol (NORMODYNE,TRANDATE) injection 20 mg  20 mg Intravenous Once Audery Amel, MD      . lactated ringers infusion   Intravenous Continuous Audery Amel, MD      . methohexital Sodium 60 mg  60 mg Intravenous Once Audery Amel, MD      . methohexital Sodium 60 mg  60 mg Intravenous Once Audery Amel, MD      . succinylcholine (ANECTINE) injection 80 mg  80 mg Intravenous Once Audery Amel, MD      . succinylcholine (ANECTINE) injection 80 mg  80 mg Intravenous Once Audery Amel, MD        Musculoskeletal: Strength & Muscle Tone:  decreased Gait & Station: unsteady Patient leans: N/A  Psychiatric Specialty Exam: Physical Exam  Constitutional: She appears well-developed and well-nourished.  HENT:  Head: Normocephalic and atraumatic.  Eyes: Conjunctivae are normal. Pupils are equal, round, and reactive to light.  Neck: Normal range of motion.  Cardiovascular: Regular rhythm and normal heart sounds.   Respiratory: Effort normal. No respiratory distress.  GI: Soft.  Musculoskeletal: Normal range of motion.  Neurological: She is alert. A cranial nerve deficit is present.  Skin: Skin is warm and dry.  Psychiatric: She has a normal mood and affect. Her speech is normal and behavior is normal. Judgment and thought content normal. She exhibits abnormal recent memory.    Review of Systems  Constitutional: Negative.   HENT: Negative.   Eyes: Negative.   Respiratory: Negative.   Cardiovascular: Negative.   Gastrointestinal: Negative.   Musculoskeletal: Positive for back pain.  Skin: Negative.   Neurological: Negative.   Psychiatric/Behavioral: Positive for memory loss. Negative for depression, suicidal ideas, hallucinations and substance abuse. The patient is not nervous/anxious and does not have insomnia.     Blood pressure 153/65, pulse 81, temperature 98.3 F (36.8 C), temperature source Oral, resp. rate 20, height 5\' 2"  (1.575 m), weight 69.854 kg (154 lb), SpO2 99 %.Body mass index is 28.16 kg/(m^2).  General Appearance: Fairly Groomed  Eye Contact:  Good  Speech:  Slow  Volume:  Normal  Mood:  Euthymic  Affect:  Constricted  Thought Process:  Goal Directed  Orientation:  Full (Time, Place, and Person)  Thought Content:  Logical and Rumination  Suicidal Thoughts:  No  Homicidal Thoughts:  No  Memory:  Immediate;   Good Recent;   Fair Remote;   Fair  Judgement:  Fair  Insight:  Fair  Psychomotor Activity:  Decreased  Concentration:  Concentration: Fair  Recall:  [employer] of Knowledge:  Fair   Language:  Fair  Akathisia:  No  Handed:  Right  AIMS (if indicated):     Assets:  Desire for Improvement Housing Leisure Time Resilience Social Support  ADL's:  Intact  Cognition:  WNL  Sleep:        Treatment Plan Summary: Medication management and Plan 80 year old woman who has been receiving ECT. Today was her fourth treatment in a row in this cycle. She has also continued on her medication including antidepressants and antipsychotics. Over the last 2 days she has had a clear improvement in today she appears to be pretty much back to her baseline. Knowing how much she prefers not to have the treatments I have agreed with her that given her improvement we will cancel Mondays treatment. I would like to see her back next Friday. Patient and husband are agreeable. We will put her on the schedule for July 28. Case reviewed with ECT team. Continue current antipsychotics and antidepressants. Patient and her husband know they can get in touch with me in the office if problems arise. I reviewed situation with hospitalist such the patient can be discharged today.  Disposition: Supportive therapy provided about ongoing stressors. Discussed crisis plan, support from social network, calling 911, coming to the Emergency Department, and calling Suicide Hotline.  Mordecai Rasmussen, MD 10/15/2015 4:12 PM

## 2015-10-15 NOTE — Discharge Summary (Addendum)
[facility] [place] at [facility]   PATIENT NAME: Jenna Dudley    MR#:  [id number]  DATE OF BIRTH:  1935-11-26  DATE OF ADMISSION:  10/03/2015 ADMITTING PHYSICIAN: Katharina Caper, MD  DATE OF DISCHARGE: 10/15/2015  PRIMARY CARE PHYSICIAN: Toya Smothers, MD    ADMISSION DIAGNOSIS:  Brief psychotic disorder [F23] Renal insufficiency [N28.9] UTI  DISCHARGE DIAGNOSIS:  Principal Problem:   Acute encephalopathy Active Problems:   Major depressive disorder, recurrent episode, severe, with psychotic behavior (HCC) Acute kidney injury  -resolved Ecoli UTI - resolved   SECONDARY DIAGNOSIS:   Past Medical History  Diagnosis Date  . Hypertension   . Hyperlipemia 07/29/14  . Chronic low back pain 07/29/14  . Spinal stenosis 07/29/14  . GERD (gastroesophageal reflux disease)   . Depression   . Esophageal spasm 07/29/14  . Bilateral carpal tunnel syndrome 07/27/14  . Degenerative arthritis of lumbar spine 07/27/14    HOSPITAL COURSE:  Jenna Dudley  is a 80 y.o. female admitted 10/03/2015 with chief complaint Altered Mental Status . Please see H&P performed by Katharina Caper, MD for further information. Patient presented with the above complaints. She was evaluated by psychiatry who assisted in changing her medications and restarting her ECT treatments. In total she received 4 ECT treatments with return of baseline mental status/functionality.   DISCHARGE CONDITIONS:   stable  CONSULTS OBTAINED:  Treatment Team:  Shari Prows, MD Audery Amel, MD  DRUG ALLERGIES:   Allergies  Allergen Reactions  . Asa [Aspirin] Other (See Comments)    Unknown reaction  . Sulfa Antibiotics Other (See Comments)    Unknown reaction    DISCHARGE MEDICATIONS:   Current Discharge Medication List    CONTINUE these medications which have NOT CHANGED   Details  acetaminophen-codeine (TYLENOL #3) 300-30 MG per tablet Take 1 tablet by mouth every 8 (eight) hours  as needed for moderate pain or severe pain.    hydrOXYzine (ATARAX/VISTARIL) 50 MG tablet Take 1 tablet (50 mg total) by mouth 3 (three) times daily as needed for anxiety. Qty: 30 tablet, Refills: 0    losartan (COZAAR) 25 MG tablet Take 1 tablet (25 mg total) by mouth daily. Qty: 30 tablet, Refills: 0    mirtazapine (REMERON) 45 MG tablet Take 1 tablet (45 mg total) by mouth at bedtime. Qty: 30 tablet, Refills: 6    OLANZapine (ZYPREXA) 20 MG tablet Take 1 tablet (20 mg total) by mouth at bedtime. Qty: 30 tablet, Refills: 6    omeprazole (PRILOSEC) 20 MG capsule Take 20 mg by mouth 2 (two) times daily before a meal.     potassium chloride SA (K-DUR,KLOR-CON) 20 MEQ tablet Take 20 mEq by mouth daily.    QUEtiapine (SEROQUEL) 300 MG tablet Take 1 tablet (300 mg total) by mouth at bedtime. Qty: 30 tablet, Refills: 6    sertraline (ZOLOFT) 100 MG tablet Take 2 tablets (200 mg total) by mouth daily. 2 tabs in the am Qty: 180 tablet, Refills: 6    simvastatin (ZOCOR) 40 MG tablet Take 40 mg by mouth at bedtime.          DISCHARGE INSTRUCTIONS:    DIET:  Regular diet  DISCHARGE CONDITION:  Stable  ACTIVITY:  Activity as tolerated  OXYGEN:  Home Oxygen: No.   Oxygen Delivery: room air  DISCHARGE LOCATION:  home   If you experience worsening of your admission symptoms, develop shortness of breath, life threatening emergency, suicidal or homicidal  thoughts you must seek medical attention immediately by calling 911 or calling your MD immediately  if symptoms less severe.  You Must read complete instructions/literature along with all the possible adverse reactions/side effects for all the Medicines you take and that have been prescribed to you. Take any new Medicines after you have completely understood and accpet all the possible adverse reactions/side effects.   Please note  You were cared for by a hospitalist during your hospital stay. If you have any questions about  your discharge medications or the care you received while you were in the hospital after you are discharged, you can call the unit and asked to speak with the hospitalist on call if the hospitalist that took care of you is not available. Once you are discharged, your primary care physician will handle any further medical issues. Please note that NO REFILLS for any discharge medications will be authorized once you are discharged, as it is imperative that you return to your primary care physician (or establish a relationship with a primary care physician if you do not have one) for your aftercare needs so that they can reassess your need for medications and monitor your lab values.    On the day of Discharge:   VITAL SIGNS:  Blood pressure 153/65, pulse 81, temperature 98.3 F (36.8 C), temperature source Oral, resp. rate 20, height 5\' 2"  (1.575 m), weight 154 lb (69.854 kg), SpO2 99 %.  I/O:   Intake/Output Summary (Last 24 hours) at 10/15/15 1536 Last data filed at 10/15/15 1207  Gross per 24 hour  Intake    480 ml  Output      0 ml  Net    480 ml    PHYSICAL EXAMINATION:  GENERAL:  80 y.o.-year-old patient lying in the bed with no acute distress.  EYES: Pupils equal, round, reactive to light and accommodation. No scleral icterus. Extraocular muscles intact.  HEENT: Head atraumatic, normocephalic. Oropharynx and nasopharynx clear.  NECK:  Supple, no jugular venous distention. No thyroid enlargement, no tenderness.  LUNGS: Normal breath sounds bilaterally, no wheezing, rales,rhonchi or crepitation. No use of accessory muscles of respiration.  CARDIOVASCULAR: S1, S2 normal. No murmurs, rubs, or gallops.  ABDOMEN: Soft, non-tender, non-distended. Bowel sounds present. No organomegaly or mass.  EXTREMITIES: No pedal edema, cyanosis, or clubbing.  NEUROLOGIC: Cranial nerves II through XII are intact. Muscle strength 5/5 in all extremities. Sensation intact. Gait not checked.  PSYCHIATRIC:  The patient is alert and oriented x 3.  SKIN: No obvious rash, lesion, or ulcer.   DATA REVIEW:   CBC  Recent Labs Lab 10/10/15 0650  WBC 6.7  HGB 11.0*  HCT 31.9*  PLT 177    Chemistries   Recent Labs Lab 10/12/15 1223  NA 138  K 4.5  CL 110  CO2 22  GLUCOSE 95  BUN 8  CREATININE 0.58  CALCIUM 8.4*    Cardiac Enzymes No results for input(s): TROPONINI in the last 168 hours.  Microbiology Results  Results for orders placed or performed during the hospital encounter of 10/03/15  Urine culture     Status: Abnormal   Collection Time: 10/03/15  9:37 AM  Result Value Ref Range Status   Specimen Description URINE, CLEAN CATCH  Final   Special Requests NONE  Final   Culture >=100,000 COLONIES/mL ESCHERICHIA COLI (A)  Final   Report Status 10/05/2015 FINAL  Final   Organism ID, Bacteria ESCHERICHIA COLI (A)  Final  Susceptibility   Escherichia coli - MIC*    AMPICILLIN <=2 SENSITIVE Sensitive     CEFAZOLIN <=4 SENSITIVE Sensitive     CEFTRIAXONE <=1 SENSITIVE Sensitive     CIPROFLOXACIN <=0.25 SENSITIVE Sensitive     GENTAMICIN <=1 SENSITIVE Sensitive     IMIPENEM <=0.25 SENSITIVE Sensitive     NITROFURANTOIN <=16 SENSITIVE Sensitive     TRIMETH/SULFA <=20 SENSITIVE Sensitive     AMPICILLIN/SULBACTAM <=2 SENSITIVE Sensitive     PIP/TAZO <=4 SENSITIVE Sensitive     Extended ESBL NEGATIVE Sensitive     * >=100,000 COLONIES/mL ESCHERICHIA COLI  Culture, blood (Routine X 2) w Reflex to ID Panel     Status: None   Collection Time: 10/03/15 11:08 AM  Result Value Ref Range Status   Specimen Description BLOOD RIGHT ANTECUBITAL  Final   Special Requests BOTTLES DRAWN AEROBIC AND ANAEROBIC  3CC  Final   Culture NO GROWTH 5 DAYS  Final   Report Status 10/08/2015 FINAL  Final  Culture, blood (Routine X 2) w Reflex to ID Panel     Status: None   Collection Time: 10/03/15 11:08 AM  Result Value Ref Range Status   Specimen Description BLOOD LEFT HAND  Final    Special Requests BOTTLES DRAWN AEROBIC AND ANAEROBIC  1CC  Final   Culture NO GROWTH 5 DAYS  Final   Report Status 10/08/2015 FINAL  Final  MRSA PCR Screening     Status: None   Collection Time: 10/03/15  1:41 PM  Result Value Ref Range Status   MRSA by PCR NEGATIVE NEGATIVE Final    Comment:        The GeneXpert MRSA Assay (FDA approved for NASAL specimens only), is one component of a comprehensive MRSA colonization surveillance program. It is not intended to diagnose MRSA infection nor to guide or monitor treatment for MRSA infections.   C difficile quick scan w PCR reflex     Status: None   Collection Time: 10/10/15 12:11 PM  Result Value Ref Range Status   C Diff antigen NEGATIVE NEGATIVE Final   C Diff toxin NEGATIVE NEGATIVE Final   C Diff interpretation No C. difficile detected.  Final    RADIOLOGY:  No results found.   Management plans discussed with the patient, family and they are in agreement.  CODE STATUS:     Code Status Orders        Start     Ordered   10/03/15 1341  Full code   Continuous     10/03/15 1340    Code Status History    Date Active Date Inactive Code Status Order ID Comments User Context   09/17/2014  3:01 AM 09/26/2014  7:29 PM Full Code [id number]  Audery Amel, MD Inpatient      TOTAL TIME TAKING CARE OF THIS PATIENT: 28 minutes.    Mckenzie Bove,  [name].D on 10/15/2015 at 3:36 PM  Between 7am to 6pm - Pager - [phone]  After 6pm go to [email or web address] - Social research officer, government  [employer] [facility]  Office  [phone]  CC: Primary care physician; ADEROJU, Marin Olp, MD

## 2015-10-15 NOTE — Progress Notes (Signed)
Per Dr. Clint GuyHower okay to discontinue order for continuous fluids

## 2015-10-20 ENCOUNTER — Other Ambulatory Visit: Payer: Self-pay | Admitting: Psychiatry

## 2015-10-20 ENCOUNTER — Telehealth: Payer: Self-pay | Admitting: *Deleted

## 2015-10-20 DIAGNOSIS — F3160 Bipolar disorder, current episode mixed, unspecified: Secondary | ICD-10-CM

## 2015-10-20 MED ORDER — OLANZAPINE 20 MG PO TABS: 20.0000 mg | ORAL_TABLET | Freq: Every day | ORAL | 6 refills | Status: DC

## 2015-10-20 MED ORDER — MIRTAZAPINE 45 MG PO TABS: 45.0000 mg | ORAL_TABLET | Freq: Every day | ORAL | 6 refills | Status: DC

## 2015-10-20 MED ORDER — QUETIAPINE FUMARATE 300 MG PO TABS: 300.0000 mg | ORAL_TABLET | Freq: Every day | ORAL | 6 refills | Status: DC

## 2015-10-21 ENCOUNTER — Other Ambulatory Visit: Payer: Self-pay | Admitting: Psychiatry

## 2015-10-22 ENCOUNTER — Inpatient Hospital Stay: Admit: 2015-10-22 | Payer: Medicare Other

## 2015-10-22 ENCOUNTER — Telehealth: Payer: Self-pay | Admitting: *Deleted

## 2016-05-19 ENCOUNTER — Other Ambulatory Visit: Payer: Self-pay | Admitting: Psychiatry

## 2016-05-19 DIAGNOSIS — F3160 Bipolar disorder, current episode mixed, unspecified: Secondary | ICD-10-CM

## 2017-02-10 ENCOUNTER — Emergency Department: Payer: Medicare Other

## 2017-02-10 ENCOUNTER — Other Ambulatory Visit: Payer: Self-pay

## 2017-02-10 ENCOUNTER — Emergency Department
Admission: EM | Admit: 2017-02-10 | Discharge: 2017-02-14 | Disposition: A | Discharge status: Other Acute Inpatient Hospital | Payer: Medicare Other | Attending: Emergency Medicine | Admitting: Emergency Medicine

## 2017-02-10 DIAGNOSIS — M549 Dorsalgia, unspecified: Secondary | ICD-10-CM

## 2017-02-10 DIAGNOSIS — N390 Urinary tract infection, site not specified: Secondary | ICD-10-CM

## 2017-02-10 DIAGNOSIS — F23 Brief psychotic disorder: Secondary | ICD-10-CM

## 2017-02-10 DIAGNOSIS — Z87891 Personal history of nicotine dependence: Secondary | ICD-10-CM | POA: Insufficient documentation

## 2017-02-10 DIAGNOSIS — G8929 Other chronic pain: Secondary | ICD-10-CM | POA: Insufficient documentation

## 2017-02-10 DIAGNOSIS — F22 Delusional disorders: Secondary | ICD-10-CM | POA: Diagnosis present

## 2017-02-10 DIAGNOSIS — I10 Essential (primary) hypertension: Secondary | ICD-10-CM | POA: Diagnosis not present

## 2017-02-10 DIAGNOSIS — F333 Major depressive disorder, recurrent, severe with psychotic symptoms: Secondary | ICD-10-CM | POA: Diagnosis present

## 2017-02-10 DIAGNOSIS — Z79899 Other long term (current) drug therapy: Secondary | ICD-10-CM | POA: Insufficient documentation

## 2017-02-10 DIAGNOSIS — F6589 Other paraphilias: Secondary | ICD-10-CM

## 2017-02-10 DIAGNOSIS — F528 Other sexual dysfunction not due to a substance or known physiological condition: Secondary | ICD-10-CM | POA: Diagnosis not present

## 2017-02-10 DIAGNOSIS — K219 Gastro-esophageal reflux disease without esophagitis: Secondary | ICD-10-CM | POA: Diagnosis present

## 2017-02-10 LAB — COMPREHENSIVE METABOLIC PANEL
ALBUMIN: 4.1 g/dL (ref 3.5–5.0)
ALT: 15 U/L (ref 14–54)
AST: 22 U/L (ref 15–41)
Alkaline Phosphatase: 70 U/L (ref 38–126)
Anion gap: 10 (ref 5–15)
BILIRUBIN TOTAL: 1.5 mg/dL — AB (ref 0.3–1.2)
BUN: 17 mg/dL (ref 6–20)
CO2: 25 mmol/L (ref 22–32)
Calcium: 8.7 mg/dL — ABNORMAL LOW (ref 8.9–10.3)
Chloride: 103 mmol/L (ref 101–111)
Creatinine, Ser: 0.69 mg/dL (ref 0.44–1.00)
GFR calc Af Amer: >60 mL/min (ref >60)
GFR calc non Af Amer: >60 mL/min (ref >60)
GLUCOSE: 102 mg/dL — AB (ref 65–99)
POTASSIUM: 3.5 mmol/L (ref 3.5–5.1)
Sodium: 138 mmol/L (ref 135–145)
TOTAL PROTEIN: 6.7 g/dL (ref 6.5–8.1)

## 2017-02-10 LAB — URINALYSIS, COMPLETE (UACMP) WITH MICROSCOPIC
BILIRUBIN URINE: NEGATIVE
GLUCOSE, UA: NEGATIVE mg/dL
KETONES UR: 20 mg/dL — AB
Nitrite: POSITIVE — AB
PROTEIN: 100 mg/dL — AB
Specific Gravity, Urine: 1.024 (ref 1.005–1.030)
pH: 5 (ref 5.0–8.0)

## 2017-02-10 LAB — CBC
HEMATOCRIT: 34.2 % — AB (ref 35.0–47.0)
HEMOGLOBIN: 11.8 g/dL — AB (ref 12.0–16.0)
MCH: 32.3 pg (ref 26.0–34.0)
MCHC: 34.4 g/dL (ref 32.0–36.0)
MCV: 93.7 fL (ref 80.0–100.0)
Platelets: 212 10*3/uL (ref 150–440)
RBC: 3.65 MIL/uL — AB (ref 3.80–5.20)
RDW: 13.7 % (ref 11.5–14.5)
WBC: 6.6 10*3/uL (ref 3.6–11.0)

## 2017-02-10 LAB — URINE DRUG SCREEN, QUALITATIVE (ARMC ONLY)
AMPHETAMINES, UR SCREEN: NOT DETECTED
BARBITURATES, UR SCREEN: NOT DETECTED
BENZODIAZEPINE, UR SCRN: NOT DETECTED
CANNABINOID 50 NG, UR ~~LOC~~: NOT DETECTED
Cocaine Metabolite,Ur ~~LOC~~: NOT DETECTED
MDMA (Ecstasy)Ur Screen: NOT DETECTED
Methadone Scn, Ur: NOT DETECTED
Opiate, Ur Screen: POSITIVE — AB
PHENCYCLIDINE (PCP) UR S: NOT DETECTED
Tricyclic, Ur Screen: NOT DETECTED

## 2017-02-10 LAB — ETHANOL: Alcohol, Ethyl (B): <10 mg/dL (ref <10)

## 2017-02-10 MED ORDER — ACETAMINOPHEN 325 MG PO TABS
650.0000 mg | ORAL_TABLET | Freq: Three times a day (TID) | ORAL | Status: DC | PRN
  Administered 2017-02-10 – 2017-02-11 (×2): 650 mg via ORAL
  Filled 2017-02-10 (×2): qty 2

## 2017-02-10 MED ORDER — LORAZEPAM 2 MG/ML IJ SOLN
INTRAMUSCULAR | Status: AC
  Administered 2017-02-10: 2 mg via INTRAMUSCULAR
  Filled 2017-02-10: qty 1

## 2017-02-10 MED ORDER — CEFTRIAXONE SODIUM IN DEXTROSE 20 MG/ML IV SOLN
1.0000 g | Freq: Once | INTRAVENOUS | Status: AC
  Administered 2017-02-10: 1 g via INTRAVENOUS
  Filled 2017-02-10: qty 50

## 2017-02-10 MED ORDER — LORAZEPAM 2 MG/ML IJ SOLN
2.0000 mg | Freq: Once | INTRAMUSCULAR | Status: AC
  Administered 2017-02-10: 2 mg via INTRAMUSCULAR

## 2017-02-10 NOTE — ED Notes (Signed)
ENVIRONMENTAL ASSESSMENT  Potentially harmful objects out of patient reach: Yes.  Personal belongings secured: Yes.  Patient dressed in hospital provided attire only: Yes.  Plastic bags out of patient reach: Yes.  Patient care equipment (cords, cables, call bells, lines, and drains) shortened, removed, or accounted for: Yes.  Equipment and supplies removed from bottom of stretcher: Yes.  Potentially toxic materials out of patient reach: Yes.  Sharps container removed or out of patient reach: Yes.   BEHAVIORAL HEALTH ROUNDING  Patient sleeping: No.  Patient alert and oriented: yes  Behavior appropriate: Yes. ; If no, describe:  Nutrition and fluids offered: Yes  Toileting and hygiene offered: Yes  Sitter present: not applicable, Q 15 min safety rounds and observation.  Law enforcement present: Yes ODS  ED BHU PLACEMENT JUSTIFICATION  Is the patient under IVC or is there intent for IVC: Yes.  Is the patient medically cleared: no  Is there vacancy in the ED BHU: Yes.  Is the population mix appropriate for patient: no.  Is the patient awaiting placement in inpatient or outpatient setting: Yes.  Has the patient had a psychiatric consult: Yes.  Survey of unit performed for contraband, proper placement and condition of furniture, tampering with fixtures in bathroom, shower, and each patient room: Yes. ; Findings: All clear  APPEARANCE/BEHAVIOR  calm, cooperative and adequate rapport can be established  NEURO ASSESSMENT  Orientation: self only Hallucinations: No.None noted.  Speech: Normal  Gait: normal per report RESPIRATORY ASSESSMENT  WNL  CARDIOVASCULAR ASSESSMENT  WNL  GASTROINTESTINAL ASSESSMENT  WNL  EXTREMITIES  WNL  PLAN OF CARE  Provide calm/safe environment. Vital signs assessed TID. ED BHU Assessment once each 12-hour shift. Collaborate with TTS daily or as condition indicates. Assure the ED provider has rounded once each shift. Provide and encourage hygiene. Provide  redirection as needed. Assess for escalating behavior; address immediately and inform ED provider.  Assess family dynamic and appropriateness for visitation as needed: Yes. ; If necessary, describe findings:  Educate the patient/family about BHU procedures/visitation: Yes. ; If necessary, describe findings: Pt is calm and cooperative at this time. Will continue to monitor with Q 15 min safety rounds and observation.

## 2017-02-10 NOTE — ED Notes (Signed)
BEHAVIORAL HEALTH ROUNDING  Patient sleeping: No.  Patient alert and oriented: yes  Behavior appropriate: Yes. ; If no, describe:  Nutrition and fluids offered: Yes  Toileting and hygiene offered: Yes  Sitter present: not applicable, Q 15 min safety rounds and observation.  Law enforcement present: Yes ODS  
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
## 2017-02-10 NOTE — ED Notes (Signed)
Family  Gwenette GreetJoan Pearson (daughter)- [phone]  Husband- [phone]

## 2017-02-10 NOTE — BH Assessment (Signed)
Assessment Note  Jenna Dudley is an 81 y.o. female presenting to the Ed with concerns of psychosis.  Pt history is limited due to altered mental status. Pt's family have concerns about patients  Paranoid and delusional behavior.  Pt has a hx of acute psychosis and has received ECT.  Diagnosis:   Past Medical History:  Past Medical History:  Diagnosis Date  . Bilateral carpal tunnel syndrome 07/27/14  . Chronic low back pain 07/29/14  . Degenerative arthritis of lumbar spine 07/27/14  . Depression   . Esophageal spasm 07/29/14  . GERD (gastroesophageal reflux disease)   . Hyperlipemia 07/29/14  . Hypertension   . Spinal stenosis 07/29/14    Past Surgical History:  Procedure Laterality Date  . HIP SURGERY Right   . INNER EAR SURGERY Right   . REPLACEMENT TOTAL KNEE BILATERAL Bilateral 07/27/14    Family History:  Family History  Family history unknown: Yes    Social History:  reports that she quit smoking about 41 years ago. Her smoking use included cigarettes. she has never used smokeless tobacco. She reports that she does not drink alcohol or use drugs.  Additional Social History:  Alcohol / Drug Use Pain Medications: See PTA Prescriptions: See PTA Over the Counter: See PTA History of alcohol / drug use?: No history of alcohol / drug abuse  CIWA: CIWA-Ar BP: 129/60 Pulse Rate: (!) 103 COWS:    Allergies:  Allergies  Allergen Reactions  . Asa [Aspirin] Other (See Comments)    Unknown reaction  . Sulfa Antibiotics Other (See Comments)    Unknown reaction    Home Medications:  (Not in a hospital admission)  OB/GYN Status:  No LMP recorded (lmp unknown). Patient is postmenopausal.  General Assessment Data Assessment unable to be completed: Yes Reason for not completing assessment: Pt's altered mental state [facility] Assessment: In system Is this a Tele or Face-to-Face Assessment?: Face-to-Face Is this an Initial Assessment or a Re-assessment  for this encounter?: Initial Assessment Marital status: Married [place] n/a Is patient pregnant?: No Pregnancy Status: No Living Arrangements: Spouse/significant other Can pt return to current living arrangement?: Yes Admission Status: Involuntary Is patient capable of signing voluntary admission?: No Referral Source: Self/Family/Friend Insurance type: Medicare     Crisis Care Plan Living Arrangements: Spouse/significant other Legal Guardian: Other:(self) Name of Psychiatrist: unknown Name of Therapist: unknown  Education Status Is patient currently in school?: No Current Grade: na Highest grade of school patient has completed: na Name of school: na Contact person: na  Risk to self with the past 6 months Suicidal Ideation: No Has patient been a risk to self within the past 6 months prior to admission? : No Suicidal Intent: No Has patient had any suicidal intent within the past 6 months prior to admission? : No Is patient at risk for suicide?: No Suicidal Plan?: No Has patient had any suicidal plan within the past 6 months prior to admission? : No Access to Means: No What has been your use of drugs/alcohol within the last 12 months?: unknown Previous Attempts/Gestures: No Other Self Harm Risks: altered mental status Triggers for Past Attempts: None known Intentional Self Injurious Behavior: None Family Suicide History: Unable to assess Recent stressful life event(s): Recent negative physical changes Persecutory voices/beliefs?: No Depression: No Substance abuse history and/or treatment for substance abuse?: No Suicide prevention information given to non-admitted patients: Not applicable  Risk to Others within the past 6 months Homicidal Ideation: No  Does patient have any lifetime risk of violence toward others beyond the six months prior to admission? : No Thoughts of Harm to Others: No Current Homicidal Intent: No Current Homicidal Plan: No Access to  Homicidal Means: No History of harm to others?: No Assessment of Violence: None Noted Does patient have access to weapons?: No Criminal Charges Pending?: No Does patient have a court date: No Is patient on probation?: No  Psychosis Hallucinations: None noted Delusions: None noted  Mental Status Report Appearance/Hygiene: In scrubs Eye Contact: Unable to Assess Motor Activity: Unable to assess Speech: Incoherent Level of Consciousness: Unable to assess Mood: Irritable Affect: Unable to Assess Anxiety Level: Minimal Thought Processes: Unable to Assess Judgement: Unable to Assess Orientation: Not oriented, Unable to assess Obsessive Compulsive Thoughts/Behaviors: Unable to Assess  Cognitive Functioning Concentration: Decreased Memory: Recent Impaired IQ: Average Insight: Unable to Assess Impulse Control: Unable to Assess Sleep: Unable to Assess Vegetative Symptoms: Unable to Assess  ADLScreening [facility]) Patient's cognitive ability adequate to safely complete daily activities?: No Patient able to express need for assistance with ADLs?: No Independently performs ADLs?: No  Prior Inpatient Therapy Prior Inpatient Therapy: No Prior Therapy Dates: na Prior Therapy Facilty/Provider(s): na Reason for Treatment: na  Prior Outpatient Therapy Prior Outpatient Therapy: No Prior Therapy Dates: na Prior Therapy Facilty/Provider(s): na Reason for Treatment: na Does patient have an ACCT team?: No Does patient have Intensive In-House Services?  : No Does patient have [facility] services? : Unknown Does patient have P4CC services?: Unknown  ADL Screening (condition at time of admission) Patient's cognitive ability adequate to safely complete daily activities?: No Patient able to express need for assistance with ADLs?: No Independently performs ADLs?: No       Abuse/Neglect Assessment (Assessment to be complete while patient is alone) Abuse/Neglect  Assessment Can Be Completed: Unable to assess, patient is non-responsive or altered mental status Values / Beliefs Cultural Requests During Hospitalization: Other (comment)(unable to assess) Spiritual Requests During Hospitalization: Other (comment)(unable to assess) Consults Spiritual Care Consult Needed: No Social Work Consult Needed: No Merchant navy officerAdvance Directives (For Healthcare) Does Patient Have a Medical Advance Directive?: No(unknown) Would patient like information on creating a medical advance directive?: No - Patient declined    Additional Information 1:1 In Past 12 Months?: No CIRT Risk: No Elopement Risk: No Does patient have medical clearance?: Yes     Disposition:  Disposition Initial Assessment Completed for this Encounter: Yes Disposition of Patient: Pending Review with psychiatrist  On Site Evaluation by:   Reviewed with Physician:    Artist Beachoxana C Yatzary Merriweather 02/10/2017 11:32 PM

## 2017-02-10 NOTE — ED Triage Notes (Signed)
Pt came to ED via EMS from home. Per family, pt not making any sense. Pt has history of mental illness per family, unable to tell us exact diagnosis but pt has had shock therapy in the past.

## 2017-02-11 DIAGNOSIS — F23 Brief psychotic disorder: Secondary | ICD-10-CM | POA: Diagnosis not present

## 2017-02-11 MED ORDER — OLANZAPINE 10 MG IM SOLR
10.0000 mg | Freq: Two times a day (BID) | INTRAMUSCULAR | Status: DC | PRN
Start: 2017-02-11 — End: 2017-02-14
  Administered 2017-02-11: 10 mg via INTRAMUSCULAR
  Filled 2017-02-11: qty 10

## 2017-02-11 MED ORDER — LOSARTAN POTASSIUM 50 MG PO TABS
25.0000 mg | ORAL_TABLET | Freq: Every day | ORAL | Status: DC
  Administered 2017-02-11 – 2017-02-14 (×4): 25 mg via ORAL
  Filled 2017-02-11 (×4): qty 1

## 2017-02-11 MED ORDER — CEPHALEXIN 500 MG PO CAPS
500.0000 mg | ORAL_CAPSULE | Freq: Two times a day (BID) | ORAL | Status: DC
  Administered 2017-02-11 – 2017-02-14 (×7): 500 mg via ORAL
  Filled 2017-02-11 (×7): qty 1

## 2017-02-11 MED ORDER — OLANZAPINE 10 MG PO TABS
10.0000 mg | ORAL_TABLET | Freq: Two times a day (BID) | ORAL | Status: DC
  Administered 2017-02-11 – 2017-02-14 (×7): 10 mg via ORAL
  Filled 2017-02-11 (×7): qty 1

## 2017-02-11 MED ORDER — SIMVASTATIN 10 MG PO TABS
40.0000 mg | ORAL_TABLET | Freq: Every day | ORAL | Status: DC
  Administered 2017-02-11 – 2017-02-13 (×4): 40 mg via ORAL
  Filled 2017-02-11 (×4): qty 4

## 2017-02-11 MED ORDER — PANTOPRAZOLE SODIUM 40 MG PO TBEC
40.0000 mg | DELAYED_RELEASE_TABLET | Freq: Every day | ORAL | Status: DC
  Administered 2017-02-11 – 2017-02-14 (×4): 40 mg via ORAL
  Filled 2017-02-11 (×4): qty 1

## 2017-02-11 NOTE — ED Notes (Signed)
BEHAVIORAL HEALTH ROUNDING Patient sleeping: Yes.   Patient alert and oriented: not applicable SLEEPING Behavior appropriate: Yes.  ; If no, describe: SLEEPING Nutrition and fluids offered: No SLEEPING Toileting and hygiene offered: NoSLEEPING Sitter present: not applicable, Q 15 min safety rounds and observation. Law enforcement present: Yes ODS 

## 2017-02-11 NOTE — ED Notes (Signed)
BEHAVIORAL HEALTH ROUNDING  Patient sleeping: No.  Patient alert and oriented: yes  Behavior appropriate: Yes. ; If no, describe:  Nutrition and fluids offered: Yes  Toileting and hygiene offered: Yes  Sitter present: not applicable, Q 15 min safety rounds and observation.  Law enforcement present: Yes ODS  

## 2017-02-11 NOTE — ED Notes (Signed)
BEHAVIORAL HEALTH ROUNDING  Patient sleeping: No.  Patient alert and oriented: yes to her baseline Behavior appropriate: Yes. ; If no, describe:  Nutrition and fluids offered: Yes  Toileting and hygiene offered: Yes  Sitter present: not applicable, Q 15 min safety rounds and observation.  Law enforcement present: Yes ODS  

## 2017-02-11 NOTE — ED Provider Notes (Signed)
-----------------------------------------   1:45 AM on 02/11/2017 -----------------------------------------  [facility] Orpah Clintonollin who recommends admission to inpatient psychiatry service.  Recommends holding her Remeron, Zoloft Recommends avoiding benzodiazepines.  Recommends Zyprexa 10 mg twice daily and as needed.   Irean HongSung, Kaitlyn Franko J, MD 02/11/17 [phone]

## 2017-02-11 NOTE — ED Notes (Signed)
Pt up and in to the hallway stating she wants to go home and she wants her own clothes. Pt also asking if her husband kicked her out of the house. Explained to pt that she is in the ER and pt assisted back to bed. Pt remains tearful and slightly agitated. Will give IM zyprexa at this time. Dr Dolores FrameSung informed. Pharmacy called for medication.

## 2017-02-11 NOTE — ED Notes (Signed)
Pt remains awake. Still talking but not as agitated. Will continue to monitor.

## 2017-02-11 NOTE — ED Notes (Signed)
Pt up to bathroom to urinate with assistance.

## 2017-02-11 NOTE — ED Notes (Signed)
Pt's daughter here to visit, updated her on plan of care.  Informed her that there may be more details tomorrow once behavioral health intake is here and can discuss with psychiatrist what the plan is.

## 2017-02-11 NOTE — ED Notes (Signed)

## 2017-02-11 NOTE — BH Assessment (Signed)
Patient evaluated by Specialist on call and recommended for inpatient admission.  Referral Geriatric placement submitted to:  [facility] [phone])  [facility] ([phone])  [facility] [phone])  [facility] [phone])  [facility] [place] [phone])  [facility] [phone])

## 2017-02-11 NOTE — ED Notes (Signed)
Daughter Aurea GraffJoan called, states she will be coming to visit later today and was inquiring about visitation hours.

## 2017-02-11 NOTE — ED Notes (Signed)
Dr. Scotty CourtStafford notified of patient's behavior. States he will check on how much zyprexa we can give.

## 2017-02-11 NOTE — ED Notes (Signed)
Pt lying in room talking to herself. Pt is only alert to self and place. She is confused about the date, time and situation.

## 2017-02-11 NOTE — ED Notes (Signed)
Pt frequently pulling at loose teeth and sliding to the edge of the bed. Assisted patient to the bathroom at this time. Pt given tylenol and applesauce as well.  Pt trying to find things to chew on and tried to grab one of my fingers to chew on.  Pt is alert to self and place only.

## 2017-02-11 NOTE — BH Assessment (Signed)
Patient under review for placement with [facility] [phone])

## 2017-02-12 DIAGNOSIS — F333 Major depressive disorder, recurrent, severe with psychotic symptoms: Secondary | ICD-10-CM

## 2017-02-12 DIAGNOSIS — F23 Brief psychotic disorder: Secondary | ICD-10-CM | POA: Diagnosis not present

## 2017-02-12 MED ORDER — MIRTAZAPINE 15 MG PO TBDP
45.0000 mg | ORAL_TABLET | Freq: Every day | ORAL | Status: DC
  Administered 2017-02-12 – 2017-02-13 (×2): 45 mg via ORAL
  Filled 2017-02-12 (×3): qty 3

## 2017-02-12 MED ORDER — MIRTAZAPINE 30 MG PO TBDP
45.0000 mg | ORAL_TABLET | Freq: Every day | ORAL | Status: DC
  Filled 2017-02-12: qty 1

## 2017-02-12 MED ORDER — ENSURE ENLIVE PO LIQD
237.0000 mL | Freq: Three times a day (TID) | ORAL | Status: DC
  Administered 2017-02-12 – 2017-02-14 (×3): 237 mL via ORAL

## 2017-02-12 MED ORDER — HALOPERIDOL LACTATE 5 MG/ML IJ SOLN
5.0000 mg | Freq: Once | INTRAMUSCULAR | Status: AC
  Administered 2017-02-12: 5 mg via INTRAMUSCULAR
  Filled 2017-02-12: qty 1

## 2017-02-12 MED ORDER — SERTRALINE HCL 50 MG PO TABS
200.0000 mg | ORAL_TABLET | Freq: Every day | ORAL | Status: DC
  Administered 2017-02-12 – 2017-02-14 (×3): 200 mg via ORAL
  Filled 2017-02-12 (×3): qty 4

## 2017-02-12 NOTE — ED Notes (Signed)
BEHAVIORAL HEALTH ROUNDING Patient sleeping: Yes.   Patient alert and oriented: not applicable SLEEPING Behavior appropriate: Yes.  ; If no, describe: SLEEPING Nutrition and fluids offered: No SLEEPING Toileting and hygiene offered: NoSLEEPING Sitter present: not applicable, Q 15 min safety rounds and observation. Law enforcement present: Yes ODS 

## 2017-02-12 NOTE — ED Notes (Signed)
Patient observed lying in bed with eyes closed  Even, unlabored respirations observed   NAD pt appears to be sleeping  I will continue to monitor along with every 15 minute visual observations and ongoing security monitoring    

## 2017-02-12 NOTE — ED Notes (Signed)
Pt given to meal tray.

## 2017-02-12 NOTE — ED Provider Notes (Signed)
The patient ate her own feces.  Given haloperidol for her bizarre behavior.   Merrily Brittleifenbark, Eastyn Dattilo, MD 02/12/17 [id number]

## 2017-02-12 NOTE — ED Notes (Signed)
BEHAVIORAL HEALTH ROUNDING Patient sleeping: No. Patient alert and oriented: yes Behavior appropriate: Yes.  ; If no, describe:  Nutrition and fluids offered: yes Toileting and hygiene offered: Yes  Sitter present: q15 minute observations and security  monitoring Law enforcement present: Yes  ODS  

## 2017-02-12 NOTE — ED Notes (Signed)

## 2017-02-12 NOTE — ED Notes (Signed)
ED  Is the patient under IVC or is there intent for IVC: Yes.   Is the patient medically cleared: Yes.   Is there vacancy in the ED BHU: Yes.   Is the population mix appropriate for patient:  geriatric Is the patient awaiting placement in inpatient or outpatient setting:  - geripsych placement   Has the patient had a psychiatric consult: consult pending  Survey of unit performed for contraband, proper placement and condition of furniture, tampering with fixtures in bathroom, shower, and each patient room: Yes.  ; Findings:  APPEARANCE/BEHAVIOR Calm and cooperative NEURO ASSESSMENT Orientation: oriented to self, place   Denies pain Hallucinations: No.None noted (Hallucinations)  denies Speech: Normal -  repetitive Gait: unsteady gait present - stand by assistance provided  RESPIRATORY ASSESSMENT Even  Unlabored respirations  CARDIOVASCULAR ASSESSMENT Pulses equal   regular rate  Skin warm and dry   GASTROINTESTINAL ASSESSMENT no GI complaint EXTREMITIES Full ROM  PLAN OF CARE Provide calm/safe environment. Vital signs assessed twice daily. ED BHU Assessment once each 12-hour shift. Collaborate with TTS when available or as condition indicates. Assure the ED provider has rounded once each shift. Provide and encourage hygiene. Provide redirection as needed. Assess for escalating behavior; address immediately and inform ED provider.  Assess family dynamic and appropriateness for visitation as needed: Yes.  ; If necessary, describe findings:  Educate the patient/family about BHU procedures/visitation: Yes.  ; If necessary, describe findings:

## 2017-02-12 NOTE — ED Notes (Signed)
BEHAVIORAL HEALTH ROUNDING  Patient sleeping: No.  Patient alert and oriented: yes  Behavior appropriate: Yes. ; If no, describe:  Nutrition and fluids offered: Yes  Toileting and hygiene offered: Yes  Sitter present: not applicable, Q 15 min safety rounds and observation.  Law enforcement present: Yes ODS  

## 2017-02-12 NOTE — Consult Note (Signed)
[facility] Consult   Reason for Consult: Consult for 81 year old woman with a history of recurrent psychotic depression brought to the emergency room because of severe change in mental state Referring Physician: Quentin Dudley Patient Identification: Jenna Dudley MRN:  [id number] Principal Diagnosis: Major depressive disorder, recurrent episode, severe, with psychotic behavior [facility]) Diagnosis:   Patient Active Problem List   Diagnosis Date Noted  . Acute encephalopathy [G93.40] 10/04/2015  . Sepsis ([place]) [A41.9] 10/03/2015  . Acute pyelonephritis [N10] 10/03/2015  . Hypernatremia [E87.0] 10/03/2015  . Lactic acidosis [E87.2] 10/03/2015  . Acute renal insufficiency [N28.9] 10/03/2015  . Leukocytosis [D72.829] 10/03/2015  . Agitation [R45.1] 10/03/2015  . Severe recurrent major depression without psychotic features ([place]) [F33.2]   . Major depressive disorder, recurrent episode, severe, with psychotic behavior ([place]) [F33.3] 10/13/2014  . Severe episode of recurrent major depressive disorder, with psychotic features ([place]) [F33.3] 10/13/2014  . Major depressive disorder, 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 features ([place]) [F33.2] 10/06/2014  . Infection of urinary tract [N39.0] 09/17/2014  . Essential hypertension [I10] 09/17/2014  . Chronic back pain [M54.9, G89.29] 09/17/2014  . Gastric reflux [K21.9] 09/17/2014  . History of stroke [Z86.73] 09/17/2014  . Hypertension [I10] 09/16/2014  . Severe recurrent major depression with psychotic features ([place]) [F33.3]   . Hallux abductovalgus with bunions [M20.10, M21.619] 09/12/2013  . Hammer toe [M20.40] 09/12/2013  . Foot pain [M79.673] 09/12/2013  . Fungal infection of nail [B35.1] 09/12/2013  . Carpal tunnel syndrome [G56.00] 12/21/2010  . Chronic LBP [M54.5, G89.29] 12/21/2010  . Degenerative arthritis of lumbar spine [M47.816] 12/21/2010  . Clinical depression [F32.9] 12/21/2010  . 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 HLD (hyperlipidemia) [E78.5] 12/21/2010  . BP (high blood pressure) [I10] 12/21/2010  . Acid reflux [K21.9] 12/21/2010  . Spinal stenosis [M48.00] 12/21/2010    Total Time spent with patient: 1 hour  Subjective:   ROSHELL Dudley is a 81 y.o. female patient admitted with "I think I did something wrong".  HPI: Patient interviewed chart reviewed.  81 year old woman with a history of recurrent episodes of severe psychotic depression.  I spoke with her husband also as well as reviewing the chart and talking with other clinical staff.  Husband reports that she started to seem a little bit different from baseline a month ago but in the last couple weeks has declined a great deal.  She has gone back to being almost unresponsive at times.  Not eating well.  Talking to herself mumbling appearing to be in a great deal of distress frequently crying.  On interview today it was difficult to engage the patient.  She did seem to recognize me and at times seem to be engaging in slightly appropriate conversation but would easily slip off into becoming confused and muttering about the things that she feels guilty about and speaking in ways that did not make any sense.  Eye contact intermittent.  She did get up and go to the restroom today according to nursing staff but she has eaten hardly anything all day.  They are getting medication into her as best they can.  No known specific new stressor.  Reportedly had been still on her psychiatric medicine.  Social history: Patient is married has 4 children all adults obviously.  Good relationship with her husband who is educated about her illness and has gone through this with her several times before.  Medical history: History of high blood pressure gastric reflux chronic back pain which at times has caused  her a lot of trouble.  Has an acute urinary tract infection now.  Substance abuse history: No history of drug or alcohol abuse current or past  Past Psychiatric  History: Patient has a long history of psychiatric disorder and has been admitted to hospital several times.  Has a history of becoming nearly catatonic with depression and psychosis and having suicidal ideation.  Patient used to be a regular ECT client and for a couple years or more gotten maintenance ECT regularly which helped her a great deal to stay stable.  A year ago this past summer she chose to stop coming for maintenance ECT treatments  Risk to Self: Suicidal Ideation: No Suicidal Intent: No Is patient at risk for suicide?: No Suicidal Plan?: No Access to Means: No What has been your use of drugs/alcohol within the last 12 months?: unknown Other Self Harm Risks: altered mental status Triggers for Past Attempts: None known Intentional Self Injurious Behavior: None Risk to Others: Homicidal Ideation: No Thoughts of Harm to Others: No Current Homicidal Intent: No Current Homicidal Plan: No Access to Homicidal Means: No History of harm to others?: No Assessment of Violence: None Noted Does patient have access to weapons?: No Criminal Charges Pending?: No Does patient have a court date: No Prior Inpatient Therapy: Prior Inpatient Therapy: No Prior Therapy Dates: na Prior Therapy Facilty/Provider(s): na Reason for Treatment: na Prior Outpatient Therapy: Prior Outpatient Therapy: No Prior Therapy Dates: na Prior Therapy Facilty/Provider(s): na Reason for Treatment: na Does patient have an ACCT team?: No Does patient have Intensive In-House Services?  : No Does patient have [facility] services? : Unknown Does patient have P4CC services?: Unknown  Past Medical History:  Past Medical History:  Diagnosis Date  . Bilateral carpal tunnel syndrome 07/27/14  . Chronic low back pain 07/29/14  . Degenerative arthritis of lumbar spine 07/27/14  . Depression   . Esophageal spasm 07/29/14  . GERD (gastroesophageal reflux disease)   . Hyperlipemia 07/29/14  . Hypertension   . Spinal stenosis  07/29/14    Past Surgical History:  Procedure Laterality Date  . HIP SURGERY Right   . INNER EAR SURGERY Right   . REPLACEMENT TOTAL KNEE BILATERAL Bilateral 07/27/14   Family History:  Family History  Family history unknown: Yes   Family Psychiatric  History: Positive for anxiety mostly Social History:  Social History   Substance and Sexual Activity  Alcohol Use No  . Alcohol/week: 0.0 oz     Social History   Substance and Sexual Activity  Drug Use No    Social History   Socioeconomic History  . Marital status: Widowed    Spouse name: None  . Number of children: None  . Years of education: None  . Highest education level: None  Social Needs  . Financial resource strain: None  . Food insecurity - worry: None  . Food insecurity - inability: None  . Transportation needs - medical: None  . Transportation needs - non-medical: None  Occupational History  . None  Tobacco Use  . Smoking status: Former Smoker    Types: Cigarettes    Last attempt to quit: 07/29/1975    Years since quitting: 41.5  . Smokeless tobacco: Never Used  Substance and Sexual Activity  . Alcohol use: No    Alcohol/week: 0.0 oz  . Drug use: No  . Sexual activity: No    Comment: postmenopause  Other Topics Concern  . None  Social History Narrative  . None  Additional Social History:    Allergies:   Allergies  Allergen Reactions  . Asa [Aspirin] Other (See Comments)    Unknown reaction  . Sulfa Antibiotics Other (See Comments)    Unknown reaction    Labs:  Results for orders placed or performed during the hospital encounter of 02/10/17 (from the past 48 hour(s))  CBC     Status: Abnormal   Collection Time: 02/10/17  5:46 PM  Result Value Ref Range   WBC 6.6 3.6 - 11.0 K/uL   RBC 3.65 (L) 3.80 - 5.20 MIL/uL   Hemoglobin 11.8 (L) 12.0 - 16.0 g/dL   HCT 34.2 (L) 35.0 - 47.0 %   MCV 93.7 80.0 - 100.0 fL   MCH 32.3 26.0 - 34.0 pg   MCHC 34.4 32.0 - 36.0 g/dL   RDW 13.7 11.5 - 14.5 %    Platelets 212 150 - 440 K/uL  Comprehensive metabolic panel     Status: Abnormal   Collection Time: 02/10/17  5:46 PM  Result Value Ref Range   Sodium 138 135 - 145 mmol/L   Potassium 3.5 3.5 - 5.1 mmol/L   Chloride 103 101 - 111 mmol/L   CO2 25 22 - 32 mmol/L   Glucose, Bld 102 (H) 65 - 99 mg/dL   BUN 17 6 - 20 mg/dL   Creatinine, Ser 0.69 0.44 - 1.00 mg/dL   Calcium 8.7 (L) 8.9 - 10.3 mg/dL   Total Protein 6.7 6.5 - 8.1 g/dL   Albumin 4.1 3.5 - 5.0 g/dL   AST 22 15 - 41 U/L   ALT 15 14 - 54 U/L   Alkaline Phosphatase 70 38 - 126 U/L   Total Bilirubin 1.5 (H) 0.3 - 1.2 mg/dL   GFR calc non Af Amer >60 >60 mL/min   GFR calc Af Amer >60 >60 mL/min    Comment: (NOTE) The eGFR has been calculated using the CKD EPI equation. This calculation has not been validated in all clinical situations. eGFR's persistently <60 mL/min signify possible Chronic Kidney Disease.    Anion gap 10 5 - 15  Ethanol     Status: None   Collection Time: 02/10/17  5:46 PM  Result Value Ref Range   Alcohol, Ethyl (B) <10 <10 mg/dL    Comment:        LOWEST DETECTABLE LIMIT FOR SERUM ALCOHOL IS 10 mg/dL FOR MEDICAL PURPOSES ONLY   Urinalysis, Complete w Microscopic     Status: Abnormal   Collection Time: 02/10/17  5:46 PM  Result Value Ref Range   Color, Urine AMBER (A) YELLOW    Comment: BIOCHEMICALS MAY BE AFFECTED BY COLOR   APPearance CLOUDY (A) CLEAR   Specific Gravity, Urine 1.024 1.005 - 1.030   pH 5.0 5.0 - 8.0   Glucose, UA NEGATIVE NEGATIVE mg/dL   Hgb urine dipstick SMALL (A) NEGATIVE   Bilirubin Urine NEGATIVE NEGATIVE   Ketones, ur 20 (A) NEGATIVE mg/dL   Protein, ur 100 (A) NEGATIVE mg/dL   Nitrite POSITIVE (A) NEGATIVE   Leukocytes, UA MODERATE (A) NEGATIVE   RBC / HPF TOO NUMEROUS TO COUNT 0 - 5 RBC/hpf   WBC, UA TOO NUMEROUS TO COUNT 0 - 5 WBC/hpf   Bacteria, UA MANY (A) NONE SEEN   Squamous Epithelial / LPF 0-5 (A) NONE SEEN   WBC Clumps PRESENT    Mucus PRESENT     Hyaline Casts, UA PRESENT   Urine Drug Screen, Qualitative ([facility] only)  Status: Abnormal   Collection Time: 02/10/17  5:46 PM  Result Value Ref Range   Tricyclic, Ur Screen NONE DETECTED NONE DETECTED   Amphetamines, Ur Screen NONE DETECTED NONE DETECTED   MDMA (Ecstasy)Ur Screen NONE DETECTED NONE DETECTED   Cocaine Metabolite,Ur [place] NONE DETECTED NONE DETECTED   Opiate, Ur Screen POSITIVE (A) NONE DETECTED   Phencyclidine (PCP) Ur S NONE DETECTED NONE DETECTED   Cannabinoid 50 Ng, Ur [place] NONE DETECTED NONE DETECTED   Barbiturates, Ur Screen NONE DETECTED NONE DETECTED   Benzodiazepine, Ur Scrn NONE DETECTED NONE DETECTED   Methadone Scn, Ur NONE DETECTED NONE DETECTED    Comment: (NOTE) 086  Tricyclics, urine               Cutoff 1000 ng/mL 200  Amphetamines, urine             Cutoff 1000 ng/mL 300  MDMA (Ecstasy), urine           Cutoff 500 ng/mL 400  Cocaine Metabolite, urine       Cutoff 300 ng/mL 500  Opiate, urine                   Cutoff 300 ng/mL 600  Phencyclidine (PCP), urine      Cutoff 25 ng/mL 700  Cannabinoid, urine              Cutoff 50 ng/mL 800  Barbiturates, urine             Cutoff 200 ng/mL 900  Benzodiazepine, urine           Cutoff 200 ng/mL 1000 Methadone, urine                Cutoff 300 ng/mL 1100 1200 The urine drug screen provides only a preliminary, unconfirmed 1300 analytical test result and should not be used for non-medical 1400 purposes. Clinical consideration and professional judgment should 1500 be applied to any positive drug screen result due to possible 1600 interfering substances. A more specific alternate chemical method 1700 must be used in order to obtain a confirmed analytical result.  1800 Gas chromato graphy / mass spectrometry (GC/MS) is the preferred 1900 confirmatory method.   Urine Culture     Status: Abnormal (Preliminary result)   Collection Time: 02/10/17  5:46 PM  Result Value Ref Range   Specimen Description URINE, RANDOM     Special Requests NONE    Culture >=100,000 COLONIES/mL ESCHERICHIA COLI (A)    Report Status PENDING     Current Facility-Administered Medications  Medication Dose Route Frequency Provider Last Rate Last Dose  . acetaminophen (TYLENOL) tablet 650 mg  650 mg Oral Q8H PRN Lavonia Drafts, MD   650 mg at 02/11/17 1640  . cephALEXin (KEFLEX) capsule 500 mg  500 mg Oral Q12H Delman Kitten, MD   500 mg at 02/12/17 1152  . feeding supplement (ENSURE ENLIVE) (ENSURE ENLIVE) liquid 237 mL  237 mL Oral TID BM Clapacs, John T, MD      . losartan (COZAAR) tablet 25 mg  25 mg Oral Daily Paulette Blanch, MD   25 mg at 02/12/17 1151  . mirtazapine (REMERON SOL-TAB) disintegrating tablet 45 mg  45 mg Oral QHS Clapacs, John T, MD      . OLANZapine (ZYPREXA) injection 10 mg  10 mg Intramuscular BID PRN Paulette Blanch, MD   10 mg at 02/11/17 0208  . OLANZapine (ZYPREXA) tablet 10 mg  10 mg Oral BID Beather Arbour,  Gretchen Short, MD   10 mg at 02/12/17 1151  . pantoprazole (PROTONIX) EC tablet 40 mg  40 mg Oral Daily Paulette Blanch, MD   40 mg at 02/12/17 1151  . sertraline (ZOLOFT) tablet 200 mg  200 mg Oral Daily Clapacs, John T, MD      . simvastatin (ZOCOR) tablet 40 mg  40 mg Oral QHS Paulette Blanch, MD   40 mg at 02/11/17 2150   Current Outpatient Medications  Medication Sig Dispense Refill  . acetaminophen-codeine (TYLENOL #3) 300-30 MG per tablet Take 1 tablet by mouth every 8 (eight) hours as needed for moderate pain or severe pain.    Marland Kitchen losartan (COZAAR) 25 MG tablet Take 1 tablet (25 mg total) by mouth daily. 30 tablet 0  . omeprazole (PRILOSEC) 20 MG capsule Take 20 mg by mouth 2 (two) times daily before a meal.     . simvastatin (ZOCOR) 40 MG tablet Take 40 mg by mouth at bedtime.     . hydrOXYzine (ATARAX/VISTARIL) 50 MG tablet Take 1 tablet (50 mg total) by mouth 3 (three) times daily as needed for anxiety. (Patient not taking: Reported on 02/12/2017) 30 tablet 0  . mirtazapine (REMERON) 45 MG tablet Take 1 tablet (45 mg  total) by mouth at bedtime. (Patient not taking: Reported on 02/12/2017) 30 tablet 6  . OLANZapine (ZYPREXA) 20 MG tablet Take 1 tablet (20 mg total) by mouth at bedtime. (Patient not taking: Reported on 02/12/2017) 30 tablet 6  . potassium chloride SA (K-DUR,KLOR-CON) 20 MEQ tablet Take 20 mEq by mouth daily.    . QUEtiapine (SEROQUEL) 300 MG tablet Take 1 tablet (300 mg total) by mouth at bedtime. (Patient not taking: Reported on 02/12/2017) 30 tablet 6  . sertraline (ZOLOFT) 100 MG tablet Take 2 tablets (200 mg total) by mouth daily. 2 tabs in the am (Patient not taking: Reported on 02/12/2017) 180 tablet 6   Facility-Administered Medications Ordered in Other Encounters  Medication Dose Route Frequency Provider Last Rate Last Dose  . labetalol (NORMODYNE,TRANDATE) injection 20 mg  20 mg Intravenous Once Clapacs, John T, MD      . labetalol (NORMODYNE,TRANDATE) injection 20 mg  20 mg Intravenous Once Clapacs, John T, MD      . lactated ringers infusion   Intravenous Continuous Clapacs, John T, MD      . methohexital Sodium 60 mg  60 mg Intravenous Once Clapacs, John T, MD      . methohexital Sodium 60 mg  60 mg Intravenous Once Clapacs, John T, MD      . succinylcholine (ANECTINE) injection 80 mg  80 mg Intravenous Once Clapacs, John T, MD      . succinylcholine (ANECTINE) injection 80 mg  80 mg Intravenous Once Clapacs, Madie Reno, MD        Musculoskeletal: Strength & Muscle Tone: decreased Gait & Station: unsteady Patient leans: N/A  Psychiatric Specialty Exam: Physical Exam  Nursing note and vitals reviewed. Constitutional: She appears well-developed and well-nourished.  HENT:  Head: Normocephalic and atraumatic.  Eyes: Conjunctivae are normal. Pupils are equal, round, and reactive to light.  Neck: Normal range of motion.  Cardiovascular: Regular rhythm and normal heart sounds.  Respiratory: Effort normal. No respiratory distress.  GI: Soft.  Musculoskeletal: Normal range of  motion.  Neurological: She is alert. A cranial nerve deficit is present.  Patient has an old Bell's palsy causing some asymmetry in her face.  This is a chronic finding and  is not changed from baseline  Skin: Skin is warm and dry.  Psychiatric: Her mood appears anxious. Her affect is inappropriate. Her speech is tangential and slurred. She is agitated. She is not aggressive. Thought content is paranoid. Cognition and memory are impaired. She expresses impulsivity. She expresses no homicidal and no suicidal ideation. She is noncommunicative. She exhibits abnormal recent memory. She is inattentive.    Review of Systems  Unable to perform ROS: Psychiatric disorder    Blood pressure (!) 167/75, pulse 91, temperature 98 F (36.7 C), temperature source Oral, resp. rate 19, weight 69.9 kg (154 lb), SpO2 92 %.Body mass index is 28.17 kg/m.  General Appearance: Disheveled  Eye Contact:  Minimal  Speech:  Garbled and Slow  Volume:  Decreased  Mood:  Anxious, Depressed and Dysphoric  Affect:  Congruent and Inappropriate  Thought Process:  Disorganized  Orientation:  Negative  Thought Content:  Illogical, Paranoid Ideation and Rumination  Suicidal Thoughts:  No  Homicidal Thoughts:  No  Memory:  Immediate;   Fair Recent;   Poor Remote;   Poor  Judgement:  Impaired  Insight:  Shallow  Psychomotor Activity:  Decreased  Concentration:  Concentration: Poor  Recall:  Poor  Fund of Knowledge:  Poor  Language:  Poor  Akathisia:  No  Handed:  Right  AIMS (if indicated):     Assets:  Housing Physical Health Resilience Social Support  ADL's:  Impaired  Cognition:  Impaired,  Mild  Sleep:        Treatment Plan Summary: Daily contact with patient to assess and evaluate symptoms and progress in treatment, Medication management and Plan Patient with recurrent severe psychotic depression who is nearly catatonic typical of what she has been like in the past.  Patient has a history of very good  response to ECT and ideally I think this would still be the best immediate treatment.  Unfortunately in her current condition and at her current age she is probably not safe to be managed on the inpatient psychiatric unit here.  Discussed case with the charge nurse and TTS.  We will try referring her out to geriatric psychiatry units.  I spoke with her husband and he is aware of the plan.  Meanwhile restart his Zoloft Remeron and Zyprexa which were stable medicines for her in the past.  Adding Ensure to try and get more calories into her.  Regular monitoring here in the emergency room.  Nursing aware of the plan and  Disposition: Recommend psychiatric Inpatient admission when medically cleared. Supportive therapy provided about ongoing stressors.  Alethia Berthold, MD 02/12/2017 4:24 PM

## 2017-02-12 NOTE — ED Notes (Signed)
BEHAVIORAL HEALTH ROUNDING Patient sleeping: Yes.   Patient alert and oriented: eyes closed  Appears to be asleep Behavior appropriate: Yes.  ; If no, describe:  Nutrition and fluids offered: Yes  Toileting and hygiene offered: sleeping Sitter present: q 15 minute observations and security monitoring Law enforcement present: yes  ODS 

## 2017-02-12 NOTE — ED Notes (Signed)
Pt assisted to bathroom and back to room.

## 2017-02-12 NOTE — ED Notes (Signed)
PT IVC/ PENDING PLACEMENT  

## 2017-02-12 NOTE — ED Provider Notes (Signed)
-----------------------------------------   7:19 AM on 02/12/2017 -----------------------------------------   Blood pressure (!) 167/75, pulse 91, temperature 98 F (36.7 C), temperature source Oral, resp. rate 19, weight 69.9 kg (154 lb), SpO2 92 %.  The patient had no acute events since last update.  Calm and cooperative at this time.  Disposition is pending Psychiatry/Behavioral Medicine team recommendations.     Willy Eddyobinson, Willmer Fellers, MD 02/12/17 [phone]

## 2017-02-12 NOTE — ED Notes (Signed)
Psychiatrist is currently consulting with her

## 2017-02-12 NOTE — ED Notes (Signed)
She has ambulated to the BR - stand by assistance provided

## 2017-02-13 DIAGNOSIS — F23 Brief psychotic disorder: Secondary | ICD-10-CM | POA: Diagnosis not present

## 2017-02-13 DIAGNOSIS — F333 Major depressive disorder, recurrent, severe with psychotic symptoms: Secondary | ICD-10-CM | POA: Diagnosis not present

## 2017-02-13 LAB — URINE CULTURE

## 2017-02-13 NOTE — ED Notes (Signed)
Patient in room sleeping at this time.

## 2017-02-13 NOTE — Consult Note (Signed)
[facility] Face-to-Face Psychiatry Consult   Reason for Consult: Follow up for this 81 year old woman with severe recurrent psychotic depression currently in the emergency room. Referring Physician: Mayford Knife Patient Identification: DANASIA BAKER MRN:  [id number] Principal Diagnosis: Major depressive disorder, recurrent episode, severe, with psychotic behavior [facility]) Diagnosis:   Patient Active Problem List   Diagnosis Date Noted  . Acute encephalopathy [G93.40] 10/04/2015  . Sepsis (HCC) [A41.9] 10/03/2015  . Acute pyelonephritis [N10] 10/03/2015  . Hypernatremia [E87.0] 10/03/2015  . Lactic acidosis [E87.2] 10/03/2015  . Acute renal insufficiency [N28.9] 10/03/2015  . Leukocytosis [D72.829] 10/03/2015  . Agitation [R45.1] 10/03/2015  . Severe recurrent major depression without psychotic features (HCC) [F33.2]   . Major depressive disorder, recurrent episode, severe, with psychotic behavior (HCC) [F33.3] 10/13/2014  . Severe episode of recurrent major depressive disorder, with psychotic features (HCC) [F33.3] 10/13/2014  . Major depressive disorder, 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 features (HCC) [F33.2] 10/06/2014  . Infection of urinary tract [N39.0] 09/17/2014  . Essential hypertension [I10] 09/17/2014  . Chronic back pain [M54.9, G89.29] 09/17/2014  . Gastric reflux [K21.9] 09/17/2014  . History of stroke [Z86.73] 09/17/2014  . Hypertension [I10] 09/16/2014  . Severe recurrent major depression with psychotic features (HCC) [F33.3]   . Hallux abductovalgus with bunions [M20.10, M21.619] 09/12/2013  . Hammer toe [M20.40] 09/12/2013  . Foot pain [M79.673] 09/12/2013  . Fungal infection of nail [B35.1] 09/12/2013  . Carpal tunnel syndrome [G56.00] 12/21/2010  . Chronic LBP [M54.5, G89.29] 12/21/2010  . Degenerative arthritis of lumbar spine [M47.816] 12/21/2010  . Clinical depression [F32.9] 12/21/2010  . 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 HLD (hyperlipidemia) [E78.5]  12/21/2010  . BP (high blood pressure) [I10] 12/21/2010  . Acid reflux [K21.9] 12/21/2010  . Spinal stenosis [M48.00] 12/21/2010    Total Time spent with patient: 20 minutes  Subjective:   AKASIA AHMAD is a 81 y.o. female patient admitted with "I think I did something bad".  HPI: Follow-up today for this patient who has been here for a couple days.  She presented with a nearly catatonic condition typical of her retreats into severe depression.  She was not appropriate for admission to the psychiatry ward at this hospital because of her age and high nursing acuity.  Today the patient was sitting up in bed.  She had eaten a little bit more.  She was able to converse back and forth with me although she remains confused and disorganized and frequently loses the train of the conversation and begins ruminating on her strange guilty paranoid thoughts again.  She reportedly use the bathroom in the bed on one occasion although the exact circumstances are unclear.  She has been taking her medication.  Vital signs look stable.  Past Psychiatric History: Long-standing episodes of severe psychotic depression previously well controlled with ECT.  ECT would be ideal as a treatment at this point but we cannot proceed while she is in the emergency room.  Has also responded to the combination of medicine she is taking at this time.  Risk to Self: Suicidal Ideation: No Suicidal Intent: No Is patient at risk for suicide?: No Suicidal Plan?: No Access to Means: No What has been your use of drugs/alcohol within the last 12 months?: unknown Other Self Harm Risks: altered mental status Triggers for Past Attempts: None known Intentional Self Injurious Behavior: None Risk to Others: Homicidal Ideation: No Thoughts of Harm to Others: No Current Homicidal Intent: No Current Homicidal Plan: No Access to Homicidal Means: No  History of harm to others?: No Assessment of Violence: None Noted Does patient have access  to weapons?: No Criminal Charges Pending?: No Does patient have a court date: No Prior Inpatient Therapy: Prior Inpatient Therapy: No Prior Therapy Dates: na Prior Therapy Facilty/Provider(s): na Reason for Treatment: na Prior Outpatient Therapy: Prior Outpatient Therapy: No Prior Therapy Dates: na Prior Therapy Facilty/Provider(s): na Reason for Treatment: na Does patient have an ACCT team?: No Does patient have Intensive In-House Services?  : No Does patient have [facility] services? : Unknown Does patient have P4CC services?: Unknown  Past Medical History:  Past Medical History:  Diagnosis Date  . Bilateral carpal tunnel syndrome 07/27/14  . Chronic low back pain 07/29/14  . Degenerative arthritis of lumbar spine 07/27/14  . Depression   . Esophageal spasm 07/29/14  . GERD (gastroesophageal reflux disease)   . Hyperlipemia 07/29/14  . Hypertension   . Spinal stenosis 07/29/14    Past Surgical History:  Procedure Laterality Date  . HIP SURGERY Right   . INNER EAR SURGERY Right   . REPLACEMENT TOTAL KNEE BILATERAL Bilateral 07/27/14   Family History:  Family History  Family history unknown: Yes   Family Psychiatric  History: Anxiety and depression Social History:  Social History   Substance and Sexual Activity  Alcohol Use No  . Alcohol/week: 0.0 oz     Social History   Substance and Sexual Activity  Drug Use No    Social History   Socioeconomic History  . Marital status: Widowed    Spouse name: None  . Number of children: None  . Years of education: None  . Highest education level: None  Social Needs  . Financial resource strain: None  . Food insecurity - worry: None  . Food insecurity - inability: None  . Transportation needs - medical: None  . Transportation needs - non-medical: None  Occupational History  . None  Tobacco Use  . Smoking status: Former Smoker    Types: Cigarettes    Last attempt to quit: 07/29/1975    Years since quitting: 41.5  . Smokeless  tobacco: Never Used  Substance and Sexual Activity  . Alcohol use: No    Alcohol/week: 0.0 oz  . Drug use: No  . Sexual activity: No    Comment: postmenopause  Other Topics Concern  . None  Social History Narrative  . None   Additional Social History:    Allergies:   Allergies  Allergen Reactions  . Asa [Aspirin] Other (See Comments)    Unknown reaction  . Sulfa Antibiotics Other (See Comments)    Unknown reaction    Labs: No results found for this or any previous visit (from the past 48 hour(s)).  Current Facility-Administered Medications  Medication Dose Route Frequency Provider Last Rate Last Dose  . acetaminophen (TYLENOL) tablet 650 mg  650 mg Oral Q8H PRN Jene EveryKinner, Robert, MD   650 mg at 02/11/17 1640  . cephALEXin (KEFLEX) capsule 500 mg  500 mg Oral Q12H Sharyn CreamerQuale, Mark, MD   500 mg at 02/13/17 1127  . feeding supplement (ENSURE ENLIVE) (ENSURE ENLIVE) liquid 237 mL  237 mL Oral TID BM Clapacs, John T, MD   237 mL at 02/13/17 1128  . losartan (COZAAR) tablet 25 mg  25 mg Oral Daily Irean HongSung, Jade J, MD   25 mg at 02/13/17 1127  . mirtazapine (REMERON SOL-TAB) disintegrating tablet 45 mg  45 mg Oral QHS Willy Eddyobinson, Patrick, MD   45 mg at 02/12/17 2237  .  OLANZapine (ZYPREXA) injection 10 mg  10 mg Intramuscular BID PRN Irean Hong, MD   10 mg at 02/11/17 0208  . OLANZapine (ZYPREXA) tablet 10 mg  10 mg Oral BID Irean Hong, MD   10 mg at 02/13/17 1126  . pantoprazole (PROTONIX) EC tablet 40 mg  40 mg Oral Daily Irean Hong, MD   40 mg at 02/13/17 1128  . sertraline (ZOLOFT) tablet 200 mg  200 mg Oral Daily Clapacs, John T, MD   200 mg at 02/13/17 1126  . simvastatin (ZOCOR) tablet 40 mg  40 mg Oral QHS Irean Hong, MD   40 mg at 02/12/17 2135   Current Outpatient Medications  Medication Sig Dispense Refill  . acetaminophen-codeine (TYLENOL #3) 300-30 MG per tablet Take 1 tablet by mouth every 8 (eight) hours as needed for moderate pain or severe pain.    Marland Kitchen losartan (COZAAR)  25 MG tablet Take 1 tablet (25 mg total) by mouth daily. 30 tablet 0  . omeprazole (PRILOSEC) 20 MG capsule Take 20 mg by mouth 2 (two) times daily before a meal.     . simvastatin (ZOCOR) 40 MG tablet Take 40 mg by mouth at bedtime.     . hydrOXYzine (ATARAX/VISTARIL) 50 MG tablet Take 1 tablet (50 mg total) by mouth 3 (three) times daily as needed for anxiety. (Patient not taking: Reported on 02/12/2017) 30 tablet 0  . mirtazapine (REMERON) 45 MG tablet Take 1 tablet (45 mg total) by mouth at bedtime. (Patient not taking: Reported on 02/12/2017) 30 tablet 6  . OLANZapine (ZYPREXA) 20 MG tablet Take 1 tablet (20 mg total) by mouth at bedtime. (Patient not taking: Reported on 02/12/2017) 30 tablet 6  . potassium chloride SA (K-DUR,KLOR-CON) 20 MEQ tablet Take 20 mEq by mouth daily.    . QUEtiapine (SEROQUEL) 300 MG tablet Take 1 tablet (300 mg total) by mouth at bedtime. (Patient not taking: Reported on 02/12/2017) 30 tablet 6  . sertraline (ZOLOFT) 100 MG tablet Take 2 tablets (200 mg total) by mouth daily. 2 tabs in the am (Patient not taking: Reported on 02/12/2017) 180 tablet 6   Facility-Administered Medications Ordered in Other Encounters  Medication Dose Route Frequency Provider Last Rate Last Dose  . labetalol (NORMODYNE,TRANDATE) injection 20 mg  20 mg Intravenous Once Clapacs, John T, MD      . labetalol (NORMODYNE,TRANDATE) injection 20 mg  20 mg Intravenous Once Clapacs, John T, MD      . lactated ringers infusion   Intravenous Continuous Clapacs, John T, MD      . methohexital Sodium 60 mg  60 mg Intravenous Once Clapacs, John T, MD      . methohexital Sodium 60 mg  60 mg Intravenous Once Clapacs, John T, MD      . succinylcholine (ANECTINE) injection 80 mg  80 mg Intravenous Once Clapacs, John T, MD      . succinylcholine (ANECTINE) injection 80 mg  80 mg Intravenous Once Clapacs, Jackquline Denmark, MD        Musculoskeletal: Strength & Muscle Tone: within normal limits Gait & Station:  unsteady Patient leans: N/A  Psychiatric Specialty Exam: Physical Exam  Nursing note and vitals reviewed. Constitutional: She appears well-developed and well-nourished.  HENT:  Head: Normocephalic and atraumatic.  Eyes: Conjunctivae are normal. Pupils are equal, round, and reactive to light.  Neck: Normal range of motion.  Cardiovascular: Regular rhythm and normal heart sounds.  Respiratory: Effort normal. No respiratory distress.  GI: Soft.  Musculoskeletal: Normal range of motion.  Neurological: She is alert.  Skin: Skin is warm and dry.  Psychiatric: Her mood appears anxious. Her affect is blunt. Her speech is delayed. She is slowed. Thought content is paranoid. Cognition and memory are impaired. She expresses inappropriate judgment. She expresses no homicidal and no suicidal ideation. She exhibits abnormal recent memory.    Review of Systems  Constitutional: Negative.   HENT: Negative.   Eyes: Negative.   Respiratory: Negative.   Cardiovascular: Negative.   Gastrointestinal: Negative.   Musculoskeletal: Negative.   Skin: Negative.   Neurological: Negative.   Psychiatric/Behavioral: Positive for memory loss. Negative for depression, hallucinations, substance abuse and suicidal ideas. The patient is nervous/anxious. The patient does not have insomnia.     Blood pressure (!) 142/72, pulse 82, temperature 97.8 F (36.6 C), temperature source Oral, resp. rate 18, weight 69.9 kg (154 lb), SpO2 96 %.Body mass index is 28.17 kg/m.  General Appearance: Disheveled  Eye Contact:  Fair  Speech:  Garbled and Slow  Volume:  Decreased  Mood:  Dysphoric  Affect:  Congruent  Thought Process:  Disorganized  Orientation:  Negative  Thought Content:  Illogical and Rumination  Suicidal Thoughts:  No  Homicidal Thoughts:  No  Memory:  Immediate;   Fair Recent;   Poor Remote;   Poor  Judgement:  Impaired  Insight:  Shallow  Psychomotor Activity:  Decreased  Concentration:   Concentration: Poor  Recall:  Poor  Fund of Knowledge:  Poor  Language:  Poor  Akathisia:  No  Handed:  Right  AIMS (if indicated):     Assets:  Financial Resources/Insurance Housing Resilience Social Support  ADL's:  Impaired  Cognition:  Impaired,  Mild  Sleep:        Treatment Plan Summary: Daily contact with patient to assess and evaluate symptoms and progress in treatment, Medication management and Plan Patient has shown a little bit of improvement I think although she is still not appropriate for admission to our unit because of the need for assistance with toileting dressing eating etc. as well as the high fall risk.  Patient is being referred to geropsychiatry beds.  I spent some time talking with her and tried to be reassuring and educational.  Patient is taking her medication and has no obvious side effects.  I updated her husband yesterday about the plan and we will continue with trying to get her into appropriate care as soon as possible.  Disposition: Recommend psychiatric Inpatient admission when medically cleared. Supportive therapy provided about ongoing stressors.  Mordecai RasmussenJohn Clapacs, MD 02/13/2017 3:51 PM

## 2017-02-13 NOTE — ED Notes (Signed)

## 2017-02-13 NOTE — BH Assessment (Signed)
[facility](Grace [phone]) to notify them that pt will be transported in the morning (02/14/17). Was informed that bed will be held for 24 hours.  Updated ER secretary Lewie Loron(Emilie).  [address] [place], [address]

## 2017-02-13 NOTE — ED Notes (Signed)
Patient sitting in bed eating lunch at this time.

## 2017-02-13 NOTE — BH Assessment (Signed)
[facility] has been [facility].  Patient assigned to Geriatric Unit Accepting physician is Dr. Merilyn BabaUreh Lekwauwa.  Call report to [phone].  Representative was Dana Corporationracie.  ER Staff is aware of it Lewie Loron(Emilie, ER Sect.; Dr. Mayford KnifeWilliams, ER MD & Amy T., Patient's Nurse)     Writer called and left a HIPPA Compliant message with husband [phone]([phone]), requesting a return phone call.  Bed is available now.

## 2017-02-13 NOTE — ED Notes (Addendum)
Pt daughter called to receive update. Password provided to this RN to receive information. This RN updated daughter that pt will be transported [place] [facility] tomorrow.

## 2017-02-13 NOTE — ED Notes (Signed)
BEHAVIORAL HEALTH ROUNDING Patient sleeping: No. Patient alert and oriented: yes Behavior appropriate: Yes.  ; If no, describe:  Nutrition and fluids offered: yes Toileting and hygiene offered: Yes  Sitter present: q15 minute observations and security  monitoring Law enforcement present: Yes  ODS  

## 2017-02-13 NOTE — ED Notes (Signed)
Patient observed lying in bed with eyes closed  Even, unlabored respirations observed   NAD pt appears to be sleeping  I will continue to monitor along with every 15 minute visual observations and ongoing security monitoring    

## 2017-02-13 NOTE — ED Notes (Signed)
ED Is the patient under IVC or is there intent for IVC: Yes.   Is the patient medically cleared: Yes.   Is there vacancy in the ED BHU: Yes.   Is the population mix appropriate for patient: geriatric Is the patient awaiting placement in inpatient or outpatient setting: Yes.   Has the patient had a psychiatric consult: Yes - referral for inpt admission Survey of unit performed for contraband, proper placement and condition of furniture, tampering with fixtures in bathroom, shower, and each patient room: Yes.  ; Findings:  APPEARANCE/BEHAVIOR Calm and cooperative NEURO ASSESSMENT Orientation: oriented to self   Denies pain Hallucinations: No.None noted (Hallucinations) Speech: Normal Gait: normal - unsteady at times  RESPIRATORY ASSESSMENT Even  Unlabored respirations  CARDIOVASCULAR ASSESSMENT Pulses equal   regular rate  Skin warm and dry   GASTROINTESTINAL ASSESSMENT no GI complaint EXTREMITIES Full ROM  PLAN OF CARE Provide calm/safe environment. Vital signs assessed twice daily. ED BHU Assessment once each 12-hour shift. Collaborate with TTS daily  or as condition indicates. Assure the ED provider has rounded once each shift. Provide and encourage hygiene. Provide redirection as needed. Assess for escalating behavior; address immediately and inform ED provider.  Assess family dynamic and appropriateness for visitation as needed: Yes.  ; If necessary, describe findings:  Educate the patient/family about BHU procedures/visitation: Yes.  ; If necessary, describe findings:

## 2017-02-13 NOTE — BH Assessment (Signed)
Writer returned husbands phone call to update him but was unable to reach him.    Writer called and left a HIPPA Compliant message with husband [phone]([phone]), requesting a return phone call.  Husband called Clinical research associatewriter back and was updated on patient's disposition.

## 2017-02-13 NOTE — ED Notes (Addendum)
Called dietary again to receive her ensure - I was told it was being sent   Supper tray provided - pt states  "I can't eat that - get my shake - I will drink that"

## 2017-02-13 NOTE — ED Notes (Signed)
Called dietary for ensure. Brought by orderly

## 2017-02-13 NOTE — ED Notes (Signed)
Called dietary to send me her ensure  She is sitting up in bed - alert

## 2017-02-13 NOTE — BH Assessment (Signed)
  Follow up on geriatric referral placements:  [facility] ([phone]) Declined due to health barriers.   [facility] (Cedric- [phone]) Beds available, information was requested again   [facility]( Barbara - [phone]) No bed available currently.  [facility] [phone]) Unable to reach anyone.  [facility]( Grace - [phone]) Requested information fax; EKG, vitals, and progress notes.

## 2017-02-13 NOTE — BH Assessment (Signed)
Writer received phone call from patient's husband ([phone]) and was able to update him on patient [facility]. Husband mentioned he wanted to ride with the patient when [place] [facility] and Clinical research associatewriter told him he doubt he will be allowed to ride in the same car.

## 2017-02-14 DIAGNOSIS — F23 Brief psychotic disorder: Secondary | ICD-10-CM | POA: Diagnosis not present

## 2017-02-14 NOTE — ED Notes (Signed)
Pt's husband Armanda HeritageRoy Santagata [phone] notified of [facility]'s transfer [facility].

## 2017-02-14 NOTE — ED Notes (Signed)
ED Provider at bedside. 

## 2017-02-14 NOTE — ED Notes (Signed)
Pt assisted to the bathroom by Sunny SchleinFelicia, EDT. Pt noted to be a 1 person assist.

## 2017-02-14 NOTE — ED Notes (Addendum)
Unable to obtain E-sig from patient due to patient current status. [facility] Upon arrival to transport vehicle pt got in then began to say she was scared and began to start saying she did something wrong and became upset. This RN and officer Manson PasseyBrown able to calm patient down verbally, pt allowed herself to be buckled in to the vehicle, pt notably more calm upon leaving in [facility] vehicle.

## 2017-02-14 NOTE — ED Notes (Signed)
Patient refused breakfast 

## 2017-02-14 NOTE — ED Notes (Signed)
[facility] asked for report to be called at time of transport, not before. This RN unable to give report.

## 2017-02-14 NOTE — ED Notes (Signed)
Introduced my self to patient was dried and given ensure .

## 2017-02-14 NOTE — BH Assessment (Signed)
[facility] ([facility] and bed is still available for patient. Writer updated ER [facility] Misty Stanley(Lisa).

## 2017-02-14 NOTE — ED Notes (Signed)
Report given to Waynetta SandyBeth, Charity fundraiserN at [facility].

## 2017-02-14 NOTE — ED Provider Notes (Signed)
-----------------------------------------   6:59 AM on 02/14/2017 -----------------------------------------   Blood pressure (!) 142/72, pulse 82, temperature 97.8 F (36.6 C), temperature source Oral, resp. rate 18, weight 69.9 kg (154 lb), SpO2 96 %.  The patient had no acute events since last update.  Calm and cooperative at this time.  [facility], will need updated EMTALA documentation.     Loleta RoseForbach, Elayah Klooster, MD 02/14/17 0700

## 2017-02-14 NOTE — ED Notes (Signed)
Attempted to call patient's husband back per his request [phone].

## 2017-03-29 ENCOUNTER — Other Ambulatory Visit: Payer: Self-pay

## 2017-03-29 ENCOUNTER — Encounter: Payer: Self-pay | Admitting: Psychiatry

## 2017-03-29 ENCOUNTER — Ambulatory Visit (INDEPENDENT_AMBULATORY_CARE_PROVIDER_SITE_OTHER): Payer: Medicare Other | Admitting: Psychiatry

## 2017-03-29 VITALS — BP 142/80 | HR 102 | Wt 145.8 lb

## 2017-03-29 DIAGNOSIS — F333 Major depressive disorder, recurrent, severe with psychotic symptoms: Secondary | ICD-10-CM | POA: Diagnosis not present

## 2017-03-29 DIAGNOSIS — F3160 Bipolar disorder, current episode mixed, unspecified: Secondary | ICD-10-CM

## 2017-03-29 MED ORDER — VENLAFAXINE HCL ER 150 MG PO CP24: 150.0000 mg | ORAL_CAPSULE | Freq: Every day | ORAL | 3 refills | Status: DC

## 2017-03-29 MED ORDER — LOXAPINE SUCCINATE 25 MG PO CAPS: 25.0000 mg | ORAL_CAPSULE | Freq: Every day | ORAL | 3 refills | Status: DC

## 2017-03-29 MED ORDER — MIRTAZAPINE 45 MG PO TABS: 45.0000 mg | ORAL_TABLET | Freq: Every day | ORAL | 6 refills | Status: DC

## 2017-03-29 NOTE — Progress Notes (Signed)
Patient seen and her husband was here as well.  This is the first time I have met with the patient and months.  She is coming here for follow-up after being at [facility] recently.  She was discharged in the middle of December.  It appears that they have changed her medicines around and also that she received 8 ECT treatments.  The patient presents neatly dressed but complaining of her pain.  Her affect seemed a little more irritable than usual.  She snapped at me a couple of x4 things that did not make much sense.  Husband reports that she has been reasonably stable he does what he can to keep her calm.  No evidence of acute suicidality.  She still has some disorganized thinking and tends to ruminate on her belief that people have called her a "whore" in the past.  Neatly dressed.  Appears to be in about the usual physical condition.  Oriented to our situation for the most part although still a little confused.  I reviewed her new medicines which are loxapine, Effexor and Remeron.  For now I will continue those and prescribe them to her pharmacy.  I really think getting back into maintenance ECT would be the most beneficial thing.  Patient is tentatively agreeing to a plan to come in next Wednesday.  I will discuss this with the ECT team.  Otherwise we can make a follow-up appointment here in 3 months.

## 2017-04-01 ENCOUNTER — Encounter: Payer: Self-pay | Admitting: Emergency Medicine

## 2017-04-01 ENCOUNTER — Emergency Department
Admission: EM | Admit: 2017-04-01 | Discharge: 2017-04-01 | Disposition: A | Discharge status: Home / Self Care | Payer: Medicare Other | Attending: Emergency Medicine | Admitting: Emergency Medicine

## 2017-04-01 DIAGNOSIS — M549 Dorsalgia, unspecified: Secondary | ICD-10-CM | POA: Insufficient documentation

## 2017-04-01 DIAGNOSIS — Z5321 Procedure and treatment not carried out due to patient leaving prior to being seen by health care provider: Secondary | ICD-10-CM | POA: Diagnosis not present

## 2017-04-01 NOTE — ED Triage Notes (Signed)
Patient has chronic back pain but she fell in the bathtub last week and her pain has been unbearable and she is seeking relief tonight.  Family reports patient has dementia hx.  Pt moaning in pain at this time.

## 2017-04-02 ENCOUNTER — Telehealth: Payer: Self-pay | Admitting: Emergency Medicine

## 2017-04-02 NOTE — Telephone Encounter (Addendum)
Called patient due to lwot to inquire about condition and follow up plans. Left message.   Caregiver called back.  He says patient is going to pcp this afternoon

## 2017-04-03 ENCOUNTER — Telehealth: Payer: Self-pay

## 2017-04-03 NOTE — Telephone Encounter (Signed)
pt had said at her vision on  03-29-17 that she had fallen and hurt her back. pt stated at the appt that she was in pain. pt was advised to let pcp know and that they might want to do a xray. pt stated that she had appt with her pcp on  04-02-17. pt was told that I would call her on Tuesday to see is she was doing any better.  So I called pt and spoke with her husband. He stated that they did go to the doctor and that he is suppose to call the orthopedic doctor tomorrow to set up an appt for her to be seen.  He also stated that she was still in pain but that it was not as bad as it had been.

## 2017-04-04 ENCOUNTER — Telehealth: Payer: Self-pay

## 2017-04-04 ENCOUNTER — Other Ambulatory Visit: Payer: Self-pay | Admitting: Psychiatry

## 2017-04-05 ENCOUNTER — Other Ambulatory Visit: Payer: Self-pay | Admitting: Psychiatry

## 2017-04-06 ENCOUNTER — Encounter: Payer: Self-pay | Admitting: Anesthesiology

## 2017-04-06 ENCOUNTER — Encounter
Admission: RE | Admit: 2017-04-06 | Discharge: 2017-04-06 | Disposition: A | Discharge status: Home / Self Care | Payer: Medicare Other | Source: Ambulatory Visit | Attending: Psychiatry | Admitting: Psychiatry

## 2017-04-06 DIAGNOSIS — F333 Major depressive disorder, recurrent, severe with psychotic symptoms: Secondary | ICD-10-CM | POA: Insufficient documentation

## 2017-04-06 LAB — BASIC METABOLIC PANEL
Anion gap: 9 (ref 5–15)
BUN: 22 mg/dL — AB (ref 6–20)
CALCIUM: 9 mg/dL (ref 8.9–10.3)
CO2: 26 mmol/L (ref 22–32)
Chloride: 101 mmol/L (ref 101–111)
Creatinine, Ser: 0.7 mg/dL (ref 0.44–1.00)
GFR calc Af Amer: >60 mL/min (ref >60)
GLUCOSE: 102 mg/dL — AB (ref 65–99)
POTASSIUM: 4.2 mmol/L (ref 3.5–5.1)
SODIUM: 136 mmol/L (ref 135–145)

## 2017-04-06 LAB — CBC
HCT: 31.5 % — ABNORMAL LOW (ref 35.0–47.0)
Hemoglobin: 10.6 g/dL — ABNORMAL LOW (ref 12.0–16.0)
MCH: 32.4 pg (ref 26.0–34.0)
MCHC: 33.8 g/dL (ref 32.0–36.0)
MCV: 95.8 fL (ref 80.0–100.0)
Platelets: 278 10*3/uL (ref 150–440)
RBC: 3.29 MIL/uL — ABNORMAL LOW (ref 3.80–5.20)
RDW: 16.9 % — AB (ref 11.5–14.5)
WBC: 6.2 10*3/uL (ref 3.6–11.0)

## 2017-04-06 MED ORDER — SUCCINYLCHOLINE CHLORIDE 200 MG/10ML IV SOSY
PREFILLED_SYRINGE | INTRAVENOUS | Status: DC | PRN
  Administered 2017-04-06: 80 mg via INTRAVENOUS

## 2017-04-06 MED ORDER — LABETALOL HCL 5 MG/ML IV SOLN
INTRAVENOUS | Status: DC | PRN
  Administered 2017-04-06: 20 mg via INTRAVENOUS

## 2017-04-06 MED ORDER — LABETALOL HCL 5 MG/ML IV SOLN
INTRAVENOUS | Status: AC
  Filled 2017-04-06: qty 4

## 2017-04-06 MED ORDER — SODIUM CHLORIDE 0.9 % IV SOLN
INTRAVENOUS | Status: DC | PRN
Start: 2017-04-06 — End: 2017-04-06
  Administered 2017-04-06: 11:00:00 via INTRAVENOUS

## 2017-04-06 MED ORDER — KETOROLAC TROMETHAMINE 30 MG/ML IJ SOLN
INTRAMUSCULAR | Status: AC
  Administered 2017-04-06: 30 mg via INTRAVENOUS
  Filled 2017-04-06: qty 1

## 2017-04-06 MED ORDER — SUCCINYLCHOLINE CHLORIDE 20 MG/ML IJ SOLN
INTRAMUSCULAR | Status: AC
  Filled 2017-04-06: qty 1

## 2017-04-06 MED ORDER — KETOROLAC TROMETHAMINE 30 MG/ML IJ SOLN
30.0000 mg | Freq: Once | INTRAMUSCULAR | Status: AC
  Administered 2017-04-06: 30 mg via INTRAVENOUS

## 2017-04-06 MED ORDER — SODIUM CHLORIDE 0.9 % IV SOLN
500.0000 mL | Freq: Once | INTRAVENOUS | Status: AC
  Administered 2017-04-06: 500 mL via INTRAVENOUS

## 2017-04-06 MED ORDER — METHOHEXITAL SODIUM 100 MG/10ML IV SOSY
PREFILLED_SYRINGE | INTRAVENOUS | Status: DC | PRN
  Administered 2017-04-06: 60 mg via INTRAVENOUS

## 2017-04-06 NOTE — Anesthesia Post-op Follow-up Note (Signed)
Anesthesia QCDR form completed.        

## 2017-04-06 NOTE — Anesthesia Postprocedure Evaluation (Signed)
Anesthesia Post Note  Patient: Jenna Dudley  Procedure(s) Performed: ECT TX  Patient location during evaluation: PACU Anesthesia Type: General Level of consciousness: awake and alert Pain management: pain level controlled Vital Signs Assessment: post-procedure vital signs reviewed and stable Respiratory status: spontaneous breathing, nonlabored ventilation, respiratory function stable and patient connected to nasal cannula oxygen Cardiovascular status: blood pressure returned to baseline and stable Postop Assessment: no apparent nausea or vomiting Anesthetic complications: no     Last Vitals:  Vitals:   04/06/17 1120 04/06/17 1139  BP: (!) 145/78 (!) 141/69  Pulse: 87 91  Resp: 18 16  Temp:    SpO2: 94%     Last Pain:  Vitals:   04/06/17 1139  TempSrc:   PainSc: 0-No pain                 [name] S

## 2017-04-06 NOTE — Anesthesia Preprocedure Evaluation (Addendum)
Anesthesia Evaluation  Patient identified by MRN, date of birth, ID band Patient awake    Reviewed: Allergy & Precautions, NPO status , Patient's Chart, lab work & pertinent test results, reviewed documented beta blocker date and time   Airway Mallampati: II  TM Distance: >3 FB     Dental  (+) Chipped   Pulmonary former smoker,           Cardiovascular hypertension, Pt. on medications      Neuro/Psych PSYCHIATRIC DISORDERS Depression  Neuromuscular disease    GI/Hepatic GERD  Controlled,  Endo/Other    Renal/GU Renal disease     Musculoskeletal  (+) Arthritis ,   Abdominal   Peds  Hematology   Anesthesia Other Findings No cardiac symptoms.  Reproductive/Obstetrics                            Anesthesia Physical Anesthesia Plan  ASA: III  Anesthesia Plan: General   Post-op Pain Management:    Induction: Intravenous  PONV Risk Score and Plan:   Airway Management Planned:   Additional Equipment:   Intra-op Plan:   Post-operative Plan:   Informed Consent: I have reviewed the patients History and Physical, chart, labs and discussed the procedure including the risks, benefits and alternatives for the proposed anesthesia with the patient or authorized representative who has indicated his/her understanding and acceptance.     Plan Discussed with: CRNA  Anesthesia Plan Comments:         Anesthesia Quick Evaluation

## 2017-04-06 NOTE — Anesthesia Procedure Notes (Signed)
Date/Time: 04/06/2017 10:51 AM Performed by: Lily KocherPeralta, Reylene Stauder, CRNA Pre-anesthesia Checklist: Patient identified, Emergency Drugs available, Suction available and Patient being monitored Patient Re-evaluated:Patient Re-evaluated prior to induction Oxygen Delivery Method: Circle system utilized Preoxygenation: Pre-oxygenation with 100% oxygen Induction Type: IV induction Ventilation: Mask ventilation without difficulty and Mask ventilation throughout procedure Airway Equipment and Method: Bite block Placement Confirmation: positive ETCO2 Dental Injury: Teeth and Oropharynx as per pre-operative assessment

## 2017-04-06 NOTE — Transfer of Care (Signed)
Immediate Anesthesia Transfer of Care Note  Patient: Jenna Dudley  Procedure(s) Performed: ECT TX  Patient Location: PACU  Anesthesia Type:General  Level of Consciousness: sedated  Airway & Oxygen Therapy: Patient Spontanous Breathing and Patient connected to face mask oxygen  Post-op Assessment: Report given to RN and Post -op Vital signs reviewed and stable  Post vital signs: Reviewed and stable  Last Vitals:  Vitals:   04/06/17 0824 04/06/17 1100  BP: (!) 158/68 (!) (P) 141/54  Pulse: 96 (P) 88  Resp:  (P) 20  Temp: 36.7 C (P) 37.2 C  SpO2: 99% (P) 100%    Last Pain:  Vitals:   04/06/17 0824  TempSrc: Oral         Complications: No apparent anesthesia complications

## 2017-04-06 NOTE — H&P (Signed)
Jenna Dudley is an 82 y.o. female.   Chief Complaint: Patient has been having more anxiety depression and more confusion recently HPI: History of recurrent severe psychotic depression  Past Medical History:  Diagnosis Date  . Bilateral carpal tunnel syndrome 07/27/14  . Chronic low back pain 07/29/14  . Degenerative arthritis of lumbar spine 07/27/14  . Depression   . Esophageal spasm 07/29/14  . GERD (gastroesophageal reflux disease)   . Hyperlipemia 07/29/14  . Hypertension   . Spinal stenosis 07/29/14    Past Surgical History:  Procedure Laterality Date  . HIP SURGERY Right   . INNER EAR SURGERY Right   . REPLACEMENT TOTAL KNEE BILATERAL Bilateral 07/27/14    Family History  Family history unknown: Yes   Social History:  reports that she quit smoking about 41 years ago. Her smoking use included cigarettes. she has never used smokeless tobacco. She reports that she does not drink alcohol or use drugs.  Allergies:  Allergies  Allergen Reactions  . Asa [Aspirin] Other (See Comments)    Unknown reaction  . Sulfa Antibiotics Other (See Comments)    Unknown reaction     (Not in a hospital admission)  Results for orders placed or performed during the hospital encounter of 04/06/17 (from the past 48 hour(s))  Basic metabolic panel     Status: Abnormal   Collection Time: 04/06/17  8:42 AM  Result Value Ref Range   Sodium 136 135 - 145 mmol/L   Potassium 4.2 3.5 - 5.1 mmol/L   Chloride 101 101 - 111 mmol/L   CO2 26 22 - 32 mmol/L   Glucose, Bld 102 (H) 65 - 99 mg/dL   BUN 22 (H) 6 - 20 mg/dL   Creatinine, Ser 0.70 0.44 - 1.00 mg/dL   Calcium 9.0 8.9 - 10.3 mg/dL   GFR calc non Af Amer >60 >60 mL/min   GFR calc Af Amer >60 >60 mL/min    Comment: (NOTE) The eGFR has been calculated using the CKD EPI equation. This calculation has not been validated in all clinical situations. eGFR's persistently <60 mL/min signify possible Chronic Kidney Disease.    Anion gap 9 5 - 15   Comment: Performed at [facility], [address] [place], [place] [address]  CBC     Status: Abnormal   Collection Time: 04/06/17  8:42 AM  Result Value Ref Range   WBC 6.2 3.6 - 11.0 K/uL   RBC 3.29 (L) 3.80 - 5.20 MIL/uL   Hemoglobin 10.6 (L) 12.0 - 16.0 g/dL   HCT 31.5 (L) 35.0 - 47.0 %   MCV 95.8 80.0 - 100.0 fL   MCH 32.4 26.0 - 34.0 pg   MCHC 33.8 32.0 - 36.0 g/dL   RDW 16.9 (H) 11.5 - 14.5 %   Platelets 278 150 - 440 K/uL    Comment: Performed at [facility], [address], [place], [place] [address]   No results found.  Review of Systems  Constitutional: Negative.   HENT: Negative.   Eyes: Negative.   Respiratory: Negative.   Cardiovascular: Negative.   Gastrointestinal: Negative.   Musculoskeletal: Negative.   Skin: Negative.   Neurological: Negative.   Psychiatric/Behavioral: Positive for depression. Negative for hallucinations, memory loss, substance abuse and suicidal ideas. The patient is nervous/anxious. The patient does not have insomnia.     Blood pressure (!) 158/68, pulse 96, temperature 98.1 F (36.7 C), temperature source Oral, height _0  (1.626 m), weight 73.9 kg (  163 lb), SpO2 99 %. Physical Exam  Nursing note and vitals reviewed. Constitutional: She appears well-developed and well-nourished.  HENT:  Head: Normocephalic and atraumatic.  Eyes: Conjunctivae are normal. Pupils are equal, round, and reactive to light.  Neck: Normal range of motion.  Cardiovascular: Normal heart sounds.  Respiratory: Effort normal.  GI: Soft.  Musculoskeletal: Normal range of motion.  Neurological: She is alert.  Skin: Skin is warm and dry.  Psychiatric: Judgment normal. Her mood appears anxious. Her affect is blunt. Her speech is delayed. She is slowed. Thought content is not paranoid. Cognition and memory are impaired. She expresses no homicidal and no suicidal ideation.     Assessment/Plan We are going to try to get her back into at least  intermittent maintenance ECT as part of her recovery.  Treatment today and then probably follow-up in 3 weeks  Alethia Berthold, MD 04/06/2017, 10:43 AM

## 2017-04-06 NOTE — Procedures (Signed)
ECT SERVICES Physician's Interval Evaluation & Treatment Note  Patient Identification: Jenna DaftJoan M Ryner MRN:  [id number] Date of Evaluation:  04/06/2017 TX #: 1  MADRS:    MMSE: 27  P.E. Findings:  Patient's heart and lungs are normal.  Vitals unremarkable.  Chronic back pain.  Psychiatric Interval Note:  Flat down depressed but not as agitated nor as confused as she was in the office  Subjective:  Patient is a 82 y.o. female seen for evaluation for Electroconvulsive Therapy. Anxious mostly.  Treatment Summary:   [x]   Right Unilateral             []  Bilateral   % Energy : 0.3 ms, 100%   Impedance: 1680 ohms  Seizure Energy Index: 6027 V squared  Postictal Suppression Index: 79%   Seizure Concordance Index: 96%  Medications  Pre Shock: Toradol 30 mg, labetalol 20 mg, Brevital 60 mg, succinylcholine 80 mg  Post Shock:    Seizure Duration: 31 seconds by EMG 69 seconds by EEG   Comments: Follow-up 3 weeks  Lungs:  []   Clear to auscultation               []  Other:   Heart:    [x]   Regular rhythm             []  irregular rhythm    [x]   Previous H&P reviewed, patient examined and there are NO CHANGES                 []   Previous H&P reviewed, patient examined and there are changes noted.   Mordecai RasmussenJohn Clapacs, MD 1/11/201910:44 AM

## 2017-04-06 NOTE — Discharge Instructions (Signed)
1)  The drugs that you have been given will stay in your system until tomorrow so for the       next 24 hours you should not:  A. Drive an automobile  B. Make any legal decisions  C. Drink any alcoholic beverages  2)  You may resume your regular meals upon return home.  3)  A responsible adult must take you home.  Someone should stay with you for a few          hours, then be available by phone for the remainder of the treatment day.  4)  You May experience any of the following symptoms:  Headache, Nausea and a dry mouth (due to the medications you were given),  temporary memory loss and some confusion, or sore muscles (a warm bath  should help this).  If you you experience any of these symptoms let us know on                your return visit.  5)  Report any of the following: any acute discomfort, severe headache, or temperature        greater than 100.5 F.   Also report any unusual redness, swelling, drainage, or pain         at your IV site.    You may report Symptoms to:  ECT [facility]- [place] [facility]          Phone: [phone], ECT Department           or Dr. Shary Keylapac's office [phone]  6)  Your next ECT Treatment is Wednesday January 30   We will call 2 days prior to your scheduled appointment for arrival times.  7)  Nothing to eat or drink after midnight the night before your procedure.  8)  Take     With a sip of water the morning of your procedure.  9)  Other Instructions: Call [phone] to cancel the morning of your procedure due         to illness or emergency.  10) We will call within 72 hours to assess how you are feeling.

## 2017-04-16 ENCOUNTER — Other Ambulatory Visit: Payer: Self-pay

## 2017-04-16 ENCOUNTER — Emergency Department: Payer: Medicare Other

## 2017-04-16 ENCOUNTER — Encounter: Payer: Self-pay | Admitting: Emergency Medicine

## 2017-04-16 ENCOUNTER — Emergency Department
Admission: EM | Admit: 2017-04-16 | Discharge: 2017-04-17 | Disposition: A | Discharge status: Home / Self Care | Payer: Medicare Other | Attending: Emergency Medicine | Admitting: Emergency Medicine

## 2017-04-16 DIAGNOSIS — R0602 Shortness of breath: Secondary | ICD-10-CM | POA: Insufficient documentation

## 2017-04-16 DIAGNOSIS — Z87891 Personal history of nicotine dependence: Secondary | ICD-10-CM | POA: Insufficient documentation

## 2017-04-16 DIAGNOSIS — F419 Anxiety disorder, unspecified: Secondary | ICD-10-CM | POA: Diagnosis not present

## 2017-04-16 DIAGNOSIS — Z96653 Presence of artificial knee joint, bilateral: Secondary | ICD-10-CM | POA: Insufficient documentation

## 2017-04-16 DIAGNOSIS — Z79899 Other long term (current) drug therapy: Secondary | ICD-10-CM | POA: Insufficient documentation

## 2017-04-16 DIAGNOSIS — I10 Essential (primary) hypertension: Secondary | ICD-10-CM | POA: Insufficient documentation

## 2017-04-16 DIAGNOSIS — R748 Abnormal levels of other serum enzymes: Secondary | ICD-10-CM | POA: Insufficient documentation

## 2017-04-16 DIAGNOSIS — N390 Urinary tract infection, site not specified: Secondary | ICD-10-CM | POA: Insufficient documentation

## 2017-04-16 DIAGNOSIS — R778 Other specified abnormalities of plasma proteins: Secondary | ICD-10-CM

## 2017-04-16 DIAGNOSIS — R7989 Other specified abnormal findings of blood chemistry: Secondary | ICD-10-CM

## 2017-04-16 DIAGNOSIS — Z8673 Personal history of transient ischemic attack (TIA), and cerebral infarction without residual deficits: Secondary | ICD-10-CM | POA: Diagnosis not present

## 2017-04-16 HISTORY — DX: Bipolar disorder, unspecified: F31.9

## 2017-04-16 LAB — CBC
HCT: 31.3 % — ABNORMAL LOW (ref 35.0–47.0)
Hemoglobin: 10.6 g/dL — ABNORMAL LOW (ref 12.0–16.0)
MCH: 32.6 pg (ref 26.0–34.0)
MCHC: 33.8 g/dL (ref 32.0–36.0)
MCV: 96.5 fL (ref 80.0–100.0)
PLATELETS: 251 10*3/uL (ref 150–440)
RBC: 3.25 MIL/uL — ABNORMAL LOW (ref 3.80–5.20)
RDW: 16.7 % — AB (ref 11.5–14.5)
WBC: 7.8 10*3/uL (ref 3.6–11.0)

## 2017-04-16 LAB — BASIC METABOLIC PANEL
Anion gap: 11 (ref 5–15)
BUN: 18 mg/dL (ref 6–20)
CHLORIDE: 105 mmol/L (ref 101–111)
CO2: 21 mmol/L — AB (ref 22–32)
CREATININE: 0.75 mg/dL (ref 0.44–1.00)
Calcium: 8.8 mg/dL — ABNORMAL LOW (ref 8.9–10.3)
GFR calc Af Amer: >60 mL/min (ref >60)
GFR calc non Af Amer: >60 mL/min (ref >60)
Glucose, Bld: 135 mg/dL — ABNORMAL HIGH (ref 65–99)
Potassium: 3.7 mmol/L (ref 3.5–5.1)
Sodium: 137 mmol/L (ref 135–145)

## 2017-04-16 LAB — TROPONIN I: Troponin I: 0.04 ng/mL (ref <0.03)

## 2017-04-16 MED ORDER — LOXAPINE SUCCINATE 25 MG PO CAPS
25.0000 mg | ORAL_CAPSULE | ORAL | Status: AC
  Administered 2017-04-17: 25 mg via ORAL
  Filled 2017-04-16: qty 1

## 2017-04-16 MED ORDER — MIRTAZAPINE 15 MG PO TABS
45.0000 mg | ORAL_TABLET | ORAL | Status: AC
  Administered 2017-04-17: 45 mg via ORAL
  Filled 2017-04-16: qty 3

## 2017-04-16 NOTE — ED Triage Notes (Signed)
Pt arrived to the ED accompanied by her husband for complaints of SOB. Pt's husband reports that the Pt has been experiencing shortness of breath since yesterday. EMS went to the Pt's house yesterday for the same and gave her some oxygen and the Pt felt better, then refused to come to the ED. Pt is AOx4 complaining of shortness of breath, speaking in complete sentences, O2 sats of 97% and clear lung sounds. Pt's husband reports that the Pt is bipolar and suffers of anxiety.

## 2017-04-16 NOTE — ED Notes (Signed)
Pt presents today with complaints of SOB pt husband at bedside.e Pt states it has been this way since yesterday. Pt talks in complete sentences w/o difficulty.

## 2017-04-16 NOTE — ED Provider Notes (Signed)
[facility] Emergency Department Provider Note  ____________________________________________   First MD Initiated Contact with Patient 04/16/17 2330     (approximate)  I have reviewed the triage vital signs and the nursing notes.   HISTORY  Chief Complaint Shortness of Breath  Level 5 caveat:  history/ROS limited by patient's psychiatric history and the fact that she is a vague historian.  The majority of the history is provided by her husband.   HPI Jenna Dudley is a 82 y.o. female with medical history as listed below who presents for evaluation of shortness of breath.  She has been having shortness of breath off and on for 2-3 days.  EMS was called out yesterday but the patient refused transport and has been fine since then.  She says that she felt better but then she became more short of breath tonight.  She says she is "just worried" but she cannot explain what is making her nervous or anxious.  She is speaking in complete sentences but she has some increased respiratory effort.  She is visibly anxious and frequently looks to her husband for reassurance.  She denies fever/chills, chest pain, nausea, vomiting, abdominal pain, and dysuria.  She is unable to quantify the severity of her symptoms but her husband says that they are moderate at worst and then they will resolve.  She has no history of lung disease and has not smoked for more than 30 years.   Past Medical History:  Diagnosis Date  . Bilateral carpal tunnel syndrome 07/27/14  . Bipolar disorder [facility])    geropsych admission to [facility] in Dec 2018  . Chronic low back pain 07/29/14  . Degenerative arthritis of lumbar spine 07/27/14  . Depression   . Esophageal spasm 07/29/14  . GERD (gastroesophageal reflux disease)   . Hyperlipemia 07/29/14  . Hypertension   . Spinal stenosis 07/29/14    Patient Active Problem List   Diagnosis Date Noted  . Acute encephalopathy 10/04/2015  . Sepsis (HCC) 10/03/2015   . Acute pyelonephritis 10/03/2015  . Hypernatremia 10/03/2015  . Lactic acidosis 10/03/2015  . Acute renal insufficiency 10/03/2015  . Leukocytosis 10/03/2015  . Agitation 10/03/2015  . Severe recurrent major depression without psychotic features (HCC)   . Major depressive disorder, recurrent episode, severe, with psychotic behavior (HCC) 10/13/2014  . Severe episode of recurrent major depressive disorder, with psychotic features (HCC) 10/13/2014  . 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 Infection of urinary tract 09/17/2014  . Essential hypertension 09/17/2014  . Chronic back pain 09/17/2014  . Gastric reflux 09/17/2014  . History of stroke 09/17/2014  . Hypertension 09/16/2014  . Severe recurrent major depression with psychotic features (HCC)   . Hallux abductovalgus with bunions 09/12/2013  . Hammer toe 09/12/2013  . Foot pain 09/12/2013  . Fungal infection of nail 09/12/2013  . Carpal tunnel syndrome 12/21/2010  . Chronic LBP 12/21/2010  . Degenerative arthritis of lumbar spine 12/21/2010  . Clinical depression 12/21/2010  . 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 HLD (hyperlipidemia) 12/21/2010  . BP (high blood pressure) 12/21/2010  . Acid reflux 12/21/2010  . Spinal stenosis 12/21/2010    Past Surgical History:  Procedure Laterality Date  . HIP SURGERY Right   . INNER EAR SURGERY Right   . REPLACEMENT TOTAL KNEE BILATERAL Bilateral 07/27/14    Prior to Admission medications   Medication Sig Start Date End Date Taking? Authorizing Provider  acetaminophen-codeine (TYLENOL #3) 300-30 MG per  tablet Take 1 tablet by mouth every 8 (eight) hours as needed for moderate pain or severe pain.    [provider]  cephALEXin (KEFLEX) 500 MG capsule Take 1 capsule (500 mg total) by mouth 2 (two) times daily. 04/17/17   Loleta RoseForbach, Siren Porrata, MD  hydrOXYzine (ATARAX/VISTARIL) 50 MG tablet Take 1 tablet (50 mg total) by mouth 3  (three) times daily as needed for anxiety. Patient not taking: Reported on 04/06/2017 09/26/14   Pucilowska, Ellin GoodieJolanta B, MD  losartan (COZAAR) 25 MG tablet Take 1 tablet (25 mg total) by mouth daily. 09/26/14   Pucilowska, Braulio ConteJolanta B, MD  loxapine (LOXITANE) 25 MG capsule Take 1 capsule (25 mg total) by mouth at bedtime. 03/29/17   Clapacs, Jackquline DenmarkJohn T, MD  mirtazapine (REMERON) 45 MG tablet Take 1 tablet (45 mg total) by mouth at bedtime. 03/29/17   Clapacs, Jackquline DenmarkJohn T, MD  omeprazole (PRILOSEC) 20 MG capsule Take 20 mg by mouth 2 (two) times daily before a meal.     [provider]  potassium chloride SA (K-DUR,KLOR-CON) 20 MEQ tablet Take 20 mEq by mouth daily.    [provider]  simvastatin (ZOCOR) 40 MG tablet Take 40 mg by mouth at bedtime.     [provider]  venlafaxine XR (EFFEXOR-XR) 150 MG 24 hr capsule Take 1 capsule (150 mg total) by mouth daily with breakfast. 03/29/17   Clapacs, Jackquline DenmarkJohn T, MD    Allergies Asa [aspirin] and Sulfa antibiotics  Family History  Family history unknown: Yes    Social History Social History   Tobacco Use  . Smoking status: Former Smoker    Types: Cigarettes    Last attempt to quit: 07/29/1975    Years since quitting: 41.7  . Smokeless tobacco: Never Used  Substance Use Topics  . Alcohol use: No    Alcohol/week: 0.0 oz  . Drug use: No    Review of Systems  Level 5 caveat:  history/ROS limited by patient's psychiatric history and the fact that she is a vague historian.  The majority of the history is provided by her husband.  Constitutional: No fever/chills Eyes: No visual changes. ENT: No sore throat. Cardiovascular: Denies chest pain. Respiratory: +shortness of breath as described above Gastrointestinal: No abdominal pain.  No nausea, no vomiting.  No diarrhea.  No constipation. Genitourinary: Negative for dysuria. Musculoskeletal: Negative for neck pain.  Negative for back pain. Integumentary: Negative for rash. Neurological:  Negative for headaches, focal weakness or numbness. Psychiatric:Anxious, "worried"  ____________________________________________   PHYSICAL EXAM:  VITAL SIGNS: ED Triage Vitals  Enc Vitals Group     BP 04/16/17 2115 (!) 182/80     Pulse Rate 04/16/17 2115 (!) 116     Resp 04/16/17 2115 20     Temp 04/16/17 2115 97.7 F (36.5 C)     Temp Source 04/16/17 2115 Oral     SpO2 04/16/17 2114 97 %     Weight 04/16/17 2116 64.4 kg (142 lb)     Height 04/16/17 2116 1.626 m (5\' 4" )     Head Circumference --      Peak Flow --      Pain Score 04/16/17 2115 4     Pain Loc --      Pain Edu? --      Excl. in GC? --     Constitutional: Alert and oriented.  Appears to be in at least mild respiratory distress due to her increased work of breathing and is visibly anxious.  Eyes: Conjunctivae are normal.  Head: Atraumatic. Nose: No congestion/rhinnorhea. Neck: No stridor.  No meningeal signs.   Cardiovascular: Mild tachycardia, regular rhythm. Good peripheral circulation. Grossly normal heart sounds. Respiratory: Increased respiratory effort as if she is intentionally taking deeper breaths than usual but she is not retracting.  She has clear lung sounds throughout with no wheezing and no coarse breath sounds in the bases. Gastrointestinal: Soft and nontender. No distention.  Musculoskeletal: No lower extremity tenderness nor edema. No gross deformities of extremities. Neurologic:  Normal speech and language. No gross focal neurologic deficits are appreciated.  Skin:  Skin is warm, dry and intact. No rash noted. Psychiatric: Mood and affect are anxious and her affect is unusual.  Sometimes she answers questions inappropriately.  Her husband states that she seems more or less at her baseline but is more anxious than usual.  ____________________________________________   LABS (all labs ordered are listed, but only abnormal results are displayed)  Labs Reviewed  CBC - Abnormal; Notable for the  following components:      Result Value   RBC 3.25 (*)    Hemoglobin 10.6 (*)    HCT 31.3 (*)    RDW 16.7 (*)    All other components within normal limits  TROPONIN I - Abnormal; Notable for the following components:   Troponin I 0.04 (*)    All other components within normal limits  BASIC METABOLIC PANEL - Abnormal; Notable for the following components:   CO2 21 (*)    Glucose, Bld 135 (*)    Calcium 8.8 (*)    All other components within normal limits  TROPONIN I - Abnormal; Notable for the following components:   Troponin I 0.04 (*)    All other components within normal limits  HEPATIC FUNCTION PANEL - Abnormal; Notable for the following components:   ALT 12 (*)    All other components within normal limits  URINALYSIS, ROUTINE W REFLEX MICROSCOPIC - Abnormal; Notable for the following components:   Color, Urine YELLOW (*)    APPearance HAZY (*)    Hgb urine dipstick SMALL (*)    Protein, ur 30 (*)    Leukocytes, UA MODERATE (*)    Bacteria, UA RARE (*)    Squamous Epithelial / LPF 0-5 (*)    All other components within normal limits  URINE CULTURE  LIPASE, BLOOD   ____________________________________________  EKG  ED ECG REPORT I, Loleta Rose, the attending physician, personally viewed and interpreted this ECG.  Date: 04/16/2017 EKG Time: 23:35 Rate: 97 Rhythm: normal sinus rhythm QRS Axis: normal Intervals: Shortened PR interval 114 ms ST/T Wave abnormalities: Non-specific ST segment / T-wave changes, but no evidence of acute ischemia.  Tremor is present due to the patient's anxiety Narrative Interpretation: no evidence of acute ischemia   ____________________________________________  RADIOLOGY   Dg Chest 2 View  Result Date: 04/16/2017 CLINICAL DATA:  Shortness of breath EXAM: CHEST  2 VIEW COMPARISON:  02/10/2017 FINDINGS: No acute consolidation or effusion. Hyperinflation. Mild diffuse bronchitic changes. Normal heart size. Aortic atherosclerosis. No  pneumothorax. Degenerative changes of the spine. Surgical clips in the right upper quadrant. IMPRESSION: Mild bronchitic changes.  No focal pulmonary infiltrate or edema. Electronically Signed   By: Jasmine Pang M.D.   On: 04/16/2017 21:41   Ct Angio Chest Pe W/cm &/or Wo Cm  Result Date: 04/17/2017 CLINICAL DATA:  Shortness of breath. Since yesterday. Improved on home oxygen. EXAM: CT ANGIOGRAPHY CHEST WITH CONTRAST TECHNIQUE: Multidetector CT  imaging of the chest was performed using the standard protocol during bolus administration of intravenous contrast. Multiplanar CT image reconstructions and MIPs were obtained to evaluate the vascular anatomy. CONTRAST:  75mL ISOVUE-370 IOPAMIDOL (ISOVUE-370) INJECTION 76% COMPARISON:  Chest 04/16/2017 FINDINGS: Cardiovascular: Good opacification of the central and segmental pulmonary arteries. No focal filling defects. No evidence of significant pulmonary embolus. Heart size is normal. No pericardial effusion. Normal caliber thoracic aorta. Calcification in the aorta and coronary arteries. Mediastinum/Nodes: Mediastinal lymph nodes are not pathologically enlarged. Esophagus is decompressed. Small esophageal hiatal hernia. Lungs/Pleura: Mild atelectasis in the lung bases. Slight mosaic attenuation pattern to the lungs could represent air trapping or edema. Airways are patent. No pleural effusions. No pneumothorax. Upper Abdomen: Surgical absence of the gallbladder. Gas in the bile ducts is likely postoperative. Calcification in the aorta. Musculoskeletal: Degenerative changes in the spine. No destructive bone lesions. Review of the MIP images confirms the above findings. IMPRESSION: 1. No evidence of significant pulmonary embolus. 2. Mosaic attenuation pattern to the lungs could indicate air trapping or edema. 3. Small esophageal hiatal hernia. 4. Aortic atherosclerosis. Aortic Atherosclerosis (ICD10-I70.0). Electronically Signed   By: Burman Nieves M.D.   On:  04/17/2017 01:37    ____________________________________________   PROCEDURES  Critical Care performed: No   Procedure(s) performed:   Procedures   ____________________________________________   INITIAL IMPRESSION / ASSESSMENT AND PLAN / ED COURSE  As part of my medical decision making, I reviewed the following data within the electronic MEDICAL RECORD NUMBER History obtained from family, Nursing notes reviewed and incorporated, Labs reviewed , EKG interpreted , Old chart reviewed and Notes from prior ED visits    Differential includes, but is not limited to, viral syndrome, bronchitis including COPD exacerbation, pneumonia, reactive airway disease including asthma, CHF including exacerbation with or without pulmonary/interstitial edema, pneumothorax, ACS, thoracic trauma, pulmonary embolism, anxiety, etc.  I believe that anxiety is playing a component, but she has visibly increased work of breathing and a troponin of 0.04.  I will repeat her troponin and I am also going to check a CTA chest to rule out pulmonary embolism which could explain her tachycardia, shortness of breath, and anxiety and feeling of discomfort and worry.  Given her age I do not think a d-dimer would be useful in ruling out pulmonary embolism.  I have ordered her nighttime psychiatric medications which includes Remeron and Loxitane (which was recently prescribed instead of Zyprexa).  If she still remains anxious I may try 1 of her as needed Vistaril as listed on her home med list.  I discussed the plan with her husband and they both agree.  The patient very much does not want to be in the hospital and I did offer to admit her based simply on her symptoms and her slightly elevated troponin, but they both agree that if she could go home and follow-up as an outpatient in the absence of any acute or emergent condition tonight that they would prefer that.  I think that is appropriate.  Clinical Course as of Apr 17 313  Tue  Apr 17, 2017  0240 Patient is becoming increasingly agitated and begging to go home.  Repeat troponin was drawn a little bit early but was constant at 0.04 which is reassuring.  Urinalysis is grossly infected.  I updated the patient and her husband about the results.  I again offered admission but the patient's husband is very comfortable taking her home and neither 1 of [place] want to  make her stay against her will.  He made the comment again that when she gets a urinary tract infection it "makes her act really weird".  I verified with him one last time that he is comfortable and has no concerns about taking her home, and he assured me it is not a problem and that is his preference.  I gave a prescription for Keflex after giving a first dose in the ED and I gave strict follow-up and return precautions.  He and his wife understand and agree with the plan.  [CF]    Clinical Course User Index [CF] Loleta Rose, MD    ____________________________________________  FINAL CLINICAL IMPRESSION(S) / ED DIAGNOSES  Final diagnoses:  Urinary tract infection without hematuria, site unspecified  SOB (shortness of breath)  Anxiety  Elevated troponin I level     MEDICATIONS GIVEN DURING THIS VISIT:  Medications  loxapine (LOXITANE) capsule 25 mg (25 mg Oral Given 04/17/17 0156)  mirtazapine (REMERON) tablet 45 mg (45 mg Oral Given 04/17/17 0247)  iopamidol (ISOVUE-370) 76 % injection 75 mL (75 mLs Intravenous Contrast Given 04/17/17 0119)  cephALEXin (KEFLEX) capsule 500 mg (500 mg Oral Given 04/17/17 0247)     ED Discharge Orders        Ordered    cephALEXin (KEFLEX) 500 MG capsule  2 times daily     04/17/17 0241       Note:  This document was prepared using Dragon voice recognition software and may include unintentional dictation errors.    Loleta Rose, MD 04/17/17 [phone]

## 2017-04-17 ENCOUNTER — Emergency Department: Payer: Medicare Other

## 2017-04-17 ENCOUNTER — Encounter: Payer: Self-pay | Admitting: Emergency Medicine

## 2017-04-17 DIAGNOSIS — R0602 Shortness of breath: Secondary | ICD-10-CM | POA: Diagnosis not present

## 2017-04-17 LAB — URINALYSIS, ROUTINE W REFLEX MICROSCOPIC
BILIRUBIN URINE: NEGATIVE
Glucose, UA: NEGATIVE mg/dL
Ketones, ur: NEGATIVE mg/dL
NITRITE: NEGATIVE
Protein, ur: 30 mg/dL — AB
Specific Gravity, Urine: 1.023 (ref 1.005–1.030)
pH: 5 (ref 5.0–8.0)

## 2017-04-17 LAB — HEPATIC FUNCTION PANEL
ALT: 12 U/L — AB (ref 14–54)
AST: 27 U/L (ref 15–41)
Albumin: 4.2 g/dL (ref 3.5–5.0)
Alkaline Phosphatase: 78 U/L (ref 38–126)
BILIRUBIN DIRECT: 0.2 mg/dL (ref 0.1–0.5)
Indirect Bilirubin: 0.8 mg/dL (ref 0.3–0.9)
Total Bilirubin: 1 mg/dL (ref 0.3–1.2)
Total Protein: 7.5 g/dL (ref 6.5–8.1)

## 2017-04-17 LAB — LIPASE, BLOOD: Lipase: 25 U/L (ref 11–51)

## 2017-04-17 LAB — TROPONIN I: TROPONIN I: 0.04 ng/mL — AB (ref <0.03)

## 2017-04-17 MED ORDER — CEPHALEXIN 500 MG PO CAPS: 500.0000 mg | ORAL_CAPSULE | Freq: Two times a day (BID) | ORAL | 0 refills | Status: DC

## 2017-04-17 MED ORDER — CEPHALEXIN 500 MG PO CAPS
500.0000 mg | ORAL_CAPSULE | Freq: Once | ORAL | Status: AC
  Administered 2017-04-17: 500 mg via ORAL
  Filled 2017-04-17: qty 1

## 2017-04-17 MED ORDER — IOPAMIDOL (ISOVUE-370) INJECTION 76%
75.0000 mL | Freq: Once | INTRAVENOUS | Status: AC | PRN
  Administered 2017-04-17: 75 mL via INTRAVENOUS

## 2017-04-17 NOTE — Discharge Instructions (Addendum)
As we discussed, your medical workup was relatively reassuring today.  We believe your symptoms may be mostly the result of a urinary tract infection.  Please take the full course of antibiotics as prescribed.  We also recommend you follow-up with your regular doctor at the next available opportunity.  Your troponin was very slightly elevated, and even though it stayed constant over time, we recommend you follow up for another appointment and for repeat lab work.  Return to the emergency department if you develop new or worsening symptoms that concern you.

## 2017-04-17 NOTE — ED Notes (Signed)
Gave pt diet gingerale.  Pt continues to be anxious and restless.  VSS, no visible sob at this time.

## 2017-04-18 LAB — URINE CULTURE: Special Requests: NORMAL

## 2017-04-20 ENCOUNTER — Inpatient Hospital Stay
Admission: EM | Admit: 2017-04-20 | Discharge: 2017-04-23 | DRG: 292 | Disposition: A | Discharge status: Home Health | Payer: Medicare Other | Attending: Internal Medicine | Admitting: Internal Medicine

## 2017-04-20 ENCOUNTER — Emergency Department: Payer: Medicare Other

## 2017-04-20 ENCOUNTER — Encounter: Payer: Self-pay | Admitting: Emergency Medicine

## 2017-04-20 DIAGNOSIS — J810 Acute pulmonary edema: Secondary | ICD-10-CM

## 2017-04-20 DIAGNOSIS — I10 Essential (primary) hypertension: Secondary | ICD-10-CM | POA: Diagnosis present

## 2017-04-20 DIAGNOSIS — I248 Other forms of acute ischemic heart disease: Secondary | ICD-10-CM | POA: Diagnosis present

## 2017-04-20 DIAGNOSIS — R0602 Shortness of breath: Secondary | ICD-10-CM | POA: Diagnosis not present

## 2017-04-20 DIAGNOSIS — F0390 Unspecified dementia without behavioral disturbance: Secondary | ICD-10-CM | POA: Diagnosis present

## 2017-04-20 DIAGNOSIS — I110 Hypertensive heart disease with heart failure: Secondary | ICD-10-CM | POA: Diagnosis not present

## 2017-04-20 DIAGNOSIS — K219 Gastro-esophageal reflux disease without esophagitis: Secondary | ICD-10-CM | POA: Diagnosis present

## 2017-04-20 DIAGNOSIS — I509 Heart failure, unspecified: Secondary | ICD-10-CM

## 2017-04-20 DIAGNOSIS — I472 Ventricular tachycardia: Secondary | ICD-10-CM | POA: Diagnosis not present

## 2017-04-20 DIAGNOSIS — F319 Bipolar disorder, unspecified: Secondary | ICD-10-CM | POA: Diagnosis present

## 2017-04-20 DIAGNOSIS — N390 Urinary tract infection, site not specified: Secondary | ICD-10-CM | POA: Diagnosis present

## 2017-04-20 DIAGNOSIS — Z87891 Personal history of nicotine dependence: Secondary | ICD-10-CM

## 2017-04-20 DIAGNOSIS — Z8249 Family history of ischemic heart disease and other diseases of the circulatory system: Secondary | ICD-10-CM

## 2017-04-20 DIAGNOSIS — F419 Anxiety disorder, unspecified: Secondary | ICD-10-CM | POA: Diagnosis present

## 2017-04-20 DIAGNOSIS — Z96653 Presence of artificial knee joint, bilateral: Secondary | ICD-10-CM | POA: Diagnosis present

## 2017-04-20 DIAGNOSIS — E785 Hyperlipidemia, unspecified: Secondary | ICD-10-CM | POA: Diagnosis present

## 2017-04-20 DIAGNOSIS — I5033 Acute on chronic diastolic (congestive) heart failure: Secondary | ICD-10-CM | POA: Diagnosis present

## 2017-04-20 LAB — COMPREHENSIVE METABOLIC PANEL
ALK PHOS: 88 U/L (ref 38–126)
ALT: 10 U/L — AB (ref 14–54)
AST: 25 U/L (ref 15–41)
Albumin: 4.3 g/dL (ref 3.5–5.0)
Anion gap: 9 (ref 5–15)
BUN: 15 mg/dL (ref 6–20)
CALCIUM: 8.8 mg/dL — AB (ref 8.9–10.3)
CO2: 24 mmol/L (ref 22–32)
CREATININE: 0.8 mg/dL (ref 0.44–1.00)
Chloride: 101 mmol/L (ref 101–111)
GFR calc non Af Amer: >60 mL/min (ref >60)
Glucose, Bld: 153 mg/dL — ABNORMAL HIGH (ref 65–99)
Potassium: 3.8 mmol/L (ref 3.5–5.1)
SODIUM: 134 mmol/L — AB (ref 135–145)
Total Bilirubin: 0.7 mg/dL (ref 0.3–1.2)
Total Protein: 7.7 g/dL (ref 6.5–8.1)

## 2017-04-20 LAB — CBC WITH DIFFERENTIAL/PLATELET
Basophils Absolute: 0.2 10*3/uL — ABNORMAL HIGH (ref 0–0.1)
Basophils Relative: 2 %
EOS ABS: 0.3 10*3/uL (ref 0–0.7)
Eosinophils Relative: 3 %
HCT: 33.8 % — ABNORMAL LOW (ref 35.0–47.0)
Hemoglobin: 11.3 g/dL — ABNORMAL LOW (ref 12.0–16.0)
LYMPHS ABS: 1.4 10*3/uL (ref 1.0–3.6)
Lymphocytes Relative: 13 %
MCH: 32.6 pg (ref 26.0–34.0)
MCHC: 33.4 g/dL (ref 32.0–36.0)
MCV: 97.5 fL (ref 80.0–100.0)
MONO ABS: 0.5 10*3/uL (ref 0.2–0.9)
MONOS PCT: 5 %
Neutro Abs: 8.5 10*3/uL — ABNORMAL HIGH (ref 1.4–6.5)
Neutrophils Relative %: 77 %
Platelets: 263 10*3/uL (ref 150–440)
RBC: 3.46 MIL/uL — ABNORMAL LOW (ref 3.80–5.20)
RDW: 16.6 % — ABNORMAL HIGH (ref 11.5–14.5)
WBC: 10.9 10*3/uL (ref 3.6–11.0)

## 2017-04-20 LAB — TROPONIN I: Troponin I: 0.04 ng/mL (ref <0.03)

## 2017-04-20 MED ORDER — ALBUTEROL SULFATE (2.5 MG/3ML) 0.083% IN NEBU: 5.0000 mg | INHALATION_SOLUTION | Freq: Once | RESPIRATORY_TRACT | Status: DC

## 2017-04-20 MED ORDER — SODIUM CHLORIDE 0.9 % IV BOLUS (SEPSIS)
500.0000 mL | Freq: Once | INTRAVENOUS | Status: AC
  Administered 2017-04-20: 500 mL via INTRAVENOUS

## 2017-04-20 NOTE — ED Notes (Signed)
Critical troponin of 0.04 called from lab. Dr. rifenbark notified, no new orders received.  

## 2017-04-20 NOTE — ED Notes (Signed)
Pt to xray

## 2017-04-20 NOTE — ED Provider Notes (Signed)
[facility] Emergency Department Provider Note  ____________________________________________   First MD Initiated Contact with Patient 04/20/17 2301     (approximate)  I have reviewed the triage vital signs and the nursing notes.   HISTORY  Chief Complaint Shortness of Breath and Anxiety  Level 5 exemption history limited by the patient's clinical condition  HPI Jenna Dudley is a 82 y.o. female who was brought to the emergency department by her husband for increasing shortness of breath.  Shortness of breath began about 3 or 4 days ago when she was initially seen in our emergency department.  At that time she had an extensive workup including a CT angiogram of her chest which was negative and she was eventually diagnosed with a urinary tract infection and discharged home on cephalexin.  Her husband said she initially improved however gradually throughout the course of today her shortness of breath worsened and it is now moderate to severe.  Nothing in particular seems to make the patient's symptoms better or worse.  She denies cough.  She denies fevers or chills.  She denies history of cardiac or respiratory issues.  She does not use oxygen at home.  She sleeps on one pillow and is able to lie completely flat.  She denies change in her weight.  Past Medical History:  Diagnosis Date  . Bilateral carpal tunnel syndrome 07/27/14  . Bipolar disorder [facility])    geropsych admission to [facility] in Dec 2018  . Chronic low back pain 07/29/14  . Degenerative arthritis of lumbar spine 07/27/14  . Depression   . Esophageal spasm 07/29/14  . GERD (gastroesophageal reflux disease)   . Hyperlipemia 07/29/14  . Hypertension   . Spinal stenosis 07/29/14    Patient Active Problem List   Diagnosis Date Noted  . New onset of congestive heart failure (HCC) 04/21/2017  . Accelerated hypertension 04/21/2017  . Acute encephalopathy 10/04/2015  . Sepsis (HCC) 10/03/2015  . Acute  pyelonephritis 10/03/2015  . Hypernatremia 10/03/2015  . Lactic acidosis 10/03/2015  . Acute renal insufficiency 10/03/2015  . Leukocytosis 10/03/2015  . Agitation 10/03/2015  . Severe recurrent major depression without psychotic features (HCC)   . Major depressive disorder, recurrent episode, severe, with psychotic behavior (HCC) 10/13/2014  . Severe episode of recurrent major depressive disorder, with psychotic features (HCC) 10/13/2014  . 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 Infection of urinary tract 09/17/2014  . Essential hypertension 09/17/2014  . Chronic back pain 09/17/2014  . Gastric reflux 09/17/2014  . History of stroke 09/17/2014  . Severe recurrent major depression with psychotic features (HCC)   . Hallux abductovalgus with bunions 09/12/2013  . Hammer toe 09/12/2013  . Foot pain 09/12/2013  . Fungal infection of nail 09/12/2013  . Carpal tunnel syndrome 12/21/2010  . Chronic LBP 12/21/2010  . Degenerative arthritis of lumbar spine 12/21/2010  . Clinical depression 12/21/2010  . 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 HLD (hyperlipidemia) 12/21/2010  . Acid reflux 12/21/2010  . Spinal stenosis 12/21/2010    Past Surgical History:  Procedure Laterality Date  . HIP SURGERY Right   . INNER EAR SURGERY Right   . REPLACEMENT TOTAL KNEE BILATERAL Bilateral 07/27/14    Prior to Admission medications   Medication Sig Start Date End Date Taking? Authorizing Provider  acetaminophen-codeine (TYLENOL #3) 300-30 MG per tablet Take 1 tablet by mouth every 8 (eight) hours as needed for moderate pain or severe pain.   Yes [provider]  cephALEXin (KEFLEX) 500 MG capsule Take 1 capsule (500 mg total) by mouth 2 (two) times daily. 04/17/17  Yes Loleta Rose, MD  losartan (COZAAR) 25 MG tablet Take 1 tablet (25 mg total) by mouth daily. 09/26/14  Yes Pucilowska, Jolanta B, MD  loxapine (LOXITANE) 25 MG capsule Take 1  capsule (25 mg total) by mouth at bedtime. 03/29/17  Yes Clapacs, Jackquline Denmark, MD  mirtazapine (REMERON) 45 MG tablet Take 1 tablet (45 mg total) by mouth at bedtime. 03/29/17  Yes Clapacs, Jackquline Denmark, MD  omeprazole (PRILOSEC) 20 MG capsule Take 20 mg by mouth 2 (two) times daily before a meal.    Yes [provider]  simvastatin (ZOCOR) 40 MG tablet Take 40 mg by mouth at bedtime.    Yes [provider]  venlafaxine XR (EFFEXOR-XR) 150 MG 24 hr capsule Take 1 capsule (150 mg total) by mouth daily with breakfast. 03/29/17  Yes Clapacs, Jackquline Denmark, MD  hydrOXYzine (ATARAX/VISTARIL) 50 MG tablet Take 1 tablet (50 mg total) by mouth 3 (three) times daily as needed for anxiety. Patient not taking: Reported on 04/06/2017 09/26/14   Shari Prows, MD    Allergies Asa [aspirin] and Sulfa antibiotics  Family History  Problem Relation Age of Onset  . Hypertension Mother   . Stroke Mother     Social History Social History   Tobacco Use  . Smoking status: Former Smoker    Types: Cigarettes    Last attempt to quit: 07/29/1975    Years since quitting: 41.7  . Smokeless tobacco: Never Used  Substance Use Topics  . Alcohol use: No    Alcohol/week: 0.0 oz  . Drug use: No    Review of Systems Level 5 exemption history limited by the patient's clinical condition  ____________________________________________   PHYSICAL EXAM:  VITAL SIGNS: ED Triage Vitals [04/20/17 2243]  Enc Vitals Group     BP (!) 205/109     Pulse Rate (!) 122     Resp      Temp      Temp Source Oral     SpO2 95 %     Weight      Height      Head Circumference      Peak Flow      Pain Score      Pain Loc      Pain Edu?      Excl. in GC?     Constitutional: Moderate respiratory distress with rapid breathing and speaking in 2-3 word sentences mildly diaphoretic and appears quite uncomfortable and anxious Eyes: PERRL EOMI. Head: Atraumatic. Nose: No congestion/rhinnorhea. Mouth/Throat: No  trismus Neck: No stridor.  Able to lie completely flat with no jugular venous distention Cardiovascular: Tachycardic rate, regular rhythm. Grossly normal heart sounds.  Good peripheral circulation. Respiratory: Markedly increased respiratory effort with decreased breath sounds at the left base and crackles in bilateral lung fields Gastrointestinal: Soft nontender Musculoskeletal: Trace edema bilaterally legs equal in size Neurologic:  Normal speech and language. No gross focal neurologic deficits are appreciated. Skin:  Skin is warm, dry and intact. No rash noted. Psychiatric: Quite anxious appearing   ____________________________________________   DIFFERENTIAL includes but not limited to  Pneumonia, influenza, reactive airway disease, pneumothorax, pulmonary edema, pulmonary embolism ____________________________________________   LABS (all labs ordered are listed, but only abnormal results are displayed)  Labs Reviewed  COMPREHENSIVE METABOLIC PANEL - Abnormal; Notable for the following components:  Result Value   Sodium 134 (*)    Glucose, Bld 153 (*)    Calcium 8.8 (*)    ALT 10 (*)    All other components within normal limits  TROPONIN I - Abnormal; Notable for the following components:   Troponin I 0.04 (*)    All other components within normal limits  BRAIN NATRIURETIC PEPTIDE - Abnormal; Notable for the following components:   B Natriuretic Peptide 555.0 (*)    All other components within normal limits  CBC WITH DIFFERENTIAL/PLATELET - Abnormal; Notable for the following components:   RBC 3.46 (*)    Hemoglobin 11.3 (*)    HCT 33.8 (*)    RDW 16.6 (*)    Neutro Abs 8.5 (*)    Basophils Absolute 0.2 (*)    All other components within normal limits  INFLUENZA PANEL BY PCR (TYPE A & B)    Lab work reviewed by me with elevated beta natruretic peptide suggestive of pulmonary edema __________________________________________  EKG  ED ECG REPORT I, Merrily Brittle, the attending physician, personally viewed and interpreted this ECG.  Date: 04/20/2017 EKG Time: 2245 Rate: 121 Rhythm: Sinus tachycardia QRS Axis: Rightward axis Intervals: normal ST/T Wave abnormalities: normal Narrative Interpretation: no evidence of acute ischemia  ____________________________________________  RADIOLOGY  Chest x-ray reviewed by me consistent with pulmonary edema ____________________________________________   PROCEDURES  Procedure(s) performed: no  .Critical Care Performed by: Merrily Brittle, MD Authorized by: Merrily Brittle, MD   Critical care provider statement:    Critical care time (minutes):  30   Critical care time was exclusive of:  Separately billable procedures and treating other patients   Critical care was necessary to treat or prevent imminent or life-threatening deterioration of the following conditions:  Respiratory failure and cardiac failure   Critical care was time spent personally by me on the following activities:  Development of treatment plan with patient or surrogate, discussions with consultants, evaluation of patient's response to treatment, examination of patient, obtaining history from patient or surrogate, ordering and performing treatments and interventions, ordering and review of laboratory studies, ordering and review of radiographic studies, pulse oximetry, re-evaluation of patient's condition and review of old charts     Critical Care performed: yes  Observation: no ____________________________________________   INITIAL IMPRESSION / ASSESSMENT AND PLAN / ED COURSE  Pertinent labs & imaging results that were available during my care of the patient were reviewed by me and considered in my medical decision making (see chart for details).  On arrival the patient is tachycardic, tachypneic, mildly diaphoretic, and uncomfortable appearing.  Differential is broad but does include pulmonary embolism versus  pulmonary edema versus acute coronary syndrome etc.    I initially gave the patient fluids for her shortness of breath and tachycardia, however chest x-ray was concerning for acute pulmonary edema so I stopped it after only 100 cc.  The patient is able to lie completely flat but crackles at bilateral bases and her chest x-ray as well as beta natruretic peptide are concerning for new onset nonischemic cardiomyopathy.  At this point she requires inpatient admission for full cardiac risk stratification as well as diuresis.  A first dose of Lasix has been given now.  I discussed with the patient and her husband who verbalized understanding and agreement the plan.  I then discussed with the hospitalist Dr. Anne Hahn who is graciously agreed to admit the patient to his service.  ____________________________________________   FINAL CLINICAL IMPRESSION(S) / ED DIAGNOSES  Final diagnoses:  Acute pulmonary edema (HCC)      NEW MEDICATIONS STARTED DURING THIS VISIT:  New Prescriptions   No medications on file     Note:  This document was prepared using Dragon voice recognition software and may include unintentional dictation errors.     Merrily Brittleifenbark, Juandedios Dudash, MD 04/21/17 [phone]

## 2017-04-20 NOTE — ED Notes (Signed)
Patient transported to X-ray 

## 2017-04-20 NOTE — ED Notes (Signed)
Dr. Lamont Snowballrifenbark states to stop fluids at this time.

## 2017-04-20 NOTE — ED Triage Notes (Signed)
Patient arrived from home with co shortness of breath.  EMS states she has clear bilateral breath sounds and is 96% on RA.  EMS reports pt is hypertensive at 213/105.  She has hx of UTI's and started taking abx on Monday according to EMS.  They also stated she was hospitalized in December for a month d/t a UTI.

## 2017-04-21 ENCOUNTER — Encounter: Payer: Self-pay | Admitting: Internal Medicine

## 2017-04-21 DIAGNOSIS — R748 Abnormal levels of other serum enzymes: Secondary | ICD-10-CM | POA: Diagnosis not present

## 2017-04-21 DIAGNOSIS — I509 Heart failure, unspecified: Secondary | ICD-10-CM

## 2017-04-21 DIAGNOSIS — I10 Essential (primary) hypertension: Secondary | ICD-10-CM | POA: Diagnosis not present

## 2017-04-21 LAB — CBC
HCT: 31.4 % — ABNORMAL LOW (ref 35.0–47.0)
HEMOGLOBIN: 10.6 g/dL — AB (ref 12.0–16.0)
MCH: 32.6 pg (ref 26.0–34.0)
MCHC: 33.7 g/dL (ref 32.0–36.0)
MCV: 96.8 fL (ref 80.0–100.0)
Platelets: 231 10*3/uL (ref 150–440)
RBC: 3.24 MIL/uL — AB (ref 3.80–5.20)
RDW: 16.5 % — ABNORMAL HIGH (ref 11.5–14.5)
WBC: 7.8 10*3/uL (ref 3.6–11.0)

## 2017-04-21 LAB — BASIC METABOLIC PANEL
ANION GAP: 11 (ref 5–15)
BUN: 14 mg/dL (ref 6–20)
CALCIUM: 8.8 mg/dL — AB (ref 8.9–10.3)
CO2: 25 mmol/L (ref 22–32)
Chloride: 102 mmol/L (ref 101–111)
Creatinine, Ser: 0.81 mg/dL (ref 0.44–1.00)
Glucose, Bld: 106 mg/dL — ABNORMAL HIGH (ref 65–99)
Potassium: 3.6 mmol/L (ref 3.5–5.1)
Sodium: 138 mmol/L (ref 135–145)

## 2017-04-21 LAB — TROPONIN I
TROPONIN I: 0.06 ng/mL — AB (ref <0.03)
TROPONIN I: 0.06 ng/mL — AB (ref <0.03)
Troponin I: 0.05 ng/mL (ref <0.03)

## 2017-04-21 LAB — TSH: TSH: 3.384 u[IU]/mL (ref 0.350–4.500)

## 2017-04-21 LAB — INFLUENZA PANEL BY PCR (TYPE A & B)
INFLAPCR: NEGATIVE
INFLBPCR: NEGATIVE

## 2017-04-21 LAB — GLUCOSE, CAPILLARY: GLUCOSE-CAPILLARY: 112 mg/dL — AB (ref 65–99)

## 2017-04-21 LAB — BRAIN NATRIURETIC PEPTIDE: B NATRIURETIC PEPTIDE 5: 555 pg/mL — AB (ref 0.0–100.0)

## 2017-04-21 MED ORDER — ONDANSETRON HCL 4 MG/2ML IJ SOLN: 4.0000 mg | Freq: Once | INTRAMUSCULAR | Status: DC | PRN

## 2017-04-21 MED ORDER — FENTANYL CITRATE (PF) 100 MCG/2ML IJ SOLN: 25.0000 ug | INTRAMUSCULAR | Status: DC | PRN

## 2017-04-21 MED ORDER — VENLAFAXINE HCL ER 75 MG PO CP24
150.0000 mg | ORAL_CAPSULE | Freq: Every day | ORAL | Status: DC
Start: 2017-04-21 — End: 2017-04-23
  Administered 2017-04-21 – 2017-04-23 (×3): 150 mg via ORAL
  Filled 2017-04-21 (×3): qty 2

## 2017-04-21 MED ORDER — ORAL CARE MOUTH RINSE
15.0000 mL | Freq: Two times a day (BID) | OROMUCOSAL | Status: DC
  Administered 2017-04-21 – 2017-04-22 (×4): 15 mL via OROMUCOSAL

## 2017-04-21 MED ORDER — LOSARTAN POTASSIUM 25 MG PO TABS
25.0000 mg | ORAL_TABLET | Freq: Every day | ORAL | Status: DC
  Administered 2017-04-21 – 2017-04-23 (×3): 25 mg via ORAL
  Filled 2017-04-21 (×3): qty 1

## 2017-04-21 MED ORDER — SODIUM CHLORIDE 0.9 % IV SOLN: 500.0000 mL | Freq: Once | INTRAVENOUS | Status: DC

## 2017-04-21 MED ORDER — KETOROLAC TROMETHAMINE 30 MG/ML IJ SOLN: 30.0000 mg | Freq: Once | INTRAMUSCULAR | Status: DC

## 2017-04-21 MED ORDER — ACETAMINOPHEN 650 MG RE SUPP: 650.0000 mg | Freq: Four times a day (QID) | RECTAL | Status: DC | PRN

## 2017-04-21 MED ORDER — SODIUM CHLORIDE 0.9 % IV SOLN: 250.0000 mL | Freq: Once | INTRAVENOUS | Status: DC

## 2017-04-21 MED ORDER — MIRTAZAPINE 15 MG PO TABS
45.0000 mg | ORAL_TABLET | Freq: Every day | ORAL | Status: DC
  Administered 2017-04-21 – 2017-04-22 (×2): 45 mg via ORAL
  Filled 2017-04-21 (×2): qty 3

## 2017-04-21 MED ORDER — ONDANSETRON HCL 4 MG/2ML IJ SOLN: 4.0000 mg | Freq: Four times a day (QID) | INTRAMUSCULAR | Status: DC | PRN

## 2017-04-21 MED ORDER — FUROSEMIDE 10 MG/ML IJ SOLN
40.0000 mg | Freq: Every day | INTRAMUSCULAR | Status: DC
  Administered 2017-04-21: 40 mg via INTRAVENOUS
  Filled 2017-04-21: qty 4

## 2017-04-21 MED ORDER — ONDANSETRON HCL 4 MG PO TABS: 4.0000 mg | ORAL_TABLET | Freq: Four times a day (QID) | ORAL | Status: DC | PRN

## 2017-04-21 MED ORDER — PANTOPRAZOLE SODIUM 40 MG PO TBEC
40.0000 mg | DELAYED_RELEASE_TABLET | Freq: Two times a day (BID) | ORAL | Status: DC
  Administered 2017-04-21 – 2017-04-23 (×5): 40 mg via ORAL
  Filled 2017-04-21 (×5): qty 1

## 2017-04-21 MED ORDER — ACETAMINOPHEN 325 MG PO TABS: 650.0000 mg | ORAL_TABLET | Freq: Four times a day (QID) | ORAL | Status: DC | PRN

## 2017-04-21 MED ORDER — LABETALOL HCL 5 MG/ML IV SOLN
10.0000 mg | INTRAVENOUS | Status: DC | PRN
  Administered 2017-04-21 (×2): 10 mg via INTRAVENOUS
  Filled 2017-04-21 (×2): qty 4

## 2017-04-21 MED ORDER — ENOXAPARIN SODIUM 40 MG/0.4ML ~~LOC~~ SOLN
40.0000 mg | SUBCUTANEOUS | Status: DC
  Administered 2017-04-21 – 2017-04-22 (×2): 40 mg via SUBCUTANEOUS
  Filled 2017-04-21 (×2): qty 0.4

## 2017-04-21 MED ORDER — FUROSEMIDE 10 MG/ML IJ SOLN
20.0000 mg | Freq: Once | INTRAMUSCULAR | Status: AC
  Administered 2017-04-21: 20 mg via INTRAVENOUS
  Filled 2017-04-21: qty 4

## 2017-04-21 MED ORDER — POTASSIUM CHLORIDE CRYS ER 20 MEQ PO TBCR
20.0000 meq | EXTENDED_RELEASE_TABLET | Freq: Every day | ORAL | Status: DC
  Administered 2017-04-21 – 2017-04-23 (×3): 20 meq via ORAL
  Filled 2017-04-21 (×3): qty 1

## 2017-04-21 MED ORDER — CEPHALEXIN 500 MG PO CAPS
500.0000 mg | ORAL_CAPSULE | Freq: Two times a day (BID) | ORAL | Status: DC
  Administered 2017-04-21 – 2017-04-22 (×4): 500 mg via ORAL
  Filled 2017-04-21 (×5): qty 1

## 2017-04-21 MED ORDER — LABETALOL HCL 5 MG/ML IV SOLN: 10.0000 mg | INTRAVENOUS | Status: DC | PRN

## 2017-04-21 MED ORDER — HALOPERIDOL LACTATE 5 MG/ML IJ SOLN
2.5000 mg | Freq: Once | INTRAMUSCULAR | Status: AC
  Administered 2017-04-21: 2.5 mg via INTRAVENOUS
  Filled 2017-04-21: qty 1

## 2017-04-21 MED ORDER — FUROSEMIDE 40 MG PO TABS
40.0000 mg | ORAL_TABLET | Freq: Every day | ORAL | Status: DC
  Administered 2017-04-22: 40 mg via ORAL
  Filled 2017-04-21: qty 1

## 2017-04-21 MED ORDER — SIMVASTATIN 20 MG PO TABS
40.0000 mg | ORAL_TABLET | Freq: Every day | ORAL | Status: DC
  Administered 2017-04-21 – 2017-04-22 (×2): 40 mg via ORAL
  Filled 2017-04-21 (×2): qty 2

## 2017-04-21 NOTE — Progress Notes (Signed)
Ch responded to a  Order request for AD and prayer. Ch left AD for review and prayed   04/21/17 1800  Clinical Encounter Type  Visited With Patient  Visit Type Initial;Other (Comment)  Referral From Nurse  Spiritual Encounters  Spiritual Needs Prayer;Other (Comment) (advance directive Education)

## 2017-04-21 NOTE — ED Notes (Signed)
Pt assisted with bedpan 4 times since administration of lasix with return of ns.

## 2017-04-21 NOTE — Progress Notes (Signed)
Pt's troponin came back at 0.06. Dr. Nemiah CommanderKalisetti aware, no new orders received.

## 2017-04-21 NOTE — Plan of Care (Signed)
  Progressing Clinical Measurements: Ability to maintain clinical measurements within normal limits will improve 04/21/2017 2048 - Progressing by Crisoforo OxfordKyei, Slyvester Latona, RN Will remain free from infection 04/21/2017 2048 - Progressing by Crisoforo OxfordKyei, Lihanna Biever, RN Activity: Risk for activity intolerance will decrease 04/21/2017 2048 - Progressing by Crisoforo OxfordKyei, Allien Melberg, RN Nutrition: Adequate nutrition will be maintained 04/21/2017 2048 - Progressing by Crisoforo OxfordKyei, Madison Albea, RN Coping: Level of anxiety will decrease 04/21/2017 2048 - Progressing by Crisoforo OxfordKyei, Anadelia Kintz, RN Elimination: Will not experience complications related to bowel motility 04/21/2017 2048 - Progressing by Crisoforo OxfordKyei, Dee Paden, RN Will not experience complications related to urinary retention 04/21/2017 2048 - Progressing by Crisoforo OxfordKyei, Jag Lenz, RN Pain Managment: General experience of comfort will improve 04/21/2017 2048 - Progressing by Crisoforo OxfordKyei, Raequon Catanzaro, RN Safety: Ability to remain free from injury will improve 04/21/2017 2048 - Progressing by Crisoforo OxfordKyei, Arlie Riker, RN Skin Integrity: Risk for impaired skin integrity will decrease 04/21/2017 2048 - Progressing by Crisoforo OxfordKyei, Desarai Barrack, RN

## 2017-04-21 NOTE — Consult Note (Signed)
Cardiology Consultation:   Patient ID: Jenna Dudley; [id number]; 1936-03-07   Admit date: 04/20/2017 Date of Consult: 04/21/2017  Primary Care Provider: Micheline ChapmanAderoju, Marin OlpElizabeth Oyeyemi, MD Primary Cardiologist:  Ellis ParentsNew, Kaiyla Stahly  Primary Electrophysiologist:     Patient Profile:   Jenna Dudley is a 82 y.o. female with a hx of bipolar disease, depression, hyperlipidemia, hypertension who is being seen today for the evaluation of severe shortness of breath at the request of Dr Nemiah CommanderKalisetti. Marland Kitchen.  History of Present Illness:   Jenna Dudley is a 82 year old female with a history of anxiety and bipolar disease.  She presented to the emergency room in the early morning hours on January 26 with severe shortness of breath.  Been getting more more short of breath over the past week.  Shortness of breath seems to be worse at night.  The patient was thought to have new onset congestive heart failure and was given Lasix in the emergency room.  She had a good urine output and is breathing quite a bit better.  She was markedly hypertensive in the emergency room and her blood pressure has returned to normal.  An echocardiogram has been ordered has not been performed yet.  Past Medical History:  Diagnosis Date  . Bilateral carpal tunnel syndrome 07/27/14  . Bipolar disorder [facility](HCC)    [facility] 2018  . Chronic low back pain 07/29/14  . Degenerative arthritis of lumbar spine 07/27/14  . Depression   . Esophageal spasm 07/29/14  . GERD (gastroesophageal reflux disease)   . Hyperlipemia 07/29/14  . Hypertension   . Spinal stenosis 07/29/14    Past Surgical History:  Procedure Laterality Date  . HIP SURGERY Right   . INNER EAR SURGERY Right   . REPLACEMENT TOTAL KNEE BILATERAL Bilateral 07/27/14     Home Medications:  Prior to Admission medications   Medication Sig Start Date End Date Taking? Authorizing Provider  acetaminophen-codeine (TYLENOL #3) 300-30 MG per tablet Take 1 tablet by mouth  every 8 (eight) hours as needed for moderate pain or severe pain.   Yes [provider]  cephALEXin (KEFLEX) 500 MG capsule Take 1 capsule (500 mg total) by mouth 2 (two) times daily. 04/17/17  Yes Loleta RoseForbach, Cory, MD  losartan (COZAAR) 25 MG tablet Take 1 tablet (25 mg total) by mouth daily. 09/26/14  Yes Pucilowska, Jolanta B, MD  loxapine (LOXITANE) 25 MG capsule Take 1 capsule (25 mg total) by mouth at bedtime. 03/29/17  Yes Clapacs, Jackquline DenmarkJohn T, MD  mirtazapine (REMERON) 45 MG tablet Take 1 tablet (45 mg total) by mouth at bedtime. 03/29/17  Yes Clapacs, Jackquline DenmarkJohn T, MD  omeprazole (PRILOSEC) 20 MG capsule Take 20 mg by mouth 2 (two) times daily before a meal.    Yes [provider]  simvastatin (ZOCOR) 40 MG tablet Take 40 mg by mouth at bedtime.    Yes [provider]  venlafaxine XR (EFFEXOR-XR) 150 MG 24 hr capsule Take 1 capsule (150 mg total) by mouth daily with breakfast. 03/29/17  Yes Clapacs, Jackquline DenmarkJohn T, MD  hydrOXYzine (ATARAX/VISTARIL) 50 MG tablet Take 1 tablet (50 mg total) by mouth 3 (three) times daily as needed for anxiety. Patient not taking: Reported on 04/06/2017 09/26/14   Shari ProwsPucilowska, Jolanta B, MD    Inpatient Medications: Scheduled Meds: . cephALEXin  500 mg Oral BID  . enoxaparin (LOVENOX) injection  40 mg Subcutaneous Q24H  . furosemide  40 mg Intravenous Daily  . ketorolac  30  mg Intravenous Once  . ketorolac  30 mg Intravenous Once  . losartan  25 mg Oral Daily  . mouth rinse  15 mL Mouth Rinse BID  . mirtazapine  45 mg Oral QHS  . pantoprazole  40 mg Oral BID  . simvastatin  40 mg Oral QHS  . venlafaxine XR  150 mg Oral Q breakfast   Continuous Infusions: . sodium chloride    . sodium chloride Stopped (04/21/17 0254)   PRN Meds: acetaminophen **OR** acetaminophen, fentaNYL (SUBLIMAZE) injection, labetalol, ondansetron **OR** ondansetron (ZOFRAN) IV, ondansetron (ZOFRAN) IV  Allergies:    Allergies  Allergen Reactions  . Asa [Aspirin] Other (See  Comments)    Unknown reaction  . Sulfa Antibiotics Other (See Comments)    Unknown reaction    Social History:   Social History   Socioeconomic History  . Marital status: Married    Spouse name: Not on file  . Number of children: Not on file  . Years of education: Not on file  . Highest education level: Not on file  Social Needs  . Financial resource strain: Not hard at all  . Food insecurity - worry: Never true  . Food insecurity - inability: Never true  . Transportation needs - medical: No  . Transportation needs - non-medical: No  Occupational History    Comment: retired  Tobacco Use  . Smoking status: Former Smoker    Types: Cigarettes    Last attempt to quit: 07/29/1975    Years since quitting: 41.7  . Smokeless tobacco: Never Used  Substance and Sexual Activity  . Alcohol use: No    Alcohol/week: 0.0 oz  . Drug use: No  . Sexual activity: No    Comment: postmenopause  Other Topics Concern  . Not on file  Social History Narrative  . Not on file    Family History:    Family History  Problem Relation Age of Onset  . Hypertension Mother   . Stroke Mother      ROS:  Please see the history of present illness.   All other ROS reviewed and negative.     Physical Exam/Data:   Vitals:   04/21/17 0522 04/21/17 0905 04/21/17 0908 04/21/17 0912  BP: (!) 142/63 (!) 113/55    Pulse: 92 86    Resp:  16    Temp:  98.6 F (37 C)    TempSrc:  Oral    SpO2:  99% 92% 96%  Weight:        Intake/Output Summary (Last 24 hours) at 04/21/2017 1257 Last data filed at 04/21/2017 0756 Gross per 24 hour  Intake 50 ml  Output 1300 ml  Net -1250 ml   Filed Weights   04/21/17 0202  Weight: 140 lb 1.6 oz (63.5 kg)   Body mass index is 24.05 kg/m.  General: Elderly, frail female. HEENT: normal Lymph: no adenopathy Neck: no JVD Endocrine:  No thryomegaly Vascular: No carotid bruits; FA pulses 2+ bilaterally without bruits  Cardiac:  n regular rate S1-S2.  She has a  soft systolic murmur. Lungs:  clear to auscultation bilaterally, no wheezing, rhonchi or rales  Abd: soft, nontender, no hepatomegaly  Ext: no edema Musculoskeletal:  No deformities, BUE and BLE strength normal and equal Skin: warm and dry  Neuro:  CNs 2-12 intact, no focal abnormalities noted Psych: She has at least a moderate amount of dementia.   EKG:  The EKG was personally reviewed and demonstrates: Sinus tachycardia at 121.  She has nonspecific ST and T wave changes. Telemetry:  Telemetry was personally reviewed and demonstrates: Normal sinus rhythm  Relevant CV Studies:   Laboratory Data:  Chemistry Recent Labs  Lab 04/16/17 2156 04/20/17 2257 04/21/17 0922  NA 137 134* 138  K 3.7 3.8 3.6  CL 105 101 102  CO2 21* 24 25  GLUCOSE 135* 153* 106*  BUN 18 15 14   CREATININE 0.75 0.80 0.81  CALCIUM 8.8* 8.8* 8.8*  GFRNONAA >60 >60 >60  GFRAA >60 >60 >60  ANIONGAP 11 9 11     Recent Labs  Lab 04/17/17 0003 04/20/17 2257  PROT 7.5 7.7  ALBUMIN 4.2 4.3  AST 27 25  ALT 12* 10*  ALKPHOS 78 88  BILITOT 1.0 0.7   Hematology Recent Labs  Lab 04/16/17 2156 04/20/17 2257 04/21/17 0922  WBC 7.8 10.9 7.8  RBC 3.25* 3.46* 3.24*  HGB 10.6* 11.3* 10.6*  HCT 31.3* 33.8* 31.4*  MCV 96.5 97.5 96.8  MCH 32.6 32.6 32.6  MCHC 33.8 33.4 33.7  RDW 16.7* 16.6* 16.5*  PLT 251 263 231   Cardiac Enzymes Recent Labs  Lab 04/16/17 2156 04/17/17 0003 04/20/17 2257 04/21/17 0922  TROPONINI 0.04* 0.04* 0.04* 0.05*   No results for input(s): TROPIPOC in the last 168 hours.  BNP Recent Labs  Lab 04/20/17 2257  BNP 555.0*    DDimer No results for input(s): DDIMER in the last 168 hours.  Radiology/Studies:  Dg Chest 2 View  Result Date: 04/20/2017 CLINICAL DATA:  Shortness of breath EXAM: CHEST  2 VIEW COMPARISON:  04/16/2017 FINDINGS: Mild cardiomegaly. No large pleural effusion. Diffuse interstitial opacity, suspicious for pulmonary edema. Aortic atherosclerosis. No  pneumothorax. IMPRESSION: Mild cardiomegaly with diffuse interstitial opacity, suspect for pulmonary edema. Electronically Signed   By: Jasmine Pang M.D.   On: 04/20/2017 23:34    Assessment and Plan:   1. Acute congestive heart failure: She likely has acute diastolic congestive heart failure.  Echocardiogram has been ordered and is pending.  She is feeling much better on Lasix.  Her blood pressure is better controlled.  It is quite possible that she ate a salty meal.  She could not tell me how much salt she eats or if she actually tries to avoid salt.  Her husband does all of the cooking. Her I's and O's are -1.5 L so far.  We will change the Lasix to P.o. Lasix. We will add potassium chloride 20 mEq a day.  Her troponin levels are minimally elevated and are almost certainly related to demand ischemia with her acute diastolic congestive heart failure.  Given her dementia, this does not need any further workup.  2.  Minimally elevated troponin levels.  Troponin level this morning was 0.05.  This is likely related to her acute diastolic congestive heart failure.  She is 82 years old and has significant dementia.  She is a very poor candidate for invasive or interventional procedures.   Continue conservative therapy.  3.  Hypertension: She was markedly hypertensive on admission.  Her blood pressure is much better now.  Continue current medications.  Echocardiogram has been ordered.   For questions or updates, please contact [facility] Please consult [email or web address] for contact info under Cardiology/STEMI.   Signed, Kristeen Miss, MD  04/21/2017 12:57 PM

## 2017-04-21 NOTE — Progress Notes (Addendum)
Pt's troponin came back elevated at 0.05 this AM. Pt denies CP and states that her SOB has improved since her admission. Dr. Nemiah CommanderKalisetti notified via text-page system. No new orders received

## 2017-04-21 NOTE — Evaluation (Signed)
Physical Therapy Evaluation Patient Details Name: Jenna DaftJoan M Dormer MRN: [id number] DOB: 23-Apr-1935 Today's Date: 04/21/2017   History of Present Illness  presents to ER secondary to SOB, anxiety; admitted with new-onset CHF, pulmonary edema. Noted with mild elevation in troponin, demand ischemia per MD.  Clinical Impression  Upon evaluation, patient alert and oriented to basic information; follows simple commands, but requires mild increase in time for processing and task initiation.  Bilat UE/LE strength and ROM grossly symmetrical and WFL for basic transfers and mobility; no focal weakness appreciated.  Demonstrates ability to complete bed mobility with mod indep; sit/stand, basic transfers and gait (100' x2) with and without RW, cga/min assist.  Generally guarded with decreased cadence/gait speed without RW; improved comfort, confidence and fluidity with use of Rw.  Recommend continued use at this time until patient fully recovers mobility/endurance.   Maintains sats >92-93% on 2L throughout session.  If patient to discharge home with supplemental O2 (new), would benefit from education on cord management, safety needs in subsequent sessions. .Would benefit from skilled PT to address above deficits and promote optimal return to PLOF; Recommend transition to HHPT upon discharge from acute hospitalization.      Follow Up Recommendations Home health PT    Equipment Recommendations  Rolling walker with 5" wheels    Recommendations for Other Services       Precautions / Restrictions Precautions Precautions: Fall Restrictions Weight Bearing Restrictions: No      Mobility  Bed Mobility Overal bed mobility: Modified Independent                Transfers Overall transfer level: Needs assistance   Transfers: Sit to/from Stand Sit to Stand: Min guard;Supervision         General transfer comment: utilizes posterior surface of bilat LEs against edge of bed to stabilize durign lift  off  Ambulation/Gait Ambulation/Gait assistance: Min guard Ambulation Distance (Feet): 100 Feet Assistive device: None       General Gait Details: fair step height/length with fair cadence/overall gait speed; limited trunk rotation and guarded UE posturing. No overt buckling, LOB noted.  Stairs            Wheelchair Mobility    Modified Rankin (Stroke Patients Only)       Balance Overall balance assessment: Needs assistance Sitting-balance support: No upper extremity supported;Feet supported Sitting balance-Leahy Scale: Good     Standing balance support: No upper extremity supported Standing balance-Leahy Scale: Fair                               Pertinent Vitals/Pain Pain Assessment: No/denies pain    Home Living Family/patient expects to be discharged to:: Private residence Living Arrangements: Spouse/significant other Available Help at Discharge: Family Type of Home: House Home Access: Stairs to enter Entrance Stairs-Rails: Right Entrance Stairs-Number of Steps: 4 Home Layout: One level Home Equipment: Cane - single point      Prior Function Level of Independence: Independent         Comments: Indep wtih ADLs, household and community distances; intermittent use of SPC "when I need it".  Does endorse single fall history in previous 6-12 months; denies previous home O2.     Hand Dominance        Extremity/Trunk Assessment   Upper Extremity Assessment Upper Extremity Assessment: Overall WFL for tasks assessed    Lower Extremity Assessment Lower Extremity Assessment: Overall WFL for tasks assessed(grossly at  least 4/5 throughout)       Communication   Communication: HOH  Cognition Arousal/Alertness: Awake/alert Behavior During Therapy: WFL for tasks assessed/performed;Flat affect Overall Cognitive Status: Within Functional Limits for tasks assessed                                 General Comments: mild delay in  processing and initiation      General Comments      Exercises Other Exercises Other Exercises: 100' with RW, cga/clost sup-improved fluidity and overall confidence noted with use of RW; may consider continued use  in the short-term until mobility/endurance fully restored.  Sats 92-93% on 2L with exertion.   Assessment/Plan    PT Assessment Patient needs continued PT services  PT Problem List Decreased strength;Decreased balance;Decreased activity tolerance;Decreased mobility;Decreased cognition;Decreased knowledge of use of DME;Decreased safety awareness;Decreased knowledge of precautions;Cardiopulmonary status limiting activity       PT Treatment Interventions DME instruction;Gait training;Stair training;Functional mobility training;Therapeutic activities;Therapeutic exercise;Balance training;Patient/family education    PT Goals (Current goals can be found in the Care Plan section)  Acute Rehab PT Goals Patient Stated Goal: to get up to chair PT Goal Formulation: With patient Time For Goal Achievement: 05/05/17 Potential to Achieve Goals: Good    Frequency Min 2X/week   Barriers to discharge Decreased caregiver support      Co-evaluation               AM-PAC PT "6 Clicks" Daily Activity  Outcome Measure Difficulty turning over in bed (including adjusting bedclothes, sheets and blankets)?: A Little Difficulty moving from lying on back to sitting on the side of the bed? : A Little Difficulty sitting down on and standing up from a chair with arms (e.g., wheelchair, bedside commode, etc,.)?: Unable Help needed moving to and from a bed to chair (including a wheelchair)?: A Little Help needed walking in hospital room?: A Little Help needed climbing 3-5 steps with a railing? : A Little 6 Click Score: 16    End of Session Equipment Utilized During Treatment: Gait belt;Oxygen Activity Tolerance: Patient tolerated treatment well Patient left: in chair;with call bell/phone  within reach;with chair alarm set Nurse Communication: Mobility status PT Visit Diagnosis: Difficulty in walking, not elsewhere classified (R26.2)    Time: 4098-1191 PT Time Calculation (min) (ACUTE ONLY): 14 min   Charges:   PT Evaluation $PT Eval Low Complexity: 1 Low PT Treatments $Gait Training: 8-22 mins   PT G Codes:        Xian Alves H. Manson Passey, PT, DPT, NCS 04/21/17, 12:30 PM [phone]

## 2017-04-21 NOTE — ED Notes (Signed)
ED Provider at bedside. 

## 2017-04-21 NOTE — Progress Notes (Signed)
[facility] - [place] at [facility]   PATIENT NAME: Jenna BrackettJoan Dudley    MR#:  [id number]  DATE OF BIRTH:  December 28, 1935  SUBJECTIVE:  CHIEF COMPLAINT:   Chief Complaint  Patient presents with  . Shortness of Breath  . Anxiety   - Admitted with difficulty breathing and noted to have new onset CHF. On IV Lasix. -Acutely on 2 L oxygen at this time  REVIEW OF SYSTEMS:  Review of Systems  Constitutional: Negative for chills, fever and malaise/fatigue.  HENT: Negative for congestion, ear discharge, hearing loss and nosebleeds.   Respiratory: Positive for shortness of breath. Negative for cough and wheezing.   Cardiovascular: Negative for chest pain and palpitations.  Gastrointestinal: Negative for abdominal pain, constipation, diarrhea, nausea and vomiting.  Genitourinary: Negative for dysuria and urgency.  Musculoskeletal: Negative for myalgias.  Skin: Negative for rash.  Neurological: Negative for dizziness, speech change, focal weakness, seizures and headaches.  Psychiatric/Behavioral: Positive for depression.    DRUG ALLERGIES:   Allergies  Allergen Reactions  . Asa [Aspirin] Other (See Comments)    Unknown reaction  . Sulfa Antibiotics Other (See Comments)    Unknown reaction    VITALS:  Blood pressure (!) 113/55, pulse 86, temperature 98.6 F (37 C), temperature source Oral, resp. rate 16, weight 63.5 kg (140 lb 1.6 oz), SpO2 96 %.  PHYSICAL EXAMINATION:  Physical Exam  GENERAL:  82 y.o.-year-old patient lying in the bed with no acute distress.  EYES: Pupils equal, round, reactive to light and accommodation. No scleral icterus. Extraocular muscles intact.  HEENT: Head atraumatic, normocephalic. Oropharynx and nasopharynx clear.  NECK:  Supple, no jugular venous distention. No thyroid enlargement, no tenderness.  LUNGS: Normal breath sounds bilaterally, no wheezing, rhonchi or crepitation. Fine bibasilar crackles at the bases.  No use of accessory  muscles of respiration.  CARDIOVASCULAR: S1, S2 normal. No  rubs, or gallops. Soft systolic murmur heard ABDOMEN: Soft, nontender, nondistended. Bowel sounds present. No organomegaly or mass.  EXTREMITIES: No pedal edema, cyanosis, or clubbing.  NEUROLOGIC: Cranial nerves II through XII are intact. Muscle strength 5/5 in all extremities. Sensation intact. Gait not checked. Global weakness noted. PSYCHIATRIC: The patient is alert and oriented x 3. Flat affect SKIN: No obvious rash, lesion, or ulcer.    LABORATORY PANEL:   CBC Recent Labs  Lab 04/21/17 0922  WBC 7.8  HGB 10.6*  HCT 31.4*  PLT 231   ------------------------------------------------------------------------------------------------------------------  Chemistries  Recent Labs  Lab 04/20/17 2257 04/21/17 0922  NA 134* 138  K 3.8 3.6  CL 101 102  CO2 24 25  GLUCOSE 153* 106*  BUN 15 14  CREATININE 0.80 0.81  CALCIUM 8.8* 8.8*  AST 25  --   ALT 10*  --   ALKPHOS 88  --   BILITOT 0.7  --    ------------------------------------------------------------------------------------------------------------------  Cardiac Enzymes Recent Labs  Lab 04/21/17 0922  TROPONINI 0.05*   ------------------------------------------------------------------------------------------------------------------  RADIOLOGY:  Dg Chest 2 View  Result Date: 04/20/2017 CLINICAL DATA:  Shortness of breath EXAM: CHEST  2 VIEW COMPARISON:  04/16/2017 FINDINGS: Mild cardiomegaly. No large pleural effusion. Diffuse interstitial opacity, suspicious for pulmonary edema. Aortic atherosclerosis. No pneumothorax. IMPRESSION: Mild cardiomegaly with diffuse interstitial opacity, suspect for pulmonary edema. Electronically Signed   By: Jasmine PangKim  Fujinaga M.D.   On: 04/20/2017 23:34    EKG:   Orders placed or performed during the hospital encounter of 04/20/17  . EKG 12-Lead  . EKG 12-Lead  .  ED EKG  . ED EKG  . EKG 12-Lead  . EKG 12-Lead     ASSESSMENT AND PLAN:   82 year old female with past medical history significant for hypertension, GERD, severe bipolar disorder with depression and mania with psychosis in the past requiring ECT treatments comes to hospital secondary to worsening shortness of breath.  1. New onset congestive heart failure-admitted, started on IV Lasix -Acutely needing 2 L oxygen, continue to wean as tolerated -Echocardiogram today. Cardiology has been consulted.  2. Elevated troponin-secondary to demand ischemia. Denies any chest pain. Upon and also plateaued  3. Bipolar disorder with depression and anxiety-hours with Dr. Toni Amend as outpatient. Last visit was 04/06/2017. -Follow-up visit in 3 weeks recommended at that time. ECT restarted intermittently - outpatient follow up. -On Effexor and Remeron  4. GERD-on Protonix  5. DVT prophylaxis-Lovenox  6. UTI-on oral Keflex. Will continue for 5 days   Physical therapy to encourage ambulation   All the records are reviewed and case discussed with Care Management/Social Workerr. Management plans discussed with the patient, family and they are in agreement.  CODE STATUS: Full code  TOTAL TIME TAKING CARE OF THIS PATIENT: 37 minutes.   POSSIBLE D/C IN 1-2 DAYS, DEPENDING ON CLINICAL CONDITION.   Enid Baas M.D on 04/21/2017 at 10:38 AM  Between 7am to 6pm - Pager - [phone]  After 6pm go to [email or web address] - Social research officer, government  [facility]  Office  [phone]  CC: Primary care physician; Aderoju, Marin Olp, MD

## 2017-04-21 NOTE — ED Notes (Signed)
Family at bedside. 

## 2017-04-21 NOTE — Plan of Care (Signed)
  Progressing Clinical Measurements: Will remain free from infection 04/21/2017 0233 - Progressing by Crisoforo OxfordKyei, Dynisha Due, RN Respiratory complications will improve 04/21/2017 0233 - Progressing by Crisoforo OxfordKyei, Mayfield Schoene, RN Elimination: Will not experience complications related to bowel motility 04/21/2017 0233 - Progressing by Crisoforo OxfordKyei, Briante Loveall, RN Pain Managment: General experience of comfort will improve 04/21/2017 0233 - Progressing by Crisoforo OxfordKyei, Ricketta Colantonio, RN Safety: Ability to remain free from injury will improve 04/21/2017 0233 - Progressing by Crisoforo OxfordKyei, Emaly Boschert, RN Skin Integrity: Risk for impaired skin integrity will decrease 04/21/2017 0233 - Progressing by Crisoforo OxfordKyei, Dartagnan Beavers, RN

## 2017-04-21 NOTE — H&P (Signed)
[facility] - [place] at [facility]   PATIENT NAME: Jenna Dudley [place]    MR#:  [id number]  DATE OF BIRTH:  Feb 07, 1936  DATE OF ADMISSION:  04/20/2017  PRIMARY CARE PHYSICIAN: Aderoju, Marin OlpElizabeth Oyeyemi, MD   REQUESTING/REFERRING PHYSICIAN: Lamont Snowballifenbark, MD  CHIEF COMPLAINT:   Chief Complaint  Patient presents with  . Shortness of Breath  . Anxiety    HISTORY OF PRESENT ILLNESS:  Jenna Dudley Spivack  is a 82 y.o. female who presents with aggressive shortness of breath.  Symptoms have been getting worse over the last week or so.  Seemed to be worse at night.  Here in the ED she had some pulmonary edema on x-ray and elevated BNP.  Hospitals were called for further evaluation and treatment of likely new onset of heart failure.  PAST MEDICAL HISTORY:   Past Medical History:  Diagnosis Date  . Bilateral carpal tunnel syndrome 07/27/14  . Bipolar disorder [facility](HCC)    [facility] 2018  . Chronic low back pain 07/29/14  . Degenerative arthritis of lumbar spine 07/27/14  . Depression   . Esophageal spasm 07/29/14  . GERD (gastroesophageal reflux disease)   . Hyperlipemia 07/29/14  . Hypertension   . Spinal stenosis 07/29/14    PAST SURGICAL HISTORY:   Past Surgical History:  Procedure Laterality Date  . HIP SURGERY Right   . INNER EAR SURGERY Right   . REPLACEMENT TOTAL KNEE BILATERAL Bilateral 07/27/14    SOCIAL HISTORY:   Social History   Tobacco Use  . Smoking status: Former Smoker    Types: Cigarettes    Last attempt to quit: 07/29/1975    Years since quitting: 41.7  . Smokeless tobacco: Never Used  Substance Use Topics  . Alcohol use: No    Alcohol/week: 0.0 oz    FAMILY HISTORY:   Family History  Problem Relation Age of Onset  . Hypertension Mother   . Stroke Mother     DRUG ALLERGIES:   Allergies  Allergen Reactions  . Asa [Aspirin] Other (See Comments)    Unknown reaction  . Sulfa Antibiotics Other (See Comments)     Unknown reaction    MEDICATIONS AT HOME:   Prior to Admission medications   Medication Sig Start Date End Date Taking? Authorizing Provider  acetaminophen-codeine (TYLENOL #3) 300-30 MG per tablet Take 1 tablet by mouth every 8 (eight) hours as needed for moderate pain or severe pain.   Yes [provider]  cephALEXin (KEFLEX) 500 MG capsule Take 1 capsule (500 mg total) by mouth 2 (two) times daily. 04/17/17  Yes Loleta RoseForbach, Cory, MD  losartan (COZAAR) 25 MG tablet Take 1 tablet (25 mg total) by mouth daily. 09/26/14  Yes Pucilowska, Jolanta B, MD  loxapine (LOXITANE) 25 MG capsule Take 1 capsule (25 mg total) by mouth at bedtime. 03/29/17  Yes Clapacs, Jackquline DenmarkJohn T, MD  mirtazapine (REMERON) 45 MG tablet Take 1 tablet (45 mg total) by mouth at bedtime. 03/29/17  Yes Clapacs, Jackquline DenmarkJohn T, MD  omeprazole (PRILOSEC) 20 MG capsule Take 20 mg by mouth 2 (two) times daily before a meal.    Yes [provider]  simvastatin (ZOCOR) 40 MG tablet Take 40 mg by mouth at bedtime.    Yes [provider]  venlafaxine XR (EFFEXOR-XR) 150 MG 24 hr capsule Take 1 capsule (150 mg total) by mouth daily with breakfast. 03/29/17  Yes Clapacs, Jackquline DenmarkJohn T, MD  hydrOXYzine (ATARAX/VISTARIL) 50 MG tablet Take 1  tablet (50 mg total) by mouth 3 (three) times daily as needed for anxiety. Patient not taking: Reported on 04/06/2017 09/26/14   Shari Prows, MD    REVIEW OF SYSTEMS:  Review of Systems  Constitutional: Negative for chills, fever, malaise/fatigue and weight loss.  HENT: Negative for ear pain, hearing loss and tinnitus.   Eyes: Negative for blurred vision, double vision, pain and redness.  Respiratory: Positive for shortness of breath. Negative for cough and hemoptysis.   Cardiovascular: Negative for chest pain, palpitations, orthopnea and leg swelling.  Gastrointestinal: Negative for abdominal pain, constipation, diarrhea, nausea and vomiting.  Genitourinary: Negative for dysuria, frequency and  hematuria.  Musculoskeletal: Negative for back pain, joint pain and neck pain.  Skin:       No acne, rash, or lesions  Neurological: Negative for dizziness, tremors, focal weakness and weakness.  Endo/Heme/Allergies: Negative for polydipsia. Does not bruise/bleed easily.  Psychiatric/Behavioral: Negative for depression. The patient is not nervous/anxious and does not have insomnia.      VITAL SIGNS:   Vitals:   04/20/17 2243 04/20/17 2330 04/21/17 0000  BP: (!) 205/109 (!) 206/92 (!) 186/86  Pulse: (!) 122 (!) 114 (!) 118  Resp:  (!) 24 (!) 26  TempSrc: Oral    SpO2: 95% 96% 94%   Wt Readings from Last 3 Encounters:  04/16/17 64.4 kg (142 lb)  04/01/17 74.8 kg (165 lb)  02/10/17 69.9 kg (154 lb)    PHYSICAL EXAMINATION:  Physical Exam  Vitals reviewed. Constitutional: She is oriented to person, place, and time. She appears well-developed and well-nourished. No distress.  HENT:  Head: Normocephalic and atraumatic.  Mouth/Throat: Oropharynx is clear and moist.  Eyes: Conjunctivae and EOM are normal. Pupils are equal, round, and reactive to light. No scleral icterus.  Neck: Normal range of motion. Neck supple. No JVD present. No thyromegaly present.  Cardiovascular: Regular rhythm and intact distal pulses. Exam reveals no gallop and no friction rub.  No murmur heard. tachycardic  Respiratory: Effort normal. No respiratory distress. She has no wheezes. She has rales.  GI: Soft. Bowel sounds are normal. She exhibits no distension. There is no tenderness.  Musculoskeletal: Normal range of motion. She exhibits no edema.  No arthritis, no gout  Lymphadenopathy:    She has no cervical adenopathy.  Neurological: She is alert and oriented to person, place, and time. No cranial nerve deficit.  No dysarthria, no aphasia  Skin: Skin is warm and dry. No rash noted. No erythema.  Psychiatric: She has a normal mood and affect. Her behavior is normal. Judgment and thought content normal.     LABORATORY PANEL:   CBC Recent Labs  Lab 04/20/17 2257  WBC 10.9  HGB 11.3*  HCT 33.8*  PLT 263   ------------------------------------------------------------------------------------------------------------------  Chemistries  Recent Labs  Lab 04/20/17 2257  NA 134*  K 3.8  CL 101  CO2 24  GLUCOSE 153*  BUN 15  CREATININE 0.80  CALCIUM 8.8*  AST 25  ALT 10*  ALKPHOS 88  BILITOT 0.7   ------------------------------------------------------------------------------------------------------------------  Cardiac Enzymes Recent Labs  Lab 04/20/17 2257  TROPONINI 0.04*   ------------------------------------------------------------------------------------------------------------------  RADIOLOGY:  Dg Chest 2 View  Result Date: 04/20/2017 CLINICAL DATA:  Shortness of breath EXAM: CHEST  2 VIEW COMPARISON:  04/16/2017 FINDINGS: Mild cardiomegaly. No large pleural effusion. Diffuse interstitial opacity, suspicious for pulmonary edema. Aortic atherosclerosis. No pneumothorax. IMPRESSION: Mild cardiomegaly with diffuse interstitial opacity, suspect for pulmonary edema. Electronically Signed   By: Selena Batten  Jake Samples M.D.   On: 04/20/2017 23:34    EKG:   Orders placed or performed during the hospital encounter of 04/20/17  . EKG 12-Lead  . EKG 12-Lead  . ED EKG  . ED EKG  . EKG 12-Lead  . EKG 12-Lead    IMPRESSION AND PLAN:  Principal Problem:   New onset of congestive heart failure (HCC) -further evaluation with echocardiogram and cardiology consult.  Patient was given IV Lasix in the ED and is having good urine output. Active Problems:   Accelerated hypertension -patient's blood pressure is normally in the range of systolic 150.  We will continue her home meds as well as additional as needed antihypertensives for blood pressure goal less than 160/100.   HLD (hyperlipidemia) -home dose anti-lipids   Acid reflux -home dose PPI  All the records are reviewed and case  discussed with ED provider. Management plans discussed with the patient and/or family.  DVT PROPHYLAXIS: SubQ lovenox  GI PROPHYLAXIS: PPI  ADMISSION STATUS: Observation  CODE STATUS: Full Code Status History    Date Active Date Inactive Code Status Order ID Comments User Context   10/03/2015 13:40 10/15/2015 20:06 Full Code [id number]  Katharina Caper, MD Inpatient   09/17/2014 03:01 09/26/2014 19:29 Full Code [id number]  Clapacs, Jackquline Denmark, MD Inpatient      TOTAL TIME TAKING CARE OF THIS PATIENT: 40 minutes.   Breonia Kirstein FIELDING 04/21/2017, 1:14 AM  [employer]  [phone]  CC: Primary care physician; Aderoju, Marin Olp, MD  Note:  This document was prepared using Dragon voice recognition software and may include unintentional dictation errors.

## 2017-04-21 NOTE — Progress Notes (Signed)
The patient is admitted to room 244 with the diagnosis of new onset of CHF. Alert and oriented x 3 with forgetfulness. Patient is oriented to her room, call bel/ascom. Patient is super anxious and keep on saying, "I'm scared." Reassurance provided to the patient. Admission questions not completed due to patient's forgetfulness and will be done when the family gets to the bedside. This will be communicated to the day shift RN to get it completed. No acute distress noted or any c/o pain. Will continue to monitor.

## 2017-04-22 ENCOUNTER — Observation Stay (HOSPITAL_COMMUNITY)
Admit: 2017-04-22 | Discharge: 2017-04-22 | Disposition: A | Discharge status: Home / Self Care | Payer: Medicare Other | Attending: Internal Medicine | Admitting: Internal Medicine

## 2017-04-22 DIAGNOSIS — I509 Heart failure, unspecified: Secondary | ICD-10-CM | POA: Diagnosis not present

## 2017-04-22 LAB — BASIC METABOLIC PANEL
ANION GAP: 9 (ref 5–15)
BUN: 24 mg/dL — AB (ref 6–20)
CALCIUM: 8.9 mg/dL (ref 8.9–10.3)
CO2: 28 mmol/L (ref 22–32)
Chloride: 102 mmol/L (ref 101–111)
Creatinine, Ser: 0.76 mg/dL (ref 0.44–1.00)
GFR calc Af Amer: >60 mL/min (ref >60)
GLUCOSE: 104 mg/dL — AB (ref 65–99)
Potassium: 3.6 mmol/L (ref 3.5–5.1)
Sodium: 139 mmol/L (ref 135–145)

## 2017-04-22 NOTE — Progress Notes (Signed)
[employer] - [place] [facility]   PATIENT NAME: Jenna Dudley    MR#:  [id number]  DATE OF BIRTH:  [birth year]  SUBJECTIVE:  CHIEF COMPLAINT:   Chief Complaint  Patient presents with  . Shortness of Breath  . Anxiety   - Admitted with new onset CHF exacerbation. Much improved. Will be weaned off oxygen today. -Complains of anxiety  REVIEW OF SYSTEMS:  Review of Systems  Constitutional: Negative for chills, fever and malaise/fatigue.  HENT: Negative for congestion, ear discharge, hearing loss and nosebleeds.   Respiratory: Positive for shortness of breath. Negative for cough and wheezing.   Cardiovascular: Negative for chest pain and palpitations.  Gastrointestinal: Negative for abdominal pain, constipation, diarrhea, nausea and vomiting.  Genitourinary: Negative for dysuria and urgency.  Musculoskeletal: Negative for myalgias.  Skin: Negative for rash.  Neurological: Negative for dizziness, speech change, focal weakness, seizures and headaches.  Psychiatric/Behavioral: Positive for depression. The patient is nervous/anxious.     DRUG ALLERGIES:   Allergies  Allergen Reactions  . Asa [Aspirin] Other (See Comments)    Unknown reaction  . Sulfa Antibiotics Other (See Comments)    Unknown reaction    VITALS:  Blood pressure (!) 115/46, pulse 88, temperature 97.7 F (36.5 C), temperature source Oral, resp. rate 16, weight 61.1 kg (134 lb 11.2 oz), SpO2 100 %.  PHYSICAL EXAMINATION:  Physical Exam  GENERAL:  82 y.o.-year-old patient lying in the bed with no acute distress.  EYES: Pupils equal, round, reactive to light and accommodation. No scleral icterus. Extraocular muscles intact.  HEENT: Head atraumatic, normocephalic. Oropharynx and nasopharynx clear.  NECK:  Supple, no jugular venous distention. No thyroid enlargement, no tenderness.  LUNGS: Normal breath sounds bilaterally, no wheezing, rhonchi or crepitation. Fine bibasilar crackles at the  bases.  No use of accessory muscles of respiration.  CARDIOVASCULAR: S1, S2 normal. No  rubs, or gallops. Soft systolic murmur heard ABDOMEN: Soft, nontender, nondistended. Bowel sounds present. No organomegaly or mass.  EXTREMITIES: No pedal edema, cyanosis, or clubbing.  NEUROLOGIC: Cranial nerves II through XII are intact. Muscle strength 5/5 in all extremities. Sensation intact. Gait not checked. Global weakness noted. PSYCHIATRIC: The patient is alert and oriented x 3. Flat affect, it gets anxious at times SKIN: No obvious rash, lesion, or ulcer.    LABORATORY PANEL:   CBC Recent Labs  Lab 04/21/17 0922  WBC 7.8  HGB 10.6*  HCT 31.4*  PLT 231   ------------------------------------------------------------------------------------------------------------------  Chemistries  Recent Labs  Lab 04/20/17 2257  04/22/17 0603  NA 134*   < > 139  K 3.8   < > 3.6  CL 101   < > 102  CO2 24   < > 28  GLUCOSE 153*   < > 104*  BUN 15   < > 24*  CREATININE 0.80   < > 0.76  CALCIUM 8.8*   < > 8.9  AST 25  --   --   ALT 10*  --   --   ALKPHOS 88  --   --   BILITOT 0.7  --   --    < > = values in this interval not displayed.   ------------------------------------------------------------------------------------------------------------------  Cardiac Enzymes Recent Labs  Lab 04/21/17 2123  TROPONINI 0.06*   ------------------------------------------------------------------------------------------------------------------  RADIOLOGY:  Dg Chest 2 View  Result Date: 04/20/2017 CLINICAL DATA:  Shortness of breath EXAM: CHEST  2 VIEW COMPARISON:  04/16/2017 FINDINGS: Mild cardiomegaly. No large pleural effusion. Diffuse  interstitial opacity, suspicious for pulmonary edema. Aortic atherosclerosis. No pneumothorax. IMPRESSION: Mild cardiomegaly with diffuse interstitial opacity, suspect for pulmonary edema. Electronically Signed   By: Jasmine PangKim  Fujinaga M.D.   On: 04/20/2017 23:34    EKG:    Orders placed or performed during the hospital encounter of 04/20/17  . EKG 12-Lead  . EKG 12-Lead  . ED EKG  . ED EKG  . EKG 12-Lead  . EKG 12-Lead    ASSESSMENT AND PLAN:   82 year old female with past medical history significant for hypertension, GERD, severe bipolar disorder with depression and mania with psychosis in the past requiring ECT treatments comes to hospital secondary to worsening shortness of breath.  1. New onset congestive heart failure-admitted, started on IV Lasix-changed to oral today -Acutely needing 2 L oxygen, continue to wean as tolerated -Echocardiogram done. Cardiology consult is appreciated. -Also on cardiac medications with losartan.  2. Elevated troponin-secondary to demand ischemia. Denies any chest pain. - also plateaued  3. Bipolar disorder with depression and anxiety-follows with Dr. Toni Amendlapacs as outpatient. Last visit was 04/06/2017. -Follow-up visit in 3 weeks recommended at that time. ECT restarted intermittently - outpatient follow up. Consult psychiatry prior to discharge tomorrow -On Effexor and Remeron  4. GERD-on Protonix  5. DVT prophylaxis-Lovenox  6. UTI-on oral Keflex. Will continue for 5 days   Physical therapy to encourage ambulation-recommended home health Wean off oxygen and possible discharge tomorrow   All the records are reviewed and case discussed with Care Management/Social Workerr. Management plans discussed with the patient, family and they are in agreement.  CODE STATUS: Full code  TOTAL TIME TAKING CARE OF THIS PATIENT: 37 minutes.   POSSIBLE D/C TOMORROW, DEPENDING ON CLINICAL CONDITION.   Enid BaasKALISETTI,Favour Aleshire M.D on 04/22/2017 at 11:29 AM  Between 7am to 6pm - Pager - [phone]  After 6pm go to [email or web address] - Social research officer, governmentpassword EPAS [facility]  [facility]  Office  [phone]  CC: Primary care physician; Aderoju, Marin OlpElizabeth Oyeyemi, MD

## 2017-04-22 NOTE — Plan of Care (Signed)
Patient weaned to room air, ambulating to bathroom with one assist. Will continue to monitor.   Safety: Ability to remain free from injury will improve 04/22/2017 1335 - Progressing by Jake BatheKirkendall, Ezella Kell D, RN   Activity: Capacity to carry out activities will improve 04/22/2017 1335 - Progressing by Jake BatheKirkendall, Jaiyden Laur D, RN   Cardiac: Ability to achieve and maintain adequate cardiopulmonary perfusion will improve 04/22/2017 1335 - Progressing by Heath LarkKirkendall, Christian Borgerding D, RN

## 2017-04-22 NOTE — Progress Notes (Addendum)
Progress Note  Patient Name: Galen DaftJoan M Riesen Date of Encounter: 04/22/2017  Primary Cardiologist:    New, Greer Wainright   Subjective   The patient is a 82 year old female with a history of dementia, bipolar disease, hyperlipidemia who was admitted with shortness of breath.  Inpatient Medications    Scheduled Meds: . cephALEXin  500 mg Oral BID  . enoxaparin (LOVENOX) injection  40 mg Subcutaneous Q24H  . furosemide  40 mg Oral Daily  . ketorolac  30 mg Intravenous Once  . ketorolac  30 mg Intravenous Once  . losartan  25 mg Oral Daily  . mouth rinse  15 mL Mouth Rinse BID  . mirtazapine  45 mg Oral QHS  . pantoprazole  40 mg Oral BID  . potassium chloride  20 mEq Oral Daily  . simvastatin  40 mg Oral QHS  . venlafaxine XR  150 mg Oral Q breakfast   Continuous Infusions: . sodium chloride    . sodium chloride Stopped (04/21/17 0254)   PRN Meds: acetaminophen **OR** acetaminophen, fentaNYL (SUBLIMAZE) injection, labetalol, ondansetron **OR** ondansetron (ZOFRAN) IV, ondansetron (ZOFRAN) IV   Vital Signs    Vitals:   04/22/17 0327 04/22/17 0434 04/22/17 0758 04/22/17 1025  BP: (!) 114/57  (!) 115/46   Pulse: 81  88   Resp: (!) 24  16   Temp: 98.4 F (36.9 C)  97.7 F (36.5 C)   TempSrc:   Oral   SpO2: 100%  100% 100%  Weight:  134 lb 11.2 oz (61.1 kg)      Intake/Output Summary (Last 24 hours) at 04/22/2017 1118 Last data filed at 04/22/2017 0953 Gross per 24 hour  Intake 480 ml  Output 652 ml  Net -172 ml   Filed Weights   04/21/17 0202 04/22/17 0434  Weight: 140 lb 1.6 oz (63.5 kg) 134 lb 11.2 oz (61.1 kg)    Telemetry     NSR - Personally Reviewed  ECG       Physical Exam   GEN:  Elderly female, no acute distress Neck: No JVD Cardiac: RRR, no murmurs, rubs, or gallops.  Respiratory: Clear to auscultation bilaterally. GI: Soft, nontender, non-distended  MS: No edema; No deformity. Neuro:  Nonfocal  Psych: Appears to be somewhat demented.  Labs    Chemistry Recent Labs  Lab 04/17/17 0003 04/20/17 2257 04/21/17 0922 04/22/17 0603  NA  --  134* 138 139  K  --  3.8 3.6 3.6  CL  --  101 102 102  CO2  --  24 25 28   GLUCOSE  --  153* 106* 104*  BUN  --  15 14 24*  CREATININE  --  0.80 0.81 0.76  CALCIUM  --  8.8* 8.8* 8.9  PROT 7.5 7.7  --   --   ALBUMIN 4.2 4.3  --   --   AST 27 25  --   --   ALT 12* 10*  --   --   ALKPHOS 78 88  --   --   BILITOT 1.0 0.7  --   --   GFRNONAA  --  >60 >60 >60  GFRAA  --  >60 >60 >60  ANIONGAP  --  9 11 9      Hematology Recent Labs  Lab 04/16/17 2156 04/20/17 2257 04/21/17 0922  WBC 7.8 10.9 7.8  RBC 3.25* 3.46* 3.24*  HGB 10.6* 11.3* 10.6*  HCT 31.3* 33.8* 31.4*  MCV 96.5 97.5 96.8  MCH 32.6 32.6  32.6  MCHC 33.8 33.4 33.7  RDW 16.7* 16.6* 16.5*  PLT 251 263 231    Cardiac Enzymes Recent Labs  Lab 04/20/17 2257 04/21/17 0922 04/21/17 1515 04/21/17 2123  TROPONINI 0.04* 0.05* 0.06* 0.06*   No results for input(s): TROPIPOC in the last 168 hours.   BNP Recent Labs  Lab 04/20/17 2257  BNP 555.0*     DDimer No results for input(s): DDIMER in the last 168 hours.   Radiology    Dg Chest 2 View  Result Date: 04/20/2017 CLINICAL DATA:  Shortness of breath EXAM: CHEST  2 VIEW COMPARISON:  04/16/2017 FINDINGS: Mild cardiomegaly. No large pleural effusion. Diffuse interstitial opacity, suspicious for pulmonary edema. Aortic atherosclerosis. No pneumothorax. IMPRESSION: Mild cardiomegaly with diffuse interstitial opacity, suspect for pulmonary edema. Electronically Signed   By: Jasmine Pang M.D.   On: 04/20/2017 23:34    Cardiac Studies     Patient Profile     82 y.o. female who was admitted with shortness of breath and some chest discomfort.  She has a very minimal troponin levels.  Assessment & Plan    1.  Acute on chronic presumed diastolic congestive heart failure. Patient is doing quite a bit better after diuresis.  She is now on p.o. Lasix and potassium  chloride.  Continue very conservative therapy.  2.  Troponin elevations: The troponin elevation is very minimal and is also flat.  This is consistent with demand ischemia and is not necessarily suggestive of an acute coronary syndrome.  As mentioned yesterday, she is a relatively poor candidate for invasive and interventional procedures.  She is not having any chest pain so there are no plans for further ischemic workup.  I suspect that she will be able to go home very soon.   For questions or updates, please contact [facility] Please consult [email or web address] for contact info under Cardiology/STEMI.      Signed, Kristeen Miss, MD  04/22/2017, 11:18 AM

## 2017-04-22 NOTE — Plan of Care (Signed)
  Progressing Clinical Measurements: Ability to maintain clinical measurements within normal limits will improve 04/22/2017 2224 - Progressing by Crisoforo OxfordKyei, Charnee Turnipseed, RN Will remain free from infection 04/22/2017 2224 - Progressing by Crisoforo OxfordKyei, Rubbie Goostree, RN Elimination: Will not experience complications related to bowel motility 04/22/2017 2224 - Progressing by Crisoforo OxfordKyei, Kari Montero, RN Will not experience complications related to urinary retention 04/22/2017 2224 - Progressing by Crisoforo OxfordKyei, Candiss Galeana, RN Pain Managment: General experience of comfort will improve 04/22/2017 2224 - Progressing by Crisoforo OxfordKyei, Ledford Goodson, RN

## 2017-04-22 NOTE — Progress Notes (Signed)
Initial Nutrition Assessment  DOCUMENTATION CODES:   Not applicable  INTERVENTION:  Encouraged ongoing intake of adequate calories and protein at meals to prevent any further unintentional weight loss or loss of lean body mass.  Provided education on diet for CHF to husband (note to follow).  NUTRITION DIAGNOSIS:   Food and nutrition related knowledge deficit related to limited prior education as evidenced by per patient/family report.  GOAL:   Patient will meet greater than or equal to 90% of their needs  MONITOR:   PO intake, Labs, Weight trends, I & O's  REASON FOR ASSESSMENT:   Malnutrition Screening Tool    ASSESSMENT:   82 year old female with PMHx of bipolar disorder and depression (follows Dr. Weber Cooks outpatient and receives intermittent ECT), HTN, HLD, chronic lower back pain, GERD, hx esophageal spasm who presented with shortness of breath found to have new onset CHF.   Met with patient and her husband at bedside. Patient is a poor historian, so history obtained from husband. He reports about 6 months ago she had a UTI which worsened her bipolar symptoms. She was not able to eat well after that and lost a lot of weight. About one month ago she went to a geriatric treatment facility in [place] and was able to get restarted on her ECT, which has been helping significantly. He reports her appetite is back to normal now and she is eating about 75% of three meals daily. He had been giving her Ensure when she was not eating well but he reports she does not need it anymore since she is eating so well.  UBW was 170-175 lbs. Per chart patient was 169.7 lbs on 07/10/2016, then lost down to 134.6 lbs on 03/03/2017. She lost 35.1 lbs (20.7% body weight) over 8 months, which is significant for time frame. Husband reports she has already gained weight back over the past month.  Medications reviewed and include: Keflex, Lasix 40 mg daily, Remeron 45 mg QHS, pantoprazole, potassium  chloride 20 mEq daily.  Labs reviewed: BUN 24.  Patient does not meet criteria for malnutrition at this time.  NUTRITION - FOCUSED PHYSICAL EXAM:    Most Recent Value  Orbital Region  No depletion  Upper Arm Region  Mild depletion  Thoracic and Lumbar Region  No depletion  Buccal Region  No depletion  Temple Region  Mild depletion  Clavicle Bone Region  No depletion  Clavicle and Acromion Bone Region  No depletion  Scapular Bone Region  No depletion  Dorsal Hand  No depletion  Patellar Region  No depletion  Anterior Thigh Region  No depletion  Posterior Calf Region  No depletion  Edema (RD Assessment)  None  Hair  Reviewed  Eyes  Reviewed  Mouth  Reviewed  Skin  Reviewed  Nails  Reviewed     Diet Order:  Diet Heart Room service appropriate? Yes; Fluid consistency: Thin  EDUCATION NEEDS:   Education needs have been addressed  Skin:  Skin Assessment: Reviewed RN Assessment  Last BM:  04/22/2017 - large type 3  Height:   Ht Readings from Last 1 Encounters:  04/16/17 '5\' 4"'  (1.626 m)    Weight:   Wt Readings from Last 1 Encounters:  04/22/17 134 lb 11.2 oz (61.1 kg)    Ideal Body Weight:  54.5 kg(used ht of '5\' 4"'  from previous admission)  BMI:  Body mass index is 23.12 kg/m.  Estimated Nutritional Needs:   Kcal:  1530-1830 (25-30 kcal/kg)  Protein:  75-85 grams (1.2-1.4 grams/kg)  Fluid:  1.5 L/day (25 mL/kg)  Willey Blade, MS, RD, LDN Office: [phone] Pager: [phone] After Hours/Weekend Pager: [phone]

## 2017-04-22 NOTE — Plan of Care (Signed)
Nutrition Education Note  RD identified need for nutrition education regarding new onset CHF during initial assessment.  RD provided "Heart Failure Nutrition Therapy" handout from the Academy of [employer]. Reviewed patient's dietary recall. Provided examples on ways to decrease sodium intake in diet. Discouraged intake of processed foods and use of salt shaker. Encouraged fresh fruits and vegetables as well as whole grain sources of carbohydrates to maximize fiber intake.   RD discussed why it is important for patient to adhere to diet recommendations, and emphasized the role of fluids, foods to avoid, and importance of weighing self daily. Teach back method used.  Expect good compliance.  Body mass index is 23.12 kg/m. Pt meets criteria for normal weight based on current BMI.  Current diet order is heart healthy, patient is consuming approximately 50% of meals at this time. Labs and medications reviewed.   Helane RimaLeanne Kaleeah Gingerich, MS, RD, LDN Office: [phone] Pager: [phone] After Hours/Weekend Pager: [phone]

## 2017-04-23 ENCOUNTER — Telehealth: Payer: Self-pay | Admitting: *Deleted

## 2017-04-23 DIAGNOSIS — E785 Hyperlipidemia, unspecified: Secondary | ICD-10-CM | POA: Diagnosis present

## 2017-04-23 DIAGNOSIS — I110 Hypertensive heart disease with heart failure: Secondary | ICD-10-CM | POA: Diagnosis present

## 2017-04-23 DIAGNOSIS — I248 Other forms of acute ischemic heart disease: Secondary | ICD-10-CM | POA: Diagnosis present

## 2017-04-23 DIAGNOSIS — Z8249 Family history of ischemic heart disease and other diseases of the circulatory system: Secondary | ICD-10-CM | POA: Diagnosis not present

## 2017-04-23 DIAGNOSIS — K219 Gastro-esophageal reflux disease without esophagitis: Secondary | ICD-10-CM | POA: Diagnosis present

## 2017-04-23 DIAGNOSIS — I509 Heart failure, unspecified: Secondary | ICD-10-CM | POA: Diagnosis not present

## 2017-04-23 DIAGNOSIS — Z87891 Personal history of nicotine dependence: Secondary | ICD-10-CM | POA: Diagnosis not present

## 2017-04-23 DIAGNOSIS — F419 Anxiety disorder, unspecified: Secondary | ICD-10-CM | POA: Diagnosis present

## 2017-04-23 DIAGNOSIS — I5033 Acute on chronic diastolic (congestive) heart failure: Secondary | ICD-10-CM | POA: Diagnosis present

## 2017-04-23 DIAGNOSIS — N390 Urinary tract infection, site not specified: Secondary | ICD-10-CM | POA: Diagnosis present

## 2017-04-23 DIAGNOSIS — R0602 Shortness of breath: Secondary | ICD-10-CM | POA: Diagnosis present

## 2017-04-23 DIAGNOSIS — I472 Ventricular tachycardia: Secondary | ICD-10-CM | POA: Diagnosis not present

## 2017-04-23 DIAGNOSIS — Z96653 Presence of artificial knee joint, bilateral: Secondary | ICD-10-CM | POA: Diagnosis present

## 2017-04-23 DIAGNOSIS — F319 Bipolar disorder, unspecified: Secondary | ICD-10-CM | POA: Diagnosis present

## 2017-04-23 DIAGNOSIS — F0390 Unspecified dementia without behavioral disturbance: Secondary | ICD-10-CM | POA: Diagnosis present

## 2017-04-23 LAB — BASIC METABOLIC PANEL
ANION GAP: 13 (ref 5–15)
BUN: 27 mg/dL — ABNORMAL HIGH (ref 6–20)
CALCIUM: 9.1 mg/dL (ref 8.9–10.3)
CO2: 24 mmol/L (ref 22–32)
Chloride: 97 mmol/L — ABNORMAL LOW (ref 101–111)
Creatinine, Ser: 0.92 mg/dL (ref 0.44–1.00)
GFR calc non Af Amer: 58 mL/min — ABNORMAL LOW (ref >60)
Glucose, Bld: 103 mg/dL — ABNORMAL HIGH (ref 65–99)
Potassium: 3.7 mmol/L (ref 3.5–5.1)
SODIUM: 134 mmol/L — AB (ref 135–145)

## 2017-04-23 LAB — ECHOCARDIOGRAM COMPLETE: WEIGHTICAEL: 2155.2 [oz_av]

## 2017-04-23 LAB — GLUCOSE, CAPILLARY: GLUCOSE-CAPILLARY: 153 mg/dL — AB (ref 65–99)

## 2017-04-23 MED ORDER — METOPROLOL TARTRATE 25 MG PO TABS: 25.0000 mg | ORAL_TABLET | Freq: Two times a day (BID) | ORAL | 2 refills | Status: DC

## 2017-04-23 MED ORDER — METOPROLOL TARTRATE 25 MG PO TABS
25.0000 mg | ORAL_TABLET | Freq: Two times a day (BID) | ORAL | Status: DC
  Administered 2017-04-23: 25 mg via ORAL
  Filled 2017-04-23: qty 1

## 2017-04-23 MED ORDER — FUROSEMIDE 20 MG PO TABS: 20.0000 mg | ORAL_TABLET | Freq: Every day | ORAL | 3 refills | Status: DC

## 2017-04-23 MED ORDER — FUROSEMIDE 20 MG PO TABS
20.0000 mg | ORAL_TABLET | Freq: Every day | ORAL | Status: DC
  Administered 2017-04-23: 20 mg via ORAL
  Filled 2017-04-23: qty 1

## 2017-04-23 NOTE — Progress Notes (Signed)
Patient had 6 beats of VT. Patient was asymptomatic on re-assessment. Dr. Anne HahnWillis notified without any new order. Will continue to monitor.

## 2017-04-23 NOTE — Progress Notes (Signed)
Patient admitted on 04/21/2017 with dx of acute pulmonary edema and new onset of CHF. Elevated troponin due to demand ischemia.   Patient's active problems during this admission included HLD, Acid Reflux, Accelerated HTN, and CHF.  Patient is followed by Dr. Toni Amendlapacs for bipolar disorder / depression.  Patient has been receiving ECT treatments.    Echo performed this admission which revealed diastolic dysfunction and LVH with EF of 55%.  Patient on Losartan, Metoprolol, and lasix.  No longer requiring oxygen.    CHF Education:?? Educational session with patient completed. Husband was not at bedside.  ? ? Provided patient with "Living Better with Heart Failure" packet. Briefly reviewed definition of heart failure and signs and symptoms of an exacerbation.  Explained to patient that HF is a chronic illness which requires self-assessment / self-management along with help from the cardiologist/PCP/HF Clinic.???  *Reviewed importance of and reason behind checking weight daily in the AM, after using the bathroom, but before getting dressed.?Patient has scales.? ? Reviewed the following information with patient:  *Discussed when to call the Dr= weight gain of >2-3lb overnight of 5lb in a week,  *Discussed yellow zone= call MD: weight gain of >2-3lb overnight of 5lb in a week, increased swelling, increased SOB when lying down, chest discomfort, dizziness, increased fatigue *Red Zone= call 911: struggle to breath, fainting or near fainting, significant chest pain  ? *Reviewed low sodium diet-provided handout of recommended and not recommended foods. ?Reviewed reading labels with patient. Discussed fluid intake with patient as well. Patient not currently on a fluid restriction, but advised no more than 8-8 ounces glass of fluids per day.?Note: ?Dietitian has seen and educated patient on low sodium heart healty modified diet. ?  *Instructed patient to take medications as prescribed for heart failure. Explained  briefly why pt is on the medications (either make you feel better, live longer or keep you out of the hospital) and discussed monitoring and side effects.   *Smoking Cessation?- Patient is a never smoker  * Exercise - Patient encouraged to be as active as she possibly can be.    *[facility] - The role of [facility] explained to patient.  Explained to patient the HF Clinic does not replace her cardiologist nor her PCP,  but is an additional resource for her in managing her HF.    Patient thanked me for providing her with the "Living Better with Heart Failure" packet and for reviewing it with her.  Patient stated she would review this with her husband.    Army Meliaiane Nashae Maudlin, RN, BSN, [facility] Cardiovascular and Pulmonary Nurse Navigator ?

## 2017-04-23 NOTE — Progress Notes (Signed)
Progress Note  Patient Name: Jenna Dudley Date of Encounter: 04/23/2017  Primary Cardiologist: new seen by Nahser  Subjective   She feels better overall with shortness of breath close to baseline.  No chest pain.  She had 6 beat run of nonsustained ventricular tachycardia overnight.  Current rhythm is sinus rhythm with frequent PACs.  Inpatient Medications    Scheduled Meds: . cephALEXin  500 mg Oral BID  . enoxaparin (LOVENOX) injection  40 mg Subcutaneous Q24H  . furosemide  20 mg Oral Daily  . ketorolac  30 mg Intravenous Once  . ketorolac  30 mg Intravenous Once  . losartan  25 mg Oral Daily  . mouth rinse  15 mL Mouth Rinse BID  . metoprolol tartrate  25 mg Oral BID  . mirtazapine  45 mg Oral QHS  . pantoprazole  40 mg Oral BID  . potassium chloride  20 mEq Oral Daily  . simvastatin  40 mg Oral QHS  . venlafaxine XR  150 mg Oral Q breakfast   Continuous Infusions: . sodium chloride    . sodium chloride Stopped (04/21/17 0254)   PRN Meds: acetaminophen **OR** acetaminophen, fentaNYL (SUBLIMAZE) injection, labetalol, ondansetron **OR** ondansetron (ZOFRAN) IV, ondansetron (ZOFRAN) IV   Vital Signs    Vitals:   04/22/17 1025 04/22/17 2002 04/23/17 0434 04/23/17 0732  BP:  (!) 141/63 (!) 164/77 (!) 154/60  Pulse:  94 68 (!) 101  Resp:  18 18 18   Temp:  98.5 F (36.9 C) 97.9 F (36.6 C) 97.6 F (36.4 C)  TempSrc:  Oral  Oral  SpO2: 100% 97% 99% 97%  Weight:   137 lb 9.6 oz (62.4 kg)     Intake/Output Summary (Last 24 hours) at 04/23/2017 0936 Last data filed at 04/23/2017 0100 Gross per 24 hour  Intake 240 ml  Output 1 ml  Net 239 ml   Filed Weights   04/21/17 0202 04/22/17 0434 04/23/17 0434  Weight: 140 lb 1.6 oz (63.5 kg) 134 lb 11.2 oz (61.1 kg) 137 lb 9.6 oz (62.4 kg)    Telemetry    6 beats run  of nonsustained ventricular tachycardia.  Sinus rhythm with frequent PACs.- Personally Reviewed  ECG    Not done today.- Personally  Reviewed  Physical Exam   GEN: No acute distress.   Neck: No JVD Cardiac: RRR, no murmurs, rubs, or gallops.  Respiratory: Clear to auscultation bilaterally. GI: Soft, nontender, non-distended  MS: No edema; No deformity. Neuro:  Nonfocal  Psych: Normal affect   Labs    Chemistry Recent Labs  Lab 04/17/17 0003 04/20/17 2257 04/21/17 0922 04/22/17 0603 04/23/17 0333  NA  --  134* 138 139 134*  K  --  3.8 3.6 3.6 3.7  CL  --  101 102 102 97*  CO2  --  24 25 28 24   GLUCOSE  --  153* 106* 104* 103*  BUN  --  15 14 24* 27*  CREATININE  --  0.80 0.81 0.76 0.92  CALCIUM  --  8.8* 8.8* 8.9 9.1  PROT 7.5 7.7  --   --   --   ALBUMIN 4.2 4.3  --   --   --   AST 27 25  --   --   --   ALT 12* 10*  --   --   --   ALKPHOS 78 88  --   --   --   BILITOT 1.0 0.7  --   --   --  GFRNONAA  --  >60 >60 >60 58*  GFRAA  --  >60 >60 >60 >60  ANIONGAP  --  9 11 9 13      Hematology Recent Labs  Lab 04/16/17 2156 04/20/17 2257 04/21/17 0922  WBC 7.8 10.9 7.8  RBC 3.25* 3.46* 3.24*  HGB 10.6* 11.3* 10.6*  HCT 31.3* 33.8* 31.4*  MCV 96.5 97.5 96.8  MCH 32.6 32.6 32.6  MCHC 33.8 33.4 33.7  RDW 16.7* 16.6* 16.5*  PLT 251 263 231    Cardiac Enzymes Recent Labs  Lab 04/20/17 2257 04/21/17 0922 04/21/17 1515 04/21/17 2123  TROPONINI 0.04* 0.05* 0.06* 0.06*   No results for input(s): TROPIPOC in the last 168 hours.   BNP Recent Labs  Lab 04/20/17 2257  BNP 555.0*     DDimer No results for input(s): DDIMER in the last 168 hours.   Radiology    No results found.  Cardiac Studies   Echocardiogram was personally reviewed by me and showed normal LV systolic function, grade 1 diastolic dysfunction and no significant pulmonary hypertension.  Patient Profile     82 y.o. female with history of mild dementia, bipolar disorder and hyperlipidemia who presented with shortness of breath and was found to have mild heart failure that improved with diuresis.  There was minimal  elevation in troponin.  Assessment & Plan    1.  Acute diastolic heart failure: Volume status appears to be good.  BUN increased gradually and thus I recommend decreasing the dose of furosemide to 20 mg daily.  Recommend blood pressure control.   2.  Mildly elevated troponin: Likely due to demand ischemia: However, the patient has risk factors for coronary artery disease and also had nonsustained ventricular tachycardia overnight.  I agree with starting small dose of metoprolol.  She is already on simvastatin.  She was deemed to be not a good candidate for invasive or interventional procedures but I think it is reasonable to risk stratify with an outpatient nuclear stress test.  The patient will be discharged home today.  I discussed the case with Dr. Nemiah Commander.  I will arrange for follow-up in our office in 1-2 weeks.  For questions or updates, please contact [facility] Please consult [email or web address] for contact info under Cardiology/STEMI.      Signed, Lorine Bears, MD  04/23/2017, 9:36 AM

## 2017-04-23 NOTE — Discharge Instructions (Signed)
Heart Failure Clinic appointment on May 02 2017 at 11:40am with Clarisa Kindredina Keene Gilkey, FNP. Please call [phone] to reschedule.

## 2017-04-23 NOTE — Discharge Summary (Signed)
[facility] [place] [facility]   PATIENT NAME: Jenna Dudley    MR#:  [id number]  DATE OF BIRTH:  [birth year]  DATE OF ADMISSION:  04/20/2017   ADMITTING PHYSICIAN: Oralia Manis, MD  DATE OF DISCHARGE: 04/23/17  PRIMARY CARE PHYSICIAN: Aderoju, Marin Olp, MD   ADMISSION DIAGNOSIS:   Acute pulmonary edema (HCC) [J81.0]  DISCHARGE DIAGNOSIS:   Principal Problem:   New onset of congestive heart failure (HCC) Active Problems:   HLD (hyperlipidemia)   Acid reflux   Accelerated hypertension   CHF (congestive heart failure) (HCC)   SECONDARY DIAGNOSIS:   Past Medical History:  Diagnosis Date  . Bilateral carpal tunnel syndrome 07/27/14  . Bipolar disorder [facility])    geropsych admission to [facility] in Dec 2018  . Chronic low back pain 07/29/14  . Degenerative arthritis of lumbar spine 07/27/14  . Depression   . Esophageal spasm 07/29/14  . GERD (gastroesophageal reflux disease)   . Hyperlipemia 07/29/14  . Hypertension   . Spinal stenosis 07/29/14    HOSPITAL COURSE:   82 year old female with past medical history significant for hypertension, GERD, severe bipolar disorder with depression and mania with psychosis in the past requiring ECT treatments comes to hospital secondary to worsening shortness of breath.  1. New onset congestive heart failure-admitted,  -Received IV Lasix. Echocardiogram showing diastolic dysfunction and left ventricular hypertrophy, EF of 55%. -Appreciate cardiology consult. Patient already on losartan at home, metoprolol and Lasix added. Will be discharged on oral Lasix at this time. -On admission she required 2 L oxygen via nasal cannula, now weaned off of oxygen.  2. Elevated troponin-secondary to demand ischemia. Denies any chest pain. - also plateaued -Outpatient stress test recommended by cardiology in 1-2 weeks  3. Bipolar disorder with depression and anxiety-follows with Dr. Toni Amend as outpatient. Last visit was  04/06/2017. -Follow-up visit in 3 weeks recommended at that time. ECT restarted intermittently - outpatient follow up.  -On Effexor and Remeron -Discussed with her psychiatrist, patient can follow-up with him as outpatient within 1 week  4. GERD-on PPI  5. UTI-on oral Keflex. Stopped at discharge as it was started on 04/17/2017 as outpatient  Ambulating decently in the hospital. will be discharged home today    DISCHARGE CONDITIONS:   Guarded  CONSULTS OBTAINED:   Treatment Team:  Nahser, Deloris Ping, MD  DRUG ALLERGIES:   Allergies  Allergen Reactions  . Asa [Aspirin] Other (See Comments)    Unknown reaction  . Sulfa Antibiotics Other (See Comments)    Unknown reaction   DISCHARGE MEDICATIONS:   Allergies as of 04/23/2017      Reactions   Asa [aspirin] Other (See Comments)   Unknown reaction   Sulfa Antibiotics Other (See Comments)   Unknown reaction      Medication List    STOP taking these medications   cephALEXin 500 MG capsule Commonly known as:  KEFLEX   hydrOXYzine 50 MG tablet Commonly known as:  ATARAX/VISTARIL     TAKE these medications   acetaminophen-codeine 300-30 MG tablet Commonly known as:  TYLENOL #3 Take 1 tablet by mouth every 8 (eight) hours as needed for moderate pain or severe pain.   furosemide 20 MG tablet Commonly known as:  LASIX Take 1 tablet (20 mg total) by mouth daily.   losartan 25 MG tablet Commonly known as:  COZAAR Take 1 tablet (25 mg total) by mouth daily.   loxapine 25 MG capsule Commonly known as:  Ocie Bob  Take 1 capsule (25 mg total) by mouth at bedtime.   metoprolol tartrate 25 MG tablet Commonly known as:  LOPRESSOR Take 1 tablet (25 mg total) by mouth 2 (two) times daily.   mirtazapine 45 MG tablet Commonly known as:  REMERON Take 1 tablet (45 mg total) by mouth at bedtime.   omeprazole 20 MG capsule Commonly known as:  PRILOSEC Take 20 mg by mouth 2 (two) times daily before a meal.   simvastatin  40 MG tablet Commonly known as:  ZOCOR Take 40 mg by mouth at bedtime.   venlafaxine XR 150 MG 24 hr capsule Commonly known as:  EFFEXOR-XR Take 1 capsule (150 mg total) by mouth daily with breakfast.        DISCHARGE INSTRUCTIONS:   1. PCP follow-up in 1-2 weeks 2. Cardiology follow-up in 1 week 3. Psychiatry follow-up as prior recommended  DIET:   Cardiac diet  ACTIVITY:   Activity as tolerated  OXYGEN:   Home Oxygen: No.  Oxygen Delivery: room air  DISCHARGE LOCATION:   home   If you experience worsening of your admission symptoms, develop shortness of breath, life threatening emergency, suicidal or homicidal thoughts you must seek medical attention immediately by calling 911 or calling your MD immediately  if symptoms less severe.  You Must read complete instructions/literature along with all the possible adverse reactions/side effects for all the Medicines you take and that have been prescribed to you. Take any new Medicines after you have completely understood and accpet all the possible adverse reactions/side effects.   Please note  You were cared for by a hospitalist during your hospital stay. If you have any questions about your discharge medications or the care you received while you were in the hospital after you are discharged, you can call the unit and asked to speak with the hospitalist on call if the hospitalist that took care of you is not available. Once you are discharged, your primary care physician will handle any further medical issues. Please note that NO REFILLS for any discharge medications will be authorized once you are discharged, as it is imperative that you return to your primary care physician (or establish a relationship with a primary care physician if you do not have one) for your aftercare needs so that they can reassess your need for medications and monitor your lab values.    On the day of Discharge:  VITAL SIGNS:   Blood pressure  (!) 154/60, pulse (!) 101, temperature 97.6 F (36.4 C), temperature source Oral, resp. rate 18, weight 62.4 kg (137 lb 9.6 oz), SpO2 97 %.  PHYSICAL EXAMINATION:    GENERAL:  82 y.o.-year-old patient lying in the bed with no acute distress.  EYES: Pupils equal, round, reactive to light and accommodation. No scleral icterus. Extraocular muscles intact.  HEENT: Head atraumatic, normocephalic. Oropharynx and nasopharynx clear.  NECK:  Supple, no jugular venous distention. No thyroid enlargement, no tenderness.  LUNGS: Normal breath sounds bilaterally, no wheezing, rhonchi or crepitation. Fine bibasilar crackles at the bases.  No use of accessory muscles of respiration.  CARDIOVASCULAR: S1, S2 normal. No  rubs, or gallops. Soft systolic murmur heard ABDOMEN: Soft, nontender, nondistended. Bowel sounds present. No organomegaly or mass.  EXTREMITIES: No pedal edema, cyanosis, or clubbing.  NEUROLOGIC: Cranial nerves II through XII are intact. Muscle strength 5/5 in all extremities. Sensation intact. Gait not checked. Global weakness noted. PSYCHIATRIC: The patient is alert and oriented x 3. Flat affect, it gets anxious  at times SKIN: No obvious rash, lesion, or ulcer.     DATA REVIEW:   CBC Recent Labs  Lab 04/21/17 0922  WBC 7.8  HGB 10.6*  HCT 31.4*  PLT 231    Chemistries  Recent Labs  Lab 04/20/17 2257  04/23/17 0333  NA 134*   < > 134*  K 3.8   < > 3.7  CL 101   < > 97*  CO2 24   < > 24  GLUCOSE 153*   < > 103*  BUN 15   < > 27*  CREATININE 0.80   < > 0.92  CALCIUM 8.8*   < > 9.1  AST 25  --   --   ALT 10*  --   --   ALKPHOS 88  --   --   BILITOT 0.7  --   --    < > = values in this interval not displayed.     Microbiology Results  Results for orders placed or performed during the hospital encounter of 04/16/17  Urine Culture     Status: Abnormal   Collection Time: 04/17/17  1:47 AM  Result Value Ref Range Status   Specimen Description   Final    URINE,  CLEAN CATCH Performed at [facility], [address] [place], [place] [address]    Special Requests   Final    Normal Performed at [facility], [address] [place], [place] [address]    Culture (A)  Final    <10,000 COLONIES/mL INSIGNIFICANT GROWTH Performed at [facility], [address] [place], [place] [address]    Report Status 04/18/2017 FINAL  Final    RADIOLOGY:  No results found.   Management plans discussed with the patient, family and they are in agreement.  CODE STATUS:     Code Status Orders  (From admission, onward)        Start     Ordered   04/21/17 0921  Full code  Continuous     04/21/17 0921    Code Status History    Date Active Date Inactive Code Status Order ID Comments User Context   10/03/2015 13:40 10/15/2015 20:06 Full Code [id number]  Katharina CaperVaickute, Rima, MD Inpatient   09/17/2014 03:01 09/26/2014 19:29 Full Code [id number]  Clapacs, Jackquline DenmarkJohn T, MD Inpatient      TOTAL TIME TAKING CARE OF THIS PATIENT: 38 minutes.    Enid BaasKALISETTI,Roma Bondar M.D on 04/23/2017 at 9:38 AM  Between 7am to 6pm - Pager - [phone]  After 6pm go to [email or web address] - Social research officer, governmentpassword [email or web address]  [facility]  Office  [phone]  CC: Primary care physician; Aderoju, Marin OlpElizabeth Oyeyemi, MD   Note: This dictation was prepared with Dragon dictation along with smaller phrase technology. Any transcriptional errors that result from this process are unintentional.

## 2017-04-23 NOTE — Progress Notes (Signed)
IV and tele removed from patient. Discharge instructions given to patient and husband. Verbalized understanding. No distress at this time. Husband to transport patient home.

## 2017-04-23 NOTE — Telephone Encounter (Signed)
Patient contacted regarding discharge [facility] on 04/23/17.   Patient husband answered, patient not with him. He was just getting done picking up her medications.  He understands to follow up with provider ? [facility].  Husband understands discharge instructions? Yes Husband understands medications and regiment? Yes Husband understands to bring all medications to this visit? Yes

## 2017-04-23 NOTE — Care Management Note (Signed)
Case Management Note  Patient Details  Name: Jenna DaftJoan M Keeter MRN: [id number] Date of Birth: 1935-08-20  Subjective/Objective:  Admitted with new diagnosis of CHF. Spoke with patients spouse regarding discharge planning. He states he has a set of scales. Patient uses a cane intermittantly. Offered choice of home health agencies. [facility]. Referral to CHF clinic. PCP Dr. Micheline ChapmanAderoju, last seen 3 weeks ago. No need for acute home O2.                    Action/Plan: [facility] for HHPT/HHRN.   Expected Discharge Date:  04/23/17               Expected Discharge Plan:  Home w Home Health Services  In-House Referral:     Discharge planning Services  CM Consult  Post Acute Care Choice:  Home Health Choice offered to:  Spouse  DME Arranged:    DME Agency:     HH Arranged:  PT, RN HH Agency:  [facility]  Status of Service:  Completed, signed off  If discussed at Long Length of Stay Meetings, dates discussed:    Additional Comments:  Marily MemosLisa M Jie Stickels, RN 04/23/2017, 10:02 AM

## 2017-04-23 NOTE — Telephone Encounter (Signed)
-----   Message from Coralee RudSabrina F Gilley sent at 04/23/2017  9:56 AM EST ----- Regarding: tcm/ph 2/5 2:00  Nicolasa Duckinghristopher Berge, NP

## 2017-04-27 ENCOUNTER — Other Ambulatory Visit: Payer: Self-pay | Admitting: Psychiatry

## 2017-04-30 ENCOUNTER — Encounter: Payer: Self-pay | Admitting: Anesthesiology

## 2017-04-30 ENCOUNTER — Encounter
Admission: RE | Admit: 2017-04-30 | Discharge: 2017-04-30 | Disposition: A | Discharge status: Home / Self Care | Payer: Medicare Other | Source: Ambulatory Visit | Attending: Psychiatry | Admitting: Psychiatry

## 2017-04-30 DIAGNOSIS — M545 Low back pain: Secondary | ICD-10-CM | POA: Diagnosis not present

## 2017-04-30 DIAGNOSIS — I110 Hypertensive heart disease with heart failure: Secondary | ICD-10-CM | POA: Insufficient documentation

## 2017-04-30 DIAGNOSIS — K219 Gastro-esophageal reflux disease without esophagitis: Secondary | ICD-10-CM | POA: Insufficient documentation

## 2017-04-30 DIAGNOSIS — Z823 Family history of stroke: Secondary | ICD-10-CM | POA: Diagnosis not present

## 2017-04-30 DIAGNOSIS — Z8249 Family history of ischemic heart disease and other diseases of the circulatory system: Secondary | ICD-10-CM | POA: Insufficient documentation

## 2017-04-30 DIAGNOSIS — F332 Major depressive disorder, 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 features: Secondary | ICD-10-CM

## 2017-04-30 DIAGNOSIS — M4800 Spinal stenosis, site unspecified: Secondary | ICD-10-CM | POA: Insufficient documentation

## 2017-04-30 DIAGNOSIS — I509 Heart failure, unspecified: Secondary | ICD-10-CM | POA: Diagnosis not present

## 2017-04-30 DIAGNOSIS — Z87891 Personal history of nicotine dependence: Secondary | ICD-10-CM | POA: Diagnosis not present

## 2017-04-30 DIAGNOSIS — G8929 Other chronic pain: Secondary | ICD-10-CM | POA: Diagnosis not present

## 2017-04-30 DIAGNOSIS — G5603 Carpal tunnel syndrome, bilateral upper limbs: Secondary | ICD-10-CM | POA: Insufficient documentation

## 2017-04-30 MED ORDER — SODIUM CHLORIDE 0.9 % IV SOLN
500.0000 mL | Freq: Once | INTRAVENOUS | Status: AC
  Administered 2017-04-30: 500 mL via INTRAVENOUS

## 2017-04-30 MED ORDER — METHOHEXITAL SODIUM 0.5 G IJ SOLR
INTRAMUSCULAR | Status: AC
  Filled 2017-04-30: qty 500

## 2017-04-30 MED ORDER — SODIUM CHLORIDE 0.9 % IV SOLN
INTRAVENOUS | Status: DC | PRN
  Administered 2017-04-30: 10:00:00 via INTRAVENOUS

## 2017-04-30 MED ORDER — KETOROLAC TROMETHAMINE 30 MG/ML IJ SOLN
30.0000 mg | Freq: Once | INTRAMUSCULAR | Status: AC
  Administered 2017-04-30: 30 mg via INTRAVENOUS

## 2017-04-30 MED ORDER — KETOROLAC TROMETHAMINE 30 MG/ML IJ SOLN
INTRAMUSCULAR | Status: AC
  Filled 2017-04-30: qty 1

## 2017-04-30 NOTE — Anesthesia Post-op Follow-up Note (Signed)
Anesthesia QCDR form completed.        

## 2017-04-30 NOTE — Procedures (Signed)
ECT SERVICES Physician's Interval Evaluation & Treatment Note  Patient Identification: Jenna Dudley MRN:  [id number] Date of Evaluation:  04/30/2017 TX #: 2  MADRS:   MMSE:   P.E. Findings:  No change to physical exam.  Lungs are clear.  Heart sounds normal.  Psychiatric Interval Note:  A little more anxious but better than before we started this series.  Subjective:  Patient is a 82 y.o. female seen for evaluation for Electroconvulsive Therapy. No particular complaint  Treatment Summary:   [x]   Right Unilateral             []  Bilateral   % Energy : 0.3 ms 100%   Impedance: 1500 ohms  Seizure Energy Index: 4222 V squared  Postictal Suppression Index: 80%  Seizure Concordance Index: 86%  Medications  Pre Shock: Toradol 30 mg labetalol 20 mg Brevital 60 mg succinylcholine 80 mg  Post Shock:    Seizure Duration: 45 seconds by EMG 69 seconds by EEG   Comments: Follow-up in 4 weeks  Lungs:  [x]   Clear to auscultation               []  Other:   Heart:    [x]   Regular rhythm             []  irregular rhythm    [x]   Previous H&P reviewed, patient examined and there are NO CHANGES                 []   Previous H&P reviewed, patient examined and there are changes noted.   Mordecai RasmussenJohn Clapacs, MD 2/4/201910:11 AM

## 2017-04-30 NOTE — H&P (Signed)
Jenna Dudley is an 82 y.o. female.   Chief Complaint: Patient has no current chief complaint HPI: Recurrent severe depression.  Also recently had an episode of heart failure that resulted in a brief hospitalization but appears to have stabilized at this point.  Past Medical History:  Diagnosis Date  . Bilateral carpal tunnel syndrome 07/27/14  . Bipolar disorder [facility](HCC)    geropsych admission to [facility] in Dec 2018  . Chronic low back pain 07/29/14  . Degenerative arthritis of lumbar spine 07/27/14  . Depression   . Esophageal spasm 07/29/14  . GERD (gastroesophageal reflux disease)   . Hyperlipemia 07/29/14  . Hypertension   . Spinal stenosis 07/29/14    Past Surgical History:  Procedure Laterality Date  . HIP SURGERY Right   . INNER EAR SURGERY Right   . REPLACEMENT TOTAL KNEE BILATERAL Bilateral 07/27/14    Family History  Problem Relation Age of Onset  . Hypertension Mother   . Stroke Mother    Social History:  reports that she quit smoking about 41 years ago. Her smoking use included cigarettes. she has never used smokeless tobacco. She reports that she does not drink alcohol or use drugs.  Allergies:  Allergies  Allergen Reactions  . Asa [Aspirin] Other (See Comments)    Unknown reaction  . Sulfa Antibiotics Other (See Comments)    Unknown reaction     (Not in a hospital admission)  No results found for this or any previous visit (from the past 48 hour(s)). No results found.  Review of Systems  Constitutional: Negative.   HENT: Negative.   Eyes: Negative.   Respiratory: Negative.   Cardiovascular: Negative.   Gastrointestinal: Negative.   Musculoskeletal: Negative.   Skin: Negative.   Neurological: Negative.   Psychiatric/Behavioral: Negative for depression, hallucinations, memory loss, substance abuse and suicidal ideas. The patient is not nervous/anxious and does not have insomnia.     Blood pressure 131/72, pulse (!) 107, temperature 97.9 F (36.6 C),  temperature source Oral, resp. rate 18, height 5' (1.524 m), weight 62.6 kg (138 lb), SpO2 99 %. Physical Exam  Nursing note and vitals reviewed. Constitutional: She appears well-developed and well-nourished.  HENT:  Head: Normocephalic and atraumatic.  Eyes: Conjunctivae are normal. Pupils are equal, round, and reactive to light.  Neck: Normal range of motion.  Cardiovascular: Regular rhythm and normal heart sounds.  Respiratory: Effort normal and breath sounds normal. No respiratory distress. She has no wheezes. She has no rales.  GI: Soft.  Musculoskeletal: Normal range of motion.  Neurological: She is alert.  Skin: Skin is warm and dry.  Psychiatric: She has a normal mood and affect. Her behavior is normal. Judgment and thought content normal.     Assessment/Plan Lungs are clear heart sounds normal.  No current shortness of breath.  Patient seems mentally stable.  We will have treatment today and then anticipate a likely follow-up in about a month  Mordecai RasmussenJohn Jenna Gadbois, MD 04/30/2017, 10:09 AM

## 2017-04-30 NOTE — Discharge Instructions (Signed)
1)  The drugs that you have been given will stay in your system until tomorrow so for the       next 24 hours you should not:  A. Drive an automobile  B. Make any legal decisions  C. Drink any alcoholic beverages  2)  You may resume your regular meals upon return home.  3)  A responsible adult must take you home.  Someone should stay with you for a few          hours, then be available by phone for the remainder of the treatment day.  4)  You May experience any of the following symptoms:  Headache, Nausea and a dry mouth (due to the medications you were given),  temporary memory loss and some confusion, or sore muscles (a warm bath  should help this).  If you you experience any of these symptoms let us know on                your return visit.  5)  Report any of the following: any acute discomfort, severe headache, or temperature        greater than 100.5 F.   Also report any unusual redness, swelling, drainage, or pain         at your IV site.    You may report Symptoms to:  ECT PROGRAM- [place] [facility]          Phone: [phone], ECT Department           or Dr. Shary Keylapac's office [phone]  6)  Your next ECT Treatment is Day Monday  Date March 4th 2019  We will call 2 days prior to your scheduled appointment for arrival times.  7)  Nothing to eat or drink after midnight the night before your procedure.  8)  Take metoprolol   With a sip of water the morning of your procedure.  9)  Other Instructions: Call [phone] to cancel the morning of your procedure due         to illness or emergency.  10) We will call within 72 hours to assess how you are feeling.

## 2017-04-30 NOTE — Transfer of Care (Signed)
Immediate Anesthesia Transfer of Care Note  Patient: Jenna DaftJoan M Nicklaus  Procedure(s) Performed: ECT TX  Patient Location: PACU  Anesthesia Type:General  Level of Consciousness: sedated  Airway & Oxygen Therapy: Patient connected to face mask oxygen  Post-op Assessment: Post -op Vital signs reviewed and stable  Post vital signs: stable  Last Vitals:  Vitals:   04/30/17 0836  BP: 131/72  Pulse: (!) 107  Resp: 18  Temp: 36.6 C  SpO2: 99%    Last Pain:  Vitals:   04/30/17 0836  TempSrc: Oral         Complications: No apparent anesthesia complications

## 2017-04-30 NOTE — Anesthesia Preprocedure Evaluation (Signed)
Anesthesia Evaluation  Patient identified by MRN, date of birth, ID band Patient awake    Reviewed: Allergy & Precautions, NPO status , Patient's Chart, lab work & pertinent test results, reviewed documented beta blocker date and time   Airway Mallampati: II  TM Distance: >3 FB     Dental  (+) Chipped   Pulmonary former smoker,           Cardiovascular hypertension, Pt. on medications +CHF       Neuro/Psych PSYCHIATRIC DISORDERS Depression Bipolar Disorder  Neuromuscular disease    GI/Hepatic GERD  Controlled,  Endo/Other    Renal/GU Renal disease     Musculoskeletal  (+) Arthritis , Osteoarthritis,    Abdominal   Peds  Hematology   Anesthesia Other Findings Past Medical History: 07/27/14: Bilateral carpal tunnel syndrome No date: Bipolar disorder [facility](HCC)     Comment:  geropsych admission [facility] in Dec 2018 07/29/14: Chronic low back pain 07/27/14: Degenerative arthritis of lumbar spine No date: Depression 07/29/14: Esophageal spasm No date: GERD (gastroesophageal reflux disease) 07/29/14: Hyperlipemia No date: Hypertension 07/29/14: Spinal stenosis  Reproductive/Obstetrics                             Anesthesia Physical  Anesthesia Plan  ASA: III  Anesthesia Plan: General   Post-op Pain Management:    Induction: Intravenous  PONV Risk Score and Plan:   Airway Management Planned: Mask  Additional Equipment:   Intra-op Plan:   Post-operative Plan:   Informed Consent: I have reviewed the patients History and Physical, chart, labs and discussed the procedure including the risks, benefits and alternatives for the proposed anesthesia with the patient or authorized representative who has indicated his/her understanding and acceptance.     Plan Discussed with: CRNA  Anesthesia Plan Comments:         Anesthesia Quick Evaluation

## 2017-04-30 NOTE — Anesthesia Postprocedure Evaluation (Signed)
Anesthesia Post Note  Patient: Jenna Dudley  Procedure(s) Performed: ECT TX  Patient location during evaluation: PACU Anesthesia Type: General Level of consciousness: awake and alert and oriented Pain management: pain level controlled Vital Signs Assessment: post-procedure vital signs reviewed and stable Respiratory status: spontaneous breathing Cardiovascular status: blood pressure returned to baseline Anesthetic complications: no     Last Vitals:  Vitals:   04/30/17 1100 04/30/17 1104  BP:  (!) 127/50  Pulse: 87 86  Resp: (!) 22 (!) 6  Temp:    SpO2: 94%     Last Pain:  Vitals:   04/30/17 0836  TempSrc: Oral                 Diamonte Stavely

## 2017-05-01 ENCOUNTER — Ambulatory Visit (INDEPENDENT_AMBULATORY_CARE_PROVIDER_SITE_OTHER): Payer: Medicare Other | Admitting: Nurse Practitioner

## 2017-05-01 ENCOUNTER — Encounter: Payer: Self-pay | Admitting: Nurse Practitioner

## 2017-05-01 VITALS — BP 150/80 | HR 88 | Ht 60.0 in | Wt 136.5 lb

## 2017-05-01 DIAGNOSIS — I10 Essential (primary) hypertension: Secondary | ICD-10-CM | POA: Diagnosis not present

## 2017-05-01 DIAGNOSIS — I5030 Unspecified diastolic (congestive) heart failure: Secondary | ICD-10-CM

## 2017-05-01 DIAGNOSIS — R748 Abnormal levels of other serum enzymes: Secondary | ICD-10-CM

## 2017-05-01 DIAGNOSIS — E782 Mixed hyperlipidemia: Secondary | ICD-10-CM

## 2017-05-01 DIAGNOSIS — R778 Other specified abnormalities of plasma proteins: Secondary | ICD-10-CM

## 2017-05-01 DIAGNOSIS — R7989 Other specified abnormal findings of blood chemistry: Secondary | ICD-10-CM

## 2017-05-01 MED ORDER — METOPROLOL TARTRATE 50 MG PO TABS: 50.0000 mg | ORAL_TABLET | Freq: Two times a day (BID) | ORAL | 3 refills | Status: DC

## 2017-05-01 NOTE — Patient Instructions (Signed)
Medication Instructions:  Your physician has recommended you make the following change in your medication:  1- INCREASE Metoprolol tartrate to 50 mg by mouth two times a day.   Labwork: Your physician recommends that you return for lab work in: TODAY (BMET).   Testing/Procedures: none  Follow-Up: Your physician recommends that you schedule a follow-up appointment in: 1 MONTH WITH TINA IN HEART FAILURE CLINIC. PLEASE RESCHEDULE CURRENT appointment.    Your physician recommends that you schedule a follow-up appointment in: 3 MONTHS WITH DR ARIDA.   If you need a refill on your cardiac medications before your next appointment, please call your pharmacy.

## 2017-05-01 NOTE — Progress Notes (Signed)
Office Visit    Patient Name: Jenna Dudley Date of Encounter: 05/01/2017  Primary Care Provider:  Micheline ChapmanAderoju, Marin OlpElizabeth Oyeyemi, Dudley Primary Cardiologist:  Jenna Dudley  Chief Complaint    82 year old female with a history of bipolar disorder, hypertension, hyperlipidemia, GERD, chronic low back pain, and recent diagnosis of diastolic heart failure, who presents for hospital follow-up.  Past Medical History    Past Medical History:  Diagnosis Date  . Bilateral carpal tunnel syndrome 07/27/14  . Bipolar disorder [facility](HCC)    geropsych admission to [facility] in Dec 2018  . Chronic diastolic CHF (congestive heart failure) (HCC)    a. 03/2017 Echo: EF 55-60%, no rwma, Gr1 DD, mild MR.  . Chronic low back pain 07/29/14  . Degenerative arthritis of lumbar spine 07/27/14  . Dementia   . Depression   . Esophageal spasm 07/29/14  . GERD (gastroesophageal reflux disease)   . Hyperlipemia 07/29/14  . Hypertension   . Spinal stenosis 07/29/14   Past Surgical History:  Procedure Laterality Date  . HIP SURGERY Right   . INNER EAR SURGERY Right   . REPLACEMENT TOTAL KNEE BILATERAL Bilateral 07/27/14    Allergies  Allergies  Allergen Reactions  . Aspirin Other (See Comments)    Unknown reaction  . Sulfa Antibiotics Other (See Comments)    Unknown reaction    History of Present Illness    82 year old female with the above complex past medical history including bipolar disorder, dementia, hyperlipidemia, hypertension, GERD, and chronic low back pain.  She was recently admitted to [facility] with a one-week history of dyspnea and found to be volume overloaded.  She was admitted and placed on IV Lasix.  Echocardiogram showed normal LV function with grade 1 diastolic dysfunction.  Troponin was mildly elevated at 0.04-0.06.  She was not felt to be a good candidate for ischemic evaluation secondary to dementia and in the setting of normal LV function on echo, further cardiac evaluation was  deferred.  Following adequate diuresis, she was discharged home on Lasix 20 mg daily.  Since discharge, her husband has been weighing her daily and her weight is been stable.  She has not had any chest pain, dyspnea, palpitations, PND, orthopnea, dizziness, syncope, edema, or early satiety.  Home Medications    Prior to Admission medications   Medication Sig Start Date End Date Taking? Authorizing Provider  acetaminophen-codeine (TYLENOL #3) 300-30 MG per tablet Take 1 tablet by mouth every 8 (eight) hours as needed for moderate pain or severe pain.   Yes Jenna Dudley  furosemide (LASIX) 20 MG tablet Take 1 tablet (20 mg total) by mouth daily. 04/23/17  Yes Jenna Dudley  losartan (COZAAR) 25 MG tablet Take 1 tablet (25 mg total) by mouth daily. 09/26/14  Yes Jenna Dudley  loxapine (LOXITANE) 25 MG capsule Take 1 capsule (25 mg total) by mouth at bedtime. 03/29/17  Yes Jenna Dudley  mirtazapine (REMERON) 45 MG tablet Take 1 tablet (45 mg total) by mouth at bedtime. 03/29/17  Yes Jenna Dudley  omeprazole (PRILOSEC) 20 MG capsule Take 20 mg by mouth 2 (two) times daily before a meal.    Yes Jenna Dudley  simvastatin (ZOCOR) 40 MG tablet Take 40 mg by mouth at bedtime.    Yes Jenna Dudley  venlafaxine XR (EFFEXOR-XR) 150 MG 24 hr capsule Take 1 capsule (150 mg total) by mouth daily with breakfast. 03/29/17  Yes Jenna Dudley  metoprolol tartrate (LOPRESSOR) 50 MG tablet Take 1 tablet (50 mg total) by mouth 2 (two) times daily. 05/01/17 07/30/17  Jenna Dudley    Review of Systems    She denies chest pain, palpitations, dyspnea, pnd, orthopnea, n, v, dizziness, syncope, edema, weight gain, or early satiety.  All other systems reviewed and are otherwise negative except as noted above.  Physical Exam    VS:  BP (!) 150/80 (BP Location: Left Arm, Patient Position: Sitting, Cuff Size: Normal)   Pulse 88   Ht 5' (1.524  m)   Wt 136 lb 8 oz (61.9 kg)   LMP  (LMP Unknown)   BMI 26.66 kg/m  , BMI Body mass index is 26.66 kg/m. GEN: Well nourished, well developed, in no acute distress.  HEENT: normal.  Neck: Supple, no JVD, carotid bruits, or masses. Cardiac: RRR, 2/6 systolic murmur heard throughout, no rubs, or gallops. No clubbing, cyanosis, edema.  Radials/DP/PT 2+ and equal bilaterally.  Respiratory:  Respirations regular and unlabored, clear to auscultation bilaterally. GI: Soft, nontender, nondistended, BS + x 4. MS: no deformity or atrophy. Skin: warm and dry, no rash. Neuro:  Strength and sensation are intact. Psych: Normal affect.  Accessory Clinical Findings    ECG -regular sinus rhythm, 88, PAC, nonspecific T changes.  Assessment & Plan    1.  Chronic diastolic congestive heart failure: Patient was recently admitted with volume overload.  Echo showed normal LV function with grade 1 diastolic dysfunction and she responded to diuretics well.  She has been stable at home without recurrent symptoms.  Weight has been stable.  Blood pressure is elevated today at 150/80.  I am going to increase her metoprolol to 50 mg twice daily.  She will remain on losartan and Lasix.  I will follow-up a basic metabolic panel today.  2.  Elevated troponin:  Mild trop elevation in setting of CHF during hospitalization.  0.04  0.06.  She denies any h/o chest pain.  Though she was felt that she would not be a good candidate for an invasive evaluation, stress testing was recommended at discharge.  I will arrange this.    3.  Essential hypertension: See #1.  Adjusting metoprolol.  4.  Hyperlipidemia: She is on statin therapy with an LDL of 105 in April 2018.  5.  Bipolar disorder: Followed by primary care.  6.  Disposition: Follow-up with heart failure clinic in 1 month.  Follow-up here in 3 months or sooner if necessary.   Nicolasa Ducking, Dudley 05/01/2017, 5:07 PM

## 2017-05-02 ENCOUNTER — Ambulatory Visit: Payer: Medicare Other | Admitting: Family

## 2017-05-02 ENCOUNTER — Telehealth: Payer: Self-pay | Admitting: *Deleted

## 2017-05-02 DIAGNOSIS — R778 Other specified abnormalities of plasma proteins: Secondary | ICD-10-CM

## 2017-05-02 DIAGNOSIS — R7989 Other specified abnormal findings of blood chemistry: Principal | ICD-10-CM

## 2017-05-02 DIAGNOSIS — I509 Heart failure, unspecified: Secondary | ICD-10-CM

## 2017-05-02 LAB — BASIC METABOLIC PANEL
BUN / CREAT RATIO: 16 (ref 12–28)
BUN: 15 mg/dL (ref 8–27)
CHLORIDE: 98 mmol/L (ref 96–106)
CO2: 18 mmol/L — ABNORMAL LOW (ref 20–29)
Calcium: 9 mg/dL (ref 8.7–10.3)
Creatinine, Ser: 0.92 mg/dL (ref 0.57–1.00)
GFR calc non Af Amer: 59 mL/min/{1.73_m2} — ABNORMAL LOW (ref >59)
GFR, EST AFRICAN AMERICAN: 68 mL/min/{1.73_m2} (ref >59)
GLUCOSE: 95 mg/dL (ref 65–99)
POTASSIUM: 3.8 mmol/L (ref 3.5–5.2)
Sodium: 136 mmol/L (ref 134–144)

## 2017-05-02 NOTE — Telephone Encounter (Signed)
-----   Message from Creig Hineshristopher Ronald Berge, NP sent at 05/01/2017  5:25 PM EST ----- Redgie GrayerHey Jennifer.  Though Nahser said he wouldn't study Ms. Ezzard StandingLunney any further, it turns out that [place] would like a nuc study on her.  I placed an order for a lexiscan MV.  Would you mind reaching out to her husband to set up.  The indication is elevated troponin in the setting of chf.  Thanks.

## 2017-05-02 NOTE — Telephone Encounter (Signed)
No answer. Left message to call back.   

## 2017-05-02 NOTE — Telephone Encounter (Signed)
Patient's husband called back. He verbalized understanding of testing and plan of care. He also verbalized understanding of the following instructions as well. Sent to precert.   [facility] LEXISCAN MYOVIEW  Your caregiver has ordered a Stress Test with nuclear imaging. The purpose of this test is to evaluate the blood supply to your heart muscle. This procedure is referred to as a "Non-Invasive Stress Test." This is because other than having an IV started in your vein, nothing is inserted or "invades" your body. Cardiac stress tests are done to find areas of poor blood flow to the heart by determining the extent of coronary artery disease (CAD). Some patients exercise on a treadmill, which naturally increases the blood flow to your heart, while others who are  unable to walk on a treadmill due to physical limitations have a pharmacologic/chemical stress agent called Lexiscan . This medicine will mimic walking on a treadmill by temporarily increasing your coronary blood flow.   Please note: these test may take anywhere between 2-4 hours to complete  [facility]  THE VOLUNTEERS AT THE FIRST DESK WILL DIRECT YOU WHERE TO GO  Date of Procedure:_______02/12/19_______  Arrival Time for Procedure:______07:45 AM__________  Instructions regarding medication:   _XX_:  Hold other medications as follows:______FUROSEMIDE_______  PLEASE NOTIFY THE OFFICE AT LEAST 24 HOURS IN ADVANCE IF YOU ARE UNABLE TO KEEP YOUR APPOINTMENT.  [phone] AND  PLEASE NOTIFY NUCLEAR MEDICINE AT [facility] AT LEAST 24 HOURS IN ADVANCE IF YOU ARE UNABLE TO KEEP YOUR APPOINTMENT. [phone]  How to prepare for your Myoview test:  1. Do not eat or drink after midnight 2. No caffeine for 24 hours prior to test 3. No smoking 24 hours prior to test. 4. Your medication may be taken with water.  If your doctor stopped a medication because of this test, do not take that medication. 5. Ladies, please do  not wear dresses.  Skirts or pants are appropriate. Please wear a short sleeve shirt. 6. No perfume, cologne or lotion. 7. Wear comfortable walking shoes. No heels!

## 2017-05-08 ENCOUNTER — Encounter
Admission: RE | Admit: 2017-05-08 | Discharge: 2017-05-08 | Disposition: A | Discharge status: Home / Self Care | Payer: Medicare Other | Source: Ambulatory Visit | Attending: Nurse Practitioner | Admitting: Nurse Practitioner

## 2017-05-08 DIAGNOSIS — I509 Heart failure, unspecified: Secondary | ICD-10-CM

## 2017-05-08 DIAGNOSIS — R9439 Abnormal result of other cardiovascular function study: Secondary | ICD-10-CM | POA: Diagnosis not present

## 2017-05-08 DIAGNOSIS — R7989 Other specified abnormal findings of blood chemistry: Secondary | ICD-10-CM

## 2017-05-08 DIAGNOSIS — R748 Abnormal levels of other serum enzymes: Secondary | ICD-10-CM | POA: Diagnosis present

## 2017-05-08 DIAGNOSIS — R778 Other specified abnormalities of plasma proteins: Secondary | ICD-10-CM

## 2017-05-08 LAB — NM MYOCAR MULTI W/SPECT W/WALL MOTION / EF
CSEPHR: 82 %
LV dias vol: 77 mL (ref 46–106)
LV sys vol: 40 mL
NUC STRESS TID: 1.15
Peak HR: 117 {beats}/min
Rest HR: 73 {beats}/min

## 2017-05-08 MED ORDER — TECHNETIUM TC 99M TETROFOSMIN IV KIT
13.0000 | PACK | Freq: Once | INTRAVENOUS | Status: AC | PRN
  Administered 2017-05-08: 13.27 via INTRAVENOUS

## 2017-05-08 MED ORDER — TECHNETIUM TC 99M TETROFOSMIN IV KIT
30.0000 | PACK | Freq: Once | INTRAVENOUS | Status: AC | PRN
  Administered 2017-05-08: 32.05 via INTRAVENOUS

## 2017-05-08 MED ORDER — REGADENOSON 0.4 MG/5ML IV SOLN
0.4000 mg | Freq: Once | INTRAVENOUS | Status: AC
  Administered 2017-05-08: 0.4 mg via INTRAVENOUS
  Filled 2017-05-08: qty 5

## 2017-05-28 ENCOUNTER — Other Ambulatory Visit: Payer: Self-pay | Admitting: Psychiatry

## 2017-05-28 NOTE — Progress Notes (Signed)
Patient ID: Jenna Dudley, female    DOB: February 10, 1936, 82 y.o.   MRN: [id number]  HPI  Jenna Dudley is a 82 y/o female with a history of hyperlipidemia, HTN, spinal stenosis, GERD, depression, bipolar, dementia, remote tobacco use and chronic heart failure.   Echo report from 04/22/17 reviewed and showed an EF of 55-60% along with mild MR.   Admitted 04/20/17 due to acute pulmonary edema. Initially needed IV diuretics and then transitioned to oral diuretics. Cardiology consult obtained. Discharged after 3 days. Was in the ED 04/16/17 due to a UTI where she was treated and released. Was in the ED 04/01/17 due to back pain but left without being seen.   She presents today for her initial visit with a chief complaint of moderate fatigue upon minimal exertion. She says this has been present for years with varying levels of severity. She has associated shortness of breath and occasional lightheadedness along with this. She denies any difficulty sleeping, abdominal distention, palpitations, edema, chest pain, cough or weight gain.   Past Medical History:  Diagnosis Date  . Bilateral carpal tunnel syndrome 07/27/14  . Bipolar disorder [facility])    geropsych admission to [facility] in Dec 2018  . Chronic diastolic CHF (congestive heart failure) (HCC)    a. 03/2017 Echo: EF 55-60%, no rwma, Gr1 DD, mild MR.  . Chronic low back pain 07/29/14  . Degenerative arthritis of lumbar spine 07/27/14  . Dementia   . Depression   . Esophageal spasm 07/29/14  . GERD (gastroesophageal reflux disease)   . Hyperlipemia 07/29/14  . Hypertension   . Spinal stenosis 07/29/14   Past Surgical History:  Procedure Laterality Date  . HIP SURGERY Right   . INNER EAR SURGERY Right   . REPLACEMENT TOTAL KNEE BILATERAL Bilateral 07/27/14   Family History  Problem Relation Age of Onset  . Hypertension Mother   . Stroke Mother    Social History   Tobacco Use  . Smoking status: Former Smoker    Types: Cigarettes    Last attempt to  quit: 07/29/1975    Years since quitting: 41.8  . Smokeless tobacco: Never Used  Substance Use Topics  . Alcohol use: No    Alcohol/week: 0.0 oz   Allergies  Allergen Reactions  . Aspirin Other (See Comments)    Unknown reaction  . Sulfa Antibiotics Other (See Comments)    Unknown reaction   Prior to Admission medications   Medication Sig Start Date End Date Taking? Authorizing Provider  acetaminophen-codeine (TYLENOL #3) 300-30 MG per tablet Take 1 tablet by mouth every 8 (eight) hours as needed for moderate pain or severe pain.   Yes [provider]  furosemide (LASIX) 20 MG tablet Take 1 tablet (20 mg total) by mouth daily. 04/23/17  Yes Jenna Baas, MD  losartan (COZAAR) 25 MG tablet Take 1 tablet (25 mg total) by mouth daily. 09/26/14  Yes Jenna [facility], Jenna B, MD  loxapine (LOXITANE) 25 MG capsule Take 1 capsule (25 mg total) by mouth at bedtime. 03/29/17  Yes Jenna [facility], Jenna Denmark, MD  metoprolol tartrate (LOPRESSOR) 50 MG tablet Take 1 tablet (50 mg total) by mouth 2 (two) times daily. 05/01/17 07/30/17 Yes Jenna Hines, Jenna Dudley  mirtazapine (REMERON) 45 MG tablet Take 1 tablet (45 mg total) by mouth at bedtime. 03/29/17  Yes Jenna [facility]ley, Jenna Denmark, MD  omeprazole (PRILOSEC) 20 MG capsule Take 20 mg by mouth 2 (two) times daily before a meal.    Yes [provider]  simvastatin (ZOCOR) 40 MG tablet Take 40 mg by mouth at bedtime.    Yes [provider]  venlafaxine XR (EFFEXOR-XR) 150 MG 24 hr capsule Take 1 capsule (150 mg total) by mouth daily with breakfast. 03/29/17  Yes [facility]enna [facility], Jenna DenmarkJohn T, MD    Review of Systems  Constitutional: Positive for fatigue (tire easily).  HENT: Negative for congestion, postnasal drip and sore throat.   Eyes: Negative.   Respiratory: Positive for shortness of breath (at times). Negative for cough and chest tightness.   Cardiovascular: Negative for chest pain, palpitations and leg swelling.  Gastrointestinal: Negative for abdominal  distention and abdominal pain.  Endocrine: Negative.   Genitourinary: Negative.   Musculoskeletal: Negative for back pain and neck pain.  Skin: Negative.   Allergic/Immunologic: Negative.   Neurological: Positive for light-headedness (on occasion). Negative for dizziness.  Hematological: Negative for adenopathy. Does not bruise/bleed easily.  Psychiatric/Behavioral: Negative for dysphoric mood and sleep disturbance (sleeping on 2 pillows). The patient is not nervous/anxious.     Vitals:   05/29/17 1114  BP: (!) 148/51  Pulse: 79  Resp: 18  SpO2: 99%  Weight: 140 lb 8 oz (63.7 kg)  Height: 5' (1.524 m)   Wt Readings from Last 3 Encounters:  05/29/17 140 lb 8 oz (63.7 kg)  05/01/17 136 lb 8 oz (61.9 kg)  04/23/17 137 lb 9.6 oz (62.4 kg)   Lab Results  Component Value Date   CREATININE 0.92 05/01/2017   CREATININE 0.92 04/23/2017   CREATININE 0.76 04/22/2017   Physical Exam  Constitutional: She is oriented to person, place, and time. She appears well-developed and well-nourished.  HENT:  Head: Normocephalic and atraumatic.  Neck: Normal range of motion. Neck supple. No JVD present.  Cardiovascular: Normal rate and regular rhythm.  Pulmonary/Chest: Effort normal. She has no wheezes. She has no rales.  Abdominal: Soft. She exhibits no distension. There is no tenderness.  Musculoskeletal: She exhibits no edema or tenderness.  Neurological: She is alert and oriented to person, place, and time.  Skin: Skin is warm and dry.  Psychiatric: She has a normal mood and affect. Her behavior is normal.  Nursing note and vitals reviewed.  Assessment & Plan:  1: Chronic heart failure with preserved ejection fraction- - NYHA class III - euvolemic - weighing daily and she says that her weight has been stable. Instructed to call for an overnight weight gain of >2 pounds or a weekly weight gain of >5 pounds - not adding salt and her husband says that he's been reading food labels.  Discussed the importance of reading food labels and to keep her daily sodium intake to 2000mg . Written dietary information was given to her about this - saw cardiology Brion Aliment(Berge) 05/01/17 and returns 08/02/17 - BNP 04/20/17 was 555.0 - PharmD reconciled medications with the patient and her husband - patient reports receiving her flu vaccine for this season  2: HTN-  - saw PCP (Aderoju) 05/04/17 - BMP from 05/01/17 reviewed and showed sodium 136, potassium 3.8 and GFR 59  3: Bipolar- - saw psychiatry (Jenna [facility]) 03/29/17  - receives ECT Mon, Wed & Fri  Patient did not bring her medications nor a list. Each medication was verbally reviewed with the patient and she was encouraged to bring the bottles to every visit to confirm accuracy of list.  Return in 1 month or sooner for any questions/problems before then.

## 2017-05-29 ENCOUNTER — Encounter: Payer: Self-pay | Admitting: Family

## 2017-05-29 ENCOUNTER — Other Ambulatory Visit: Payer: Self-pay | Admitting: Psychiatry

## 2017-05-29 ENCOUNTER — Ambulatory Visit: Payer: Medicare Other | Attending: Family | Admitting: Family

## 2017-05-29 VITALS — BP 148/51 | HR 79 | Resp 18 | Ht 60.0 in | Wt 140.5 lb

## 2017-05-29 DIAGNOSIS — Z79899 Other long term (current) drug therapy: Secondary | ICD-10-CM | POA: Diagnosis not present

## 2017-05-29 DIAGNOSIS — F319 Bipolar disorder, unspecified: Secondary | ICD-10-CM | POA: Insufficient documentation

## 2017-05-29 DIAGNOSIS — I110 Hypertensive heart disease with heart failure: Secondary | ICD-10-CM | POA: Diagnosis present

## 2017-05-29 DIAGNOSIS — G5603 Carpal tunnel syndrome, bilateral upper limbs: Secondary | ICD-10-CM | POA: Insufficient documentation

## 2017-05-29 DIAGNOSIS — Z87891 Personal history of nicotine dependence: Secondary | ICD-10-CM | POA: Insufficient documentation

## 2017-05-29 DIAGNOSIS — K219 Gastro-esophageal reflux disease without esophagitis: Secondary | ICD-10-CM | POA: Diagnosis not present

## 2017-05-29 DIAGNOSIS — I509 Heart failure, unspecified: Secondary | ICD-10-CM | POA: Insufficient documentation

## 2017-05-29 DIAGNOSIS — I10 Essential (primary) hypertension: Secondary | ICD-10-CM

## 2017-05-29 DIAGNOSIS — Z886 Allergy status to analgesic agent status: Secondary | ICD-10-CM | POA: Diagnosis not present

## 2017-05-29 DIAGNOSIS — Z882 Allergy status to sulfonamides status: Secondary | ICD-10-CM | POA: Insufficient documentation

## 2017-05-29 DIAGNOSIS — I5032 Chronic diastolic (congestive) heart failure: Secondary | ICD-10-CM

## 2017-05-29 NOTE — Patient Instructions (Signed)
Continue weighing daily and call for an overnight weight gain of > 2 pounds or a weekly weight gain of >5 pounds. 

## 2017-05-30 ENCOUNTER — Encounter: Payer: Self-pay | Admitting: Anesthesiology

## 2017-05-30 ENCOUNTER — Encounter
Admission: RE | Admit: 2017-05-30 | Discharge: 2017-05-30 | Disposition: A | Discharge status: Home / Self Care | Payer: Medicare Other | Source: Ambulatory Visit | Attending: Psychiatry | Admitting: Psychiatry

## 2017-05-30 DIAGNOSIS — Z823 Family history of stroke: Secondary | ICD-10-CM | POA: Insufficient documentation

## 2017-05-30 DIAGNOSIS — E785 Hyperlipidemia, unspecified: Secondary | ICD-10-CM | POA: Insufficient documentation

## 2017-05-30 DIAGNOSIS — F0390 Unspecified dementia without behavioral disturbance: Secondary | ICD-10-CM | POA: Diagnosis not present

## 2017-05-30 DIAGNOSIS — Z882 Allergy status to sulfonamides status: Secondary | ICD-10-CM | POA: Insufficient documentation

## 2017-05-30 DIAGNOSIS — I110 Hypertensive heart disease with heart failure: Secondary | ICD-10-CM | POA: Insufficient documentation

## 2017-05-30 DIAGNOSIS — Z87891 Personal history of nicotine dependence: Secondary | ICD-10-CM | POA: Diagnosis not present

## 2017-05-30 DIAGNOSIS — M545 Low back pain: Secondary | ICD-10-CM | POA: Insufficient documentation

## 2017-05-30 DIAGNOSIS — I5032 Chronic diastolic (congestive) heart failure: Secondary | ICD-10-CM | POA: Insufficient documentation

## 2017-05-30 DIAGNOSIS — Z8249 Family history of ischemic heart disease and other diseases of the circulatory system: Secondary | ICD-10-CM | POA: Insufficient documentation

## 2017-05-30 DIAGNOSIS — Z886 Allergy status to analgesic agent status: Secondary | ICD-10-CM | POA: Insufficient documentation

## 2017-05-30 DIAGNOSIS — Z96653 Presence of artificial knee joint, bilateral: Secondary | ICD-10-CM | POA: Insufficient documentation

## 2017-05-30 DIAGNOSIS — K219 Gastro-esophageal reflux disease without esophagitis: Secondary | ICD-10-CM | POA: Insufficient documentation

## 2017-05-30 DIAGNOSIS — F332 Major depressive disorder, 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 features: Secondary | ICD-10-CM

## 2017-05-30 DIAGNOSIS — G8929 Other chronic pain: Secondary | ICD-10-CM | POA: Insufficient documentation

## 2017-05-30 MED ORDER — KETOROLAC TROMETHAMINE 30 MG/ML IJ SOLN
30.0000 mg | Freq: Once | INTRAMUSCULAR | Status: AC
  Administered 2017-05-30: 30 mg via INTRAVENOUS

## 2017-05-30 MED ORDER — METHOHEXITAL SODIUM 0.5 G IJ SOLR
INTRAMUSCULAR | Status: AC
  Filled 2017-05-30: qty 500

## 2017-05-30 MED ORDER — SODIUM CHLORIDE 0.9 % IV SOLN
500.0000 mL | Freq: Once | INTRAVENOUS | Status: AC
  Administered 2017-05-30: 500 mL via INTRAVENOUS

## 2017-05-30 MED ORDER — ONDANSETRON HCL 4 MG/2ML IJ SOLN: 4.0000 mg | Freq: Once | INTRAMUSCULAR | Status: DC | PRN

## 2017-05-30 MED ORDER — LABETALOL HCL 5 MG/ML IV SOLN
INTRAVENOUS | Status: AC
  Filled 2017-05-30: qty 4

## 2017-05-30 MED ORDER — METHOHEXITAL SODIUM 100 MG/10ML IV SOSY
PREFILLED_SYRINGE | INTRAVENOUS | Status: DC | PRN
  Administered 2017-05-30: 60 mg via INTRAVENOUS

## 2017-05-30 MED ORDER — SUCCINYLCHOLINE CHLORIDE 200 MG/10ML IV SOSY
PREFILLED_SYRINGE | INTRAVENOUS | Status: DC | PRN
  Administered 2017-05-30: 80 mg via INTRAVENOUS

## 2017-05-30 MED ORDER — LABETALOL HCL 5 MG/ML IV SOLN
INTRAVENOUS | Status: DC | PRN
  Administered 2017-05-30: 20 mg via INTRAVENOUS

## 2017-05-30 MED ORDER — SODIUM CHLORIDE 0.9 % IV SOLN
INTRAVENOUS | Status: DC | PRN
  Administered 2017-05-30: 10:00:00 via INTRAVENOUS

## 2017-05-30 MED ORDER — FENTANYL CITRATE (PF) 100 MCG/2ML IJ SOLN: 25.0000 ug | INTRAMUSCULAR | Status: DC | PRN

## 2017-05-30 MED ORDER — SUCCINYLCHOLINE CHLORIDE 20 MG/ML IJ SOLN
INTRAMUSCULAR | Status: AC
  Filled 2017-05-30: qty 1

## 2017-05-30 MED ORDER — KETOROLAC TROMETHAMINE 30 MG/ML IJ SOLN
INTRAMUSCULAR | Status: AC
  Administered 2017-05-30: 30 mg via INTRAVENOUS
  Filled 2017-05-30: qty 1

## 2017-05-30 NOTE — Anesthesia Post-op Follow-up Note (Signed)
Anesthesia QCDR form completed.        

## 2017-05-30 NOTE — H&P (Signed)
Jenna Dudley is an 82 y.o. female.   Chief Complaint: Patient has no specific new complaint.  Back pain under good control.  Not having depression no sign of psychosis HPI: History of recurrent severe psychotic depression which is well maintained on ECT and medicine  Past Medical History:  Diagnosis Date  . Bilateral carpal tunnel syndrome 07/27/14  . Bipolar disorder [facility](HCC)    geropsych admission to [place] in Dec 2018  . Chronic diastolic CHF (congestive heart failure) (HCC)    a. 03/2017 Echo: EF 55-60%, no rwma, Gr1 DD, mild MR.  . Chronic low back pain 07/29/14  . Degenerative arthritis of lumbar spine 07/27/14  . Dementia   . Depression   . Esophageal spasm 07/29/14  . GERD (gastroesophageal reflux disease)   . Hyperlipemia 07/29/14  . Hypertension   . Spinal stenosis 07/29/14    Past Surgical History:  Procedure Laterality Date  . HIP SURGERY Right   . INNER EAR SURGERY Right   . REPLACEMENT TOTAL KNEE BILATERAL Bilateral 07/27/14    Family History  Problem Relation Age of Onset  . Hypertension Mother   . Stroke Mother    Social History:  reports that she quit smoking about 41 years ago. Her smoking use included cigarettes. she has never used smokeless tobacco. She reports that she does not drink alcohol or use drugs.  Allergies:  Allergies  Allergen Reactions  . Aspirin Other (See Comments)    Unknown reaction  . Sulfa Antibiotics Other (See Comments)    Unknown reaction     (Not in a hospital admission)  No results found for this or any previous visit (from the past 48 hour(s)). No results found.  Review of Systems  Constitutional: Negative.   HENT: Negative.   Eyes: Negative.   Respiratory: Negative.   Cardiovascular: Negative.   Gastrointestinal: Negative.   Musculoskeletal: Negative.   Skin: Negative.   Neurological: Negative.   Psychiatric/Behavioral: Negative.     Blood pressure (!) 130/43, pulse 78, temperature 97.8 F (36.6 C), resp. rate 18,  height 5' (1.524 m), weight 63 kg (139 lb), SpO2 97 %. Physical Exam  Nursing note and vitals reviewed. Constitutional: She appears well-developed and well-nourished.  HENT:  Head: Normocephalic and atraumatic.  Eyes: Conjunctivae are normal. Pupils are equal, round, and reactive to light.  Neck: Normal range of motion.  Cardiovascular: Regular rhythm and normal heart sounds.  Respiratory: Effort normal. No respiratory distress.  GI: Soft.  Musculoskeletal: Normal range of motion.  Neurological: She is alert.  Skin: Skin is warm and dry.  Psychiatric: Judgment normal. Her affect is blunt. Her speech is delayed. She is slowed. Thought content is not paranoid. Cognition and memory are impaired. She expresses no homicidal and no suicidal ideation.     Assessment/Plan Treatment today and I will probably look forward to about 5 or 6 weeks for next scheduled appointment  Mordecai RasmussenJohn Zoie Sarin, MD 05/30/2017, 10:06 AM

## 2017-05-30 NOTE — Procedures (Signed)
ECT SERVICES Physician's Interval Evaluation & Treatment Note  Patient Identification: Jenna Dudley MRN:  [id number] Date of Evaluation:  05/30/2017 TX #: 3  MADRS:   MMSE:   P.E. Findings:  Physical normal.  Vitals normal.  She has a little bit of an earache and has cotton in her right ear but nothing else remarkable  Psychiatric Interval Note:  Mood is good.  No sign of confusion or delirium or psychosis  Subjective:  Patient is a 82 y.o. female seen for evaluation for Electroconvulsive Therapy. No specific complaints  Treatment Summary:   [x]   Right Unilateral             [x]  Bilateral   % Energy : 0.3 ms 100%   Impedance: 1690 ohms   Seizure Energy Index: 4168 V squared  Postictal Suppression Index: 13%  Seizure Concordance Index: 85%  Medications  Pre Shock: Toradol 30 mg labetalol 20 mg Brevital 60 mg succinylcholine 80 mg  Post Shock:   Seizure Duration: 29 seconds EMG 63 seconds EEG   Comments: Follow-up in 5 weeks on April 10  Lungs:  [x]   Clear to auscultation               []  Other:   Heart:    [x]   Regular rhythm             []  irregular rhythm    [x]   Previous H&P reviewed, patient examined and there are NO CHANGES                 []   Previous H&P reviewed, patient examined and there are changes noted.   Mordecai RasmussenJohn Orestes Geiman, MD 3/6/201910:08 AM

## 2017-05-30 NOTE — Anesthesia Procedure Notes (Signed)
Date/Time: 05/30/2017 10:18 AM Performed by: Lily KocherPeralta, Briana Farner, CRNA Pre-anesthesia Checklist: Patient identified, Emergency Drugs available, Suction available and Patient being monitored Patient Re-evaluated:Patient Re-evaluated prior to induction Oxygen Delivery Method: Circle system utilized Preoxygenation: Pre-oxygenation with 100% oxygen Induction Type: IV induction Ventilation: Mask ventilation without difficulty and Mask ventilation throughout procedure Airway Equipment and Method: Bite block Placement Confirmation: positive ETCO2 Dental Injury: Teeth and Oropharynx as per pre-operative assessment

## 2017-05-30 NOTE — Transfer of Care (Signed)
Immediate Anesthesia Transfer of Care Note  Patient: Jenna DaftJoan M Dudley  Procedure(s) Performed: ECT TX  Patient Location: PACU  Anesthesia Type:General  Level of Consciousness: sedated  Airway & Oxygen Therapy: Patient Spontanous Breathing and Patient connected to face mask oxygen  Post-op Assessment: Report given to RN and Post -op Vital signs reviewed and stable  Post vital signs: Reviewed and stable  Last Vitals:  Vitals:   05/30/17 0843 05/30/17 1030  BP: (!) 130/43 (!) 121/44  Pulse: 78 73  Resp: 18 16  Temp: 36.6 C   SpO2: 97% 100%    Last Pain: There were no vitals filed for this visit.       Complications: No apparent anesthesia complications

## 2017-05-30 NOTE — Anesthesia Preprocedure Evaluation (Signed)
Anesthesia Evaluation  Patient identified by MRN, date of birth, ID band Patient awake    Reviewed: Allergy & Precautions, NPO status , Patient's Chart, lab work & pertinent test results, reviewed documented beta blocker date and time   Airway Mallampati: II  TM Distance: >3 FB     Dental  (+) Chipped   Pulmonary former smoker,           Cardiovascular hypertension, Pt. on medications +CHF       Neuro/Psych PSYCHIATRIC DISORDERS Depression Bipolar Disorder  Neuromuscular disease    GI/Hepatic GERD  Controlled,  Endo/Other    Renal/GU Renal disease     Musculoskeletal  (+) Arthritis , Osteoarthritis,    Abdominal   Peds  Hematology   Anesthesia Other Findings Past Medical History: 07/27/14: Bilateral carpal tunnel syndrome No date: Bipolar disorder (HCC)     Comment:  geropsych admission to [place] in Dec 2018 07/29/14: Chronic low back pain 07/27/14: Degenerative arthritis of lumbar spine No date: Depression 07/29/14: Esophageal spasm No date: GERD (gastroesophageal reflux disease) 07/29/14: Hyperlipemia No date: Hypertension 07/29/14: Spinal stenosis  Reproductive/Obstetrics                             Anesthesia Physical  Anesthesia Plan  ASA: III  Anesthesia Plan: General   Post-op Pain Management:    Induction: Intravenous  PONV Risk Score and Plan:   Airway Management Planned: Mask  Additional Equipment:   Intra-op Plan:   Post-operative Plan:   Informed Consent: I have reviewed the patients History and Physical, chart, labs and discussed the procedure including the risks, benefits and alternatives for the proposed anesthesia with the patient or authorized representative who has indicated his/her understanding and acceptance.     Plan Discussed with: CRNA  Anesthesia Plan Comments:         Anesthesia Quick Evaluation  

## 2017-05-30 NOTE — Discharge Instructions (Signed)
1)  The drugs that you have been given will stay in your system until tomorrow so for the       next 24 hours you should not:  A. Drive an automobile  B. Make any legal decisions  C. Drink any alcoholic beverages  2)  You may resume your regular meals upon return home.  3)  A responsible adult must take you home.  Someone should stay with you for a few          hours, then be available by phone for the remainder of the treatment day.  4)  You May experience any of the following symptoms:  Headache, Nausea and a dry mouth (due to the medications you were given),  temporary memory loss and some confusion, or sore muscles (a warm bath  should help this).  If you you experience any of these symptoms let us know on                your return visit.  5)  Report any of the following: any acute discomfort, severe headache, or temperature        greater than 100.5 F.   Also report any unusual redness, swelling, drainage, or pain         at your IV site.    You may report Symptoms to:  ECT PROGRAM- [place] [facility]          Phone: [phone], ECT Department           or Dr. Shary Keylapac's office [phone]  6)  Your next ECT Treatment is Wednesday April 10   We will call 2 days prior to your scheduled appointment for arrival times.  7)  Nothing to eat or drink after midnight the night before your procedure.  8)  Take    With a sip of water the morning of your procedure.  9)  Other Instructions: Call [phone] to cancel the morning of your procedure due         to illness or emergency.  10) We will call within 72 hours to assess how you are feeling.

## 2017-05-30 NOTE — Anesthesia Postprocedure Evaluation (Signed)
Anesthesia Post Note  Patient: Jenna Dudley  Procedure(s) Performed: ECT TX  Patient location during evaluation: PACU Anesthesia Type: General Level of consciousness: awake and alert and oriented Pain management: pain level controlled Vital Signs Assessment: post-procedure vital signs reviewed and stable Respiratory status: spontaneous breathing Cardiovascular status: blood pressure returned to baseline Anesthetic complications: no     Last Vitals:  Vitals:   05/30/17 1055 05/30/17 1106  BP: (!) 180/77 (!) 141/71  Pulse: 90   Resp: (!) 27 18  Temp: 37.4 C   SpO2: 95%     Last Pain: There were no vitals filed for this visit.               Jazmon Kos

## 2017-06-27 NOTE — Progress Notes (Signed)
Patient ID: Jenna Dudley, female    DOB: 1935/12/28, 82 y.o.   MRN: [id number]  HPI  Ms Dittman is a 82 y/o female with a history of hyperlipidemia, HTN, spinal stenosis, GERD, depression, bipolar, dementia, remote tobacco use and chronic heart failure.   Echo report from 04/22/17 reviewed and showed an EF of 55-60% along with mild MR.   Admitted 04/20/17 due to acute pulmonary edema. Initially needed IV diuretics and then transitioned to oral diuretics. Cardiology consult obtained. Discharged after 3 days. Was in the ED 04/16/17 due to a UTI where she was treated and released. Was in the ED 04/01/17 due to back pain but left without being seen.   She presents today for a follow-up visit with a chief complaint of moderate fatigue upon minimal exertion. She says this has been chronic in nature having been present for several years. She has associated shortness of breath along with this. She denies any cough, dizziness, chest pain, edema, palpitations, abdominal distention, difficulty sleeping or weight gain.   Past Medical History:  Diagnosis Date  . Bilateral carpal tunnel syndrome 07/27/14  . Bipolar disorder [facility])    geropsych admission to [facility] in Dec 2018  . Chronic diastolic CHF (congestive heart failure) (HCC)    a. 03/2017 Echo: EF 55-60%, no rwma, Gr1 DD, mild MR.  . Chronic low back pain 07/29/14  . Degenerative arthritis of lumbar spine 07/27/14  . Dementia   . Depression   . Esophageal spasm 07/29/14  . GERD (gastroesophageal reflux disease)   . Hyperlipemia 07/29/14  . Hypertension   . Spinal stenosis 07/29/14   Past Surgical History:  Procedure Laterality Date  . HIP SURGERY Right   . INNER EAR SURGERY Right   . REPLACEMENT TOTAL KNEE BILATERAL Bilateral 07/27/14   Family History  Problem Relation Age of Onset  . Hypertension Mother   . Stroke Mother    Social History   Tobacco Use  . Smoking status: Former Smoker    Types: Cigarettes    Last attempt to quit: 07/29/1975   Years since quitting: 41.9  . Smokeless tobacco: Never Used  Substance Use Topics  . Alcohol use: No    Alcohol/week: 0.0 oz   Allergies  Allergen Reactions  . Aspirin Other (See Comments)    Unknown reaction  . Sulfa Antibiotics Other (See Comments)    Unknown reaction   Prior to Admission medications   Medication Sig Start Date End Date Taking? Authorizing Provider  acetaminophen-codeine (TYLENOL #3) 300-30 MG per tablet Take 1 tablet by mouth every 8 (eight) hours as needed for moderate pain or severe pain.   Yes [provider]  furosemide (LASIX) 20 MG tablet Take 1 tablet (20 mg total) by mouth daily. 04/23/17  Yes Enid Baas, MD  losartan (COZAAR) 25 MG tablet Take 1 tablet (25 mg total) by mouth daily. 09/26/14  Yes Pucilowska, Jolanta B, MD  loxapine (LOXITANE) 25 MG capsule Take 1 capsule (25 mg total) by mouth at bedtime. 03/29/17  Yes Clapacs, Jackquline Denmark, MD  metoprolol tartrate (LOPRESSOR) 50 MG tablet Take 1 tablet (50 mg total) by mouth 2 (two) times daily. 05/01/17 07/30/17 Yes Creig Hines, NP  mirtazapine (REMERON) 45 MG tablet Take 1 tablet (45 mg total) by mouth at bedtime. 03/29/17  Yes Clapacs, Jackquline Denmark, MD  omeprazole (PRILOSEC) 20 MG capsule Take 20 mg by mouth 2 (two) times daily before a meal.    Yes [provider]  simvastatin (ZOCOR) 40 MG tablet Take 40 mg by mouth at bedtime.    Yes [provider]  venlafaxine XR (EFFEXOR-XR) 150 MG 24 hr capsule Take 1 capsule (150 mg total) by mouth daily with breakfast. 03/29/17  Yes Clapacs, Jackquline DenmarkJohn T, MD   Review of Systems  Constitutional: Positive for fatigue (tire easily). Negative for appetite change.  HENT: Negative for congestion, postnasal drip and sore throat.   Eyes: Negative.   Respiratory: Positive for shortness of breath (at times). Negative for cough and chest tightness.   Cardiovascular: Negative for chest pain, palpitations and leg swelling.  Gastrointestinal: Negative for  abdominal distention and abdominal pain.  Endocrine: Negative.   Genitourinary: Negative.   Musculoskeletal: Negative for back pain and neck pain.  Skin: Negative.   Allergic/Immunologic: Negative.   Neurological: Negative for dizziness and light-headedness.  Hematological: Negative for adenopathy. Does not bruise/bleed easily.  Psychiatric/Behavioral: Negative for dysphoric mood and sleep disturbance (sleeping on 2 pillows). The patient is not nervous/anxious.    Vitals:   06/28/17 1045  BP: (!) 152/71  Pulse: 78  Resp: 18  SpO2: 97%  Weight: 136 lb (61.7 kg)  Height: 5' (1.524 m)   Wt Readings from Last 3 Encounters:  06/28/17 136 lb (61.7 kg)  05/29/17 140 lb 8 oz (63.7 kg)  05/01/17 136 lb 8 oz (61.9 kg)   Lab Results  Component Value Date   CREATININE 0.92 05/01/2017   CREATININE 0.92 04/23/2017   CREATININE 0.76 04/22/2017   Physical Exam  Constitutional: She is oriented to person, place, and time. She appears well-developed and well-nourished.  HENT:  Head: Normocephalic and atraumatic.  Neck: Normal range of motion. Neck supple. No JVD present.  Cardiovascular: Normal rate and regular rhythm.  Pulmonary/Chest: Effort normal. She has no wheezes. She has no rales.  Abdominal: Soft. She exhibits no distension. There is no tenderness.  Musculoskeletal: She exhibits no edema or tenderness.  Neurological: She is alert and oriented to person, place, and time.  Skin: Skin is warm and dry.  Psychiatric: She has a normal mood and affect. Her behavior is normal.  Nursing note and vitals reviewed.  Assessment & Plan:  1: Chronic heart failure with preserved ejection fraction- - NYHA class III - euvolemic - weighing daily and she says that her weight has been stable. Reminded to call for an overnight weight gain of >2 pounds or a weekly weight gain of >5 pounds - weight down 4 pounds since 05/29/17 - not adding salt and her husband says that he's been reading food labels.  Reviewed the importance of closely following a 2000mg  sodium diet - saw cardiology Brion Aliment([facility]) 05/01/17 and returns 08/02/17 - BNP 04/20/17 was 555.0 - PharmD reconciled medications with the patient and her husband - patient reports receiving her flu vaccine for this season  2: HTN-  - saw PCP (Aderoju) 05/04/17 - BMP from 05/01/17 reviewed and showed sodium 136, potassium 3.8 and GFR 59  3: Bipolar- - saw psychiatry (Clapacs) 03/29/17  - next ECT treatment is next week  Patient did not bring her medications nor a list. Each medication was verbally reviewed with the patient and she was encouraged to bring the bottles to every visit to confirm accuracy of list.  Did not make a follow-up appointment. Advised patient and her husband that they could call back at anytime in the future to schedule another appointment.

## 2017-06-28 ENCOUNTER — Encounter: Payer: Self-pay | Admitting: Family

## 2017-06-28 ENCOUNTER — Ambulatory Visit: Payer: Medicare Other | Attending: Family | Admitting: Family

## 2017-06-28 VITALS — BP 152/71 | HR 78 | Resp 18 | Ht 60.0 in | Wt 136.0 lb

## 2017-06-28 DIAGNOSIS — Z87891 Personal history of nicotine dependence: Secondary | ICD-10-CM | POA: Insufficient documentation

## 2017-06-28 DIAGNOSIS — I110 Hypertensive heart disease with heart failure: Secondary | ICD-10-CM | POA: Diagnosis present

## 2017-06-28 DIAGNOSIS — E785 Hyperlipidemia, unspecified: Secondary | ICD-10-CM | POA: Diagnosis not present

## 2017-06-28 DIAGNOSIS — M549 Dorsalgia, unspecified: Secondary | ICD-10-CM | POA: Insufficient documentation

## 2017-06-28 DIAGNOSIS — M4800 Spinal stenosis, site unspecified: Secondary | ICD-10-CM | POA: Insufficient documentation

## 2017-06-28 DIAGNOSIS — R0602 Shortness of breath: Secondary | ICD-10-CM | POA: Diagnosis not present

## 2017-06-28 DIAGNOSIS — F319 Bipolar disorder, unspecified: Secondary | ICD-10-CM | POA: Insufficient documentation

## 2017-06-28 DIAGNOSIS — N390 Urinary tract infection, site not specified: Secondary | ICD-10-CM | POA: Insufficient documentation

## 2017-06-28 DIAGNOSIS — I10 Essential (primary) hypertension: Secondary | ICD-10-CM

## 2017-06-28 DIAGNOSIS — K219 Gastro-esophageal reflux disease without esophagitis: Secondary | ICD-10-CM | POA: Diagnosis not present

## 2017-06-28 DIAGNOSIS — Z79899 Other long term (current) drug therapy: Secondary | ICD-10-CM | POA: Diagnosis not present

## 2017-06-28 DIAGNOSIS — I5032 Chronic diastolic (congestive) heart failure: Secondary | ICD-10-CM | POA: Insufficient documentation

## 2017-06-28 NOTE — Patient Instructions (Signed)
Continue weighing daily and call for an overnight weight gain of > 2 pounds or a weekly weight gain of >5 pounds. 

## 2017-07-06 ENCOUNTER — Encounter
Admission: RE | Admit: 2017-07-06 | Discharge: 2017-07-06 | Disposition: A | Discharge status: Home / Self Care | Payer: Medicare Other | Source: Ambulatory Visit | Attending: Psychiatry | Admitting: Psychiatry

## 2017-07-06 ENCOUNTER — Other Ambulatory Visit: Payer: Self-pay | Admitting: Psychiatry

## 2017-07-06 ENCOUNTER — Encounter: Payer: Self-pay | Admitting: Anesthesiology

## 2017-07-06 DIAGNOSIS — I5032 Chronic diastolic (congestive) heart failure: Secondary | ICD-10-CM | POA: Insufficient documentation

## 2017-07-06 DIAGNOSIS — F333 Major depressive disorder, recurrent, severe with psychotic symptoms: Secondary | ICD-10-CM | POA: Diagnosis present

## 2017-07-06 DIAGNOSIS — Z87891 Personal history of nicotine dependence: Secondary | ICD-10-CM | POA: Insufficient documentation

## 2017-07-06 DIAGNOSIS — E785 Hyperlipidemia, unspecified: Secondary | ICD-10-CM | POA: Insufficient documentation

## 2017-07-06 DIAGNOSIS — I110 Hypertensive heart disease with heart failure: Secondary | ICD-10-CM | POA: Diagnosis not present

## 2017-07-06 MED ORDER — LABETALOL HCL 5 MG/ML IV SOLN
INTRAVENOUS | Status: DC | PRN
  Administered 2017-07-06: 20 mg via INTRAVENOUS

## 2017-07-06 MED ORDER — FENTANYL CITRATE (PF) 100 MCG/2ML IJ SOLN: 25.0000 ug | INTRAMUSCULAR | Status: DC | PRN

## 2017-07-06 MED ORDER — LABETALOL HCL 5 MG/ML IV SOLN
INTRAVENOUS | Status: AC
  Filled 2017-07-06: qty 4

## 2017-07-06 MED ORDER — METHOHEXITAL SODIUM 0.5 G IJ SOLR
INTRAMUSCULAR | Status: AC
  Filled 2017-07-06: qty 500

## 2017-07-06 MED ORDER — ONDANSETRON HCL 4 MG/2ML IJ SOLN: 4.0000 mg | Freq: Once | INTRAMUSCULAR | Status: DC | PRN

## 2017-07-06 MED ORDER — KETOROLAC TROMETHAMINE 30 MG/ML IJ SOLN
INTRAMUSCULAR | Status: AC
  Filled 2017-07-06: qty 1

## 2017-07-06 MED ORDER — SODIUM CHLORIDE 0.9 % IV SOLN
500.0000 mL | Freq: Once | INTRAVENOUS | Status: AC
  Administered 2017-07-06: 10:00:00 via INTRAVENOUS

## 2017-07-06 MED ORDER — SUCCINYLCHOLINE CHLORIDE 20 MG/ML IJ SOLN
INTRAMUSCULAR | Status: DC | PRN
  Administered 2017-07-06: 80 mg via INTRAVENOUS

## 2017-07-06 MED ORDER — METHOHEXITAL SODIUM 100 MG/10ML IV SOSY
PREFILLED_SYRINGE | INTRAVENOUS | Status: DC | PRN
  Administered 2017-07-06: 60 mg via INTRAVENOUS

## 2017-07-06 NOTE — Discharge Instructions (Addendum)
1)  The drugs that you have been given will stay in your system until tomorrow so for the       next 24 hours you should not:  A. Drive an automobile  B. Make any legal decisions  C. Drink any alcoholic beverages  2)  You may resume your regular meals upon return home.  3)  A responsible adult must take you home.  Someone should stay with you for a few          hours, then be available by phone for the remainder of the treatment day.  4)  You May experience any of the following symptoms:  Headache, Nausea and a dry mouth (due to the medications you were given),  temporary memory loss and some confusion, or sore muscles (a warm bath  should help this).  If you you experience any of these symptoms let us know on                your return visit.  5)  Report any of the following: any acute discomfort, severe headache, or temperature        greater than 100.5 F.   Also report any unusual redness, swelling, drainage, or pain         at your IV site.    You may report Symptoms to:  ECT PROGRAM- [place] at [facility]          Phone: [phone], ECT Department           or Dr. Shary Keylapac's office [phone]  6)  Your next ECT Treatment is Wednesday May 1  We will call 2 days prior to your scheduled appointment for arrival times.  7)  Nothing to eat or drink after midnight the night before your procedure.  8)  Take     With a sip of water the morning of your procedure.  9)  Other Instructions: Call [phone] to cancel the morning of your procedure due         to illness or emergency.  10) We will call within 72 hours to assess how you are feeling.

## 2017-07-06 NOTE — Anesthesia Procedure Notes (Signed)
Procedure Name: General with mask airway Performed by: Irving BurtonBachich, Jalayne Ganesh, CRNA Pre-anesthesia Checklist: Patient identified, Emergency Drugs available, Suction available, Patient being monitored and Timeout performed Patient Re-evaluated:Patient Re-evaluated prior to induction Oxygen Delivery Method: Circle system utilized Preoxygenation: Pre-oxygenation with 100% oxygen Induction Type: IV induction Ventilation: Mask ventilation without difficulty Airway Equipment and Method: Bite block Placement Confirmation: positive ETCO2

## 2017-07-06 NOTE — Anesthesia Preprocedure Evaluation (Signed)
Anesthesia Evaluation  Patient identified by MRN, date of birth, ID band Patient awake    Reviewed: Allergy & Precautions, NPO status , Patient's Chart, lab work & pertinent test results, reviewed documented beta blocker date and time   Airway Mallampati: II  TM Distance: >3 FB     Dental  (+) Chipped   Pulmonary former smoker,           Cardiovascular hypertension, Pt. on medications +CHF       Neuro/Psych PSYCHIATRIC DISORDERS Depression Bipolar Disorder  Neuromuscular disease    GI/Hepatic GERD  Controlled,  Endo/Other    Renal/GU Renal disease     Musculoskeletal  (+) Arthritis , Osteoarthritis,    Abdominal   Peds  Hematology   Anesthesia Other Findings Past Medical History: 07/27/14: Bilateral carpal tunnel syndrome No date: Bipolar disorder [facility](HCC)     Comment:  [facility] 2018 07/29/14: Chronic low back pain 07/27/14: Degenerative arthritis of lumbar spine No date: Depression 07/29/14: Esophageal spasm No date: GERD (gastroesophageal reflux disease) 07/29/14: Hyperlipemia No date: Hypertension 07/29/14: Spinal stenosis  Reproductive/Obstetrics                             Anesthesia Physical  Anesthesia Plan  ASA: III  Anesthesia Plan: General   Post-op Pain Management:    Induction: Intravenous  PONV Risk Score and Plan:   Airway Management Planned: Mask  Additional Equipment:   Intra-op Plan:   Post-operative Plan:   Informed Consent: I have reviewed the patients History and Physical, chart, labs and discussed the procedure including the risks, benefits and alternatives for the proposed anesthesia with the patient or authorized representative who has indicated his/her understanding and acceptance.     Plan Discussed with: CRNA  Anesthesia Plan Comments:         Anesthesia Quick Evaluation

## 2017-07-06 NOTE — H&P (Signed)
Jenna Dudley is an 82 y.o. female.   Chief Complaint: Patient has no specific new complaint.  Mood is been stable.  Husband notes that she gets a little down in the evening. HPI: History of recurrent severe depression including multiple episodes of psychotic depression with good stability on medication and maintenance ECT  Past Medical History:  Diagnosis Date  . Bilateral carpal tunnel syndrome 07/27/14  . Bipolar disorder [facility](HCC)    [facility]  . Chronic diastolic CHF (congestive heart failure) (HCC)    a. 03/2017 Echo: EF 55-60%, no rwma, Gr1 DD, mild MR.  . Chronic low back pain 07/29/14  . Degenerative arthritis of lumbar spine 07/27/14  . Dementia   . Depression   . Esophageal spasm 07/29/14  . GERD (gastroesophageal reflux disease)   . Hyperlipemia 07/29/14  . Hypertension   . Spinal stenosis 07/29/14    Past Surgical History:  Procedure Laterality Date  . HIP SURGERY Right   . INNER EAR SURGERY Right   . REPLACEMENT TOTAL KNEE BILATERAL Bilateral 07/27/14    Family History  Problem Relation Age of Onset  . Hypertension Mother   . Stroke Mother    Social History:  reports that she quit smoking about 41 years ago. Her smoking use included cigarettes. She has never used smokeless tobacco. She reports that she does not drink alcohol or use drugs.  Allergies:  Allergies  Allergen Reactions  . Aspirin Other (See Comments)    Unknown reaction  . Sulfa Antibiotics Other (See Comments)    Unknown reaction     (Not in a hospital admission)  No results found for this or any previous visit (from the past 48 hour(s)). No results found.  Review of Systems  Constitutional: Negative.   HENT: Negative.   Eyes: Negative.   Respiratory: Negative.   Cardiovascular: Negative.   Gastrointestinal: Negative.   Musculoskeletal: Negative.   Skin: Negative.   Neurological: Negative.   Psychiatric/Behavioral: Positive for memory loss. Negative for  depression, hallucinations, substance abuse and suicidal ideas. The patient is not nervous/anxious and does not have insomnia.     Blood pressure (!) 146/81, pulse 82, temperature 98 F (36.7 C), temperature source Oral, resp. rate 18, height 5' (1.524 m), weight 61.7 kg (136 lb), SpO2 98 %. Physical Exam  Nursing note and vitals reviewed. Constitutional: She appears well-developed and well-nourished.  HENT:  Head: Normocephalic and atraumatic.  Eyes: Pupils are equal, round, and reactive to light. Conjunctivae are normal.  Neck: Normal range of motion.  Cardiovascular: Regular rhythm and normal heart sounds.  Respiratory: Effort normal. No respiratory distress.  GI: Soft.  Musculoskeletal: Normal range of motion.  Neurological: She is alert.  Skin: Skin is warm and dry.  Psychiatric: Judgment normal. Her affect is blunt. Her speech is delayed. She is slowed. Thought content is not paranoid. She expresses no homicidal and no suicidal ideation. She exhibits abnormal recent memory.     Assessment/Plan According to her husband things have been slightly worse the last week and a half so we will plan to see her back in 3 weeks after treatment today.  Mordecai RasmussenJohn Christoph Copelan, MD 07/06/2017, 10:13 AM

## 2017-07-06 NOTE — Procedures (Signed)
ECT SERVICES Physician's Interval Evaluation & Treatment Note  Patient Identification: Jenna Dudley MRN:  [id number] Date of Evaluation:  07/06/2017 TX #: 4  MADRS:   MMSE: 22  P.E. Findings:  No change to physical exam except for a little weight loss  Psychiatric Interval Note:  Mood seems to be stable today although she is a little more anxious than usual.  Channing MuttersRoy reports that she is having some depression at night  Subjective:  Patient is a 82 y.o. female seen for evaluation for Electroconvulsive Therapy. No specific complaint  Treatment Summary:   [x]   Right Unilateral             []  Bilateral   % Energy : 0.3 ms 100% 1510 ohms   Impedance: 1510 ohms  Seizure Energy Index: 3270 V squared  Postictal Suppression Index: 79%  Seizure Concordance Index: 87%  Medications  Pre Shock: Toradol 30 mg labetalol 20 mg Brevital 60 mg succinylcholine 80 mg  Post Shock:    Seizure Duration: 33 seconds by EMG 76 seconds by EEG   Comments: Follow-up 3 weeks  Lungs:  [x]   Clear to auscultation               []  Other:   Heart:    [x]   Regular rhythm             []  irregular rhythm    [x]   Previous H&P reviewed, patient examined and there are NO CHANGES                 []   Previous H&P reviewed, patient examined and there are changes noted.   Mordecai RasmussenJohn Clapacs, MD 4/12/201910:15 AM

## 2017-07-06 NOTE — Anesthesia Post-op Follow-up Note (Signed)
Anesthesia QCDR form completed.        

## 2017-07-06 NOTE — Transfer of Care (Signed)
Immediate Anesthesia Transfer of Care Note  Patient: Jenna Dudley  Procedure(s) Performed: ECT TX  Patient Location: PACU  Anesthesia Type:General  Level of Consciousness: sedated  Airway & Oxygen Therapy: Patient connected to face mask oxygen  Post-op Assessment: Post -op Vital signs reviewed and stable  Post vital signs: stable  Last Vitals:  Vitals Value Taken Time  BP 124/65 07/06/2017 10:32 AM  Temp 36.8 C 07/06/2017 10:32 AM  Pulse 75 07/06/2017 10:32 AM  Resp 33 07/06/2017 10:32 AM  SpO2 100 % 07/06/2017 10:32 AM  Vitals shown include unvalidated device data.  Last Pain:  Vitals:   07/06/17 0855  TempSrc: Oral  PainSc: 0-No pain         Complications: No apparent anesthesia complications

## 2017-07-10 NOTE — Anesthesia Postprocedure Evaluation (Signed)
Anesthesia Post Note  Patient: Jenna Dudley  Procedure(s) Performed: ECT TX  Patient location during evaluation: PACU Anesthesia Type: General Level of consciousness: awake and alert and oriented Pain management: pain level controlled Vital Signs Assessment: post-procedure vital signs reviewed and stable Respiratory status: spontaneous breathing Cardiovascular status: blood pressure returned to baseline Anesthetic complications: no     Last Vitals:  Vitals:   07/06/17 1102 07/06/17 1122  BP: (!) 149/64 132/68  Pulse: 78 78  Resp: (!) 30 16  Temp:  36.8 C  SpO2: 91%     Last Pain:  Vitals:   07/06/17 1122  TempSrc: Oral  PainSc: 0-No pain                 Dalaya Suppa

## 2017-07-16 ENCOUNTER — Other Ambulatory Visit: Payer: Self-pay | Admitting: Psychiatry

## 2017-07-20 ENCOUNTER — Telehealth: Payer: Self-pay | Admitting: *Deleted

## 2017-07-25 ENCOUNTER — Other Ambulatory Visit: Payer: Self-pay | Admitting: Psychiatry

## 2017-07-25 ENCOUNTER — Encounter: Payer: Self-pay | Admitting: Anesthesiology

## 2017-07-25 ENCOUNTER — Encounter
Admission: RE | Admit: 2017-07-25 | Discharge: 2017-07-25 | Disposition: A | Discharge status: Home / Self Care | Payer: Medicare Other | Source: Ambulatory Visit | Attending: Psychiatry | Admitting: Psychiatry

## 2017-07-25 DIAGNOSIS — G5603 Carpal tunnel syndrome, bilateral upper limbs: Secondary | ICD-10-CM | POA: Diagnosis not present

## 2017-07-25 DIAGNOSIS — Z823 Family history of stroke: Secondary | ICD-10-CM | POA: Insufficient documentation

## 2017-07-25 DIAGNOSIS — F333 Major depressive disorder, recurrent, severe with psychotic symptoms: Secondary | ICD-10-CM

## 2017-07-25 DIAGNOSIS — I110 Hypertensive heart disease with heart failure: Secondary | ICD-10-CM | POA: Insufficient documentation

## 2017-07-25 DIAGNOSIS — F332 Major depressive disorder, 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 features: Secondary | ICD-10-CM | POA: Insufficient documentation

## 2017-07-25 DIAGNOSIS — K219 Gastro-esophageal reflux disease without esophagitis: Secondary | ICD-10-CM | POA: Insufficient documentation

## 2017-07-25 DIAGNOSIS — M4800 Spinal stenosis, site unspecified: Secondary | ICD-10-CM | POA: Insufficient documentation

## 2017-07-25 DIAGNOSIS — M545 Low back pain: Secondary | ICD-10-CM | POA: Insufficient documentation

## 2017-07-25 DIAGNOSIS — Z8249 Family history of ischemic heart disease and other diseases of the circulatory system: Secondary | ICD-10-CM | POA: Diagnosis not present

## 2017-07-25 DIAGNOSIS — I509 Heart failure, unspecified: Secondary | ICD-10-CM | POA: Insufficient documentation

## 2017-07-25 DIAGNOSIS — G8929 Other chronic pain: Secondary | ICD-10-CM | POA: Diagnosis not present

## 2017-07-25 DIAGNOSIS — Z87891 Personal history of nicotine dependence: Secondary | ICD-10-CM | POA: Insufficient documentation

## 2017-07-25 MED ORDER — SODIUM CHLORIDE 0.9 % IV SOLN
INTRAVENOUS | Status: DC | PRN
  Administered 2017-07-25: 10:00:00 via INTRAVENOUS

## 2017-07-25 MED ORDER — LABETALOL HCL 5 MG/ML IV SOLN
INTRAVENOUS | Status: AC
  Filled 2017-07-25: qty 4

## 2017-07-25 MED ORDER — ONDANSETRON HCL 4 MG/2ML IJ SOLN: 4.0000 mg | Freq: Once | INTRAMUSCULAR | Status: DC | PRN

## 2017-07-25 MED ORDER — SUCCINYLCHOLINE CHLORIDE 200 MG/10ML IV SOSY
PREFILLED_SYRINGE | INTRAVENOUS | Status: DC | PRN
  Administered 2017-07-25: 80 mg via INTRAVENOUS

## 2017-07-25 MED ORDER — SUCCINYLCHOLINE CHLORIDE 20 MG/ML IJ SOLN
INTRAMUSCULAR | Status: AC
  Filled 2017-07-25: qty 1

## 2017-07-25 MED ORDER — SODIUM CHLORIDE 0.9 % IV SOLN
500.0000 mL | Freq: Once | INTRAVENOUS | Status: AC
  Administered 2017-07-25: 500 mL via INTRAVENOUS

## 2017-07-25 NOTE — Discharge Instructions (Signed)
1)  The drugs that you have been given will stay in your system until tomorrow so for the       next 24 hours you should not:  A. Drive an automobile  B. Make any legal decisions  C. Drink any alcoholic beverages  2)  You may resume your regular meals upon return home.  3)  A responsible adult must take you home.  Someone should stay with you for a few          hours, then be available by phone for the remainder of the treatment day.  4)  You May experience any of the following symptoms:  Headache, Nausea and a dry mouth (due to the medications you were given),  temporary memory loss and some confusion, or sore muscles (a warm bath  should help this).  If you you experience any of these symptoms let us know on                your return visit.  5)  Report any of the following: any acute discomfort, severe headache, or temperature        greater than 100.5 F.   Also report any unusual redness, swelling, drainage, or pain         at your IV site.    You may report Symptoms to:  ECT PROGRAM-  at [facility]          Phone: [phone], ECT Department           or Dr. Shary Key office [phone]  6)  Your next ECT Treatment is Day Monday  Date Jul 30, 2017  We will call 2 days prior to your scheduled appointment for arrival times.  7)  Nothing to eat or drink after midnight the night before your procedure.  8)  Take blood pressure med    With a sip of water the morning of your procedure.  9)  Other Instructions: Call [phone] to cancel the morning of your procedure due         to illness or emergency.  10) We will call within 72 hours to assess how you are feeling.

## 2017-07-25 NOTE — Anesthesia Procedure Notes (Signed)
Date/Time: 07/25/2017 10:34 AM Performed by: Lily Kocher, CRNA Pre-anesthesia Checklist: Patient identified, Emergency Drugs available, Suction available and Patient being monitored Patient Re-evaluated:Patient Re-evaluated prior to induction Oxygen Delivery Method: Circle system utilized Preoxygenation: Pre-oxygenation with 100% oxygen Induction Type: IV induction Ventilation: Mask ventilation without difficulty and Mask ventilation throughout procedure Airway Equipment and Method: Bite block Placement Confirmation: positive ETCO2 Dental Injury: Teeth and Oropharynx as per pre-operative assessment

## 2017-07-25 NOTE — Anesthesia Preprocedure Evaluation (Signed)
Anesthesia Evaluation  Patient identified by MRN, date of birth, ID band Patient awake    Reviewed: Allergy & Precautions, NPO status , Patient's Chart, lab work & pertinent test results, reviewed documented beta blocker date and time   Airway Mallampati: II  TM Distance: >3 FB     Dental  (+) Chipped   Pulmonary former smoker,           Cardiovascular hypertension, Pt. on medications +CHF       Neuro/Psych PSYCHIATRIC DISORDERS Depression Bipolar Disorder  Neuromuscular disease    GI/Hepatic GERD  Controlled,  Endo/Other    Renal/GU Renal disease     Musculoskeletal  (+) Arthritis , Osteoarthritis,    Abdominal   Peds  Hematology   Anesthesia Other Findings Past Medical History: 07/27/14: Bilateral carpal tunnel syndrome No date: Bipolar disorder [facility])     Comment:  geropsych admission to [facility] in Dec 2018 07/29/14: Chronic low back pain 07/27/14: Degenerative arthritis of lumbar spine No date: Depression 07/29/14: Esophageal spasm No date: GERD (gastroesophageal reflux disease) 07/29/14: Hyperlipemia No date: Hypertension 07/29/14: Spinal stenosis  Reproductive/Obstetrics                             Anesthesia Physical  Anesthesia Plan  ASA: III  Anesthesia Plan: General   Post-op Pain Management:    Induction: Intravenous  PONV Risk Score and Plan:   Airway Management Planned: Mask  Additional Equipment:   Intra-op Plan:   Post-operative Plan:   Informed Consent: I have reviewed the patients History and Physical, chart, labs and discussed the procedure including the risks, benefits and alternatives for the proposed anesthesia with the patient or authorized representative who has indicated his/her understanding and acceptance.     Plan Discussed with: CRNA  Anesthesia Plan Comments:         Anesthesia Quick Evaluation

## 2017-07-25 NOTE — Anesthesia Postprocedure Evaluation (Signed)
Anesthesia Post Note  Patient: Jenna Dudley  Procedure(s) Performed: ECT TX  Patient location during evaluation: PACU Anesthesia Type: General Level of consciousness: awake and alert Pain management: pain level controlled Vital Signs Assessment: post-procedure vital signs reviewed and stable Respiratory status: spontaneous breathing and respiratory function stable Cardiovascular status: stable Anesthetic complications: no     Last Vitals:  Vitals:   07/25/17 1107 07/25/17 1122  BP: 101/77 (!) 135/55  Pulse: 81 79  Resp: 17 16  Temp:    SpO2: 97%     Last Pain:  Vitals:   07/25/17 1107  TempSrc:   PainSc: 0-No pain                 Aubert Choyce K

## 2017-07-25 NOTE — Progress Notes (Signed)
Nurses encouraged patient to urinate for a urine sample several times during the visit but patient stated she could not urinate. MD notified.

## 2017-07-25 NOTE — Procedures (Signed)
ECT SERVICES Physician's Interval Evaluation & Treatment Note  Patient Identification: Jenna Dudley MRN:  [id number] Date of Evaluation:  07/25/2017 TX #: 5 MADRS:   MMSE:   P.E. Findings:  Patient is having a little more of a tremor in her hands also she is less energetic than previously.  Psychiatric Interval Note:  Mood is feeling down she does seem flat and not talking much but not expressing any psychotic symptoms right now  Subjective:  Patient is a 82 y.o. female seen for evaluation for Electroconvulsive Therapy. Patient is aware of still feeling a little depressed and anxious  Treatment Summary:     Right Unilateral              Bilateral   % Energy : 0.3 ms 100%   Impedance: 1530 ohms  Seizure Energy Index: 3256 V squared  Postictal Suppression Index: 70%  Seizure Concordance Index: 86%  Medications  Pre Shock: Toradol 30 mg Brevital 60 mg labetalol 20 mg succinylcholine 80 mg  Post Shock:    Seizure Duration: 24 seconds by EMG 60 seconds by EEG   Comments: Patient looks like she is getting a little worse we are going to see her back on Monday.  Treatment itself was without any complication no change to treatment plan  Lungs:    Clear to auscultation                Other:   Heart:      Regular rhythm              irregular rhythm      Previous H&P reviewed, patient examined and there are NO CHANGES                   Previous H&P reviewed, patient examined and there are changes noted.   Jenna Rasmussen, MD 5/1/201910:27 AM

## 2017-07-25 NOTE — Transfer of Care (Signed)
Immediate Anesthesia Transfer of Care Note  Patient: Jenna Dudley  Procedure(s) Performed: ECT TX  Patient Location: PACU  Anesthesia Type:General  Level of Consciousness: sedated  Airway & Oxygen Therapy: Patient Spontanous Breathing and Patient connected to face mask oxygen  Post-op Assessment: Report given to RN and Post -op Vital signs reviewed and stable  Post vital signs: Reviewed and stable  Last Vitals:  Vitals Value Taken Time  BP 105/63 07/25/2017 10:45 AM  Temp 36.6 C 07/25/2017 10:45 AM  Pulse 72 07/25/2017 10:45 AM  Resp 25 07/25/2017 10:45 AM  SpO2 100 % 07/25/2017 10:45 AM  Vitals shown include unvalidated device data.  Last Pain:  Vitals:   07/25/17 0839  TempSrc:   PainSc: 0-No pain         Complications: No apparent anesthesia complications

## 2017-07-25 NOTE — Anesthesia Post-op Follow-up Note (Signed)
Anesthesia QCDR form completed.        

## 2017-07-25 NOTE — H&P (Signed)
Jenna Dudley is an 82 y.o. female.   Chief Complaint: Patient is feeling a little down and actually describes herself as being depressed and anxious.  Her husband notes that she has been staying a little down even after the last treatment.  Also they are noticing still having some weight loss and some tremor in her hands which is more noticeable. HPI: History of recurrent severe psychotic depression with previous good maintenance treatment with ECT  Past Medical History:  Diagnosis Date  . Bilateral carpal tunnel syndrome 07/27/14  . Bipolar disorder [facility])    geropsych admission to [facility] in Dec 2018  . Chronic diastolic CHF (congestive heart failure) (HCC)    a. 03/2017 Echo: EF 55-60%, no rwma, Gr1 DD, mild MR.  . Chronic low back pain 07/29/14  . Degenerative arthritis of lumbar spine 07/27/14  . Dementia   . Depression   . Esophageal spasm 07/29/14  . GERD (gastroesophageal reflux disease)   . Hyperlipemia 07/29/14  . Hypertension   . Spinal stenosis 07/29/14    Past Surgical History:  Procedure Laterality Date  . HIP SURGERY Right   . INNER EAR SURGERY Right   . REPLACEMENT TOTAL KNEE BILATERAL Bilateral 07/27/14    Family History  Problem Relation Age of Onset  . Hypertension Mother   . Stroke Mother    Social History:  reports that she quit smoking about 42 years ago. Her smoking use included cigarettes. She has never used smokeless tobacco. She reports that she does not drink alcohol or use drugs.  Allergies:  Allergies  Allergen Reactions  . Aspirin Other (See Comments)    Unknown reaction  . Sulfa Antibiotics Other (See Comments)    Unknown reaction     (Not in a hospital admission)  No results found for this or any previous visit (from the past 48 hour(s)). No results found.  Review of Systems  Constitutional: Positive for weight loss.  HENT: Negative.   Eyes: Negative.   Respiratory: Negative.   Cardiovascular: Negative.   Gastrointestinal: Negative.    Musculoskeletal: Negative.   Skin: Negative.   Neurological: Positive for tremors.  Psychiatric/Behavioral: Positive for depression. Negative for hallucinations, memory loss, substance abuse and suicidal ideas. The patient is nervous/anxious. The patient does not have insomnia.     Blood pressure (!) 133/55, pulse 79, temperature 97.8 F (36.6 C), temperature source Oral, height 5' (1.524 m), weight 60.3 kg (133 lb), SpO2 100 %. Physical Exam  Nursing note and vitals reviewed. Constitutional: She appears well-developed and well-nourished.  HENT:  Head: Normocephalic and atraumatic.  Eyes: Pupils are equal, round, and reactive to light. Conjunctivae are normal.  Neck: Normal range of motion.  Cardiovascular: Regular rhythm and normal heart sounds.  Respiratory: Effort normal. No respiratory distress.  GI: Soft.  Musculoskeletal: Normal range of motion.  Neurological: She is alert. She displays tremor.  Skin: Skin is warm and dry.  Psychiatric: Judgment normal. Her affect is blunt. Her speech is delayed. She is slowed. Thought content is not paranoid. Cognition and memory are impaired. She expresses no homicidal and no suicidal ideation.     Assessment/Plan We are going to do treatment today and then see her back in oh about 5 days.  I see that is a reasonable compromise did not cause excessive cognitive effects but may be see if we can catch a depression before things get worse.  Patient and husband agree.  Mordecai Rasmussen, MD 07/25/2017, 10:25 AM

## 2017-07-31 ENCOUNTER — Other Ambulatory Visit: Payer: Self-pay | Admitting: Psychiatry

## 2017-08-01 ENCOUNTER — Encounter: Payer: Self-pay | Admitting: Anesthesiology

## 2017-08-01 ENCOUNTER — Encounter (HOSPITAL_BASED_OUTPATIENT_CLINIC_OR_DEPARTMENT_OTHER)
Admission: RE | Admit: 2017-08-01 | Discharge: 2017-08-01 | Disposition: A | Discharge status: Home / Self Care | Payer: Medicare Other | Source: Ambulatory Visit | Attending: Psychiatry | Admitting: Psychiatry

## 2017-08-01 DIAGNOSIS — F332 Major depressive disorder, 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 features: Secondary | ICD-10-CM | POA: Diagnosis not present

## 2017-08-01 DIAGNOSIS — F333 Major depressive disorder, recurrent, severe with psychotic symptoms: Secondary | ICD-10-CM | POA: Diagnosis not present

## 2017-08-01 MED ORDER — LABETALOL HCL 5 MG/ML IV SOLN
INTRAVENOUS | Status: DC | PRN
  Administered 2017-08-01: 20 mg via INTRAVENOUS

## 2017-08-01 MED ORDER — KETOROLAC TROMETHAMINE 30 MG/ML IJ SOLN
15.0000 mg | Freq: Once | INTRAMUSCULAR | Status: AC
  Administered 2017-08-01: 15 mg via INTRAVENOUS

## 2017-08-01 MED ORDER — METHOHEXITAL SODIUM 0.5 G IJ SOLR
INTRAMUSCULAR | Status: AC
  Filled 2017-08-01: qty 500

## 2017-08-01 MED ORDER — METHOHEXITAL SODIUM 100 MG/10ML IV SOSY
PREFILLED_SYRINGE | INTRAVENOUS | Status: DC | PRN
  Administered 2017-08-01: 60 mg via INTRAVENOUS

## 2017-08-01 MED ORDER — SUCCINYLCHOLINE CHLORIDE 200 MG/10ML IV SOSY
PREFILLED_SYRINGE | INTRAVENOUS | Status: DC | PRN
  Administered 2017-08-01: 80 mg via INTRAVENOUS

## 2017-08-01 MED ORDER — SUCCINYLCHOLINE CHLORIDE 20 MG/ML IJ SOLN
INTRAMUSCULAR | Status: AC
  Filled 2017-08-01: qty 1

## 2017-08-01 MED ORDER — LABETALOL HCL 5 MG/ML IV SOLN
INTRAVENOUS | Status: AC
  Filled 2017-08-01: qty 4

## 2017-08-01 MED ORDER — SODIUM CHLORIDE 0.9 % IV SOLN
500.0000 mL | Freq: Once | INTRAVENOUS | Status: AC
  Administered 2017-08-01: 500 mL via INTRAVENOUS

## 2017-08-01 MED ORDER — KETOROLAC TROMETHAMINE 30 MG/ML IJ SOLN
INTRAMUSCULAR | Status: AC
  Filled 2017-08-01: qty 1

## 2017-08-01 NOTE — Transfer of Care (Addendum)
Immediate Anesthesia Transfer of Care Note  Patient: Jenna Dudley  Procedure(s) Performed: ECT TX  Patient Location: PACU  Anesthesia Type:General  Level of Consciousness: sedated  Airway & Oxygen Therapy: Patient Spontanous Breathing and Patient connected to face mask oxygen  Post-op Assessment: Report given to RN and Post -op Vital signs reviewed and stable  Post vital signs: Reviewed and stable  Last Vitals:  Vitals Value Taken Time  BP    Temp    Pulse    Resp    SpO2      Last Pain:  Vitals:   08/01/17 1030  TempSrc:   PainSc: Asleep         Complications: No apparent anesthesia complications

## 2017-08-01 NOTE — Anesthesia Procedure Notes (Signed)
Date/Time: 08/01/2017 10:20 AM Performed by: Lily Kocher, CRNA Pre-anesthesia Checklist: Patient identified, Emergency Drugs available, Suction available and Patient being monitored Patient Re-evaluated:Patient Re-evaluated prior to induction Oxygen Delivery Method: Circle system utilized Preoxygenation: Pre-oxygenation with 100% oxygen Induction Type: IV induction Ventilation: Mask ventilation without difficulty and Mask ventilation throughout procedure Airway Equipment and Method: Bite block Placement Confirmation: positive ETCO2 Dental Injury: Teeth and Oropharynx as per pre-operative assessment

## 2017-08-01 NOTE — Discharge Instructions (Signed)
1)  The drugs that you have been given will stay in your system until tomorrow so for the       next 24 hours you should not:  A. Drive an automobile  B. Make any legal decisions  C. Drink any alcoholic beverages  2)  You may resume your regular meals upon return home.  3)  A responsible adult must take you home.  Someone should stay with you for a few          hours, then be available by phone for the remainder of the treatment day.  4)  You May experience any of the following symptoms:  Headache, Nausea and a dry mouth (due to the medications you were given),  temporary memory loss and some confusion, or sore muscles (a warm bath  should help this).  If you you experience any of these symptoms let us know on                your return visit.  5)  Report any of the following: any acute discomfort, severe headache, or temperature        greater than 100.5 F.   Also report any unusual redness, swelling, drainage, or pain         at your IV site.    You may report Symptoms to:  ECT PROGRAM-  [facility]          Phone: [phone], ECT Department           or Dr. Shary Key office [phone]  6)  Your next ECT Treatment is  Friday May 24   We will call 2 days prior to your scheduled appointment for arrival times.  7)  Nothing to eat or drink after midnight the night before your procedure.  8)  Take     With a sip of water the morning of your procedure.  9)  Other Instructions: Call [phone] to cancel the morning of your procedure due         to illness or emergency.  10) We will call within 72 hours to assess how you are feeling.

## 2017-08-01 NOTE — H&P (Signed)
Jenna Dudley is an 82 y.o. female.   Chief Complaint: Patient is feeling much better less anxious and depressed. HPI: History of recurrent severe psychotic depression good response to ECT.  Past Medical History:  Diagnosis Date  . Bilateral carpal tunnel syndrome 07/27/14  . Bipolar disorder [facility])    geropsych admission to [facility] in Dec 2018  . Chronic diastolic CHF (congestive heart failure) (HCC)    a. 03/2017 Echo: EF 55-60%, no rwma, Gr1 DD, mild MR.  . Chronic low back pain 07/29/14  . Degenerative arthritis of lumbar spine 07/27/14  . Dementia   . Depression   . Esophageal spasm 07/29/14  . GERD (gastroesophageal reflux disease)   . Hyperlipemia 07/29/14  . Hypertension   . Spinal stenosis 07/29/14    Past Surgical History:  Procedure Laterality Date  . HIP SURGERY Right   . INNER EAR SURGERY Right   . REPLACEMENT TOTAL KNEE BILATERAL Bilateral 07/27/14    Family History  Problem Relation Age of Onset  . Hypertension Mother   . Stroke Mother    Social History:  reports that she quit smoking about 42 years ago. Her smoking use included cigarettes. She has never used smokeless tobacco. She reports that she does not drink alcohol or use drugs.  Allergies:  Allergies  Allergen Reactions  . Aspirin Other (See Comments)    Unknown reaction  . Sulfa Antibiotics Other (See Comments)    Unknown reaction     (Not in a hospital admission)  No results found for this or any previous visit (from the past 48 hour(s)). No results found.  Review of Systems  Constitutional: Negative.   HENT: Negative.   Eyes: Negative.   Respiratory: Negative.   Cardiovascular: Negative.   Gastrointestinal: Negative.   Musculoskeletal: Negative.   Skin: Negative.   Neurological: Positive for tremors.  Psychiatric/Behavioral: Positive for memory loss. Negative for depression, hallucinations, substance abuse and suicidal ideas. The patient is not nervous/anxious and does not have insomnia.      Blood pressure (!) 138/97, pulse 72, temperature 97.8 F (36.6 C), temperature source Oral, height 5' (1.524 m), weight 60.3 kg (133 lb), SpO2 98 %. Physical Exam   Assessment/Plan Patient is doing much better.  We will see her back on the 24th.  Also she mentions that her primary care doctor suggested loxapine may be causing some of her tremor.  This is a good point.  I suggest we stop the loxapine and switch back to olanzapine which had been very effective and well-tolerated in the past and patient and husband agree.  Mordecai Rasmussen, MD 08/01/2017, 10:10 AM

## 2017-08-01 NOTE — Anesthesia Postprocedure Evaluation (Signed)
Anesthesia Post Note  Patient: Jenna Dudley  Procedure(s) Performed: ECT TX  Patient location during evaluation: PACU Anesthesia Type: General Level of consciousness: awake and alert Pain management: pain level controlled Vital Signs Assessment: post-procedure vital signs reviewed and stable Respiratory status: spontaneous breathing, nonlabored ventilation, respiratory function stable and patient connected to nasal cannula oxygen Cardiovascular status: blood pressure returned to baseline and stable Postop Assessment: no apparent nausea or vomiting Anesthetic complications: no     Last Vitals:  Vitals:   08/01/17 1100 08/01/17 1107  BP: (!) 122/47 116/65  Pulse: 72 74  Resp: (!) 22 18  Temp: 36.9 C 36.8 C  SpO2: 97%     Last Pain:  Vitals:   08/01/17 1107  TempSrc: Oral  PainSc:                  Lenard Simmer

## 2017-08-01 NOTE — Procedures (Signed)
ECT SERVICES Physician's Interval Evaluation & Treatment Note  Patient Identification: Jenna Dudley MRN:  [id number] Date of Evaluation:  08/01/2017 TX #: 6  MADRS:   MMSE:   P.E. Findings:  No change to physical exam.  Still has something of a tremor.  Psychiatric Interval Note:  Mood much better less anxious less frightened  Subjective:  Patient is a 82 y.o. female seen for evaluation for Electroconvulsive Therapy. She is aware of feeling better  Treatment Summary:     Right Unilateral              Bilateral   % Energy : 0.3 ms 100%   Impedance: 1110 ohms  Seizure Energy Index: 2612 V squared  Postictal Suppression Index: 56%  Seizure Concordance Index: 77%  Medications  Pre Shock: Toradol 15 mg Brevital 60 mg labetalol 20 mg succinylcholine 70 mg  Post Shock:    Seizure Duration: 23 seconds by EMG 52 seconds by EEG   Comments: Follow-up in about 2 weeks  Lungs:    Clear to auscultation                Other:   Heart:      Regular rhythm              irregular rhythm      Previous H&P reviewed, patient examined and there are NO CHANGES                   Previous H&P reviewed, patient examined and there are changes noted.   Mordecai Rasmussen, MD 5/8/201910:12 AM

## 2017-08-01 NOTE — Anesthesia Preprocedure Evaluation (Signed)
Anesthesia Evaluation  Patient identified by MRN, date of birth, ID band Patient awake    Reviewed: Allergy & Precautions, NPO status , Patient's Chart, lab work & pertinent test results, reviewed documented beta blocker date and time   Airway Mallampati: II  TM Distance: >3 FB     Dental  (+) Chipped   Pulmonary former smoker,           Cardiovascular hypertension, Pt. on medications +CHF       Neuro/Psych PSYCHIATRIC DISORDERS Depression Bipolar Disorder  Neuromuscular disease    GI/Hepatic GERD  Controlled,  Endo/Other    Renal/GU Renal disease     Musculoskeletal  (+) Arthritis , Osteoarthritis,    Abdominal   Peds  Hematology   Anesthesia Other Findings Past Medical History: 07/27/14: Bilateral carpal tunnel syndrome No date: Bipolar disorder (HCC)     Comment:  geropsych admission to [place] in Dec 2018 07/29/14: Chronic low back pain 07/27/14: Degenerative arthritis of lumbar spine No date: Depression 07/29/14: Esophageal spasm No date: GERD (gastroesophageal reflux disease) 07/29/14: Hyperlipemia No date: Hypertension 07/29/14: Spinal stenosis  Reproductive/Obstetrics                             Anesthesia Physical  Anesthesia Plan  ASA: III  Anesthesia Plan: General   Post-op Pain Management:    Induction: Intravenous  PONV Risk Score and Plan:   Airway Management Planned: Mask  Additional Equipment:   Intra-op Plan:   Post-operative Plan:   Informed Consent: I have reviewed the patients History and Physical, chart, labs and discussed the procedure including the risks, benefits and alternatives for the proposed anesthesia with the patient or authorized representative who has indicated his/her understanding and acceptance.     Plan Discussed with: CRNA  Anesthesia Plan Comments:         Anesthesia Quick Evaluation  

## 2017-08-01 NOTE — Anesthesia Post-op Follow-up Note (Signed)
Anesthesia QCDR form completed.        

## 2017-08-02 ENCOUNTER — Ambulatory Visit (INDEPENDENT_AMBULATORY_CARE_PROVIDER_SITE_OTHER): Payer: Medicare Other | Admitting: Cardiovascular Disease

## 2017-08-02 ENCOUNTER — Encounter: Payer: Self-pay | Admitting: Cardiovascular Disease

## 2017-08-02 ENCOUNTER — Other Ambulatory Visit: Payer: Self-pay | Admitting: Psychiatry

## 2017-08-02 VITALS — BP 108/56 | HR 67 | Ht 60.0 in | Wt 133.2 lb

## 2017-08-02 DIAGNOSIS — I5032 Chronic diastolic (congestive) heart failure: Secondary | ICD-10-CM | POA: Diagnosis not present

## 2017-08-02 DIAGNOSIS — E785 Hyperlipidemia, unspecified: Secondary | ICD-10-CM

## 2017-08-02 DIAGNOSIS — I10 Essential (primary) hypertension: Secondary | ICD-10-CM

## 2017-08-02 MED ORDER — OLANZAPINE 10 MG PO TABS: 10.0000 mg | ORAL_TABLET | Freq: Every day | ORAL | 1 refills | Status: DC

## 2017-08-02 NOTE — Patient Instructions (Signed)
Medication Instructions: Continue same medications.   Labwork: None.   Procedures/Testing: None.   Follow-Up: 6 months with PA/NP  Any Additional Special Instructions Will Be Listed Below (If Applicable).     If you need a refill on your cardiac medications before your next appointment, please call your pharmacy.   

## 2017-08-02 NOTE — Progress Notes (Signed)
Cardiology Office Note   Date:  08/02/2017   ID:  Jenna Dudley, DOB 11/22/1935, MRN [id number]  PCP:  Aderoju, Marin Olp, MD  Cardiologist:   Lorine Bears, MD   Chief Complaint  Patient presents with  . Other    3 month follow up. Patient states chest pain and SOB has been improving. Meds reviewed verbally with patient.       History of Present Illness: Jenna Dudley is a 82 y.o. female who presents for a follow-up visit regarding chronic diastolic heart failure. She has multiple chronic medical conditions that include bipolar disorder requiring regular ECT treatments, hypertension, hyperlipidemia, GERD, chronic low back pain and early dementia. She was hospitalized in January of this year with heart failure that required diuresis.  Echocardiogram showed normal LV systolic function with grade 1 diastolic dysfunction.  Troponin was borderline elevated.  She improved with diuresis.  She underwent an outpatient nuclear stress test which was low risk. She has been doing well and reports improvement in shortness of breath and no recurrent chest pain.  No significant leg edema.  She takes her medications regularly.  Her husband monitors her weight daily and she follows low-sodium diet.    Past Medical History:  Diagnosis Date  . Bilateral carpal tunnel syndrome 07/27/14  . Bipolar disorder [facility])    geropsych admission to [facility] in Dec 2018  . Chronic diastolic CHF (congestive heart failure) (HCC)    a. 03/2017 Echo: EF 55-60%, no rwma, Gr1 DD, mild MR.  . Chronic low back pain 07/29/14  . Degenerative arthritis of lumbar spine 07/27/14  . Dementia   . Depression   . Esophageal spasm 07/29/14  . GERD (gastroesophageal reflux disease)   . Hyperlipemia 07/29/14  . Hypertension   . Spinal stenosis 07/29/14    Past Surgical History:  Procedure Laterality Date  . HIP SURGERY Right   . INNER EAR SURGERY Right   . REPLACEMENT TOTAL KNEE BILATERAL Bilateral 07/27/14     Current  Outpatient Medications  Medication Sig Dispense Refill  . acetaminophen-codeine (TYLENOL #3) 300-30 MG per tablet Take 1 tablet by mouth every 8 (eight) hours as needed for moderate pain or severe pain.    . cholecalciferol (VITAMIN D) 1000 units tablet Take 1,000 Units by mouth daily.    . ferrous sulfate 325 (65 FE) MG EC tablet Take 325 mg by mouth daily with breakfast.    . furosemide (LASIX) 20 MG tablet Take 1 tablet (20 mg total) by mouth daily. 30 tablet 3  . losartan (COZAAR) 25 MG tablet Take 1 tablet (25 mg total) by mouth daily. 30 tablet 0  . magnesium oxide (MAG-OX) 400 MG tablet Take 400 mg by mouth daily.    . mirtazapine (REMERON) 45 MG tablet Take 1 tablet (45 mg total) by mouth at bedtime. 30 tablet 6  . omeprazole (PRILOSEC) 20 MG capsule Take 20 mg by mouth 2 (two) times daily before a meal.     . simvastatin (ZOCOR) 40 MG tablet Take 40 mg by mouth at bedtime.     Marland Kitchen venlafaxine XR (EFFEXOR-XR) 150 MG 24 hr capsule TAKE 1 CAPSULE BY MOUTH DAILY WITH BREAKFAST 30 capsule 3  . metoprolol tartrate (LOPRESSOR) 50 MG tablet Take 1 tablet (50 mg total) by mouth 2 (two) times daily. 180 tablet 3   No current facility-administered medications for this visit.    Facility-Administered Medications Ordered in Other Visits  Medication Dose Route Frequency Provider Last  Rate Last Dose  . lactated ringers infusion   Intravenous Continuous Clapacs, John T, MD      . succinylcholine (ANECTINE) injection 80 mg  80 mg Intravenous Once Clapacs, John T, MD        Allergies:   Aspirin and Sulfa antibiotics    Social History:  The patient  reports that she quit smoking about 42 years ago. Her smoking use included cigarettes. She has never used smokeless tobacco. She reports that she does not drink alcohol or use drugs.   Family History:  The patient's family history includes Hypertension in her mother; Stroke in her mother.    ROS:  Please see the history of present illness.   Otherwise,  review of systems are positive for none.   All other systems are reviewed and negative.    PHYSICAL EXAM: VS:  BP (!) 108/56 (BP Location: Left Arm, Patient Position: Sitting, Cuff Size: Normal)   Pulse 67   Ht 5' (1.524 m)   Wt 133 lb 4 oz (60.4 kg)   LMP  (LMP Unknown)   BMI 26.02 kg/m  , BMI Body mass index is 26.02 kg/m. GEN: Well nourished, well developed, in no acute distress  HEENT: normal  Neck: no JVD, carotid bruits, or masses Cardiac: RRR; no murmurs, rubs, or gallops,no edema  Respiratory:  clear to auscultation bilaterally, normal work of breathing GI: soft, nontender, nondistended, + BS MS: no deformity or atrophy  Skin: warm and dry, no rash Neuro:  Strength and sensation are intact Psych: euthymic mood, full affect   EKG:  EKG is ordered today. The ekg ordered today demonstrates normal sinus rhythm with inferior T wave changes suggestive of ischemia.   Recent Labs: 04/20/2017: ALT 10; B Natriuretic Peptide 555.0 04/21/2017: Hemoglobin 10.6; Platelets 231; TSH 3.384 05/01/2017: BUN 15; Creatinine, Ser 0.92; Potassium 3.8; Sodium 136    Lipid Panel    Component Value Date/Time   CHOL 149 09/18/2014 0655   CHOL 144 03/30/2012 0512   TRIG 94 09/18/2014 0655   TRIG 103 03/30/2012 0512   HDL 33 (L) 09/18/2014 0655   HDL 33 (L) 03/30/2012 0512   CHOLHDL 4.5 09/18/2014 0655   VLDL 19 09/18/2014 0655   VLDL 21 03/30/2012 0512   LDLCALC 97 09/18/2014 0655   LDLCALC 90 03/30/2012 0512      Wt Readings from Last 3 Encounters:  08/02/17 133 lb 4 oz (60.4 kg)  06/28/17 136 lb (61.7 kg)  05/29/17 140 lb 8 oz (63.7 kg)       No flowsheet data found.    ASSESSMENT AND PLAN:  1.  Chronic diastolic heart failure: She appears to be euvolemic on small dose furosemide.  Blood pressure improved significantly with increasing the dose of metoprolol.  Ischemic cardiac evaluation was low risk.  Continue medical therapy.  2.  Essential hypertension: Blood pressure  is controlled.  3.  Hyperlipidemia: Currently on simvastatin 40 mg daily.   Disposition:   FU with APP in 6 months.   Signed,  Lorine Bears, MD  08/02/2017 10:05 AM    [facility]

## 2017-08-16 ENCOUNTER — Other Ambulatory Visit: Payer: Self-pay | Admitting: Psychiatry

## 2017-08-17 ENCOUNTER — Encounter: Payer: Self-pay | Admitting: Registered Nurse

## 2017-08-17 ENCOUNTER — Encounter (HOSPITAL_BASED_OUTPATIENT_CLINIC_OR_DEPARTMENT_OTHER)
Admission: RE | Admit: 2017-08-17 | Discharge: 2017-08-17 | Disposition: A | Discharge status: Home / Self Care | Payer: Medicare Other | Source: Ambulatory Visit | Attending: Psychiatry | Admitting: Psychiatry

## 2017-08-17 DIAGNOSIS — F332 Major depressive disorder, 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 features: Secondary | ICD-10-CM | POA: Diagnosis not present

## 2017-08-17 DIAGNOSIS — F333 Major depressive disorder, recurrent, severe with psychotic symptoms: Secondary | ICD-10-CM | POA: Diagnosis not present

## 2017-08-17 MED ORDER — SUCCINYLCHOLINE CHLORIDE 20 MG/ML IJ SOLN
INTRAMUSCULAR | Status: DC | PRN
  Administered 2017-08-17: 80 mg via INTRAVENOUS

## 2017-08-17 MED ORDER — SODIUM CHLORIDE 0.9 % IV SOLN
500.0000 mL | Freq: Once | INTRAVENOUS | Status: AC
  Administered 2017-08-17: 500 mL via INTRAVENOUS

## 2017-08-17 MED ORDER — METHOHEXITAL SODIUM 100 MG/10ML IV SOSY
PREFILLED_SYRINGE | INTRAVENOUS | Status: DC | PRN
  Administered 2017-08-17: 60 mg via INTRAVENOUS

## 2017-08-17 MED ORDER — LABETALOL HCL 5 MG/ML IV SOLN
INTRAVENOUS | Status: DC | PRN
  Administered 2017-08-17: 20 mg via INTRAVENOUS

## 2017-08-17 NOTE — H&P (Signed)
Jenna Dudley is an 82 y.o. female.   Chief Complaint: No chief complaint HPI: Continues to be stable back to her baseline with good mood no evidence of psychosis.  Tremor is improved as well.  Tolerating medicine well.  Past Medical History:  Diagnosis Date  . Bilateral carpal tunnel syndrome 07/27/14  . Bipolar disorder [facility])    geropsych admission to [facility] in Dec 2018  . Chronic diastolic CHF (congestive heart failure) (HCC)    a. 03/2017 Echo: EF 55-60%, no rwma, Gr1 DD, mild MR.  . Chronic low back pain 07/29/14  . Degenerative arthritis of lumbar spine 07/27/14  . Dementia   . Depression   . Esophageal spasm 07/29/14  . GERD (gastroesophageal reflux disease)   . Hyperlipemia 07/29/14  . Hypertension   . Spinal stenosis 07/29/14    Past Surgical History:  Procedure Laterality Date  . HIP SURGERY Right   . INNER EAR SURGERY Right   . REPLACEMENT TOTAL KNEE BILATERAL Bilateral 07/27/14    Family History  Problem Relation Age of Onset  . Hypertension Mother   . Stroke Mother    Social History:  reports that she quit smoking about 42 years ago. Her smoking use included cigarettes. She has never used smokeless tobacco. She reports that she does not drink alcohol or use drugs.  Allergies:  Allergies  Allergen Reactions  . Aspirin Other (See Comments)    Unknown reaction  . Sulfa Antibiotics Other (See Comments)    Unknown reaction     (Not in a hospital admission)  No results found for this or any previous visit (from the past 48 hour(s)). No results found.  Review of Systems  Constitutional: Negative.   HENT: Negative.   Eyes: Negative.   Respiratory: Negative.   Cardiovascular: Negative.   Gastrointestinal: Negative.   Musculoskeletal: Negative.   Skin: Negative.   Neurological: Negative.   Psychiatric/Behavioral: Negative.     Blood pressure (!) 137/54, pulse 73, temperature 98 F (36.7 C), resp. rate 18, height 5' (1.524 m), weight 62.6 kg (138 lb), SpO2 99  %. Physical Exam  Nursing note and vitals reviewed. Constitutional: She appears well-developed and well-nourished.  HENT:  Head: Normocephalic and atraumatic.  Eyes: Pupils are equal, round, and reactive to light. Conjunctivae are normal.  Neck: Normal range of motion.  Cardiovascular: Regular rhythm and normal heart sounds.  Respiratory: Effort normal. No respiratory distress.  GI: Soft.  Musculoskeletal: Normal range of motion.  Neurological: She is alert.  Skin: Skin is warm and dry.  Psychiatric: She has a normal mood and affect. Her behavior is normal. Judgment and thought content normal.     Assessment/Plan Treatment today and we will follow-up in 3 weeks.  Patient and husband agree.  Mordecai Rasmussen, MD 08/17/2017, 10:21 AM

## 2017-08-17 NOTE — Transfer of Care (Signed)
Immediate Anesthesia Transfer of Care Note  Patient: Jenna Dudley  Procedure(s) Performed: ECT TX  Patient Location: PACU  Anesthesia Type:General  Level of Consciousness: sedated  Airway & Oxygen Therapy: Patient Spontanous Breathing and Patient connected to face mask oxygen  Post-op Assessment: Report given to RN and Post -op Vital signs reviewed and stable  Post vital signs: Reviewed and stable  Last Vitals:  Vitals Value Taken Time  BP 136/57 08/17/2017 10:42 AM  Temp 36.4 C 08/17/2017 10:42 AM  Pulse 78 08/17/2017 10:42 AM  Resp 17 08/17/2017 10:42 AM  SpO2 100 % 08/17/2017 10:42 AM    Last Pain:  Vitals:   08/17/17 0834  TempSrc:   PainSc: 0-No pain         Complications: No apparent anesthesia complications

## 2017-08-17 NOTE — Anesthesia Procedure Notes (Signed)
Performed by: Dashiell Franchino, CRNA Pre-anesthesia Checklist: Patient identified, Emergency Drugs available, Suction available and Patient being monitored Patient Re-evaluated:Patient Re-evaluated prior to induction Oxygen Delivery Method: Circle system utilized Preoxygenation: Pre-oxygenation with 100% oxygen Induction Type: IV induction Ventilation: Mask ventilation without difficulty and Mask ventilation throughout procedure Airway Equipment and Method: Bite block Placement Confirmation: positive ETCO2 Dental Injury: Teeth and Oropharynx as per pre-operative assessment        

## 2017-08-17 NOTE — Discharge Instructions (Signed)
1)  The drugs that you have been given will stay in your system until tomorrow so for the       next 24 hours you should not:  A. Drive an automobile  B. Make any legal decisions  C. Drink any alcoholic beverages  2)  You may resume your regular meals upon return home.  3)  A responsible adult must take you home.  Someone should stay with you for a few          hours, then be available by phone for the remainder of the treatment day.  4)  You May experience any of the following symptoms:  Headache, Nausea and a dry mouth (due to the medications you were given),  temporary memory loss and some confusion, or sore muscles (a warm bath  should help this).  If you you experience any of these symptoms let us know on                your return visit.  5)  Report any of the following: any acute discomfort, severe headache, or temperature        greater than 100.5 F.   Also report any unusual redness, swelling, drainage, or pain         at your IV site.    You may report Symptoms to:  ECT PROGRAM- [place] [facility]          Phone: [phone], ECT Department           or Dr. Shary Key office [phone]  6)  Your next ECT Treatment is Day Friday  Date September 07, 2017  We will call 2 days prior to your scheduled appointment for arrival times.  7)  Nothing to eat or drink after midnight the night before your procedure.  8)  Take metoprolol    With a sip of water the morning of your procedure.  9)  Other Instructions: Call [phone] to cancel the morning of your procedure due         to illness or emergency.  10) We will call within 72 hours to assess how you are feeling.

## 2017-08-17 NOTE — Anesthesia Post-op Follow-up Note (Signed)
Anesthesia QCDR form completed.        

## 2017-08-17 NOTE — Anesthesia Postprocedure Evaluation (Signed)
Anesthesia Post Note  Patient: Jenna Dudley  Procedure(s) Performed: ECT TX  Patient location during evaluation: PACU Anesthesia Type: General Level of consciousness: awake and alert Pain management: pain level controlled Vital Signs Assessment: post-procedure vital signs reviewed and stable Respiratory status: spontaneous breathing and respiratory function stable Cardiovascular status: stable Anesthetic complications: no     Last Vitals:  Vitals:   08/17/17 1041 08/17/17 1042  BP: (!) 136/57 (!) 136/57  Pulse: 73 78  Resp: 17 17  Temp: (!) 36.4 C (!) 36.4 C  SpO2: 100% 100%    Last Pain:  Vitals:   08/17/17 0834  TempSrc:   PainSc: 0-No pain                 Donneisha Beane K

## 2017-08-17 NOTE — Procedures (Signed)
ECT SERVICES Physician's Interval Evaluation & Treatment Note  Patient Identification: Jenna Dudley MRN:  [id number] Date of Evaluation:  08/17/2017 TX #: 7  MADRS: 20  MMSE: 27  P.E. Findings:  No change to physical findings except that in fact her ear is looking better and the tremor is less than it was last time  Psychiatric Interval Note:  Mood is much better probably pretty much at baseline.  She is mildly anxious but pleasant  Subjective:  Patient is a 82 y.o. female seen for evaluation for Electroconvulsive Therapy. No complaint  Treatment Summary:     Right Unilateral              Bilateral   % Energy : 0.3 ms 100%   Impedance: 1090 ohms  Seizure Energy Index: 2606 V squared  Postictal Suppression Index: 83%  Seizure Concordance Index: 80%  Medications  Pre Shock: Total 15 mg Brevital 60 mg succinylcholine 70 mg  Post Shock:    Seizure Duration: 27 seconds by EMG 52 seconds by EEG   Comments: Follow-up in 3 weeks on 14 June  Lungs:    Clear to auscultation                Other:   Heart:      Regular rhythm              irregular rhythm      Previous H&P reviewed, patient examined and there are NO CHANGES                   Previous H&P reviewed, patient examined and there are changes noted.   Mordecai Rasmussen, MD 5/24/201910:23 AM

## 2017-08-17 NOTE — Anesthesia Preprocedure Evaluation (Signed)
Anesthesia Evaluation  Patient identified by MRN, date of birth, ID band Patient awake    Reviewed: Allergy & Precautions, NPO status , Patient's Chart, lab work & pertinent test results, reviewed documented beta blocker date and time   Airway Mallampati: II  TM Distance: >3 FB     Dental  (+) Chipped   Pulmonary former smoker,           Cardiovascular hypertension, Pt. on medications +CHF       Neuro/Psych PSYCHIATRIC DISORDERS Depression Bipolar Disorder  Neuromuscular disease    GI/Hepatic GERD  Controlled,  Endo/Other    Renal/GU Renal disease     Musculoskeletal  (+) Arthritis , Osteoarthritis,    Abdominal   Peds  Hematology   Anesthesia Other Findings Past Medical History: 07/27/14: Bilateral carpal tunnel syndrome No date: Bipolar disorder [facility])     Comment:  geropsych admission to [facility] in Dec 2018 07/29/14: Chronic low back pain 07/27/14: Degenerative arthritis of lumbar spine No date: Depression 07/29/14: Esophageal spasm No date: GERD (gastroesophageal reflux disease) 07/29/14: Hyperlipemia No date: Hypertension 07/29/14: Spinal stenosis  Reproductive/Obstetrics                             Anesthesia Physical  Anesthesia Plan  ASA: III  Anesthesia Plan: General   Post-op Pain Management:    Induction: Intravenous  PONV Risk Score and Plan:   Airway Management Planned: Mask  Additional Equipment:   Intra-op Plan:   Post-operative Plan:   Informed Consent: I have reviewed the patients History and Physical, chart, labs and discussed the procedure including the risks, benefits and alternatives for the proposed anesthesia with the patient or authorized representative who has indicated his/her understanding and acceptance.     Plan Discussed with: CRNA  Anesthesia Plan Comments:         Anesthesia Quick Evaluation

## 2017-08-31 ENCOUNTER — Other Ambulatory Visit: Payer: Self-pay

## 2017-08-31 ENCOUNTER — Telehealth: Payer: Self-pay | Admitting: Cardiovascular Disease

## 2017-08-31 MED ORDER — FUROSEMIDE 20 MG PO TABS: 20.0000 mg | ORAL_TABLET | Freq: Every day | ORAL | 0 refills | Status: DC

## 2017-08-31 NOTE — Telephone Encounter (Signed)
Requested Prescriptions   Signed Prescriptions Disp Refills  . furosemide (LASIX) 20 MG tablet 30 tablet 0    Sig: Take 1 tablet (20 mg total) by mouth daily.    Authorizing Provider: Lorine BearsARIDA, MUHAMMAD A    Ordering User: Margrett RudSLAYTON, Dametra Whetsel N

## 2017-08-31 NOTE — Telephone Encounter (Signed)
°*  STAT* If patient is at the pharmacy, call can be transferred to refill team.   1. Which medications need to be refilled? (please list name of each medication and dose if known) furosemide (LA SIX) 20 MG 1 tablet daily  2. Which pharmacy/location (including street and city if local pharmacy) is medication to be sent to? [facility] in [place]  3. Do they need a 30 day or 90 day supply? 30 day   Patient is currently out and has not been taking If it cannot be refilled, please call

## 2017-09-07 ENCOUNTER — Telehealth: Payer: Self-pay | Admitting: *Deleted

## 2017-09-09 ENCOUNTER — Other Ambulatory Visit: Payer: Self-pay | Admitting: Psychiatry

## 2017-09-10 ENCOUNTER — Encounter: Payer: Self-pay | Admitting: Anesthesiology

## 2017-09-10 ENCOUNTER — Encounter
Admission: RE | Admit: 2017-09-10 | Discharge: 2017-09-10 | Disposition: A | Discharge status: Home / Self Care | Payer: Medicare Other | Source: Ambulatory Visit | Attending: Psychiatry | Admitting: Psychiatry

## 2017-09-10 DIAGNOSIS — F332 Major depressive disorder, 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 features: Secondary | ICD-10-CM | POA: Diagnosis not present

## 2017-09-10 DIAGNOSIS — F333 Major depressive disorder, recurrent, severe with psychotic symptoms: Secondary | ICD-10-CM | POA: Diagnosis present

## 2017-09-10 DIAGNOSIS — I5032 Chronic diastolic (congestive) heart failure: Secondary | ICD-10-CM | POA: Diagnosis not present

## 2017-09-10 DIAGNOSIS — I110 Hypertensive heart disease with heart failure: Secondary | ICD-10-CM | POA: Insufficient documentation

## 2017-09-10 DIAGNOSIS — E785 Hyperlipidemia, unspecified: Secondary | ICD-10-CM | POA: Insufficient documentation

## 2017-09-10 DIAGNOSIS — K219 Gastro-esophageal reflux disease without esophagitis: Secondary | ICD-10-CM | POA: Diagnosis not present

## 2017-09-10 MED ORDER — LABETALOL HCL 5 MG/ML IV SOLN
INTRAVENOUS | Status: DC | PRN
  Administered 2017-09-10: 20 mg via INTRAVENOUS

## 2017-09-10 MED ORDER — METHOHEXITAL SODIUM 100 MG/10ML IV SOSY
PREFILLED_SYRINGE | INTRAVENOUS | Status: DC | PRN
  Administered 2017-09-10: 60 mg via INTRAVENOUS

## 2017-09-10 MED ORDER — KETOROLAC TROMETHAMINE 30 MG/ML IJ SOLN: 15.0000 mg | Freq: Once | INTRAMUSCULAR | Status: DC

## 2017-09-10 MED ORDER — ONDANSETRON HCL 4 MG/2ML IJ SOLN: 4.0000 mg | Freq: Once | INTRAMUSCULAR | Status: DC | PRN

## 2017-09-10 MED ORDER — SODIUM CHLORIDE 0.9 % IV SOLN
500.0000 mL | Freq: Once | INTRAVENOUS | Status: AC
  Administered 2017-09-10: 500 mL via INTRAVENOUS

## 2017-09-10 MED ORDER — SUCCINYLCHOLINE CHLORIDE 200 MG/10ML IV SOSY
PREFILLED_SYRINGE | INTRAVENOUS | Status: DC | PRN
  Administered 2017-09-10: 80 mg via INTRAVENOUS

## 2017-09-10 MED ORDER — LABETALOL HCL 5 MG/ML IV SOLN
INTRAVENOUS | Status: AC
  Filled 2017-09-10: qty 4

## 2017-09-10 NOTE — Procedures (Signed)
ECT SERVICES Physician's Interval Evaluation & Treatment Note  Patient Identification: Jenna Dudley MRN:  [id number] Date of Evaluation:  09/10/2017 TX #: 8  MADRS:   MMSE:   P.E. Findings:  Physical exam unchanged.  Vitals normal.  No new neurologic findings.  Psychiatric Interval Note:  Mood stable no sign of psychosis or agitation no specific complaint  Subjective:  Patient is a 82 y.o. female seen for evaluation for Electroconvulsive Therapy. No specific complaint  Treatment Summary:   [x]   Right Unilateral             []  Bilateral   % Energy : 0.3 ms 100%   Impedance: 1480 ohms  Seizure Energy Index: 5803 V squared  Postictal Suppression Index: 91%  Seizure Concordance Index: 86%  Medications  Pre Shock: Brevital 60 mg succinylcholine 70 mg  Post Shock:    Seizure Duration: 23 seconds EMG 59 seconds EEG   Comments: Doing well.  Follow-up 4 weeks  Lungs:  [x]   Clear to auscultation               []  Other:   Heart:    [x]   Regular rhythm             []  irregular rhythm    [x]   Previous H&P reviewed, patient examined and there are NO CHANGES                 []   Previous H&P reviewed, patient examined and there are changes noted.   Mordecai RasmussenJohn Clapacs, MD 6/17/201911:36 AM

## 2017-09-10 NOTE — Transfer of Care (Signed)
Immediate Anesthesia Transfer of Care Note  Patient: Jenna Dudley  Procedure(s) Performed: ECT TX  Patient Location: PACU  Anesthesia Type:General  Level of Consciousness: sedated  Airway & Oxygen Therapy: Patient Spontanous Breathing and Patient connected to face mask oxygen  Post-op Assessment: Report given to RN and Post -op Vital signs reviewed and stable  Post vital signs: Reviewed and stable  Last Vitals:  Vitals Value Taken Time  BP    Temp    Pulse 75 09/10/2017 12:02 PM  Resp 26 09/10/2017 12:02 PM  SpO2 100 % 09/10/2017 12:02 PM  Vitals shown include unvalidated device data.  Last Pain:  Vitals:   09/10/17 0918  TempSrc:   PainSc: 0-No pain         Complications: No apparent anesthesia complications

## 2017-09-10 NOTE — Anesthesia Preprocedure Evaluation (Signed)
Anesthesia Evaluation  Patient identified by MRN, date of birth, ID band Patient awake    Reviewed: Allergy & Precautions, H&P , NPO status , reviewed documented beta blocker date and time   Airway Mallampati: II  TM Distance: >3 FB Neck ROM: full    Dental  (+) Missing, Chipped   Pulmonary former smoker,    Pulmonary exam normal        Cardiovascular hypertension, +CHF  Normal cardiovascular exam     Neuro/Psych PSYCHIATRIC DISORDERS Depression Bipolar Disorder Dementia  Neuromuscular disease    GI/Hepatic GERD  Controlled,  Endo/Other    Renal/GU      Musculoskeletal  (+) Arthritis ,   Abdominal   Peds  Hematology   Anesthesia Other Findings Past Medical History: 07/27/14: Bilateral carpal tunnel syndrome No date: Bipolar disorder (HCC)     Comment:  geropsych admission [facility] Dec 2018 No date: Chronic diastolic CHF (congestive heart failure) (HCC)     Comment:  a. 03/2017 Echo: EF 55-60%, no rwma, Gr1 DD, mild MR. 07/29/14: Chronic low back pain 07/27/14: Degenerative arthritis of lumbar spine No date: Dementia No date: Depression 07/29/14: Esophageal spasm No date: GERD (gastroesophageal reflux disease) 07/29/14: Hyperlipemia No date: Hypertension 07/29/14: Spinal stenosis  Past Surgical History: No date: HIP SURGERY; Right No date: INNER EAR SURGERY; Right 07/27/14: REPLACEMENT TOTAL KNEE BILATERAL; Bilateral  BMI    Body Mass Index:  27.15 kg/m      Reproductive/Obstetrics                             Anesthesia Physical Anesthesia Plan  ASA: III  Anesthesia Plan: General   Post-op Pain Management:    Induction:   PONV Risk Score and Plan: 3 and Treatment may vary due to age or medical condition and TIVA  Airway Management Planned:   Additional Equipment:   Intra-op Plan:   Post-operative Plan:   Informed Consent: I have reviewed the patients History and  Physical, chart, labs and discussed the procedure including the risks, benefits and alternatives for the proposed anesthesia with the patient or authorized representative who has indicated his/her understanding and acceptance.   Dental Advisory Given  Plan Discussed with: CRNA  Anesthesia Plan Comments:         Anesthesia Quick Evaluation

## 2017-09-10 NOTE — Anesthesia Post-op Follow-up Note (Signed)
Anesthesia QCDR form completed.        

## 2017-09-10 NOTE — H&P (Signed)
Jenna Dudley is an 82 y.o. female.   Chief Complaint: Patient has no specific complaint HPI: History of recurrent psychotic depression.  Does well with maintenance ECT.  Since our last treatment and has been doing well with no return of severe symptoms  Past Medical History:  Diagnosis Date  . Bilateral carpal tunnel syndrome 07/27/14  . Bipolar disorder [facility](HCC)    ger[facility]ych admission [facility] in Dec 2018  . Chronic diastolic CHF (congestive heart failure) (HCC)    a. 03/2017 Echo: EF 55-60%, no rwma, Gr1 DD, mild MR.  . Chronic low back pain 07/29/14  . Degenerative arthritis of lumbar spine 07/27/14  . Dementia   . Depression   . Esophageal spasm 07/29/14  . GERD (gastroesophageal reflux disease)   . Hyperlipemia 07/29/14  . Hypertension   . Spinal stenosis 07/29/14    Past Surgical History:  Procedure Laterality Date  . HIP SURGERY Right   . INNER EAR SURGERY Right   . REPLACEMENT TOTAL KNEE BILATERAL Bilateral 07/27/14    Family History  Problem Relation Age of Onset  . Hypertension Mother   . Stroke Mother    Social History:  reports that she quit smoking about 42 years ago. Her smoking use included cigarettes. She has never used smokeless tobacco. She reports that she does not drink alcohol or use drugs.  Allergies:  Allergies  Allergen Reactions  . Aspirin Other (See Comments)    Unknown reaction  . Sulfa Antibiotics Other (See Comments)    Unknown reaction     (Not in a hospital admission)  No results found for this or any previous visit (from the past 48 hour(s)). No results found.  Review of Systems  Constitutional: Negative.   HENT: Negative.   Eyes: Negative.   Respiratory: Negative.   Cardiovascular: Negative.   Gastrointestinal: Negative.   Musculoskeletal: Negative.   Skin: Negative.   Neurological: Negative.   Psychiatric/Behavioral: Negative.     Blood pressure (!) 159/88, pulse (!) 106, temperature 97.8 F (36.6 C), temperature source Oral,  resp. rate 18, height 5' (1.524 m), weight 63 kg (139 lb). Physical Exam  Nursing note and vitals reviewed. Constitutional: She appears well-developed and well-nourished.  HENT:  Head: Normocephalic and atraumatic.  Eyes: Pupils are equal, round, and reactive to light. Conjunctivae are normal.  Neck: Normal range of motion.  Cardiovascular: Regular rhythm and normal heart sounds.  Respiratory: Effort normal. No respiratory distress.  GI: Soft.  Musculoskeletal: Normal range of motion.  Neurological: She is alert.  Skin: Skin is warm and dry.  Psychiatric: She has a normal mood and affect. Her speech is normal and behavior is normal. Judgment and thought content normal.     Assessment/Plan Treatment today and follow-up in 4 weeks  Mordecai RasmussenJohn Anett Ranker, MD 09/10/2017, 11:34 AM

## 2017-09-10 NOTE — Discharge Instructions (Signed)
1)  The drugs that you have been given will stay in your system until tomorrow so for the       next 24 hours you should not:  A. Drive an automobile  B. Make any legal decisions  C. Drink any alcoholic beverages  2)  You may resume your regular meals upon return home.  3)  A responsible adult must take you home.  Someone should stay with you for a few          hours, then be available by phone for the remainder of the treatment day.  4)  You May experience any of the following symptoms:  Headache, Nausea and a dry mouth (due to the medications you were given),  temporary memory loss and some confusion, or sore muscles (a warm bath  should help this).  If you you experience any of these symptoms let us know on                your return visit.  5)  Report any of the following: any acute discomfort, severe headache, or temperature        greater than 100.5 F.   Also report any unusual redness, swelling, drainage, or pain         at your IV site.    You may report Symptoms to:  ECT PROGRAM- [place] [facility]          Phone: [phone], ECT Department           or Dr. Shary Keylapac's office [phone]  6)  Your next ECT Treatment is Day Monday Date October 08, 2017   We will call 2 days prior to your scheduled appointment for arrival times.  7)  Nothing to eat or drink after midnight the night before your procedure.  8)  Take .    With a sip of water the morning of your procedure.  9)  Other Instructions: Call [phone] to cancel the morning of your procedure due         to illness or emergency.  10) We will call within 72 hours to assess how you are feeling.

## 2017-09-10 NOTE — Anesthesia Procedure Notes (Signed)
Date/Time: 09/10/2017 11:52 AM Performed by: Lily KocherPeralta, Glade Strausser, CRNA Pre-anesthesia Checklist: Patient identified, Emergency Drugs available, Suction available and Patient being monitored Patient Re-evaluated:Patient Re-evaluated prior to induction Oxygen Delivery Method: Circle system utilized Preoxygenation: Pre-oxygenation with 100% oxygen Induction Type: IV induction Ventilation: Mask ventilation without difficulty and Mask ventilation throughout procedure Airway Equipment and Method: Bite block Placement Confirmation: positive ETCO2 Dental Injury: Teeth and Oropharynx as per pre-operative assessment

## 2017-09-12 NOTE — Anesthesia Postprocedure Evaluation (Signed)
Anesthesia Post Note  Patient: Jenna DaftJoan M Embree  Procedure(s) Performed: ECT TX  Patient location during evaluation: PACU Anesthesia Type: General Level of consciousness: awake and alert Pain management: pain level controlled Vital Signs Assessment: post-procedure vital signs reviewed and stable Respiratory status: spontaneous breathing, nonlabored ventilation and respiratory function stable Cardiovascular status: blood pressure returned to baseline and stable Postop Assessment: no apparent nausea or vomiting Anesthetic complications: no     Last Vitals:  Vitals:   09/10/17 1232 09/10/17 1238  BP: (!) 125/51 (!) 110/93  Pulse: 77 79  Resp: 20 18  Temp: 36.7 C   SpO2: 95%     Last Pain:  Vitals:   09/10/17 1238  TempSrc:   PainSc: 0-No pain                 Christia ReadingScott T Virdie Penning

## 2017-09-28 ENCOUNTER — Other Ambulatory Visit: Payer: Self-pay | Admitting: Cardiovascular Disease

## 2017-10-01 ENCOUNTER — Other Ambulatory Visit: Payer: Self-pay | Admitting: Psychiatry

## 2017-10-03 ENCOUNTER — Other Ambulatory Visit: Payer: Self-pay | Admitting: Psychiatry

## 2017-10-03 ENCOUNTER — Telehealth: Payer: Self-pay | Admitting: *Deleted

## 2017-10-03 DIAGNOSIS — F3160 Bipolar disorder, current episode mixed, unspecified: Secondary | ICD-10-CM

## 2017-10-03 MED ORDER — MIRTAZAPINE 45 MG PO TABS: 45.0000 mg | ORAL_TABLET | Freq: Every day | ORAL | 6 refills | Status: DC

## 2017-10-03 MED ORDER — VENLAFAXINE HCL ER 150 MG PO CP24: ORAL_CAPSULE | ORAL | 6 refills | Status: DC

## 2017-10-03 MED ORDER — OLANZAPINE 10 MG PO TABS: 10.0000 mg | ORAL_TABLET | Freq: Every day | ORAL | 1 refills | Status: DC

## 2017-10-05 ENCOUNTER — Telehealth: Payer: Self-pay | Admitting: *Deleted

## 2017-10-10 ENCOUNTER — Encounter
Admission: RE | Admit: 2017-10-10 | Discharge: 2017-10-10 | Disposition: A | Discharge status: Home / Self Care | Payer: Medicare Other | Source: Ambulatory Visit | Attending: Psychiatry | Admitting: Psychiatry

## 2017-10-10 ENCOUNTER — Other Ambulatory Visit: Payer: Self-pay | Admitting: Psychiatry

## 2017-10-10 ENCOUNTER — Encounter: Payer: Self-pay | Admitting: Anesthesiology

## 2017-10-10 DIAGNOSIS — Z886 Allergy status to analgesic agent status: Secondary | ICD-10-CM | POA: Diagnosis not present

## 2017-10-10 DIAGNOSIS — Z882 Allergy status to sulfonamides status: Secondary | ICD-10-CM | POA: Diagnosis not present

## 2017-10-10 DIAGNOSIS — M47816 Spondylosis without myelopathy or radiculopathy, lumbar region: Secondary | ICD-10-CM | POA: Insufficient documentation

## 2017-10-10 DIAGNOSIS — Z8249 Family history of ischemic heart disease and other diseases of the circulatory system: Secondary | ICD-10-CM | POA: Diagnosis not present

## 2017-10-10 DIAGNOSIS — Z96653 Presence of artificial knee joint, bilateral: Secondary | ICD-10-CM | POA: Insufficient documentation

## 2017-10-10 DIAGNOSIS — I110 Hypertensive heart disease with heart failure: Secondary | ICD-10-CM | POA: Diagnosis not present

## 2017-10-10 DIAGNOSIS — E785 Hyperlipidemia, unspecified: Secondary | ICD-10-CM | POA: Diagnosis not present

## 2017-10-10 DIAGNOSIS — F0390 Unspecified dementia without behavioral disturbance: Secondary | ICD-10-CM | POA: Insufficient documentation

## 2017-10-10 DIAGNOSIS — K219 Gastro-esophageal reflux disease without esophagitis: Secondary | ICD-10-CM | POA: Diagnosis not present

## 2017-10-10 DIAGNOSIS — Z87891 Personal history of nicotine dependence: Secondary | ICD-10-CM | POA: Diagnosis not present

## 2017-10-10 DIAGNOSIS — Z823 Family history of stroke: Secondary | ICD-10-CM | POA: Insufficient documentation

## 2017-10-10 DIAGNOSIS — I5032 Chronic diastolic (congestive) heart failure: Secondary | ICD-10-CM | POA: Diagnosis not present

## 2017-10-10 DIAGNOSIS — F332 Major depressive disorder, 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 features: Secondary | ICD-10-CM | POA: Insufficient documentation

## 2017-10-10 MED ORDER — SUCCINYLCHOLINE CHLORIDE 20 MG/ML IJ SOLN
INTRAMUSCULAR | Status: AC
  Filled 2017-10-10: qty 1

## 2017-10-10 MED ORDER — SUCCINYLCHOLINE CHLORIDE 200 MG/10ML IV SOSY
PREFILLED_SYRINGE | INTRAVENOUS | Status: DC | PRN
  Administered 2017-10-10: 80 mg via INTRAVENOUS

## 2017-10-10 MED ORDER — SODIUM CHLORIDE 0.9 % IV SOLN
500.0000 mL | Freq: Once | INTRAVENOUS | Status: AC
  Administered 2017-10-10: 500 mL via INTRAVENOUS

## 2017-10-10 MED ORDER — METHOHEXITAL SODIUM 100 MG/10ML IV SOSY
PREFILLED_SYRINGE | INTRAVENOUS | Status: DC | PRN
  Administered 2017-10-10: 60 mg via INTRAVENOUS

## 2017-10-10 MED ORDER — METHOHEXITAL SODIUM 0.5 G IJ SOLR
INTRAMUSCULAR | Status: AC
  Filled 2017-10-10: qty 500

## 2017-10-10 MED ORDER — LABETALOL HCL 5 MG/ML IV SOLN
INTRAVENOUS | Status: DC | PRN
  Administered 2017-10-10: 20 mg via INTRAVENOUS

## 2017-10-10 MED ORDER — LABETALOL HCL 5 MG/ML IV SOLN
INTRAVENOUS | Status: AC
  Filled 2017-10-10: qty 4

## 2017-10-10 NOTE — Transfer of Care (Signed)
Immediate Anesthesia Transfer of Care Note  Patient: Jenna Dudley  Procedure(s) Performed: ECT TX  Patient Location: PACU  Anesthesia Type:General  Level of Consciousness: sedated  Airway & Oxygen Therapy: Patient Spontanous Breathing and Patient connected to face mask oxygen  Post-op Assessment: Report given to RN and Post -op Vital signs reviewed and stable  Post vital signs: Reviewed and stable  Last Vitals:  Vitals Value Taken Time  BP 137/60 10/10/2017 10:34 AM  Temp 37 C 10/10/2017 10:32 AM  Pulse 74 10/10/2017 10:35 AM  Resp 30 10/10/2017 10:35 AM  SpO2 100 % 10/10/2017 10:35 AM  Vitals shown include unvalidated device data.  Last Pain:  Vitals:   10/10/17 1032  TempSrc:   PainSc: 0-No pain         Complications: No apparent anesthesia complications

## 2017-10-10 NOTE — Anesthesia Preprocedure Evaluation (Signed)
Anesthesia Evaluation  Patient identified by MRN, date of birth, ID band Patient awake    Reviewed: Allergy & Precautions, H&P , NPO status , Patient's Chart, lab work & pertinent test results, reviewed documented beta blocker date and time   Airway Mallampati: II  TM Distance: >3 FB Neck ROM: full    Dental  (+) Missing, Chipped   Pulmonary neg pulmonary ROS, former smoker,    Pulmonary exam normal        Cardiovascular hypertension, +CHF  negative cardio ROS Normal cardiovascular exam  Normal EF, grade 1 diastolic dysfunction   Neuro/Psych PSYCHIATRIC DISORDERS Depression Bipolar Disorder Dementia  Neuromuscular disease negative neurological ROS  negative psych ROS   GI/Hepatic negative GI ROS, Neg liver ROS, GERD  Controlled,  Endo/Other  negative endocrine ROS  Renal/GU negative Renal ROS  negative genitourinary   Musculoskeletal negative musculoskeletal ROS (+) Arthritis ,   Abdominal   Peds negative pediatric ROS (+)  Hematology negative hematology ROS (+)   Anesthesia Other Findings Past Medical History: 07/27/14: Bilateral carpal tunnel syndrome No date: Bipolar disorder (HCC)     Comment:  [facility] No date: Chronic diastolic CHF (congestive heart failure) (HCC)     Comment:  a. 03/2017 Echo: EF 55-60%, no rwma, Gr1 DD, mild MR. 07/29/14: Chronic low back pain 07/27/14: Degenerative arthritis of lumbar spine No date: Dementia No date: Depression 07/29/14: Esophageal spasm No date: GERD (gastroesophageal reflux disease) 07/29/14: Hyperlipemia No date: Hypertension 07/29/14: Spinal stenosis  Past Surgical History: No date: HIP SURGERY; Right No date: INNER EAR SURGERY; Right 07/27/14: REPLACEMENT TOTAL KNEE BILATERAL; Bilateral  BMI    Body Mass Index:  27.15 kg/m      Reproductive/Obstetrics negative OB ROS                              Anesthesia Physical  Anesthesia Plan  ASA: III  Anesthesia Plan: General   Post-op Pain Management:    Induction:   PONV Risk Score and Plan: 3 and Treatment may vary due to age or medical condition and TIVA  Airway Management Planned: Mask  Additional Equipment:   Intra-op Plan:   Post-operative Plan:   Informed Consent: I have reviewed the patients History and Physical, chart, labs and discussed the procedure including the risks, benefits and alternatives for the proposed anesthesia with the patient or authorized representative who has indicated his/her understanding and acceptance.   Dental Advisory Given  Plan Discussed with: CRNA and Anesthesiologist  Anesthesia Plan Comments:         Anesthesia Quick Evaluation

## 2017-10-10 NOTE — Discharge Instructions (Signed)
1)  The drugs that you have been given will stay in your system until tomorrow so for the       next 24 hours you should not:  A. Drive an automobile  B. Make any legal decisions  C. Drink any alcoholic beverages  2)  You may resume your regular meals upon return home.  3)  A responsible adult must take you home.  Someone should stay with you for a few          hours, then be available by phone for the remainder of the treatment day.  4)  You May experience any of the following symptoms:  Headache, Nausea and a dry mouth (due to the medications you were given),  temporary memory loss and some confusion, or sore muscles (a warm bath  should help this).  If you you experience any of these symptoms let us know on                your return visit.  5)  Report any of the following: any acute discomfort, severe headache, or temperature        greater than 100.5 F.   Also report any unusual redness, swelling, drainage, or pain         at your IV site.    You may report Symptoms to:  ECT PROGRAM-  [facility]          Phone: [phone], ECT Department           or Dr. Shary Keylapac's office [phone]  6)  Your next ECT Treatment is Wednesday August 14  We will call 2 days prior to your scheduled appointment for arrival times.  7)  Nothing to eat or drink after midnight the night before your procedure.  8)  Take      With a sip of water the morning of your procedure.  9)  Other Instructions: Call [phone] to cancel the morning of your procedure due         to illness or emergency.  10) We will call within 72 hours to assess how you are feeling.

## 2017-10-10 NOTE — H&P (Signed)
Jenna Dudley is an 82 y.o. female.   Chief Complaint: no new complaint. Ear pain as usual. No depression  HPI: Chronic severe depression  Past Medical History:  Diagnosis Date  . Bilateral carpal tunnel syndrome 07/27/14  . Bipolar disorder [facility](HCC)    geropsych [facility] in [facility] 2018  . Chronic diastolic CHF (congestive heart failure) (HCC)    a. 03/2017 Echo: EF 55-60%, no rwma, Gr1 DD, mild MR.  . Chronic low back pain 07/29/14  . Degenerative arthritis of lumbar spine 07/27/14  . Dementia   . Depression   . Esophageal spasm 07/29/14  . GERD (gastroesophageal reflux disease)   . Hyperlipemia 07/29/14  . Hypertension   . Spinal stenosis 07/29/14    Past Surgical History:  Procedure Laterality Date  . HIP SURGERY Right   . INNER EAR SURGERY Right   . REPLACEMENT TOTAL KNEE BILATERAL Bilateral 07/27/14    Family History  Problem Relation Age of Onset  . Hypertension Mother   . Stroke Mother    Social History:  reports that she quit smoking about 42 years ago. Her smoking use included cigarettes. She has never used smokeless tobacco. She reports that she does not drink alcohol or use drugs.  Allergies:  Allergies  Allergen Reactions  . Aspirin Other (See Comments)    Unknown reaction  . Sulfa Antibiotics Other (See Comments)    Unknown reaction     (Not in a hospital admission)  No results found for this or any previous visit (from the past 48 hour(s)). No results found.  Review of Systems  Constitutional: Negative.   HENT: Negative.   Eyes: Negative.   Respiratory: Negative.   Cardiovascular: Negative.   Gastrointestinal: Negative.   Musculoskeletal: Negative.   Skin: Negative.   Neurological: Negative.   Psychiatric/Behavioral: Negative.     Blood pressure (!) 151/56, pulse 77, temperature 97.9 F (36.6 C), temperature source Oral, height 5' (1.524 m), weight 65.3 kg (144 lb), SpO2 100 %. Physical Exam  Nursing note and vitals  reviewed. Constitutional: She appears well-developed and well-nourished.  HENT:  Head: Normocephalic and atraumatic.  Eyes: Pupils are equal, round, and reactive to light. Conjunctivae are normal.  Neck: Normal range of motion.  Cardiovascular: Regular rhythm and normal heart sounds.  Respiratory: Effort normal. No respiratory distress.  GI: Soft.  Musculoskeletal: Normal range of motion.  Neurological: She is alert.  Skin: Skin is warm and dry.  Psychiatric: Judgment normal. Her affect is blunt. Her speech is delayed. She is slowed. Thought content is not paranoid. She expresses no homicidal and no suicidal ideation. She exhibits abnormal recent memory.     Assessment/Plan Treatmet effective as maintenance. Follow up about a month.  Mordecai RasmussenJohn Clapacs, MD 10/10/2017, 9:46 AM

## 2017-10-10 NOTE — Anesthesia Post-op Follow-up Note (Signed)
Anesthesia QCDR form completed.        

## 2017-10-10 NOTE — Anesthesia Procedure Notes (Signed)
Date/Time: 10/10/2017 10:22 AM Performed by: Lily KocherPeralta, Gaby Harney, CRNA Pre-anesthesia Checklist: Patient identified, Emergency Drugs available, Suction available and Patient being monitored Patient Re-evaluated:Patient Re-evaluated prior to induction Oxygen Delivery Method: Circle system utilized Preoxygenation: Pre-oxygenation with 100% oxygen Induction Type: IV induction Ventilation: Mask ventilation without difficulty and Mask ventilation throughout procedure Airway Equipment and Method: Bite block Placement Confirmation: positive ETCO2 Dental Injury: Teeth and Oropharynx as per pre-operative assessment

## 2017-10-10 NOTE — Anesthesia Postprocedure Evaluation (Signed)
Anesthesia Post Note  Patient: Jenna Dudley  Procedure(s) Performed: ECT TX  Patient location during evaluation: PACU Anesthesia Type: General Level of consciousness: awake and alert Pain management: pain level controlled Vital Signs Assessment: post-procedure vital signs reviewed and stable Respiratory status: spontaneous breathing, nonlabored ventilation and respiratory function stable Cardiovascular status: blood pressure returned to baseline and stable Postop Assessment: no apparent nausea or vomiting Anesthetic complications: no     Last Vitals:  Vitals:   10/10/17 1114 10/10/17 1119  BP: (!) 129/51   Pulse: 71   Resp: (!) 21   Temp:  36.7 C  SpO2: 96%     Last Pain:  Vitals:   10/10/17 1032  TempSrc:   PainSc: 0-No pain                 Jovita GammaKathryn L Fitzgerald

## 2017-10-10 NOTE — Procedures (Signed)
ECT SERVICES Physician's Interval Evaluation & Treatment Note  Patient Identification: Jenna DaftJoan M Dipinto MRN:  [id number] Date of Evaluation:  10/10/2017 TX #: 9  MADRS:   MMSE:   P.E. Findings:  No change to physical exam.  Ear remains a little sore.  Psychiatric Interval Note:  Mood is good upbeat no sign of her depression or typical psychotic features.  Subjective:  Patient is a 82 y.o. female seen for evaluation for Electroconvulsive Therapy. No complaint  Treatment Summary:   [x]   Right Unilateral             []  Bilateral   % Energy : 0.3 ms 100%   Impedance: 1090 ohms  Seizure Energy Index: 4346 V squared  Postictal Suppression Index: 78%  Seizure Concordance Index: 90%  Medications  Pre Shock: Brevital 60% succinylcholine 80%  Post Shock:    Seizure Duration: 24 seconds by EMG 60 seconds by EEG   Comments: Doing well with the schedule and we will see her back in 4 weeks  Lungs:  [x]   Clear to auscultation               []  Other:   Heart:    [x]   Regular rhythm             []  irregular rhythm    [x]   Previous H&P reviewed, patient examined and there are NO CHANGES                 []   Previous H&P reviewed, patient examined and there are changes noted.   Mordecai RasmussenJohn Cheryln Balcom, MD 7/17/201910:13 AM

## 2017-10-12 ENCOUNTER — Telehealth: Payer: Self-pay | Admitting: *Deleted

## 2017-10-29 ENCOUNTER — Telehealth: Payer: Self-pay | Admitting: *Deleted

## 2017-11-06 ENCOUNTER — Other Ambulatory Visit: Payer: Self-pay | Admitting: Psychiatry

## 2017-11-07 ENCOUNTER — Encounter
Admission: RE | Admit: 2017-11-07 | Discharge: 2017-11-07 | Disposition: A | Discharge status: Home / Self Care | Payer: Medicare Other | Source: Ambulatory Visit | Attending: Psychiatry | Admitting: Psychiatry

## 2017-11-07 ENCOUNTER — Encounter: Payer: Self-pay | Admitting: Anesthesiology

## 2017-11-07 DIAGNOSIS — F333 Major depressive disorder, recurrent, severe with psychotic symptoms: Secondary | ICD-10-CM | POA: Diagnosis not present

## 2017-11-07 DIAGNOSIS — F319 Bipolar disorder, unspecified: Secondary | ICD-10-CM | POA: Diagnosis present

## 2017-11-07 MED ORDER — LABETALOL HCL 5 MG/ML IV SOLN
INTRAVENOUS | Status: DC | PRN
  Administered 2017-11-07: 20 mg via INTRAVENOUS

## 2017-11-07 MED ORDER — SODIUM CHLORIDE 0.9 % IV SOLN
INTRAVENOUS | Status: DC | PRN
  Administered 2017-11-07: 11:00:00 via INTRAVENOUS

## 2017-11-07 MED ORDER — METHOHEXITAL SODIUM 100 MG/10ML IV SOSY
PREFILLED_SYRINGE | INTRAVENOUS | Status: DC | PRN
  Administered 2017-11-07: 60 mg via INTRAVENOUS

## 2017-11-07 MED ORDER — SODIUM CHLORIDE 0.9 % IV SOLN
500.0000 mL | Freq: Once | INTRAVENOUS | Status: AC
  Administered 2017-11-07: 500 mL via INTRAVENOUS

## 2017-11-07 NOTE — Transfer of Care (Signed)
Immediate Anesthesia Transfer of Care Note  Patient: Jenna Dudley  Procedure(s) Performed: ECT TX  Patient Location: PACU  Anesthesia Type:General  Level of Consciousness: sedated  Airway & Oxygen Therapy: Patient Spontanous Breathing and Patient connected to face mask oxygen  Post-op Assessment: Report given to RN and Post -op Vital signs reviewed and stable  Post vital signs: Reviewed and stable  Last Vitals:  Vitals Value Taken Time  BP    Temp    Pulse    Resp    SpO2      Last Pain:  Vitals:   11/07/17 0903  TempSrc: Oral  PainSc: 0-No pain         Complications: No apparent anesthesia complications

## 2017-11-07 NOTE — Procedures (Signed)
ECT SERVICES Physician's Interval Evaluation & Treatment Note  Patient Identification: Galen DaftJoan M Louis MRN:  [id number] Date of Evaluation:  11/07/2017 TX #: 10  MADRS: 18  MMSE: 20  P.E. Findings:  No change to physical exam  Psychiatric Interval Note:  Mood stable no complaints of depression no evidence of psychotic symptoms  Subjective:  Patient is a 82 y.o. female seen for evaluation for Electroconvulsive Therapy. Nothing other than the chronic back pain  Treatment Summary:   [x]   Right Unilateral             []  Bilateral   % Energy : 0.3 ms 100%   Impedance: 1810 ohms  Seizure Energy Index: 3556 V squared  Postictal Suppression Index: 86%  Seizure Concordance Index: 88%  Medications  Pre Shock: Brevital 60 mg succinylcholine 80 mg  Post Shock:    Seizure Duration: 26 seconds by EMG 55 seconds by EEG   Comments: Follow-up in 4 weeks  Lungs:  [x]   Clear to auscultation               []  Other:   Heart:    [x]   Regular rhythm             []  irregular rhythm    [x]   Previous H&P reviewed, patient examined and there are NO CHANGES                 []   Previous H&P reviewed, patient examined and there are changes noted.   Mordecai RasmussenJohn Navada Osterhout, MD 8/14/201910:57 AM

## 2017-11-07 NOTE — Anesthesia Procedure Notes (Signed)
Date/Time: 11/07/2017 10:57 AM Performed by: Junious SilkNoles, Chelcey Caputo, CRNA Pre-anesthesia Checklist: Patient identified, Emergency Drugs available, Suction available, Patient being monitored and Timeout performed Oxygen Delivery Method: Ambu bag

## 2017-11-07 NOTE — H&P (Signed)
Jenna Dudley is an 82 y.o. female.   Chief Complaint: Patient has no chief complaint.  Chronic back pain chronic ear pain no other physical problems.  Mood has been stable. HPI: History of long-standing problems with recurrent psychotic depression currently stable on medication and maintenance ECT  Past Medical History:  Diagnosis Date  . Bilateral carpal tunnel syndrome 07/27/14  . Bipolar disorder [facility](HCC)    geropsych admission [facility] in Dec 2018  . Chronic diastolic CHF (congestive heart failure) (HCC)    a. 03/2017 Echo: EF 55-60%, no rwma, Gr1 DD, mild MR.  . Chronic low back pain 07/29/14  . Degenerative arthritis of lumbar spine 07/27/14  . Dementia   . Depression   . Esophageal spasm 07/29/14  . GERD (gastroesophageal reflux disease)   . Hyperlipemia 07/29/14  . Hypertension   . Spinal stenosis 07/29/14    Past Surgical History:  Procedure Laterality Date  . HIP SURGERY Right   . INNER EAR SURGERY Right   . REPLACEMENT TOTAL KNEE BILATERAL Bilateral 07/27/14    Family History  Problem Relation Age of Onset  . Hypertension Mother   . Stroke Mother    Social History:  reports that she quit smoking about 42 years ago. Her smoking use included cigarettes. She has never used smokeless tobacco. She reports that she does not drink alcohol or use drugs.  Allergies:  Allergies  Allergen Reactions  . Aspirin Other (See Comments)    Unknown reaction  . Sulfa Antibiotics Other (See Comments)    Unknown reaction     (Not in a hospital admission)  No results found for this or any previous visit (from the past 48 hour(s)). No results found.  Review of Systems  Constitutional: Negative.   HENT: Negative.   Eyes: Negative.   Respiratory: Negative.   Cardiovascular: Negative.   Gastrointestinal: Negative.   Musculoskeletal: Negative.   Skin: Negative.   Neurological: Negative.   Psychiatric/Behavioral: Positive for memory loss. Negative for depression, hallucinations,  substance abuse and suicidal ideas. The patient is nervous/anxious. The patient does not have insomnia.     Blood pressure (!) 139/56, pulse 83, temperature 97.8 F (36.6 C), temperature source Oral, resp. rate 18, height 5' (1.524 m), weight 67.6 kg. Physical Exam  Nursing note and vitals reviewed. Constitutional: She appears well-developed and well-nourished.  HENT:  Head: Normocephalic and atraumatic.  Eyes: Pupils are equal, round, and reactive to light. Conjunctivae are normal.  Neck: Normal range of motion.  Cardiovascular: Regular rhythm and normal heart sounds.  Respiratory: Effort normal. No respiratory distress.  GI: Soft.  Musculoskeletal: Normal range of motion.  Neurological: She is alert.  Skin: Skin is warm and dry.  Psychiatric: Her speech is normal and behavior is normal. Judgment and thought content normal. Her mood appears anxious. Cognition and memory are impaired. She exhibits abnormal recent memory.     Assessment/Plan Patient is clearly showing more problems with chronic memory loss as reflected in the Mini-Mental status exam however given her severity of separate depression I think it is still in her best interest to continue maintenance ECT we will see her back in another month.  Mordecai RasmussenJohn Alexandr Yaworski, MD 11/07/2017, 10:56 AM

## 2017-11-07 NOTE — Anesthesia Preprocedure Evaluation (Signed)
Anesthesia Evaluation  Patient identified by MRN, date of birth, ID band Patient awake    Reviewed: Allergy & Precautions, H&P , NPO status , reviewed documented beta blocker date and time   Airway Mallampati: III  TM Distance: >3 FB Neck ROM: full    Dental  (+) Missing, Poor Dentition   Pulmonary former smoker,    Pulmonary exam normal        Cardiovascular hypertension, +CHF  Normal cardiovascular exam     Neuro/Psych PSYCHIATRIC DISORDERS Depression Bipolar Disorder Dementia  Neuromuscular disease    GI/Hepatic GERD  Controlled,  Endo/Other    Renal/GU      Musculoskeletal  (+) Arthritis ,   Abdominal   Peds  Hematology   Anesthesia Other Findings Past Medical History: 07/27/14: Bilateral carpal tunnel syndrome No date: Bipolar disorder (HCC)     Comment:  geropsych admission to [facility] in Dec 2018 No date: Chronic diastolic CHF (congestive heart failure) (HCC)     Comment:  a. 03/2017 Echo: EF 55-60%, no rwma, Gr1 DD, mild MR. 07/29/14: Chronic low back pain 07/27/14: Degenerative arthritis of lumbar spine No date: Dementia No date: Depression 07/29/14: Esophageal spasm No date: GERD (gastroesophageal reflux disease) 07/29/14: Hyperlipemia No date: Hypertension 07/29/14: Spinal stenosis  Past Surgical History: No date: HIP SURGERY; Right No date: INNER EAR SURGERY; Right 07/27/14: REPLACEMENT TOTAL KNEE BILATERAL; Bilateral  BMI    Body Mass Index:  29.10 kg/m      Reproductive/Obstetrics                             Anesthesia Physical Anesthesia Plan  ASA: III  Anesthesia Plan: General   Post-op Pain Management:    Induction: Intravenous  PONV Risk Score and Plan: Treatment may vary due to age or medical condition and TIVA  Airway Management Planned: Mask  Additional Equipment:   Intra-op Plan:   Post-operative Plan:   Informed Consent: I have reviewed the  patients History and Physical, chart, labs and discussed the procedure including the risks, benefits and alternatives for the proposed anesthesia with the patient or authorized representative who has indicated his/her understanding and acceptance.   Dental Advisory Given  Plan Discussed with: CRNA  Anesthesia Plan Comments:         Anesthesia Quick Evaluation

## 2017-11-07 NOTE — Discharge Instructions (Signed)
1)  The drugs that you have been given will stay in your system until tomorrow so for the       next 24 hours you should not: ° A. Drive an automobile ° B. Make any legal decisions ° C. Drink any alcoholic beverages ° °2)  You may resume your regular meals upon return home. ° °3)  A responsible adult must take you home.  Someone should stay with you for a few          hours, then be available by phone for the remainder of the treatment day. ° °4)  You May experience any of the following symptoms: ° Headache, Nausea and a dry mouth (due to the medications you were given),  temporary memory loss and some confusion, or sore muscles (a warm bath  should help this).  If you you experience any of these symptoms let us know on                your return visit. ° °5)  Report any of the following: any acute discomfort, severe headache, or temperature        greater than 100.5 F.   Also report any unusual redness, swelling, drainage, or pain         at your IV site. ° °  You may report Symptoms to:  ECT PROGRAM- [place] at [facility] °         Phone: [phone], ECT Department  °         or Dr. Clapac's office [phone] ° °6)  Your next ECT Treatment is Wednesday September 11 ° We will call 2 days prior to your scheduled appointment for arrival times. ° °7)  Nothing to eat or drink after midnight the night before your procedure. ° °8)  Take     With a sip of water the morning of your procedure. ° °9)  Other Instructions: Call [phone] to cancel the morning of your procedure due         to illness or emergency. ° °10) We will call within 72 hours to assess how you are feeling.  °

## 2017-11-07 NOTE — Anesthesia Post-op Follow-up Note (Signed)
Anesthesia QCDR form completed.        

## 2017-11-07 NOTE — Anesthesia Postprocedure Evaluation (Signed)
Anesthesia Post Note  Patient: Jenna Dudley  Procedure(s) Performed: ECT TX  Patient location during evaluation: PACU Anesthesia Type: General Level of consciousness: awake and alert Pain management: pain level controlled Vital Signs Assessment: post-procedure vital signs reviewed and stable Respiratory status: spontaneous breathing, nonlabored ventilation and respiratory function stable Cardiovascular status: blood pressure returned to baseline and stable Postop Assessment: no apparent nausea or vomiting Anesthetic complications: no     Last Vitals:  Vitals:   11/07/17 1130 11/07/17 1159  BP: 111/64 (!) 115/55  Pulse: 77 78  Resp:  16  Temp:  36.6 C    Last Pain:  Vitals:   11/07/17 1159  TempSrc: Oral  PainSc: 0-No pain                 Christia ReadingScott T Karnisha Lefebre

## 2017-11-19 ENCOUNTER — Telehealth: Payer: Self-pay | Admitting: *Deleted

## 2017-11-21 ENCOUNTER — Encounter
Admission: RE | Admit: 2017-11-21 | Discharge: 2017-11-21 | Disposition: A | Discharge status: Home / Self Care | Payer: Medicare Other | Source: Ambulatory Visit | Attending: Psychiatry | Admitting: Psychiatry

## 2017-11-21 ENCOUNTER — Encounter: Payer: Self-pay | Admitting: Anesthesiology

## 2017-11-21 ENCOUNTER — Other Ambulatory Visit: Payer: Self-pay | Admitting: Psychiatry

## 2017-11-21 DIAGNOSIS — F333 Major depressive disorder, recurrent, severe with psychotic symptoms: Secondary | ICD-10-CM | POA: Insufficient documentation

## 2017-11-21 DIAGNOSIS — F332 Major depressive disorder, 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 features: Secondary | ICD-10-CM

## 2017-11-21 MED ORDER — KETOROLAC TROMETHAMINE 30 MG/ML IJ SOLN
INTRAMUSCULAR | Status: AC
  Filled 2017-11-21: qty 1

## 2017-11-21 MED ORDER — KETOROLAC TROMETHAMINE 30 MG/ML IJ SOLN
30.0000 mg | Freq: Once | INTRAMUSCULAR | Status: AC
  Administered 2017-11-21: 30 mg via INTRAVENOUS

## 2017-11-21 MED ORDER — SODIUM CHLORIDE 0.9 % IV SOLN
INTRAVENOUS | Status: DC | PRN
  Administered 2017-11-21: 09:00:00 via INTRAVENOUS

## 2017-11-21 MED ORDER — SODIUM CHLORIDE 0.9 % IV SOLN
500.0000 mL | Freq: Once | INTRAVENOUS | Status: AC
  Administered 2017-11-21: 500 mL via INTRAVENOUS

## 2017-11-21 MED ORDER — SUCCINYLCHOLINE CHLORIDE 20 MG/ML IJ SOLN
INTRAMUSCULAR | Status: AC
  Filled 2017-11-21: qty 1

## 2017-11-21 MED ORDER — LABETALOL HCL 5 MG/ML IV SOLN
INTRAVENOUS | Status: DC | PRN
  Administered 2017-11-21: 20 mg via INTRAVENOUS

## 2017-11-21 MED ORDER — METHOHEXITAL SODIUM 100 MG/10ML IV SOSY
PREFILLED_SYRINGE | INTRAVENOUS | Status: DC | PRN
  Administered 2017-11-21: 60 mg via INTRAVENOUS

## 2017-11-21 MED ORDER — LABETALOL HCL 5 MG/ML IV SOLN
INTRAVENOUS | Status: AC
  Filled 2017-11-21: qty 4

## 2017-11-21 MED ORDER — SUCCINYLCHOLINE CHLORIDE 200 MG/10ML IV SOSY
PREFILLED_SYRINGE | INTRAVENOUS | Status: DC | PRN
  Administered 2017-11-21: 80 mg via INTRAVENOUS

## 2017-11-21 NOTE — Anesthesia Post-op Follow-up Note (Signed)
Anesthesia QCDR form completed.        

## 2017-11-21 NOTE — Discharge Instructions (Signed)
1)  The drugs that you have been given will stay in your system until tomorrow so for the       next 24 hours you should not:  A. Drive an automobile  B. Make any legal decisions  C. Drink any alcoholic beverages  2)  You may resume your regular meals upon return home.  3)  A responsible adult must take you home.  Someone should stay with you for a few          hours, then be available by phone for the remainder of the treatment day.  4)  You May experience any of the following symptoms:  Headache, Nausea and a dry mouth (due to the medications you were given),  temporary memory loss and some confusion, or sore muscles (a warm bath  should help this).  If you you experience any of these symptoms let us know on                your return visit.  5)  Report any of the following: any acute discomfort, severe headache, or temperature        greater than 100.5 F.   Also report any unusual redness, swelling, drainage, or pain         at your IV site.    You may report Symptoms to:  ECT PROGRAM- [place] [facility]          Phone: [phone], ECT Department           or Dr. Shary Keylapac's office [phone]  6)  Your next ECT Treatment is Day Wednesday  Date December 05, 2017  We will call 2 days prior to your scheduled appointment for arrival times.  7)  Nothing to eat or drink after midnight the night before your procedure.  8)  Take .     With a sip of water the morning of your procedure.  9)  Other Instructions: Call [phone] to cancel the morning of your procedure due         to illness or emergency.  10) We will call within 72 hours to assess how you are feeling.

## 2017-11-21 NOTE — Transfer of Care (Signed)
Immediate Anesthesia Transfer of Care Note  Patient: Jenna Dudley  Procedure(s) Performed: ECT TX  Patient Location: PACU  Anesthesia Type:General  Level of Consciousness: sedated  Airway & Oxygen Therapy: Patient Spontanous Breathing and Patient connected to face mask oxygen  Post-op Assessment: Report given to RN and Post -op Vital signs reviewed and stable  Post vital signs: Reviewed and stable  Last Vitals:  Vitals Value Taken Time  BP 91/46 11/21/2017 10:51 AM  Temp 36.5 C 11/21/2017 10:50 AM  Pulse 81 11/21/2017 10:52 AM  Resp 25 11/21/2017 10:52 AM  SpO2 100 % 11/21/2017 10:52 AM  Vitals shown include unvalidated device data.  Last Pain:  Vitals:   11/21/17 0846  TempSrc:   PainSc: 0-No pain         Complications: No apparent anesthesia complications

## 2017-11-21 NOTE — H&P (Signed)
Jenna DaftJoan M Dudley is an 82 y.o. female.   Chief Complaint: Patient is clearly more depressed down and flat but also having her anxiety with some confused probably delusional thinking.  No physical complaints different from baseline back pain HPI: Patient has a history of recurrent psychotic depression which is usually marked at first by intrusive negativity guilt and rumination  Past Medical History:  Diagnosis Date  . Bilateral carpal tunnel syndrome 07/27/14  . Bipolar disorder [facility](HCC)    geropsych admission [facility] in Dec 2018  . Chronic diastolic CHF (congestive heart failure) (HCC)    a. 03/2017 Echo: EF 55-60%, no rwma, Gr1 DD, mild MR.  . Chronic low back pain 07/29/14  . Degenerative arthritis of lumbar spine 07/27/14  . Dementia   . Depression   . Esophageal spasm 07/29/14  . GERD (gastroesophageal reflux disease)   . Hyperlipemia 07/29/14  . Hypertension   . Spinal stenosis 07/29/14    Past Surgical History:  Procedure Laterality Date  . HIP SURGERY Right   . INNER EAR SURGERY Right   . REPLACEMENT TOTAL KNEE BILATERAL Bilateral 07/27/14    Family History  Problem Relation Age of Onset  . Hypertension Mother   . Stroke Mother    Social History:  reports that she quit smoking about 42 years ago. Her smoking use included cigarettes. She has never used smokeless tobacco. She reports that she does not drink alcohol or use drugs.  Allergies:  Allergies  Allergen Reactions  . Aspirin Other (See Comments)    Unknown reaction  . Sulfa Antibiotics Other (See Comments)    Unknown reaction     (Not in a hospital admission)  No results found for this or any previous visit (from the past 48 hour(s)). No results found.  Review of Systems  Constitutional: Negative.   HENT: Negative.   Eyes: Negative.   Respiratory: Negative.   Cardiovascular: Negative.   Gastrointestinal: Negative.   Musculoskeletal: Negative.   Skin: Negative.   Neurological: Negative.    Psychiatric/Behavioral: Positive for depression and memory loss. Negative for hallucinations, substance abuse and suicidal ideas. The patient is nervous/anxious and has insomnia.     Blood pressure (!) 155/57, pulse 92, temperature 97.8 F (36.6 C), temperature source Oral, resp. rate 18, height 5' (1.524 m), weight 67.1 kg, SpO2 98 %. Physical Exam  Nursing note and vitals reviewed. Constitutional: She appears well-developed and well-nourished.  HENT:  Head: Normocephalic and atraumatic.  Eyes: Pupils are equal, round, and reactive to light. Conjunctivae are normal.  Neck: Normal range of motion.  Cardiovascular: Normal heart sounds.  Respiratory: Effort normal.  GI: Soft.  Musculoskeletal: Normal range of motion.  Neurological: She is alert.  Skin: Skin is warm and dry.  Psychiatric: Her mood appears anxious. Her speech is delayed and tangential. She is agitated. She is not aggressive. Thought content is paranoid. Cognition and memory are impaired. She expresses inappropriate judgment. She exhibits a depressed mood. She expresses no homicidal and no suicidal ideation.     Assessment/Plan ECT today.  Because I think she is starting to get more demented I am concerned about not doing too many in a row so we will plan to see her back in 2 weeks but I am open to seeing her sooner if necessary as well.  Mordecai RasmussenJohn Clapacs, MD 11/21/2017, 10:33 AM

## 2017-11-21 NOTE — Progress Notes (Signed)
When patient came from PACU she was speaking excessively about the same topic. She kept saying,"I don't want to be any trouble for anyone. I have a good husband. I don't want to be a pain for him. I have done some horrible things. I have made a lot of mistakes. Thank you for taking care of me. You all are good to me but I have done some things in the past." She made the same statements repeatedly for the entire 20 minutes of her discharge preparation. Dr. Toni Amendlapacs notified.

## 2017-11-21 NOTE — Anesthesia Procedure Notes (Signed)
Date/Time: 11/21/2017 10:40 AM Performed by: Lily KocherPeralta, Ajahni Nay, CRNA Pre-anesthesia Checklist: Patient identified, Emergency Drugs available, Suction available and Patient being monitored Patient Re-evaluated:Patient Re-evaluated prior to induction Oxygen Delivery Method: Circle system utilized Preoxygenation: Pre-oxygenation with 100% oxygen Induction Type: IV induction Ventilation: Mask ventilation without difficulty and Mask ventilation throughout procedure Airway Equipment and Method: Bite block Placement Confirmation: positive ETCO2 Dental Injury: Teeth and Oropharynx as per pre-operative assessment

## 2017-11-21 NOTE — Procedures (Signed)
ECT SERVICES Physician's Interval Evaluation & Treatment Note  Patient Identification: Jenna Dudley MRN:  [id number] Date of Evaluation:  11/21/2017 TX #: 11  MADRS:   MMSE:   P.E. Findings:  No change to physical exam  Psychiatric Interval Note:  Clearly having a return of her symptoms she is paranoid and confused  Subjective:  Patient is a 82 y.o. female seen for evaluation for Electroconvulsive Therapy. Confused negative thinking excessive and strange guilt  Treatment Summary:   [x]   Right Unilateral             []  Bilateral   % Energy : 0.3 ms 100%   Impedance: 1710 ohms  Seizure Energy Index: 3276 V squared  Postictal Suppression Index: Less than 10%  Seizure Concordance Index: 74%  Medications  Pre Shock: Brevital 60 mg succinylcholine 80 mg  Post Shock:    Seizure Duration: EMG 18 seconds EEG 54 seconds   Comments: Follow-up 2 weeks although I am open to seeing her back sooner if Channing MuttersRoy judges that that would be helpful.  Lungs:  [x]   Clear to auscultation               []  Other:   Heart:    [x]   Regular rhythm             []  irregular rhythm    [x]   Previous H&P reviewed, patient examined and there are NO CHANGES                 []   Previous H&P reviewed, patient examined and there are changes noted.   Mordecai RasmussenJohn Clapacs, MD 8/28/201910:35 AM

## 2017-11-22 NOTE — Anesthesia Preprocedure Evaluation (Signed)
Anesthesia Evaluation  Patient identified by MRN, date of birth, ID band Patient awake    Reviewed: Allergy & Precautions, H&P , NPO status , Patient's Chart, lab work & pertinent test results, reviewed documented beta blocker date and time   Airway Mallampati: II   Neck ROM: full    Dental  (+) Poor Dentition   Pulmonary neg pulmonary ROS, former smoker,    Pulmonary exam normal        Cardiovascular hypertension, negative cardio ROS Normal cardiovascular exam Rhythm:regular Rate:Normal     Neuro/Psych negative neurological ROS  negative psych ROS   GI/Hepatic negative GI ROS, Neg liver ROS, GERD  ,  Endo/Other  negative endocrine ROS  Renal/GU negative Renal ROS  negative genitourinary   Musculoskeletal   Abdominal   Peds  Hematology negative hematology ROS (+)   Anesthesia Other Findings Past Medical History: 07/27/14: Bilateral carpal tunnel syndrome No date: Bipolar disorder (HCC)     Comment:  [facility] No date: Chronic diastolic CHF (congestive heart failure) (HCC)     Comment:  a. 03/2017 Echo: EF 55-60%, no rwma, Gr1 DD, mild MR. 07/29/14: Chronic low back pain 07/27/14: Degenerative arthritis of lumbar spine No date: Dementia No date: Depression 07/29/14: Esophageal spasm No date: GERD (gastroesophageal reflux disease) 07/29/14: Hyperlipemia No date: Hypertension 07/29/14: Spinal stenosis Past Surgical History: No date: HIP SURGERY; Right No date: INNER EAR SURGERY; Right 07/27/14: REPLACEMENT TOTAL KNEE BILATERAL; Bilateral BMI    Body Mass Index:  28.90 kg/m     Reproductive/Obstetrics negative OB ROS                             Anesthesia Physical Anesthesia Plan  ASA: III  Anesthesia Plan: General   Post-op Pain Management:    Induction:   PONV Risk Score and Plan:   Airway Management Planned:   Additional Equipment:    Intra-op Plan:   Post-operative Plan:   Informed Consent: I have reviewed the patients History and Physical, chart, labs and discussed the procedure including the risks, benefits and alternatives for the proposed anesthesia with the patient or authorized representative who has indicated his/her understanding and acceptance.   Dental Advisory Given  Plan Discussed with: CRNA  Anesthesia Plan Comments:         Anesthesia Quick Evaluation

## 2017-11-22 NOTE — Anesthesia Postprocedure Evaluation (Signed)
Anesthesia Post Note  Patient: Jenna Dudley  Procedure(s) Performed: ECT TX  Patient location during evaluation: PACU Anesthesia Type: General Level of consciousness: awake and alert Pain management: pain level controlled Vital Signs Assessment: post-procedure vital signs reviewed and stable Respiratory status: spontaneous breathing, nonlabored ventilation, respiratory function stable and patient connected to nasal cannula oxygen Cardiovascular status: blood pressure returned to baseline and stable Postop Assessment: no apparent nausea or vomiting Anesthetic complications: no     Last Vitals:  Vitals:   11/21/17 1121 11/21/17 1127  BP:  (!) 148/86  Pulse: 76   Resp:    Temp:    SpO2:      Last Pain:  Vitals:   11/21/17 1121  TempSrc:   PainSc: 0-No pain                 [name]

## 2017-11-28 ENCOUNTER — Other Ambulatory Visit: Payer: Self-pay | Admitting: Psychiatry

## 2017-11-28 ENCOUNTER — Telehealth: Payer: Self-pay | Admitting: *Deleted

## 2017-11-28 MED ORDER — OLANZAPINE 20 MG PO TABS: 20.0000 mg | ORAL_TABLET | Freq: Every day | ORAL | 1 refills | Status: DC

## 2017-11-29 ENCOUNTER — Other Ambulatory Visit: Payer: Self-pay | Admitting: Psychiatry

## 2017-11-30 ENCOUNTER — Encounter: Payer: Self-pay | Admitting: Anesthesiology

## 2017-11-30 ENCOUNTER — Encounter
Admission: RE | Admit: 2017-11-30 | Discharge: 2017-11-30 | Disposition: A | Discharge status: Home / Self Care | Payer: Medicare Other | Source: Ambulatory Visit | Attending: Psychiatry | Admitting: Psychiatry

## 2017-11-30 DIAGNOSIS — F0390 Unspecified dementia without behavioral disturbance: Secondary | ICD-10-CM | POA: Diagnosis not present

## 2017-11-30 DIAGNOSIS — F333 Major depressive disorder, recurrent, severe with psychotic symptoms: Secondary | ICD-10-CM

## 2017-11-30 DIAGNOSIS — I5032 Chronic diastolic (congestive) heart failure: Secondary | ICD-10-CM | POA: Insufficient documentation

## 2017-11-30 DIAGNOSIS — I110 Hypertensive heart disease with heart failure: Secondary | ICD-10-CM | POA: Insufficient documentation

## 2017-11-30 DIAGNOSIS — K219 Gastro-esophageal reflux disease without esophagitis: Secondary | ICD-10-CM | POA: Diagnosis not present

## 2017-11-30 DIAGNOSIS — Z8249 Family history of ischemic heart disease and other diseases of the circulatory system: Secondary | ICD-10-CM | POA: Diagnosis not present

## 2017-11-30 DIAGNOSIS — Z882 Allergy status to sulfonamides status: Secondary | ICD-10-CM | POA: Insufficient documentation

## 2017-11-30 DIAGNOSIS — Z87891 Personal history of nicotine dependence: Secondary | ICD-10-CM | POA: Insufficient documentation

## 2017-11-30 DIAGNOSIS — Z96653 Presence of artificial knee joint, bilateral: Secondary | ICD-10-CM | POA: Insufficient documentation

## 2017-11-30 DIAGNOSIS — F323 Major depressive disorder, single episode, severe with psychotic features: Secondary | ICD-10-CM | POA: Insufficient documentation

## 2017-11-30 MED ORDER — SODIUM CHLORIDE 0.9 % IV SOLN
INTRAVENOUS | Status: DC | PRN
  Administered 2017-11-30: 10:00:00 via INTRAVENOUS

## 2017-11-30 MED ORDER — SODIUM CHLORIDE 0.9 % IV SOLN
500.0000 mL | Freq: Once | INTRAVENOUS | Status: AC
  Administered 2017-11-30: 500 mL via INTRAVENOUS

## 2017-11-30 MED ORDER — KETOROLAC TROMETHAMINE 30 MG/ML IJ SOLN
30.0000 mg | Freq: Once | INTRAMUSCULAR | Status: AC
  Administered 2017-11-30: 30 mg via INTRAVENOUS

## 2017-11-30 MED ORDER — METHOHEXITAL SODIUM 100 MG/10ML IV SOSY
PREFILLED_SYRINGE | INTRAVENOUS | Status: DC | PRN
  Administered 2017-11-30: 60 mg via INTRAVENOUS
  Administered 2017-11-30: 20 mg via INTRAVENOUS

## 2017-11-30 MED ORDER — KETOROLAC TROMETHAMINE 30 MG/ML IJ SOLN
INTRAMUSCULAR | Status: AC
  Filled 2017-11-30: qty 1

## 2017-11-30 MED ORDER — SUCCINYLCHOLINE CHLORIDE 20 MG/ML IJ SOLN
INTRAMUSCULAR | Status: DC | PRN
  Administered 2017-11-30: 80 mg via INTRAVENOUS

## 2017-11-30 MED ORDER — LABETALOL HCL 5 MG/ML IV SOLN
INTRAVENOUS | Status: DC | PRN
  Administered 2017-11-30: 20 mg via INTRAVENOUS

## 2017-11-30 NOTE — Anesthesia Preprocedure Evaluation (Signed)
Anesthesia Evaluation  Patient identified by MRN, date of birth, ID band Patient awake    Reviewed: Allergy & Precautions, H&P , NPO status , Patient's Chart, lab work & pertinent test results, reviewed documented beta blocker date and time   Airway Mallampati: II   Neck ROM: full    Dental  (+) Poor Dentition   Pulmonary neg pulmonary ROS, former smoker,    Pulmonary exam normal        Cardiovascular Exercise Tolerance: Poor hypertension, On Medications +CHF  negative cardio ROS Normal cardiovascular exam Rhythm:regular Rate:Normal     Neuro/Psych PSYCHIATRIC DISORDERS Depression Bipolar Disorder Dementia  Neuromuscular disease negative neurological ROS  negative psych ROS   GI/Hepatic negative GI ROS, Neg liver ROS, GERD  ,  Endo/Other  negative endocrine ROS  Renal/GU negative Renal ROS  negative genitourinary   Musculoskeletal   Abdominal   Peds  Hematology negative hematology ROS (+)   Anesthesia Other Findings Past Medical History: 07/27/14: Bilateral carpal tunnel syndrome No date: Bipolar disorder (HCC)     Comment:  geropsych admission to [facility] in Dec 2018 No date: Chronic diastolic CHF (congestive heart failure) (HCC)     Comment:  a. 03/2017 Echo: EF 55-60%, no rwma, Gr1 DD, mild MR. 07/29/14: Chronic low back pain 07/27/14: Degenerative arthritis of lumbar spine No date: Dementia No date: Depression 07/29/14: Esophageal spasm No date: GERD (gastroesophageal reflux disease) 07/29/14: Hyperlipemia No date: Hypertension 07/29/14: Spinal stenosis Past Surgical History: No date: HIP SURGERY; Right No date: INNER EAR SURGERY; Right 07/27/14: REPLACEMENT TOTAL KNEE BILATERAL; Bilateral BMI    Body Mass Index:  28.90 kg/m     Reproductive/Obstetrics negative OB ROS                             Anesthesia Physical Anesthesia Plan  ASA: III  Anesthesia Plan: General   Post-op  Pain Management:    Induction:   PONV Risk Score and Plan:   Airway Management Planned:   Additional Equipment:   Intra-op Plan:   Post-operative Plan:   Informed Consent: I have reviewed the patients History and Physical, chart, labs and discussed the procedure including the risks, benefits and alternatives for the proposed anesthesia with the patient or authorized representative who has indicated his/her understanding and acceptance.   Dental Advisory Given  Plan Discussed with: CRNA  Anesthesia Plan Comments:         Anesthesia Quick Evaluation

## 2017-11-30 NOTE — H&P (Signed)
Jenna Dudley is an 82 y.o. female.   Chief Complaint: hallucinations  HPI: chronic depression and psychosis with persistant hallucinations  Past Medical History:  Diagnosis Date  . Bilateral carpal tunnel syndrome 07/27/14  . Bipolar disorder [facility])    geropsych admission to [facility] in Dec 2018  . Chronic diastolic CHF (congestive heart failure) (HCC)    a. 03/2017 Echo: EF 55-60%, no rwma, Gr1 DD, mild MR.  . Chronic low back pain 07/29/14  . Degenerative arthritis of lumbar spine 07/27/14  . Dementia   . Depression   . Esophageal spasm 07/29/14  . GERD (gastroesophageal reflux disease)   . Hyperlipemia 07/29/14  . Hypertension   . Spinal stenosis 07/29/14    Past Surgical History:  Procedure Laterality Date  . HIP SURGERY Right   . INNER EAR SURGERY Right   . REPLACEMENT TOTAL KNEE BILATERAL Bilateral 07/27/14    Family History  Problem Relation Age of Onset  . Hypertension Mother   . Stroke Mother    Social History:  reports that she quit smoking about 42 years ago. Her smoking use included cigarettes. She has never used smokeless tobacco. She reports that she does not drink alcohol or use drugs.  Allergies:  Allergies  Allergen Reactions  . Aspirin Other (See Comments)    Unknown reaction  . Sulfa Antibiotics Other (See Comments)    Unknown reaction     (Not in a hospital admission)  No results found for this or any previous visit (from the past 48 hour(s)). No results found.  Review of Systems  Constitutional: Negative.   HENT: Negative.   Eyes: Negative.   Respiratory: Negative.   Cardiovascular: Negative.   Gastrointestinal: Negative.   Musculoskeletal: Positive for back pain.  Skin: Negative.   Neurological: Negative.   Psychiatric/Behavioral: Positive for hallucinations and memory loss. Negative for depression, substance abuse and suicidal ideas. The patient is nervous/anxious and has insomnia.     Blood pressure (!) 148/59, pulse 91, temperature 98.2 F  (36.8 C), temperature source Oral, resp. rate 18, height 5' (1.524 m), weight 67.1 kg, SpO2 100 %. Physical Exam  Nursing note and vitals reviewed. Constitutional: She appears well-developed and well-nourished.  HENT:  Head: Normocephalic and atraumatic.  Eyes: Pupils are equal, round, and reactive to light. Conjunctivae are normal.  Neck: Normal range of motion.  Cardiovascular: Regular rhythm and normal heart sounds.  Respiratory: Effort normal. No respiratory distress.  GI: Soft.  Musculoskeletal: Normal range of motion.  Neurological: She is alert.  Skin: Skin is warm and dry.  Psychiatric: Judgment normal. Her affect is blunt. Her speech is delayed. She is slowed. Thought content is paranoid. She expresses no homicidal and no suicidal ideation. She exhibits abnormal recent memory.     Assessment/Plan Treatment and increase zyprexa. Return Monday to reassess  Mordecai Rasmussen, MD 11/30/2017, 10:00 AM

## 2017-11-30 NOTE — Transfer of Care (Signed)
Immediate Anesthesia Transfer of Care Note  Patient: Jenna Dudley  Procedure(s) Performed: ECT TX  Patient Location: PACU  Anesthesia Type:General  Level of Consciousness: sedated  Airway & Oxygen Therapy: Patient Spontanous Breathing and Patient connected to face mask oxygen  Post-op Assessment: Report given to RN and Post -op Vital signs reviewed and stable  Post vital signs: Reviewed and stable  Last Vitals:  Vitals Value Taken Time  BP 158/71 11/30/2017 10:42 AM  Temp    Pulse 88 11/30/2017 10:44 AM  Resp 27 11/30/2017 10:44 AM  SpO2 100 % 11/30/2017 10:44 AM  Vitals shown include unvalidated device data.  Last Pain:  Vitals:   11/30/17 0822  TempSrc: Oral  PainSc: 0-No pain         Complications: No apparent anesthesia complications

## 2017-11-30 NOTE — Anesthesia Post-op Follow-up Note (Signed)
Anesthesia QCDR form completed.        

## 2017-11-30 NOTE — Anesthesia Procedure Notes (Signed)
Date/Time: 11/30/2017 10:38 AM Performed by: Junious Silk, CRNA Pre-anesthesia Checklist: Patient identified, Emergency Drugs available, Suction available, Patient being monitored and Timeout performed Oxygen Delivery Method: Ambu bag

## 2017-11-30 NOTE — Procedures (Signed)
ECT SERVICES Physician's Interval Evaluation & Treatment Note  Patient Identification: Jenna Dudley MRN:  [id number] Date of Evaluation:  11/30/2017 TX #: 12  MADRS:   MMSE:   P.E. Findings:  none  Psychiatric Interval Note:  Still having AH  Subjective:  Patient is a 82 y.o. female seen for evaluation for Electroconvulsive Therapy. anxious  Treatment Summary:   [x]   Right Unilateral             []  Bilateral   % Energy : 0.54ms 100%   Impedance: 1780 ohms  Seizure Energy Index: 3437 microv 2  Postictal Suppression Index: -  Seizure Concordance Index: 96%  Medications  Pre Shock: to 30mg , lab 20mg , brev 100mg , suc 80mg   Post Shock: -  Seizure Duration: 8s by emg, 17s by eeg   Comments: Had to get extra brevatol due to infiltrated iv   Lungs:  [x]   Clear to auscultation               []  Other:   Heart:    [x]   Regular rhythm             []  irregular rhythm    [x]   Previous H&P reviewed, patient examined and there are NO CHANGES                 []   Previous H&P reviewed, patient examined and there are changes noted.   Mordecai Rasmussen, MD 9/6/201910:23 AM

## 2017-11-30 NOTE — Discharge Instructions (Signed)
1)  The drugs that you have been given will stay in your system until tomorrow so for the       next 24 hours you should not:  A. Drive an automobile  B. Make any legal decisions  C. Drink any alcoholic beverages  2)  You may resume your regular meals upon return home.  3)  A responsible adult must take you home.  Someone should stay with you for a few          hours, then be available by phone for the remainder of the treatment day.  4)  You May experience any of the following symptoms:  Headache, Nausea and a dry mouth (due to the medications you were given),  temporary memory loss and some confusion, or sore muscles (a warm bath  should help this).  If you you experience any of these symptoms let us know on                your return visit.  5)  Report any of the following: any acute discomfort, severe headache, or temperature        greater than 100.5 F.   Also report any unusual redness, swelling, drainage, or pain         at your IV site.    You may report Symptoms to:  ECT PROGRAM- [place] at [facility]          Phone: [phone], ECT Department           or Dr. Shary Key office [phone]  6)  Your next ECT Treatment is Day MONDAY  Date December 03, 2017  We will call 2 days prior to your scheduled appointment for arrival times.  7)  Nothing to eat or drink after midnight the night before your procedure.  8)  Take LORSARTIN     With a sip of water the morning of your procedure.  9)  Other Instructions: Call [phone] to cancel the morning of your procedure due         to illness or emergency.  10) We will call within 72 hours to assess how you are feeling.

## 2017-12-02 ENCOUNTER — Other Ambulatory Visit: Payer: Self-pay | Admitting: Psychiatry

## 2017-12-03 ENCOUNTER — Encounter: Payer: Self-pay | Admitting: Anesthesiology

## 2017-12-03 ENCOUNTER — Ambulatory Visit
Admission: RE | Admit: 2017-12-03 | Discharge: 2017-12-03 | Disposition: A | Discharge status: Home / Self Care | Payer: Medicare Other | Source: Ambulatory Visit | Attending: Psychiatry | Admitting: Psychiatry

## 2017-12-03 ENCOUNTER — Telehealth: Payer: Self-pay | Admitting: *Deleted

## 2017-12-03 DIAGNOSIS — Z8249 Family history of ischemic heart disease and other diseases of the circulatory system: Secondary | ICD-10-CM | POA: Insufficient documentation

## 2017-12-03 DIAGNOSIS — I5032 Chronic diastolic (congestive) heart failure: Secondary | ICD-10-CM | POA: Diagnosis not present

## 2017-12-03 DIAGNOSIS — M545 Low back pain: Secondary | ICD-10-CM | POA: Insufficient documentation

## 2017-12-03 DIAGNOSIS — F0390 Unspecified dementia without behavioral disturbance: Secondary | ICD-10-CM | POA: Insufficient documentation

## 2017-12-03 DIAGNOSIS — F323 Major depressive disorder, single episode, severe with psychotic features: Secondary | ICD-10-CM | POA: Diagnosis present

## 2017-12-03 DIAGNOSIS — G8929 Other chronic pain: Secondary | ICD-10-CM | POA: Diagnosis not present

## 2017-12-03 DIAGNOSIS — Z882 Allergy status to sulfonamides status: Secondary | ICD-10-CM | POA: Diagnosis not present

## 2017-12-03 DIAGNOSIS — Z880 Allergy status to penicillin: Secondary | ICD-10-CM | POA: Diagnosis not present

## 2017-12-03 DIAGNOSIS — Z823 Family history of stroke: Secondary | ICD-10-CM | POA: Diagnosis not present

## 2017-12-03 DIAGNOSIS — F333 Major depressive disorder, recurrent, severe with psychotic symptoms: Secondary | ICD-10-CM

## 2017-12-03 DIAGNOSIS — I110 Hypertensive heart disease with heart failure: Secondary | ICD-10-CM | POA: Diagnosis not present

## 2017-12-03 DIAGNOSIS — Z9889 Other specified postprocedural states: Secondary | ICD-10-CM | POA: Diagnosis not present

## 2017-12-03 DIAGNOSIS — Z87891 Personal history of nicotine dependence: Secondary | ICD-10-CM | POA: Insufficient documentation

## 2017-12-03 MED ORDER — SUCCINYLCHOLINE CHLORIDE 200 MG/10ML IV SOSY
PREFILLED_SYRINGE | INTRAVENOUS | Status: DC | PRN
  Administered 2017-12-03: 80 mg via INTRAVENOUS

## 2017-12-03 MED ORDER — SODIUM CHLORIDE 0.9 % IV SOLN
500.0000 mL | Freq: Once | INTRAVENOUS | Status: AC
  Administered 2017-12-03: 500 mL via INTRAVENOUS

## 2017-12-03 MED ORDER — METHOHEXITAL SODIUM 100 MG/10ML IV SOSY
PREFILLED_SYRINGE | INTRAVENOUS | Status: DC | PRN
  Administered 2017-12-03: 60 mg via INTRAVENOUS

## 2017-12-03 MED ORDER — ESMOLOL HCL 100 MG/10ML IV SOLN
INTRAVENOUS | Status: DC | PRN
  Administered 2017-12-03: 20 mg via INTRAVENOUS

## 2017-12-03 MED ORDER — SUCCINYLCHOLINE CHLORIDE 20 MG/ML IJ SOLN
INTRAMUSCULAR | Status: AC
  Filled 2017-12-03: qty 1

## 2017-12-03 MED ORDER — LABETALOL HCL 5 MG/ML IV SOLN
INTRAVENOUS | Status: AC
  Filled 2017-12-03: qty 4

## 2017-12-03 MED ORDER — KETOROLAC TROMETHAMINE 30 MG/ML IJ SOLN
INTRAMUSCULAR | Status: AC
  Administered 2017-12-03: 30 mg via INTRAVENOUS
  Filled 2017-12-03: qty 1

## 2017-12-03 MED ORDER — KETOROLAC TROMETHAMINE 30 MG/ML IJ SOLN
30.0000 mg | Freq: Once | INTRAMUSCULAR | Status: AC
  Administered 2017-12-03: 30 mg via INTRAVENOUS

## 2017-12-03 MED ORDER — SODIUM CHLORIDE 0.9 % IV SOLN
INTRAVENOUS | Status: DC | PRN
  Administered 2017-12-03: 11:00:00 via INTRAVENOUS

## 2017-12-03 NOTE — Procedures (Signed)
ECT SERVICES Physician's Interval Evaluation & Treatment Note  Patient Identification: Jenna Dudley MRN:  [id number] Date of Evaluation:  12/03/2017 TX #: 13  MADRS: 16  MMSE: 15  P.E. Findings:  No change to physical exam vitals unremarkable heart and lungs normal.  Chronic back pain  Psychiatric Interval Note:  A little on the anxious side not grossly depressed hallucinations in the afternoon.  Some improvement.  Subjective:  Patient is a 82 y.o. female seen for evaluation for Electroconvulsive Therapy. No specific complaint  Treatment Summary:   [x]   Right Unilateral             []  Bilateral   % Energy : 0.3 ms 90%   Impedance: 2090 ohms  Seizure Energy Index: Computer for what ever reason did not pick up the seizure correctly even though there was a very clear cut and full amplitude seizure.  Postictal Suppression Index: See note above  Seizure Concordance Index: 21% which I think is probably incorrect.  Medications  Pre Shock: Toradol 30 mg labetalol 20 mg Brevital 60 mg succinylcholine 80 mg  Post Shock:    Seizure Duration: 28 seconds by EMG on my reading 50 seconds on EEG.  Computer did not pick up I have no idea why it was a very good and normal looking seizure.   Comments: Follow-up 1 week  Lungs:  [x]   Clear to auscultation               []  Other:   Heart:    [x]   Regular rhythm             []  irregular rhythm    [x]   Previous H&P reviewed, patient examined and there are NO CHANGES                 []   Previous H&P reviewed, patient examined and there are changes noted.   Mordecai Rasmussen, MD 9/9/201910:59 AM

## 2017-12-03 NOTE — Anesthesia Post-op Follow-up Note (Signed)
Anesthesia QCDR form completed.        

## 2017-12-03 NOTE — Anesthesia Procedure Notes (Signed)
Date/Time: 12/03/2017 11:05 AM Performed by: Lily Kocher, CRNA Pre-anesthesia Checklist: Patient identified, Emergency Drugs available, Suction available and Patient being monitored Patient Re-evaluated:Patient Re-evaluated prior to induction Oxygen Delivery Method: Circle system utilized Preoxygenation: Pre-oxygenation with 100% oxygen Induction Type: IV induction Ventilation: Mask ventilation without difficulty and Mask ventilation throughout procedure Airway Equipment and Method: Bite block Placement Confirmation: positive ETCO2 Dental Injury: Teeth and Oropharynx as per pre-operative assessment

## 2017-12-03 NOTE — Anesthesia Preprocedure Evaluation (Signed)
Anesthesia Evaluation  Patient identified by MRN, date of birth, ID band Patient awake    Reviewed: Allergy & Precautions, H&P , NPO status , reviewed documented beta blocker date and time   Airway Mallampati: III  TM Distance: >3 FB Neck ROM: full    Dental  (+) Missing, Poor Dentition   Pulmonary former smoker,    Pulmonary exam normal        Cardiovascular hypertension, +CHF  Normal cardiovascular exam     Neuro/Psych PSYCHIATRIC DISORDERS Depression Bipolar Disorder Dementia  Neuromuscular disease    GI/Hepatic GERD  ,  Endo/Other    Renal/GU      Musculoskeletal  (+) Arthritis ,   Abdominal   Peds  Hematology   Anesthesia Other Findings Past Medical History: 07/27/14: Bilateral carpal tunnel syndrome No date: Bipolar disorder (HCC)     Comment:  geropsych admission to [facility] in Dec 2018 No date: Chronic diastolic CHF (congestive heart failure) (HCC)     Comment:  a. 03/2017 Echo: EF 55-60%, no rwma, Gr1 DD, mild MR. 07/29/14: Chronic low back pain 07/27/14: Degenerative arthritis of lumbar spine No date: Dementia No date: Depression 07/29/14: Esophageal spasm No date: GERD (gastroesophageal reflux disease) 07/29/14: Hyperlipemia No date: Hypertension 07/29/14: Spinal stenosis  Past Surgical History: No date: HIP SURGERY; Right No date: INNER EAR SURGERY; Right 07/27/14: REPLACEMENT TOTAL KNEE BILATERAL; Bilateral  BMI    Body Mass Index:  28.71 kg/m      Reproductive/Obstetrics                             Anesthesia Physical Anesthesia Plan  ASA: III  Anesthesia Plan: General   Post-op Pain Management:    Induction: Intravenous  PONV Risk Score and Plan: Treatment may vary due to age or medical condition and TIVA  Airway Management Planned: Nasal Cannula and Natural Airway  Additional Equipment:   Intra-op Plan:   Post-operative Plan:   Informed Consent: I have  reviewed the patients History and Physical, chart, labs and discussed the procedure including the risks, benefits and alternatives for the proposed anesthesia with the patient or authorized representative who has indicated his/her understanding and acceptance.   Dental Advisory Given  Plan Discussed with: CRNA  Anesthesia Plan Comments:         Anesthesia Quick Evaluation

## 2017-12-03 NOTE — Discharge Instructions (Signed)
1)  The drugs that you have been given will stay in your system until tomorrow so for the       next 24 hours you should not:  A. Drive an automobile  B. Make any legal decisions  C. Drink any alcoholic beverages  2)  You may resume your regular meals upon return home.  3)  A responsible adult must take you home.  Someone should stay with you for a few          hours, then be available by phone for the remainder of the treatment day.  4)  You May experience any of the following symptoms:  Headache, Nausea and a dry mouth (due to the medications you were given),  temporary memory loss and some confusion, or sore muscles (a warm bath  should help this).  If you you experience any of these symptoms let us know on                your return visit.  5)  Report any of the following: any acute discomfort, severe headache, or temperature        greater than 100.5 F.   Also report any unusual redness, swelling, drainage, or pain         at your IV site.    You may report Symptoms to:  ECT PROGRAM- [place] at [facility]          Phone: [phone], ECT Department           or Dr. Shary Key office [phone]  6)  Your next ECT Treatment is Day  Date  Husband [name]) to call for follow-up treatment in 1-2 weeks  We will call 2 days prior to your scheduled appointment for arrival times.  7)  Nothing to eat or drink after midnight the night before your procedure.  8)  Take Cozaar    With a sip of water the morning of your procedure.  9)  Other Instructions: Call [phone] to cancel the morning of your procedure due         to illness or emergency.  10) We will call within 72 hours to assess how you are feeling.

## 2017-12-03 NOTE — Transfer of Care (Signed)
Immediate Anesthesia Transfer of Care Note  Patient: Jenna Dudley  Procedure(s) Performed: ECT TX  Patient Location: PACU  Anesthesia Type:General  Level of Consciousness: sedated  Airway & Oxygen Therapy: Patient Spontanous Breathing and Patient connected to face mask oxygen  Post-op Assessment: Report given to RN and Post -op Vital signs reviewed and stable  Post vital signs: Reviewed and stable  Last Vitals:  Vitals Value Taken Time  BP 140/87 12/03/2017 11:16 AM  Temp 36.3 C 12/03/2017 11:16 AM  Pulse 80 12/03/2017 11:17 AM  Resp 22 12/03/2017 11:17 AM  SpO2 100 % 12/03/2017 11:17 AM  Vitals shown include unvalidated device data.  Last Pain:  Vitals:   12/03/17 0842  TempSrc: Oral  PainSc: 0-No pain         Complications: No apparent anesthesia complications

## 2017-12-03 NOTE — H&P (Signed)
Jenna Dudley is an 82 y.o. female.   Chief Complaint: Patient herself has no specific complaint.  [name] notes that she is still having some hallucinations especially in the later part of the day HPI: History of long-standing recurrent psychotic depression  Past Medical History:  Diagnosis Date  . Bilateral carpal tunnel syndrome 07/27/14  . Bipolar disorder [facility])    geropsych admission to [facility] in Dec 2018  . Chronic diastolic CHF (congestive heart failure) (HCC)    a. 03/2017 Echo: EF 55-60%, no rwma, Gr1 DD, mild MR.  . Chronic low back pain 07/29/14  . Degenerative arthritis of lumbar spine 07/27/14  . Dementia   . Depression   . Esophageal spasm 07/29/14  . GERD (gastroesophageal reflux disease)   . Hyperlipemia 07/29/14  . Hypertension   . Spinal stenosis 07/29/14    Past Surgical History:  Procedure Laterality Date  . HIP SURGERY Right   . INNER EAR SURGERY Right   . REPLACEMENT TOTAL KNEE BILATERAL Bilateral 07/27/14    Family History  Problem Relation Age of Onset  . Hypertension Mother   . Stroke Mother    Social History:  reports that she quit smoking about 42 years ago. Her smoking use included cigarettes. She has never used smokeless tobacco. She reports that she does not drink alcohol or use drugs.  Allergies:  Allergies  Allergen Reactions  . Aspirin Other (See Comments)    Unknown reaction  . Sulfa Antibiotics Other (See Comments)    Unknown reaction     (Not in a hospital admission)  No results found for this or any previous visit (from the past 48 hour(s)). No results found.  Review of Systems  Constitutional: Negative.   HENT: Negative.   Eyes: Negative.   Respiratory: Negative.   Cardiovascular: Negative.   Gastrointestinal: Negative.   Musculoskeletal: Negative.   Skin: Negative.   Neurological: Negative.   Psychiatric/Behavioral: Positive for hallucinations. Negative for depression, memory loss, substance abuse and suicidal ideas. The patient is  nervous/anxious. The patient does not have insomnia.     Blood pressure (!) 164/68, pulse 78, temperature (!) 97.1 F (36.2 C), temperature source Oral, resp. rate 16, height 5' (1.524 m), weight 66.7 kg, SpO2 98 %. Physical Exam  Nursing note and vitals reviewed. Constitutional: She appears well-developed and well-nourished.  HENT:  Head: Normocephalic and atraumatic.  Eyes: Pupils are equal, round, and reactive to light. Conjunctivae are normal.  Neck: Normal range of motion.  Cardiovascular: Regular rhythm and normal heart sounds.  Respiratory: Effort normal. No respiratory distress.  GI: Soft.  Musculoskeletal: Normal range of motion.  Neurological: She is alert.  Skin: Skin is warm and dry.  Psychiatric: Judgment normal. Her affect is blunt. Her speech is delayed. She is slowed. Thought content is not paranoid. She expresses no homicidal and no suicidal ideation. She exhibits abnormal recent memory.     Assessment/Plan Patient is feeling better although she still is on the nervous and dysphoric side.  We will do treatment today and then see her back in another week because I do not want to cause any unnecessary memory impairment.  Mordecai Rasmussen, MD 12/03/2017, 10:56 AM

## 2017-12-04 NOTE — Anesthesia Postprocedure Evaluation (Signed)
Anesthesia Post Note  Patient: Jenna Dudley  Procedure(s) Performed: ECT TX  Patient location during evaluation: PACU Anesthesia Type: General Level of consciousness: awake and alert Pain management: pain level controlled Vital Signs Assessment: post-procedure vital signs reviewed and stable Respiratory status: spontaneous breathing, nonlabored ventilation and respiratory function stable Cardiovascular status: blood pressure returned to baseline and stable Postop Assessment: no apparent nausea or vomiting Anesthetic complications: no     Last Vitals:  Vitals:   12/03/17 1146 12/03/17 1150  BP: (!) 135/57 (!) 149/75  Pulse: 77 73  Resp: (!) 23 16  Temp: 36.4 C   SpO2: 94%     Last Pain:  Vitals:   12/03/17 1150  TempSrc:   PainSc: 0-No pain                 Christia Reading

## 2017-12-10 ENCOUNTER — Encounter (HOSPITAL_COMMUNITY)
Admission: RE | Admit: 2017-12-10 | Discharge: 2017-12-10 | Disposition: A | Discharge status: Home / Self Care | Payer: Medicare Other | Source: Ambulatory Visit | Attending: Psychiatry | Admitting: Psychiatry

## 2017-12-10 ENCOUNTER — Other Ambulatory Visit: Payer: Self-pay | Admitting: Psychiatry

## 2017-12-10 ENCOUNTER — Encounter: Payer: Self-pay | Admitting: Anesthesiology

## 2017-12-10 DIAGNOSIS — F332 Major depressive disorder, 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 features: Secondary | ICD-10-CM | POA: Diagnosis not present

## 2017-12-10 DIAGNOSIS — F323 Major depressive disorder, single episode, severe with psychotic features: Secondary | ICD-10-CM | POA: Diagnosis not present

## 2017-12-10 MED ORDER — SUCCINYLCHOLINE CHLORIDE 20 MG/ML IJ SOLN
INTRAMUSCULAR | Status: AC
  Filled 2017-12-10: qty 1

## 2017-12-10 MED ORDER — LABETALOL HCL 5 MG/ML IV SOLN
INTRAVENOUS | Status: AC
  Filled 2017-12-10: qty 4

## 2017-12-10 MED ORDER — SUCCINYLCHOLINE CHLORIDE 200 MG/10ML IV SOSY
PREFILLED_SYRINGE | INTRAVENOUS | Status: DC | PRN
  Administered 2017-12-10: 80 mg via INTRAVENOUS

## 2017-12-10 MED ORDER — METHOHEXITAL SODIUM 100 MG/10ML IV SOSY
PREFILLED_SYRINGE | INTRAVENOUS | Status: DC | PRN
  Administered 2017-12-10: 60 mg via INTRAVENOUS

## 2017-12-10 MED ORDER — SODIUM CHLORIDE 0.9 % IV SOLN
500.0000 mL | Freq: Once | INTRAVENOUS | Status: AC
  Administered 2017-12-10: 500 mL via INTRAVENOUS

## 2017-12-10 MED ORDER — LABETALOL HCL 5 MG/ML IV SOLN
INTRAVENOUS | Status: DC | PRN
  Administered 2017-12-10: 20 mg via INTRAVENOUS

## 2017-12-10 MED ORDER — SODIUM CHLORIDE 0.9 % IV SOLN
INTRAVENOUS | Status: DC | PRN
  Administered 2017-12-10 (×2): via INTRAVENOUS

## 2017-12-10 NOTE — Anesthesia Post-op Follow-up Note (Signed)
Anesthesia QCDR form completed.        

## 2017-12-10 NOTE — Transfer of Care (Signed)
Immediate Anesthesia Transfer of Care Note  Patient: Jenna DaftJoan M Dudley  Procedure(s) Performed: ECT TX  Patient Location: PACU  Anesthesia Type:General  Level of Consciousness: sedated  Airway & Oxygen Therapy: Patient Spontanous Breathing and Patient connected to face mask oxygen  Post-op Assessment: Report given to RN and Post -op Vital signs reviewed and stable  Post vital signs: Reviewed and stable  Last Vitals:  Vitals Value Taken Time  BP 130/47 12/10/2017 11:48 AM  Temp    Pulse 75 12/10/2017 11:49 AM  Resp 25 12/10/2017 11:49 AM  SpO2 100 % 12/10/2017 11:49 AM  Vitals shown include unvalidated device data.  Last Pain:  Vitals:   12/10/17 0837  TempSrc:   PainSc: 0-No pain         Complications: No apparent anesthesia complications

## 2017-12-10 NOTE — H&P (Signed)
Jenna Dudley is an 82 y.o. female.   Chief Complaint: Mood much improved stabilized and near baseline.  Still complains of hallucinations but is trying not to let them bother her HPI: History of severe recurrent psychotic depression  Past Medical History:  Diagnosis Date  . Bilateral carpal tunnel syndrome 07/27/14  . Bipolar disorder [facility](HCC)    geropsych admission to [facility] in Dec 2018  . Chronic diastolic CHF (congestive heart failure) (HCC)    a. 03/2017 Echo: EF 55-60%, no rwma, Gr1 DD, mild MR.  . Chronic low back pain 07/29/14  . Degenerative arthritis of lumbar spine 07/27/14  . Dementia   . Depression   . Esophageal spasm 07/29/14  . GERD (gastroesophageal reflux disease)   . Hyperlipemia 07/29/14  . Hypertension   . Spinal stenosis 07/29/14    Past Surgical History:  Procedure Laterality Date  . HIP SURGERY Right   . INNER EAR SURGERY Right   . REPLACEMENT TOTAL KNEE BILATERAL Bilateral 07/27/14    Family History  Problem Relation Age of Onset  . Hypertension Mother   . Stroke Mother    Social History:  reports that she quit smoking about 42 years ago. Her smoking use included cigarettes. She has never used smokeless tobacco. She reports that she does not drink alcohol or use drugs.  Allergies:  Allergies  Allergen Reactions  . Aspirin Other (See Comments)    Unknown reaction  . Sulfa Antibiotics Other (See Comments)    Unknown reaction     (Not in a hospital admission)  No results found for this or any previous visit (from the past 48 hour(s)). No results found.  Review of Systems  Constitutional: Negative.   HENT: Negative.   Eyes: Negative.   Respiratory: Negative.   Cardiovascular: Negative.   Gastrointestinal: Negative.   Musculoskeletal: Positive for back pain.  Skin: Negative.   Neurological: Negative.   Psychiatric/Behavioral: Positive for hallucinations. Negative for depression, memory loss, substance abuse and suicidal ideas. The patient is not  nervous/anxious and does not have insomnia.     Blood pressure (!) 117/52, pulse 81, temperature 97.8 F (36.6 C), temperature source Oral, resp. rate 16, height 5' (1.524 m), weight 64.3 kg, SpO2 96 %. Physical Exam  Nursing note and vitals reviewed. Constitutional: She appears well-developed and well-nourished.  HENT:  Head: Normocephalic and atraumatic.  Eyes: Pupils are equal, round, and reactive to light. Conjunctivae are normal.  Neck: Normal range of motion.  Cardiovascular: Regular rhythm and normal heart sounds.  Respiratory: Effort normal. No respiratory distress.  GI: Soft.  Musculoskeletal: Normal range of motion.  Neurological: She is alert.  Skin: Skin is warm and dry.  Psychiatric: She has a normal mood and affect. Her speech is normal and behavior is normal. Judgment and thought content normal. Cognition and memory are normal.     Assessment/Plan Follow-up in 2 weeks continue on the increased doses of antipsychotics.  Mordecai RasmussenJohn Clapacs, MD 12/10/2017, 11:31 AM

## 2017-12-10 NOTE — Discharge Instructions (Signed)
1)  The drugs that you have been given will stay in your system until tomorrow so for the       next 24 hours you should not:  A. Drive an automobile  B. Make any legal decisions  C. Drink any alcoholic beverages  2)  You may resume your regular meals upon return home.  3)  A responsible adult must take you home.  Someone should stay with you for a few          hours, then be available by phone for the remainder of the treatment day.  4)  You May experience any of the following symptoms:  Headache, Nausea and a dry mouth (due to the medications you were given),  temporary memory loss and some confusion, or sore muscles (a warm bath  should help this).  If you you experience any of these symptoms let us know on                your return visit.  5)  Report any of the following: any acute discomfort, severe headache, or temperature        greater than 100.5 F.   Also report any unusual redness, swelling, drainage, or pain         at your IV site.    You may report Symptoms to:  ECT PROGRAM- [place] [facility]          Phone: [phone], ECT Department           or Dr. Shary Keylapac's office [phone]  6)  Your next ECT Treatment is Day Monday  Date December 24, 2017  We will call 2 days prior to your scheduled appointment for arrival times.  7)  Nothing to eat or drink after midnight the night before your procedure.  8)  Take Cozaar   With a sip of water the morning of your procedure.  9)  Other Instructions: Call [phone] to cancel the morning of your procedure due         to illness or emergency.  10) We will call within 72 hours to assess how you are feeling.

## 2017-12-10 NOTE — Anesthesia Procedure Notes (Signed)
Date/Time: 12/10/2017 11:39 AM Performed by: Lily KocherPeralta, Falana Clagg, CRNA Pre-anesthesia Checklist: Patient identified, Emergency Drugs available, Suction available and Patient being monitored Patient Re-evaluated:Patient Re-evaluated prior to induction Oxygen Delivery Method: Circle system utilized Preoxygenation: Pre-oxygenation with 100% oxygen Induction Type: IV induction Ventilation: Mask ventilation without difficulty and Mask ventilation throughout procedure Airway Equipment and Method: Bite block Placement Confirmation: positive ETCO2 Dental Injury: Teeth and Oropharynx as per pre-operative assessment

## 2017-12-10 NOTE — Anesthesia Preprocedure Evaluation (Signed)
Anesthesia Evaluation  Patient identified by MRN, date of birth, ID band Patient awake    Reviewed: Allergy & Precautions, H&P , NPO status , reviewed documented beta blocker date and time   History of Anesthesia Complications Negative for: history of anesthetic complications  Airway Mallampati: III  TM Distance: >3 FB Neck ROM: full    Dental  (+) Missing, Poor Dentition   Pulmonary former smoker,    Pulmonary exam normal        Cardiovascular hypertension, +CHF  Normal cardiovascular exam     Neuro/Psych PSYCHIATRIC DISORDERS Depression Bipolar Disorder Dementia  Neuromuscular disease    GI/Hepatic GERD  ,  Endo/Other    Renal/GU      Musculoskeletal  (+) Arthritis ,   Abdominal   Peds  Hematology   Anesthesia Other Findings Past Medical History: 07/27/14: Bilateral carpal tunnel syndrome No date: Bipolar disorder (HCC)     Comment:  [facility]opsych [facility] in Dec 2018 No date: Chronic diastolic CHF (congestive heart failure) (HCC)     Comment:  a. 03/2017 Echo: EF 55-60%, no rwma, Gr1 DD, mild MR. 07/29/14: Chronic low back pain 07/27/14: Degenerative arthritis of lumbar spine No date: Dementia No date: Depression 07/29/14: Esophageal spasm No date: GERD (gastroesophageal reflux disease) 07/29/14: Hyperlipemia No date: Hypertension 07/29/14: Spinal stenosis  Past Surgical History: No date: HIP SURGERY; Right No date: INNER EAR SURGERY; Right 07/27/14: REPLACEMENT TOTAL KNEE BILATERAL; Bilateral  BMI    Body Mass Index:  28.71 kg/m      Reproductive/Obstetrics                             Anesthesia Physical  Anesthesia Plan  ASA: III  Anesthesia Plan: General   Post-op Pain Management:    Induction: Intravenous  PONV Risk Score and Plan: Treatment may vary due to age or medical condition and TIVA  Airway Management Planned: Nasal Cannula and Natural  Airway  Additional Equipment:   Intra-op Plan:   Post-operative Plan:   Informed Consent: I have reviewed the patients History and Physical, chart, labs and discussed the procedure including the risks, benefits and alternatives for the proposed anesthesia with the patient or authorized representative who has indicated his/her understanding and acceptance.   Dental Advisory Given  Plan Discussed with: CRNA  Anesthesia Plan Comments: (Patient consented for risks of anesthesia including but not limited to:  - adverse reactions to medications - risk of intubation if required - damage to teeth, lips or other oral mucosa - sore throat or hoarseness - Damage to heart, brain, lungs or loss of life  Patient voiced understanding.)        Anesthesia Quick Evaluation

## 2017-12-10 NOTE — Procedures (Signed)
ECT SERVICES Physician's Interval Evaluation & Treatment Note  Patient Identification: Galen DaftJoan M Blasko MRN:  [id number] Date of Evaluation:  12/10/2017 TX #: [id number]  MADRS:   MMSE:   P.E. Findings:  No change to physical exam  Psychiatric Interval Note:  Mood and affect much better and calm or near baseline.  Subjective:  Patient is a 82 y.o. female seen for evaluation for Electroconvulsive Therapy. Still has some hallucinations but is functioning much better  Treatment Summary:   [x]   Right Unilateral             []  Bilateral   % Energy : 0.3 ms 90%   Impedance: 1910 ohms  Seizure Energy Index: 2937 V squared  Postictal Suppression Index: 56%  Seizure Concordance Index: 93%  Medications  Pre Shock: Toradol 30 mg labetalol 20 mg Brevital 60 mg succinylcholine 80 mg  Post Shock:    Seizure Duration: 24 seconds EMG 58 seconds EEG   Comments: Follow-up in 2 weeks  Lungs:  [x]   Clear to auscultation               []  Other:   Heart:    [x]   Regular rhythm             []  irregular rhythm    [x]   Previous H&P reviewed, patient examined and there are NO CHANGES                 []   Previous H&P reviewed, patient examined and there are changes noted.   Mordecai RasmussenJohn Kalyse Meharg, MD 9/16/201911:33 AM

## 2017-12-10 NOTE — Anesthesia Postprocedure Evaluation (Signed)
Anesthesia Post Note  Patient: Jenna Dudley  Procedure(s) Performed: ECT TX  Patient location during evaluation: PACU Anesthesia Type: General Level of consciousness: awake and alert Pain management: pain level controlled Vital Signs Assessment: post-procedure vital signs reviewed and stable Respiratory status: spontaneous breathing, nonlabored ventilation, respiratory function stable and patient connected to nasal cannula oxygen Cardiovascular status: blood pressure returned to baseline and stable Postop Assessment: no apparent nausea or vomiting Anesthetic complications: no     Last Vitals:  Vitals:   12/10/17 1218 12/10/17 1233  BP: (!) 134/45 115/61  Pulse: 72 76  Resp: 20 18  Temp: 36.7 C   SpO2: 96%     Last Pain:  Vitals:   12/10/17 1233  TempSrc:   PainSc: 0-No pain                 Cleda MccreedyJoseph K Khloei Spiker

## 2017-12-11 NOTE — Anesthesia Postprocedure Evaluation (Signed)
Anesthesia Post Note  Patient: Jenna Dudley  Procedure(s) Performed: ECT TX  Patient location during evaluation: PACU Anesthesia Type: General Level of consciousness: awake and alert Pain management: pain level controlled Vital Signs Assessment: post-procedure vital signs reviewed and stable Respiratory status: spontaneous breathing, nonlabored ventilation, respiratory function stable and patient connected to nasal cannula oxygen Cardiovascular status: blood pressure returned to baseline and stable Postop Assessment: no apparent nausea or vomiting Anesthetic complications: no     Last Vitals:  Vitals:   11/30/17 1105 11/30/17 1118  BP:  (!) 137/52  Pulse:  77  Resp:  16  Temp: 36.4 C   SpO2:      Last Pain:  Vitals:   11/30/17 1118  TempSrc:   PainSc: 0-No pain                 [name]

## 2017-12-18 ENCOUNTER — Inpatient Hospital Stay
Admission: EM | Admit: 2017-12-18 | Discharge: 2017-12-25 | DRG: 871 | Disposition: A | Discharge status: Home Health | Payer: Medicare Other | Attending: Internal Medicine | Admitting: Internal Medicine

## 2017-12-18 ENCOUNTER — Emergency Department: Payer: Medicare Other

## 2017-12-18 DIAGNOSIS — J181 Lobar pneumonia, unspecified organism: Secondary | ICD-10-CM

## 2017-12-18 DIAGNOSIS — R6520 Severe sepsis without septic shock: Secondary | ICD-10-CM | POA: Diagnosis present

## 2017-12-18 DIAGNOSIS — Z882 Allergy status to sulfonamides status: Secondary | ICD-10-CM

## 2017-12-18 DIAGNOSIS — Z8673 Personal history of transient ischemic attack (TIA), and cerebral infarction without residual deficits: Secondary | ICD-10-CM

## 2017-12-18 DIAGNOSIS — Z96653 Presence of artificial knee joint, bilateral: Secondary | ICD-10-CM | POA: Diagnosis present

## 2017-12-18 DIAGNOSIS — Y95 Nosocomial condition: Secondary | ICD-10-CM | POA: Diagnosis present

## 2017-12-18 DIAGNOSIS — F319 Bipolar disorder, unspecified: Secondary | ICD-10-CM | POA: Diagnosis present

## 2017-12-18 DIAGNOSIS — M545 Low back pain: Secondary | ICD-10-CM | POA: Diagnosis present

## 2017-12-18 DIAGNOSIS — E872 Acidosis: Secondary | ICD-10-CM | POA: Diagnosis present

## 2017-12-18 DIAGNOSIS — G9340 Encephalopathy, unspecified: Secondary | ICD-10-CM | POA: Diagnosis not present

## 2017-12-18 DIAGNOSIS — R739 Hyperglycemia, unspecified: Secondary | ICD-10-CM | POA: Diagnosis present

## 2017-12-18 DIAGNOSIS — Z823 Family history of stroke: Secondary | ICD-10-CM

## 2017-12-18 DIAGNOSIS — J189 Pneumonia, unspecified organism: Secondary | ICD-10-CM | POA: Diagnosis present

## 2017-12-18 DIAGNOSIS — Z8249 Family history of ischemic heart disease and other diseases of the circulatory system: Secondary | ICD-10-CM

## 2017-12-18 DIAGNOSIS — I5032 Chronic diastolic (congestive) heart failure: Secondary | ICD-10-CM | POA: Diagnosis present

## 2017-12-18 DIAGNOSIS — D539 Nutritional anemia, unspecified: Secondary | ICD-10-CM | POA: Diagnosis present

## 2017-12-18 DIAGNOSIS — Z886 Allergy status to analgesic agent status: Secondary | ICD-10-CM

## 2017-12-18 DIAGNOSIS — A419 Sepsis, unspecified organism: Principal | ICD-10-CM | POA: Diagnosis present

## 2017-12-18 DIAGNOSIS — Z79899 Other long term (current) drug therapy: Secondary | ICD-10-CM

## 2017-12-18 DIAGNOSIS — I10 Essential (primary) hypertension: Secondary | ICD-10-CM | POA: Diagnosis present

## 2017-12-18 DIAGNOSIS — F0390 Unspecified dementia without behavioral disturbance: Secondary | ICD-10-CM | POA: Diagnosis present

## 2017-12-18 DIAGNOSIS — K219 Gastro-esophageal reflux disease without esophagitis: Secondary | ICD-10-CM | POA: Diagnosis present

## 2017-12-18 DIAGNOSIS — K224 Dyskinesia of esophagus: Secondary | ICD-10-CM | POA: Diagnosis present

## 2017-12-18 DIAGNOSIS — I110 Hypertensive heart disease with heart failure: Secondary | ICD-10-CM | POA: Diagnosis present

## 2017-12-18 DIAGNOSIS — Z23 Encounter for immunization: Secondary | ICD-10-CM

## 2017-12-18 DIAGNOSIS — D638 Anemia in other chronic diseases classified elsewhere: Secondary | ICD-10-CM | POA: Diagnosis present

## 2017-12-18 DIAGNOSIS — E871 Hypo-osmolality and hyponatremia: Secondary | ICD-10-CM | POA: Diagnosis not present

## 2017-12-18 DIAGNOSIS — R0602 Shortness of breath: Secondary | ICD-10-CM

## 2017-12-18 DIAGNOSIS — J9601 Acute respiratory failure with hypoxia: Secondary | ICD-10-CM | POA: Diagnosis present

## 2017-12-18 DIAGNOSIS — E785 Hyperlipidemia, unspecified: Secondary | ICD-10-CM | POA: Diagnosis present

## 2017-12-18 DIAGNOSIS — Z87891 Personal history of nicotine dependence: Secondary | ICD-10-CM

## 2017-12-18 DIAGNOSIS — G8929 Other chronic pain: Secondary | ICD-10-CM | POA: Diagnosis present

## 2017-12-18 DIAGNOSIS — M47816 Spondylosis without myelopathy or radiculopathy, lumbar region: Secondary | ICD-10-CM | POA: Diagnosis present

## 2017-12-18 LAB — COMPREHENSIVE METABOLIC PANEL
ALBUMIN: 4.3 g/dL (ref 3.5–5.0)
ALT: 28 U/L (ref 0–44)
AST: 48 U/L — AB (ref 15–41)
Alkaline Phosphatase: 95 U/L (ref 38–126)
Anion gap: 12 (ref 5–15)
BUN: 19 mg/dL (ref 8–23)
CHLORIDE: 97 mmol/L — AB (ref 98–111)
CO2: 25 mmol/L (ref 22–32)
CREATININE: 0.98 mg/dL (ref 0.44–1.00)
Calcium: 9.1 mg/dL (ref 8.9–10.3)
GFR calc Af Amer: >60 mL/min (ref >60)
GFR, EST NON AFRICAN AMERICAN: 53 mL/min — AB (ref >60)
GLUCOSE: 168 mg/dL — AB (ref 70–99)
Potassium: 4.4 mmol/L (ref 3.5–5.1)
Sodium: 134 mmol/L — ABNORMAL LOW (ref 135–145)
Total Bilirubin: 0.5 mg/dL (ref 0.3–1.2)
Total Protein: 7.2 g/dL (ref 6.5–8.1)

## 2017-12-18 LAB — CBC WITH DIFFERENTIAL/PLATELET
BAND NEUTROPHILS: 9 %
BASOS PCT: 0 %
Basophils Absolute: 0 10*3/uL (ref 0–0.1)
Blasts: 0 %
EOS PCT: 1 %
Eosinophils Absolute: 0.2 10*3/uL (ref 0–0.7)
HEMATOCRIT: 36.2 % (ref 35.0–47.0)
HEMOGLOBIN: 12.3 g/dL (ref 12.0–16.0)
LYMPHS ABS: 0.4 10*3/uL — AB (ref 1.0–3.6)
Lymphocytes Relative: 2 %
MCH: 33.8 pg (ref 26.0–34.0)
MCHC: 33.9 g/dL (ref 32.0–36.0)
MCV: 99.6 fL (ref 80.0–100.0)
METAMYELOCYTES PCT: 2 %
MONO ABS: 0.6 10*3/uL (ref 0.2–0.9)
MONOS PCT: 3 %
MYELOCYTES: 0 %
Neutro Abs: 18.5 10*3/uL — ABNORMAL HIGH (ref 1.4–6.5)
Neutrophils Relative %: 83 %
OTHER: 0 %
Platelets: 342 10*3/uL (ref 150–440)
Promyelocytes Relative: 0 %
RBC: 3.64 MIL/uL — AB (ref 3.80–5.20)
RDW: 14.1 % (ref 11.5–14.5)
Smear Review: ADEQUATE
WBC: 19.7 10*3/uL — AB (ref 3.6–11.0)
nRBC: 0 /100 WBC

## 2017-12-18 LAB — TYPE AND SCREEN
ABO/RH(D): AB POS
Antibody Screen: NEGATIVE

## 2017-12-18 LAB — URINALYSIS, ROUTINE W REFLEX MICROSCOPIC
Bilirubin Urine: NEGATIVE
Glucose, UA: NEGATIVE mg/dL
Hgb urine dipstick: NEGATIVE
Ketones, ur: NEGATIVE mg/dL
Leukocytes, UA: NEGATIVE
NITRITE: NEGATIVE
PH: 5 (ref 5.0–8.0)
Protein, ur: NEGATIVE mg/dL
SPECIFIC GRAVITY, URINE: 1.013 (ref 1.005–1.030)

## 2017-12-18 LAB — TROPONIN I

## 2017-12-18 LAB — LACTIC ACID, PLASMA: LACTIC ACID, VENOUS: 3.5 mmol/L — AB (ref 0.5–1.9)

## 2017-12-18 MED ORDER — VANCOMYCIN HCL IN DEXTROSE 1-5 GM/200ML-% IV SOLN
1000.0000 mg | Freq: Once | INTRAVENOUS | Status: AC
  Administered 2017-12-18: 1000 mg via INTRAVENOUS
  Filled 2017-12-18: qty 200

## 2017-12-18 MED ORDER — SODIUM CHLORIDE 0.9 % IV SOLN
2.0000 g | Freq: Once | INTRAVENOUS | Status: AC
  Administered 2017-12-18: 2 g via INTRAVENOUS
  Filled 2017-12-18: qty 2

## 2017-12-18 MED ORDER — METRONIDAZOLE IN NACL 5-0.79 MG/ML-% IV SOLN
500.0000 mg | Freq: Three times a day (TID) | INTRAVENOUS | Status: DC
  Administered 2017-12-18 – 2017-12-19 (×2): 500 mg via INTRAVENOUS
  Filled 2017-12-18 (×4): qty 100

## 2017-12-18 MED ORDER — SODIUM CHLORIDE 0.9 % IV BOLUS
1000.0000 mL | Freq: Once | INTRAVENOUS | Status: AC
  Administered 2017-12-18: 1000 mL via INTRAVENOUS

## 2017-12-18 NOTE — ED Notes (Addendum)
Pt teeth are at the bedside along with her clothes in a bag

## 2017-12-18 NOTE — ED Notes (Signed)
Date and time results received: 12/18/17 2341 (use smartphrase ".now" to insert current time)  Test: lacic Acid Critical Value: 3.5  Name of Provider Notified: Dr. Lenard LancePaduchowski  Orders Received? Or Actions Taken?

## 2017-12-18 NOTE — Progress Notes (Signed)
CODE SEPSIS - PHARMACY COMMUNICATION  **Broad Spectrum Antibiotics should be administered within 1 hour of Sepsis diagnosis**  Time Code Sepsis Called/Page Received: 2232  Antibiotics Ordered: vanc/cefepime/metronidazole  Time of 1st antibiotic administration: 2252  Additional action taken by pharmacy:   If necessary, Name of Provider/Nurse Contacted:     Thomasene Rippleavid  Newman Waren ,PharmD Clinical Pharmacist  12/18/2017  11:25 PM

## 2017-12-18 NOTE — ED Triage Notes (Signed)
Pt arrived from home via EMS with complaints of difficulty breathing. Pt has Hx of CHF. Pt O2sat for EMS was in the 70s. EMS placed pt on C-pap. EMS stated that pt 12-Lead was unremarkable. VS per EMS BP-190/100 HR-150 O2sat-70s. Pt is alert and oriented x 4. Pt has complaints of abdominal pain. EMS stated that her abdominal area felt "ridgid" and distended. Medication list at bedside.

## 2017-12-18 NOTE — ED Provider Notes (Signed)
[facility] Emergency Department Provider Note  Time seen: 10:36 PM  I have reviewed the triage vital signs and the nursing notes.   HISTORY  Chief Complaint No chief complaint on file.    HPI Jenna Dudley is a 82 y.o. female with a past medical history of CHF, dementia, gastric reflux, depression, hypertension, hyperlipidemia, bipolar, presents the emergency department for reported difficulty breathing.  According to EMS they arrived at the patient's home to find her having difficulty breathing, room air saturation in the 70s initially, placed on CPAP and brought to the emergency department.  Here upon arrival patient is awake and alert she appears to be confused, when asked what year it is she keeps saying 1700.  Was able to tell me she is at the hospital in [place].  Does appear mildly short of breath will continue on BiPAP currently satting in the mid 90s on BiPAP.  She is quite tachypneic in the 30s she is tachycardic around 150 and found to be febrile to 104 initially, on repeat 102.7.  Patient is not able to provide any history or any review of systems for [place] likely due to confusion.   Past Medical History:  Diagnosis Date  . Bilateral carpal tunnel syndrome 07/27/14  . Bipolar disorder [facility])    geropsych admission to [facility] in Dec 2018  . Chronic diastolic CHF (congestive heart failure) (HCC)    a. 03/2017 Echo: EF 55-60%, no rwma, Gr1 DD, mild MR.  . Chronic low back pain 07/29/14  . Degenerative arthritis of lumbar spine 07/27/14  . Dementia   . Depression   . Esophageal spasm 07/29/14  . GERD (gastroesophageal reflux disease)   . Hyperlipemia 07/29/14  . Hypertension   . Spinal stenosis 07/29/14    Patient Active Problem List   Diagnosis Date Noted  . Bipolar 1 disorder (HCC) 05/29/2017  . CHF (congestive heart failure) (HCC) 04/23/2017  . Accelerated hypertension 04/21/2017  . Hypernatremia 10/03/2015  . Lactic acidosis 10/03/2015  .  Leukocytosis 10/03/2015  . Agitation 10/03/2015  . Severe recurrent major depression without psychotic features (HCC)   . Major depressive disorder, recurrent episode, severe, with psychotic behavior (HCC) 10/13/2014  . Severe episode of recurrent major depressive disorder, with psychotic features (HCC) 10/13/2014  . 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 Essential hypertension 09/17/2014  . Chronic back pain 09/17/2014  . Gastric reflux 09/17/2014  . History of stroke 09/17/2014  . Severe recurrent major depression with psychotic features (HCC)   . Hallux abductovalgus with bunions 09/12/2013  . Hammer toe 09/12/2013  . Foot pain 09/12/2013  . Fungal infection of nail 09/12/2013  . Carpal tunnel syndrome 12/21/2010  . Chronic LBP 12/21/2010  . Degenerative arthritis of lumbar spine 12/21/2010  . Clinical depression 12/21/2010  . 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 HLD (hyperlipidemia) 12/21/2010  . Acid reflux 12/21/2010  . Spinal stenosis 12/21/2010    Past Surgical History:  Procedure Laterality Date  . HIP SURGERY Right   . INNER EAR SURGERY Right   . REPLACEMENT TOTAL KNEE BILATERAL Bilateral 07/27/14    Prior to Admission medications   Medication Sig Start Date End Date Taking? Authorizing Provider  acetaminophen-codeine (TYLENOL #3) 300-30 MG per tablet Take 1 tablet by mouth every 8 (eight) hours as needed for moderate pain or severe pain.    [provider]  cholecalciferol (VITAMIN D) 1000 units tablet Take 1,000 Units by mouth daily.  [provider]  ciprofloxacin (CIPRO) 250 MG tablet Take 250 mg by mouth 2 (two) times daily.    [provider]  ferrous sulfate 325 (65 FE) MG EC tablet Take 325 mg by mouth daily with breakfast.    [provider]  furosemide (LASIX) 20 MG tablet TAKE 1 TABLET BY MOUTH ONCE DAILY 09/28/17   Iran Ouch, MD  losartan (COZAAR) 25 MG tablet  Take 1 tablet (25 mg total) by mouth daily. 09/26/14   Pucilowska, Jolanta B, MD  magnesium oxide (MAG-OX) 400 MG tablet Take 400 mg by mouth daily.    [provider]  metoprolol tartrate (LOPRESSOR) 50 MG tablet Take 1 tablet (50 mg total) by mouth 2 (two) times daily. 05/01/17 07/30/17  Creig Hines, NP  mirtazapine (REMERON) 45 MG tablet Take 1 tablet (45 mg total) by mouth at bedtime. 10/03/17   Clapacs, Jackquline Denmark, MD  OLANZapine (ZYPREXA) 10 MG tablet TAKE 1 TABLET BY MOUTH AT BEDTIME Patient not taking: Reported on 12/10/2017 10/03/17   Clapacs, Jackquline Denmark, MD  OLANZapine (ZYPREXA) 20 MG tablet Take 1 tablet (20 mg total) by mouth at bedtime. 11/28/17   Clapacs, Jackquline Denmark, MD  omeprazole (PRILOSEC) 20 MG capsule Take 20 mg by mouth 2 (two) times daily before a meal.     [provider]  simvastatin (ZOCOR) 40 MG tablet Take 40 mg by mouth at bedtime.     [provider]  venlafaxine XR (EFFEXOR-XR) 150 MG 24 hr capsule TAKE 1 CAPSULE BY MOUTH DAILY WITH BREAKFAST 10/03/17   Clapacs, Jackquline Denmark, MD    Allergies  Allergen Reactions  . Aspirin Other (See Comments)    Unknown reaction  . Sulfa Antibiotics Other (See Comments)    Unknown reaction    Family History  Problem Relation Age of Onset  . Hypertension Mother   . Stroke Mother     Social History Social History   Tobacco Use  . Smoking status: Former Smoker    Types: Cigarettes    Last attempt to quit: 07/29/1975    Years since quitting: 42.4  . Smokeless tobacco: Never Used  Substance Use Topics  . Alcohol use: No    Alcohol/week: 0.0 standard drinks  . Drug use: No    Review of Systems Unable to obtain an adequate/accurate review of systems secondary to confusion.  ____________________________________________   PHYSICAL EXAM:  VITAL SIGNS: ED Triage Vitals  Enc Vitals Group     BP 12/18/17 2227 (!) 182/79     Pulse Rate 12/18/17 2227 (!) 148     Resp 12/18/17 2227 (!) 38     Temp 12/18/17  2233 (!) 104 F (40 C)     Temp Source 12/18/17 2230 Axillary     SpO2 12/18/17 2227 93 %     Weight 12/18/17 2233 140 lb (63.5 kg)     Height 12/18/17 2233 5' (1.524 m)     Head Circumference --      Peak Flow --      Pain Score --      Pain Loc --      Pain Edu? --      Excl. in GC? --    Constitutional: She is awake and alert she is oriented to place and person but disoriented to time.  Unable to provide an accurate history or reason why she is here. Eyes: Normal exam ENT   Head: Normocephalic and atraumatic.   Mouth/Throat: Moist mucous  membranes, dentures were falling out so we took them out in the emergency department. Cardiovascular: Regular rhythm, rate around 150. Respiratory: Significant tachypnea in the mid 30s.  Fairly clear lung sounds bilaterally auscultation limited due to BiPAP/CPAP usage. Gastrointestinal: Soft, no significant distention, no reaction to abdominal palpation. Musculoskeletal: Nontender with normal range of motion in all extremities.  Neurologic: Patient is able to speak, however she appears confused and gives inappropriate answers at times.  Able to move all extremities. Skin:  Skin is warm, dry and intact.     RADIOLOGY  Asymmetric edema  ____________________________________________   INITIAL IMPRESSION / ASSESSMENT AND PLAN / ED COURSE  Pertinent labs & imaging results that were available during my care of the patient were reviewed by me and considered in my medical decision making (see chart for details).  Patient presents to the emergency department via EMS called out for shortness of breath.  Patient found to be hypoxic initially per EMS placed on CPAP and brought to the emergency department.  On arrival patient transition to BiPAP found to be febrile tachycardic tachypneic.  Satting in the mid 90s on BiPAP.  Differential is quite broad but would meet sepsis criteria, suspect likely underlying pneumonia given patient is febrile and  hypoxic but we will perform a full work-up including blood work urinalysis lactic acid.  We will start on broad-spectrum antibiotics while awaiting results.  work-up shows a lactate of 3, with leukocytosis.  X-ray shows asymmetric edema.  Patient admitted to the hospitalist service for continued treatment   CRITICAL CARE Performed by: Minna AntisKevin Azlaan Isidore   Total critical care time: 30 minutes  Critical care time was exclusive of separately billable procedures and treating other patients.  Critical care was necessary to treat or prevent imminent or life-threatening deterioration.  Critical care was time spent personally by me on the following activities: development of treatment plan with patient and/or surrogate as well as nursing, discussions with consultants, evaluation of patient's response to treatment, examination of patient, obtaining history from patient or surrogate, ordering and performing treatments and interventions, ordering and review of laboratory studies, ordering and review of radiographic studies, pulse oximetry and re-evaluation of patient's condition.   ____________________________________________   FINAL CLINICAL IMPRESSION(S) / ED DIAGNOSES  Sepsis Pulmonary edema   Minna AntisPaduchowski, Ava Deguire, MD 12/19/17 [phone]

## 2017-12-18 NOTE — Progress Notes (Signed)
   12/18/17 2225  Clinical Encounter Type  Visited With Patient and family together  Visit Type Initial;Spiritual support;ED  Referral From Nurse  Consult/Referral To Chaplain  Spiritual Encounters  Spiritual Needs Prayer;Emotional   CH received a page for emergency traffic coming to the ER. I presented myself to the ER and was directed to ED04. The medical team was actively caring for the patient. I was told by the patient's RN that the patient's husband was in the waiting area. Going to the waiting area I found Mr. Ezzard StandingLunney and told him that the team was still getting his wife settled and once she was someone would come and get him. I went back to ED04 and prayed for the patient silently. Ms. Enos FlingLunney's husband was brought back to her room by a Charity fundraiserN. I will follow up if needed.

## 2017-12-19 DIAGNOSIS — Z8673 Personal history of transient ischemic attack (TIA), and cerebral infarction without residual deficits: Secondary | ICD-10-CM | POA: Diagnosis not present

## 2017-12-19 DIAGNOSIS — J9601 Acute respiratory failure with hypoxia: Secondary | ICD-10-CM | POA: Diagnosis present

## 2017-12-19 DIAGNOSIS — Z87891 Personal history of nicotine dependence: Secondary | ICD-10-CM | POA: Diagnosis not present

## 2017-12-19 DIAGNOSIS — Z23 Encounter for immunization: Secondary | ICD-10-CM | POA: Diagnosis present

## 2017-12-19 DIAGNOSIS — Z882 Allergy status to sulfonamides status: Secondary | ICD-10-CM | POA: Diagnosis not present

## 2017-12-19 DIAGNOSIS — A419 Sepsis, unspecified organism: Secondary | ICD-10-CM | POA: Diagnosis present

## 2017-12-19 DIAGNOSIS — J181 Lobar pneumonia, unspecified organism: Secondary | ICD-10-CM

## 2017-12-19 DIAGNOSIS — M47816 Spondylosis without myelopathy or radiculopathy, lumbar region: Secondary | ICD-10-CM | POA: Diagnosis present

## 2017-12-19 DIAGNOSIS — I499 Cardiac arrhythmia, unspecified: Secondary | ICD-10-CM | POA: Diagnosis not present

## 2017-12-19 DIAGNOSIS — Z79899 Other long term (current) drug therapy: Secondary | ICD-10-CM | POA: Diagnosis not present

## 2017-12-19 DIAGNOSIS — R0602 Shortness of breath: Secondary | ICD-10-CM | POA: Diagnosis not present

## 2017-12-19 DIAGNOSIS — Z823 Family history of stroke: Secondary | ICD-10-CM | POA: Diagnosis not present

## 2017-12-19 DIAGNOSIS — M545 Low back pain: Secondary | ICD-10-CM | POA: Diagnosis present

## 2017-12-19 DIAGNOSIS — I5032 Chronic diastolic (congestive) heart failure: Secondary | ICD-10-CM | POA: Diagnosis present

## 2017-12-19 DIAGNOSIS — F319 Bipolar disorder, unspecified: Secondary | ICD-10-CM | POA: Diagnosis present

## 2017-12-19 DIAGNOSIS — E785 Hyperlipidemia, unspecified: Secondary | ICD-10-CM | POA: Diagnosis present

## 2017-12-19 DIAGNOSIS — I10 Essential (primary) hypertension: Secondary | ICD-10-CM

## 2017-12-19 DIAGNOSIS — Z886 Allergy status to analgesic agent status: Secondary | ICD-10-CM | POA: Diagnosis not present

## 2017-12-19 DIAGNOSIS — G9340 Encephalopathy, unspecified: Secondary | ICD-10-CM | POA: Diagnosis not present

## 2017-12-19 DIAGNOSIS — Y95 Nosocomial condition: Secondary | ICD-10-CM | POA: Diagnosis not present

## 2017-12-19 DIAGNOSIS — K219 Gastro-esophageal reflux disease without esophagitis: Secondary | ICD-10-CM | POA: Diagnosis present

## 2017-12-19 DIAGNOSIS — F0390 Unspecified dementia without behavioral disturbance: Secondary | ICD-10-CM | POA: Diagnosis present

## 2017-12-19 DIAGNOSIS — Z96653 Presence of artificial knee joint, bilateral: Secondary | ICD-10-CM | POA: Diagnosis present

## 2017-12-19 DIAGNOSIS — E872 Acidosis: Secondary | ICD-10-CM | POA: Diagnosis present

## 2017-12-19 DIAGNOSIS — I110 Hypertensive heart disease with heart failure: Secondary | ICD-10-CM | POA: Diagnosis present

## 2017-12-19 DIAGNOSIS — E871 Hypo-osmolality and hyponatremia: Secondary | ICD-10-CM | POA: Diagnosis not present

## 2017-12-19 DIAGNOSIS — J189 Pneumonia, unspecified organism: Secondary | ICD-10-CM | POA: Diagnosis present

## 2017-12-19 DIAGNOSIS — G8929 Other chronic pain: Secondary | ICD-10-CM | POA: Diagnosis present

## 2017-12-19 DIAGNOSIS — Z8249 Family history of ischemic heart disease and other diseases of the circulatory system: Secondary | ICD-10-CM | POA: Diagnosis not present

## 2017-12-19 DIAGNOSIS — R6520 Severe sepsis without septic shock: Secondary | ICD-10-CM | POA: Diagnosis not present

## 2017-12-19 LAB — BLOOD CULTURE ID PANEL (REFLEXED)
ACINETOBACTER BAUMANNII: NOT DETECTED
CANDIDA ALBICANS: NOT DETECTED
CANDIDA GLABRATA: NOT DETECTED
CANDIDA KRUSEI: NOT DETECTED
CANDIDA PARAPSILOSIS: NOT DETECTED
Candida tropicalis: NOT DETECTED
ENTEROBACTERIACEAE SPECIES: NOT DETECTED
ESCHERICHIA COLI: NOT DETECTED
Enterobacter cloacae complex: NOT DETECTED
Enterococcus species: NOT DETECTED
Haemophilus influenzae: NOT DETECTED
KLEBSIELLA OXYTOCA: NOT DETECTED
Klebsiella pneumoniae: NOT DETECTED
Listeria monocytogenes: NOT DETECTED
Methicillin resistance: DETECTED — AB
Neisseria meningitidis: NOT DETECTED
PSEUDOMONAS AERUGINOSA: NOT DETECTED
Proteus species: NOT DETECTED
STREPTOCOCCUS PNEUMONIAE: NOT DETECTED
STREPTOCOCCUS PYOGENES: NOT DETECTED
Serratia marcescens: NOT DETECTED
Staphylococcus aureus (BCID): NOT DETECTED
Staphylococcus species: DETECTED — AB
Streptococcus agalactiae: NOT DETECTED
Streptococcus species: NOT DETECTED

## 2017-12-19 LAB — CBC
HCT: 29 % — ABNORMAL LOW (ref 35.0–47.0)
Hemoglobin: 9.9 g/dL — ABNORMAL LOW (ref 12.0–16.0)
MCH: 34.1 pg — ABNORMAL HIGH (ref 26.0–34.0)
MCHC: 34.3 g/dL (ref 32.0–36.0)
MCV: 99.6 fL (ref 80.0–100.0)
PLATELETS: 205 10*3/uL (ref 150–440)
RBC: 2.91 MIL/uL — ABNORMAL LOW (ref 3.80–5.20)
RDW: 13.7 % (ref 11.5–14.5)
WBC: 22.3 10*3/uL — AB (ref 3.6–11.0)

## 2017-12-19 LAB — PROCALCITONIN: Procalcitonin: 23.78 ng/mL

## 2017-12-19 LAB — GLUCOSE, CAPILLARY
GLUCOSE-CAPILLARY: 116 mg/dL — AB (ref 70–99)
GLUCOSE-CAPILLARY: 122 mg/dL — AB (ref 70–99)
Glucose-Capillary: 106 mg/dL — ABNORMAL HIGH (ref 70–99)
Glucose-Capillary: 104 mg/dL — ABNORMAL HIGH (ref 70–99)
Glucose-Capillary: 132 mg/dL — ABNORMAL HIGH (ref 70–99)
Glucose-Capillary: 101 mg/dL — ABNORMAL HIGH (ref 70–99)

## 2017-12-19 LAB — BASIC METABOLIC PANEL
Anion gap: 11 (ref 5–15)
BUN: 23 mg/dL (ref 8–23)
CALCIUM: 8.5 mg/dL — AB (ref 8.9–10.3)
CHLORIDE: 101 mmol/L (ref 98–111)
CO2: 22 mmol/L (ref 22–32)
CREATININE: 1.24 mg/dL — AB (ref 0.44–1.00)
GFR calc non Af Amer: 40 mL/min — ABNORMAL LOW (ref >60)
GFR, EST AFRICAN AMERICAN: 46 mL/min — AB (ref >60)
Glucose, Bld: 114 mg/dL — ABNORMAL HIGH (ref 70–99)
Potassium: 4.1 mmol/L (ref 3.5–5.1)
SODIUM: 134 mmol/L — AB (ref 135–145)

## 2017-12-19 LAB — LACTIC ACID, PLASMA
Lactic Acid, Venous: 3.1 mmol/L (ref 0.5–1.9)
Lactic Acid, Venous: 2.3 mmol/L (ref 0.5–1.9)
Lactic Acid, Venous: 3.4 mmol/L (ref 0.5–1.9)

## 2017-12-19 LAB — MRSA PCR SCREENING: MRSA by PCR: NEGATIVE

## 2017-12-19 MED ORDER — PANTOPRAZOLE SODIUM 20 MG PO TBEC
20.0000 mg | DELAYED_RELEASE_TABLET | Freq: Two times a day (BID) | ORAL | Status: DC
  Administered 2017-12-19 (×2): 20 mg via ORAL
  Filled 2017-12-19 (×4): qty 1

## 2017-12-19 MED ORDER — IPRATROPIUM-ALBUTEROL 0.5-2.5 (3) MG/3ML IN SOLN
3.0000 mL | Freq: Four times a day (QID) | RESPIRATORY_TRACT | Status: DC
  Administered 2017-12-19 – 2017-12-21 (×8): 3 mL via RESPIRATORY_TRACT
  Filled 2017-12-19 (×8): qty 3

## 2017-12-19 MED ORDER — SODIUM CHLORIDE 0.9 % IV SOLN: INTRAVENOUS | Status: AC

## 2017-12-19 MED ORDER — SODIUM CHLORIDE 0.9 % IV SOLN
1.0000 g | Freq: Every day | INTRAVENOUS | Status: DC
  Administered 2017-12-19 – 2017-12-24 (×6): 1 g via INTRAVENOUS
  Filled 2017-12-19 (×2): qty 1
  Filled 2017-12-19: qty 10
  Filled 2017-12-19 (×4): qty 1

## 2017-12-19 MED ORDER — VANCOMYCIN HCL IN DEXTROSE 750-5 MG/150ML-% IV SOLN
750.0000 mg | INTRAVENOUS | Status: DC
  Administered 2017-12-19: 750 mg via INTRAVENOUS
  Filled 2017-12-19: qty 150

## 2017-12-19 MED ORDER — SIMVASTATIN 20 MG PO TABS
40.0000 mg | ORAL_TABLET | Freq: Every day | ORAL | Status: DC
  Administered 2017-12-19 – 2017-12-24 (×6): 40 mg via ORAL
  Filled 2017-12-19 (×6): qty 2

## 2017-12-19 MED ORDER — VENLAFAXINE HCL ER 75 MG PO CP24
150.0000 mg | ORAL_CAPSULE | Freq: Every day | ORAL | Status: DC
  Administered 2017-12-19 – 2017-12-25 (×6): 150 mg via ORAL
  Filled 2017-12-19 (×5): qty 2
  Filled 2017-12-19 (×2): qty 1
  Filled 2017-12-19: qty 2
  Filled 2017-12-19: qty 1

## 2017-12-19 MED ORDER — FERROUS SULFATE 325 (65 FE) MG PO TABS
325.0000 mg | ORAL_TABLET | Freq: Every day | ORAL | Status: DC
  Administered 2017-12-21 – 2017-12-25 (×5): 325 mg via ORAL
  Filled 2017-12-19 (×7): qty 1

## 2017-12-19 MED ORDER — VANCOMYCIN HCL IN DEXTROSE 1-5 GM/200ML-% IV SOLN
1000.0000 mg | Freq: Once | INTRAVENOUS | Status: AC
  Administered 2017-12-19: 1000 mg via INTRAVENOUS
  Filled 2017-12-19: qty 200

## 2017-12-19 MED ORDER — SODIUM CHLORIDE 0.9 % IV SOLN
500.0000 mg | Freq: Every day | INTRAVENOUS | Status: DC
  Administered 2017-12-19: 500 mg via INTRAVENOUS
  Filled 2017-12-19 (×2): qty 500

## 2017-12-19 MED ORDER — PNEUMOCOCCAL VAC POLYVALENT 25 MCG/0.5ML IJ INJ
0.5000 mL | INJECTION | INTRAMUSCULAR | Status: AC
  Administered 2017-12-21: 0.5 mL via INTRAMUSCULAR
  Filled 2017-12-19: qty 0.5

## 2017-12-19 MED ORDER — ACETAMINOPHEN 650 MG RE SUPP
650.0000 mg | Freq: Four times a day (QID) | RECTAL | Status: DC | PRN
  Administered 2017-12-20: 650 mg via RECTAL
  Filled 2017-12-19: qty 1

## 2017-12-19 MED ORDER — OLANZAPINE 10 MG PO TABS
20.0000 mg | ORAL_TABLET | Freq: Every day | ORAL | Status: DC
  Administered 2017-12-19 – 2017-12-24 (×6): 20 mg via ORAL
  Filled 2017-12-19 (×7): qty 2

## 2017-12-19 MED ORDER — ENOXAPARIN SODIUM 40 MG/0.4ML ~~LOC~~ SOLN
40.0000 mg | SUBCUTANEOUS | Status: DC
  Administered 2017-12-19 – 2017-12-20 (×2): 40 mg via SUBCUTANEOUS
  Filled 2017-12-19 (×2): qty 0.4

## 2017-12-19 MED ORDER — SODIUM CHLORIDE 0.9 % IV SOLN
INTRAVENOUS | Status: AC
  Administered 2017-12-19: 150 mL/h via INTRAVENOUS

## 2017-12-19 MED ORDER — ACETAMINOPHEN 325 MG PO TABS
650.0000 mg | ORAL_TABLET | Freq: Four times a day (QID) | ORAL | Status: DC | PRN
  Administered 2017-12-19 – 2017-12-23 (×5): 650 mg via ORAL
  Filled 2017-12-19 (×5): qty 2

## 2017-12-19 MED ORDER — INSULIN ASPART 100 UNIT/ML ~~LOC~~ SOLN
0.0000 [IU] | SUBCUTANEOUS | Status: DC
  Administered 2017-12-19: 2 [IU] via SUBCUTANEOUS
  Filled 2017-12-19: qty 1

## 2017-12-19 MED ORDER — LOSARTAN POTASSIUM 25 MG PO TABS
25.0000 mg | ORAL_TABLET | Freq: Every day | ORAL | Status: DC
  Administered 2017-12-19: 25 mg via ORAL
  Filled 2017-12-19: qty 1

## 2017-12-19 MED ORDER — LOSARTAN POTASSIUM 25 MG PO TABS: 25.0000 mg | ORAL_TABLET | Freq: Every day | ORAL | Status: DC

## 2017-12-19 MED ORDER — INFLUENZA VAC SPLIT HIGH-DOSE 0.5 ML IM SUSY
0.5000 mL | PREFILLED_SYRINGE | INTRAMUSCULAR | Status: AC
  Administered 2017-12-23: 15:00:00 0.5 mL via INTRAMUSCULAR
  Filled 2017-12-19 (×2): qty 0.5

## 2017-12-19 MED ORDER — ONDANSETRON HCL 4 MG/2ML IJ SOLN: 4.0000 mg | Freq: Four times a day (QID) | INTRAMUSCULAR | Status: DC | PRN

## 2017-12-19 MED ORDER — VITAMIN D 1000 UNITS PO TABS
1000.0000 [IU] | ORAL_TABLET | Freq: Every day | ORAL | Status: DC
  Administered 2017-12-19 – 2017-12-25 (×6): 1000 [IU] via ORAL
  Filled 2017-12-19 (×7): qty 1

## 2017-12-19 MED ORDER — SODIUM CHLORIDE 0.9 % IV SOLN
1.0000 g | Freq: Two times a day (BID) | INTRAVENOUS | Status: DC
  Filled 2017-12-19 (×2): qty 1

## 2017-12-19 MED ORDER — MIRTAZAPINE 15 MG PO TABS
45.0000 mg | ORAL_TABLET | Freq: Every day | ORAL | Status: DC
  Administered 2017-12-19 – 2017-12-24 (×6): 45 mg via ORAL
  Filled 2017-12-19 (×6): qty 3

## 2017-12-19 MED ORDER — ONDANSETRON HCL 4 MG PO TABS: 4.0000 mg | ORAL_TABLET | Freq: Four times a day (QID) | ORAL | Status: DC | PRN

## 2017-12-19 MED ORDER — VANCOMYCIN HCL IN DEXTROSE 750-5 MG/150ML-% IV SOLN
750.0000 mg | INTRAVENOUS | Status: DC
  Administered 2017-12-20: 750 mg via INTRAVENOUS
  Filled 2017-12-19 (×3): qty 150

## 2017-12-19 NOTE — Progress Notes (Signed)
Pharmacy Antibiotic Note  Jenna Dudley is a 82 y.o. female admitted on 12/18/2017 with pneumonia.  Pharmacy has been consulted for vanc/cefepime dosing. Patient received vanc 1g, cefepime 2g, flagyl 500 mg IV x 1  Plan: Will continue vanc 750 mg IV q24h w/ 8 hour stack  Will draw vanc trough 09/28 @ 0700 prior to 4th dose. Will continue cefepime 1g IV q12h  Ke 0.0360 T1/2 19 ~ 24 hrs Goal trough 15 - 20 mcg/mL  Height: 5' (152.4 cm) Weight: 140 lb (63.5 kg) IBW/kg (Calculated) : 45.5  Temp (24hrs), Avg:104 F (40 C), Min:104 F (40 C), Max:104 F (40 C)  Recent Labs  Lab 12/18/17 2230  WBC 19.7*  CREATININE 0.98  LATICACIDVEN 3.5*    Estimated Creatinine Clearance: 38.1 mL/min (by C-G formula based on SCr of 0.98 mg/dL).    Allergies  Allergen Reactions  . Aspirin Other (See Comments)    Unknown reaction  . Sulfa Antibiotics Other (See Comments)    Unknown reaction    Thank you for allowing pharmacy to be a part of this patient's care.  Thomasene Ripple, PharmD, BCPS Clinical Pharmacist 12/19/2017

## 2017-12-19 NOTE — H&P (Signed)
[place] [facility] NAME: Jenna Dudley    MR#:  [id number]  DATE OF BIRTH:  10-Jan-1936  DATE OF ADMISSION:  12/18/2017  PRIMARY CARE PHYSICIAN: Aderoju, Jadene Pierini, MD   REQUESTING/REFERRING PHYSICIAN: Kerman Passey, MD  CHIEF COMPLAINT:  Short of breath  HISTORY OF PRESENT ILLNESS:  Jenna Dudley  is a 82 y.o. female who presents with chief complaint as above.  Patient is confused and unable to contribute much to her history.  Family member bedside provides information for HPI.  States that she began feeling bad on the day of presentation in the ED.  Complaint was shortness of breath.  Here in the ED she is found to have right-sided pneumonia and met sepsis criteria.  Hospitalist were called for admission  PAST MEDICAL HISTORY:   Past Medical History:  Diagnosis Date  . Bilateral carpal tunnel syndrome 07/27/14  . Bipolar disorder [facility])    geropsych admission to [facility] in Dec 2018  . Chronic diastolic CHF (congestive heart failure) ([place])    a. 03/2017 Echo: EF 55-60%, no rwma, Gr1 DD, mild MR.  . Chronic low back pain 07/29/14  . Degenerative arthritis of lumbar spine 07/27/14  . Dementia   . Depression   . Esophageal spasm 07/29/14  . GERD (gastroesophageal reflux disease)   . Hyperlipemia 07/29/14  . Hypertension   . Spinal stenosis 07/29/14     PAST SURGICAL HISTORY:   Past Surgical History:  Procedure Laterality Date  . HIP SURGERY Right   . INNER EAR SURGERY Right   . REPLACEMENT TOTAL KNEE BILATERAL Bilateral 07/27/14     SOCIAL HISTORY:   Social History   Tobacco Use  . Smoking status: Former Smoker    Types: Cigarettes    Last attempt to quit: 07/29/1975    Years since quitting: 42.4  . Smokeless tobacco: Never Used  Substance Use Topics  . Alcohol use: No    Alcohol/week: 0.0 standard drinks     FAMILY HISTORY:   Family History  Problem Relation Age of Onset  . Hypertension Mother   . Stroke  Mother      DRUG ALLERGIES:   Allergies  Allergen Reactions  . Aspirin Other (See Comments)    Unknown reaction  . Sulfa Antibiotics Other (See Comments)    Unknown reaction    MEDICATIONS AT HOME:   Prior to Admission medications   Medication Sig Start Date End Date Taking? Authorizing Provider  acetaminophen-codeine (TYLENOL #3) 300-30 MG per tablet Take 1 tablet by mouth every 8 (eight) hours as needed for moderate pain or severe pain.    [provider]  cholecalciferol (VITAMIN D) 1000 units tablet Take 1,000 Units by mouth daily.    [provider]  ciprofloxacin (CIPRO) 250 MG tablet Take 250 mg by mouth 2 (two) times daily.    [provider]  ferrous sulfate 325 (65 FE) MG EC tablet Take 325 mg by mouth daily with breakfast.    [provider]  furosemide (LASIX) 20 MG tablet TAKE 1 TABLET BY MOUTH ONCE DAILY 09/28/17   Wellington Hampshire, MD  losartan (COZAAR) 25 MG tablet Take 1 tablet (25 mg total) by mouth daily. 09/26/14   Pucilowska, Jolanta B, MD  magnesium oxide (MAG-OX) 400 MG tablet Take 400 mg by mouth daily.    [provider]  metoprolol tartrate (LOPRESSOR) 50 MG tablet Take 1 tablet (50 mg total) by mouth 2 (two) times  daily. 05/01/17 07/30/17  Theora Gianotti, NP  mirtazapine (REMERON) 45 MG tablet Take 1 tablet (45 mg total) by mouth at bedtime. 10/03/17   Clapacs, Madie Reno, MD  OLANZapine (ZYPREXA) 10 MG tablet TAKE 1 TABLET BY MOUTH AT BEDTIME Patient not taking: Reported on 12/10/2017 10/03/17   Clapacs, Madie Reno, MD  OLANZapine (ZYPREXA) 20 MG tablet Take 1 tablet (20 mg total) by mouth at bedtime. 11/28/17   Clapacs, Madie Reno, MD  omeprazole (PRILOSEC) 20 MG capsule Take 20 mg by mouth 2 (two) times daily before a meal.     [provider]  simvastatin (ZOCOR) 40 MG tablet Take 40 mg by mouth at bedtime.     [provider]  venlafaxine XR (EFFEXOR-XR) 150 MG 24 hr capsule TAKE 1 CAPSULE BY MOUTH DAILY  WITH BREAKFAST 10/03/17   Clapacs, Madie Reno, MD    REVIEW OF SYSTEMS:  Review of Systems  Unable to perform ROS: Acuity of condition     VITAL SIGNS:   Vitals:   12/18/17 2227 12/18/17 2230 12/18/17 2233 12/18/17 2300  BP: (!) 182/79 (!) 191/138  (!) 140/118  Pulse: (!) 148 (!) 146  (!) 154  Resp: (!) 38 (!) 27  (!) 45  Temp:   (!) 104 F (40 C)   TempSrc:  Axillary Axillary   SpO2: 93% 96%  97%  Weight:   63.5 kg   Height:   5' (1.524 m)    Wt Readings from Last 3 Encounters:  12/18/17 63.5 kg  12/10/17 64.3 kg  12/03/17 66.7 kg    PHYSICAL EXAMINATION:  Physical Exam  Vitals reviewed. Constitutional: She is oriented to person, place, and time. She appears well-developed and well-nourished. No distress.  HENT:  Head: Normocephalic and atraumatic.  Mouth/Throat: Oropharynx is clear and moist.  Eyes: Pupils are equal, round, and reactive to light. Conjunctivae and EOM are normal. No scleral icterus.  Neck: Normal range of motion. Neck supple. No JVD present. No thyromegaly present.  Cardiovascular: Regular rhythm and intact distal pulses. Exam reveals no gallop and no friction rub.  No murmur heard. Tachycardic  Respiratory: She is in respiratory distress. She has no wheezes. She has no rales.  Right-sided rhonchi  GI: Soft. Bowel sounds are normal. She exhibits no distension. There is no tenderness.  Musculoskeletal: Normal range of motion. She exhibits no edema.  No arthritis, no gout  Lymphadenopathy:    She has no cervical adenopathy.  Neurological: She is alert and oriented to person, place, and time. No cranial nerve deficit.  No dysarthria, no aphasia  Skin: Skin is warm and dry. No rash noted. No erythema.  Psychiatric: She has a normal mood and affect. Her behavior is normal. Judgment and thought content normal.    LABORATORY PANEL:   CBC Recent Labs  Lab 12/18/17 2230  WBC 19.7*  HGB 12.3  HCT 36.2  PLT 342    ------------------------------------------------------------------------------------------------------------------  Chemistries  Recent Labs  Lab 12/18/17 2230  NA 134*  K 4.4  CL 97*  CO2 25  GLUCOSE 168*  BUN 19  CREATININE 0.98  CALCIUM 9.1  AST 48*  ALT 28  ALKPHOS 95  BILITOT 0.5   ------------------------------------------------------------------------------------------------------------------  Cardiac Enzymes Recent Labs  Lab 12/18/17 2230  TROPONINI <0.03   ------------------------------------------------------------------------------------------------------------------  RADIOLOGY:  Dg Chest Port 1 View  Result Date: 12/18/2017 CLINICAL DATA:  82 year old female with fever and sepsis. EXAM: PORTABLE CHEST 1 VIEW COMPARISON:  Chest radiograph dated 04/20/2017 FINDINGS:  Patchy area of density in the right perihilar region is new compared to the prior radiograph and concerning for pneumonia. Asymmetric edema is not excluded. Clinical correlation is recommended. The right apex is not well evaluated due to superimposition of the patient's jaw. No large pleural effusion. No pneumothorax. The cardiac silhouette is within normal limits. Atherosclerotic calcification of the aortic arch. No acute osseous pathology. IMPRESSION: Right perihilar infiltrate versus asymmetric edema. Clinical correlation is recommended. Electronically Signed   By: Anner Crete M.D.   On: 12/18/2017 23:19    EKG:   Orders placed or performed during the hospital encounter of 12/18/17  . ED EKG 12-Lead  . ED EKG 12-Lead    IMPRESSION AND PLAN:  Principal Problem:   Severe sepsis (HCC) -IV antibiotics initiated, lactic acid elevated, IV fluids in place and trend lactic until within normal limits.  Blood pressure stable. cultures sent Active Problems:   HCAP (healthcare-associated pneumonia) -IV antibiotics, patient is currently on BiPAP continue this for now, supportive treatment PRN    Essential hypertension -stable, continue home meds   Bipolar 1 disorder ([place]) -continue home medications   HLD (hyperlipidemia) -Home dose antilipid   Acid reflux -Home dose PPI  Chart review performed and case discussed with ED provider. Labs, imaging and/or ECG reviewed by provider and discussed with patient/family. Management plans discussed with the patient and/or family.  DVT PROPHYLAXIS: SubQ lovenox   GI PROPHYLAXIS:  PPI   ADMISSION STATUS: Inpatient     CODE STATUS: Full Code Status History    Date Active Date Inactive Code Status Order ID Comments User Context   04/21/2017 0921 04/23/2017 1430 Full Code [id number]  Lance Coon, MD Inpatient   10/03/2015 1340 10/15/2015 2006 Full Code [id number]  Theodoro Grist, MD Inpatient   09/17/2014 0301 09/26/2014 1929 Full Code [id number]  Clapacs, Madie Reno, MD Inpatient      TOTAL TIME TAKING CARE OF THIS PATIENT: 45 minutes.   Kamille Toomey [place] 12/19/2017, 12:35 AM  [facility]  Office  [phone]  CC: Primary care physician; Aderoju, Jadene Pierini, MD  Note:  This document was prepared using Dragon voice recognition software and may include unintentional dictation errors.

## 2017-12-19 NOTE — Progress Notes (Addendum)
Pharmacy Antibiotic Note  Jenna Dudley is a 82 y.o. female admitted on 12/18/2017 with pneumonia. Patient presents from home. Pharmacy consulted for vancomycin and cefepime dosing. Patient also receiving metronidazole.  Plan: Per CCM on ICU rounds 9/25, will transition to ceftriaxone and azithromycin  Will discontinue vancomycin, cefepime and metronidazole Start ceftriaxone 1 g IV q24h and azithromycin 500 mg IV q24h  Height: 5' (152.4 cm) Weight: 140 lb (63.5 kg) IBW/kg (Calculated) : 45.5  Temp (24hrs), Avg:101 F (38.3 C), Min:98.8 F (37.1 C), Max:104 F (40 C)  Recent Labs  Lab 12/18/17 2230 12/19/17 0105 12/19/17 0403  WBC 19.7*  --  22.3*  CREATININE 0.98  --  1.24*  LATICACIDVEN 3.5* 3.1*  --     Estimated Creatinine Clearance: 30.1 mL/min (A) (by C-G formula based on SCr of 1.24 mg/dL (H)).    Allergies  Allergen Reactions  . Aspirin Other (See Comments)    Unknown reaction  . Sulfa Antibiotics Other (See Comments)    Unknown reaction    Antimicrobials this admission: Vancomycin 9/24 >> 9/25 Cefepime 9/24 x 1 dose Metronidazole 9/24 >> 9/25 Ceftriaxone 9/25 >> Azithromycin 9/25 >>  Dose adjustments this admission: N/A  Microbiology results: 9/24 BCx: NG < 12 hours 9/24 UCx: pending   9/25 MRSA PCR: negative  Thank you for allowing pharmacy to be a part of this patient's care.  Pricilla RiffleAbby K Kiley Solimine, PharmD Pharmacy Resident  12/19/2017 12:23 PM

## 2017-12-19 NOTE — Progress Notes (Signed)
Transported pt to ICU 7 on the bipap without incident. Pt remains on the bipap and tol well at this time. Report given to ICU RT.

## 2017-12-19 NOTE — ED Notes (Signed)
Called to give report to floor. Charge nurse Marcelino Duster states they are not ready and will call back.

## 2017-12-19 NOTE — Progress Notes (Signed)
Advanced Care Plan.  Purpose of Encounter: CODE STATUS Parties in Attendance: The patient and me. Patient's Decisional Capacity: Yes. Medical Story: Jenna Dudley  is a 82 y.o. female  with history of hypertension, hyperlipidemia, chronic diastolic CHF, chronic back pain, spinal stenosis and GERD.  She is admitted for acute respiratory failure with hypoxia due to severe sepsis and pneumonia.  I discussed with the patient about her current condition, prognosis and CODE STATUS.  She stated that she wants to be resuscitated and intubated to get her back.  Plan:  Code Status: Full code. Time spent discussing advance care planning: 16-17 minutes.

## 2017-12-19 NOTE — Progress Notes (Signed)
PHARMACY - PHYSICIAN COMMUNICATION CRITICAL VALUE ALERT - BLOOD CULTURE IDENTIFICATION (BCID)  Results for orders placed or performed during the hospital encounter of 12/18/17  Blood Culture ID Panel (Reflexed) (Collected: 12/18/2017 10:38 PM)  Result Value Ref Range   Enterococcus species NOT DETECTED NOT DETECTED   Listeria monocytogenes NOT DETECTED NOT DETECTED   Staphylococcus species DETECTED (A) NOT DETECTED   Staphylococcus aureus NOT DETECTED NOT DETECTED   Methicillin resistance DETECTED (A) NOT DETECTED   Streptococcus species NOT DETECTED NOT DETECTED   Streptococcus agalactiae NOT DETECTED NOT DETECTED   Streptococcus pneumoniae NOT DETECTED NOT DETECTED   Streptococcus pyogenes NOT DETECTED NOT DETECTED   Acinetobacter baumannii NOT DETECTED NOT DETECTED   Enterobacteriaceae species NOT DETECTED NOT DETECTED   Enterobacter cloacae complex NOT DETECTED NOT DETECTED   Escherichia coli NOT DETECTED NOT DETECTED   Klebsiella oxytoca NOT DETECTED NOT DETECTED   Klebsiella pneumoniae NOT DETECTED NOT DETECTED   Proteus species NOT DETECTED NOT DETECTED   Serratia marcescens NOT DETECTED NOT DETECTED   Haemophilus influenzae NOT DETECTED NOT DETECTED   Neisseria meningitidis NOT DETECTED NOT DETECTED   Pseudomonas aeruginosa NOT DETECTED NOT DETECTED   Candida albicans NOT DETECTED NOT DETECTED   Candida glabrata NOT DETECTED NOT DETECTED   Candida krusei NOT DETECTED NOT DETECTED   Candida parapsilosis NOT DETECTED NOT DETECTED   Candida tropicalis NOT DETECTED NOT DETECTED    Name of physician (or Provider) Contacted:  Harlon DittyJeremiah Keene   Changes to prescribed antibiotics required:  Yes, will d/c Azithromycin and start Vancomycin   Neldon Shepard D 12/19/2017  9:50 PM

## 2017-12-19 NOTE — Progress Notes (Signed)
Pharmacy Antibiotic Note  Jenna Dudley is a 82 y.o. female admitted on 12/18/2017 with bacteremia.  Pharmacy has been consulted for Vancomycin  dosing.  Plan:  Vancomycin 1 gm IV X 1 ordered to be given on 9/25 @ 22:00. Vancomycin 750 mg IV Q24H ordered to start on 9/26 @ 0600, ~ 8 hrs after 1st dose (stacked dosing). This pt will reach Css yb 9/29 @ 22:00. Will draw 1st trough on 9/29 @ 0530, which will be very close to Css.   Ke 0.0360 T1/2 19 hrs Goal trough 15 - 20 mcg/mL  Height: 5' (152.4 cm) Weight: 140 lb (63.5 kg) IBW/kg (Calculated) : 45.5  Temp (24hrs), Avg:100.2 F (37.9 C), Min:98.6 F (37 C), Max:104 F (40 C)  Recent Labs  Lab 12/18/17 2230 12/19/17 0105 12/19/17 0403 12/19/17 1426 12/19/17 1649  WBC 19.7*  --  22.3*  --   --   CREATININE 0.98  --  1.24*  --   --   LATICACIDVEN 3.5* 3.1*  --  2.3* 3.4*    Estimated Creatinine Clearance: 30.1 mL/min (A) (by C-G formula based on SCr of 1.24 mg/dL (H)).    Allergies  Allergen Reactions  . Aspirin Other (See Comments)    Unknown reaction  . Sulfa Antibiotics Other (See Comments)    Unknown reaction    Antimicrobials this admission:   >>    >>   Dose adjustments this admission:   Microbiology results:  BCx:   UCx:     Sputum:    MRSA PCR:   Thank you for allowing pharmacy to be a part of this patient's care.  Treyce Spillers D 12/19/2017 10:01 PM

## 2017-12-19 NOTE — Consult Note (Addendum)
PULMONARY / CRITICAL CARE MEDICINE   Name: Jenna Dudley MRN: [id number] DOB: [birth year]    ADMISSION DATE:  12/18/2017 CONSULTATION DATE:  12/19/2017  REFERRING MD:  Dr. Anne Hahn  CHIEF COMPLAINT:  Shortness of breath  BRIEF DESCRIPTION: 82 y.o. Female admitted with Acute Hypoxic Respiratory Failure secondary to HCAP and sepsis  HISTORY OF PRESENT ILLNESS:   Jenna Dudley is a 82 y.o. Female with a PMH as listed below who presents to [facility] ED on 12/18/17 with c/o shortness of breath and confusion.  Pt has been confused and unable to contribute in obtaining history, therefore history is obtained from ED and nursing notes.  It is noted EMS found pt hypoxic with O2 sats in the 70's, of which she was placed on CPAP.  Upon arrival to ED she was placed on BiPAP, with improvement in O2 sats to the mid 90's.  Pt noted to be febrile 104 F, tachycardic, and tachypneic in the ED.  Initial workup in the ED revealed WBC 19.7, lactic acid 3.45, troponin negative.  CXR concerning for right sided Pneumonia.  Blood cultures were drawn, IVF given, and broad spectrum antibiotics given.  Pt was admitted to [facility] ICU for treatment of Acute Hypoxic Respiratory Failure in the setting of HCAP requiring BiPAP, and sepsis.  PCCM is consulted for further management.  Subjective: Patient was seen in the ICU 12/19/2017 in the morning, overall doing well, came off the BiPAP, denies any cough wheezing chest pain or shortness of breath -Tells me that her husband was sick and had a cold before she started having the symptoms -Denies any smoking history no history of any COPD or asthma -History of mastoid surgery status post chemotherapy complicated by facial droop  PAST MEDICAL HISTORY :  She  has a past medical history of Bilateral carpal tunnel syndrome (07/27/14), Bipolar disorder (HCC), Chronic diastolic CHF (congestive heart failure) (HCC), Chronic low back pain (07/29/14), Degenerative arthritis of lumbar spine (07/27/14), Dementia,  Depression, Esophageal spasm (07/29/14), GERD (gastroesophageal reflux disease), Hyperlipemia (07/29/14), Hypertension, and Spinal stenosis (07/29/14).  PAST SURGICAL HISTORY: She  has a past surgical history that includes Hip surgery (Right); Replacement total knee bilateral (Bilateral, 07/27/14); and Inner ear surgery (Right).  Allergies  Allergen Reactions  . Aspirin Other (See Comments)    Unknown reaction  . Sulfa Antibiotics Other (See Comments)    Unknown reaction    Current Facility-Administered Medications on File Prior to Encounter  Medication  . lactated ringers infusion   Current Outpatient Medications on File Prior to Encounter  Medication Sig  . acetaminophen-codeine (TYLENOL #3) 300-30 MG per tablet Take 1 tablet by mouth every 8 (eight) hours as needed for moderate pain or severe pain.  . cholecalciferol (VITAMIN D) 1000 units tablet Take 1,000 Units by mouth daily.  . ferrous sulfate 325 (65 FE) MG EC tablet Take 325 mg by mouth daily with breakfast.  . furosemide (LASIX) 20 MG tablet TAKE 1 TABLET BY MOUTH ONCE DAILY (Patient taking differently: Take 20 mg by mouth at bedtime. )  . losartan (COZAAR) 25 MG tablet Take 1 tablet (25 mg total) by mouth daily.  . magnesium oxide (MAG-OX) 400 MG tablet Take 400 mg by mouth daily.  . metoprolol tartrate (LOPRESSOR) 50 MG tablet Take 50 mg by mouth 2 (two) times daily.  . mirtazapine (REMERON) 45 MG tablet Take 1 tablet (45 mg total) by mouth at bedtime.  Marland Kitchen OLANZapine (ZYPREXA) 20 MG tablet Take 1 tablet (20 mg total) by  mouth at bedtime.  Marland Kitchen omeprazole (PRILOSEC) 20 MG capsule Take 20 mg by mouth 2 (two) times daily before a meal.   . simvastatin (ZOCOR) 40 MG tablet Take 40 mg by mouth at bedtime.   Marland Kitchen venlafaxine XR (EFFEXOR-XR) 150 MG 24 hr capsule TAKE 1 CAPSULE BY MOUTH DAILY WITH BREAKFAST    FAMILY HISTORY:  Her She indicated that her mother is deceased. She indicated that her father is deceased.   SOCIAL HISTORY: She   reports that she quit smoking about 42 years ago. Her smoking use included cigarettes. She has never used smokeless tobacco. She reports that she does not drink alcohol or use drugs.  REVIEW OF SYSTEMS:   Positives in BOLD: Pt denies all complaints Gen: Denies fever, chills, weight change, fatigue, night sweats HEENT: Denies blurred vision, double vision, hearing loss, tinnitus, sinus congestion, rhinorrhea, sore throat, neck stiffness, dysphagia PULM: Denies shortness of breath, cough, sputum production, hemoptysis, wheezing CV: Denies chest pain, edema, orthopnea, paroxysmal nocturnal dyspnea, palpitations GI: Denies abdominal pain, nausea, vomiting, diarrhea, hematochezia, melena, constipation, change in bowel habits GU: Denies dysuria, hematuria, polyuria, oliguria, urethral discharge Endocrine: Denies hot or cold intolerance, polyuria, polyphagia or appetite change Derm: Denies rash, dry skin, scaling or peeling skin change Heme: Denies easy bruising, bleeding, bleeding gums Neuro: Denies headache, numbness, weakness, slurred speech, loss of memory or consciousness    VITAL SIGNS: BP 100/75   Pulse (!) 108   Temp 99.7 F (37.6 C) (Axillary)   Resp (!) 27   Ht 5' (1.524 m)   Wt 63.5 kg   LMP  (LMP Unknown)   SpO2 100%   BMI 27.34 kg/m   HEMODYNAMICS:    VENTILATOR SETTINGS:    INTAKE / OUTPUT: No intake/output data recorded.  PHYSICAL EXAMINATION: General:  Acutely ill appearing, improved off BiPAP Neuro: Alert awake oriented,no focal deficits left facial droop HEENT:  Atraumatic, normocephalic, neck supple, no JVD Cardiovascular:  RRR, s1s2, no M/R/G, palpable pulses throughout Lungs:  Clear on left, coarse lung sounds on right, even, nonlabored, normal effort, BiPAP assisted Abdomen:  Soft, nontender, nondistended, BS+ x4 Musculoskeletal:  No deformities, normal bulk and tone Skin:  Warm/dry.  No obvious rashes, lesions, or ulcerations  LABS:  BMET Recent  Labs  Lab 12/18/17 2230 12/19/17 0403  NA 134* 134*  K 4.4 4.1  CL 97* 101  CO2 25 22  BUN 19 23  CREATININE 0.98 1.24*  GLUCOSE 168* 114*    Electrolytes Recent Labs  Lab 12/18/17 2230 12/19/17 0403  CALCIUM 9.1 8.5*    CBC Recent Labs  Lab 12/18/17 2230 12/19/17 0403  WBC 19.7* 22.3*  HGB 12.3 9.9*  HCT 36.2 29.0*  PLT 342 205    Coag's No results for input(s): APTT, INR in the last 168 hours.  Sepsis Markers Recent Labs  Lab 12/18/17 2230 12/19/17 0105  LATICACIDVEN 3.5* 3.1*    ABG No results for input(s): PHART, PCO2ART, PO2ART in the last 168 hours.  Liver Enzymes Recent Labs  Lab 12/18/17 2230  AST 48*  ALT 28  ALKPHOS 95  BILITOT 0.5  ALBUMIN 4.3    Cardiac Enzymes Recent Labs  Lab 12/18/17 2230  TROPONINI <0.03    Glucose Recent Labs  Lab 12/19/17 0256  GLUCAP 116*    Imaging Dg Chest Port 1 View  Result Date: 12/18/2017 CLINICAL DATA:  82 year old female with fever and sepsis. EXAM: PORTABLE CHEST 1 VIEW COMPARISON:  Chest radiograph dated 04/20/2017 FINDINGS: Patchy  area of density in the right perihilar region is new compared to the prior radiograph and concerning for pneumonia. Asymmetric edema is not excluded. Clinical correlation is recommended. The right apex is not well evaluated due to superimposition of the patient's jaw. No large pleural effusion. No pneumothorax. The cardiac silhouette is within normal limits. Atherosclerotic calcification of the aortic arch. No acute osseous pathology. IMPRESSION: Right perihilar infiltrate versus asymmetric edema. Clinical correlation is recommended. Electronically Signed   By: Elgie Collard M.D.   On: 12/18/2017 23:19     STUDIES:   CULTURES: Blood x2 12/18/17>> Urine 12/18/17>> Sputum 12/18/17>> Strep pneumo urinary antigen 9/25>> Legionella urinary antigen 9/25>>  ANTIBIOTICS: Vancomycin 9/24>> stopped on 925 Cefepime 9/24>> stopped on 925 Flagyl 9/24>> stopped on  925 Rocephin 9/25 Zithromax 9/25  SIGNIFICANT EVENTS: 12/18/17>> Admission to [facility] ICU  LINES/TUBES:   DISCUSSION: 82 y.o. Female admitted with Acute Hypoxic Respiratory Failure requiring BiPAP in setting of HCAP and Sepsis.  ASSESSMENT / PLAN:  PULMONARY A: Acute Hypoxic Respiratory Failure in setting of HCAP P:   Supplemental O2 to maintain O2 sats >92%-on room air doing well BiPAP, wean as tolerated-was placed on BiPAP doing well currently on BiPAP Abx as above Follow intermittent CXR-chest x-ray showed right middle lobe infiltrate Pulmonary hygiene-add bronchodilators  CARDIOVASCULAR A:  HTN Hx: HTN, HLD, Diastolic CHF P:  Cardiac monitoring Maintain MAP >65 Received 1L NS bolus in ED 9/24 IVF: NS @decreased  to 50 cc/h, resume losartan, if doing well start metoprolol from tomorrow  RENAL A:   Lactic acidosis Mild Hyponatremia P:   Monitor I&O's / urinary output Follow BMP Ensure adequate renal perfusion Avoid nephrotoxic agents as able Replace electrolytes as indicated IVF: NS @ 150 ml/hr-change to 50 cc/h Trend Lactic acid-repeat tomorrow morning  GASTROINTESTINAL A:   No acute issues Hx: GERD, esophageal spasm P:   Start diet Continue home Protonix PO  HEMATOLOGIC A:   No acute issues P:  Monitor for s/sx of bleeding Trend CBC Lovenox for VTE prophylaxis Transfuse for Hgb <7  INFECTIOUS A:   Meets SIRS criteria Sepsis secondary to HCAP HCAP P:   Monitor fever curve Trend WBC Trend PCT- 22 on initial admission Follow cultures as above Discontinue vancomycin, Cefepime, and Flagyl -Start Rocephin plus Zithromax for community-acquired pneumonia coverage  ENDOCRINE A:   Hyperglycemia  P:   CBG's SSI Follow ICU Hypo/hypglycemia protocol  NEUROLOGIC A:   Altered Mental Status>> improved, pt now oriented to person, place, situation Hx: Depression, dementia, Bipolar disorder P:   Provide supportive care Lights on during the  day Promote normal wake/sleep cycle Avoid sedating meds as able Continue home Remeron, Zyprexa, and Effexor   FAMILY  - Updates: Updated pt at bedside 9/25. No family present during NP rounds on 9/25.  - Inter-disciplinary family meet or Palliative Care meeting due by:  12/25/17    Harlon Ditty, AGACNP-BC [place] [facility] Pager: [phone]   12/19/2017, 5:21 AM   STAFF NOTE: I, Dr Roseanne Reno have personally reviewed patient's available data, including medical history, events of note, physical examination and test results as part of my evaluation. I have discussed with resident/NP and other care providers such as pharmacist, RN and RRT.  In addition,  I personally evaluated patient and elicited key findings of   -I personally saw the patient, made appropriate changes, history reviewed chart reviewed patient was examined - Noted suggested - Overall patient is doing well, okay to transfer to floor  as currently on room air - We will sign off call us back if he can be of any further help    Skin/Wound: chronic changes   Electrolytes: Replace electrolytes per ICU electrolyte replacement protocol.   IVF: NS at 50  Nutrition: Diet as tolerated   Prophylaxis: DVT Prophylaxis with heparin,. GI Prophylaxis.   Restraints: None  PT/OT eval and treat. OOB when appropriate.   Lines/Tubes:  No  foley  No  central line.  ADVANCE DIRECTIVE:full code  FAMILY DISCUSSION:spoke with patient   Quality Care: PPI, DVT prophylaxis, HOB elevated, Infection control all reviewed and addressed.  Events and notes from last 24 hours reviewed. Care plan discussed on multidisciplinary rounds  CC TIME:35 min    Old records reviewed discussed results and management plan with patient  Images personally reviewed and results and labs reviewed and discussed with patient.  All medication reviewed and adjusted  Further management depending on test results and work up as  outlined above.    Roseanne Reno, M.D  [facility]

## 2017-12-19 NOTE — Progress Notes (Signed)
The patient feels better, off BiPAP on oxygen by nasal cannula 2 L. Vital signs reviewed, physical examinations done.  Acute respiratory failure with hypoxia. Continue oxygen by nasal cannula, nebulizer as needed.  Severe sepsis due to pneumonia Continue Zithromax and Rocephin, follow-up cultures.  Lactic acidosis.  Follow-up lactic acid     Essential hypertension.  Hold home hypertension meds due to soft blood pressure.    Bipolar 1 disorder (HCC) -continue home medications   HLD (hyperlipidemia) -Home dose antilipid   Acid reflux -Home dose PPI  I discussed with RN and intensivist Dr. Sherryll Burger.  Time spent about 27 minutes.

## 2017-12-19 NOTE — ED Notes (Signed)
Date and time results received: 12/19/17 0135 (use smartphrase ".now" to insert current time)  Test: lactic acid Critical Value: 3.1  Name of Provider Notified: Dr. Manson Passey  Orders Received? Or Actions Taken?:

## 2017-12-20 ENCOUNTER — Encounter: Payer: Self-pay | Admitting: Radiology

## 2017-12-20 ENCOUNTER — Inpatient Hospital Stay: Payer: Medicare Other

## 2017-12-20 DIAGNOSIS — I499 Cardiac arrhythmia, unspecified: Secondary | ICD-10-CM

## 2017-12-20 DIAGNOSIS — R0602 Shortness of breath: Secondary | ICD-10-CM

## 2017-12-20 LAB — URINE CULTURE: Culture: NO GROWTH

## 2017-12-20 LAB — COMPREHENSIVE METABOLIC PANEL
ALT: 174 U/L — ABNORMAL HIGH (ref 0–44)
AST: 155 U/L — ABNORMAL HIGH (ref 15–41)
Albumin: 3.5 g/dL (ref 3.5–5.0)
Alkaline Phosphatase: 58 U/L (ref 38–126)
Anion gap: 11 (ref 5–15)
BILIRUBIN TOTAL: 0.7 mg/dL (ref 0.3–1.2)
BUN: 17 mg/dL (ref 8–23)
CHLORIDE: 102 mmol/L (ref 98–111)
CO2: 23 mmol/L (ref 22–32)
Calcium: 8.4 mg/dL — ABNORMAL LOW (ref 8.9–10.3)
Creatinine, Ser: 0.93 mg/dL (ref 0.44–1.00)
GFR calc Af Amer: >60 mL/min (ref >60)
GFR calc non Af Amer: 57 mL/min — ABNORMAL LOW (ref >60)
GLUCOSE: 112 mg/dL — AB (ref 70–99)
POTASSIUM: 4.4 mmol/L (ref 3.5–5.1)
SODIUM: 136 mmol/L (ref 135–145)
Total Protein: 6.4 g/dL — ABNORMAL LOW (ref 6.5–8.1)

## 2017-12-20 LAB — CBC WITH DIFFERENTIAL/PLATELET
Basophils Absolute: 0 10*3/uL (ref 0–0.1)
Basophils Relative: 0 %
EOS ABS: 0 10*3/uL (ref 0–0.7)
EOS PCT: 0 %
HCT: 28.4 % — ABNORMAL LOW (ref 35.0–47.0)
Hemoglobin: 9.7 g/dL — ABNORMAL LOW (ref 12.0–16.0)
Lymphocytes Relative: 4 %
Lymphs Abs: 0.7 10*3/uL — ABNORMAL LOW (ref 1.0–3.6)
MCH: 34.4 pg — AB (ref 26.0–34.0)
MCHC: 34.3 g/dL (ref 32.0–36.0)
MCV: 100.4 fL — ABNORMAL HIGH (ref 80.0–100.0)
MONO ABS: 0.8 10*3/uL (ref 0.2–0.9)
MONOS PCT: 4 %
Neutro Abs: 17.2 10*3/uL — ABNORMAL HIGH (ref 1.4–6.5)
Neutrophils Relative %: 92 %
PLATELETS: 157 10*3/uL (ref 150–440)
RBC: 2.83 MIL/uL — ABNORMAL LOW (ref 3.80–5.20)
RDW: 14.3 % (ref 11.5–14.5)
WBC: 18.8 10*3/uL — AB (ref 3.6–11.0)

## 2017-12-20 LAB — LACTIC ACID, PLASMA
LACTIC ACID, VENOUS: 3.5 mmol/L — AB (ref 0.5–1.9)
LACTIC ACID, VENOUS: 3.2 mmol/L — AB (ref 0.5–1.9)
LACTIC ACID, VENOUS: 1.8 mmol/L (ref 0.5–1.9)
Lactic Acid, Venous: 4.1 mmol/L (ref 0.5–1.9)

## 2017-12-20 LAB — GLUCOSE, CAPILLARY
GLUCOSE-CAPILLARY: 110 mg/dL — AB (ref 70–99)
GLUCOSE-CAPILLARY: 100 mg/dL — AB (ref 70–99)
Glucose-Capillary: 112 mg/dL — ABNORMAL HIGH (ref 70–99)
Glucose-Capillary: 95 mg/dL (ref 70–99)
Glucose-Capillary: 94 mg/dL (ref 70–99)
Glucose-Capillary: 111 mg/dL — ABNORMAL HIGH (ref 70–99)
Glucose-Capillary: 101 mg/dL — ABNORMAL HIGH (ref 70–99)

## 2017-12-20 LAB — MAGNESIUM: Magnesium: 1.6 mg/dL — ABNORMAL LOW (ref 1.7–2.4)

## 2017-12-20 LAB — PROCALCITONIN: Procalcitonin: 19.96 ng/mL

## 2017-12-20 LAB — STREP PNEUMONIAE URINARY ANTIGEN: Strep Pneumo Urinary Antigen: NEGATIVE

## 2017-12-20 MED ORDER — FAMOTIDINE IN NACL 20-0.9 MG/50ML-% IV SOLN
20.0000 mg | Freq: Every day | INTRAVENOUS | Status: DC
  Administered 2017-12-20: 20 mg via INTRAVENOUS
  Filled 2017-12-20: qty 50

## 2017-12-20 MED ORDER — METOPROLOL TARTRATE 5 MG/5ML IV SOLN
2.5000 mg | Freq: Once | INTRAVENOUS | Status: AC
  Administered 2017-12-20: 2.5 mg via INTRAVENOUS
  Filled 2017-12-20: qty 5

## 2017-12-20 MED ORDER — SODIUM CHLORIDE 0.9 % IV BOLUS
1000.0000 mL | Freq: Once | INTRAVENOUS | Status: AC
  Administered 2017-12-20: 1000 mL via INTRAVENOUS

## 2017-12-20 MED ORDER — IOPAMIDOL (ISOVUE-300) INJECTION 61%
100.0000 mL | Freq: Once | INTRAVENOUS | Status: AC | PRN
  Administered 2017-12-20: 100 mL via INTRAVENOUS

## 2017-12-20 MED ORDER — SODIUM CHLORIDE 0.9 % IV SOLN
500.0000 mg | Freq: Every day | INTRAVENOUS | Status: DC
  Administered 2017-12-20 – 2017-12-21 (×2): 500 mg via INTRAVENOUS
  Filled 2017-12-20 (×3): qty 500

## 2017-12-20 MED ORDER — MORPHINE SULFATE (PF) 2 MG/ML IV SOLN
1.0000 mg | Freq: Once | INTRAVENOUS | Status: AC
  Administered 2017-12-20: 1 mg via INTRAVENOUS
  Filled 2017-12-20: qty 1

## 2017-12-20 MED ORDER — OLANZAPINE 10 MG IM SOLR
2.5000 mg | Freq: Once | INTRAMUSCULAR | Status: AC
  Administered 2017-12-20: 2.5 mg via INTRAMUSCULAR
  Filled 2017-12-20: qty 10

## 2017-12-20 MED ORDER — ENOXAPARIN SODIUM 40 MG/0.4ML ~~LOC~~ SOLN
40.0000 mg | SUBCUTANEOUS | Status: DC
  Administered 2017-12-21 – 2017-12-25 (×5): 40 mg via SUBCUTANEOUS
  Filled 2017-12-20 (×5): qty 0.4

## 2017-12-20 MED ORDER — FUROSEMIDE 10 MG/ML IJ SOLN
20.0000 mg | Freq: Once | INTRAMUSCULAR | Status: AC
  Administered 2017-12-20: 20 mg via INTRAVENOUS
  Filled 2017-12-20: qty 2

## 2017-12-20 MED ORDER — SODIUM CHLORIDE 0.9 % IV SOLN
INTRAVENOUS | Status: DC
  Administered 2017-12-20: 15:00:00 via INTRAVENOUS

## 2017-12-20 MED ORDER — ACETAMINOPHEN-CODEINE #3 300-30 MG PO TABS
1.0000 | ORAL_TABLET | Freq: Three times a day (TID) | ORAL | Status: DC | PRN
  Administered 2017-12-20 – 2017-12-24 (×3): 1 via ORAL
  Filled 2017-12-20 (×3): qty 1

## 2017-12-20 MED ORDER — MAGNESIUM SULFATE 2 GM/50ML IV SOLN
2.0000 g | Freq: Once | INTRAVENOUS | Status: AC
  Administered 2017-12-20: 2 g via INTRAVENOUS
  Filled 2017-12-20: qty 50

## 2017-12-20 NOTE — Progress Notes (Signed)
[employer] - [place] at [facility]   PATIENT NAME: Jenna Dudley    MR#:  [id number]  DATE OF BIRTH:  1935/05/01  SUBJECTIVE:  CHIEF COMPLAINT:  No chief complaint on file.  The patient has better shortness of breath but has abdominal pain.  Tachycardia at 120s, tachypnea at the 30s.  Off oxygen by nasal cannula. REVIEW OF SYSTEMS:  Review of Systems  Constitutional: Positive for malaise/fatigue. Negative for chills and fever.  HENT: Negative for sore throat.   Eyes: Negative for blurred vision and double vision.  Respiratory: Positive for cough, sputum production and shortness of breath. Negative for hemoptysis, wheezing and stridor.   Cardiovascular: Negative for chest pain, palpitations, orthopnea and leg swelling.  Gastrointestinal: Positive for abdominal pain. Negative for blood in stool, diarrhea, melena, nausea and vomiting.  Genitourinary: Negative for dysuria, flank pain and hematuria.  Musculoskeletal: Negative for back pain and joint pain.  Skin: Negative for rash.  Neurological: Negative for dizziness, sensory change, focal weakness, seizures, loss of consciousness, weakness and headaches.  Endo/Heme/Allergies: Negative for polydipsia.  Psychiatric/Behavioral: Negative for depression. The patient is not nervous/anxious.     DRUG ALLERGIES:   Allergies  Allergen Reactions  . Aspirin Other (See Comments)    Unknown reaction  . Sulfa Antibiotics Other (See Comments)    Unknown reaction   VITALS:  Blood pressure (!) 118/55, pulse (!) 123, temperature 99.1 F (37.3 C), resp. rate (!) 36, height 5' (1.524 m), weight 63.5 kg, SpO2 95 %. PHYSICAL EXAMINATION:  Physical Exam  Constitutional: She is oriented to person, place, and time. No distress.  HENT:  Head: Normocephalic.  Mouth/Throat: Oropharynx is clear and moist.  Eyes: Pupils are equal, round, and reactive to light. Conjunctivae and EOM are normal. No scleral icterus.  Neck: Normal range  of motion. Neck supple. No JVD present. No tracheal deviation present.  Cardiovascular: Normal rate, regular rhythm and normal heart sounds. Exam reveals no gallop.  No murmur heard. Pulmonary/Chest: Effort normal. No respiratory distress. She has no wheezes. She has no rales.  Bilateral crackles.  Abdominal: Soft. Bowel sounds are normal. She exhibits no distension. There is no tenderness. There is no rebound.  Musculoskeletal: Normal range of motion. She exhibits no edema or tenderness.  Neurological: She is alert and oriented to person, place, and time. No cranial nerve deficit.  Skin: No rash noted. No erythema.  Psychiatric: She has a normal mood and affect.   LABORATORY PANEL:  Female CBC Recent Labs  Lab 12/20/17 0612  WBC 18.8*  HGB 9.7*  HCT 28.4*  PLT 157   ------------------------------------------------------------------------------------------------------------------ Chemistries  Recent Labs  Lab 12/20/17 0612  NA 136  K 4.4  CL 102  CO2 23  GLUCOSE 112*  BUN 17  CREATININE 0.93  CALCIUM 8.4*  MG 1.6*  AST 155*  ALT 174*  ALKPHOS 58  BILITOT 0.7   RADIOLOGY:  Ct Chest W Contrast  Result Date: 12/20/2017 CLINICAL DATA:  Altered mental status and hypoxia. Fever, tachycardia and tachypnea. Elevated white blood cell count. EXAM: CT CHEST, ABDOMEN, AND PELVIS WITH CONTRAST TECHNIQUE: Multidetector CT imaging of the chest, abdomen and pelvis was performed following the standard protocol during bolus administration of intravenous contrast. CONTRAST:  ISOVUE-300 IOPAMIDOL (ISOVUE-300) INJECTION 61%. No oral contrast given per ordering physician. COMPARISON:  Chest CT 04/17/2017 FINDINGS: CT CHEST FINDINGS Cardiovascular: Heart size is normal. There is calcified plaque over the 3 vessel coronary arteries. There is calcified plaque over  the thoracic aorta. Remaining vascular structures are unremarkable. Mediastinum/Nodes: Few small mediastinal lymph nodes likely  reactive. No hilar adenopathy. Small hiatal hernia. Lungs/Pleura: Lungs are adequately inflated with mild elevation of the right hemidiaphragm unchanged. Moderate multifocal airspace process within the right lung most notable over the right lower lobe compatible with multifocal pneumonia. Tiny amount of left pleural fluid with left basilar atelectasis. Airways are within normal. CT ABDOMEN PELVIS FINDINGS Hepatobiliary: Liver and biliary tree are within normal. Previous cholecystectomy. Mild post cholecystectomy prominence of the common bile duct. Pancreas: Fatty atrophy of the pancreas which is otherwise within normal. Spleen: Normal Adrenals/Urinary Tract: Adrenal glands are normal. Kidneys are normal in size without hydronephrosis or nephrolithiasis. Ureters and bladder are normal. Stomach/Bowel: Small hiatal hernia. Small bowel is within normal. Appendix is normal. Mild fecal retention throughout the colon which is otherwise within normal. Vascular/Lymphatic: Mild-to-moderate calcified plaque over the abdominal aorta and iliac arteries. No adenopathy. Reproductive: Previous hysterectomy. Other: No significant free fluid or focal inflammatory change. Surgical clips over the right lower anterior abdominal wall. Musculoskeletal: Degenerative change of the spine. Mild L1 compression fracture unchanged. Degenerative change of the hips. IMPRESSION: Multifocal airspace process involving the right lung worse over the right lower lobe compatible with a multifocal pneumonia. Tiny amount of left pleural fluid with left basilar atelectasis. No acute findings in the abdomen/pelvis. Aortic Atherosclerosis (ICD10-I70.0). Atherosclerotic coronary artery disease. Small hiatal hernia. Stable mild L1 compression fracture. Electronically Signed   By: Elberta Fortis M.D.   On: 12/20/2017 15:07   Ct Abdomen Pelvis W Contrast  Result Date: 12/20/2017 CLINICAL DATA:  Altered mental status and hypoxia. Fever, tachycardia and  tachypnea. Elevated white blood cell count. EXAM: CT CHEST, ABDOMEN, AND PELVIS WITH CONTRAST TECHNIQUE: Multidetector CT imaging of the chest, abdomen and pelvis was performed following the standard protocol during bolus administration of intravenous contrast. CONTRAST:  ISOVUE-300 IOPAMIDOL (ISOVUE-300) INJECTION 61%. No oral contrast given per ordering physician. COMPARISON:  Chest CT 04/17/2017 FINDINGS: CT CHEST FINDINGS Cardiovascular: Heart size is normal. There is calcified plaque over the 3 vessel coronary arteries. There is calcified plaque over the thoracic aorta. Remaining vascular structures are unremarkable. Mediastinum/Nodes: Few small mediastinal lymph nodes likely reactive. No hilar adenopathy. Small hiatal hernia. Lungs/Pleura: Lungs are adequately inflated with mild elevation of the right hemidiaphragm unchanged. Moderate multifocal airspace process within the right lung most notable over the right lower lobe compatible with multifocal pneumonia. Tiny amount of left pleural fluid with left basilar atelectasis. Airways are within normal. CT ABDOMEN PELVIS FINDINGS Hepatobiliary: Liver and biliary tree are within normal. Previous cholecystectomy. Mild post cholecystectomy prominence of the common bile duct. Pancreas: Fatty atrophy of the pancreas which is otherwise within normal. Spleen: Normal Adrenals/Urinary Tract: Adrenal glands are normal. Kidneys are normal in size without hydronephrosis or nephrolithiasis. Ureters and bladder are normal. Stomach/Bowel: Small hiatal hernia. Small bowel is within normal. Appendix is normal. Mild fecal retention throughout the colon which is otherwise within normal. Vascular/Lymphatic: Mild-to-moderate calcified plaque over the abdominal aorta and iliac arteries. No adenopathy. Reproductive: Previous hysterectomy. Other: No significant free fluid or focal inflammatory change. Surgical clips over the right lower anterior abdominal wall. Musculoskeletal:  Degenerative change of the spine. Mild L1 compression fracture unchanged. Degenerative change of the hips. IMPRESSION: Multifocal airspace process involving the right lung worse over the right lower lobe compatible with a multifocal pneumonia. Tiny amount of left pleural fluid with left basilar atelectasis. No acute findings in the abdomen/pelvis. Aortic Atherosclerosis (  ICD10-I70.0). Atherosclerotic coronary artery disease. Small hiatal hernia. Stable mild L1 compression fracture. Electronically Signed   By: Elberta Fortis M.D.   On: 12/20/2017 15:07   Dg Chest Port 1 View  Result Date: 12/20/2017 CLINICAL DATA:  Shortness of breath. EXAM: PORTABLE CHEST 1 VIEW COMPARISON:  12/18/2017. FINDINGS: Heart size normal. Progressive right perihilar infiltrate and consolidation suggesting progressive pneumonia and/or asymmetric pulmonary edema. No pleural effusion or pneumothorax IMPRESSION: Progressive right perihilar infiltrate and consolidation suggesting progressive pneumonia and/or asymmetric pulmonary edema. Electronically Signed   By: Maisie Fus  Register   On: 12/20/2017 09:23   ASSESSMENT AND PLAN:   Acute respiratory failure with hypoxia. Off xygen by nasal cannula, nebulizer as needed.  Severe sepsis due to pneumonia CT chest showed multifocal pneumonia  Continue Zithromax and Rocephin, follow-up BC and cultures.  Lactic acidosis.  Lactic acid is still elevated, continue treatment as above and Follow-up lactic acid.  Hypomagnesemia.  Magnesium supplement.  Essential hypertension.  Hold home hypertension meds due to soft blood pressure.  Bipolar 1 disorder (HCC) -continue home medications HLD (hyperlipidemia) -Home dose antilipid Acid reflux -Home dose PPI  All the records are reviewed and case discussed with Care Management/Social Worker. Management plans discussed with the patient, family and they are in agreement.  CODE STATUS: Full Code  TOTAL TIME TAKING CARE OF THIS  PATIENT: 37 minutes.   More than 50% of the time was spent in counseling/coordination of care: YES  POSSIBLE D/C IN 3 DAYS, DEPENDING ON CLINICAL CONDITION.   Shaune Pollack M.D on 12/20/2017 at 6:19 PM  Between 7am to 6pm - Pager - [phone]  After 6pm go to [email or web address] - Therapist, nutritional Hospitalists

## 2017-12-20 NOTE — Progress Notes (Signed)
Pharmacy Antibiotic Note  JOYCELIN Dudley is a 82 y.o. female admitted on 12/18/2017 with CAP. Pharmacy has been consulted for vancomycin dosing.  Plan: Vancomycin restarted on 9/25 for 1/4 MRSS. Per ICU rounds, felt to be contaminant and vancomycin discontinued.   Azithromycin restarted.   Continue ceftriaxone 1g IV Q24hr.    Height: 5' (152.4 cm) Weight: 140 lb (63.5 kg) IBW/kg (Calculated) : 45.5  Temp (24hrs), Avg:99.2 F (37.3 C), Min:98.2 F (36.8 C), Max:101 F (38.3 C)  Recent Labs  Lab 12/18/17 2230  12/19/17 0403 12/19/17 1426 12/19/17 1649 12/20/17 0612 12/20/17 1244 12/20/17 1650  WBC 19.7*  --  22.3*  --   --  18.8*  --   --   CREATININE 0.98  --  1.24*  --   --  0.93  --   --   LATICACIDVEN 3.5*   < >  --  2.3* 3.4* 4.1* 3.5* 3.2*   < > = values in this interval not displayed.    Estimated Creatinine Clearance: 40.1 mL/min (by C-G formula based on SCr of 0.93 mg/dL).    Allergies  Allergen Reactions  . Aspirin Other (See Comments)    Unknown reaction  . Sulfa Antibiotics Other (See Comments)    Unknown reaction    Antimicrobials this admission: Vancomycin 9/24 >> 9/25, 9/25 Cefepime 9/24 x 1 dose Metronidazole 9/24 >> 9/25 Ceftriaxone 9/25 >> Azithromycin 9/25 >>  Dose adjustments this admission: N/A  Microbiology results: 9/24 BCx: MRSS 1/4  9/26 BCx: pending  9/24 UCx: negative 9/25 MRSA PCR: negative Thank you for allowing pharmacy to be a part of this patient's care.  Jenna Dudley 12/20/2017 5:47 PM

## 2017-12-20 NOTE — Progress Notes (Signed)
Notified  Dr Clelia Croft regarding patients lactic acid of 3.5. No additional orders given.

## 2017-12-20 NOTE — Progress Notes (Signed)
Patient had a CT chest and abdomen, CT chest showed multifocal pneumonia but no significant abnormality in the abdomen and pelvis CT -Patient was complaining of some abdominal pain that she chronically has, received 1 mg of morphine - Patient was placed on her home medication Tylenol 3 for PRN pain meds controlled -Patient also was found to have heart rate in 120s 130s looks like sinus on the monitor - Patient received Lasix and put out about 1500 cc of fluid most likely patient might be volume depleted we will start the patient on some IV fluids - Monitor the patient if continued to have tachycardia consider EKG and further work-up to make sure there is no other abnormality like A. fib -Plan of care discussed with the patient, nursing  Roseanne Reno Pulmonary Critical Care [facility] Sleep Medicine

## 2017-12-20 NOTE — Progress Notes (Signed)
Patient continues to refuse blood draws. Patients lethargic to take oral medication. Spoke with Dr Clelia Croft regarding patients guppie breathing. Did speak to MD regarding patients husbands concern regarding her mental health. She is seen by psychiatric.At this point MD orders to continue to monitor and will enter orders.

## 2017-12-20 NOTE — Progress Notes (Signed)
Notified Dr Clelia Croft regarding patients heart rate of 130's. Morphine given. No additional orders given.

## 2017-12-20 NOTE — Progress Notes (Signed)
Spoke with Dr Clelia Croft regarding patients refusal for lab draw. Orders to turn on lights, place patient on 2 liters and take bi pap off. Will re attempt lab draws latter.

## 2017-12-20 NOTE — Progress Notes (Signed)
Spoke with Annabelle Harman PA regarding patients heart rate being in the 120's. Lactic Acid 3.2. She will place orders for EKG and then review.

## 2017-12-20 NOTE — Progress Notes (Signed)
Re-attempted to get patient to consent to get lab draws. Patient states "no". I dont want anything done.

## 2017-12-20 NOTE — Progress Notes (Signed)
PULMONARY / CRITICAL CARE MEDICINE   Name: Jenna Dudley MRN: [id number] DOB: [birth year]    ADMISSION DATE:  12/18/2017 CONSULTATION DATE:  12/19/2017  REFERRING MD:  Dr. Anne Hahn  CHIEF COMPLAINT:  Shortness of breath  BRIEF DESCRIPTION: 82 y.o. Female admitted with Acute Hypoxic Respiratory Failure secondary to HCAP and sepsis  HISTORY OF PRESENT ILLNESS:   Jenna Dudley is a 82 y.o. Female with a PMH as listed below who presents to [facility] ED on 12/18/17 with c/o shortness of breath and confusion.  Pt has been confused and unable to contribute in obtaining history, therefore history is obtained from ED and nursing notes.  It is noted EMS found pt hypoxic with O2 sats in the 70's, of which she was placed on CPAP.  Upon arrival to ED she was placed on BiPAP, with improvement in O2 sats to the mid 90's.  Pt noted to be febrile 104 F, tachycardic, and tachypneic in the ED.  Initial workup in the ED revealed WBC 19.7, lactic acid 3.45, troponin negative.  CXR concerning for right sided Pneumonia.  Blood cultures were drawn, IVF given, and broad spectrum antibiotics given.  Pt was admitted to [facility] ICU for treatment of Acute Hypoxic Respiratory Failure in the setting of HCAP requiring BiPAP, and sepsis.  PCCM is consulted for further management.  Patient was seen in the ICU 12/19/2017 in the morning, overall doing well, came off the BiPAP, denies any cough wheezing chest pain or shortness of breath -Tells me that her husband was sick and had a cold before she started having the symptoms -Denies any smoking history no history of any COPD or asthma -History of mastoid surgery status post chemotherapy complicated by facial droop  Subjective: Overnight patient again require to go on BiPAP, currently patient is arousable, white count is still elevated on 18, temp of 101 noted Increasing LFTs noted with bilirubin remaining stable Mild decrease in pro-Cal noted The patient remained stable  hemodynamically   VITAL SIGNS: BP 119/60   Pulse (!) 107   Temp 99.1 F (37.3 C)   Resp (!) 24   Ht 5' (1.524 m)   Wt 63.5 kg   LMP  (LMP Unknown)   SpO2 94%   BMI 27.34 kg/m      INTAKE / OUTPUT: I/O last 3 completed shifts: In: 3930.6 [P.O.:580; I.V.:438.1; IV Piggyback:2912.5] Out: 1400 [Urine:1400]  PHYSICAL EXAMINATION: General:  Acutely ill appearing, improved off BiPAP Neuro: Alert awake oriented,no focal deficits left facial droop HEENT:  Atraumatic, normocephalic, neck supple, no JVD Cardiovascular:  RRR, s1s2, no M/R/G, palpable pulses throughout Lungs:  Clear on left, coarse lung sounds on right, even, nonlabored, normal effort, BiPAP assisted Abdomen:  Soft, nontender, nondistended, BS+ x4 Musculoskeletal:  No deformities, normal bulk and tone Skin:  Warm/dry.  No obvious rashes, lesions, or ulcerations  LABS:  BMET Recent Labs  Lab 12/18/17 2230 12/19/17 0403 12/20/17 0612  NA 134* 134* 136  K 4.4 4.1 4.4  CL 97* 101 102  CO2 25 22 23   BUN 19 23 17   CREATININE 0.98 1.24* 0.93  GLUCOSE 168* 114* 112*    Electrolytes Recent Labs  Lab 12/18/17 2230 12/19/17 0403 12/20/17 0612  CALCIUM 9.1 8.5* 8.4*  MG  --   --  1.6*    CBC Recent Labs  Lab 12/18/17 2230 12/19/17 0403 12/20/17 0612  WBC 19.7* 22.3* 18.8*  HGB 12.3 9.9* 9.7*  HCT 36.2 29.0* 28.4*  PLT 342 205 157  Coag's No results for input(s): APTT, INR in the last 168 hours.  Sepsis Markers Recent Labs  Lab 12/19/17 0423 12/19/17 1426 12/19/17 1649 12/20/17 0612  LATICACIDVEN  --  2.3* 3.4* 4.1*  PROCALCITON 23.78  --   --  19.96    ABG No results for input(s): PHART, PCO2ART, PO2ART in the last 168 hours.  Liver Enzymes Recent Labs  Lab 12/18/17 2230 12/20/17 0612  AST 48* 155*  ALT 28 174*  ALKPHOS 95 58  BILITOT 0.5 0.7  ALBUMIN 4.3 3.5    Cardiac Enzymes Recent Labs  Lab 12/18/17 2230  TROPONINI <0.03    Glucose Recent Labs  Lab  12/19/17 1121 12/19/17 1657 12/19/17 1924 12/19/17 2128 12/20/17 0356 12/20/17 0732  GLUCAP 106* 104* 132* 101* 112* 110*    Imaging Dg Chest Port 1 View  Result Date: 12/20/2017 CLINICAL DATA:  Shortness of breath. EXAM: PORTABLE CHEST 1 VIEW COMPARISON:  12/18/2017. FINDINGS: Heart size normal. Progressive right perihilar infiltrate and consolidation suggesting progressive pneumonia and/or asymmetric pulmonary edema. No pleural effusion or pneumothorax IMPRESSION: Progressive right perihilar infiltrate and consolidation suggesting progressive pneumonia and/or asymmetric pulmonary edema. Electronically Signed   By: Maisie Fus  Register   On: 12/20/2017 09:23     STUDIES:   CULTURES: Blood x2 12/18/17>> questionable coagulase-negative staph 1 out of 2 Urine 12/18/17>> Sputum 12/18/17>> Strep pneumo urinary antigen 9/25>> Legionella urinary antigen 9/25>>  ANTIBIOTICS: Vancomycin 9/24>> stopped on 925>> resumed back on 926 because of positive culture Cefepime 9/24>> stopped on 925 Flagyl 9/24>> stopped on 925 Rocephin 9/25 Zithromax 9/25  SIGNIFICANT EVENTS: 12/18/17>> Admission to [facility] ICU  LINES/TUBES:   DISCUSSION: 82 y.o. Female admitted with Acute Hypoxic Respiratory Failure requiring BiPAP in setting of HCAP and Sepsis.  ASSESSMENT / PLAN:  PULMONARY A: Acute Hypoxic Respiratory Failure in setting of HCAP P:   Supplemental O2 to maintain O2 sats >92%-on room air doing well-use 1 to 2 L BiPAP, wean as tolerated-was placed on BiPAP doing well currently on BiPAP Abx as above Follow intermittent CXR-chest x-ray showed right middle lobe infiltrate-significant worsening noted today with fever will consider CT chest with contrast to evaluate for any necrotizing infection Pulmonary hygiene-add bronchodilators  CARDIOVASCULAR A:  HTN Hx: HTN, HLD, Diastolic CHF P:  Cardiac monitoring Maintain MAP >65 Received 1L NS bolus in ED 9/24 IVF: NS @decreased  to 50 cc/h,  resume losartan, Give Lasix 20 today - Monitor lactate adjust fluids accordingly - Is about 400- -If lactate continue to go up will consider holding Lasix and give aggressive fluids  RENAL A:   Lactic acidosis Mild Hyponatremia P:   Monitor I&O's / urinary output Follow BMP Ensure adequate renal perfusion Avoid nephrotoxic agents as able Replace electrolytes as indicated  IV fluid to KVO right now Trend Lactic acid-repeat now Magnesium 1.6 replaced  GASTROINTESTINAL A:   No acute issues Hx: GERD, esophageal spasm P:   Start diet Worsening LFTs noted Due to worsening sepsis will consider CT abdomen and pelvis to evaluate further as she is complaining of generalized abdominal pain also Continue home Protonix PO  HEMATOLOGIC A:   No acute issues P:  Monitor for s/sx of bleeding Trend CBC Lovenox for VTE prophylaxis Transfuse for Hgb <7  INFECTIOUS A:   Meets SIRS criteria Sepsis secondary to HCAP HCAP P:   Monitor fever curve Trend WBC Trend PCT- 22 on initial admission Follow cultures as above Discontinue Cefepime, and Flagyl -Start Rocephin plus Zithromax for community-acquired pneumonia coverage -  Blood culture 1 out of 2 staph- vancomycin resumed - Repeat culture today and adjust accordingly  ENDOCRINE A:   Hyperglycemia  P:   CBG's SSI Follow ICU Hypo/hypglycemia protocol  NEUROLOGIC A:   Altered Mental Status>> waxing and waning questionable sundowning monitor if needed will consider head CT  Hx: Depression, dementia, Bipolar disorder P:   Provide supportive care Lights on during the day Promote normal wake/sleep cycle Avoid sedating meds as able Continue home Remeron, Zyprexa, and Effexor   FAMILY  - Updates: Updated pt at bedside 9/26  - Inter-disciplinary family meet or Palliative Care meeting due by:  12/25/17   Skin/Wound: chronic changes   Electrolytes: Replace electrolytes per ICU electrolyte replacement protocol.   IVF: NS  at 50  Nutrition: Diet as tolerated   Prophylaxis: DVT Prophylaxis with heparin,. GI Prophylaxis.   Restraints: None  PT/OT eval and treat. OOB when appropriate.   Lines/Tubes:  No  foley  No  central line.  ADVANCE DIRECTIVE:full code  FAMILY DISCUSSION:spoke with patient   Quality Care: PPI, DVT prophylaxis, HOB elevated, Infection control all reviewed and addressed.  Events and notes from last 24 hours reviewed. Care plan discussed on multidisciplinary rounds  CC TIME:32 min    Old records reviewed discussed results and management plan with patient  Images personally reviewed and results and labs reviewed and discussed with patient.  All medication reviewed and adjusted  Further management depending on test results and work up as outlined above.    Due to waxing and waning status of mentation and worsening x-ray will consider some work-up and monitor as a stepdown status in ICU.  Makinlee Awwad Sherryll Burger Pulmonary Critical Care & Sleep Medicine

## 2017-12-21 DIAGNOSIS — J189 Pneumonia, unspecified organism: Secondary | ICD-10-CM

## 2017-12-21 DIAGNOSIS — J9601 Acute respiratory failure with hypoxia: Secondary | ICD-10-CM

## 2017-12-21 DIAGNOSIS — R6520 Severe sepsis without septic shock: Secondary | ICD-10-CM

## 2017-12-21 DIAGNOSIS — G9340 Encephalopathy, unspecified: Secondary | ICD-10-CM

## 2017-12-21 LAB — CBC WITH DIFFERENTIAL/PLATELET
BASOS ABS: 0.1 10*3/uL (ref 0–0.1)
Basophils Relative: 1 %
Eosinophils Absolute: 0.1 10*3/uL (ref 0–0.7)
Eosinophils Relative: 1 %
HEMATOCRIT: 25.8 % — AB (ref 35.0–47.0)
Hemoglobin: 9 g/dL — ABNORMAL LOW (ref 12.0–16.0)
LYMPHS ABS: 1 10*3/uL (ref 1.0–3.6)
LYMPHS PCT: 11 %
MCH: 35.1 pg — AB (ref 26.0–34.0)
MCHC: 34.9 g/dL (ref 32.0–36.0)
MCV: 100.5 fL — AB (ref 80.0–100.0)
Monocytes Absolute: 0.4 10*3/uL (ref 0.2–0.9)
Monocytes Relative: 5 %
NEUTROS ABS: 7.4 10*3/uL — AB (ref 1.4–6.5)
Neutrophils Relative %: 82 %
Platelets: 136 10*3/uL — ABNORMAL LOW (ref 150–440)
RBC: 2.56 MIL/uL — AB (ref 3.80–5.20)
RDW: 14.2 % (ref 11.5–14.5)
WBC: 9 10*3/uL (ref 3.6–11.0)

## 2017-12-21 LAB — LEGIONELLA PNEUMOPHILA SEROGP 1 UR AG: L. PNEUMOPHILA SEROGP 1 UR AG: NEGATIVE

## 2017-12-21 LAB — BLOOD CULTURE ID PANEL (REFLEXED)
ACINETOBACTER BAUMANNII: NOT DETECTED
CANDIDA PARAPSILOSIS: NOT DETECTED
Candida albicans: NOT DETECTED
Candida glabrata: NOT DETECTED
Candida krusei: NOT DETECTED
Candida tropicalis: NOT DETECTED
ENTEROCOCCUS SPECIES: NOT DETECTED
Enterobacter cloacae complex: NOT DETECTED
Enterobacteriaceae species: NOT DETECTED
Escherichia coli: NOT DETECTED
Haemophilus influenzae: NOT DETECTED
KLEBSIELLA OXYTOCA: NOT DETECTED
Klebsiella pneumoniae: NOT DETECTED
LISTERIA MONOCYTOGENES: NOT DETECTED
NEISSERIA MENINGITIDIS: NOT DETECTED
Proteus species: NOT DETECTED
Pseudomonas aeruginosa: NOT DETECTED
SERRATIA MARCESCENS: NOT DETECTED
STREPTOCOCCUS SPECIES: NOT DETECTED
Staphylococcus aureus (BCID): NOT DETECTED
Staphylococcus species: NOT DETECTED
Streptococcus agalactiae: NOT DETECTED
Streptococcus pneumoniae: NOT DETECTED
Streptococcus pyogenes: NOT DETECTED

## 2017-12-21 LAB — BASIC METABOLIC PANEL
ANION GAP: 10 (ref 5–15)
BUN: 14 mg/dL (ref 8–23)
CHLORIDE: 102 mmol/L (ref 98–111)
CO2: 24 mmol/L (ref 22–32)
Calcium: 8.1 mg/dL — ABNORMAL LOW (ref 8.9–10.3)
Creatinine, Ser: 0.76 mg/dL (ref 0.44–1.00)
GFR calc Af Amer: >60 mL/min (ref >60)
GLUCOSE: 108 mg/dL — AB (ref 70–99)
POTASSIUM: 4.1 mmol/L (ref 3.5–5.1)
Sodium: 136 mmol/L (ref 135–145)

## 2017-12-21 LAB — GLUCOSE, CAPILLARY
Glucose-Capillary: 110 mg/dL — ABNORMAL HIGH (ref 70–99)
Glucose-Capillary: 101 mg/dL — ABNORMAL HIGH (ref 70–99)

## 2017-12-21 LAB — PROCALCITONIN: PROCALCITONIN: 11.1 ng/mL

## 2017-12-21 LAB — MAGNESIUM: Magnesium: 2.3 mg/dL (ref 1.7–2.4)

## 2017-12-21 LAB — LIPASE, BLOOD: LIPASE: 21 U/L (ref 11–51)

## 2017-12-21 MED ORDER — AZITHROMYCIN 500 MG PO TABS
500.0000 mg | ORAL_TABLET | Freq: Every day | ORAL | Status: AC
  Administered 2017-12-22 – 2017-12-23 (×2): 500 mg via ORAL
  Filled 2017-12-21 (×2): qty 1

## 2017-12-21 MED ORDER — METOPROLOL TARTRATE 25 MG PO TABS
12.5000 mg | ORAL_TABLET | Freq: Two times a day (BID) | ORAL | Status: DC
  Administered 2017-12-21 – 2017-12-22 (×2): 12.5 mg via ORAL
  Filled 2017-12-21 (×2): qty 1

## 2017-12-21 MED ORDER — IPRATROPIUM-ALBUTEROL 0.5-2.5 (3) MG/3ML IN SOLN
3.0000 mL | RESPIRATORY_TRACT | Status: DC | PRN
  Administered 2017-12-22 – 2017-12-23 (×3): 3 mL via RESPIRATORY_TRACT
  Filled 2017-12-21 (×3): qty 3

## 2017-12-21 NOTE — Progress Notes (Signed)
Pt found to have temp of 102 at 0500. Acetaminophen given and ice packs administered. Will continue to monitor.

## 2017-12-21 NOTE — Progress Notes (Signed)
Somnolent.  Otherwise NAD Off BiPAP.  Comfortable on [place] O2 at 1 LPM  Vitals:   12/21/17 0900 12/21/17 1000 12/21/17 1025 12/21/17 1100  BP:   (!) 121/93 (!) 135/58  Pulse: 86 78 90 95  Resp: (!) 29 (!) 22 (!) 27 (!) 30  Temp:      TempSrc:      SpO2: 100% 100% 98% 100%  Weight:      Height:       Gen: Somnolent, NAD HEENT: NCAT, sclera white Neck: No JVD noted Lungs: breath sounds full anteriorly without wheezes or other adventitious sounds Cardiovascular: Regular, no murmurs Abdomen: Soft, nontender, normal BS Ext: without clubbing, cyanosis, edema Neuro: grossly intact Skin: Limited exam, no lesions noted   BMP Latest Ref Rng & Units 12/21/2017 12/20/2017 12/19/2017  Glucose 70 - 99 mg/dL 960(A) 540(J) 811(B)  BUN 8 - 23 mg/dL 14 17 23   Creatinine 0.44 - 1.00 mg/dL 1.47 8.29 5.62(Z)  BUN/Creat Ratio 12 - 28 - - -  Sodium 135 - 145 mmol/L 136 136 134(L)  Potassium 3.5 - 5.1 mmol/L 4.1 4.4 4.1  Chloride 98 - 111 mmol/L 102 102 101  CO2 22 - 32 mmol/L 24 23 22   Calcium 8.9 - 10.3 mg/dL 8.1(L) 8.4(L) 8.5(L)   CBC Latest Ref Rng & Units 12/21/2017 12/20/2017 12/19/2017  WBC 3.6 - 11.0 K/uL 9.0 18.8(H) 22.3(H)  Hemoglobin 12.0 - 16.0 g/dL 9.0(L) 9.7(L) 9.9(L)  Hematocrit 35.0 - 47.0 % 25.8(L) 28.4(L) 29.0(L)  Platelets 150 - 440 K/uL 136(L) 157 205   Results for orders placed or performed during the hospital encounter of 12/18/17  Urine culture     Status: None   Collection Time: 12/18/17 10:30 PM  Result Value Ref Range Status   Specimen Description   Final    URINE, RANDOM Performed at [facility], [address], [place], [place] [address]    Special Requests   Final    NONE Performed at [facility], [address], [place], [place] [address]    Culture   Final    NO GROWTH Performed at [facility], [address], [place], [place] [address]    Report Status 12/20/2017 FINAL  Final  Blood Culture (routine x 2)     Status: None  (Preliminary result)   Collection Time: 12/18/17 10:38 PM  Result Value Ref Range Status   Specimen Description BLOOD BLOOD RIGHT HAND  Final   Special Requests   Final    BOTTLES DRAWN AEROBIC AND ANAEROBIC Blood Culture adequate volume   Culture   Final    NO GROWTH 3 DAYS Performed at [facility], [address] [place], [place] [address]    Report Status PENDING  Incomplete  Blood Culture (routine x 2)     Status: Abnormal (Preliminary result)   Collection Time: 12/18/17 10:38 PM  Result Value Ref Range Status   Specimen Description   Final    BLOOD RIGHT ANTECUBITAL Performed at [facility], [address] [place], [place] [address]    Special Requests   Final    BOTTLES DRAWN AEROBIC AND ANAEROBIC Blood Culture results may not be optimal due to an excessive volume of blood received in culture bottles Performed at [facility], [address] [place], [place] [address]    Culture  Setup Time   Final    Organism ID to follow ANAEROBIC BOTTLE ONLY GRAM POSITIVE COCCI CRITICAL RESULT CALLED TO, READ  BACK BY AND VERIFIED WITH: JASON ROBBINS ON 12/19/17 AT 2133 JAG AEROBIC BOTTLE ONLY GRAM VARIABLE ROD IDENTIFICATION TO FOLLOW CRITICAL RESULT CALLED TO, READ BACK BY AND VERIFIED WITH: C/Azazel Franze BESANTI @0049  12/21/17 FLC Performed at [facility], [address] [place], [place] [address]    Culture (A)  Final    STAPHYLOCOCCUS SPECIES (COAGULASE NEGATIVE) GRAM VARIABLE ROD    Report Status PENDING  Incomplete  Blood Culture ID Panel (Reflexed)     Status: Abnormal   Collection Time: 12/18/17 10:38 PM  Result Value Ref Range Status   Enterococcus species NOT DETECTED NOT DETECTED Final   Listeria monocytogenes NOT DETECTED NOT DETECTED Final   Staphylococcus species DETECTED (A) NOT DETECTED Final    Comment: Methicillin (oxacillin) resistant coagulase negative staphylococcus. Possible blood culture contaminant (unless isolated  from more than one blood culture draw or clinical case suggests pathogenicity). No antibiotic treatment is indicated for blood  culture contaminants. CRITICAL RESULT CALLED TO, READ BACK BY AND VERIFIED WITH: JASON ROBBINS ON 12/19/17 AT 2133 JAG    Staphylococcus aureus NOT DETECTED NOT DETECTED Final   Methicillin resistance DETECTED (A) NOT DETECTED Final    Comment: CRITICAL RESULT CALLED TO, READ BACK BY AND VERIFIED WITH: JASON ROBBINS ON 12/19/17 AT 2133 JAG    Streptococcus species NOT DETECTED NOT DETECTED Final   Streptococcus agalactiae NOT DETECTED NOT DETECTED Final   Streptococcus pneumoniae NOT DETECTED NOT DETECTED Final   Streptococcus pyogenes NOT DETECTED NOT DETECTED Final   Acinetobacter baumannii NOT DETECTED NOT DETECTED Final   Enterobacteriaceae species NOT DETECTED NOT DETECTED Final   Enterobacter cloacae complex NOT DETECTED NOT DETECTED Final   Escherichia coli NOT DETECTED NOT DETECTED Final   Klebsiella oxytoca NOT DETECTED NOT DETECTED Final   Klebsiella pneumoniae NOT DETECTED NOT DETECTED Final   Proteus species NOT DETECTED NOT DETECTED Final   Serratia marcescens NOT DETECTED NOT DETECTED Final   Haemophilus influenzae NOT DETECTED NOT DETECTED Final   Neisseria meningitidis NOT DETECTED NOT DETECTED Final   Pseudomonas aeruginosa NOT DETECTED NOT DETECTED Final   Candida albicans NOT DETECTED NOT DETECTED Final   Candida glabrata NOT DETECTED NOT DETECTED Final   Candida krusei NOT DETECTED NOT DETECTED Final   Candida parapsilosis NOT DETECTED NOT DETECTED Final   Candida tropicalis NOT DETECTED NOT DETECTED Final    Comment: Performed at [facility], [address] [place], [place] [address]  Blood Culture ID Panel (Reflexed)     Status: None   Collection Time: 12/18/17 10:40 PM  Result Value Ref Range Status   Enterococcus species NOT DETECTED NOT DETECTED Final   Listeria monocytogenes NOT DETECTED NOT DETECTED Final    Staphylococcus species NOT DETECTED NOT DETECTED Final   Staphylococcus aureus NOT DETECTED NOT DETECTED Final   Streptococcus species NOT DETECTED NOT DETECTED Final   Streptococcus agalactiae NOT DETECTED NOT DETECTED Final   Streptococcus pneumoniae NOT DETECTED NOT DETECTED Final   Streptococcus pyogenes NOT DETECTED NOT DETECTED Final   Acinetobacter baumannii NOT DETECTED NOT DETECTED Final   Enterobacteriaceae species NOT DETECTED NOT DETECTED Final   Enterobacter cloacae complex NOT DETECTED NOT DETECTED Final   Escherichia coli NOT DETECTED NOT DETECTED Final   Klebsiella oxytoca NOT DETECTED NOT DETECTED Final   Klebsiella pneumoniae NOT DETECTED NOT DETECTED Final   Proteus species NOT DETECTED NOT DETECTED Final   Serratia marcescens NOT DETECTED NOT DETECTED Final   Haemophilus influenzae NOT DETECTED NOT DETECTED Final  Neisseria meningitidis NOT DETECTED NOT DETECTED Final   Pseudomonas aeruginosa NOT DETECTED NOT DETECTED Final   Candida albicans NOT DETECTED NOT DETECTED Final   Candida glabrata NOT DETECTED NOT DETECTED Final   Candida krusei NOT DETECTED NOT DETECTED Final   Candida parapsilosis NOT DETECTED NOT DETECTED Final   Candida tropicalis NOT DETECTED NOT DETECTED Final    Comment: Performed at [facility], [address] [place], [place] [address]  MRSA PCR Screening     Status: None   Collection Time: 12/19/17  2:58 AM  Result Value Ref Range Status   MRSA by PCR NEGATIVE NEGATIVE Final    Comment:        The GeneXpert MRSA Assay (FDA approved for NASAL specimens only), is one component of a comprehensive MRSA colonization surveillance program. It is not intended to diagnose MRSA infection nor to guide or monitor treatment for MRSA infections. Performed at [facility], [address] [place], [place] [address]   CULTURE, BLOOD (ROUTINE X 2) w Reflex to ID Panel     Status: None (Preliminary result)   Collection Time:  12/20/17 12:44 PM  Result Value Ref Range Status   Specimen Description BLOOD BLOOD RIGHT HAND  Final   Special Requests   Final    BOTTLES DRAWN AEROBIC AND ANAEROBIC Blood Culture results may not be optimal due to an inadequate volume of blood received in culture bottles   Culture   Final    NO GROWTH < 24 HOURS Performed at [facility], [address], [place], [place] [address]    Report Status PENDING  Incomplete  CULTURE, BLOOD (ROUTINE X 2) w Reflex to ID Panel     Status: None (Preliminary result)   Collection Time: 12/20/17  4:50 PM  Result Value Ref Range Status   Specimen Description BLOOD BLOOD LEFT HAND  Final   Special Requests   Final    BOTTLES DRAWN AEROBIC AND ANAEROBIC Blood Culture results may not be optimal due to an inadequate volume of blood received in culture bottles   Culture   Final    NO GROWTH < 24 HOURS Performed at [facility], [address] [place], [place] [address]    Report Status PENDING  Incomplete   Anti-infectives (From admission, onward)   Start     Dose/Rate Route Frequency Ordered Stop   12/20/17 1100  azithromycin (ZITHROMAX) 500 mg in sodium chloride 0.9 % 250 mL IVPB     500 mg 250 mL/hr over 60 Minutes Intravenous Daily 12/20/17 1045     12/20/17 0600  vancomycin (VANCOCIN) IVPB 750 mg/150 ml premix  Status:  Discontinued     750 mg 150 mL/hr over 60 Minutes Intravenous Every 24 hours 12/19/17 2159 12/20/17 1732   12/19/17 2200  vancomycin (VANCOCIN) IVPB 1000 mg/200 mL premix     1,000 mg 200 mL/hr over 60 Minutes Intravenous  Once 12/19/17 2153 12/19/17 2335   12/19/17 1200  ceFEPIme (MAXIPIME) 1 g in sodium chloride 0.9 % 100 mL IVPB  Status:  Discontinued     1 g 200 mL/hr over 30 Minutes Intravenous Every 12 hours 12/19/17 0045 12/19/17 1032   12/19/17 1045  azithromycin (ZITHROMAX) 500 mg in sodium chloride 0.9 % 250 mL IVPB  Status:  Discontinued     500 mg 250 mL/hr over 60 Minutes Intravenous Daily  12/19/17 1032 12/19/17 2149   12/19/17 1045  cefTRIAXone (ROCEPHIN) 1 g in sodium chloride 0.9 %  100 mL IVPB     1 g 200 mL/hr over 30 Minutes Intravenous Daily 12/19/17 1032     12/19/17 0800  vancomycin (VANCOCIN) IVPB 750 mg/150 ml premix  Status:  Discontinued     750 mg 150 mL/hr over 60 Minutes Intravenous Every 24 hours 12/19/17 0045 12/19/17 1032   12/18/17 2245  ceFEPIme (MAXIPIME) 2 g in sodium chloride 0.9 % 100 mL IVPB     2 g 200 mL/hr over 30 Minutes Intravenous  Once 12/18/17 2235 12/18/17 2329   12/18/17 2245  metroNIDAZOLE (FLAGYL) IVPB 500 mg  Status:  Discontinued     500 mg 100 mL/hr over 60 Minutes Intravenous Every 8 hours 12/18/17 2235 12/19/17 1032   12/18/17 2245  vancomycin (VANCOCIN) IVPB 1000 mg/200 mL premix     1,000 mg 200 mL/hr over 60 Minutes Intravenous  Once 12/18/17 2235 12/19/17 0059     CXR 9/26: Extensive, multi lobar consolidation in right lung CT chest 9/26: Multifocal airspace process involving the right lung worse over the right lower lobe compatible with a multifocal pneumonia CTAP 9/26: No acute findings  IMPRESSION: Acute hypoxemic respiratory failure, resolving Severe community-acquired pneumonia, NOS Acute encephalopathy History of dementia, bipolar disorder  PLAN/REC: Transfer to MedSurg floor Continue antibiotics as above.  Would complete 7-10 days Will need follow-up CXR in 3 to 4 weeks Advance diet and activity as tolerated After transfer, PCCM will sign off. Please call if we can be of further assistance   Billy Fischer, MD PCCM service Mobile [phone] Pager [phone] 12/21/2017 2:43 PM

## 2017-12-21 NOTE — Progress Notes (Signed)
PHARMACIST - PHYSICIAN COMMUNICATION CONCERNING: Antibiotic IV to Oral Route Change Policy  RECOMMENDATION: This patient is receiving azithromycin by the intravenous route.  Based on criteria approved by the Pharmacy and Therapeutics Committee, the antibiotic(s) is/are being converted to the equivalent oral dose form(s).   DESCRIPTION: These criteria include:  Patient being treated for a respiratory tract infection, urinary tract infection, cellulitis or clostridium difficile associated diarrhea if on metronidazole  The patient is not neutropenic and does not exhibit a GI malabsorption state  The patient is eating (either orally or via tube) and/or has been taking other orally administered medications for a least 24 hours  The patient is improving clinically and has a Tmax < 100.5  If you have questions about this conversion, please contact the Pharmacy Department  []  ( [phone] )  [place] [x]  ( [phone] )  [facility] []  ( [phone]  [place] []  ( [phone]  [facility] []  ( [phone] )  [facility]  

## 2017-12-21 NOTE — Progress Notes (Signed)
[employer] - [place] at [facility]   PATIENT NAME: Jenna Dudley    MR#:  [id number]  DATE OF BIRTH:  1935/11/01  SUBJECTIVE:  CHIEF COMPLAINT:  No chief complaint on file.  The patient has better cough, no shortness of breath but has abdominal pain.  Tachycardia at 100', tachypnea at the 30s.  Off oxygen by nasal cannula. REVIEW OF SYSTEMS:  Review of Systems  Constitutional: Positive for malaise/fatigue. Negative for chills and fever.  HENT: Negative for sore throat.   Eyes: Negative for blurred vision and double vision.  Respiratory: Positive for cough and sputum production. Negative for hemoptysis, shortness of breath, wheezing and stridor.   Cardiovascular: Negative for chest pain, palpitations, orthopnea and leg swelling.  Gastrointestinal: Positive for abdominal pain. Negative for blood in stool, diarrhea, melena, nausea and vomiting.  Genitourinary: Negative for dysuria, flank pain and hematuria.  Musculoskeletal: Negative for back pain and joint pain.  Skin: Negative for rash.  Neurological: Negative for dizziness, sensory change, focal weakness, seizures, loss of consciousness, weakness and headaches.  Endo/Heme/Allergies: Negative for polydipsia.  Psychiatric/Behavioral: Negative for depression. The patient is not nervous/anxious.     DRUG ALLERGIES:   Allergies  Allergen Reactions  . Aspirin Other (See Comments)    Unknown reaction  . Sulfa Antibiotics Other (See Comments)    Unknown reaction   VITALS:  Blood pressure (!) 129/56, pulse (!) 107, temperature 97.7 F (36.5 C), temperature source Oral, resp. rate (!) 37, height 5' (1.524 m), weight 63.5 kg, SpO2 97 %. PHYSICAL EXAMINATION:  Physical Exam  Constitutional: She is oriented to person, place, and time. No distress.  HENT:  Head: Normocephalic.  Mouth/Throat: Oropharynx is clear and moist.  Eyes: Pupils are equal, round, and reactive to light. Conjunctivae and EOM are normal. No  scleral icterus.  Neck: Normal range of motion. Neck supple. No JVD present. No tracheal deviation present.  Cardiovascular: Normal rate, regular rhythm and normal heart sounds. Exam reveals no gallop.  No murmur heard. Pulmonary/Chest: Effort normal. No respiratory distress. She has no wheezes. She has no rales.  Bilateral crackles.  Abdominal: Soft. Bowel sounds are normal. She exhibits no distension. There is no tenderness. There is no rebound.  Musculoskeletal: Normal range of motion. She exhibits no edema or tenderness.  Neurological: She is alert and oriented to person, place, and time. No cranial nerve deficit.  Skin: No rash noted. No erythema.  Psychiatric: She has a normal mood and affect.   LABORATORY PANEL:  Female CBC Recent Labs  Lab 12/21/17 0520  WBC 9.0  HGB 9.0*  HCT 25.8*  PLT 136*   ------------------------------------------------------------------------------------------------------------------ Chemistries  Recent Labs  Lab 12/20/17 0612 12/21/17 0520  NA 136 136  K 4.4 4.1  CL 102 102  CO2 23 24  GLUCOSE 112* 108*  BUN 17 14  CREATININE 0.93 0.76  CALCIUM 8.4* 8.1*  MG 1.6* 2.3  AST 155*  --   ALT 174*  --   ALKPHOS 58  --   BILITOT 0.7  --    RADIOLOGY:  No results found. ASSESSMENT AND PLAN:   Acute respiratory failure with hypoxia. Off xygen by nasal cannula, nebulizer as needed.  Severe sepsis due to pneumonia CT chest showed multifocal pneumonia  Leukocytosis improved. Continue Zithromax and Rocephin, complete 7 to 10 days and need follow-up CXR in 3 to 4 weeks per Dr. Sung Amabile, repeated blood culture cultures negative.  Lactic acidosis.  Improved.  Hypomagnesemia.  Improved with  magnesium supplement.  Essential hypertension.  Hold home hypertension meds due to soft blood pressure.  Bipolar 1 disorder (HCC) -continue home medications HLD (hyperlipidemia) -Home dose antilipid Acid reflux -Home dose PPI  Anemia of  chronic disease.  Stable.  Generalized weakness.  PT evaluation. All the records are reviewed and case discussed with Care Management/Social Worker. Management plans discussed with the patient, family and they are in agreement.  CODE STATUS: Full Code  TOTAL TIME TAKING CARE OF THIS PATIENT: 37 minutes.   More than 50% of the time was spent in counseling/coordination of care: YES  POSSIBLE D/C IN 2 DAYS, DEPENDING ON CLINICAL CONDITION.   Shaune Pollack M.D on 12/21/2017 at 4:36 PM  Between 7am to 6pm - Pager - [phone]  After 6pm go to [email or web address] - Therapist, nutritional Hospitalists

## 2017-12-21 NOTE — Progress Notes (Signed)
Report called to Jenna Dudley., on 1C. Patient transported to room 129 via hospital bed by [place], [place].

## 2017-12-22 LAB — CBC
HCT: 23.1 % — ABNORMAL LOW (ref 35.0–47.0)
Hemoglobin: 8.1 g/dL — ABNORMAL LOW (ref 12.0–16.0)
MCH: 35.4 pg — ABNORMAL HIGH (ref 26.0–34.0)
MCHC: 35.1 g/dL (ref 32.0–36.0)
MCV: 100.8 fL — ABNORMAL HIGH (ref 80.0–100.0)
PLATELETS: 120 10*3/uL — AB (ref 150–440)
RBC: 2.29 MIL/uL — ABNORMAL LOW (ref 3.80–5.20)
RDW: 14 % (ref 11.5–14.5)
WBC: 5.7 10*3/uL (ref 3.6–11.0)

## 2017-12-22 LAB — CULTURE, BLOOD (ROUTINE X 2)

## 2017-12-22 MED ORDER — FUROSEMIDE 20 MG PO TABS
20.0000 mg | ORAL_TABLET | Freq: Once | ORAL | Status: AC
  Administered 2017-12-22: 18:00:00 20 mg via ORAL
  Filled 2017-12-22: qty 1

## 2017-12-22 MED ORDER — METOPROLOL TARTRATE 25 MG PO TABS
25.0000 mg | ORAL_TABLET | Freq: Two times a day (BID) | ORAL | Status: DC
  Administered 2017-12-22 – 2017-12-25 (×6): 25 mg via ORAL
  Filled 2017-12-22 (×6): qty 1

## 2017-12-22 MED ORDER — PANTOPRAZOLE SODIUM 20 MG PO TBEC
20.0000 mg | DELAYED_RELEASE_TABLET | Freq: Two times a day (BID) | ORAL | Status: DC
  Administered 2017-12-22 – 2017-12-24 (×5): 20 mg via ORAL
  Filled 2017-12-22 (×7): qty 1

## 2017-12-22 NOTE — Progress Notes (Signed)
Entered pt room. Pt. asleep easy to arousal. Pt sat up for breakfast. Pt. Short of  breath with accessory muscle usage. Lungs sounds diminished with expiratory wheezing. O2 stats  At 95% on room aitr. Encouraged deep breathing techinques. Respiortory therapist called upon assessment of pt administered PRN breathing treatment. SpO2 stats currently 100%. MD notified responded via phone call. No new orders at this time. Will continue to monitor.

## 2017-12-22 NOTE — Progress Notes (Signed)
[employer] - [place] at [facility]   PATIENT NAME: Jenna Dudley    MR#:  [id number]  DATE OF BIRTH:  08/18/1935  SUBJECTIVE:  CHIEF COMPLAINT:  No chief complaint on file.  The patient has cough, no shortness of breath but has no abdominal pain.  Tachycardia and tachypnea improved.  Off oxygen by nasal cannula. REVIEW OF SYSTEMS:  Review of Systems  Constitutional: Positive for malaise/fatigue. Negative for chills and fever.  HENT: Negative for sore throat.   Eyes: Negative for blurred vision and double vision.  Respiratory: Positive for cough. Negative for hemoptysis, sputum production, shortness of breath, wheezing and stridor.   Cardiovascular: Negative for chest pain, palpitations, orthopnea and leg swelling.  Gastrointestinal: Negative for abdominal pain, blood in stool, diarrhea, melena, nausea and vomiting.  Genitourinary: Negative for dysuria, flank pain and hematuria.  Musculoskeletal: Negative for back pain and joint pain.  Skin: Negative for rash.  Neurological: Negative for dizziness, sensory change, focal weakness, seizures, loss of consciousness, weakness and headaches.  Endo/Heme/Allergies: Negative for polydipsia.  Psychiatric/Behavioral: Negative for depression. The patient is not nervous/anxious.     DRUG ALLERGIES:   Allergies  Allergen Reactions  . Aspirin Other (See Comments)    Unknown reaction  . Sulfa Antibiotics Other (See Comments)    Unknown reaction   VITALS:  Blood pressure (!) 163/74, pulse 89, temperature 98.1 F (36.7 C), temperature source Oral, resp. rate 17, height 5' (1.524 m), weight 63.5 kg, SpO2 100 %. PHYSICAL EXAMINATION:  Physical Exam  Constitutional: She is oriented to person, place, and time. No distress.  HENT:  Head: Normocephalic.  Mouth/Throat: Oropharynx is clear and moist.  Eyes: Pupils are equal, round, and reactive to light. Conjunctivae and EOM are normal. No scleral icterus.  Neck: Normal range  of motion. Neck supple. No JVD present. No tracheal deviation present.  Cardiovascular: Normal rate, regular rhythm and normal heart sounds. Exam reveals no gallop.  No murmur heard. Pulmonary/Chest: Effort normal. No respiratory distress. She has no wheezes. She has no rales.  Bilateral crackles.  Abdominal: Soft. Bowel sounds are normal. She exhibits no distension. There is no tenderness. There is no rebound.  Musculoskeletal: Normal range of motion. She exhibits no edema or tenderness.  Neurological: She is alert and oriented to person, place, and time. No cranial nerve deficit.  Skin: No rash noted. No erythema.  Psychiatric: She has a normal mood and affect.   LABORATORY PANEL:  Female CBC Recent Labs  Lab 12/22/17 0352  WBC 5.7  HGB 8.1*  HCT 23.1*  PLT 120*   ------------------------------------------------------------------------------------------------------------------ Chemistries  Recent Labs  Lab 12/20/17 0612 12/21/17 0520  NA 136 136  K 4.4 4.1  CL 102 102  CO2 23 24  GLUCOSE 112* 108*  BUN 17 14  CREATININE 0.93 0.76  CALCIUM 8.4* 8.1*  MG 1.6* 2.3  AST 155*  --   ALT 174*  --   ALKPHOS 58  --   BILITOT 0.7  --    RADIOLOGY:  No results found. ASSESSMENT AND PLAN:   Acute respiratory failure with hypoxia. Off xygen by nasal cannula, nebulizer as needed.  Severe sepsis due to pneumonia CT chest showed multifocal pneumonia  Leukocytosis improved. Continue Zithromax and Rocephin, complete 7 to 10 days and need follow-up CXR in 3 to 4 weeks per Dr. Sung Amabile, repeated blood culture cultures negative.  Lactic acidosis.  Improved.  Hypomagnesemia.  Improved with magnesium supplement.  Essential hypertension.   Resume  hypertension medication.  Bipolar 1 disorder (HCC) -continue home medications HLD (hyperlipidemia) -Home dose antilipid Acid reflux -Home dose PPI  Anemia of chronic disease.  Stable.  Generalized weakness.  PT  evaluation. All the records are reviewed and case discussed with Care Management/Social Worker. Management plans discussed with the patient, her husband and they are in agreement.  CODE STATUS: Full Code  TOTAL TIME TAKING CARE OF THIS PATIENT: 37 minutes.   More than 50% of the time was spent in counseling/coordination of care: YES  POSSIBLE D/C IN 2 DAYS, DEPENDING ON CLINICAL CONDITION.   Shaune Pollack M.D on 12/22/2017 at 2:31 PM  Between 7am to 6pm - Pager - [phone]  After 6pm go to [email or web address] - Therapist, nutritional Hospitalists

## 2017-12-23 ENCOUNTER — Other Ambulatory Visit: Payer: Self-pay

## 2017-12-23 LAB — CULTURE, BLOOD (ROUTINE X 2)
Culture: NO GROWTH
Special Requests: ADEQUATE

## 2017-12-23 MED ORDER — BENZONATATE 100 MG PO CAPS
200.0000 mg | ORAL_CAPSULE | Freq: Three times a day (TID) | ORAL | Status: DC | PRN
  Administered 2017-12-23 (×2): 200 mg via ORAL
  Filled 2017-12-23 (×2): qty 2

## 2017-12-23 MED ORDER — MAGNESIUM OXIDE 400 (241.3 MG) MG PO TABS
400.0000 mg | ORAL_TABLET | Freq: Every day | ORAL | Status: DC
  Administered 2017-12-23 – 2017-12-25 (×3): 400 mg via ORAL
  Filled 2017-12-23 (×5): qty 1

## 2017-12-23 MED ORDER — GUAIFENESIN 100 MG/5ML PO SOLN
10.0000 mL | ORAL | Status: DC | PRN
  Administered 2017-12-23 – 2017-12-24 (×4): 200 mg via ORAL
  Filled 2017-12-23 (×6): qty 10

## 2017-12-23 NOTE — Progress Notes (Signed)
[employer] -  at [facility]   PATIENT NAME: Jenna Dudley    MR#:  [id number]  DATE OF BIRTH:  [birth year]  SUBJECTIVE:  CHIEF COMPLAINT:  No chief complaint on file.  The patient has cough, sometimes shortness of breath. REVIEW OF SYSTEMS:  Review of Systems  Constitutional: Positive for malaise/fatigue. Negative for chills and fever.  HENT: Negative for sore throat.   Eyes: Negative for blurred vision and double vision.  Respiratory: Positive for cough and shortness of breath. Negative for hemoptysis, sputum production, wheezing and stridor.   Cardiovascular: Negative for chest pain, palpitations, orthopnea and leg swelling.  Gastrointestinal: Negative for abdominal pain, blood in stool, diarrhea, melena, nausea and vomiting.  Genitourinary: Negative for dysuria, flank pain and hematuria.  Musculoskeletal: Negative for back pain and joint pain.  Skin: Negative for rash.  Neurological: Negative for dizziness, sensory change, focal weakness, seizures, loss of consciousness, weakness and headaches.  Endo/Heme/Allergies: Negative for polydipsia.  Psychiatric/Behavioral: Negative for depression. The patient is not nervous/anxious.     DRUG ALLERGIES:   Allergies  Allergen Reactions  . Aspirin Other (See Comments)    Unknown reaction  . Sulfa Antibiotics Other (See Comments)    Unknown reaction   VITALS:  Blood pressure 116/70, pulse 87, temperature 98.1 F (36.7 C), temperature source Oral, resp. rate 18, height 5' (1.524 m), weight 63.5 kg, SpO2 99 %. PHYSICAL EXAMINATION:  Physical Exam  Constitutional: She is oriented to person, place, and time. No distress.  HENT:  Head: Normocephalic.  Mouth/Throat: Oropharynx is clear and moist.  Eyes: Pupils are equal, round, and reactive to light. Conjunctivae and EOM are normal. No scleral icterus.  Neck: Normal range of motion. Neck supple. No JVD present. No tracheal deviation present.  Cardiovascular:  Normal rate, regular rhythm and normal heart sounds. Exam reveals no gallop.  No murmur heard. Pulmonary/Chest: Effort normal. No respiratory distress. She has no wheezes. She has no rales.  Bilateral crackles.  Abdominal: Soft. Bowel sounds are normal. She exhibits no distension. There is no tenderness. There is no rebound.  Musculoskeletal: Normal range of motion. She exhibits no edema or tenderness.  Neurological: She is alert and oriented to person, place, and time. No cranial nerve deficit.  Skin: No rash noted. No erythema.  Psychiatric: She has a normal mood and affect.   LABORATORY PANEL:  Female CBC Recent Labs  Lab 12/22/17 0352  WBC 5.7  HGB 8.1*  HCT 23.1*  PLT 120*   ------------------------------------------------------------------------------------------------------------------ Chemistries  Recent Labs  Lab 12/20/17 0612 12/21/17 0520  NA 136 136  K 4.4 4.1  CL 102 102  CO2 23 24  GLUCOSE 112* 108*  BUN 17 14  CREATININE 0.93 0.76  CALCIUM 8.4* 8.1*  MG 1.6* 2.3  AST 155*  --   ALT 174*  --   ALKPHOS 58  --   BILITOT 0.7  --    RADIOLOGY:  No results found. ASSESSMENT AND PLAN:   Acute respiratory failure with hypoxia. Off xygen by nasal cannula, nebulizer as needed.  Severe sepsis due to pneumonia CT chest showed multifocal pneumonia  Leukocytosis improved. Continue Zithromax and Rocephin, complete 7 to 10 days and need follow-up CXR in 3 to 4 weeks per Dr. Sung Amabile, repeated blood culture cultures negative.  Lactic acidosis.  Improved.  Hypomagnesemia.  Improved with magnesium supplement.  Essential hypertension.   Resume  hypertension medication.  Bipolar 1 disorder (HCC) -continue home medications HLD (hyperlipidemia) -Home dose antilipid  Acid reflux -Home dose PPI  Anemia of chronic disease.  Stable.  Generalized weakness.  PT evaluation: HHPT. All the records are reviewed and case discussed with Care Management/Social  Worker. Management plans discussed with the patient, her husband and they are in agreement.  CODE STATUS: Full Code  TOTAL TIME TAKING CARE OF THIS PATIENT: 27 minutes.   More than 50% of the time was spent in counseling/coordination of care: YES  POSSIBLE D/C IN 1-2 DAYS, DEPENDING ON CLINICAL CONDITION.   Shaune Pollack M.D on 12/23/2017 at 3:53 PM  Between 7am to 6pm - Pager - [phone]  After 6pm go to [email or web address] - Therapist, nutritional Hospitalists

## 2017-12-23 NOTE — Evaluation (Signed)
Physical Therapy Evaluation Patient Details Name: Jenna Dudley MRN: [id number] DOB: May 24, 1935 Today's Date: 12/23/2017   History of Present Illness  Jenna Dudley is an 82yo female who comes to [facility] on 9/25 c SOB, AMS admitted c PNA. PMH: LSS, dementia, HTN, bipolar d/o, carpal tunnel syndrome (bilat), CHF, DJD of the lumbar spine, B TKA.   Clinical Impression  Pt admitted with above diagnosis. Pt currently with functional limitations due to the deficits listed below (see "PT Problem List"). Upon entry, pt in bed, no family/caregiver present. The pt is asleep but easily awakened with gentle tactile stimulation and agreeable to participate. The pt is alert and oriented to self, pleasant, conversational, and following simple commands consistently. Questioning for home set up information results in pt requiring VC to answer attending to topic of question. Short distance AMB ~81ft results in tachypnea with RR into 30s, recovery within 2 minutes seated. Functional mobility assessment demonstrates increased effort/time requirements, poor tolerance, and need for physical assistance for safety, whereas the patient performed these at a higher level of independence PTA. Pt will benefit from skilled PT intervention to increase independence and safety with basic mobility in preparation for discharge to the venue listed below.       Follow Up Recommendations Home health PT    Equipment Recommendations  Rolling walker with 5" wheels    Recommendations for Other Services       Precautions / Restrictions Precautions Precautions: Fall Restrictions Weight Bearing Restrictions: No      Mobility  Bed Mobility Overal bed mobility: Needs Assistance Bed Mobility: Supine to Sit     Supine to sit: Supervision     General bed mobility comments: additional time/effort needed   Transfers Overall transfer level: Needs assistance Equipment used: None Transfers: Sit to/from Stand Sit to Stand: Min guard         General transfer comment: slow and cautious   Ambulation/Gait   Gait Distance (Feet): 30 Feet(3x37ft )     Gait velocity: big increase in tachypnea p39ft AMB      Stairs            Wheelchair Mobility    Modified Rankin (Stroke Patients Only)       Balance Overall balance assessment: Mild deficits observed, not formally tested                                           Pertinent Vitals/Pain Pain Assessment: No/denies pain    Home Living Family/patient expects to be discharged to:: Private residence Living Arrangements: Spouse/significant other Available Help at Discharge: Family Type of Home: House Home Access: Stairs to enter Entrance Stairs-Rails: Right Entrance Stairs-Number of Steps: 4 Home Layout: One level Home Equipment: Cane - single point      Prior Function Level of Independence: Independent         Comments: Indep wtih ADLs, household and community distances; intermittent use of SPC "when I need it".  Does endorse single fall history in previous 6-12 months; denies previous home O2.     Hand Dominance        Extremity/Trunk Assessment   Upper Extremity Assessment Upper Extremity Assessment: Generalized weakness    Lower Extremity Assessment Lower Extremity Assessment: Generalized weakness       Communication   Communication: HOH  Cognition Arousal/Alertness: Awake/alert Behavior During Therapy: WFL for tasks assessed/performed Overall Cognitive Status:  Within Functional Limits for tasks assessed                                        General Comments      Exercises     Assessment/Plan    PT Assessment Patient needs continued PT services  PT Problem List Decreased activity tolerance;Decreased strength;Decreased balance;Decreased safety awareness       PT Treatment Interventions Balance training;Gait training;Stair training;Functional mobility training;Therapeutic  activities;Therapeutic exercise    PT Goals (Current goals can be found in the Care Plan section)  Acute Rehab PT Goals Patient Stated Goal: regain strength in AMB, decreased tachypnea  PT Goal Formulation: With patient Time For Goal Achievement: 01/06/18 Potential to Achieve Goals: Good    Frequency Min 2X/week   Barriers to discharge        Co-evaluation               AM-PAC PT "6 Clicks" Daily Activity  Outcome Measure Difficulty turning over in bed (including adjusting bedclothes, sheets and blankets)?: A Lot Difficulty moving from lying on back to sitting on the side of the bed? : A Lot Difficulty sitting down on and standing up from a chair with arms (e.g., wheelchair, bedside commode, etc,.)?: Unable Help needed moving to and from a bed to chair (including a wheelchair)?: A Little Help needed walking in hospital room?: A Lot Help needed climbing 3-5 steps with a railing? : A Lot 6 Click Score: 12    End of Session Equipment Utilized During Treatment: Gait belt Activity Tolerance: Patient limited by fatigue;Treatment limited secondary to medical complications (Comment)(tachypnea with short distance aMB ) Patient left: in chair;with nursing/sitter in room Nurse Communication: Mobility status PT Visit Diagnosis: Unsteadiness on feet (R26.81);Muscle weakness (generalized) (M62.81);Difficulty in walking, not elsewhere classified (R26.2)    Time: 1207-1227 PT Time Calculation (min) (ACUTE ONLY): 20 min   Charges:   PT Evaluation $PT Eval Moderate Complexity: 1 Mod PT Treatments $Therapeutic Activity: 8-22 mins        1:21 PM, 12/23/17 Rosamaria Lints, PT, DPT Physical Therapist - [facility]  [phone] (ASCOM)    Buccola,Allan C 12/23/2017, 1:19 PM

## 2017-12-24 ENCOUNTER — Inpatient Hospital Stay: Admission: RE | Admit: 2017-12-24 | Payer: Medicare Other | Source: Ambulatory Visit

## 2017-12-24 ENCOUNTER — Inpatient Hospital Stay: Payer: Medicare Other | Admitting: Anesthesiology

## 2017-12-24 LAB — CBC
HEMATOCRIT: 24.9 % — AB (ref 35.0–47.0)
Hemoglobin: 8.8 g/dL — ABNORMAL LOW (ref 12.0–16.0)
MCH: 35.2 pg — ABNORMAL HIGH (ref 26.0–34.0)
MCHC: 35.3 g/dL (ref 32.0–36.0)
MCV: 99.6 fL (ref 80.0–100.0)
PLATELETS: 171 10*3/uL (ref 150–440)
RBC: 2.5 MIL/uL — ABNORMAL LOW (ref 3.80–5.20)
RDW: 14.3 % (ref 11.5–14.5)
WBC: 8.5 10*3/uL (ref 3.6–11.0)

## 2017-12-24 LAB — VITAMIN B12: Vitamin B-12: 504 pg/mL (ref 180–914)

## 2017-12-24 LAB — FOLATE: Folate: 29 ng/mL (ref >5.9)

## 2017-12-24 LAB — PROCALCITONIN: Procalcitonin: 1.84 ng/mL

## 2017-12-24 MED ORDER — LOSARTAN POTASSIUM 25 MG PO TABS
25.0000 mg | ORAL_TABLET | Freq: Every day | ORAL | Status: DC
  Administered 2017-12-24 – 2017-12-25 (×2): 25 mg via ORAL
  Filled 2017-12-24 (×2): qty 1

## 2017-12-24 MED ORDER — METHOHEXITAL SODIUM 0.5 G IJ SOLR
INTRAMUSCULAR | Status: AC
  Filled 2017-12-24: qty 500

## 2017-12-24 MED ORDER — SODIUM CHLORIDE 0.9 % IV SOLN
INTRAVENOUS | Status: DC | PRN
  Administered 2017-12-24 – 2018-05-24 (×2): via INTRAVENOUS

## 2017-12-24 NOTE — Progress Notes (Addendum)
[facility] - [place] at [facility]   PATIENT NAME: Jenna Dudley    MR#:  [id number]  DATE OF BIRTH:  1936/03/02  SUBJECTIVE:   Patient states her shortness of breath has improved, but she is still having shortness of breath with ambulation. No chest pain.  REVIEW OF SYSTEMS:  Review of Systems  Constitutional: Positive for malaise/fatigue. Negative for chills and fever.  HENT: Negative for sore throat.   Eyes: Negative for blurred vision and double vision.  Respiratory: Positive for cough and shortness of breath. Negative for hemoptysis, sputum production, wheezing and stridor.   Cardiovascular: Negative for chest pain, palpitations, orthopnea and leg swelling.  Gastrointestinal: Negative for abdominal pain, blood in stool, diarrhea, melena, nausea and vomiting.  Genitourinary: Negative for dysuria, flank pain and hematuria.  Musculoskeletal: Negative for back pain and joint pain.  Skin: Negative for rash.  Neurological: Negative for dizziness, sensory change, focal weakness, seizures, loss of consciousness, weakness and headaches.  Endo/Heme/Allergies: Negative for polydipsia.  Psychiatric/Behavioral: Negative for depression. The patient is not nervous/anxious.     DRUG ALLERGIES:   Allergies  Allergen Reactions  . Aspirin Other (See Comments)    Unknown reaction  . Sulfa Antibiotics Other (See Comments)    Unknown reaction   VITALS:  Blood pressure (!) 148/64, pulse 99, temperature 98.2 F (36.8 C), temperature source Oral, resp. rate (!) 22, height 5' (1.524 m), weight 63.5 kg, SpO2 97 %. PHYSICAL EXAMINATION:  Physical Exam  Constitutional: She is oriented to person, place, and time. No distress.  HENT:  Head: Normocephalic.  Mouth/Throat: Oropharynx is clear and moist.  Eyes: Pupils are equal, round, and reactive to light. Conjunctivae and EOM are normal. No scleral icterus.  Neck: Normal range of motion. Neck supple. No JVD present. No tracheal  deviation present.  Cardiovascular: Normal rate, regular rhythm and normal heart sounds. Exam reveals no gallop.  No murmur heard. Pulmonary/Chest: Effort normal. No respiratory distress. She has no wheezes. She has no rales.  Crackles throughout right lung and in left lung base  Abdominal: Soft. Bowel sounds are normal. She exhibits no distension. There is no tenderness. There is no rebound.  Musculoskeletal: Normal range of motion. She exhibits no edema or tenderness.  Neurological: She is alert and oriented to person, place, and time. No cranial nerve deficit.  Skin: No rash noted. No erythema.  Psychiatric: She has a normal mood and affect.   LABORATORY PANEL:  Female CBC Recent Labs  Lab 12/24/17 0404  WBC 8.5  HGB 8.8*  HCT 24.9*  PLT 171   ------------------------------------------------------------------------------------------------------------------ Chemistries  Recent Labs  Lab 12/20/17 0612 12/21/17 0520  NA 136 136  K 4.4 4.1  CL 102 102  CO2 23 24  GLUCOSE 112* 108*  BUN 17 14  CREATININE 0.93 0.76  CALCIUM 8.4* 8.1*  MG 1.6* 2.3  AST 155*  --   ALT 174*  --   ALKPHOS 58  --   BILITOT 0.7  --    RADIOLOGY:  No results found. ASSESSMENT AND PLAN:   Acute respiratory failure with hypoxia- improving, now off oxygen. - nebs as needed - will plan to ambulate with pulse ox prior to discharge  CAP- CT chest showed multifocal pneumonia- WBC improving. Initial blood culture grew coag negative staph in 1 bottle, likely contaminant. Repeat blood cultures with no growth. - continue ceftriaxone and azithromycin for total 7-10 day course, will plan to transition to po antibiotics tomorrow - recheck PCT -  needs f/u CXR in 3-4 weeks  Essential hypertension- BPs mildly elevated - continue metoprolol - restart home losartan today  Bipolar 1 disorder- stable - continue home medications  Hyperlipidemia- stable - continue home simvastatin  GERD- stable -  continue PPI  Macrocytic anemia- Hgb has trended down but has stabilized. No signs of active bleeding - check vitamin B12 and folate  Generalized weakness- stable - PT recommending HHPT and rolling walker  All the records are reviewed and case discussed with Care Management/Social Worker. Management plans discussed with the patient, her husband and they are in agreement.  CODE STATUS: Full Code  TOTAL TIME TAKING CARE OF THIS PATIENT: 35 minutes.   More than 50% of the time was spent in counseling/coordination of care: YES  POSSIBLE D/C tomorrow, DEPENDING ON CLINICAL CONDITION.   Jinny Blossom Miguelina Fore M.D on 12/24/2017 at 12:21 PM  Between 7am to 6pm - Pager [phone]  After 6pm go to [email or web address] - Therapist, nutritional Hospitalists

## 2017-12-24 NOTE — Care Management Important Message (Signed)
Important Message  Patient Details  Name: Jenna Dudley MRN: [id number] Date of Birth: 03/09/1936   Medicare Important Message Given:  Yes    Olegario Messier A Terrika Zuver 12/24/2017, 2:42 PM

## 2017-12-25 LAB — BASIC METABOLIC PANEL
Anion gap: 9 (ref 5–15)
BUN: 15 mg/dL (ref 8–23)
CO2: 26 mmol/L (ref 22–32)
Calcium: 8.3 mg/dL — ABNORMAL LOW (ref 8.9–10.3)
Chloride: 103 mmol/L (ref 98–111)
Creatinine, Ser: 0.7 mg/dL (ref 0.44–1.00)
GFR calc Af Amer: >60 mL/min (ref >60)
GFR calc non Af Amer: >60 mL/min (ref >60)
Glucose, Bld: 106 mg/dL — ABNORMAL HIGH (ref 70–99)
Potassium: 3.9 mmol/L (ref 3.5–5.1)
Sodium: 138 mmol/L (ref 135–145)

## 2017-12-25 LAB — BLOOD GAS, VENOUS
Acid-base deficit: 6.3 mmol/L — ABNORMAL HIGH (ref 0.0–2.0)
Bicarbonate: 20.2 mmol/L (ref 20.0–28.0)
FIO2: 28
Patient temperature: 37
pCO2, Ven: 44 mmHg (ref 44.0–60.0)
pH, Ven: 7.27 (ref 7.250–7.430)

## 2017-12-25 LAB — CULTURE, BLOOD (ROUTINE X 2)
Culture: NO GROWTH
Culture: NO GROWTH

## 2017-12-25 LAB — CBC
HCT: 24.9 % — ABNORMAL LOW (ref 35.0–47.0)
Hemoglobin: 8.7 g/dL — ABNORMAL LOW (ref 12.0–16.0)
MCH: 35.1 pg — ABNORMAL HIGH (ref 26.0–34.0)
MCHC: 34.8 g/dL (ref 32.0–36.0)
MCV: 101 fL — ABNORMAL HIGH (ref 80.0–100.0)
Platelets: 168 10*3/uL (ref 150–440)
RBC: 2.47 MIL/uL — ABNORMAL LOW (ref 3.80–5.20)
RDW: 14.1 % (ref 11.5–14.5)
WBC: 7.1 10*3/uL (ref 3.6–11.0)

## 2017-12-25 MED ORDER — BENZONATATE 200 MG PO CAPS: 200.0000 mg | ORAL_CAPSULE | Freq: Three times a day (TID) | ORAL | 0 refills | Status: DC | PRN

## 2017-12-25 MED ORDER — CEFDINIR 300 MG PO CAPS
300.0000 mg | ORAL_CAPSULE | Freq: Two times a day (BID) | ORAL | Status: DC
  Administered 2017-12-25: 300 mg via ORAL
  Filled 2017-12-25 (×2): qty 1

## 2017-12-25 MED ORDER — CEFDINIR 300 MG PO CAPS: 300.0000 mg | ORAL_CAPSULE | Freq: Two times a day (BID) | ORAL | 0 refills | Status: AC

## 2017-12-25 NOTE — Discharge Instructions (Signed)
It was so nice to meet you during this hospitalization!  You were treated for a pneumonia during this hospital stay. We gave you antibiotics through your IV and then switched you to antibiotics by mouth.  You will need to continue to take the antibiotic (Omnicef) twice a day for 4 more days.  I have also sent in a prescription for a cough medication called Tessalon that you can take three times a day as needed.  -Dr. Nancy Marus

## 2017-12-25 NOTE — Progress Notes (Signed)
Pt discharged home with husband. IV's removed, discharge papers explained to patient and spouse. Verbalized understanding.

## 2017-12-25 NOTE — Discharge Summary (Addendum)
[facility] - [place] at [facility]   PATIENT NAME: Jenna Dudley    MR#:  [id number]  DATE OF BIRTH:  08-Aug-1935  DATE OF ADMISSION:  12/18/2017   ADMITTING PHYSICIAN: Ramonita Lab, MD  DATE OF DISCHARGE: 12/25/2017  2:50 PM  PRIMARY CARE PHYSICIAN: Aderoju, Marin Olp, MD   ADMISSION DIAGNOSIS:  Sepsis, due to unspecified organism [A41.9] Community acquired pneumonia of right lower lobe of lung (HCC) [J18.1] DISCHARGE DIAGNOSIS:  Principal Problem:   Severe sepsis (HCC) Active Problems:   Essential hypertension   HLD (hyperlipidemia)   Acid reflux   Bipolar 1 disorder (HCC)   HCAP (healthcare-associated pneumonia)  SECONDARY DIAGNOSIS:   Past Medical History:  Diagnosis Date  . Bilateral carpal tunnel syndrome 07/27/14  . Bipolar disorder [facility])    geropsych admission to [facility] in Dec 2018  . Chronic diastolic CHF (congestive heart failure) (HCC)    a. 03/2017 Echo: EF 55-60%, no rwma, Gr1 DD, mild MR.  . Chronic low back pain 07/29/14  . Degenerative arthritis of lumbar spine 07/27/14  . Dementia   . Depression   . Esophageal spasm 07/29/14  . GERD (gastroesophageal reflux disease)   . Hyperlipemia 07/29/14  . Hypertension   . Spinal stenosis 07/29/14   HOSPITAL COURSE:   Bill is an 82 year old female who presented to the ED with shortness of breath. In the ED, she was meeting sepsis criteria. CXR showed multifocal pneumonia. She was admitted for further management.  CAP- CT chest showed multifocal pneumonia- improved. - initially on BiPAP but weaned to [facility] and was stable on room air at the time of discharge - initially treated with ceftriaxone and azithromycin, then changed to omnicef/azithro for total 10 day course - initial blood culture grew coag negative staph in 1 bottle, likely contaminant - repeat blood cultures with no growth. - recheck PCT - needs f/u CXR in 3-4 weeks  Essential hypertension- BPs overall well-controlled - continued  metoprolol and losartan  Bipolar 1 disorder- stable - continued home medications  Hyperlipidemia- stable - continued home simvastatin  GERD- stable - continued PPI  Macrocytic anemia- Hgb has trended down but has stabilized. No signs of active bleeding - vitamin B12 and folate were normal - f/u as an outpatient  Generalized weakness- stable - PT recommended HHPT and rolling walker  DISCHARGE CONDITIONS:  CAP, improved HTN Bipolar 1 HLD GERD Macrocytic anemia CONSULTS OBTAINED:  CCM DRUG ALLERGIES:   Allergies  Allergen Reactions  . Aspirin Other (See Comments)    Unknown reaction  . Sulfa Antibiotics Other (See Comments)    Unknown reaction   DISCHARGE MEDICATIONS:   Allergies as of 12/25/2017      Reactions   Aspirin Other (See Comments)   Unknown reaction   Sulfa Antibiotics Other (See Comments)   Unknown reaction      Medication List    TAKE these medications   acetaminophen-codeine 300-30 MG tablet Commonly known as:  TYLENOL #3 Take 1 tablet by mouth every 8 (eight) hours as needed for moderate pain or severe pain.   benzonatate 200 MG capsule Commonly known as:  TESSALON Take 1 capsule (200 mg total) by mouth 3 (three) times daily as needed for cough.   cefdinir 300 MG capsule Commonly known as:  OMNICEF Take 1 capsule (300 mg total) by mouth every 12 (twelve) hours for 4 days.   cholecalciferol 1000 units tablet Commonly known as:  VITAMIN D Take 1,000 Units by mouth daily.  ferrous sulfate 325 (65 FE) MG EC tablet Take 325 mg by mouth daily with breakfast.   furosemide 20 MG tablet Commonly known as:  LASIX TAKE 1 TABLET BY MOUTH ONCE DAILY What changed:  when to take this   losartan 25 MG tablet Commonly known as:  COZAAR Take 1 tablet (25 mg total) by mouth daily.   magnesium oxide 400 MG tablet Commonly known as:  MAG-OX Take 400 mg by mouth daily.   metoprolol tartrate 50 MG tablet Commonly known as:  LOPRESSOR Take  50 mg by mouth 2 (two) times daily.   mirtazapine 45 MG tablet Commonly known as:  REMERON Take 1 tablet (45 mg total) by mouth at bedtime.   OLANZapine 20 MG tablet Commonly known as:  ZYPREXA Take 1 tablet (20 mg total) by mouth at bedtime.   omeprazole 20 MG capsule Commonly known as:  PRILOSEC Take 20 mg by mouth 2 (two) times daily before a meal.   simvastatin 40 MG tablet Commonly known as:  ZOCOR Take 40 mg by mouth at bedtime.   venlafaxine XR 150 MG 24 hr capsule Commonly known as:  EFFEXOR-XR TAKE 1 CAPSULE BY MOUTH DAILY WITH BREAKFAST            Durable Medical Equipment  (From admission, onward)         Start     Ordered   12/25/17 1006  For home use only DME Dan Humphreys  [facility])  Once    Question:  Patient needs a walker to treat with the following condition  Answer:  Generalized weakness   12/25/17 1005          DISCHARGE INSTRUCTIONS:  1. F/u with PCP in 1-2 weeks 2. Needs f/u CXR in 3-4 weeks to ensure resolution 3. Discharged home on a total 10 day course of abx DIET:  Cardiac diet DISCHARGE CONDITION:  Stable ACTIVITY:  Activity as tolerated OXYGEN:  Home Oxygen: No.  Oxygen Delivery: room air DISCHARGE LOCATION:  home   If you experience worsening of your admission symptoms, develop shortness of breath, life threatening emergency, suicidal or homicidal thoughts you must seek medical attention immediately by calling 911 or calling your MD immediately  if symptoms less severe.  You Must read complete instructions/literature along with all the possible adverse reactions/side effects for all the Medicines you take and that have been prescribed to you. Take any new Medicines after you have completely understood and accpet all the possible adverse reactions/side effects.   Please note  You were cared for by a hospitalist during your hospital stay. If you have any questions about your discharge medications or the care you received while you  were in the hospital after you are discharged, you can call the unit and asked to speak with the hospitalist on call if the hospitalist that took care of you is not available. Once you are discharged, your primary care physician will handle any further medical issues. Please note that NO REFILLS for any discharge medications will be authorized once you are discharged, as it is imperative that you return to your primary care physician (or establish a relationship with a primary care physician if you do not have one) for your aftercare needs so that they can reassess your need for medications and monitor your lab values.    On the day of Discharge:  VITAL SIGNS:  Blood pressure (!) 120/50, pulse 87, temperature 98 F (36.7 C), temperature source Oral, resp. rate 18, height 5' (  1.524 m), weight 63.5 kg, SpO2 93 %. PHYSICAL EXAMINATION:  GENERAL:  82 y.o.-year-old patient lying in the bed with no acute distress.  EYES: Pupils equal, round, reactive to light and accommodation. No scleral icterus. Extraocular muscles intact.  HEENT: Head atraumatic, normocephalic. Oropharynx and nasopharynx clear.  NECK:  Supple, no jugular venous distention. No thyroid enlargement, no tenderness.  LUNGS: +bibasilar crackles, no wheezing. No use of accessory muscles of respiration.  CARDIOVASCULAR: RRR, S1, S2 normal. No murmurs, rubs, or gallops.  ABDOMEN: Soft, non-tender, non-distended. Bowel sounds present. No organomegaly or mass.  EXTREMITIES: No pedal edema, cyanosis, or clubbing.  NEUROLOGIC: Cranial nerves II through XII are intact. Muscle strength 5/5 in all extremities. Sensation intact. Gait not checked.  PSYCHIATRIC: The patient is alert and oriented x 3.  SKIN: No obvious rash, lesion, or ulcer.  DATA REVIEW:   CBC Recent Labs  Lab 12/25/17 0447  WBC 7.1  HGB 8.7*  HCT 24.9*  PLT 168    Chemistries  Recent Labs  Lab 12/20/17 0612 12/21/17 0520 12/25/17 0447  NA 136 136 138  K 4.4 4.1  3.9  CL 102 102 103  CO2 23 24 26   GLUCOSE 112* 108* 106*  BUN 17 14 15   CREATININE 0.93 0.76 0.70  CALCIUM 8.4* 8.1* 8.3*  MG 1.6* 2.3  --   AST 155*  --   --   ALT 174*  --   --   ALKPHOS 58  --   --   BILITOT 0.7  --   --      Microbiology Results  Results for orders placed or performed during the hospital encounter of 12/18/17  Urine culture     Status: None   Collection Time: 12/18/17 10:30 PM  Result Value Ref Range Status   Specimen Description   Final    URINE, RANDOM Performed at [facility], [address] [place], [place] [address]    Special Requests   Final    NONE Performed at [facility], [address] [place], [place] [address]    Culture   Final    NO GROWTH Performed at [facility], [address], [place], [place] [address]    Report Status 12/20/2017 FINAL  Final  Blood Culture (routine x 2)     Status: None   Collection Time: 12/18/17 10:38 PM  Result Value Ref Range Status   Specimen Description BLOOD BLOOD RIGHT HAND  Final   Special Requests   Final    BOTTLES DRAWN AEROBIC AND ANAEROBIC Blood Culture adequate volume   Culture   Final    NO GROWTH 5 DAYS Performed at [facility], [address], [place], [place] [address]    Report Status 12/23/2017 FINAL  Final  Blood Culture (routine x 2)     Status: Abnormal   Collection Time: 12/18/17 10:38 PM  Result Value Ref Range Status   Specimen Description   Final    BLOOD RIGHT ANTECUBITAL Performed at [facility], [address] [place], [place] [address]    Special Requests   Final    BOTTLES DRAWN AEROBIC AND ANAEROBIC Blood Culture results may not be optimal due to an excessive volume of blood received in culture bottles Performed at [facility], [address] [place], [place] [address]    Culture  Setup Time   Final    ANAEROBIC BOTTLE ONLY GRAM POSITIVE COCCI CRITICAL RESULT CALLED TO, READ BACK BY  AND VERIFIED WITH:  JASON ROBBINS ON 12/19/17 AT 2133 JAG AEROBIC BOTTLE ONLY GRAM VARIABLE ROD IDENTIFICATION TO FOLLOW CRITICAL RESULT CALLED TO, READ BACK BY AND VERIFIED WITH: C/DAVID BESANTI @0049  12/21/17 [facility]    Culture (A)  Final    STAPHYLOCOCCUS SPECIES (COAGULASE NEGATIVE) GRAM VARIABLE ROD NONVIABLE FOR WORKUP THE SIGNIFICANCE OF ISOLATING THIS ORGANISM FROM A SINGLE SET OF BLOOD CULTURES WHEN MULTIPLE SETS ARE DRAWN IS UNCERTAIN. PLEASE NOTIFY THE MICROBIOLOGY DEPARTMENT WITHIN ONE WEEK IF SPECIATION AND SENSITIVITIES ARE REQUIRED. Performed at [facility], [address], [place], [place] [address]    Report Status 12/22/2017 FINAL  Final  Blood Culture ID Panel (Reflexed)     Status: Abnormal   Collection Time: 12/18/17 10:38 PM  Result Value Ref Range Status   Enterococcus species NOT DETECTED NOT DETECTED Final   Listeria monocytogenes NOT DETECTED NOT DETECTED Final   Staphylococcus species DETECTED (A) NOT DETECTED Final    Comment: Methicillin (oxacillin) resistant coagulase negative staphylococcus. Possible blood culture contaminant (unless isolated from more than one blood culture draw or clinical case suggests pathogenicity). No antibiotic treatment is indicated for blood  culture contaminants. CRITICAL RESULT CALLED TO, READ BACK BY AND VERIFIED WITH: JASON ROBBINS ON 12/19/17 AT 2133 JAG    Staphylococcus aureus NOT DETECTED NOT DETECTED Final   Methicillin resistance DETECTED (A) NOT DETECTED Final    Comment: CRITICAL RESULT CALLED TO, READ BACK BY AND VERIFIED WITH: JASON ROBBINS ON 12/19/17 AT 2133 JAG    Streptococcus species NOT DETECTED NOT DETECTED Final   Streptococcus agalactiae NOT DETECTED NOT DETECTED Final   Streptococcus pneumoniae NOT DETECTED NOT DETECTED Final   Streptococcus pyogenes NOT DETECTED NOT DETECTED Final   Acinetobacter baumannii NOT DETECTED NOT DETECTED Final   Enterobacteriaceae species NOT DETECTED NOT DETECTED Final   Enterobacter  cloacae complex NOT DETECTED NOT DETECTED Final   Escherichia coli NOT DETECTED NOT DETECTED Final   Klebsiella oxytoca NOT DETECTED NOT DETECTED Final   Klebsiella pneumoniae NOT DETECTED NOT DETECTED Final   Proteus species NOT DETECTED NOT DETECTED Final   Serratia marcescens NOT DETECTED NOT DETECTED Final   Haemophilus influenzae NOT DETECTED NOT DETECTED Final   Neisseria meningitidis NOT DETECTED NOT DETECTED Final   Pseudomonas aeruginosa NOT DETECTED NOT DETECTED Final   Candida albicans NOT DETECTED NOT DETECTED Final   Candida glabrata NOT DETECTED NOT DETECTED Final   Candida krusei NOT DETECTED NOT DETECTED Final   Candida parapsilosis NOT DETECTED NOT DETECTED Final   Candida tropicalis NOT DETECTED NOT DETECTED Final    Comment: Performed at [facility], [address] [place], [place] [address]  Blood Culture ID Panel (Reflexed)     Status: None   Collection Time: 12/18/17 10:40 PM  Result Value Ref Range Status   Enterococcus species NOT DETECTED NOT DETECTED Final   Listeria monocytogenes NOT DETECTED NOT DETECTED Final   Staphylococcus species NOT DETECTED NOT DETECTED Final   Staphylococcus aureus NOT DETECTED NOT DETECTED Final   Streptococcus species NOT DETECTED NOT DETECTED Final   Streptococcus agalactiae NOT DETECTED NOT DETECTED Final   Streptococcus pneumoniae NOT DETECTED NOT DETECTED Final   Streptococcus pyogenes NOT DETECTED NOT DETECTED Final   Acinetobacter baumannii NOT DETECTED NOT DETECTED Final   Enterobacteriaceae species NOT DETECTED NOT DETECTED Final   Enterobacter cloacae complex NOT DETECTED NOT DETECTED Final   Escherichia coli NOT DETECTED NOT DETECTED Final   Klebsiella oxytoca NOT DETECTED NOT DETECTED Final  Klebsiella pneumoniae NOT DETECTED NOT DETECTED Final   Proteus species NOT DETECTED NOT DETECTED Final   Serratia marcescens NOT DETECTED NOT DETECTED Final   Haemophilus influenzae NOT DETECTED NOT DETECTED Final    Neisseria meningitidis NOT DETECTED NOT DETECTED Final   Pseudomonas aeruginosa NOT DETECTED NOT DETECTED Final   Candida albicans NOT DETECTED NOT DETECTED Final   Candida glabrata NOT DETECTED NOT DETECTED Final   Candida krusei NOT DETECTED NOT DETECTED Final   Candida parapsilosis NOT DETECTED NOT DETECTED Final   Candida tropicalis NOT DETECTED NOT DETECTED Final    Comment: Performed at [facility], [address] [place], [place] [address]  MRSA PCR Screening     Status: None   Collection Time: 12/19/17  2:58 AM  Result Value Ref Range Status   MRSA by PCR NEGATIVE NEGATIVE Final    Comment:        The GeneXpert MRSA Assay (FDA approved for NASAL specimens only), is one component of a comprehensive MRSA colonization surveillance program. It is not intended to diagnose MRSA infection nor to guide or monitor treatment for MRSA infections. Performed at [facility], [address] [place], [place] [address]   CULTURE, BLOOD (ROUTINE X 2) w Reflex to ID Panel     Status: None   Collection Time: 12/20/17 12:44 PM  Result Value Ref Range Status   Specimen Description BLOOD BLOOD RIGHT HAND  Final   Special Requests   Final    BOTTLES DRAWN AEROBIC AND ANAEROBIC Blood Culture results may not be optimal due to an inadequate volume of blood received in culture bottles   Culture   Final    NO GROWTH 5 DAYS Performed at [facility], [address] [place], [place] [address]    Report Status 12/25/2017 FINAL  Final  CULTURE, BLOOD (ROUTINE X 2) w Reflex to ID Panel     Status: None   Collection Time: 12/20/17  4:50 PM  Result Value Ref Range Status   Specimen Description BLOOD BLOOD LEFT HAND  Final   Special Requests   Final    BOTTLES DRAWN AEROBIC AND ANAEROBIC Blood Culture results may not be optimal due to an inadequate volume of blood received in culture bottles   Culture   Final    NO GROWTH 5 DAYS Performed at [facility],  [address], [place], [place] [address]    Report Status 12/25/2017 FINAL  Final    RADIOLOGY:  No results found.   Management plans discussed with the patient, family and they are in agreement.  CODE STATUS: Full Code   TOTAL TIME TAKING CARE OF THIS PATIENT: 35 minutes.    Jinny Blossom Matelyn Antonelli M.D on 12/25/2017 at 6:01 PM  Between 7am to 6pm - Pager - [phone]  After 6pm go to [email or web address] - Social research officer, government  [facility] [place] [facility]  Office  [phone]  CC: Primary care physician; Aderoju, Marin Olp, MD   Note: This dictation was prepared with Dragon dictation along with smaller phrase technology. Any transcriptional errors that result from this process are unintentional.

## 2017-12-25 NOTE — Care Management Note (Signed)
Case Management Note  Patient Details  Name: Jenna Dudley MRN: [id number] Date of Birth: [birth year]  Subjective/Objective:  Met with patient and her spouse at bedside. Patient was some what confused so I spoke with the spouse. Discussed PT recommendations and home health. He is agreeable and would like to use [facility] since he used them in the past with her. Referral to [facility] for HHPT. Will need a walker. Spouse to pick up at [facility] store. Notified Corene Cornea with [facility] of walker need. Home health orders in.   Action/Plan:   Expected Discharge Date:  12/25/17               Expected Discharge Plan:  [place]  In-House Referral:     Discharge planning Services  CM Consult  Post Acute Care Choice:  Durable Medical Equipment, Home Health Choice offered to:  Spouse  DME Arranged:  [facility] rolling DME Agency:  [place] Arranged:    [facility] Agency:  [place]  Status of Service:  Completed, signed off  If discussed at [place] of Stay Meetings, dates discussed:    Additional Comments:  Jolly Mango, RN 12/25/2017, 11:24 AM

## 2017-12-25 NOTE — Plan of Care (Signed)

## 2018-01-03 ENCOUNTER — Ambulatory Visit: Payer: Medicare Other | Admitting: Cardiovascular Disease

## 2018-01-03 ENCOUNTER — Encounter: Payer: Self-pay | Admitting: Cardiovascular Disease

## 2018-01-03 ENCOUNTER — Ambulatory Visit (INDEPENDENT_AMBULATORY_CARE_PROVIDER_SITE_OTHER): Payer: Medicare Other | Admitting: Cardiovascular Disease

## 2018-01-03 VITALS — BP 148/70 | HR 88 | Ht 60.0 in | Wt 145.0 lb

## 2018-01-03 DIAGNOSIS — I5032 Chronic diastolic (congestive) heart failure: Secondary | ICD-10-CM | POA: Diagnosis not present

## 2018-01-03 DIAGNOSIS — E785 Hyperlipidemia, unspecified: Secondary | ICD-10-CM | POA: Diagnosis not present

## 2018-01-03 DIAGNOSIS — I10 Essential (primary) hypertension: Secondary | ICD-10-CM

## 2018-01-03 NOTE — Progress Notes (Signed)
Cardiology Office Note   Date:  01/03/2018   ID:  Jenna Dudley, DOB July 18, 1935, MRN [id number]  PCP:  Aderoju, Marin Olp, MD  Cardiologist:   Lorine Bears, MD   Chief Complaint  Patient presents with  . other    SOB. Medications verbally reviewed.       History of Present Illness: Jenna Dudley is a 82 y.o. female who presents for a follow-up visit regarding chronic diastolic heart failure. She has multiple chronic medical conditions that include bipolar disorder requiring regular ECT treatments, hypertension, hyperlipidemia, GERD, chronic low back pain and early dementia. Echocardiogram in January 2019 showed an EF of 55 to 60% with grade 1 diastolic dysfunction and mild mitral regurgitation. Lexiscan Myoview in February 2019 showed no clear evidence of ischemia but the study was suboptimal. She was hospitalized recently at [facility] with pneumonia.  She improved with antibiotics. She has been relatively stable from a cardiac standpoint.  She seems to be more anxious than her baseline.  She denies any chest pain.  She continues to have residual dyspnea especially after her pneumonia.  Her husband assist with her medications.   Past Medical History:  Diagnosis Date  . Bilateral carpal tunnel syndrome 07/27/14  . Bipolar disorder [facility])    geropsych admission to [facility] in Dec 2018  . Chronic diastolic CHF (congestive heart failure) (HCC)    a. 03/2017 Echo: EF 55-60%, no rwma, Gr1 DD, mild MR.  . Chronic low back pain 07/29/14  . Degenerative arthritis of lumbar spine 07/27/14  . Dementia (HCC)   . Depression   . Esophageal spasm 07/29/14  . GERD (gastroesophageal reflux disease)   . Hyperlipemia 07/29/14  . Hypertension   . Spinal stenosis 07/29/14    Past Surgical History:  Procedure Laterality Date  . HIP SURGERY Right   . INNER EAR SURGERY Right   . REPLACEMENT TOTAL KNEE BILATERAL Bilateral 07/27/14     Current Outpatient Medications  Medication Sig Dispense  Refill  . acetaminophen-codeine (TYLENOL #3) 300-30 MG per tablet Take 1 tablet by mouth every 8 (eight) hours as needed for moderate pain or severe pain.    . benzonatate (TESSALON) 200 MG capsule Take 1 capsule (200 mg total) by mouth 3 (three) times daily as needed for cough. 20 capsule 0  . cholecalciferol (VITAMIN D) 1000 units tablet Take 1,000 Units by mouth daily.    . ferrous sulfate 325 (65 FE) MG EC tablet Take 325 mg by mouth daily with breakfast.    . furosemide (LASIX) 20 MG tablet TAKE 1 TABLET BY MOUTH ONCE DAILY (Patient taking differently: Take 20 mg by mouth at bedtime. ) 30 tablet 4  . losartan (COZAAR) 25 MG tablet Take 1 tablet (25 mg total) by mouth daily. 30 tablet 0  . magnesium oxide (MAG-OX) 400 MG tablet Take 400 mg by mouth daily.    . metoprolol tartrate (LOPRESSOR) 50 MG tablet Take 50 mg by mouth 2 (two) times daily.    . mirtazapine (REMERON) 45 MG tablet Take 1 tablet (45 mg total) by mouth at bedtime. 30 tablet 6  . OLANZapine (ZYPREXA) 20 MG tablet Take 1 tablet (20 mg total) by mouth at bedtime. 30 tablet 1  . omeprazole (PRILOSEC) 20 MG capsule Take 20 mg by mouth 2 (two) times daily before a meal.     . simvastatin (ZOCOR) 40 MG tablet Take 40 mg by mouth at bedtime.     Marland Kitchen venlafaxine XR (EFFEXOR-XR)  150 MG 24 hr capsule TAKE 1 CAPSULE BY MOUTH DAILY WITH BREAKFAST 30 capsule 6   No current facility-administered medications for this visit.    Facility-Administered Medications Ordered in Other Visits  Medication Dose Route Frequency Provider Last Rate Last Dose  . 0.9 %  sodium chloride infusion    Continuous PRN Lily Kocher, CRNA      . lactated ringers infusion   Intravenous Continuous Clapacs, John T, MD        Allergies:   Aspirin and Sulfa antibiotics    Social History:  The patient  reports that she quit smoking about 42 years ago. Her smoking use included cigarettes. She has never used smokeless tobacco. She reports that she does not drink  alcohol or use drugs.   Family History:  The patient's family history includes Hypertension in her mother; Stroke in her mother.    ROS:  Please see the history of present illness.   Otherwise, review of systems are positive for none.   All other systems are reviewed and negative.    PHYSICAL EXAM: VS:  BP (!) 148/70 (BP Location: Left Arm, Patient Position: Sitting, Cuff Size: Normal)   Pulse 88   Ht 5' (1.524 m)   Wt 145 lb (65.8 kg)   LMP  (LMP Unknown)   SpO2 98%   BMI 28.32 kg/m  , BMI Body mass index is 28.32 kg/m. GEN: Well nourished, well developed, in no acute distress  HEENT: normal  Neck: no JVD, carotid bruits, or masses Cardiac: RRR; no murmurs, rubs, or gallops,no edema  Respiratory:  clear to auscultation bilaterally, normal work of breathing GI: soft, nontender, nondistended, + BS MS: no deformity or atrophy  Skin: warm and dry, no rash Neuro:  Strength and sensation are intact Psych: euthymic mood, full affect   EKG:  EKG is ordered today. The ekg ordered today demonstrates sinus rhythm with possible old septal infarct.   Recent Labs: 04/20/2017: B Natriuretic Peptide 555.0 04/21/2017: TSH 3.384 12/20/2017: ALT 174 12/21/2017: Magnesium 2.3 12/25/2017: BUN 15; Creatinine, Ser 0.70; Hemoglobin 8.7; Platelets 168; Potassium 3.9; Sodium 138    Lipid Panel    Component Value Date/Time   CHOL 149 09/18/2014 0655   CHOL 144 03/30/2012 0512   TRIG 94 09/18/2014 0655   TRIG 103 03/30/2012 0512   HDL 33 (L) 09/18/2014 0655   HDL 33 (L) 03/30/2012 0512   CHOLHDL 4.5 09/18/2014 0655   VLDL 19 09/18/2014 0655   VLDL 21 03/30/2012 0512   LDLCALC 97 09/18/2014 0655   LDLCALC 90 03/30/2012 0512      Wt Readings from Last 3 Encounters:  01/03/18 145 lb (65.8 kg)  12/18/17 140 lb (63.5 kg)  08/02/17 133 lb 4 oz (60.4 kg)       No flowsheet data found.    ASSESSMENT AND PLAN:  1.  Chronic diastolic heart failure: She appears to be euvolemic on small  dose furosemide.  Continue medical therapy.  2.  Essential hypertension: Blood pressure is mildly elevated but she appears to be very anxious.  If blood pressure continues to be elevated, losartan can be increased.  3.  Hyperlipidemia: Currently on simvastatin 40 mg daily.  4.  Anxiety and depression: She definitely seems to be more anxious than last time I saw her.  She is going to follow-up with psychiatry in the near future.  She usually gets ECT treatments on a regular basis.  No contraindication from a cardiac standpoint.   Disposition:  FU with APP in 4 months.   Signed,  Lorine Bears, MD  01/03/2018 3:37 PM    [facility]

## 2018-01-03 NOTE — Patient Instructions (Signed)
Medication Instructions: Continue same medications.   Labwork: None.   Procedures/Testing: None.   Follow-Up: 4 months with APP  Any Additional Special Instructions Will Be Listed Below (If Applicable).     If you need a refill on your cardiac medications before your next appointment, please call your pharmacy.

## 2018-01-09 ENCOUNTER — Encounter
Admission: RE | Admit: 2018-01-09 | Discharge: 2018-01-09 | Disposition: A | Discharge status: Home / Self Care | Payer: Medicare Other | Source: Ambulatory Visit | Attending: Psychiatry | Admitting: Psychiatry

## 2018-01-09 ENCOUNTER — Other Ambulatory Visit: Payer: Self-pay | Admitting: Psychiatry

## 2018-01-09 DIAGNOSIS — F333 Major depressive disorder, recurrent, severe with psychotic symptoms: Secondary | ICD-10-CM

## 2018-01-09 DIAGNOSIS — F319 Bipolar disorder, unspecified: Secondary | ICD-10-CM | POA: Diagnosis not present

## 2018-01-09 DIAGNOSIS — F329 Major depressive disorder, single episode, unspecified: Secondary | ICD-10-CM | POA: Diagnosis present

## 2018-01-09 DIAGNOSIS — Z87891 Personal history of nicotine dependence: Secondary | ICD-10-CM | POA: Diagnosis not present

## 2018-01-09 DIAGNOSIS — K219 Gastro-esophageal reflux disease without esophagitis: Secondary | ICD-10-CM | POA: Diagnosis not present

## 2018-01-09 DIAGNOSIS — I5032 Chronic diastolic (congestive) heart failure: Secondary | ICD-10-CM | POA: Diagnosis not present

## 2018-01-09 DIAGNOSIS — I110 Hypertensive heart disease with heart failure: Secondary | ICD-10-CM | POA: Insufficient documentation

## 2018-01-09 DIAGNOSIS — Z882 Allergy status to sulfonamides status: Secondary | ICD-10-CM | POA: Diagnosis not present

## 2018-01-09 DIAGNOSIS — F0390 Unspecified dementia without behavioral disturbance: Secondary | ICD-10-CM | POA: Diagnosis not present

## 2018-01-09 HISTORY — DX: Pneumonia, unspecified organism: J18.9

## 2018-01-09 MED ORDER — LABETALOL HCL 5 MG/ML IV SOLN
INTRAVENOUS | Status: AC
  Filled 2018-01-09: qty 4

## 2018-01-09 MED ORDER — KETOROLAC TROMETHAMINE 30 MG/ML IJ SOLN
INTRAMUSCULAR | Status: AC
  Filled 2018-01-09: qty 1

## 2018-01-09 MED ORDER — METHOHEXITAL SODIUM 100 MG/10ML IV SOSY
PREFILLED_SYRINGE | INTRAVENOUS | Status: DC | PRN
  Administered 2018-01-09: 60 mg via INTRAVENOUS

## 2018-01-09 MED ORDER — KETOROLAC TROMETHAMINE 30 MG/ML IJ SOLN: 30.0000 mg | Freq: Once | INTRAMUSCULAR | Status: DC

## 2018-01-09 MED ORDER — SODIUM CHLORIDE 0.9 % IV SOLN
500.0000 mL | Freq: Once | INTRAVENOUS | Status: AC
  Administered 2018-01-09: 500 mL via INTRAVENOUS

## 2018-01-09 MED ORDER — SUCCINYLCHOLINE CHLORIDE 20 MG/ML IJ SOLN
INTRAMUSCULAR | Status: DC | PRN
  Administered 2018-01-09: 80 mg via INTRAVENOUS

## 2018-01-09 MED ORDER — LABETALOL HCL 5 MG/ML IV SOLN
INTRAVENOUS | Status: DC | PRN
  Administered 2018-01-09: 20 mg via INTRAVENOUS

## 2018-01-09 NOTE — Transfer of Care (Signed)
Immediate Anesthesia Transfer of Care Note  Patient: Jenna Dudley  Procedure(s) Performed: ECT TX  Patient Location: PACU  Anesthesia Type:General  Level of Consciousness: drowsy  Airway & Oxygen Therapy: Patient Spontanous Breathing and Patient connected to face mask oxygen  Post-op Assessment: Report given to RN and Post -op Vital signs reviewed and stable  Post vital signs: Reviewed and stable  Last Vitals:  Vitals Value Taken Time  BP 140/60 01/09/2018 10:58 AM  Temp 36.6 C 01/09/2018 10:58 AM  Pulse 95 01/09/2018 10:58 AM  Resp 24 01/09/2018 10:58 AM  SpO2 100 % 01/09/2018 10:58 AM    Last Pain:  Vitals:   01/09/18 1058  TempSrc: Oral  PainSc:          Complications: No apparent anesthesia complications

## 2018-01-09 NOTE — Anesthesia Post-op Follow-up Note (Signed)
Anesthesia QCDR form completed.        

## 2018-01-09 NOTE — Anesthesia Preprocedure Evaluation (Signed)
Anesthesia Evaluation  Patient identified by MRN, date of birth, ID band Patient awake    Reviewed: Allergy & Precautions, NPO status , Patient's Chart, lab work & pertinent test results  History of Anesthesia Complications Negative for: history of anesthetic complications  Airway Mallampati: II  TM Distance: >3 FB Neck ROM: Full    Dental  (+) Edentulous Upper, Poor Dentition   Pulmonary neg sleep apnea, neg COPD, former smoker,    breath sounds clear to auscultation- rhonchi (-) wheezing      Cardiovascular hypertension, Pt. on medications +CHF  (-) CAD, (-) Past MI, (-) Cardiac Stents and (-) CABG  Rhythm:Regular Rate:Normal - Systolic murmurs and - Diastolic murmurs Echo 04/22/17: - Left ventricle: The cavity size was normal. There was mild   concentric hypertrophy. Systolic function was normal. The   estimated ejection fraction was in the range of 55% to 60%. Wall   motion was normal; there were no regional wall motion   abnormalities. Doppler parameters are consistent with abnormal   left ventricular relaxation (grade 1 diastolic dysfunction). - Mitral valve: Calcified annulus. There was mild regurgitation.   Neuro/Psych PSYCHIATRIC DISORDERS Depression Bipolar Disorder Dementia negative neurological ROS     GI/Hepatic Neg liver ROS, GERD  ,  Endo/Other  negative endocrine ROSneg diabetes  Renal/GU negative Renal ROS     Musculoskeletal  (+) Arthritis ,   Abdominal (+) - obese,   Peds  Hematology negative hematology ROS (+)   Anesthesia Other Findings Past Medical History: 07/27/14: Bilateral carpal tunnel syndrome No date: Bipolar disorder (HCC)     Comment:  geropsych admission to [place] in Dec 2018 No date: Chronic diastolic CHF (congestive heart failure) (HCC)     Comment:  a. 03/2017 Echo: EF 55-60%, no rwma, Gr1 DD, mild MR. 07/29/14: Chronic low back pain 07/27/14: Degenerative arthritis of lumbar  spine No date: Dementia (HCC) No date: Depression 07/29/14: Esophageal spasm No date: GERD (gastroesophageal reflux disease) 07/29/14: Hyperlipemia No date: Hypertension No date: Pneumonia 07/29/14: Spinal stenosis   Reproductive/Obstetrics                             Anesthesia Physical  Anesthesia Plan  ASA: III  Anesthesia Plan: General   Post-op Pain Management:    Induction: Intravenous  PONV Risk Score and Plan: 2 and Ondansetron  Airway Management Planned: Mask  Additional Equipment:   Intra-op Plan:   Post-operative Plan:   Informed Consent: I have reviewed the patients History and Physical, chart, labs and discussed the procedure including the risks, benefits and alternatives for the proposed anesthesia with the patient or authorized representative who has indicated his/her understanding and acceptance.     Dental advisory given  Plan Discussed with: CRNA and Anesthesiologist  Anesthesia Plan Comments:         Anesthesia Quick Evaluation  

## 2018-01-09 NOTE — Procedures (Signed)
ECT SERVICES Physician's Interval Evaluation & Treatment Note  Patient Identification: Jenna Dudley MRN:  [id number] Date of Evaluation:  01/09/2018 TX #: 15  MADRS:   MMSE:   P.E. Findings:  No change to physical exam.  She had a pneumonia but has recovered from it and is breathing fine.  Psychiatric Interval Note:  Mood is a little bit more anxious than optimal but no psychosis.  Subjective:  Patient is a 82 y.o. female seen for evaluation for Electroconvulsive Therapy. Describes herself as feeling scared but not psychotic or bizarre  Treatment Summary:   [x]   Right Unilateral             []  Bilateral   % Energy : 0.3 ms 90%   Impedance: 1650 ohms  Seizure Energy Index: 4241 V squared  Postictal Suppression Index: 72%  Seizure Concordance Index: 60%  Medications  Pre Shock: Labetalol 20 mg Brevital 60 mg succinylcholine 80 mg  Post Shock:    Seizure Duration: 15 seconds by EMG 42 seconds by EEG   Comments: I would like to see her back in 2 weeks which I think we will get her back on a more regular stable schedule.  Lungs:  [x]   Clear to auscultation               []  Other:   Heart:    [x]   Regular rhythm             []  irregular rhythm    [x]   Previous H&P reviewed, patient examined and there are NO CHANGES                 []   Previous H&P reviewed, patient examined and there are changes noted.   Mordecai Rasmussen, MD 10/16/201910:43 AM

## 2018-01-09 NOTE — Anesthesia Postprocedure Evaluation (Signed)
Anesthesia Post Note  Patient: Jenna Dudley  Procedure(s) Performed: ECT TX  Patient location during evaluation: PACU Anesthesia Type: General Level of consciousness: awake and alert Pain management: pain level controlled Vital Signs Assessment: post-procedure vital signs reviewed and stable Respiratory status: spontaneous breathing, nonlabored ventilation and respiratory function stable Cardiovascular status: blood pressure returned to baseline and stable Postop Assessment: no signs of nausea or vomiting Anesthetic complications: no     Last Vitals:  Vitals:   01/09/18 1146 01/09/18 1204  BP: (!) 114/36 (!) 112/50  Pulse: 87 91  Resp: 16 16  Temp:    SpO2: 94%     Last Pain:  Vitals:   01/09/18 1204  TempSrc:   PainSc: 0-No pain                 Caitlan Chauca

## 2018-01-09 NOTE — Discharge Instructions (Signed)
1)  The drugs that you have been given will stay in your system until tomorrow so for the       next 24 hours you should not:  A. Drive an automobile  B. Make any legal decisions  C. Drink any alcoholic beverages  2)  You may resume your regular meals upon return home.  3)  A responsible adult must take you home.  Someone should stay with you for a few          hours, then be available by phone for the remainder of the treatment day.  4)  You May experience any of the following symptoms:  Headache, Nausea and a dry mouth (due to the medications you were given),  temporary memory loss and some confusion, or sore muscles (a warm bath  should help this).  If you you experience any of these symptoms let us know on                your return visit.  5)  Report any of the following: any acute discomfort, severe headache, or temperature        greater than 100.5 F.   Also report any unusual redness, swelling, drainage, or pain         at your IV site.    You may report Symptoms to:  ECT PROGRAM- [place] at [facility]          Phone: [phone], ECT Department           or Dr. Shary Key office [phone]  6)  Your next ECT Treatment is Wednesday October 30   We will call 2 days prior to your scheduled appointment for arrival times.  7)  Nothing to eat or drink after midnight the night before your procedure.  8)  Take Cozaar     With a sip of water the morning of your procedure.  9)  Other Instructions: Call [phone] to cancel the morning of your procedure due         to illness or emergency.  10) We will call within 72 hours to assess how you are feeling.

## 2018-01-09 NOTE — H&P (Signed)
Jenna Dudley is an 82 y.o. female.   Chief Complaint: Follow-up for patient whose chief complaint is "I am a little scared" HPI: Patient with long-standing depression as part of bipolar disorder who has a tendency towards psychotic depression.  She is currently doing fairly well although perhaps not as well as when we saw her a month ago.  A little nervous but not psychotic.  Past Medical History:  Diagnosis Date  . Bilateral carpal tunnel syndrome 07/27/14  . Bipolar disorder [facility])    geropsych admission to [facility] in Dec 2018  . Chronic diastolic CHF (congestive heart failure) (HCC)    a. 03/2017 Echo: EF 55-60%, no rwma, Gr1 DD, mild MR.  . Chronic low back pain 07/29/14  . Degenerative arthritis of lumbar spine 07/27/14  . Dementia (HCC)   . Depression   . Esophageal spasm 07/29/14  . GERD (gastroesophageal reflux disease)   . Hyperlipemia 07/29/14  . Hypertension   . Pneumonia   . Spinal stenosis 07/29/14    Past Surgical History:  Procedure Laterality Date  . HIP SURGERY Right   . INNER EAR SURGERY Right   . REPLACEMENT TOTAL KNEE BILATERAL Bilateral 07/27/14    Family History  Problem Relation Age of Onset  . Hypertension Mother   . Stroke Mother    Social History:  reports that she quit smoking about 42 years ago. Her smoking use included cigarettes. She has never used smokeless tobacco. She reports that she does not drink alcohol or use drugs.  Allergies:  Allergies  Allergen Reactions  . Aspirin Other (See Comments)    Unknown reaction  . Sulfa Antibiotics Other (See Comments)    Unknown reaction     (Not in a hospital admission)  No results found for this or any previous visit (from the past 48 hour(s)). No results found.  Review of Systems  Constitutional: Negative.   HENT: Negative.   Eyes: Negative.   Respiratory: Negative.   Cardiovascular: Negative.   Gastrointestinal: Negative.   Musculoskeletal: Negative.   Skin: Negative.   Neurological: Negative.    Psychiatric/Behavioral: Positive for memory loss. Negative for depression, hallucinations, substance abuse and suicidal ideas. The patient is nervous/anxious. The patient does not have insomnia.     Blood pressure (!) 155/51, pulse (!) 106, temperature 99.1 F (37.3 C), temperature source Oral, resp. rate 18, height 5' (1.524 m), weight 66 kg, SpO2 97 %. Physical Exam  Nursing note and vitals reviewed. Constitutional: She appears well-developed and well-nourished.  HENT:  Head: Normocephalic and atraumatic.  Eyes: Pupils are equal, round, and reactive to light. Conjunctivae are normal.  Neck: Normal range of motion.  Cardiovascular: Regular rhythm and normal heart sounds.  Respiratory: Effort normal. No respiratory distress.  GI: Soft.  Musculoskeletal: Normal range of motion.  Neurological: She is alert.  Skin: Skin is warm and dry.  Psychiatric: Judgment normal. Her mood appears anxious. Her speech is delayed. She is slowed. Thought content is not paranoid. She expresses no homicidal and no suicidal ideation. She exhibits abnormal recent memory.     Assessment/Plan Treatment today and if possible I would prefer to have her back on the every 2-week schedule.  Mordecai Rasmussen, MD 01/09/2018, 10:41 AM

## 2018-01-09 NOTE — Anesthesia Procedure Notes (Signed)
Performed by: Maiah Sinning, CRNA Pre-anesthesia Checklist: Patient identified, Emergency Drugs available, Suction available and Patient being monitored Patient Re-evaluated:Patient Re-evaluated prior to induction Oxygen Delivery Method: Circle system utilized Preoxygenation: Pre-oxygenation with 100% oxygen Induction Type: IV induction Ventilation: Mask ventilation without difficulty and Mask ventilation throughout procedure Airway Equipment and Method: Bite block Placement Confirmation: positive ETCO2 Dental Injury: Teeth and Oropharynx as per pre-operative assessment        

## 2018-01-21 ENCOUNTER — Telehealth: Payer: Self-pay

## 2018-01-22 ENCOUNTER — Other Ambulatory Visit: Payer: Self-pay | Admitting: Psychiatry

## 2018-01-23 ENCOUNTER — Encounter: Payer: Self-pay | Admitting: Anesthesiology

## 2018-01-23 ENCOUNTER — Encounter (HOSPITAL_BASED_OUTPATIENT_CLINIC_OR_DEPARTMENT_OTHER)
Admission: RE | Admit: 2018-01-23 | Discharge: 2018-01-23 | Disposition: A | Discharge status: Home / Self Care | Payer: Medicare Other | Source: Ambulatory Visit | Attending: Psychiatry | Admitting: Psychiatry

## 2018-01-23 DIAGNOSIS — F332 Major depressive disorder, 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 features: Secondary | ICD-10-CM | POA: Diagnosis not present

## 2018-01-23 DIAGNOSIS — F319 Bipolar disorder, unspecified: Secondary | ICD-10-CM | POA: Diagnosis not present

## 2018-01-23 MED ORDER — SUCCINYLCHOLINE CHLORIDE 20 MG/ML IJ SOLN
INTRAMUSCULAR | Status: AC
  Filled 2018-01-23: qty 1

## 2018-01-23 MED ORDER — LABETALOL HCL 5 MG/ML IV SOLN
INTRAVENOUS | Status: DC | PRN
  Administered 2018-01-23: 20 mg via INTRAVENOUS

## 2018-01-23 MED ORDER — SODIUM CHLORIDE 0.9 % IV SOLN
500.0000 mL | Freq: Once | INTRAVENOUS | Status: AC
  Administered 2018-01-23: 500 mL via INTRAVENOUS

## 2018-01-23 MED ORDER — LABETALOL HCL 5 MG/ML IV SOLN
INTRAVENOUS | Status: AC
  Filled 2018-01-23: qty 4

## 2018-01-23 MED ORDER — METHOHEXITAL SODIUM 100 MG/10ML IV SOSY
PREFILLED_SYRINGE | INTRAVENOUS | Status: DC | PRN
  Administered 2018-01-23: 60 mg via INTRAVENOUS

## 2018-01-23 MED ORDER — SUCCINYLCHOLINE CHLORIDE 200 MG/10ML IV SOSY
PREFILLED_SYRINGE | INTRAVENOUS | Status: DC | PRN
  Administered 2018-01-23: 80 mg via INTRAVENOUS

## 2018-01-23 NOTE — Transfer of Care (Signed)
Immediate Anesthesia Transfer of Care Note  Patient: Jenna Dudley  Procedure(s) Performed: ECT TX  Patient Location: PACU  Anesthesia Type:General  Level of Consciousness: sedated  Airway & Oxygen Therapy: Patient Spontanous Breathing and Patient connected to face mask oxygen  Post-op Assessment: Report given to RN and Post -op Vital signs reviewed and stable  Post vital signs: Reviewed and stable  Last Vitals:  Vitals Value Taken Time  BP 123/76 01/23/2018 11:20 AM  Temp    Pulse 86 01/23/2018 11:21 AM  Resp 24 01/23/2018 11:21 AM  SpO2 100 % 01/23/2018 11:21 AM  Vitals shown include unvalidated device data.  Last Pain:  Vitals:   01/23/18 0848  TempSrc:   PainSc: 0-No pain         Complications: No apparent anesthesia complications

## 2018-01-23 NOTE — Anesthesia Procedure Notes (Signed)
Date/Time: 01/23/2018 11:09 AM Performed by: Lily Kocher, CRNA Pre-anesthesia Checklist: Patient identified, Emergency Drugs available, Suction available and Patient being monitored Patient Re-evaluated:Patient Re-evaluated prior to induction Oxygen Delivery Method: Circle system utilized Preoxygenation: Pre-oxygenation with 100% oxygen Induction Type: IV induction Ventilation: Mask ventilation without difficulty and Mask ventilation throughout procedure Airway Equipment and Method: Bite block Placement Confirmation: positive ETCO2 Dental Injury: Teeth and Oropharynx as per pre-operative assessment

## 2018-01-23 NOTE — Discharge Instructions (Signed)
1)  The drugs that you have been given will stay in your system until tomorrow so for the       next 24 hours you should not:  A. Drive an automobile  B. Make any legal decisions  C. Drink any alcoholic beverages  2)  You may resume your regular meals upon return home.  3)  A responsible adult must take you home.  Someone should stay with you for a few          hours, then be available by phone for the remainder of the treatment day.  4)  You May experience any of the following symptoms:  Headache, Nausea and a dry mouth (due to the medications you were given),  temporary memory loss and some confusion, or sore muscles (a warm bath  should help this).  If you you experience any of these symptoms let us know on                your return visit.  5)  Report any of the following: any acute discomfort, severe headache, or temperature        greater than 100.5 F.   Also report any unusual redness, swelling, drainage, or pain         at your IV site.    You may report Symptoms to:  ECT PROGRAM- [place] at [facility]          Phone: [phone], ECT Department           or Dr. Shary Key office [phone]  6)  Your next ECT Treatment is Wednesday November 13   We will call 2 days prior to your scheduled appointment for arrival times.  7)  Nothing to eat or drink after midnight the night before your procedure.  8)  Take     With a sip of water the morning of your procedure.  9)  Other Instructions: Call [phone] to cancel the morning of your procedure due         to illness or emergency.  10) We will call within 72 hours to assess how you are feeling.

## 2018-01-23 NOTE — Anesthesia Post-op Follow-up Note (Signed)
Anesthesia QCDR form completed.        

## 2018-01-23 NOTE — Procedures (Signed)
ECT SERVICES Physician's Interval Evaluation & Treatment Note  Patient Identification: RHONNA HOLSTER MRN:  [id number] Date of Evaluation:  01/23/2018 TX #: 16  MADRS:   MMSE:   P.E. Findings:  No change to physical exam.  Still some back pain.  Psychiatric Interval Note:  Psychiatrically more attentive alert and less anxious than before.  Appears to be back to baseline.  Subjective:  Patient is a 82 y.o. female seen for evaluation for Electroconvulsive Therapy. No complaints  Treatment Summary:   [x]   Right Unilateral             []  Bilateral   % Energy : 0.3 ms 90%   Impedance: 2430 ohms  Seizure Energy Index: 5237 V squared  Postictal Suppression Index: 14%  Seizure Concordance Index: 89%  Medications  Pre Shock: Toradol 30 mg labetalol 20 mg Brevital 60 mg succinylcholine 80 mg  Post Shock:    Seizure Duration: 27 seconds EMG 36 seconds EEG   Comments: Follow-up in 2 weeks  Lungs:  [x]   Clear to auscultation               []  Other:   Heart:    [x]   Regular rhythm             []  irregular rhythm    [x]   Previous H&P reviewed, patient examined and there are NO CHANGES                 []   Previous H&P reviewed, patient examined and there are changes noted.   Mordecai Rasmussen, MD 10/30/201910:58 AM

## 2018-01-23 NOTE — Anesthesia Preprocedure Evaluation (Signed)
Anesthesia Evaluation  Patient identified by MRN, date of birth, ID band Patient awake    Reviewed: Allergy & Precautions, NPO status , Patient's Chart, lab work & pertinent test results  History of Anesthesia Complications Negative for: history of anesthetic complications  Airway Mallampati: II  TM Distance: >3 FB Neck ROM: Full    Dental  (+) Edentulous Upper, Poor Dentition, Missing   Pulmonary neg sleep apnea, neg COPD, former smoker,    - rhonchi (-) wheezing      Cardiovascular hypertension, Pt. on medications (-) angina+CHF (grade 1 diastolic dysfunction)  (-) CAD, (-) Past MI, (-) Cardiac Stents and (-) CABG  - Systolic murmurs and - Diastolic murmurs Echo 04/22/17: - Left ventricle: The cavity size was normal. There was mild   concentric hypertrophy. Systolic function was normal. The   estimated ejection fraction was in the range of 55% to 60%. Wall   motion was normal; there were no regional wall motion   abnormalities. Doppler parameters are consistent with abnormal   left ventricular relaxation (grade 1 diastolic dysfunction). - Mitral valve: Calcified annulus. There was mild regurgitation.   Neuro/Psych PSYCHIATRIC DISORDERS Depression Bipolar Disorder Dementia negative neurological ROS     GI/Hepatic Neg liver ROS, GERD  ,  Endo/Other  negative endocrine ROSneg diabetes  Renal/GU negative Renal ROS     Musculoskeletal  (+) Arthritis ,   Abdominal (+) - obese,   Peds  Hematology negative hematology ROS (+)   Anesthesia Other Findings Past Medical History: 07/27/14: Bilateral carpal tunnel syndrome No date: Bipolar disorder [facility])     Comment:  geropsych admission to [facility] in Dec 2018 No date: Chronic diastolic CHF (congestive heart failure) (HCC)     Comment:  a. 03/2017 Echo: EF 55-60%, no rwma, Gr1 DD, mild MR. 07/29/14: Chronic low back pain 07/27/14: Degenerative arthritis of lumbar spine No  date: Dementia (HCC) No date: Depression 07/29/14: Esophageal spasm No date: GERD (gastroesophageal reflux disease) 07/29/14: Hyperlipemia No date: Hypertension No date: Pneumonia 07/29/14: Spinal stenosis   Reproductive/Obstetrics                             Anesthesia Physical  Anesthesia Plan  ASA: III  Anesthesia Plan: General   Post-op Pain Management:    Induction: Intravenous  PONV Risk Score and Plan: 2 and Ondansetron  Airway Management Planned: Mask  Additional Equipment:   Intra-op Plan:   Post-operative Plan:   Informed Consent: I have reviewed the patients History and Physical, chart, labs and discussed the procedure including the risks, benefits and alternatives for the proposed anesthesia with the patient or authorized representative who has indicated his/her understanding and acceptance.   Dental advisory given  Plan Discussed with: CRNA and Anesthesiologist  Anesthesia Plan Comments:         Anesthesia Quick Evaluation

## 2018-01-23 NOTE — H&P (Signed)
Jenna Dudley is an 82 y.o. female.   Chief Complaint: Mood is feeling much better.  Stable back to baseline no complaint. HPI: History of recurrent severe psychotic depression held at bay by maintenance ECT and medication  Past Medical History:  Diagnosis Date  . Bilateral carpal tunnel syndrome 07/27/14  . Bipolar disorder [facility])    geropsych admission to [facility] in Dec 2018  . Chronic diastolic CHF (congestive heart failure) (HCC)    a. 03/2017 Echo: EF 55-60%, no rwma, Gr1 DD, mild MR.  . Chronic low back pain 07/29/14  . Degenerative arthritis of lumbar spine 07/27/14  . Dementia (HCC)   . Depression   . Esophageal spasm 07/29/14  . GERD (gastroesophageal reflux disease)   . Hyperlipemia 07/29/14  . Hypertension   . Pneumonia   . Spinal stenosis 07/29/14    Past Surgical History:  Procedure Laterality Date  . HIP SURGERY Right   . INNER EAR SURGERY Right   . REPLACEMENT TOTAL KNEE BILATERAL Bilateral 07/27/14    Family History  Problem Relation Age of Onset  . Hypertension Mother   . Stroke Mother    Social History:  reports that she quit smoking about 42 years ago. Her smoking use included cigarettes. She has never used smokeless tobacco. She reports that she does not drink alcohol or use drugs.  Allergies:  Allergies  Allergen Reactions  . Aspirin Other (See Comments)    Unknown reaction  . Sulfa Antibiotics Other (See Comments)    Unknown reaction     (Not in a hospital admission)  No results found for this or any previous visit (from the past 48 hour(s)). No results found.  Review of Systems  Constitutional: Negative.   HENT: Negative.   Eyes: Negative.   Respiratory: Negative.   Cardiovascular: Negative.   Gastrointestinal: Negative.   Musculoskeletal: Negative.   Skin: Negative.   Neurological: Negative.   Psychiatric/Behavioral: Negative.     Blood pressure (!) 133/46, pulse 87, temperature 97.7 F (36.5 C), temperature source Oral, resp. rate 16,  height 5' (1.524 m), weight 66.2 kg, SpO2 97 %. Physical Exam  Nursing note and vitals reviewed. Constitutional: She appears well-developed and well-nourished.  HENT:  Head: Normocephalic and atraumatic.  Eyes: Pupils are equal, round, and reactive to light. Conjunctivae are normal.  Neck: Normal range of motion.  Cardiovascular: Regular rhythm and normal heart sounds.  Respiratory: Effort normal.  GI: Soft.  Musculoskeletal: Normal range of motion.  Neurological: She is alert.  Skin: Skin is warm and dry.  Psychiatric: She has a normal mood and affect. Her behavior is normal. Judgment and thought content normal.     Assessment/Plan Continue with current plan see her back in 2 weeks  Mordecai Rasmussen, MD 01/23/2018, 10:57 AM

## 2018-01-24 NOTE — Anesthesia Postprocedure Evaluation (Signed)
Anesthesia Post Note  Patient: Jenna Dudley  Procedure(s) Performed: ECT TX  Patient location during evaluation: PACU Anesthesia Type: General Level of consciousness: awake and alert Pain management: pain level controlled Vital Signs Assessment: post-procedure vital signs reviewed and stable Respiratory status: spontaneous breathing, nonlabored ventilation, respiratory function stable and patient connected to nasal cannula oxygen Cardiovascular status: blood pressure returned to baseline and stable Postop Assessment: no apparent nausea or vomiting Anesthetic complications: no     Last Vitals:  Vitals:   01/23/18 1150 01/23/18 1205  BP: 128/74 (!) 120/98  Pulse: 89 70  Resp: 17 16  Temp: 37.3 C 36.9 C  SpO2: 99%     Last Pain:  Vitals:   01/23/18 1205  TempSrc: Oral  PainSc: 0-No pain                 Jovita Gamma

## 2018-02-04 ENCOUNTER — Telehealth: Payer: Self-pay | Admitting: *Deleted

## 2018-02-05 ENCOUNTER — Other Ambulatory Visit: Payer: Self-pay | Admitting: Psychiatry

## 2018-02-06 ENCOUNTER — Encounter: Payer: Self-pay | Admitting: Anesthesiology

## 2018-02-06 ENCOUNTER — Encounter
Admission: RE | Admit: 2018-02-06 | Discharge: 2018-02-06 | Disposition: A | Discharge status: Home / Self Care | Payer: Medicare Other | Source: Ambulatory Visit | Attending: Psychiatry | Admitting: Psychiatry

## 2018-02-06 ENCOUNTER — Other Ambulatory Visit: Payer: Self-pay | Admitting: Psychiatry

## 2018-02-06 DIAGNOSIS — Z882 Allergy status to sulfonamides status: Secondary | ICD-10-CM | POA: Insufficient documentation

## 2018-02-06 DIAGNOSIS — F332 Major depressive disorder, 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 features: Secondary | ICD-10-CM | POA: Insufficient documentation

## 2018-02-06 DIAGNOSIS — Z8249 Family history of ischemic heart disease and other diseases of the circulatory system: Secondary | ICD-10-CM | POA: Insufficient documentation

## 2018-02-06 DIAGNOSIS — E785 Hyperlipidemia, unspecified: Secondary | ICD-10-CM | POA: Diagnosis not present

## 2018-02-06 DIAGNOSIS — K219 Gastro-esophageal reflux disease without esophagitis: Secondary | ICD-10-CM | POA: Diagnosis not present

## 2018-02-06 DIAGNOSIS — Z886 Allergy status to analgesic agent status: Secondary | ICD-10-CM | POA: Insufficient documentation

## 2018-02-06 DIAGNOSIS — F0390 Unspecified dementia without behavioral disturbance: Secondary | ICD-10-CM | POA: Insufficient documentation

## 2018-02-06 DIAGNOSIS — F333 Major depressive disorder, recurrent, severe with psychotic symptoms: Secondary | ICD-10-CM | POA: Diagnosis not present

## 2018-02-06 DIAGNOSIS — I5032 Chronic diastolic (congestive) heart failure: Secondary | ICD-10-CM | POA: Diagnosis not present

## 2018-02-06 DIAGNOSIS — I110 Hypertensive heart disease with heart failure: Secondary | ICD-10-CM | POA: Diagnosis not present

## 2018-02-06 DIAGNOSIS — Z87891 Personal history of nicotine dependence: Secondary | ICD-10-CM | POA: Insufficient documentation

## 2018-02-06 DIAGNOSIS — Z823 Family history of stroke: Secondary | ICD-10-CM | POA: Diagnosis not present

## 2018-02-06 MED ORDER — SUCCINYLCHOLINE CHLORIDE 20 MG/ML IJ SOLN
INTRAMUSCULAR | Status: AC
  Filled 2018-02-06: qty 1

## 2018-02-06 MED ORDER — SUCCINYLCHOLINE CHLORIDE 200 MG/10ML IV SOSY
PREFILLED_SYRINGE | INTRAVENOUS | Status: DC | PRN
  Administered 2018-02-06: 80 mg via INTRAVENOUS

## 2018-02-06 MED ORDER — SODIUM CHLORIDE 0.9 % IV SOLN: 500.0000 mL | Freq: Once | INTRAVENOUS | Status: AC

## 2018-02-06 MED ORDER — METHOHEXITAL SODIUM 100 MG/10ML IV SOSY
PREFILLED_SYRINGE | INTRAVENOUS | Status: DC | PRN
  Administered 2018-02-06: 60 mg via INTRAVENOUS

## 2018-02-06 MED ORDER — SODIUM CHLORIDE 0.9 % IV SOLN
500.0000 mL | Freq: Once | INTRAVENOUS | Status: AC
  Administered 2018-02-06: 500 mL via INTRAVENOUS

## 2018-02-06 MED ORDER — LABETALOL HCL 5 MG/ML IV SOLN
INTRAVENOUS | Status: DC | PRN
  Administered 2018-02-06: 20 mg via INTRAVENOUS

## 2018-02-06 MED ORDER — LABETALOL HCL 5 MG/ML IV SOLN
INTRAVENOUS | Status: AC
  Filled 2018-02-06: qty 4

## 2018-02-06 NOTE — Anesthesia Procedure Notes (Signed)
Date/Time: 02/06/2018 10:51 AM Performed by: Lily KocherPeralta, Alvin Diffee, CRNA Pre-anesthesia Checklist: Patient identified, Emergency Drugs available, Suction available and Patient being monitored Patient Re-evaluated:Patient Re-evaluated prior to induction Oxygen Delivery Method: Circle system utilized Preoxygenation: Pre-oxygenation with 100% oxygen Induction Type: IV induction Ventilation: Mask ventilation without difficulty and Mask ventilation throughout procedure Airway Equipment and Method: Bite block Placement Confirmation: positive ETCO2 Dental Injury: Teeth and Oropharynx as per pre-operative assessment

## 2018-02-06 NOTE — Discharge Instructions (Signed)
1)  The drugs that you have been given will stay in your system until tomorrow so for the       next 24 hours you should not:  A. Drive an automobile  B. Make any legal decisions  C. Drink any alcoholic beverages  2)  You may resume your regular meals upon return home.  3)  A responsible adult must take you home.  Someone should stay with you for a few          hours, then be available by phone for the remainder of the treatment day.  4)  You May experience any of the following symptoms:  Headache, Nausea and a dry mouth (due to the medications you were given),  temporary memory loss and some confusion, or sore muscles (a warm bath  should help this).  If you you experience any of these symptoms let us know on                your return visit.  5)  Report any of the following: any acute discomfort, severe headache, or temperature        greater than 100.5 F.   Also report any unusual redness, swelling, drainage, or pain         at your IV site.    You may report Symptoms to:  ECT [facility] [place] [facility]          Phone: [phone], ECT Department           or Dr. Shary Keylapac's office [phone]  6)  Your next ECT Treatment is Wednesday December 4   We will call 2 days prior to your scheduled appointment for arrival times.  7)  Nothing to eat or drink after midnight the night before your procedure.  8)  Take     With a sip of water the morning of your procedure.  9)  Other Instructions: Call [phone] to cancel the morning of your procedure due         to illness or emergency.  10) We will call within 72 hours to assess how you are feeling.

## 2018-02-06 NOTE — H&P (Signed)
Jenna Dudley is an 82 y.o. female.   Chief Complaint: No new complaint.  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 Bilateral carpal tunnel syndrome 07/27/14  . Bipolar disorder [facility](HCC)    geropsych admission to [facility] in Dec 2018  . Chronic diastolic CHF (congestive heart failure) (HCC)    a. 03/2017 Echo: EF 55-60%, no rwma, Gr1 DD, mild MR.  . Chronic low back pain 07/29/14  . Degenerative arthritis of lumbar spine 07/27/14  . Dementia (HCC)   . Depression   . Esophageal spasm 07/29/14  . GERD (gastroesophageal reflux disease)   . Hyperlipemia 07/29/14  . Hypertension   . Pneumonia   . Spinal stenosis 07/29/14    Past Surgical History:  Procedure Laterality Date  . HIP SURGERY Right   . INNER EAR SURGERY Right   . REPLACEMENT TOTAL KNEE BILATERAL Bilateral 07/27/14    Family History  Problem Relation Age of Onset  . Hypertension Mother   . Stroke Mother    Social History:  reports that she quit smoking about 42 years ago. Her smoking use included cigarettes. She has never used smokeless tobacco. She reports that she does not drink alcohol or use drugs.  Allergies:  Allergies  Allergen Reactions  . Aspirin Other (See Comments)    Unknown reaction  . Sulfa Antibiotics Other (See Comments)    Unknown reaction     (Not in a hospital admission)  No results found for this or any previous visit (from the past 48 hour(s)). No results found.  Review of Systems  Constitutional: Negative.   HENT: Negative.   Eyes: Negative.   Respiratory: Negative.   Cardiovascular: Negative.   Gastrointestinal: Negative.   Musculoskeletal: Positive for back pain. Negative for falls, joint pain, myalgias and neck pain.  Skin: Negative.   Neurological: Negative.   Psychiatric/Behavioral: Negative.     Blood pressure (!) 123/42, pulse 83,  temperature 98.6 F (37 C), temperature source Oral, resp. rate 16, SpO2 98 %. Physical Exam  Nursing note and vitals reviewed. Constitutional: She appears well-developed and well-nourished.  HENT:  Head: Normocephalic and atraumatic.  Eyes: Pupils are equal, round, and reactive to light. Conjunctivae are normal.  Neck: Normal range of motion.  Cardiovascular: Regular rhythm and normal heart sounds.  Respiratory: Effort normal. No respiratory distress.  GI: Soft.  Musculoskeletal: Normal range of motion.  Neurological: She is alert.  Skin: Skin is warm and dry.  Psychiatric: Judgment and thought content normal. Her affect is blunt. Her speech is delayed. She is slowed. She exhibits abnormal recent memory.     Assessment/Plan Treatment today and I think we can safely follow up and probably 3 weeks at this point.  Mordecai RasmussenJohn Jaleil Renwick, MD 02/06/2018, 10:43 AM

## 2018-02-06 NOTE — Procedures (Signed)
ECT SERVICES Physician's Interval Evaluation & Treatment Note  Patient Identification: Jenna Dudley MRN:  [id number] Date of Evaluation:  02/06/2018 TX #: 17  MADRS:   MMSE:   P.E. Findings:  No change to physical exam heart and lungs clear  Psychiatric Interval Note:  Mood good no paranoia not tearful not agitated  Subjective:  Patient is a 82 y.o. female seen for evaluation for Electroconvulsive Therapy. No specific complaint chronic back pain not any worse than usual  Treatment Summary:   [x]   Right Unilateral             []  Bilateral   % Energy : 0.3 ms 90%   Impedance: 2240 ohms  Seizure Energy Index: 2739 V squared  Postictal Suppression Index: 39%  Seizure Concordance Index: 84%  Medications  Pre Shock: Labetalol 20 mg Brevital 60 mg succinylcholine 80 mg  Post Shock:    Seizure Duration: 27 seconds EMG 45 seconds EEG   Comments: Follow-up in 3 weeks  Lungs:  [x]   Clear to auscultation               []  Other:   Heart:    [x]   Regular rhythm             []  irregular rhythm    [x]   Previous H&P reviewed, patient examined and there are NO CHANGES                 []   Previous H&P reviewed, patient examined and there are changes noted.   Mordecai RasmussenJohn Laquasia Pincus, MD 11/13/201910:44 AM

## 2018-02-06 NOTE — Transfer of Care (Signed)
Immediate Anesthesia Transfer of Care Note  Patient: Jenna Dudley  Procedure(s) Performed: ECT TX  Patient Location: PACU  Anesthesia Type:General  Level of Consciousness: sedated  Airway & Oxygen Therapy: Patient Spontanous Breathing and Patient connected to face mask oxygen  Post-op Assessment: Report given to RN and Post -op Vital signs reviewed and stable  Post vital signs: Reviewed and stable  Last Vitals:  Vitals Value Taken Time  BP 135/64 02/06/2018 11:02 AM  Temp    Pulse 91 02/06/2018 11:04 AM  Resp 30 02/06/2018 11:04 AM  SpO2 100 % 02/06/2018 11:04 AM  Vitals shown include unvalidated device data.  Last Pain:  Vitals:   02/06/18 0914  TempSrc: Oral  PainSc: 0-No pain         Complications: No apparent anesthesia complications

## 2018-02-06 NOTE — Anesthesia Preprocedure Evaluation (Signed)
Anesthesia Evaluation  Patient identified by MRN, date of birth, ID band Patient awake    Reviewed: Allergy & Precautions, H&P , NPO status , Patient's Chart, lab work & pertinent test results, reviewed documented beta blocker date and time   Airway Mallampati: II   Neck ROM: full    Dental  (+) Poor Dentition, Teeth Intact   Pulmonary pneumonia, former smoker,    Pulmonary exam normal        Cardiovascular Exercise Tolerance: Poor hypertension, On Medications +CHF  Normal cardiovascular exam Rhythm:regular Rate:Normal     Neuro/Psych PSYCHIATRIC DISORDERS Depression Bipolar Disorder Dementia  Neuromuscular disease    GI/Hepatic Neg liver ROS, GERD  ,  Endo/Other  negative endocrine ROS  Renal/GU negative Renal ROS  negative genitourinary   Musculoskeletal   Abdominal   Peds  Hematology negative hematology ROS (+)   Anesthesia Other Findings Past Medical History: 07/27/14: Bilateral carpal tunnel syndrome No date: Bipolar disorder (HCC)     Comment:  [facility] No date: Chronic diastolic CHF (congestive heart failure) (HCC)     Comment:  a. 03/2017 Echo: EF 55-60%, no rwma, Gr1 DD, mild MR. 07/29/14: Chronic low back pain 07/27/14: Degenerative arthritis of lumbar spine No date: Dementia (HCC) No date: Depression 07/29/14: Esophageal spasm No date: GERD (gastroesophageal reflux disease) 07/29/14: Hyperlipemia No date: Hypertension No date: Pneumonia 07/29/14: Spinal stenosis Past Surgical History: No date: HIP SURGERY; Right No date: INNER EAR SURGERY; Right 07/27/14: REPLACEMENT TOTAL KNEE BILATERAL; Bilateral   Reproductive/Obstetrics negative OB ROS                             Anesthesia Physical Anesthesia Plan  ASA: III  Anesthesia Plan: General   Post-op Pain Management:    Induction:   PONV Risk Score and Plan:   Airway Management  Planned:   Additional Equipment:   Intra-op Plan:   Post-operative Plan:   Informed Consent: I have reviewed the patients History and Physical, chart, labs and discussed the procedure including the risks, benefits and alternatives for the proposed anesthesia with the patient or authorized representative who has indicated his/her understanding and acceptance.   Dental Advisory Given  Plan Discussed with: CRNA  Anesthesia Plan Comments:         Anesthesia Quick Evaluation

## 2018-02-07 NOTE — Anesthesia Postprocedure Evaluation (Signed)
Anesthesia Post Note  Patient: Jenna Dudley  Procedure(s) Performed: ECT TX  Patient location during evaluation: PACU Anesthesia Type: General Level of consciousness: awake and alert Pain management: pain level controlled Vital Signs Assessment: post-procedure vital signs reviewed and stable Respiratory status: spontaneous breathing, nonlabored ventilation, respiratory function stable and patient connected to nasal cannula oxygen Cardiovascular status: blood pressure returned to baseline and stable Postop Assessment: no apparent nausea or vomiting Anesthetic complications: no     Last Vitals:  Vitals:   02/06/18 1113 02/06/18 1150  BP: (!) 136/56 107/65  Pulse: 92 85  Resp: (!) 23 18  Temp:  36.7 C  SpO2: 94%     Last Pain:  Vitals:   02/06/18 1150  TempSrc: Oral  PainSc: 0-No pain                 Yevette EdwardsJames G Aeden Matranga

## 2018-02-25 ENCOUNTER — Telehealth: Payer: Self-pay | Admitting: *Deleted

## 2018-02-26 ENCOUNTER — Other Ambulatory Visit: Payer: Self-pay | Admitting: Psychiatry

## 2018-02-27 ENCOUNTER — Encounter
Admission: RE | Admit: 2018-02-27 | Discharge: 2018-02-27 | Disposition: A | Discharge status: Home / Self Care | Payer: Medicare Other | Source: Ambulatory Visit | Attending: Psychiatry | Admitting: Psychiatry

## 2018-02-27 ENCOUNTER — Encounter: Payer: Self-pay | Admitting: Anesthesiology

## 2018-02-27 DIAGNOSIS — F0390 Unspecified dementia without behavioral disturbance: Secondary | ICD-10-CM | POA: Diagnosis not present

## 2018-02-27 DIAGNOSIS — F319 Bipolar disorder, unspecified: Secondary | ICD-10-CM | POA: Diagnosis not present

## 2018-02-27 DIAGNOSIS — K219 Gastro-esophageal reflux disease without esophagitis: Secondary | ICD-10-CM | POA: Insufficient documentation

## 2018-02-27 DIAGNOSIS — I5032 Chronic diastolic (congestive) heart failure: Secondary | ICD-10-CM | POA: Insufficient documentation

## 2018-02-27 DIAGNOSIS — F332 Major depressive disorder, 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 features: Secondary | ICD-10-CM

## 2018-02-27 DIAGNOSIS — I110 Hypertensive heart disease with heart failure: Secondary | ICD-10-CM | POA: Insufficient documentation

## 2018-02-27 DIAGNOSIS — Z87891 Personal history of nicotine dependence: Secondary | ICD-10-CM | POA: Diagnosis not present

## 2018-02-27 DIAGNOSIS — G8929 Other chronic pain: Secondary | ICD-10-CM | POA: Insufficient documentation

## 2018-02-27 MED ORDER — METHOHEXITAL SODIUM 100 MG/10ML IV SOSY
PREFILLED_SYRINGE | INTRAVENOUS | Status: DC | PRN
  Administered 2018-02-27: 60 mg via INTRAVENOUS

## 2018-02-27 MED ORDER — SUCCINYLCHOLINE CHLORIDE 20 MG/ML IJ SOLN
INTRAMUSCULAR | Status: AC
  Filled 2018-02-27: qty 1

## 2018-02-27 MED ORDER — LABETALOL HCL 5 MG/ML IV SOLN
INTRAVENOUS | Status: AC
  Filled 2018-02-27: qty 4

## 2018-02-27 MED ORDER — KETOROLAC TROMETHAMINE 30 MG/ML IJ SOLN: 15.0000 mg | Freq: Once | INTRAMUSCULAR | Status: DC

## 2018-02-27 MED ORDER — LABETALOL HCL 5 MG/ML IV SOLN
INTRAVENOUS | Status: DC | PRN
  Administered 2018-02-27: 20 mg via INTRAVENOUS

## 2018-02-27 MED ORDER — SODIUM CHLORIDE 0.9 % IV SOLN
500.0000 mL | Freq: Once | INTRAVENOUS | Status: AC
  Administered 2018-02-27: 500 mL via INTRAVENOUS

## 2018-02-27 MED ORDER — KETOROLAC TROMETHAMINE 30 MG/ML IJ SOLN
INTRAMUSCULAR | Status: AC
  Filled 2018-02-27: qty 1

## 2018-02-27 MED ORDER — SUCCINYLCHOLINE CHLORIDE 200 MG/10ML IV SOSY
PREFILLED_SYRINGE | INTRAVENOUS | Status: DC | PRN
  Administered 2018-02-27: 80 mg via INTRAVENOUS

## 2018-02-27 NOTE — Discharge Instructions (Signed)
1)  The drugs that you have been given will stay in your system until tomorrow so for the       next 24 hours you should not:  A. Drive an automobile  B. Make any legal decisions  C. Drink any alcoholic beverages  2)  You may resume your regular meals upon return home.  3)  A responsible adult must take you home.  Someone should stay with you for a few          hours, then be available by phone for the remainder of the treatment day.  4)  You May experience any of the following symptoms:  Headache, Nausea and a dry mouth (due to the medications you were given),  temporary memory loss and some confusion, or sore muscles (a warm bath  should help this).  If you you experience any of these symptoms let us know on                your return visit.  5)  Report any of the following: any acute discomfort, severe headache, or temperature        greater than 100.5 F.   Also report any unusual redness, swelling, drainage, or pain         at your IV site.    You may report Symptoms to:  ECT PROGRAM- [place] [facility]          Phone: [phone], ECT Department           or Dr. Shary Keylapac's office [phone]  6)  Your next ECT Treatment is Friday January 3   We will call 2 days prior to your scheduled appointment for arrival times.  7)  Nothing to eat or drink after midnight the night before your procedure.  8)  Take    With a sip of water the morning of your procedure.  9)  Other Instructions: Call [phone] to cancel the morning of your procedure due         to illness or emergency.  10) We will call within 72 hours to assess how you are feeling.

## 2018-02-27 NOTE — Transfer of Care (Signed)
Immediate Anesthesia Transfer of Care Note  Patient: Jenna Dudley  Procedure(s) Performed: ECT TX  Patient Location: PACU  Anesthesia Type:General  Level of Consciousness: sedated  Airway & Oxygen Therapy: Patient Spontanous Breathing and Patient connected to face mask oxygen  Post-op Assessment: Report given to RN and Post -op Vital signs reviewed and stable  Post vital signs: Reviewed and stable  Last Vitals:  Vitals Value Taken Time  BP 126/50 02/27/2018 11:46 AM  Temp    Pulse 94 02/27/2018 11:48 AM  Resp 19 02/27/2018 11:48 AM  SpO2 99 % 02/27/2018 11:48 AM  Vitals shown include unvalidated device data.  Last Pain:  Vitals:   02/27/18 0827  TempSrc:   PainSc: 0-No pain         Complications: No apparent anesthesia complications

## 2018-02-27 NOTE — Anesthesia Post-op Follow-up Note (Signed)
Anesthesia QCDR form completed.        

## 2018-02-27 NOTE — Anesthesia Postprocedure Evaluation (Signed)
Anesthesia Post Note  Patient: Jenna Dudley  Procedure(s) Performed: ECT TX  Patient location during evaluation: PACU Anesthesia Type: General Level of consciousness: awake and alert Pain management: pain level controlled Vital Signs Assessment: post-procedure vital signs reviewed and stable Respiratory status: spontaneous breathing, nonlabored ventilation and respiratory function stable Cardiovascular status: blood pressure returned to baseline and stable Postop Assessment: no apparent nausea or vomiting Anesthetic complications: no     Last Vitals:  Vitals:   02/27/18 1215 02/27/18 1223  BP: 121/80 121/75  Pulse: 86 80  Resp: 19 16  Temp:  36.7 C  SpO2: 93%     Last Pain:  Vitals:   02/27/18 1223  TempSrc: Oral  PainSc: 0-No pain                 Christia ReadingScott T Hjalmar Ballengee

## 2018-02-27 NOTE — Procedures (Signed)
ECT SERVICES Physician's Interval Evaluation & Treatment Note  Patient Identification: Jenna Dudley MRN:  [id number] Date of Evaluation:  02/27/2018 TX #: 18  MADRS:   MMSE:   P.E. Findings:  No change to physical exam  Psychiatric Interval Note:  Mood and affect are upbeat  Subjective:  Patient is a 82 y.o. female seen for evaluation for Electroconvulsive Therapy. Patient has no new complaints.  Chronic back pain no mood complaints  Treatment Summary:   [x]   Right Unilateral             []  Bilateral   % Energy : 0.3 ms 90%   Impedance: 1420 ohms  Seizure Energy Index: 27,102 V squared  Postictal Suppression Index: Around 26%  Seizure Concordance Index: 60%  Medications  Pre Shock: Labetalol 20 mg Brevital 60 mg succinylcholine 80 mg  Post Shock:    Seizure Duration: Seizure duration about 25 seconds by movement although for some reason the EMG did not record.  EEG by my reading is about 45 seconds.   Comments: Follow-up on January 3 because of the holidays  Lungs:  [x]   Clear to auscultation               []  Other:   Heart:    [x]   Regular rhythm             []  irregular rhythm    [x]   Previous H&P reviewed, patient examined and there are NO CHANGES                 []   Previous H&P reviewed, patient examined and there are changes noted.   Mordecai RasmussenJohn Clapacs, MD 12/4/201911:25 AM

## 2018-02-27 NOTE — Anesthesia Procedure Notes (Signed)
Date/Time: 02/27/2018 11:35 AM Performed by: Lily KocherPeralta, Brandonlee Navis, CRNA Pre-anesthesia Checklist: Patient identified, Emergency Drugs available, Suction available and Patient being monitored Patient Re-evaluated:Patient Re-evaluated prior to induction Oxygen Delivery Method: Circle system utilized Preoxygenation: Pre-oxygenation with 100% oxygen Induction Type: IV induction Ventilation: Mask ventilation without difficulty and Mask ventilation throughout procedure Airway Equipment and Method: Bite block Placement Confirmation: positive ETCO2 Dental Injury: Teeth and Oropharynx as per pre-operative assessment

## 2018-02-27 NOTE — Anesthesia Preprocedure Evaluation (Addendum)
Anesthesia Evaluation  Patient identified by MRN, date of birth, ID band Patient awake    Reviewed: Allergy & Precautions, H&P , NPO status , reviewed documented beta blocker date and time   Airway Mallampati: III  TM Distance: >3 FB Neck ROM: full    Dental  (+) Missing, Poor Dentition   Pulmonary pneumonia, resolved, former smoker,    Pulmonary exam normal        Cardiovascular hypertension, +CHF  Normal cardiovascular exam     Neuro/Psych    GI/Hepatic GERD  Controlled,  Endo/Other    Renal/GU      Musculoskeletal  (+) Arthritis ,   Abdominal   Peds  Hematology   Anesthesia Other Findings Past Medical History: 07/27/14: Bilateral carpal tunnel syndrome No date: Bipolar disorder [facility](HCC)     Comment:  geropsych admission [facility] in Dec 2018 No date: Chronic diastolic CHF (congestive heart failure) (HCC)     Comment:  a. 03/2017 Echo: EF 55-60%, no rwma, Gr1 DD, mild MR. 07/29/14: Chronic low back pain 07/27/14: Degenerative arthritis of lumbar spine No date: Dementia (HCC) No date: Depression 07/29/14: Esophageal spasm No date: GERD (gastroesophageal reflux disease) 07/29/14: Hyperlipemia No date: Hypertension No date: Pneumonia 07/29/14: Spinal stenosis  Past Surgical History: No date: HIP SURGERY; Right No date: INNER EAR SURGERY; Right 07/27/14: REPLACEMENT TOTAL KNEE BILATERAL; Bilateral     Reproductive/Obstetrics                             Anesthesia Physical Anesthesia Plan  ASA: III  Anesthesia Plan: General   Post-op Pain Management:    Induction: Intravenous  PONV Risk Score and Plan: 3 and TIVA  Airway Management Planned: Natural Airway and Simple Face Mask  Additional Equipment:   Intra-op Plan:   Post-operative Plan:   Informed Consent: I have reviewed the patients History and Physical, chart, labs and discussed the procedure including the risks,  benefits and alternatives for the proposed anesthesia with the patient or authorized representative who has indicated his/her understanding and acceptance.   Dental Advisory Given  Plan Discussed with: CRNA  Anesthesia Plan Comments:        Anesthesia Quick Evaluation

## 2018-02-27 NOTE — H&P (Signed)
Galen DaftJoan M Casebeer is an 82 y.o. female.   Chief Complaint: Patient is feeling well.  Chronic back pain.  No new mood complaints HPI: History of recurrent severe psychotic depression doing well again on medicine and ECT  Past Medical History:  Diagnosis Date  . Bilateral carpal tunnel syndrome 07/27/14  . Bipolar disorder [facility](HCC)    geropsych admission [facility] in Dec 2018  . Chronic diastolic CHF (congestive heart failure) (HCC)    a. 03/2017 Echo: EF 55-60%, no rwma, Gr1 DD, mild MR.  . Chronic low back pain 07/29/14  . Degenerative arthritis of lumbar spine 07/27/14  . Dementia (HCC)   . Depression   . Esophageal spasm 07/29/14  . GERD (gastroesophageal reflux disease)   . Hyperlipemia 07/29/14  . Hypertension   . Pneumonia   . Spinal stenosis 07/29/14    Past Surgical History:  Procedure Laterality Date  . HIP SURGERY Right   . INNER EAR SURGERY Right   . REPLACEMENT TOTAL KNEE BILATERAL Bilateral 07/27/14    Family History  Problem Relation Age of Onset  . Hypertension Mother   . Stroke Mother    Social History:  reports that she quit smoking about 42 years ago. Her smoking use included cigarettes. She has never used smokeless tobacco. She reports that she does not drink alcohol or use drugs.  Allergies:  Allergies  Allergen Reactions  . Aspirin Other (See Comments)    Unknown reaction  . Sulfa Antibiotics Other (See Comments)    Unknown reaction     (Not in a hospital admission)  No results found for this or any previous visit (from the past 48 hour(s)). No results found.  Review of Systems  Constitutional: Negative.   HENT: Negative.   Eyes: Negative.   Respiratory: Negative.   Cardiovascular: Negative.   Gastrointestinal: Negative.   Musculoskeletal: Positive for back pain.  Skin: Negative.   Neurological: Negative.   Psychiatric/Behavioral: Negative.     Blood pressure (!) 141/54, pulse 82, temperature 97.6 F (36.4 C), temperature source Oral, resp. rate  16, SpO2 98 %. Physical Exam  Nursing note and vitals reviewed. Constitutional: She appears well-developed and well-nourished.  HENT:  Head: Normocephalic and atraumatic.  Eyes: Pupils are equal, round, and reactive to light. Conjunctivae are normal.  Neck: Normal range of motion.  Cardiovascular: Regular rhythm and normal heart sounds.  Respiratory: Effort normal. No respiratory distress.  GI: Soft.  Musculoskeletal: Normal range of motion.  Neurological: She is alert.  Skin: Skin is warm and dry.  Psychiatric: She has a normal mood and affect. Her behavior is normal. Judgment and thought content normal.     Assessment/Plan Follow-up in another 2 to 3 weeks.  Mordecai RasmussenJohn Launa Goedken, MD 02/27/2018, 11:23 AM

## 2018-03-07 ENCOUNTER — Other Ambulatory Visit: Payer: Self-pay | Admitting: Cardiovascular Disease

## 2018-03-25 ENCOUNTER — Telehealth: Payer: Self-pay

## 2018-03-31 ENCOUNTER — Other Ambulatory Visit: Payer: Self-pay | Admitting: Psychiatry

## 2018-04-05 ENCOUNTER — Other Ambulatory Visit: Payer: Self-pay | Admitting: Psychiatry

## 2018-04-09 ENCOUNTER — Other Ambulatory Visit: Payer: Self-pay | Admitting: Psychiatry

## 2018-04-10 ENCOUNTER — Encounter: Payer: Self-pay | Admitting: Anesthesiology

## 2018-04-10 ENCOUNTER — Encounter
Admission: RE | Admit: 2018-04-10 | Discharge: 2018-04-10 | Disposition: A | Discharge status: Home / Self Care | Payer: Medicare Other | Source: Ambulatory Visit | Attending: Psychiatry | Admitting: Psychiatry

## 2018-04-10 DIAGNOSIS — K219 Gastro-esophageal reflux disease without esophagitis: Secondary | ICD-10-CM | POA: Diagnosis not present

## 2018-04-10 DIAGNOSIS — I110 Hypertensive heart disease with heart failure: Secondary | ICD-10-CM | POA: Insufficient documentation

## 2018-04-10 DIAGNOSIS — F332 Major depressive disorder, 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 features: Secondary | ICD-10-CM | POA: Diagnosis not present

## 2018-04-10 DIAGNOSIS — Z882 Allergy status to sulfonamides status: Secondary | ICD-10-CM | POA: Diagnosis not present

## 2018-04-10 DIAGNOSIS — G8929 Other chronic pain: Secondary | ICD-10-CM | POA: Diagnosis not present

## 2018-04-10 DIAGNOSIS — Z87891 Personal history of nicotine dependence: Secondary | ICD-10-CM | POA: Insufficient documentation

## 2018-04-10 DIAGNOSIS — F0390 Unspecified dementia without behavioral disturbance: Secondary | ICD-10-CM | POA: Diagnosis not present

## 2018-04-10 DIAGNOSIS — F319 Bipolar disorder, unspecified: Secondary | ICD-10-CM | POA: Diagnosis not present

## 2018-04-10 DIAGNOSIS — I5032 Chronic diastolic (congestive) heart failure: Secondary | ICD-10-CM | POA: Insufficient documentation

## 2018-04-10 MED ORDER — LABETALOL HCL 5 MG/ML IV SOLN
INTRAVENOUS | Status: AC
  Filled 2018-04-10: qty 4

## 2018-04-10 MED ORDER — SUCCINYLCHOLINE CHLORIDE 200 MG/10ML IV SOSY
PREFILLED_SYRINGE | INTRAVENOUS | Status: DC | PRN
  Administered 2018-04-10: 80 mg via INTRAVENOUS

## 2018-04-10 MED ORDER — METHOHEXITAL SODIUM 100 MG/10ML IV SOSY
PREFILLED_SYRINGE | INTRAVENOUS | Status: DC | PRN
  Administered 2018-04-10: 60 mg via INTRAVENOUS

## 2018-04-10 MED ORDER — SODIUM CHLORIDE 0.9 % IV SOLN
500.0000 mL | Freq: Once | INTRAVENOUS | Status: AC
  Administered 2018-04-10: 500 mL via INTRAVENOUS

## 2018-04-10 MED ORDER — LABETALOL HCL 5 MG/ML IV SOLN
INTRAVENOUS | Status: DC | PRN
  Administered 2018-04-10: 20 mg via INTRAVENOUS

## 2018-04-10 MED ORDER — METHOHEXITAL SODIUM 0.5 G IJ SOLR
INTRAMUSCULAR | Status: AC
  Filled 2018-04-10: qty 500

## 2018-04-10 MED ORDER — KETOROLAC TROMETHAMINE 30 MG/ML IJ SOLN: 15.0000 mg | Freq: Once | INTRAMUSCULAR | Status: DC

## 2018-04-10 MED ORDER — SUCCINYLCHOLINE CHLORIDE 20 MG/ML IJ SOLN
INTRAMUSCULAR | Status: AC
  Filled 2018-04-10: qty 1

## 2018-04-10 NOTE — Anesthesia Post-op Follow-up Note (Signed)
Anesthesia QCDR form completed.        

## 2018-04-10 NOTE — Anesthesia Procedure Notes (Signed)
Performed by: Roberta Kelly, CRNA Pre-anesthesia Checklist: Patient identified, Emergency Drugs available, Suction available and Patient being monitored Patient Re-evaluated:Patient Re-evaluated prior to induction Oxygen Delivery Method: Circle system utilized Preoxygenation: Pre-oxygenation with 100% oxygen Induction Type: IV induction Ventilation: Mask ventilation without difficulty and Mask ventilation throughout procedure Airway Equipment and Method: Bite block Placement Confirmation: positive ETCO2 Dental Injury: Teeth and Oropharynx as per pre-operative assessment        

## 2018-04-10 NOTE — H&P (Signed)
Jenna Dudley is an 83 y.o. female.   Chief Complaint: Patient herself has no specific complaint.  Mentions her chronic back pain but it is no worse than usual.  Denies any depressive or psychotic symptoms HPI: Patient with longstanding problems with depression and psychotic features who does well with medication and maintenance ECT  Past Medical History:  Diagnosis Date  . Bilateral carpal tunnel syndrome 07/27/14  . Bipolar disorder [facility])    geropsych admission to [facility] in Dec 2018  . Chronic diastolic CHF (congestive heart failure) (HCC)    a. 03/2017 Echo: EF 55-60%, no rwma, Gr1 DD, mild MR.  . Chronic low back pain 07/29/14  . Degenerative arthritis of lumbar spine 07/27/14  . Dementia (HCC)   . Depression   . Esophageal spasm 07/29/14  . GERD (gastroesophageal reflux disease)   . Hyperlipemia 07/29/14  . Hypertension   . Pneumonia   . Spinal stenosis 07/29/14    Past Surgical History:  Procedure Laterality Date  . HIP SURGERY Right   . INNER EAR SURGERY Right   . REPLACEMENT TOTAL KNEE BILATERAL Bilateral 07/27/14    Family History  Problem Relation Age of Onset  . Hypertension Mother   . Stroke Mother    Social History:  reports that she quit smoking about 42 years ago. Her smoking use included cigarettes. She has never used smokeless tobacco. She reports that she does not drink alcohol or use drugs.  Allergies:  Allergies  Allergen Reactions  . Aspirin Other (See Comments)    Unknown reaction  . Sulfa Antibiotics Other (See Comments)    Unknown reaction    (Not in a hospital admission)   No results found for this or any previous visit (from the past 48 hour(s)). No results found.  Review of Systems  Constitutional: Negative.   HENT: Negative.   Eyes: Negative.   Respiratory: Negative.   Cardiovascular: Negative.   Gastrointestinal: Negative.   Musculoskeletal: Positive for back pain.  Skin: Negative.   Neurological: Negative.   Psychiatric/Behavioral:  Negative.     Blood pressure (!) 129/58, pulse 79, temperature 98.2 F (36.8 C), temperature source Oral, resp. rate 16, height 5\' 4"  (1.626 m), weight 69.9 kg, SpO2 91 %. Physical Exam  Nursing note and vitals reviewed. Constitutional: She appears well-developed and well-nourished.  HENT:  Head: Normocephalic and atraumatic.  Eyes: Pupils are equal, round, and reactive to light. Conjunctivae are normal.  Neck: Normal range of motion.  Cardiovascular: Regular rhythm and normal heart sounds.  Respiratory: Effort normal.  GI: Soft.  Musculoskeletal: Normal range of motion.  Neurological: She is alert.  Skin: Skin is warm and dry.  Psychiatric: Her speech is normal and behavior is normal. Judgment and thought content normal. Her affect is blunt. She exhibits abnormal recent memory.     Assessment/Plan Spoke with the patient and with her husband today.  Both agree that she seems to be in a very stable phase.  No change to plan except I think we can go 6 weeks to our next treatment and continue medication.  Mordecai Rasmussen, MD 04/10/2018, 3:17 PM

## 2018-04-10 NOTE — Anesthesia Postprocedure Evaluation (Signed)
Anesthesia Post Note  Patient: Jenna Dudley  Procedure(s) Performed: ECT TX  Patient location during evaluation: PACU Anesthesia Type: General Level of consciousness: awake and alert Pain management: pain level controlled Vital Signs Assessment: post-procedure vital signs reviewed and stable Respiratory status: spontaneous breathing, nonlabored ventilation, respiratory function stable and patient connected to nasal cannula oxygen Cardiovascular status: blood pressure returned to baseline and stable Postop Assessment: no apparent nausea or vomiting Anesthetic complications: no     Last Vitals:  Vitals:   04/10/18 1128 04/10/18 1148  BP: (!) 140/58 (!) 129/58  Pulse: 80 79  Resp: 20 16  Temp:  36.8 C  SpO2: 91%     Last Pain:  Vitals:   04/10/18 1148  TempSrc: Oral  PainSc: 0-No pain                 Cleda Mccreedy Demaryius Imran

## 2018-04-10 NOTE — Procedures (Signed)
ECT SERVICES Physician's Interval Evaluation & Treatment Note  Patient Identification: Jenna Dudley MRN:  [id number] Date of Evaluation:  04/10/2018 TX #: [id number]  MADRS:   MMSE:   P.E. Findings:  No change to physical exam.  Arthritis notable but it has been for quite a while  Psychiatric Interval Note:  Patient's presentation is the same as it is whenever she is doing well.  A little shy and anxious but not confused or agitated no suicidality no psychosis  Subjective:  Patient is a 83 y.o. female seen for evaluation for Electroconvulsive Therapy. Mild neck pain no psychiatric complaints  Treatment Summary:   [x]   Right Unilateral             []  Bilateral   % Energy : 0.3 ms 90%   Impedance: 1930 ohms  Seizure Energy Index: 3040 V squared  Postictal Suppression Index: 85%  Seizure Concordance Index: 68%  Medications  Pre Shock: Labetalol 20 mg Brevital 60 mg succinylcholine 80 mg  Post Shock:    Seizure Duration: 24 seconds by motor movement 47 seconds by EEG   Comments: Follow-up in about 6 weeks  Lungs:  [x]   Clear to auscultation               []  Other:   Heart:    [x]   Regular rhythm             []  irregular rhythm    []   Previous H&P reviewed, patient examined and there are NO CHANGES                 []   Previous H&P reviewed, patient examined and there are changes noted.   Mordecai Rasmussen, MD 1/15/20203:18 PM

## 2018-04-10 NOTE — Discharge Instructions (Signed)
1)  The drugs that you have been given will stay in your system until tomorrow so for the       next 24 hours you should not:  A. Drive an automobile  B. Make any legal decisions  C. Drink any alcoholic beverages  2)  You may resume your regular meals upon return home.  3)  A responsible adult must take you home.  Someone should stay with you for a few          hours, then be available by phone for the remainder of the treatment day.  4)  You May experience any of the following symptoms:  Headache, Nausea and a dry mouth (due to the medications you were given),  temporary memory loss and some confusion, or sore muscles (a warm bath  should help this).  If you you experience any of these symptoms let us know on                your return visit.  5)  Report any of the following: any acute discomfort, severe headache, or temperature        greater than 100.5 F.   Also report any unusual redness, swelling, drainage, or pain         at your IV site.    You may report Symptoms to:  ECT PROGRAM- [place] [facility]          Phone: [phone], ECT Department           or Dr. Shary Keylapac's office [phone]  6)  Your next ECT Treatment is Wednesday February 26   We will call 2 days prior to your scheduled appointment for arrival times.  7)  Nothing to eat or drink after midnight the night before your procedure.  8)  Take      With a sip of water the morning of your procedure.  9)  Other Instructions: Call [phone] to cancel the morning of your procedure due         to illness or emergency.  10) We will call within 72 hours to assess how you are feeling.

## 2018-04-10 NOTE — Transfer of Care (Signed)
Immediate Anesthesia Transfer of Care Note  Patient: Jenna Dudley  Procedure(s) Performed: ECT TX  Patient Location: PACU  Anesthesia Type:General  Level of Consciousness: sedated  Airway & Oxygen Therapy: Patient Spontanous Breathing and Patient connected to face mask oxygen  Post-op Assessment: Report given to RN and Post -op Vital signs reviewed and stable  Post vital signs: Reviewed and stable  Last Vitals:  Vitals Value Taken Time  BP 148/69 04/10/2018 11:10 AM  Temp 36.8 C 04/10/2018 11:08 AM  Pulse 84 04/10/2018 11:10 AM  Resp 27 04/10/2018 11:10 AM  SpO2 100 % 04/10/2018 11:10 AM  Vitals shown include unvalidated device data.  Last Pain:  Vitals:   04/10/18 1108  TempSrc:   PainSc: Asleep         Complications: No apparent anesthesia complications

## 2018-04-10 NOTE — Anesthesia Preprocedure Evaluation (Signed)
Anesthesia Evaluation  Patient identified by MRN, date of birth, ID band Patient awake    Reviewed: Allergy & Precautions, H&P , NPO status , reviewed documented beta blocker date and time   History of Anesthesia Complications Negative for: history of anesthetic complications  Airway Mallampati: III  TM Distance: >3 FB Neck ROM: full    Dental  (+) Missing, Poor Dentition   Pulmonary former smoker,    Pulmonary exam normal        Cardiovascular hypertension, +CHF  Normal cardiovascular exam     Neuro/Psych PSYCHIATRIC DISORDERS Depression Bipolar Disorder Dementia  Neuromuscular disease    GI/Hepatic GERD  ,  Endo/Other    Renal/GU      Musculoskeletal  (+) Arthritis ,   Abdominal   Peds  Hematology   Anesthesia Other Findings Past Medical History: 07/27/14: Bilateral carpal tunnel syndrome No date: Bipolar disorder (HCC)     Comment:  geropsych admission to [place] in Dec 2018 No date: Chronic diastolic CHF (congestive heart failure) (HCC)     Comment:  a. 03/2017 Echo: EF 55-60%, no rwma, Gr1 DD, mild MR. 07/29/14: Chronic low back pain 07/27/14: Degenerative arthritis of lumbar spine No date: Dementia No date: Depression 07/29/14: Esophageal spasm No date: GERD (gastroesophageal reflux disease) 07/29/14: Hyperlipemia No date: Hypertension 07/29/14: Spinal stenosis  Past Surgical History: No date: HIP SURGERY; Right No date: INNER EAR SURGERY; Right 07/27/14: REPLACEMENT TOTAL KNEE BILATERAL; Bilateral  BMI    Body Mass Index:  28.71 kg/m      Reproductive/Obstetrics                             Anesthesia Physical  Anesthesia Plan  ASA: III  Anesthesia Plan: General   Post-op Pain Management:    Induction: Intravenous  PONV Risk Score and Plan: Treatment may vary due to age or medical condition and TIVA  Airway Management Planned: Nasal Cannula and Natural  Airway  Additional Equipment:   Intra-op Plan:   Post-operative Plan:   Informed Consent: I have reviewed the patients History and Physical, chart, labs and discussed the procedure including the risks, benefits and alternatives for the proposed anesthesia with the patient or authorized representative who has indicated his/her understanding and acceptance.   Dental Advisory Given  Plan Discussed with: CRNA  Anesthesia Plan Comments: (Patient consented for risks of anesthesia including but not limited to:  - adverse reactions to medications - risk of intubation if required - damage to teeth, lips or other oral mucosa - sore throat or hoarseness - Damage to heart, brain, lungs or loss of life  Patient voiced understanding.)        Anesthesia Quick Evaluation  

## 2018-04-22 ENCOUNTER — Telehealth: Payer: Self-pay | Admitting: *Deleted

## 2018-04-23 ENCOUNTER — Emergency Department: Payer: Medicare Other

## 2018-04-23 ENCOUNTER — Emergency Department
Admission: EM | Admit: 2018-04-23 | Discharge: 2018-04-23 | Disposition: A | Discharge status: Home / Self Care | Payer: Medicare Other | Attending: Emergency Medicine | Admitting: Emergency Medicine

## 2018-04-23 ENCOUNTER — Other Ambulatory Visit: Payer: Self-pay

## 2018-04-23 DIAGNOSIS — J986 Disorders of diaphragm: Secondary | ICD-10-CM | POA: Insufficient documentation

## 2018-04-23 DIAGNOSIS — S01511A Laceration without foreign body of lip, initial encounter: Secondary | ICD-10-CM

## 2018-04-23 DIAGNOSIS — S0242XA Fracture of alveolus of maxilla, initial encounter for closed fracture: Secondary | ICD-10-CM | POA: Diagnosis not present

## 2018-04-23 DIAGNOSIS — Y998 Other external cause status: Secondary | ICD-10-CM | POA: Diagnosis not present

## 2018-04-23 DIAGNOSIS — M25551 Pain in right hip: Secondary | ICD-10-CM | POA: Insufficient documentation

## 2018-04-23 DIAGNOSIS — Y9389 Activity, other specified: Secondary | ICD-10-CM | POA: Diagnosis not present

## 2018-04-23 DIAGNOSIS — Y92018 Other place in single-family (private) house as the place of occurrence of the external cause: Secondary | ICD-10-CM | POA: Insufficient documentation

## 2018-04-23 DIAGNOSIS — W01198A Fall on same level from slipping, tripping and stumbling with subsequent striking against other object, initial encounter: Secondary | ICD-10-CM | POA: Diagnosis not present

## 2018-04-23 DIAGNOSIS — I110 Hypertensive heart disease with heart failure: Secondary | ICD-10-CM | POA: Diagnosis not present

## 2018-04-23 DIAGNOSIS — Z96653 Presence of artificial knee joint, bilateral: Secondary | ICD-10-CM | POA: Insufficient documentation

## 2018-04-23 DIAGNOSIS — W19XXXA Unspecified fall, initial encounter: Secondary | ICD-10-CM

## 2018-04-23 DIAGNOSIS — F0390 Unspecified dementia without behavioral disturbance: Secondary | ICD-10-CM | POA: Insufficient documentation

## 2018-04-23 DIAGNOSIS — Z87891 Personal history of nicotine dependence: Secondary | ICD-10-CM | POA: Insufficient documentation

## 2018-04-23 DIAGNOSIS — F319 Bipolar disorder, unspecified: Secondary | ICD-10-CM | POA: Insufficient documentation

## 2018-04-23 DIAGNOSIS — I5032 Chronic diastolic (congestive) heart failure: Secondary | ICD-10-CM | POA: Diagnosis not present

## 2018-04-23 DIAGNOSIS — Z79899 Other long term (current) drug therapy: Secondary | ICD-10-CM | POA: Diagnosis not present

## 2018-04-23 DIAGNOSIS — S0993XA Unspecified injury of face, initial encounter: Secondary | ICD-10-CM | POA: Diagnosis present

## 2018-04-23 MED ORDER — LIDOCAINE-EPINEPHRINE-TETRACAINE (LET) SOLUTION
3.0000 mL | Freq: Once | NASAL | Status: AC
  Administered 2018-04-23: 3 mL via TOPICAL
  Filled 2018-04-23: qty 3

## 2018-04-23 MED ORDER — ACETAMINOPHEN 500 MG PO TABS
1000.0000 mg | ORAL_TABLET | Freq: Once | ORAL | Status: AC
  Administered 2018-04-23: 1000 mg via ORAL
  Filled 2018-04-23: qty 2

## 2018-04-23 NOTE — ED Triage Notes (Addendum)
Pt arrived via [facility] from home with mechanical fall. No LOC or blood thinners. Pt has laceration to nose and upper lip. No is severely anxious. NAD at present.

## 2018-04-23 NOTE — ED Provider Notes (Signed)
[facility] Emergency Department Provider Note  ____________________________________________  Time seen: Approximately 4:59 PM  I have reviewed the triage vital signs and the nursing notes.   HISTORY  Chief Complaint Fall   HPI Jenna Dudley is a 83 y.o. female with history as listed below who presents for evaluation of mechanical fall.  Patient reports that she was going into the laundry room when she slipped and fell.  She reports hitting her face onto the cabinet.  After that she felt dizzy and had a second fall.  She is complaining of pain on the right hip, her nose, and her face.  She sustained a lip laceration.  She is not on blood thinners.  She denies neck pain or back pain.  She denies any presyncopal symptoms such as headache, dizziness, chest pain or shortness of breath and reports that the fall was mechanical in nature.  Past Medical History:  Diagnosis Date  . Bilateral carpal tunnel syndrome 07/27/14  . Bipolar disorder [facility])    geropsych admission to [facility] in Dec 2018  . Chronic diastolic CHF (congestive heart failure) (HCC)    a. 03/2017 Echo: EF 55-60%, no rwma, Gr1 DD, mild MR.  . Chronic low back pain 07/29/14  . Degenerative arthritis of lumbar spine 07/27/14  . Dementia (HCC)   . Depression   . Esophageal spasm 07/29/14  . GERD (gastroesophageal reflux disease)   . Hyperlipemia 07/29/14  . Hypertension   . Pneumonia   . Spinal stenosis 07/29/14    Patient Active Problem List   Diagnosis Date Noted  . HCAP (healthcare-associated pneumonia) 12/18/2017  . Bipolar 1 disorder (HCC) 05/29/2017  . CHF (congestive heart failure) (HCC) 04/23/2017  . Accelerated hypertension 04/21/2017  . Severe sepsis (HCC) 10/03/2015  . Hypernatremia 10/03/2015  . Lactic acidosis 10/03/2015  . Leukocytosis 10/03/2015  . Agitation 10/03/2015  . Severe recurrent major depression without psychotic features (HCC)   . Major depressive disorder, recurrent  episode, severe, with psychotic behavior (HCC) 10/13/2014  . Severe episode of recurrent major depressive disorder, with psychotic features (HCC) 10/13/2014  . 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 Essential hypertension 09/17/2014  . Chronic back pain 09/17/2014  . Gastric reflux 09/17/2014  . History of stroke 09/17/2014  . Severe recurrent major depression with psychotic features (HCC)   . Hallux abductovalgus with bunions 09/12/2013  . Hammer toe 09/12/2013  . Foot pain 09/12/2013  . Fungal infection of nail 09/12/2013  . Carpal tunnel syndrome 12/21/2010  . Chronic LBP 12/21/2010  . Degenerative arthritis of lumbar spine 12/21/2010  . Clinical depression 12/21/2010  . 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 HLD (hyperlipidemia) 12/21/2010  . Acid reflux 12/21/2010  . Spinal stenosis 12/21/2010    Past Surgical History:  Procedure Laterality Date  . HIP SURGERY Right   . INNER EAR SURGERY Right   . REPLACEMENT TOTAL KNEE BILATERAL Bilateral 07/27/14    Prior to Admission medications   Medication Sig Start Date End Date Taking? Authorizing Provider  acetaminophen-codeine (TYLENOL #3) 300-30 MG per tablet Take 1 tablet by mouth every 8 (eight) hours as needed for moderate pain or severe pain.    [provider]  benzonatate (TESSALON) 200 MG capsule Take 1 capsule (200 mg total) by mouth 3 (three) times daily as needed for cough. 12/25/17   Mayo, Allyn Kenner, MD  cholecalciferol (VITAMIN D) 1000 units tablet Take 1,000 Units by mouth daily.    [provider]  ferrous sulfate 325 (65 FE) MG EC tablet Take 325 mg by mouth daily with breakfast.    [provider]  furosemide (LASIX) 20 MG tablet TAKE 1 TABLET BY MOUTH ONCE DAILY 03/07/18   Iran Ouch, MD  losartan (COZAAR) 25 MG tablet Take 1 tablet (25 mg total) by mouth daily. 09/26/14   Pucilowska, Jolanta B, MD  magnesium oxide (MAG-OX) 400 MG  tablet Take 400 mg by mouth daily.    [provider]  metoprolol tartrate (LOPRESSOR) 50 MG tablet Take 50 mg by mouth 2 (two) times daily.    [provider]  mirtazapine (REMERON) 45 MG tablet Take 1 tablet (45 mg total) by mouth at bedtime. 10/03/17   Clapacs, Jackquline Denmark, MD  OLANZapine (ZYPREXA) 20 MG tablet TAKE 1 TABLET BY MOUTH AT BEDTIME 04/10/18   Clapacs, Jackquline Denmark, MD  omeprazole (PRILOSEC) 20 MG capsule Take 20 mg by mouth 2 (two) times daily before a meal.     [provider]  simvastatin (ZOCOR) 40 MG tablet Take 40 mg by mouth at bedtime.     [provider]  venlafaxine XR (EFFEXOR-XR) 150 MG 24 hr capsule TAKE 1 CAPSULE BY MOUTH DAILY WITH BREAKFAST 10/03/17   Clapacs, Jackquline Denmark, MD    Allergies Aspirin and Sulfa antibiotics  Family History  Problem Relation Age of Onset  . Hypertension Mother   . Stroke Mother     Social History Social History   Tobacco Use  . Smoking status: Former Smoker    Types: Cigarettes    Last attempt to quit: 07/29/1975    Years since quitting: 42.7  . Smokeless tobacco: Never Used  Substance Use Topics  . Alcohol use: No    Alcohol/week: 0.0 standard drinks  . Drug use: No    Review of Systems Constitutional: Negative for fever. Eyes: Negative for visual changes. ENT: + facial injury. No neck injury Cardiovascular: Negative for chest injury. Respiratory: Negative for shortness of breath. Negative for chest wall injury. Gastrointestinal: Negative for abdominal pain or injury. Genitourinary: Negative for dysuria. Musculoskeletal: Negative for back injury, + R hip pain Skin: + lip laceration Neurological: + head injury.   ____________________________________________   PHYSICAL EXAM:  VITAL SIGNS: ED Triage Vitals  Enc Vitals Group     BP 04/23/18 1540 (!) 160/84     Pulse Rate 04/23/18 1540 94     Resp 04/23/18 1540 16     Temp 04/23/18 1540 98.2 F (36.8 C)     Temp src --      SpO2 04/23/18  1540 100 %     Weight 04/23/18 1541 154 lb (69.9 kg)     Height 04/23/18 1541 5' (1.524 m)     Head Circumference --      Peak Flow --      Pain Score 04/23/18 1541 4     Pain Loc --      Pain Edu? --      Excl. in GC? --     Constitutional: Alert and oriented. No acute distress. Does not appear intoxicated. HEENT Head: Normocephalic and atraumatic. Face: No facial bony tenderness. Stable midface Ears: No hemotympanum bilaterally. No Battle sign Eyes: No eye injury. PERRL. No raccoon eyes Nose: tender to palpation. No active epistaxis but patient has dry blood in both nares. No rhinorrhea Mouth/Throat: Mucous membranes are moist. No oropharyngeal blood. No dental injury. Airway patent without stridor. Normal voice. Lip laceration Neck: no  C-collar in place. No midline c-spine tenderness.  Cardiovascular: Normal rate, regular rhythm. Normal and symmetric distal pulses are present in all extremities. Pulmonary/Chest: Chest wall is stable and nontender to palpation/compression. Normal respiratory effort. Breath sounds are normal. No crepitus.  Abdominal: Soft, nontender, non distended. Musculoskeletal: Nontender with normal full range of motion in all extremities. No deformities. No thoracic or lumbar midline spinal tenderness. Pelvis is stable. Skin: Skin is warm, dry and intact. No abrasions or contutions. Psychiatric: Speech and behavior are appropriate. Neurological: Normal speech and language. Moves all extremities to command. No gross focal neurologic deficits are appreciated.  Glascow Coma Score: 4 - Opens eyes on own 6 - Follows simple motor commands 5 - Alert and oriented GCS: 15   ____________________________________________   LABS (all labs ordered are listed, but only abnormal results are displayed)  Labs Reviewed - No data to display ____________________________________________  EKG  none  ____________________________________________  RADIOLOGY  I have  personally reviewed the images performed during this visit and I agree with the Radiologist's read.   Interpretation by Radiologist:  Dg Chest 1 View  Result Date: 04/23/2018 CLINICAL DATA:  Fall.  History of dementia and hypertension. EXAM: CHEST  1 VIEW COMPARISON:  12/20/2017 and older exams. FINDINGS: Cardiac silhouette is borderline enlarged. No mediastinal or hilar masses. Elevated right hemidiaphragm similar to prior studies. Are prominent bronchovascular markings.  Lungs otherwise clear. No pleural effusion or pneumothorax. No fracture or acute skeletal abnormality IMPRESSION: No acute cardiopulmonary disease. Electronically Signed   By: Amie Portland M.D.   On: 04/23/2018 16:51   Ct Head Wo Contrast  Result Date: 04/23/2018 CLINICAL DATA:  Pt arrived via [facility] from home with mechanical fall. No LOC or blood thinners. Pt has laceration to nose and upper lip. EXAM: CT HEAD WITHOUT CONTRAST CT MAXILLOFACIAL WITHOUT CONTRAST CT CERVICAL SPINE WITHOUT CONTRAST TECHNIQUE: Multidetector CT imaging of the head, cervical spine, and maxillofacial structures were performed using the standard protocol without intravenous contrast. Multiplanar CT image reconstructions of the cervical spine and maxillofacial structures were also generated. COMPARISON:  02/10/2017 FINDINGS: CT HEAD FINDINGS Brain: No evidence of acute infarction, hemorrhage, hydrocephalus, extra-axial collection or mass lesion/mass effect. Patchy areas of white matter hypoattenuation noted consistent with mild chronic microvascular ischemic change. Vascular: No hyperdense vessel or unexpected calcification. Skull: Normal. Negative for fracture or focal lesion. Other: None. CT MAXILLOFACIAL FINDINGS Osseous: Subtle nondisplaced fracture along left anterior maxillary alveolar ridge. Patient is edentulous. No other fractures.  No bone lesions. Orbits: Negative. No traumatic or inflammatory finding. Sinuses: Sinuses are clear.  Status post right  mastoidectomy. Soft tissues: Soft tissue swelling of the upper lip. No other soft tissue abnormality. No mass or adenopathy. CT CERVICAL SPINE FINDINGS Alignment: Straightening of the normal cervical lordosis. No spondylolisthesis. Skull base and vertebrae: No acute fracture. No primary bone lesion or focal pathologic process. Soft tissues and spinal canal: No prevertebral fluid or swelling. No visible canal hematoma. No soft tissue masses or adenopathy. Disc levels: Mild loss of disc height C2-C3 through C5-C6 with moderate loss of disc height at C6-C7. Facet degenerative changes are noted greatest on the right at C4-C5. No convincing disc herniation. Upper chest: No acute findings.  Clear lung apices. Other: None. IMPRESSION: HEAD CT 1. No acute intracranial abnormalities. 2. No skull fracture. MAXILLOFACIAL CT 1. Subtle nondisplaced fracture of the aveolar ridge, left anterior maxilla. No other fractures. There is associated soft tissue swelling. CERVICAL CT 1. No fracture or acute finding. Electronically  Signed   By: Amie Portland M.D.   On: 04/23/2018 16:46   Ct Cervical Spine Wo Contrast  Result Date: 04/23/2018 CLINICAL DATA:  Pt arrived via [facility] from home with mechanical fall. No LOC or blood thinners. Pt has laceration to nose and upper lip. EXAM: CT HEAD WITHOUT CONTRAST CT MAXILLOFACIAL WITHOUT CONTRAST CT CERVICAL SPINE WITHOUT CONTRAST TECHNIQUE: Multidetector CT imaging of the head, cervical spine, and maxillofacial structures were performed using the standard protocol without intravenous contrast. Multiplanar CT image reconstructions of the cervical spine and maxillofacial structures were also generated. COMPARISON:  02/10/2017 FINDINGS: CT HEAD FINDINGS Brain: No evidence of acute infarction, hemorrhage, hydrocephalus, extra-axial collection or mass lesion/mass effect. Patchy areas of white matter hypoattenuation noted consistent with mild chronic microvascular ischemic change. Vascular: No  hyperdense vessel or unexpected calcification. Skull: Normal. Negative for fracture or focal lesion. Other: None. CT MAXILLOFACIAL FINDINGS Osseous: Subtle nondisplaced fracture along left anterior maxillary alveolar ridge. Patient is edentulous. No other fractures.  No bone lesions. Orbits: Negative. No traumatic or inflammatory finding. Sinuses: Sinuses are clear.  Status post right mastoidectomy. Soft tissues: Soft tissue swelling of the upper lip. No other soft tissue abnormality. No mass or adenopathy. CT CERVICAL SPINE FINDINGS Alignment: Straightening of the normal cervical lordosis. No spondylolisthesis. Skull base and vertebrae: No acute fracture. No primary bone lesion or focal pathologic process. Soft tissues and spinal canal: No prevertebral fluid or swelling. No visible canal hematoma. No soft tissue masses or adenopathy. Disc levels: Mild loss of disc height C2-C3 through C5-C6 with moderate loss of disc height at C6-C7. Facet degenerative changes are noted greatest on the right at C4-C5. No convincing disc herniation. Upper chest: No acute findings.  Clear lung apices. Other: None. IMPRESSION: HEAD CT 1. No acute intracranial abnormalities. 2. No skull fracture. MAXILLOFACIAL CT 1. Subtle nondisplaced fracture of the aveolar ridge, left anterior maxilla. No other fractures. There is associated soft tissue swelling. CERVICAL CT 1. No fracture or acute finding. Electronically Signed   By: Amie Portland M.D.   On: 04/23/2018 16:46   Dg Hip Unilat W Or Wo Pelvis 2-3 Views Right  Result Date: 04/23/2018 CLINICAL DATA:  Fall.  RIGHT hip pain. EXAM: DG HIP (WITH OR WITHOUT PELVIS) 2-3V RIGHT COMPARISON:  None. FINDINGS: No acute fracture deformity or dislocation. Moderate RIGHT greater LEFT hip joint space narrowing with RIGHT hip superolateral acetabular and RIGHT femoral head neck junction spurring. No destructive bony lesions. Surgical suture material projects RIGHT lower quadrant. Moderate vascular  calcifications. IMPRESSION: 1. No fracture deformity or dislocation. 2. Moderate RIGHT hip osteoarthrosis. 3.  Aortic Atherosclerosis (ICD10-I70.0). Electronically Signed   By: Awilda Metro M.D.   On: 04/23/2018 16:46   Ct Maxillofacial Wo Contrast  Result Date: 04/23/2018 CLINICAL DATA:  Pt arrived via [facility] from home with mechanical fall. No LOC or blood thinners. Pt has laceration to nose and upper lip. EXAM: CT HEAD WITHOUT CONTRAST CT MAXILLOFACIAL WITHOUT CONTRAST CT CERVICAL SPINE WITHOUT CONTRAST TECHNIQUE: Multidetector CT imaging of the head, cervical spine, and maxillofacial structures were performed using the standard protocol without intravenous contrast. Multiplanar CT image reconstructions of the cervical spine and maxillofacial structures were also generated. COMPARISON:  02/10/2017 FINDINGS: CT HEAD FINDINGS Brain: No evidence of acute infarction, hemorrhage, hydrocephalus, extra-axial collection or mass lesion/mass effect. Patchy areas of white matter hypoattenuation noted consistent with mild chronic microvascular ischemic change. Vascular: No hyperdense vessel or unexpected calcification. Skull: Normal. Negative for fracture or focal lesion. Other:  None. CT MAXILLOFACIAL FINDINGS Osseous: Subtle nondisplaced fracture along left anterior maxillary alveolar ridge. Patient is edentulous. No other fractures.  No bone lesions. Orbits: Negative. No traumatic or inflammatory finding. Sinuses: Sinuses are clear.  Status post right mastoidectomy. Soft tissues: Soft tissue swelling of the upper lip. No other soft tissue abnormality. No mass or adenopathy. CT CERVICAL SPINE FINDINGS Alignment: Straightening of the normal cervical lordosis. No spondylolisthesis. Skull base and vertebrae: No acute fracture. No primary bone lesion or focal pathologic process. Soft tissues and spinal canal: No prevertebral fluid or swelling. No visible canal hematoma. No soft tissue masses or adenopathy. Disc levels:  Mild loss of disc height C2-C3 through C5-C6 with moderate loss of disc height at C6-C7. Facet degenerative changes are noted greatest on the right at C4-C5. No convincing disc herniation. Upper chest: No acute findings.  Clear lung apices. Other: None. IMPRESSION: HEAD CT 1. No acute intracranial abnormalities. 2. No skull fracture. MAXILLOFACIAL CT 1. Subtle nondisplaced fracture of the aveolar ridge, left anterior maxilla. No other fractures. There is associated soft tissue swelling. CERVICAL CT 1. No fracture or acute finding. Electronically Signed   By: Amie Portland M.D.   On: 04/23/2018 16:46     ____________________________________________   PROCEDURES  Procedure(s) performed:yes .Marland KitchenLaceration Repair Date/Time: 04/23/2018 6:40 PM Performed by: Nita Sickle, MD Authorized by: Nita Sickle, MD   Consent:    Consent obtained:  Verbal   Consent given by:  Patient   Risks discussed:  Infection, pain, retained foreign body, poor cosmetic result and poor wound healing Anesthesia (see MAR for exact dosages):    Anesthesia method:  Local infiltration and topical application   Topical anesthetic:  LET Laceration details:    Location:  Lip   Lip location:  Upper exterior lip   Length (cm):  2 Repair type:    Repair type:  Simple Exploration:    Hemostasis achieved with:  Direct pressure and LET   Wound exploration: entire depth of wound probed and visualized     Contaminated: no   Treatment:    Area cleansed with:  Saline   Amount of cleaning:  Extensive   Irrigation solution:  Sterile saline   Visualized foreign bodies/material removed: no   Skin repair:    Repair method:  Sutures   Suture size:  6-0   Suture material:  Nylon   Suture technique:  Simple interrupted   Number of sutures:  3 Approximation:    Approximation:  Close Post-procedure details:    Dressing:  Open (no dressing)   Patient tolerance of procedure:  Tolerated well, no immediate  complications   Critical Care performed:  None ____________________________________________   INITIAL IMPRESSION / ASSESSMENT AND PLAN / ED COURSE   83 y.o. female with history as listed below who presents for evaluation of mechanical fall with face trauma and lip laceration.  Patient sustained a lip laceration which was repaired per procedure note above.  No signs or symptoms of basilar skull fracture.  CT head and cervical spine with no acute findings.  CT face is showing a subtle nondisplaced fracture of the alveolar ridge of the left maxilla. Tetanus up to date. Patient was dc home on supportive care and f/u with PCP      As part of my medical decision making, I reviewed the following data within the electronic MEDICAL RECORD NUMBER Nursing notes reviewed and incorporated, Old chart reviewed, Radiograph reviewed , Notes from prior ED visits and [place] Controlled Substance Database  Pertinent labs & imaging results that were available during my care of the patient were reviewed by me and considered in my medical decision making (see chart for details).    ____________________________________________   FINAL CLINICAL IMPRESSION(S) / ED DIAGNOSES  Final diagnoses:  Fall, initial encounter  Lip laceration, initial encounter  Closed fracture of alveolar process of maxilla, initial encounter (HCC)      NEW MEDICATIONS STARTED DURING THIS VISIT:  ED Discharge Orders    None       Note:  This document was prepared using Dragon voice recognition software and may include unintentional dictation errors.    Don PerkingVeronese, [place], MD 04/23/18 [phone]

## 2018-04-23 NOTE — Discharge Instructions (Addendum)
Keep laceration dry and clean. Wash with warm water and soap. Apply topical bacitracin. Protect from the sun to minimize scarring. Cover it with SPF 70 or higher and use hat when out in the sun for 6-9 months. You received 3 stitches that will fall off with no need for removal.  Watch for signs of infection: pus, redness of the skin surrounding it, or fever. If these develop see your doctor or return to the ER for antibiotics.

## 2018-05-09 ENCOUNTER — Other Ambulatory Visit: Payer: Self-pay | Admitting: Cardiovascular Disease

## 2018-05-20 ENCOUNTER — Telehealth: Payer: Self-pay

## 2018-05-20 MED ORDER — METOPROLOL TARTRATE 50 MG PO TABS: 50.0000 mg | ORAL_TABLET | Freq: Two times a day (BID) | ORAL | 1 refills | Status: DC

## 2018-05-20 NOTE — Telephone Encounter (Signed)
RX for metoprolol Tartrate 50 mg twice a day sent to [facility].

## 2018-05-22 ENCOUNTER — Telehealth: Payer: Self-pay

## 2018-05-23 ENCOUNTER — Other Ambulatory Visit: Payer: Self-pay | Admitting: Psychiatry

## 2018-05-24 ENCOUNTER — Encounter: Payer: Self-pay | Admitting: Anesthesiology

## 2018-05-24 ENCOUNTER — Encounter
Admission: RE | Admit: 2018-05-24 | Discharge: 2018-05-24 | Disposition: A | Discharge status: Home / Self Care | Payer: Medicare Other | Source: Ambulatory Visit | Attending: Psychiatry | Admitting: Psychiatry

## 2018-05-24 DIAGNOSIS — I5032 Chronic diastolic (congestive) heart failure: Secondary | ICD-10-CM | POA: Diagnosis not present

## 2018-05-24 DIAGNOSIS — I110 Hypertensive heart disease with heart failure: Secondary | ICD-10-CM | POA: Diagnosis not present

## 2018-05-24 DIAGNOSIS — F0390 Unspecified dementia without behavioral disturbance: Secondary | ICD-10-CM | POA: Insufficient documentation

## 2018-05-24 DIAGNOSIS — G8929 Other chronic pain: Secondary | ICD-10-CM | POA: Diagnosis not present

## 2018-05-24 DIAGNOSIS — F319 Bipolar disorder, unspecified: Secondary | ICD-10-CM | POA: Diagnosis not present

## 2018-05-24 DIAGNOSIS — K219 Gastro-esophageal reflux disease without esophagitis: Secondary | ICD-10-CM | POA: Insufficient documentation

## 2018-05-24 DIAGNOSIS — F333 Major depressive disorder, recurrent, severe with psychotic symptoms: Secondary | ICD-10-CM

## 2018-05-24 DIAGNOSIS — Z87891 Personal history of nicotine dependence: Secondary | ICD-10-CM | POA: Insufficient documentation

## 2018-05-24 MED ORDER — LABETALOL HCL 5 MG/ML IV SOLN
INTRAVENOUS | Status: DC | PRN
  Administered 2018-05-24: 20 mg via INTRAVENOUS

## 2018-05-24 MED ORDER — SUCCINYLCHOLINE CHLORIDE 200 MG/10ML IV SOSY
PREFILLED_SYRINGE | INTRAVENOUS | Status: DC | PRN
  Administered 2018-05-24: 80 mg via INTRAVENOUS

## 2018-05-24 MED ORDER — SODIUM CHLORIDE 0.9 % IV SOLN: 500.0000 mL | Freq: Once | INTRAVENOUS | Status: DC

## 2018-05-24 MED ORDER — METHOHEXITAL SODIUM 100 MG/10ML IV SOSY
PREFILLED_SYRINGE | INTRAVENOUS | Status: DC | PRN
  Administered 2018-05-24: 60 mg via INTRAVENOUS

## 2018-05-24 NOTE — Procedures (Signed)
ECT SERVICES Physician's Interval Evaluation & Treatment Note  Patient Identification: Jenna Dudley MRN:  [id number] Date of Evaluation:  05/24/2018 TX #: [id number]  MADRS:   MMSE:   P.E. Findings:  No real change to physical exam  Psychiatric Interval Note:  She is having these guilty paranoid thoughts that are usually a bad sign that she could be getting depressed.  Not grossly psychotic.  Cooperative.  Subjective:  Patient is a 83 y.o. female seen for evaluation for Electroconvulsive Therapy. Describing herself as being "afraid" and some unclear way  Treatment Summary:   [x]   Right Unilateral             []  Bilateral   % Energy : 0.3 ms 90%   Impedance: 2070 ohms  Seizure Energy Index: 5930 V squared  Postictal Suppression Index: 35% but I do not think that is probably accurate  Seizure Concordance Index: 75%  Medications  Pre Shock: Labetalol 20 mg Brevital 60 mg succinylcholine 80 mg Post Shock:    Seizure Duration: 22 seconds EMG 45 seconds EEG   Comments: Follow-up in 3 weeks  Lungs:  [x]   Clear to auscultation               []  Other:   Heart:    [x]   Regular rhythm             []  irregular rhythm    [x]   Previous H&P reviewed, patient examined and there are NO CHANGES                 []   Previous H&P reviewed, patient examined and there are changes noted.   Mordecai Rasmussen, MD 2/28/202011:29 AM

## 2018-05-24 NOTE — Anesthesia Postprocedure Evaluation (Signed)
Anesthesia Post Note  Patient: Jenna Dudley  Procedure(s) Performed: ECT TX  Patient location during evaluation: PACU Anesthesia Type: General Level of consciousness: awake and alert Pain management: pain level controlled Vital Signs Assessment: post-procedure vital signs reviewed and stable Respiratory status: spontaneous breathing, nonlabored ventilation and respiratory function stable Cardiovascular status: blood pressure returned to baseline and stable Postop Assessment: no signs of nausea or vomiting Anesthetic complications: no     Last Vitals:  Vitals:   05/24/18 1145 05/24/18 1155  BP: (!) 131/53 (!) 126/52  Pulse: 81 80  Resp: 20 18  Temp: (!) 36.1 C   SpO2: 100% 95%    Last Pain:  Vitals:   05/24/18 1145  TempSrc:   PainSc: 0-No pain                 [name]

## 2018-05-24 NOTE — Anesthesia Preprocedure Evaluation (Signed)
Anesthesia Evaluation  Patient identified by MRN, date of birth, ID band Patient awake    Reviewed: Allergy & Precautions, NPO status , Patient's Chart, lab work & pertinent test results  History of Anesthesia Complications Negative for: history of anesthetic complications  Airway Mallampati: II  TM Distance: >3 FB Neck ROM: Full    Dental  (+) Edentulous Upper, Poor Dentition   Pulmonary neg sleep apnea, neg COPD, former smoker,    breath sounds clear to auscultation- rhonchi (-) wheezing      Cardiovascular hypertension, Pt. on medications +CHF  (-) CAD, (-) Past MI, (-) Cardiac Stents and (-) CABG  Rhythm:Regular Rate:Normal - Systolic murmurs and - Diastolic murmurs Echo 04/22/17: - Left ventricle: The cavity size was normal. There was mild   concentric hypertrophy. Systolic function was normal. The   estimated ejection fraction was in the range of 55% to 60%. Wall   motion was normal; there were no regional wall motion   abnormalities. Doppler parameters are consistent with abnormal   left ventricular relaxation (grade 1 diastolic dysfunction). - Mitral valve: Calcified annulus. There was mild regurgitation.   Neuro/Psych PSYCHIATRIC DISORDERS Depression Bipolar Disorder Dementia negative neurological ROS     GI/Hepatic Neg liver ROS, GERD  ,  Endo/Other  negative endocrine ROSneg diabetes  Renal/GU negative Renal ROS     Musculoskeletal  (+) Arthritis ,   Abdominal (+) - obese,   Peds  Hematology negative hematology ROS (+)   Anesthesia Other Findings Past Medical History: 07/27/14: Bilateral carpal tunnel syndrome No date: Bipolar disorder (HCC)     Comment:  geropsych admission to [place] in Dec 2018 No date: Chronic diastolic CHF (congestive heart failure) (HCC)     Comment:  a. 03/2017 Echo: EF 55-60%, no rwma, Gr1 DD, mild MR. 07/29/14: Chronic low back pain 07/27/14: Degenerative arthritis of lumbar  spine No date: Dementia (HCC) No date: Depression 07/29/14: Esophageal spasm No date: GERD (gastroesophageal reflux disease) 07/29/14: Hyperlipemia No date: Hypertension No date: Pneumonia 07/29/14: Spinal stenosis   Reproductive/Obstetrics                             Anesthesia Physical  Anesthesia Plan  ASA: III  Anesthesia Plan: General   Post-op Pain Management:    Induction: Intravenous  PONV Risk Score and Plan: 2 and Ondansetron  Airway Management Planned: Mask  Additional Equipment:   Intra-op Plan:   Post-operative Plan:   Informed Consent: I have reviewed the patients History and Physical, chart, labs and discussed the procedure including the risks, benefits and alternatives for the proposed anesthesia with the patient or authorized representative who has indicated his/her understanding and acceptance.     Dental advisory given  Plan Discussed with: CRNA and Anesthesiologist  Anesthesia Plan Comments:         Anesthesia Quick Evaluation  

## 2018-05-24 NOTE — Discharge Instructions (Signed)
1)  The drugs that you have been given will stay in your system until tomorrow so for the       next 24 hours you should not: ° A. Drive an automobile ° B. Make any legal decisions ° C. Drink any alcoholic beverages ° °2)  You may resume your regular meals upon return home. ° °3)  A responsible adult must take you home.  Someone should stay with you for a few          hours, then be available by phone for the remainder of the treatment day. ° °4)  You May experience any of the following symptoms: ° Headache, Nausea and a dry mouth (due to the medications you were given),  temporary memory loss and some confusion, or sore muscles (a warm bath  should help this).  If you you experience any of these symptoms let us know on                your return visit. ° °5)  Report any of the following: any acute discomfort, severe headache, or temperature        greater than 100.5 F.   Also report any unusual redness, swelling, drainage, or pain         at your IV site. ° °  You may report Symptoms to:  ECT PROGRAM- [place] [facility] °         Phone: [phone], ECT Department  °         or Dr. Clapac's office [phone] ° °6)  Your next ECT Treatment is Friday March 20  ° We will call 2 days prior to your scheduled appointment for arrival times. ° °7)  Nothing to eat or drink after midnight the night before your procedure. ° °8)  Take    With a sip of water the morning of your procedure. ° °9)  Other Instructions: Call [phone] to cancel the morning of your procedure due         to illness or emergency. ° °10) We will call within 72 hours to assess how you are feeling.  °

## 2018-05-24 NOTE — H&P (Signed)
Jenna Dudley is an 83 y.o. female.   Chief Complaint: Patient is having some anxiety of the nearly paranoid type worrying about her husband.  This is usually a bad sign. HPI: History of recurrent episodes of psychotic depression  Past Medical History:  Diagnosis Date  . Bilateral carpal tunnel syndrome 07/27/14  . Bipolar disorder [facility])    geropsych admission to [facility] in Dec 2018  . Chronic diastolic CHF (congestive heart failure) (HCC)    a. 03/2017 Echo: EF 55-60%, no rwma, Gr1 DD, mild MR.  . Chronic low back pain 07/29/14  . Degenerative arthritis of lumbar spine 07/27/14  . Dementia (HCC)   . Depression   . Esophageal spasm 07/29/14  . GERD (gastroesophageal reflux disease)   . Hyperlipemia 07/29/14  . Hypertension   . Pneumonia   . Spinal stenosis 07/29/14    Past Surgical History:  Procedure Laterality Date  . HIP SURGERY Right   . INNER EAR SURGERY Right   . REPLACEMENT TOTAL KNEE BILATERAL Bilateral 07/27/14    Family History  Problem Relation Age of Onset  . Hypertension Mother   . Stroke Mother    Social History:  reports that she quit smoking about 42 years ago. Her smoking use included cigarettes. She has never used smokeless tobacco. She reports that she does not drink alcohol or use drugs.  Allergies:  Allergies  Allergen Reactions  . Aspirin Other (See Comments)    Unknown reaction  . Sulfa Antibiotics Other (See Comments)    Unknown reaction    (Not in a hospital admission)   No results found for this or any previous visit (from the past 48 hour(s)). No results found.  Review of Systems  Constitutional: Negative.   HENT: Negative.   Eyes: Negative.   Respiratory: Negative.   Cardiovascular: Negative.   Gastrointestinal: Negative.   Musculoskeletal: Negative.   Skin: Negative.   Neurological: Negative.   Psychiatric/Behavioral: The patient is nervous/anxious.     Blood pressure (!) 128/43, pulse 89, temperature 98 F (36.7 C), temperature  source Oral, resp. rate 18, height 5\' 2"  (1.575 m), weight 69.4 kg, SpO2 100 %. Physical Exam  Nursing note and vitals reviewed. Constitutional: She appears well-developed and well-nourished.  HENT:  Head: Normocephalic and atraumatic.  Eyes: Pupils are equal, round, and reactive to light. Conjunctivae are normal.  Neck: Normal range of motion.  Cardiovascular: Normal heart sounds.  Respiratory: Effort normal.  GI: Soft.  Musculoskeletal: Normal range of motion.  Neurological: She is alert.  Skin: Skin is warm and dry.  Psychiatric: Her speech is normal and behavior is normal. Judgment normal. Her mood appears anxious. Thought content is paranoid. Cognition and memory are normal. She expresses no homicidal and no suicidal ideation.     Assessment/Plan Just a little bit of a sign today that she might be slipping.  When she starts to get nervous and frightened that can presage a bad episode.  I will see her back in 3 weeks instead of waiting the probably excessive 6 weeks we did this time.  Mordecai Rasmussen, MD 05/24/2018, 11:27 AM

## 2018-05-24 NOTE — Anesthesia Procedure Notes (Signed)
Date/Time: 05/24/2018 11:29 AM Performed by: Junious Silk, CRNA Pre-anesthesia Checklist: Patient identified, Emergency Drugs available, Suction available, Patient being monitored and Timeout performed Oxygen Delivery Method: Simple face mask and Ambu bag Ventilation: Mask ventilation throughout procedure

## 2018-05-24 NOTE — Transfer of Care (Signed)
Immediate Anesthesia Transfer of Care Note  Patient: Jenna Dudley  Procedure(s) Performed: ECT TX  Patient Location: PACU  Anesthesia Type:General  Level of Consciousness: awake and sedated  Airway & Oxygen Therapy: Patient Spontanous Breathing and Patient connected to face mask oxygen  Post-op Assessment: Report given to RN and Post -op Vital signs reviewed and stable  Post vital signs: Reviewed and stable  Last Vitals:  Vitals Value Taken Time  BP 131/53 05/24/2018 11:45 AM  Temp    Pulse 81 05/24/2018 11:46 AM  Resp 20 05/24/2018 11:46 AM  SpO2 100 % 05/24/2018 11:46 AM  Vitals shown include unvalidated device data.  Last Pain:  Vitals:   05/24/18 0840  TempSrc: Oral  PainSc: 4          Complications: No apparent anesthesia complications

## 2018-05-24 NOTE — Anesthesia Post-op Follow-up Note (Signed)
Anesthesia QCDR form completed.        

## 2018-05-30 ENCOUNTER — Other Ambulatory Visit: Payer: Self-pay | Admitting: Psychiatry

## 2018-05-30 DIAGNOSIS — F3160 Bipolar disorder, current episode mixed, unspecified: Secondary | ICD-10-CM

## 2018-06-10 ENCOUNTER — Other Ambulatory Visit: Payer: Self-pay | Admitting: Psychiatry

## 2018-06-10 ENCOUNTER — Other Ambulatory Visit: Payer: Self-pay | Admitting: Internal Medicine

## 2018-06-12 ENCOUNTER — Telehealth: Payer: Self-pay

## 2018-06-13 ENCOUNTER — Other Ambulatory Visit: Payer: Self-pay | Admitting: Psychiatry

## 2018-06-13 DIAGNOSIS — F3160 Bipolar disorder, current episode mixed, unspecified: Secondary | ICD-10-CM

## 2018-06-13 MED ORDER — VENLAFAXINE HCL ER 150 MG PO CP24: ORAL_CAPSULE | ORAL | 6 refills | Status: DC

## 2018-06-13 MED ORDER — OLANZAPINE 20 MG PO TABS: 20.0000 mg | ORAL_TABLET | Freq: Every day | ORAL | 1 refills | Status: DC

## 2018-06-13 MED ORDER — MIRTAZAPINE 45 MG PO TABS: 45.0000 mg | ORAL_TABLET | Freq: Every day | ORAL | 6 refills | Status: DC

## 2018-06-14 ENCOUNTER — Encounter
Admission: RE | Admit: 2018-06-14 | Discharge: 2018-06-14 | Disposition: A | Discharge status: Home / Self Care | Payer: Medicare Other | Source: Ambulatory Visit | Attending: Psychiatry | Admitting: Psychiatry

## 2018-06-14 ENCOUNTER — Encounter: Payer: Self-pay | Admitting: Anesthesiology

## 2018-06-14 ENCOUNTER — Other Ambulatory Visit: Payer: Self-pay

## 2018-06-14 DIAGNOSIS — K219 Gastro-esophageal reflux disease without esophagitis: Secondary | ICD-10-CM | POA: Insufficient documentation

## 2018-06-14 DIAGNOSIS — I5032 Chronic diastolic (congestive) heart failure: Secondary | ICD-10-CM | POA: Insufficient documentation

## 2018-06-14 DIAGNOSIS — Z87891 Personal history of nicotine dependence: Secondary | ICD-10-CM | POA: Insufficient documentation

## 2018-06-14 DIAGNOSIS — F0390 Unspecified dementia without behavioral disturbance: Secondary | ICD-10-CM | POA: Diagnosis not present

## 2018-06-14 DIAGNOSIS — F319 Bipolar disorder, unspecified: Secondary | ICD-10-CM | POA: Diagnosis not present

## 2018-06-14 DIAGNOSIS — I110 Hypertensive heart disease with heart failure: Secondary | ICD-10-CM | POA: Diagnosis not present

## 2018-06-14 DIAGNOSIS — G8929 Other chronic pain: Secondary | ICD-10-CM | POA: Insufficient documentation

## 2018-06-14 DIAGNOSIS — F332 Major depressive disorder, 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 features: Secondary | ICD-10-CM | POA: Diagnosis not present

## 2018-06-14 MED ORDER — IPRATROPIUM-ALBUTEROL 0.5-2.5 (3) MG/3ML IN SOLN
3.0000 mL | Freq: Once | RESPIRATORY_TRACT | Status: AC
  Administered 2018-06-14: 3 mL via RESPIRATORY_TRACT

## 2018-06-14 MED ORDER — IPRATROPIUM-ALBUTEROL 0.5-2.5 (3) MG/3ML IN SOLN
RESPIRATORY_TRACT | Status: AC
  Filled 2018-06-14: qty 3

## 2018-06-14 MED ORDER — SUCCINYLCHOLINE CHLORIDE 20 MG/ML IJ SOLN
INTRAMUSCULAR | Status: DC | PRN
  Administered 2018-06-14: 80 mg via INTRAVENOUS

## 2018-06-14 MED ORDER — SUCCINYLCHOLINE CHLORIDE 20 MG/ML IJ SOLN
INTRAMUSCULAR | Status: AC
  Filled 2018-06-14: qty 1

## 2018-06-14 MED ORDER — METHOHEXITAL SODIUM 0.5 G IJ SOLR
INTRAMUSCULAR | Status: AC
  Filled 2018-06-14: qty 500

## 2018-06-14 MED ORDER — LABETALOL HCL 5 MG/ML IV SOLN
INTRAVENOUS | Status: DC | PRN
  Administered 2018-06-14: 20 mg via INTRAVENOUS

## 2018-06-14 MED ORDER — LABETALOL HCL 5 MG/ML IV SOLN
INTRAVENOUS | Status: AC
  Filled 2018-06-14: qty 8

## 2018-06-14 MED ORDER — PROPOFOL 10 MG/ML IV BOLUS
INTRAVENOUS | Status: AC
  Filled 2018-06-14: qty 20

## 2018-06-14 MED ORDER — SODIUM CHLORIDE 0.9 % IV SOLN
500.0000 mL | Freq: Once | INTRAVENOUS | Status: AC
  Administered 2018-06-14: 500 mL via INTRAVENOUS

## 2018-06-14 MED ORDER — METHOHEXITAL SODIUM 100 MG/10ML IV SOSY
PREFILLED_SYRINGE | INTRAVENOUS | Status: DC | PRN
  Administered 2018-06-14: 60 mg via INTRAVENOUS

## 2018-06-14 NOTE — Transfer of Care (Signed)
Immediate Anesthesia Transfer of Care Note  Patient: Jenna Dudley  Procedure(s) Performed: ECT TX  Patient Location: PACU  Anesthesia Type:General  Level of Consciousness: sedated  Airway & Oxygen Therapy: Patient Spontanous Breathing and Patient connected to face mask oxygen  Post-op Assessment: Report given to RN and Post -op Vital signs reviewed and stable  Post vital signs: Reviewed and stable  Last Vitals:  Vitals Value Taken Time  BP 138/47 06/14/2018 10:30 AM  Temp 36.1 C 06/14/2018 10:30 AM  Pulse 89 06/14/2018 10:30 AM  Resp 18 06/14/2018 10:30 AM  SpO2 100 % 06/14/2018 10:30 AM  Vitals shown include unvalidated device data.  Last Pain:  Vitals:   06/14/18 1030  TempSrc:   PainSc: 0-No pain         Complications: No apparent anesthesia complications

## 2018-06-14 NOTE — Anesthesia Preprocedure Evaluation (Addendum)
Anesthesia Evaluation  Patient identified by MRN, date of birth, ID band Patient awake    Reviewed: Allergy & Precautions, NPO status , Patient's Chart, lab work & pertinent test results  History of Anesthesia Complications Negative for: history of anesthetic complications  Airway Mallampati: II  TM Distance: >3 FB Neck ROM: Full    Dental  (+) Edentulous Upper, Poor Dentition   Pulmonary neg sleep apnea, neg COPD, former smoker,           Cardiovascular hypertension, Pt. on medications +CHF  (-) CAD, (-) Past MI, (-) Cardiac Stents and (-) CABG  - Systolic murmurs Echo 04/22/17: - Left ventricle: The cavity size was normal. There was mild   concentric hypertrophy. Systolic function was normal. The   estimated ejection fraction was in the range of 55% to 60%. Wall   motion was normal; there were no regional wall motion   abnormalities. Doppler parameters are consistent with abnormal   left ventricular relaxation (grade 1 diastolic dysfunction). - Mitral valve: Calcified annulus. There was mild regurgitation.   Neuro/Psych PSYCHIATRIC DISORDERS Depression Bipolar Disorder Dementia negative neurological ROS     GI/Hepatic Neg liver ROS, GERD  ,  Endo/Other  negative endocrine ROSneg diabetes  Renal/GU negative Renal ROS     Musculoskeletal  (+) Arthritis ,   Abdominal (+) - obese,   Peds  Hematology negative hematology ROS (+)   Anesthesia Other Findings Past Medical History: 07/27/14: Bilateral carpal tunnel syndrome No date: Bipolar disorder [facility])     Comment:  geropsych admission to [facility] in Dec 2018 No date: Chronic diastolic CHF (congestive heart failure) (HCC)     Comment:  a. 03/2017 Echo: EF 55-60%, no rwma, Gr1 DD, mild MR. 07/29/14: Chronic low back pain 07/27/14: Degenerative arthritis of lumbar spine No date: Dementia (HCC) No date: Depression 07/29/14: Esophageal spasm No date: GERD  (gastroesophageal reflux disease) 07/29/14: Hyperlipemia No date: Hypertension No date: Pneumonia 07/29/14: Spinal stenosis   Reproductive/Obstetrics                            Anesthesia Physical  Anesthesia Plan  ASA: III  Anesthesia Plan: General   Post-op Pain Management:    Induction: Intravenous  PONV Risk Score and Plan: 2 and Ondansetron  Airway Management Planned: Mask  Additional Equipment:   Intra-op Plan:   Post-operative Plan:   Informed Consent: I have reviewed the patients History and Physical, chart, labs and discussed the procedure including the risks, benefits and alternatives for the proposed anesthesia with the patient or authorized representative who has indicated his/her understanding and acceptance.     Dental advisory given  Plan Discussed with: CRNA and Anesthesiologist  Anesthesia Plan Comments:         Anesthesia Quick Evaluation

## 2018-06-14 NOTE — Procedures (Signed)
ECT SERVICES Physician's Interval Evaluation & Treatment Note  Patient Identification: KIMERA PAPAGEORGIOU MRN:  [id number] Date of Evaluation:  06/14/2018 TX #: 21  MADRS: 21  MMSE: 25  P.E. Findings:  No change to physical exam  Psychiatric Interval Note:  Mood feeling good upbeat mood good  Subjective:  Patient is a 83 y.o. female seen for evaluation for Electroconvulsive Therapy. Mood is stable  Treatment Summary:   [x]   Right Unilateral             []  Bilateral   % Energy : 0.3 ms 90%   Impedance: 1540 ohms  Seizure Energy Index: 11,152 V squared  Postictal Suppression Index: 54%  Seizure Concordance Index: 78%  Medications  Pre Shock: Labetalol 20 mg Brevital 60 mg succinylcholine 80 mg  Post Shock:    Seizure Duration: 19 seconds EMG 46 seconds EEG   Comments: Follow-up 4 weeks  Lungs:  [x]   Clear to auscultation               []  Other:   Heart:    [x]   Regular rhythm             []  irregular rhythm    [x]   Previous H&P reviewed, patient examined and there are NO CHANGES                 []   Previous H&P reviewed, patient examined and there are changes noted.   Mordecai Rasmussen, MD 3/20/202010:15 AM

## 2018-06-14 NOTE — Anesthesia Postprocedure Evaluation (Signed)
Anesthesia Post Note  Patient: Jenna Dudley  Procedure(s) Performed: ECT TX  Patient location during evaluation: PACU Anesthesia Type: General Level of consciousness: awake and alert Pain management: pain level controlled Vital Signs Assessment: post-procedure vital signs reviewed and stable Respiratory status: spontaneous breathing and respiratory function stable Cardiovascular status: blood pressure returned to baseline and stable Postop Assessment: no apparent nausea or vomiting Anesthetic complications: no   She did have some expiratory wheezing in PACU, given a Duoneb with some improvement.  Advised sitting up in chair, try IS, repeat Duoneb if SpO2 <90%.  Piscitello taking over care.  Last Vitals:  Vitals:   06/14/18 1030 06/14/18 1040  BP: (!) 138/47 128/65  Pulse: 89 96  Resp: 17 (!) 23  Temp: (!) 36.1 C   SpO2: 100% 91%    Last Pain:  Vitals:   06/14/18 1030  TempSrc:   PainSc: Asleep                 Jovita Gamma

## 2018-06-14 NOTE — Anesthesia Procedure Notes (Signed)
Performed by: Ladonya Jerkins, CRNA Pre-anesthesia Checklist: Patient identified, Emergency Drugs available, Suction available and Patient being monitored Patient Re-evaluated:Patient Re-evaluated prior to induction Oxygen Delivery Method: Circle system utilized Preoxygenation: Pre-oxygenation with 100% oxygen Induction Type: IV induction Ventilation: Mask ventilation without difficulty and Mask ventilation throughout procedure Airway Equipment and Method: Bite block Placement Confirmation: positive ETCO2 Dental Injury: Teeth and Oropharynx as per pre-operative assessment        

## 2018-06-14 NOTE — Progress Notes (Signed)
Pt not to be visibly short of breath with wheezing and diminished breath sounds on auscultation. O2 sat's 87% to 91%. Dr. Sampson Goon notified. Acknowledged. Orders received.

## 2018-06-14 NOTE — H&P (Signed)
Jenna Dudley is an 83 y.o. female.   Chief Complaint: Patient is reporting no specific chief complaint.  Mood is feeling good.  No physical problems HPI: History of recurrent severe depression  Past Medical History:  Diagnosis Date  . Bilateral carpal tunnel syndrome 07/27/14  . Bipolar disorder [facility])    geropsych admission to [facility] in Dec 2018  . Chronic diastolic CHF (congestive heart failure) (HCC)    a. 03/2017 Echo: EF 55-60%, no rwma, Gr1 DD, mild MR.  . Chronic low back pain 07/29/14  . Degenerative arthritis of lumbar spine 07/27/14  . Dementia (HCC)   . Depression   . Esophageal spasm 07/29/14  . GERD (gastroesophageal reflux disease)   . Hyperlipemia 07/29/14  . Hypertension   . Pneumonia   . Spinal stenosis 07/29/14    Past Surgical History:  Procedure Laterality Date  . HIP SURGERY Right   . INNER EAR SURGERY Right   . REPLACEMENT TOTAL KNEE BILATERAL Bilateral 07/27/14    Family History  Problem Relation Age of Onset  . Hypertension Mother   . Stroke Mother    Social History:  reports that she quit smoking about 42 years ago. Her smoking use included cigarettes. She has never used smokeless tobacco. She reports that she does not drink alcohol or use drugs.  Allergies:  Allergies  Allergen Reactions  . Aspirin Other (See Comments)    Unknown reaction  . Sulfa Antibiotics Other (See Comments)    Unknown reaction    (Not in a hospital admission)   No results found for this or any previous visit (from the past 48 hour(s)). No results found.  Review of Systems  Constitutional: Negative.   HENT: Negative.   Eyes: Negative.   Respiratory: Negative.   Cardiovascular: Negative.   Gastrointestinal: Negative.   Musculoskeletal: Negative.   Skin: Negative.   Neurological: Negative.   Psychiatric/Behavioral: Negative.     Blood pressure (!) 109/51, pulse 82, temperature 98.3 F (36.8 C), temperature source Oral, resp. rate 16, height 5\' 2"  (1.575 m), weight  71.7 kg, SpO2 95 %. Physical Exam  Nursing note and vitals reviewed. Constitutional: She appears well-developed and well-nourished.  HENT:  Head: Normocephalic and atraumatic.  Eyes: Pupils are equal, round, and reactive to light. Conjunctivae are normal.  Neck: Normal range of motion.  Cardiovascular: Normal heart sounds.  Respiratory: Effort normal.  GI: Soft.  Musculoskeletal: Normal range of motion.  Neurological: She is alert.  Skin: Skin is warm and dry.  Psychiatric: She has a normal mood and affect. Her behavior is normal. Judgment and thought content normal.     Assessment/Plan Doing very well.  We will schedule a follow-up in about 4 weeks.  Mordecai Rasmussen, MD 06/14/2018, 10:12 AM

## 2018-06-14 NOTE — Anesthesia Post-op Follow-up Note (Signed)
Anesthesia QCDR form completed.        

## 2018-06-14 NOTE — Discharge Instructions (Signed)
1)  The drugs that you have been given will stay in your system until tomorrow so for the       next 24 hours you should not:  A. Drive an automobile  B. Make any legal decisions  C. Drink any alcoholic beverages  2)  You may resume your regular meals upon return home.  3)  A responsible adult must take you home.  Someone should stay with you for a few          hours, then be available by phone for the remainder of the treatment day.  4)  You May experience any of the following symptoms:  Headache, Nausea and a dry mouth (due to the medications you were given),  temporary memory loss and some confusion, or sore muscles (a warm bath  should help this).  If you you experience any of these symptoms let us know on                your return visit.  5)  Report any of the following: any acute discomfort, severe headache, or temperature        greater than 100.5 F.   Also report any unusual redness, swelling, drainage, or pain         at your IV site.    You may report Symptoms to:  ECT PROGRAM- [place] [facility]          Phone: [phone], ECT Department           or Dr. Shary Key office [phone]  6)  Your next ECT Treatment is Wednesday April 15   We will call 2 days prior to your scheduled appointment for arrival times.  7)  Nothing to eat or drink after midnight the night before your procedure.  8)  Take      With a sip of water the morning of your procedure.  9)  Other Instructions: Call [phone] to cancel the morning of your procedure due         to illness or emergency.  10) We will call within 72 hours to assess how you are feeling.

## 2018-07-08 ENCOUNTER — Telehealth: Payer: Self-pay

## 2018-07-12 ENCOUNTER — Other Ambulatory Visit: Payer: Self-pay

## 2018-07-12 ENCOUNTER — Encounter: Payer: Self-pay | Admitting: Anesthesiology

## 2018-07-12 ENCOUNTER — Other Ambulatory Visit: Payer: Self-pay | Admitting: Psychiatry

## 2018-07-12 ENCOUNTER — Ambulatory Visit
Admission: RE | Admit: 2018-07-12 | Discharge: 2018-07-12 | Disposition: A | Discharge status: Home / Self Care | Payer: Medicare Other | Source: Ambulatory Visit | Attending: Psychiatry | Admitting: Psychiatry

## 2018-07-12 DIAGNOSIS — K219 Gastro-esophageal reflux disease without esophagitis: Secondary | ICD-10-CM | POA: Insufficient documentation

## 2018-07-12 DIAGNOSIS — I110 Hypertensive heart disease with heart failure: Secondary | ICD-10-CM | POA: Insufficient documentation

## 2018-07-12 DIAGNOSIS — Z8249 Family history of ischemic heart disease and other diseases of the circulatory system: Secondary | ICD-10-CM | POA: Diagnosis not present

## 2018-07-12 DIAGNOSIS — G8929 Other chronic pain: Secondary | ICD-10-CM | POA: Diagnosis not present

## 2018-07-12 DIAGNOSIS — F332 Major depressive disorder, 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 features: Secondary | ICD-10-CM

## 2018-07-12 DIAGNOSIS — F333 Major depressive disorder, recurrent, severe with psychotic symptoms: Secondary | ICD-10-CM | POA: Diagnosis not present

## 2018-07-12 DIAGNOSIS — Z87891 Personal history of nicotine dependence: Secondary | ICD-10-CM | POA: Diagnosis not present

## 2018-07-12 DIAGNOSIS — E785 Hyperlipidemia, unspecified: Secondary | ICD-10-CM | POA: Diagnosis not present

## 2018-07-12 DIAGNOSIS — I5032 Chronic diastolic (congestive) heart failure: Secondary | ICD-10-CM | POA: Insufficient documentation

## 2018-07-12 DIAGNOSIS — F0390 Unspecified dementia without behavioral disturbance: Secondary | ICD-10-CM | POA: Diagnosis not present

## 2018-07-12 MED ORDER — LABETALOL HCL 5 MG/ML IV SOLN
INTRAVENOUS | Status: DC | PRN
  Administered 2018-07-12: 20 mg via INTRAVENOUS

## 2018-07-12 MED ORDER — METHOHEXITAL SODIUM 100 MG/10ML IV SOSY
PREFILLED_SYRINGE | INTRAVENOUS | Status: DC | PRN
  Administered 2018-07-12: 60 mg via INTRAVENOUS

## 2018-07-12 MED ORDER — SUCCINYLCHOLINE CHLORIDE 20 MG/ML IJ SOLN
INTRAMUSCULAR | Status: DC | PRN
  Administered 2018-07-12: 80 mg via INTRAVENOUS

## 2018-07-12 NOTE — Transfer of Care (Signed)
Immediate Anesthesia Transfer of Care Note  Patient: Jenna Dudley  Procedure(s) Performed: ECT TX  Patient Location: PACU  Anesthesia Type:General  Level of Consciousness: sedated  Airway & Oxygen Therapy: Patient Spontanous Breathing and Patient connected to face mask oxygen  Post-op Assessment: Report given to RN and Post -op Vital signs reviewed and stable  Post vital signs: Reviewed and stable  Last Vitals:  Vitals Value Taken Time  BP    Temp    Pulse 85 07/12/2018 11:33 AM  Resp 20 07/12/2018 11:33 AM  SpO2 99 % 07/12/2018 11:33 AM  Vitals shown include unvalidated device data.  Last Pain:  Vitals:   07/12/18 0924  TempSrc: Oral  PainSc: 0-No pain         Complications: No apparent anesthesia complications

## 2018-07-12 NOTE — Anesthesia Post-op Follow-up Note (Signed)
Anesthesia QCDR form completed.        

## 2018-07-12 NOTE — Anesthesia Preprocedure Evaluation (Signed)
Anesthesia Evaluation  Patient identified by MRN, date of birth, ID band Patient awake    Reviewed: Allergy & Precautions, H&P , NPO status , Patient's Chart, lab work & pertinent test results, reviewed documented beta blocker date and time   Airway Mallampati: II   Neck ROM: full    Dental  (+) Poor Dentition   Pulmonary neg pulmonary ROS, pneumonia, former smoker,    Pulmonary exam normal        Cardiovascular Exercise Tolerance: Poor hypertension, On Medications +CHF  negative cardio ROS Normal cardiovascular exam Rhythm:regular Rate:Normal     Neuro/Psych PSYCHIATRIC DISORDERS Depression Bipolar Disorder Dementia  Neuromuscular disease    GI/Hepatic Neg liver ROS, GERD  ,  Endo/Other  negative endocrine ROS  Renal/GU negative Renal ROS  negative genitourinary   Musculoskeletal   Abdominal   Peds  Hematology negative hematology ROS (+)   Anesthesia Other Findings Past Medical History: 07/27/14: Bilateral carpal tunnel syndrome No date: Bipolar disorder (HCC)     Comment:  geropsych admission to [facility] in Dec 2018 No date: Chronic diastolic CHF (congestive heart failure) (HCC)     Comment:  a. 03/2017 Echo: EF 55-60%, no rwma, Gr1 DD, mild MR. 07/29/14: Chronic low back pain 07/27/14: Degenerative arthritis of lumbar spine No date: Dementia (HCC) No date: Depression 07/29/14: Esophageal spasm No date: GERD (gastroesophageal reflux disease) 07/29/14: Hyperlipemia No date: Hypertension No date: Pneumonia 07/29/14: Spinal stenosis Past Surgical History: No date: HIP SURGERY; Right No date: INNER EAR SURGERY; Right 07/27/14: REPLACEMENT TOTAL KNEE BILATERAL; Bilateral   Reproductive/Obstetrics negative OB ROS                             Anesthesia Physical Anesthesia Plan  ASA: III  Anesthesia Plan: General   Post-op Pain Management:    Induction:   PONV Risk Score and Plan:    Airway Management Planned:   Additional Equipment:   Intra-op Plan:   Post-operative Plan:   Informed Consent: I have reviewed the patients History and Physical, chart, labs and discussed the procedure including the risks, benefits and alternatives for the proposed anesthesia with the patient or authorized representative who has indicated his/her understanding and acceptance.     Dental Advisory Given  Plan Discussed with: CRNA  Anesthesia Plan Comments:         Anesthesia Quick Evaluation

## 2018-07-12 NOTE — Procedures (Signed)
ECT SERVICES Physician's Interval Evaluation & Treatment Note  Patient Identification: KIARAH WIERINGA MRN:  [id number] Date of Evaluation:  07/12/2018 TX #: 22  MADRS:   MMSE:   P.E. Findings:  No change in physical exam  Psychiatric Interval Note:  Seems more anxious but not psychotic not suicidal  Subjective:  Patient is a 83 y.o. female seen for evaluation for Electroconvulsive Therapy. More nervous than last time  Treatment Summary:   [x]   Right Unilateral             []  Bilateral   % Energy : 0.3 ms 90%   Impedance: 1940 ohms  Seizure Energy Index:    Postictal Suppression Index:    Seizure Concordance Index: 60%  Medications  Pre Shock: Labetalol 20 mg Brevital 60 mg succinylcholine 80 mg  Post Shock:    Seizure Duration: 24 seconds EMG 48 seconds EEG   Comments: Follow-up 3 weeks  Lungs:  [x]   Clear to auscultation               []  Other:   Heart:    [x]   Regular rhythm             []  irregular rhythm    [x]   Previous H&P reviewed, patient examined and there are NO CHANGES                 []   Previous H&P reviewed, patient examined and there are changes noted.   Mordecai Rasmussen, MD 4/17/202011:26 AM

## 2018-07-12 NOTE — Discharge Instructions (Signed)
1)  The drugs that you have been given will stay in your system until tomorrow so for the       next 24 hours you should not: ° A. Drive an automobile ° B. Make any legal decisions ° C. Drink any alcoholic beverages ° °2)  You may resume your regular meals upon return home. ° °3)  A responsible adult must take you home.  Someone should stay with you for a few          hours, then be available by phone for the remainder of the treatment day. ° °4)  You May experience any of the following symptoms: ° Headache, Nausea and a dry mouth (due to the medications you were given),  temporary memory loss and some confusion, or sore muscles (a warm bath  should help this).  If you you experience any of these symptoms let us know on                your return visit. ° °5)  Report any of the following: any acute discomfort, severe headache, or temperature        greater than 100.5 F.   Also report any unusual redness, swelling, drainage, or pain         at your IV site. ° °  You may report Symptoms to:  ECT PROGRAM- [place] at [facility] °         Phone: [phone], ECT Department  °         or Dr. Clapac's office [phone] ° °6)  Your next ECT Treatment is Wednesday May 13  ° We will call 2 days prior to your scheduled appointment for arrival times. ° °7)  Nothing to eat or drink after midnight the night before your procedure. ° °8)  Take     With a sip of water the morning of your procedure. ° °9)  Other Instructions: Call [phone] to cancel the morning of your procedure due         to illness or emergency. ° °10) We will call within 72 hours to assess how you are feeling.  °

## 2018-07-12 NOTE — Anesthesia Procedure Notes (Signed)
Date/Time: 07/12/2018 11:16 AM Performed by: Junious Silk, CRNA Pre-anesthesia Checklist: Patient identified, Timeout performed, Emergency Drugs available, Suction available and Patient being monitored Oxygen Delivery Method: Ambu bag

## 2018-07-12 NOTE — H&P (Signed)
Jenna Dudley is an 83 y.o. female.   Chief Complaint: Seems significantly more anxious HPI: Recurrent severe depression with psychosis  Past Medical History:  Diagnosis Date  . Bilateral carpal tunnel syndrome 07/27/14  . Bipolar disorder [facility])    geropsych admission to [facility] in Dec 2018  . Chronic diastolic CHF (congestive heart failure) (HCC)    a. 03/2017 Echo: EF 55-60%, no rwma, Gr1 DD, mild MR.  . Chronic low back pain 07/29/14  . Degenerative arthritis of lumbar spine 07/27/14  . Dementia (HCC)   . Depression   . Esophageal spasm 07/29/14  . GERD (gastroesophageal reflux disease)   . Hyperlipemia 07/29/14  . Hypertension   . Pneumonia   . Spinal stenosis 07/29/14    Past Surgical History:  Procedure Laterality Date  . HIP SURGERY Right   . INNER EAR SURGERY Right   . REPLACEMENT TOTAL KNEE BILATERAL Bilateral 07/27/14    Family History  Problem Relation Age of Onset  . Hypertension Mother   . Stroke Mother    Social History:  reports that she quit smoking about 42 years ago. Her smoking use included cigarettes. She has never used smokeless tobacco. She reports that she does not drink alcohol or use drugs.  Allergies:  Allergies  Allergen Reactions  . Aspirin Other (See Comments)    Unknown reaction  . Sulfa Antibiotics Other (See Comments)    Unknown reaction    (Not in a hospital admission)   No results found for this or any previous visit (from the past 48 hour(s)). No results found.  Review of Systems  Constitutional: Negative.   HENT: Negative.   Eyes: Negative.   Respiratory: Negative.   Cardiovascular: Negative.   Gastrointestinal: Negative.   Musculoskeletal: Negative.   Skin: Negative.   Neurological: Negative.   Psychiatric/Behavioral: Negative for depression, substance abuse and suicidal ideas. The patient is nervous/anxious.     Blood pressure (!) 144/64, pulse 79, temperature 98.1 F (36.7 C), temperature source Oral, resp. rate 18, SpO2 98  %. Physical Exam  Nursing note and vitals reviewed. Constitutional: She appears well-developed and well-nourished.  HENT:  Head: Normocephalic and atraumatic.  Eyes: Pupils are equal, round, and reactive to light. Conjunctivae are normal.  Neck: Normal range of motion.  Cardiovascular: Regular rhythm and normal heart sounds.  Respiratory: Effort normal.  GI: Soft.  Musculoskeletal: Normal range of motion.  Neurological: She is alert.  Skin: Skin is warm and dry.  Psychiatric: Her behavior is normal. Judgment and thought content normal. Her mood appears anxious. Her affect is blunt.     Assessment/Plan Patient gets ECT today and I like to try to see her back in 3 weeks if we can  Mordecai Rasmussen, MD 07/12/2018, 11:24 AM

## 2018-07-17 NOTE — Anesthesia Postprocedure Evaluation (Signed)
Anesthesia Post Note  Patient: Jenna Dudley  Procedure(s) Performed: ECT TX  Patient location during evaluation: PACU Anesthesia Type: General Level of consciousness: awake and alert Pain management: pain level controlled Vital Signs Assessment: post-procedure vital signs reviewed and stable Respiratory status: spontaneous breathing, nonlabored ventilation, respiratory function stable and patient connected to nasal cannula oxygen Cardiovascular status: blood pressure returned to baseline and stable Postop Assessment: no apparent nausea or vomiting Anesthetic complications: no     Last Vitals:  Vitals:   07/12/18 1201 07/12/18 1206  BP: 130/68 130/68  Pulse: 81 81  Resp: 18 (!) 21  Temp:  37.2 C  SpO2:  94%    Last Pain:  Vitals:   07/12/18 1201  TempSrc:   PainSc: 0-No pain                 Yevette Edwards

## 2018-07-22 ENCOUNTER — Other Ambulatory Visit: Payer: Self-pay

## 2018-07-22 MED ORDER — METOPROLOL TARTRATE 50 MG PO TABS: 50.0000 mg | ORAL_TABLET | Freq: Two times a day (BID) | ORAL | 1 refills | Status: DC

## 2018-07-22 NOTE — Telephone Encounter (Signed)
*  STAT* If patient is at the pharmacy, call can be transferred to refill team.   1. Which medications need to be refilled? (please list name of each medication and dose if known) Lopressor  2. Which pharmacy/location (including street and city if local pharmacy) is medication to be sent to? [facility]  3. Do they need a 30 day or 90 day supply? 30

## 2018-07-24 ENCOUNTER — Telehealth: Payer: Self-pay | Admitting: Cardiovascular Disease

## 2018-07-24 NOTE — Telephone Encounter (Signed)
Virtual Visit Pre-Appointment Phone Call  "(Name), I am calling you today to discuss your upcoming appointment. We are currently trying to limit exposure to the virus that causes COVID-19 by seeing patients at home rather than in the office."  1. "What is the BEST phone number to call the day of the visit?" - include this in appointment notes  2. Do you have or have access to (through a family member/friend) a smartphone with video capability that we can use for your visit?" a. If yes - list this number in appt notes as cell (if different from BEST phone #) and list the appointment type as a VIDEO visit in appointment notes b. If no - list the appointment type as a PHONE visit in appointment notes  3. Confirm consent - "In the setting of the current Covid19 crisis, you are scheduled for a (phone or video) visit with your provider on (date) at (time).  Just as we do with many in-office visits, in order for you to participate in this visit, we must obtain consent.  If you'd like, I can send this to your mychart (if signed up) or email for you to review.  Otherwise, I can obtain your verbal consent now.  All virtual visits are billed to your insurance company just like a normal visit would be.  By agreeing to a virtual visit, we'd like you to understand that the technology does not allow for your provider to perform an examination, and thus may limit your provider's ability to fully assess your condition. If your provider identifies any concerns that need to be evaluated in person, we will make arrangements to do so.  Finally, though the technology is pretty good, we cannot assure that it will always work on either your or our end, and in the setting of a video visit, we may have to convert it to a phone-only visit.  In either situation, we cannot ensure that we have a secure connection.  Are you willing to proceed?" STAFF: Did the patient verbally acknowledge consent to telehealth visit? Document  YES/NO here: yes  4. Advise patient to be prepared - "Two hours prior to your appointment, go ahead and check your blood pressure, pulse, oxygen saturation, and your weight (if you have the equipment to check those) and write them all down. When your visit starts, your provider will ask you for this information. If you have an Apple Watch or Kardia device, please plan to have heart rate information ready on the day of your appointment. Please have a pen and paper handy nearby the day of the visit as well."  5. Give patient instructions for MyChart download to smartphone OR Doximity/Doxy.me as below if video visit (depending on what platform provider is using)  6. Inform patient they will receive a phone call 15 minutes prior to their appointment time (may be from unknown caller ID) so they should be prepared to answer    TELEPHONE CALL NOTE  Jenna DaftJoan M Langer has been deemed a candidate for a follow-up tele-health visit to limit community exposure during the Covid-19 pandemic. I spoke with the patient via phone to ensure availability of phone/video source, confirm preferred email & phone number, and discuss instructions and expectations.  I reminded Jenna Dudley to be prepared with any vital sign and/or heart rhythm information that could potentially be obtained via home monitoring, at the time of her visit. I reminded Jenna DaftJoan M Tool to expect a phone call prior to  her visit.  Joline Maxcy 07/24/2018 2:56 PM   INSTRUCTIONS FOR DOWNLOADING THE MYCHART APP TO SMARTPHONE  - The patient must first make sure to have activated MyChart and know their login information - If Apple, go to [employer] and type in MyChart in the search bar and download the app. If Android, ask patient to go to [employer] and type in [place] in the search bar and download the app. The app is free but as with any other app downloads, their phone may require them to verify saved payment information or Apple/Android  password.  - The patient will need to then log into the app with their MyChart username and password, and select  as their healthcare provider to link the account. When it is time for your visit, go to the MyChart app, find appointments, and click Begin Video Visit. Be sure to Select Allow for your device to access the Microphone and Camera for your visit. You will then be connected, and your provider will be with you shortly.  **If they have any issues connecting, or need assistance please contact MyChart service desk [phone])**  **If using a computer, in order to ensure the best quality for their visit they will need to use either of the following Internet Browsers: [employer] or Google Chrome**  IF USING DOXIMITY or DOXY.ME - The patient will receive a link just prior to their visit by text.     FULL LENGTH CONSENT FOR TELE-HEALTH VISIT   I hereby voluntarily request, consent and authorize [facility] and its employed or contracted physicians, physician assistants, nurse practitioners or other licensed health care professionals (the Practitioner), to provide me with telemedicine health care services (the Services") as deemed necessary by the treating Practitioner. I acknowledge and consent to receive the Services by the Practitioner via telemedicine. I understand that the telemedicine visit will involve communicating with the Practitioner through live audiovisual communication technology and the disclosure of certain medical information by electronic transmission. I acknowledge that I have been given the opportunity to request an in-person assessment or other available alternative prior to the telemedicine visit and am voluntarily participating in the telemedicine visit.  I understand that I have the right to withhold or withdraw my consent to the use of telemedicine in the course of my care at any time, without affecting my right to future care or treatment,  and that the Practitioner or I may terminate the telemedicine visit at any time. I understand that I have the right to inspect all information obtained and/or recorded in the course of the telemedicine visit and may receive copies of available information for a reasonable fee.  I understand that some of the potential risks of receiving the Services via telemedicine include:   Delay or interruption in medical evaluation due to technological equipment failure or disruption;  Information transmitted may not be sufficient (e.g. poor resolution of images) to allow for appropriate medical decision making by the Practitioner; and/or   In rare instances, security protocols could fail, causing a breach of personal health information.  Furthermore, I acknowledge that it is my responsibility to provide information about my medical history, conditions and care that is complete and accurate to the best of my ability. I acknowledge that Practitioner's advice, recommendations, and/or decision may be based on factors not within their control, such as incomplete or inaccurate data provided by me or distortions of diagnostic images or specimens that may result from electronic transmissions. I  understand that the practice of medicine is not an exact science and that Practitioner makes no warranties or guarantees regarding treatment outcomes. I acknowledge that I will receive a copy of this consent concurrently upon execution via email to the email address I last provided but may also request a printed copy by calling the office of [place].    I understand that my insurance will be billed for this visit.   I have read or had this consent read to me.  I understand the contents of this consent, which adequately explains the benefits and risks of the Services being provided via telemedicine.   I have been provided ample opportunity to ask questions regarding this consent and the Services and have had my questions  answered to my satisfaction.  I give my informed consent for the services to be provided through the use of telemedicine in my medical care  By participating in this telemedicine visit I agree to the above.

## 2018-07-25 ENCOUNTER — Telehealth (INDEPENDENT_AMBULATORY_CARE_PROVIDER_SITE_OTHER): Payer: Medicare Other | Admitting: Nurse Practitioner

## 2018-07-25 ENCOUNTER — Encounter: Payer: Self-pay | Admitting: Nurse Practitioner

## 2018-07-25 ENCOUNTER — Other Ambulatory Visit: Payer: Self-pay

## 2018-07-25 VITALS — Ht 62.0 in | Wt 154.1 lb

## 2018-07-25 DIAGNOSIS — I5032 Chronic diastolic (congestive) heart failure: Secondary | ICD-10-CM

## 2018-07-25 NOTE — Progress Notes (Signed)
Virtual Visit via Video Note   This visit type was conducted due to national recommendations for restrictions regarding the COVID-19 Pandemic (e.g. social distancing) in an effort to limit this patient's exposure and mitigate transmission in our community.  Due to her co-morbid illnesses, this patient is at least at moderate risk for complications without adequate follow up.  This format is felt to be most appropriate for this patient at this time.  All issues noted in this document were discussed and addressed.  A limited physical exam was performed with this format.  Please refer to the patient's chart for her consent to telehealth for [facility]. Evaluation Performed:  Follow-up visit  This visit type was conducted due to national recommendations for restrictions regarding the COVID-19 Pandemic (e.g. social distancing).  This format is felt to be most appropriate for this patient at this time.  All issues noted in this document were discussed and addressed.  No physical exam was performed (except for noted visual exam findings with Video Visits).  Please refer to the patient's chart (MyChart message for video visits and phone note for telephone visits) for the patient's consent to telehealth for [facility]. _____________   Date:  07/25/2018   Patient ID:  Jenna Dudley, DOB 1935-04-27, MRN [id number] Patient Location:  home Provider location:   office  Primary Care Provider:  Aderoju, Marin Olp, MD Primary Cardiologist:  Lorine Bears, MD  Chief Complaint    83 year old female with a history of bipolar disorder, hypertension, hyperlipidemia, GERD, and chronic low back pain, who presents for follow-up related to HFpEF.  Past Medical History    Past Medical History:  Diagnosis Date  . Bilateral carpal tunnel syndrome 07/27/14  . Bipolar disorder [facility])    geropsych admission to [facility] in Dec 2018  . Chronic diastolic CHF (congestive heart failure) (HCC)    a.  03/2017 Echo: EF 55-60%, no rwma, Gr1 DD, mild MR.  . Chronic low back pain 07/29/14  . Degenerative arthritis of lumbar spine 07/27/14  . Dementia (HCC)   . Depression   . Esophageal spasm 07/29/14  . GERD (gastroesophageal reflux disease)   . History of stress test    a. 04/2017 MV: low risk stress test.  EF 55-65%. Probable apical ant/apical, basal, and mid inflat attenuation artifact vs ischemia/scar.  . Hyperlipemia 07/29/14  . Hypertension   . Pneumonia   . Spinal stenosis 07/29/14   Past Surgical History:  Procedure Laterality Date  . HIP SURGERY Right   . INNER EAR SURGERY Right   . REPLACEMENT TOTAL KNEE BILATERAL Bilateral 07/27/14    Allergies  Allergies  Allergen Reactions  . Aspirin Other (See Comments)    Unknown reaction  . Sulfa Antibiotics Other (See Comments)    Unknown reaction    History of Present Illness    Jenna Dudley is a 83 y.o. female who presents via audio/video conferencing for a telehealth visit today.  Her husband is present on video visit today.  As above, she has a history of bipolar disorder, hypertension, hyperlipidemia, GERD, and chronic low back pain.  She was [facility] to  [facility] in early [facility] with a one-week history of dyspnea and volume overload.  Echo showed normal LV function with grade 1 diastolic dysfunction.  She had mild troponin elevation to a peak of 0.06.  She was not felt to be a good candidate for ischemic evaluation secondary to dementia and cardiac evaluation was deferred.  Follow-up outpatient stress testing  was low risk.  She was last seen in cardiology clinic in October 2019, at which time she was doing well.  Since then, she has continued to do well.  Her weight has been stable at home and she is weighing daily and careful with salt intake.  She does not check her blood pressure regularly and does not have any recordings for today.  She denies chest pain, dyspnea, palpitations, PND, orthopnea, dizziness, syncope, edema, or early  satiety.  She and her husband are appropriately social distancing and wearing masks when in public.  The patient does not have symptoms concerning for COVID-19 infection (fever, chills, cough, or new shortness of breath).   Home Medications    Prior to Admission medications   Medication Sig Start Date End Date Taking? Authorizing Provider  acetaminophen-codeine (TYLENOL #3) 300-30 MG per tablet Take 1 tablet by mouth every 8 (eight) hours as needed for moderate pain or severe pain.   Yes [provider]  cholecalciferol (VITAMIN D) 1000 units tablet Take 1,000 Units by mouth daily.   Yes [provider]  ferrous sulfate 325 (65 FE) MG EC tablet Take 325 mg by mouth daily with breakfast.   Yes [provider]  furosemide (LASIX) 20 MG tablet TAKE 1 TABLET BY MOUTH ONCE DAILY 06/10/18  Yes Creig HinesBerge, Avier Jech Ronald, NP  losartan (COZAAR) 25 MG tablet Take 1 tablet (25 mg total) by mouth daily. 09/26/14  Yes Pucilowska, Jolanta B, MD  magnesium oxide (MAG-OX) 400 MG tablet Take 400 mg by mouth daily.   Yes [provider]  metoprolol tartrate (LOPRESSOR) 50 MG tablet Take 1 tablet (50 mg total) by mouth 2 (two) times daily. Pt needs an appointment 07/22/18  Yes Antonieta IbaGollan, Timothy J, MD  mirtazapine (REMERON) 45 MG tablet Take 1 tablet (45 mg total) by mouth at bedtime. 06/13/18  Yes Clapacs, Jackquline DenmarkJohn T, MD  OLANZapine (ZYPREXA) 20 MG tablet TAKE 1 TABLET BY MOUTH AT BEDTIME 06/17/18  Yes Clapacs, Jackquline DenmarkJohn T, MD  omeprazole (PRILOSEC) 20 MG capsule Take 20 mg by mouth 2 (two) times daily before a meal.    Yes [provider]  simvastatin (ZOCOR) 40 MG tablet Take 40 mg by mouth at bedtime.    Yes [provider]  venlafaxine XR (EFFEXOR-XR) 150 MG 24 hr capsule TAKE 1 CAPSULE BY MOUTH DAILY WITH BREAKFAST 06/13/18  Yes Clapacs, Jackquline DenmarkJohn T, MD  OLANZapine (ZYPREXA) 20 MG tablet Take 1 tablet (20 mg total) by mouth at bedtime. Patient not taking: Reported on 07/25/2018  06/13/18   Clapacs, Jackquline DenmarkJohn T, MD    Review of Systems    She denies chest pain, palpitations, dyspnea, pnd, orthopnea, n, v, dizziness, syncope, edema, weight gain, or early satiety.  She frequently looks to her husband for answers and he reports that she has been doing well and has not offered of any complaints recently..  All other systems reviewed and are otherwise negative except as noted above.  Physical Exam    Vital Signs:  Ht 5\' 2"  (1.575 m)   Wt 154 lb 2 oz (69.9 kg)   LMP  (LMP Unknown)   BMI 28.19 kg/m    Well nourished, well developed female in no acute distress.  She is alert and oriented to person and place.  HEENT is grossly normal.  Respirations regular and unlabored.  Accessory Clinical Findings    Lab Results  Component Value Date   CREATININE 0.70 12/25/2017   BUN 15 12/25/2017  NA 138 12/25/2017   K 3.9 12/25/2017   CL 103 12/25/2017   CO2 26 12/25/2017    Lab Results  Component Value Date   WBC 7.1 12/25/2017   HGB 8.7 (L) 12/25/2017   HCT 24.9 (L) 12/25/2017   MCV 101.0 (H) 12/25/2017   PLT 168 12/25/2017     Assessment & Plan    1.  Chronic diastolic congestive heart failure: She has been doing well at home per her she and her husband.  She has not been having any significant dyspnea or edema, and weight has been stable.  They do not know what her blood pressure is trending.  She remains on beta-blocker, ARB, Lasix therapy.  We discussed the importance of daily weights, sodium restriction, medication compliance, and symptom reporting and she verbalizes understanding.   2.  Essential hypertension: Neither she nor her husband check her blood pressure.  I recommend they consider doing so.  She remains on beta-blocker, ARB, and diuretic therapy.  3.  Hyperlipidemia: Currently on statin therapy with an LDL of 62 in May 2019 per labs in care everywhere.  4.  Bipolar disorder and dementia: Followed by primary care.  5.  Disposition: Follow-up with Dr.  Kirke Corin in 6 months or sooner if necessary.  COVID-19 Education: The signs and symptoms of COVID-19 were discussed with the patient and how to seek care for testing (follow up with PCP or arrange E-visit).  The importance of social distancing was discussed today.  Patient Risk:   After full review of this patient's history and clinical status, I feel that he is at least moderate risk for cardiac complications at this time, thus necessitating a telehealth visit sooner than our first available in office visit.  Time:   Today, I have spent 15 minutes with the patient with telehealth technology discussing symptoms, heart failure management/education, and plan.Nicolasa Ducking, NP 07/25/2018, 4:10 PM

## 2018-07-25 NOTE — Patient Instructions (Signed)
It was a pleasure to speak with you on the phone today! Thank you for allowing [place] to continue taking care of your [facility] needs during this time.   Feel free to call as needed for questions and concerns related to your cardiac needs.   Medication Instructions:  Your physician recommends that you continue on your current medications as directed. Please refer to the Current Medication list given to you today.  If you need a refill on your cardiac medications before your next appointment, please call your pharmacy.   Lab work: None ordered  If you have labs (blood work) drawn today and your tests are completely normal, you will receive your results only by: Marland Kitchen MyChart Message (if you have MyChart) OR . A paper copy in the mail If you have any lab test that is abnormal or we need to change your treatment, we will call you to review the results.  Testing/Procedures: None ordered  Follow-Up: At [facility], you and your health needs are our priority.  As part of our continuing mission to provide you with exceptional heart care, we have created designated Provider Care Teams.  These Care Teams include your primary Cardiologist (physician) and Advanced Practice Providers (APPs -  Physician Assistants and Nurse Practitioners) who all work together to provide you with the care you need, when you need it. You will need a follow up appointment in 6 months.  Please call our office 2 months in advance to schedule this appointment with Lorine Bears, MD.

## 2018-08-07 ENCOUNTER — Encounter: Payer: Self-pay | Admitting: Anesthesiology

## 2018-08-07 ENCOUNTER — Other Ambulatory Visit: Payer: Self-pay

## 2018-08-07 ENCOUNTER — Encounter
Admission: RE | Admit: 2018-08-07 | Discharge: 2018-08-07 | Disposition: A | Discharge status: Home / Self Care | Payer: Medicare Other | Source: Ambulatory Visit | Attending: Psychiatry | Admitting: Psychiatry

## 2018-08-07 DIAGNOSIS — F419 Anxiety disorder, unspecified: Secondary | ICD-10-CM | POA: Insufficient documentation

## 2018-08-07 DIAGNOSIS — Z882 Allergy status to sulfonamides status: Secondary | ICD-10-CM | POA: Diagnosis not present

## 2018-08-07 DIAGNOSIS — R531 Weakness: Secondary | ICD-10-CM | POA: Diagnosis not present

## 2018-08-07 DIAGNOSIS — F0390 Unspecified dementia without behavioral disturbance: Secondary | ICD-10-CM | POA: Diagnosis not present

## 2018-08-07 DIAGNOSIS — F333 Major depressive disorder, recurrent, severe with psychotic symptoms: Secondary | ICD-10-CM

## 2018-08-07 DIAGNOSIS — R509 Fever, unspecified: Secondary | ICD-10-CM | POA: Diagnosis not present

## 2018-08-07 DIAGNOSIS — Z886 Allergy status to analgesic agent status: Secondary | ICD-10-CM | POA: Diagnosis not present

## 2018-08-07 DIAGNOSIS — I110 Hypertensive heart disease with heart failure: Secondary | ICD-10-CM | POA: Diagnosis not present

## 2018-08-07 DIAGNOSIS — I5032 Chronic diastolic (congestive) heart failure: Secondary | ICD-10-CM | POA: Insufficient documentation

## 2018-08-07 DIAGNOSIS — Z87891 Personal history of nicotine dependence: Secondary | ICD-10-CM | POA: Insufficient documentation

## 2018-08-07 DIAGNOSIS — M549 Dorsalgia, unspecified: Secondary | ICD-10-CM | POA: Insufficient documentation

## 2018-08-07 DIAGNOSIS — F319 Bipolar disorder, unspecified: Secondary | ICD-10-CM | POA: Diagnosis not present

## 2018-08-07 LAB — URINALYSIS, ROUTINE W REFLEX MICROSCOPIC
Bacteria, UA: NONE SEEN
Bilirubin Urine: NEGATIVE
Glucose, UA: NEGATIVE mg/dL
Hgb urine dipstick: NEGATIVE
Ketones, ur: NEGATIVE mg/dL
Nitrite: NEGATIVE
Protein, ur: NEGATIVE mg/dL
Specific Gravity, Urine: 1.02 (ref 1.005–1.030)
pH: 5 (ref 5.0–8.0)

## 2018-08-07 MED ORDER — METHOHEXITAL SODIUM 100 MG/10ML IV SOSY
PREFILLED_SYRINGE | INTRAVENOUS | Status: DC | PRN
  Administered 2018-08-07: 60 mg via INTRAVENOUS

## 2018-08-07 MED ORDER — CEPHALEXIN 500 MG PO CAPS: 500.0000 mg | ORAL_CAPSULE | Freq: Two times a day (BID) | ORAL | 0 refills | Status: AC

## 2018-08-07 MED ORDER — ONDANSETRON HCL 4 MG/2ML IJ SOLN: 4.0000 mg | Freq: Once | INTRAMUSCULAR | Status: DC | PRN

## 2018-08-07 MED ORDER — FENTANYL CITRATE (PF) 100 MCG/2ML IJ SOLN: 25.0000 ug | INTRAMUSCULAR | Status: DC | PRN

## 2018-08-07 MED ORDER — SUCCINYLCHOLINE CHLORIDE 20 MG/ML IJ SOLN
INTRAMUSCULAR | Status: AC
  Filled 2018-08-07: qty 1

## 2018-08-07 MED ORDER — LABETALOL HCL 5 MG/ML IV SOLN
INTRAVENOUS | Status: AC
  Filled 2018-08-07: qty 4

## 2018-08-07 MED ORDER — SUCCINYLCHOLINE CHLORIDE 200 MG/10ML IV SOSY
PREFILLED_SYRINGE | INTRAVENOUS | Status: DC | PRN
  Administered 2018-08-07: 80 mg via INTRAVENOUS

## 2018-08-07 MED ORDER — ESMOLOL HCL 100 MG/10ML IV SOLN
INTRAVENOUS | Status: DC | PRN
  Administered 2018-08-07: 20 mg via INTRAVENOUS

## 2018-08-07 MED ORDER — SODIUM CHLORIDE 0.9 % IV SOLN
500.0000 mL | Freq: Once | INTRAVENOUS | Status: AC
  Administered 2018-08-07: 500 mL via INTRAVENOUS

## 2018-08-07 NOTE — Procedures (Signed)
ECT SERVICES Physician's Interval Evaluation & Treatment Note  Patient Identification: Jenna Dudley MRN:  [id number] Date of Evaluation:  08/07/2018 TX #: [id number]  MADRS:   MMSE:   P.E. Findings:  No change physical exam.  She had a urine analysis today which had positive leukocyte esterase although no nitrates.  No observable bacteria.  Psychiatric Interval Note:  Seems more nervous and down asking some of those questions that indicate that she is not feeling well.  Subjective:  Patient is a 83 y.o. female seen for evaluation for Electroconvulsive Therapy. Feeling nervous and kind of depressed.  Treatment Summary:   [x]   Right Unilateral             []  Bilateral   % Energy : 0.3 ms 90%   Impedance: 2210 ohms  Seizure Energy Index: 9401 V squared  Postictal Suppression Index: 72%  Seizure Concordance Index: 67%  Medications  Pre Shock: Labetalol 20 mg Brevital 60 mg succinylcholine 80 mg  Post Shock: None  Seizure Duration: 20 seconds EMG 52 seconds EEG   Comments: Follow-up in 2 weeks and 2 days  Lungs:  [x]   Clear to auscultation               []  Other:   Heart:    [x]   Regular rhythm             []  irregular rhythm    [x]   Previous H&P reviewed, patient examined and there are NO CHANGES                 []   Previous H&P reviewed, patient examined and there are changes noted.   Mordecai Rasmussen, MD 5/13/202010:56 AM

## 2018-08-07 NOTE — Discharge Instructions (Signed)
1)  The drugs that you have been given will stay in your system until tomorrow so for the       next 24 hours you should not:  A. Drive an automobile  B. Make any legal decisions  C. Drink any alcoholic beverages  2)  You may resume your regular meals upon return home.  3)  A responsible adult must take you home.  Someone should stay with you for a few          hours, then be available by phone for the remainder of the treatment day.  4)  You May experience any of the following symptoms:  Headache, Nausea and a dry mouth (due to the medications you were given),  temporary memory loss and some confusion, or sore muscles (a warm bath  should help this).  If you you experience any of these symptoms let us know on                your return visit.  5)  Report any of the following: any acute discomfort, severe headache, or temperature        greater than 100.5 F.   Also report any unusual redness, swelling, drainage, or pain         at your IV site.    You may report Symptoms to:  ECT PROGRAM- [place] at [facility]          Phone: [phone], ECT Department           or Dr. Shary Key office [phone]  6)  Your next ECT Treatment is Friday May 29   We will call 2 days prior to your scheduled appointment for arrival times.  7)  Nothing to eat or drink after midnight the night before your procedure.  8)  Take     With a sip of water the morning of your procedure.  9)  Other Instructions: Call [phone] to cancel the morning of your procedure due         to illness or emergency.  10) We will call within 72 hours to assess how you are feeling.

## 2018-08-07 NOTE — Anesthesia Preprocedure Evaluation (Signed)
Anesthesia Evaluation  Patient identified by MRN, date of birth, ID band Patient awake    Reviewed: Allergy & Precautions, H&P , NPO status , Patient's Chart, lab work & pertinent test results, reviewed documented beta blocker date and time   Airway Mallampati: II   Neck ROM: full    Dental  (+) Poor Dentition   Pulmonary neg pulmonary ROS, pneumonia, former smoker,    Pulmonary exam normal        Cardiovascular Exercise Tolerance: Poor hypertension, On Medications +CHF  negative cardio ROS Normal cardiovascular exam Rhythm:regular Rate:Normal     Neuro/Psych PSYCHIATRIC DISORDERS Depression Bipolar Disorder Dementia  Neuromuscular disease    GI/Hepatic Neg liver ROS, GERD  ,  Endo/Other  negative endocrine ROS  Renal/GU negative Renal ROS  negative genitourinary   Musculoskeletal   Abdominal   Peds  Hematology negative hematology ROS (+)   Anesthesia Other Findings Past Medical History: 07/27/14: Bilateral carpal tunnel syndrome No date: Bipolar disorder (HCC)     Comment:  geropsych admission to [facility] in Dec 2018 No date: Chronic diastolic CHF (congestive heart failure) (HCC)     Comment:  a. 03/2017 Echo: EF 55-60%, no rwma, Gr1 DD, mild MR. 07/29/14: Chronic low back pain 07/27/14: Degenerative arthritis of lumbar spine No date: Dementia (HCC) No date: Depression 07/29/14: Esophageal spasm No date: GERD (gastroesophageal reflux disease) 07/29/14: Hyperlipemia No date: Hypertension No date: Pneumonia 07/29/14: Spinal stenosis Past Surgical History: No date: HIP SURGERY; Right No date: INNER EAR SURGERY; Right 07/27/14: REPLACEMENT TOTAL KNEE BILATERAL; Bilateral   Reproductive/Obstetrics negative OB ROS                             Anesthesia Physical  Anesthesia Plan  ASA: III  Anesthesia Plan: General   Post-op Pain Management:    Induction: Intravenous  PONV Risk Score  and Plan:   Airway Management Planned: Mask  Additional Equipment:   Intra-op Plan:   Post-operative Plan:   Informed Consent: I have reviewed the patients History and Physical, chart, labs and discussed the procedure including the risks, benefits and alternatives for the proposed anesthesia with the patient or authorized representative who has indicated his/her understanding and acceptance.     Dental Advisory Given  Plan Discussed with: CRNA  Anesthesia Plan Comments:         Anesthesia Quick Evaluation

## 2018-08-07 NOTE — Transfer of Care (Signed)
Immediate Anesthesia Transfer of Care Note  Patient: Jenna Dudley  Procedure(s) Performed: ECT TX  Patient Location: PACU  Anesthesia Type:General  Level of Consciousness: sedated  Airway & Oxygen Therapy: Patient Spontanous Breathing and Patient connected to face mask oxygen  Post-op Assessment: Report given to RN and Post -op Vital signs reviewed and stable  Post vital signs: Reviewed and stable  Last Vitals:  Vitals Value Taken Time  BP 124/62 08/07/2018 11:28 AM  Temp    Pulse 88 08/07/2018 11:32 AM  Resp 22 08/07/2018 11:32 AM  SpO2 98 % 08/07/2018 11:32 AM  Vitals shown include unvalidated device data.  Last Pain:  Vitals:   08/07/18 0914  TempSrc:   PainSc: 0-No pain         Complications: No apparent anesthesia complications

## 2018-08-07 NOTE — Anesthesia Procedure Notes (Signed)
Date/Time: 08/07/2018 11:17 AM Performed by: Lily Kocher, CRNA Pre-anesthesia Checklist: Patient identified, Emergency Drugs available, Suction available and Patient being monitored Patient Re-evaluated:Patient Re-evaluated prior to induction Oxygen Delivery Method: Circle system utilized Preoxygenation: Pre-oxygenation with 100% oxygen Induction Type: IV induction Ventilation: Mask ventilation without difficulty and Mask ventilation throughout procedure Airway Equipment and Method: Bite block Placement Confirmation: positive ETCO2 Dental Injury: Teeth and Oropharynx as per pre-operative assessment

## 2018-08-07 NOTE — H&P (Signed)
Jenna Dudley is an 83 y.o. female.   Chief Complaint: Patient seen chart reviewed.  83 year old woman well-known to our service.  Presents with some increased anxiety.  Making statements about feeling like she is "bad".  Not suicidal.  Not obviously psychotic.  Her back pain has been worse and her weakness has been worse.  She was noted to have a very slight elevated temperature when she presented to the hospital HPI: Patient has a long history of recurrent psychotic depression  Past Medical History:  Diagnosis Date  . Bilateral carpal tunnel syndrome 07/27/14  . Bipolar disorder [facility](HCC)    ger[facility]ych admission [facility] in Dec 2018  . Chronic diastolic CHF (congestive heart failure) (HCC)    a. 03/2017 Echo: EF 55-60%, no rwma, Gr1 DD, mild MR.  . Chronic low back pain 07/29/14  . Degenerative arthritis of lumbar spine 07/27/14  . Dementia (HCC)   . Depression   . Esophageal spasm 07/29/14  . GERD (gastroesophageal reflux disease)   . History of stress test    a. 04/2017 MV: low risk stress test.  EF 55-65%. Probable apical ant/apical, basal, and mid inflat attenuation artifact vs ischemia/scar.  . Hyperlipemia 07/29/14  . Hypertension   . Pneumonia   . Spinal stenosis 07/29/14    Past Surgical History:  Procedure Laterality Date  . HIP SURGERY Right   . INNER EAR SURGERY Right   . REPLACEMENT TOTAL KNEE BILATERAL Bilateral 07/27/14    Family History  Problem Relation Age of Onset  . Hypertension Mother   . Stroke Mother    Social History:  reports that she quit smoking about 43 years ago. Her smoking use included cigarettes. She has never used smokeless tobacco. She reports that she does not drink alcohol or use drugs.  Allergies:  Allergies  Allergen Reactions  . Aspirin Other (See Comments)    Unknown reaction  . Sulfa Antibiotics Other (See Comments)    Unknown reaction    (Not in a hospital admission)   Results for orders placed or performed during the hospital encounter  of 08/07/18 (from the past 48 hour(s))  Urinalysis, Routine w reflex microscopic     Status: Abnormal   Collection Time: 08/07/18  9:06 AM  Result Value Ref Range   Color, Urine YELLOW (A) YELLOW   APPearance HAZY (A) CLEAR   Specific Gravity, Urine 1.020 1.005 - 1.030   pH 5.0 5.0 - 8.0   Glucose, UA NEGATIVE NEGATIVE mg/dL   Hgb urine dipstick NEGATIVE NEGATIVE   Bilirubin Urine NEGATIVE NEGATIVE   Ketones, ur NEGATIVE NEGATIVE mg/dL   Protein, ur NEGATIVE NEGATIVE mg/dL   Nitrite NEGATIVE NEGATIVE   Leukocytes,Ua MODERATE (A) NEGATIVE   RBC / HPF 0-5 0 - 5 RBC/hpf   WBC, UA 21-50 0 - 5 WBC/hpf   Bacteria, UA NONE SEEN NONE SEEN   Squamous Epithelial / LPF 0-5 0 - 5   Mucus PRESENT    Hyaline Casts, UA PRESENT     Comment: Performed at [facility], [address] [place], [place] [address]   No results found.  Review of Systems  Constitutional: Negative.   HENT: Negative.   Eyes: Negative.   Respiratory: Negative.   Cardiovascular: Negative.   Gastrointestinal: Negative.   Musculoskeletal: Positive for back pain.  Skin: Negative.   Neurological: Negative.   Psychiatric/Behavioral: Positive for depression and memory loss. Negative for hallucinations, substance abuse and suicidal ideas. The patient is nervous/anxious. The patient does  not have insomnia.     Blood pressure (!) 153/68, pulse 92, temperature 99.8 F (37.7 C), temperature source Oral, resp. rate 16, SpO2 98 %. Physical Exam  Nursing note and vitals reviewed. Constitutional: She appears well-developed and well-nourished.  HENT:  Head: Normocephalic and atraumatic.  Eyes: Pupils are equal, round, and reactive to light. Conjunctivae are normal.  Neck: Normal range of motion.  Cardiovascular: Regular rhythm and normal heart sounds.  Respiratory: Effort normal.  GI: Soft.  Musculoskeletal: Normal range of motion.  Neurological: She is alert.  Skin: Skin is warm and dry.  Psychiatric: Judgment  normal. Her affect is blunt. Her speech is delayed. She is slowed. She expresses no homicidal and no suicidal ideation. She exhibits abnormal recent memory.     Assessment/Plan Continue ECT plan which has been quite helpful in maintaining stability.  We did a UA today and while that is very borderline she is having some symptoms so I have put in an order for Keflex to her outpatient pharmacy.  We will let her husband know about this on discharge.  See her back in another 2 to 3 weeks  Mordecai Rasmussen, MD 08/07/2018, 9:58 AM

## 2018-08-07 NOTE — Anesthesia Postprocedure Evaluation (Signed)
Anesthesia Post Note  Patient: Jenna Dudley  Procedure(s) Performed: ECT TX  Patient location during evaluation: PACU Anesthesia Type: General Level of consciousness: awake and alert and oriented Pain management: pain level controlled Vital Signs Assessment: post-procedure vital signs reviewed and stable Respiratory status: spontaneous breathing Cardiovascular status: blood pressure returned to baseline Anesthetic complications: no     Last Vitals:  Vitals:   08/07/18 1152 08/07/18 1205  BP: 129/63   Pulse: 85 85  Resp: 20 18  Temp: 37.1 C 37.1 C  SpO2: 93%     Last Pain:  Vitals:   08/07/18 1205  TempSrc: Oral  PainSc: 0-No pain                 Aric Jost

## 2018-08-07 NOTE — Anesthesia Post-op Follow-up Note (Signed)
Anesthesia QCDR form completed.        

## 2018-08-21 ENCOUNTER — Telehealth: Payer: Self-pay

## 2018-08-22 ENCOUNTER — Other Ambulatory Visit: Payer: Self-pay | Admitting: Psychiatry

## 2018-08-23 ENCOUNTER — Ambulatory Visit
Admission: RE | Admit: 2018-08-23 | Discharge: 2018-08-23 | Disposition: A | Discharge status: Home / Self Care | Payer: Medicare Other | Source: Ambulatory Visit | Attending: Psychiatry | Admitting: Psychiatry

## 2018-08-23 ENCOUNTER — Encounter: Payer: Self-pay | Admitting: Anesthesiology

## 2018-08-23 ENCOUNTER — Other Ambulatory Visit: Payer: Self-pay

## 2018-08-23 DIAGNOSIS — I5032 Chronic diastolic (congestive) heart failure: Secondary | ICD-10-CM | POA: Diagnosis not present

## 2018-08-23 DIAGNOSIS — M47816 Spondylosis without myelopathy or radiculopathy, lumbar region: Secondary | ICD-10-CM | POA: Diagnosis not present

## 2018-08-23 DIAGNOSIS — Z87891 Personal history of nicotine dependence: Secondary | ICD-10-CM | POA: Diagnosis not present

## 2018-08-23 DIAGNOSIS — F319 Bipolar disorder, unspecified: Secondary | ICD-10-CM | POA: Diagnosis not present

## 2018-08-23 DIAGNOSIS — Z886 Allergy status to analgesic agent status: Secondary | ICD-10-CM | POA: Diagnosis not present

## 2018-08-23 DIAGNOSIS — I110 Hypertensive heart disease with heart failure: Secondary | ICD-10-CM | POA: Diagnosis not present

## 2018-08-23 DIAGNOSIS — F333 Major depressive disorder, recurrent, severe with psychotic symptoms: Secondary | ICD-10-CM

## 2018-08-23 DIAGNOSIS — Z882 Allergy status to sulfonamides status: Secondary | ICD-10-CM | POA: Diagnosis not present

## 2018-08-23 DIAGNOSIS — F0390 Unspecified dementia without behavioral disturbance: Secondary | ICD-10-CM | POA: Diagnosis not present

## 2018-08-23 DIAGNOSIS — Z8249 Family history of ischemic heart disease and other diseases of the circulatory system: Secondary | ICD-10-CM | POA: Insufficient documentation

## 2018-08-23 DIAGNOSIS — G8929 Other chronic pain: Secondary | ICD-10-CM | POA: Diagnosis not present

## 2018-08-23 MED ORDER — SODIUM CHLORIDE 0.9 % IV SOLN
500.0000 mL | Freq: Once | INTRAVENOUS | Status: AC
  Administered 2018-08-23: 500 mL via INTRAVENOUS

## 2018-08-23 MED ORDER — METHOHEXITAL SODIUM 100 MG/10ML IV SOSY
PREFILLED_SYRINGE | INTRAVENOUS | Status: DC | PRN
  Administered 2018-08-23: 60 mg via INTRAVENOUS

## 2018-08-23 MED ORDER — SUCCINYLCHOLINE CHLORIDE 20 MG/ML IJ SOLN
INTRAMUSCULAR | Status: AC
  Filled 2018-08-23: qty 1

## 2018-08-23 MED ORDER — LABETALOL HCL 5 MG/ML IV SOLN
INTRAVENOUS | Status: AC
  Filled 2018-08-23: qty 4

## 2018-08-23 MED ORDER — METHOHEXITAL SODIUM 0.5 G IJ SOLR
INTRAMUSCULAR | Status: AC
  Filled 2018-08-23: qty 500

## 2018-08-23 MED ORDER — SUCCINYLCHOLINE CHLORIDE 20 MG/ML IJ SOLN
INTRAMUSCULAR | Status: DC | PRN
  Administered 2018-08-23: 80 mg via INTRAVENOUS

## 2018-08-23 MED ORDER — LABETALOL HCL 5 MG/ML IV SOLN
INTRAVENOUS | Status: DC | PRN
  Administered 2018-08-23: 20 mg via INTRAVENOUS

## 2018-08-23 NOTE — Anesthesia Post-op Follow-up Note (Signed)
Anesthesia QCDR form completed.        

## 2018-08-23 NOTE — Anesthesia Preprocedure Evaluation (Signed)
Anesthesia Evaluation  Patient identified by MRN, date of birth, ID band Patient awake    Reviewed: Allergy & Precautions, H&P , NPO status , reviewed documented beta blocker date and time   Airway Mallampati: III  TM Distance: >3 FB Neck ROM: full    Dental  (+) Poor Dentition, Missing   Pulmonary pneumonia, former smoker,           Cardiovascular hypertension, +CHF       Neuro/Psych PSYCHIATRIC DISORDERS Depression Bipolar Disorder Dementia  Neuromuscular disease    GI/Hepatic GERD  Controlled,  Endo/Other    Renal/GU      Musculoskeletal  (+) Arthritis ,   Abdominal   Peds  Hematology   Anesthesia Other Findings Past Medical History: 07/27/14: Bilateral carpal tunnel syndrome No date: Bipolar disorder (HCC)     Comment:  geropsych admission to [facility] in Dec 2018 No date: Chronic diastolic CHF (congestive heart failure) (HCC)     Comment:  a. 03/2017 Echo: EF 55-60%, no rwma, Gr1 DD, mild MR. 07/29/14: Chronic low back pain 07/27/14: Degenerative arthritis of lumbar spine No date: Dementia (HCC) No date: Depression 07/29/14: Esophageal spasm No date: GERD (gastroesophageal reflux disease) No date: History of stress test     Comment:  a. 04/2017 MV: low risk stress test.  EF 55-65%. Probable              apical ant/apical, basal, and mid inflat attenuation               artifact vs ischemia/scar. 07/29/14: Hyperlipemia No date: Hypertension No date: Pneumonia 07/29/14: Spinal stenosis  Past Surgical History: No date: HIP SURGERY; Right No date: INNER EAR SURGERY; Right 07/27/14: REPLACEMENT TOTAL KNEE BILATERAL; Bilateral     Reproductive/Obstetrics                             Anesthesia Physical Anesthesia Plan  ASA: III  Anesthesia Plan: General   Post-op Pain Management:    Induction: Intravenous  PONV Risk Score and Plan: Treatment may vary due to age or medical condition  and TIVA  Airway Management Planned: Nasal Cannula and Natural Airway  Additional Equipment:   Intra-op Plan:   Post-operative Plan:   Informed Consent: I have reviewed the patients History and Physical, chart, labs and discussed the procedure including the risks, benefits and alternatives for the proposed anesthesia with the patient or authorized representative who has indicated his/her understanding and acceptance.     Dental Advisory Given  Plan Discussed with: CRNA  Anesthesia Plan Comments:         Anesthesia Quick Evaluation

## 2018-08-23 NOTE — H&P (Signed)
Jenna Dudley is an 83 y.o. female.   Chief Complaint: No specific complaint.  Mood seems to be good. HPI: History of recurrent severe depression currently seems to be doing well no real signs of severe illness at this point  Past Medical History:  Diagnosis Date  . Bilateral carpal tunnel syndrome 07/27/14  . Bipolar disorder [facility])    geropsych admission to [facility] in Dec 2018  . Chronic diastolic CHF (congestive heart failure) (HCC)    a. 03/2017 Echo: EF 55-60%, no rwma, Gr1 DD, mild MR.  . Chronic low back pain 07/29/14  . Degenerative arthritis of lumbar spine 07/27/14  . Dementia (HCC)   . Depression   . Esophageal spasm 07/29/14  . GERD (gastroesophageal reflux disease)   . History of stress test    a. 04/2017 MV: low risk stress test.  EF 55-65%. Probable apical ant/apical, basal, and mid inflat attenuation artifact vs ischemia/scar.  . Hyperlipemia 07/29/14  . Hypertension   . Pneumonia   . Spinal stenosis 07/29/14    Past Surgical History:  Procedure Laterality Date  . HIP SURGERY Right   . INNER EAR SURGERY Right   . REPLACEMENT TOTAL KNEE BILATERAL Bilateral 07/27/14    Family History  Problem Relation Age of Onset  . Hypertension Mother   . Stroke Mother    Social History:  reports that she quit smoking about 43 years ago. Her smoking use included cigarettes. She has never used smokeless tobacco. She reports that she does not drink alcohol or use drugs.  Allergies:  Allergies  Allergen Reactions  . Aspirin Other (See Comments)    Unknown reaction  . Sulfa Antibiotics Other (See Comments)    Unknown reaction    (Not in a hospital admission)   No results found for this or any previous visit (from the past 48 hour(s)). No results found.  Review of Systems  Constitutional: Negative.   HENT: Negative.   Eyes: Negative.   Respiratory: Negative.   Cardiovascular: Negative.   Gastrointestinal: Negative.   Musculoskeletal: Negative.   Skin: Negative.    Neurological: Negative.   Psychiatric/Behavioral: Negative.     Blood pressure (!) 146/59, pulse 87, temperature 98.7 F (37.1 C), temperature source Oral, resp. rate 18, SpO2 98 %. Physical Exam  Nursing note and vitals reviewed. Constitutional: She appears well-developed and well-nourished.  HENT:  Head: Normocephalic and atraumatic.  Eyes: Pupils are equal, round, and reactive to light. Conjunctivae are normal.  Neck: Normal range of motion.  Cardiovascular: Regular rhythm and normal heart sounds.  Respiratory: Effort normal.  GI: Soft.  Musculoskeletal: Normal range of motion.  Neurological: She is alert.  Skin: Skin is warm and dry.  Psychiatric: She has a normal mood and affect. Her behavior is normal. Judgment and thought content normal.     Assessment/Plan Treatment today with next treatment in 2 weeks if possible  Mordecai Rasmussen, MD 08/23/2018, 11:40 AM

## 2018-08-23 NOTE — Procedures (Signed)
ECT SERVICES Physician's Interval Evaluation & Treatment Note  Patient Identification: Jenna Dudley MRN:  [id number] Date of Evaluation:  08/23/2018 TX #: [id number]  MADRS:   MMSE:   P.E. Findings:  No change to physical exam.  Heart and lungs normal vitals unremarkable.  Psychiatric Interval Note:  Mood is better it is more at her baseline  Subjective:  Patient is a 83 y.o. female seen for evaluation for Electroconvulsive Therapy. No complaints  Treatment Summary:   [x]   Right Unilateral             []  Bilateral   % Energy : 0.3 ms 90%   Impedance: 1520 ohms  Seizure Energy Index: 3058 V squared  Postictal Suppression Index: 69%  Seizure Concordance Index: 74%  Medications  Pre Shock: Labetalol 20 mg Brevital 60 mg succinylcholine 80 mg  Post Shock:    Seizure Duration: 16 seconds EMG 47 seconds EEG   Comments: Follow-up 2 weeks  Lungs:  [x]   Clear to auscultation               []  Other:   Heart:    [x]   Regular rhythm             []  irregular rhythm    [x]   Previous H&P reviewed, patient examined and there are NO CHANGES                 []   Previous H&P reviewed, patient examined and there are changes noted.   Mordecai Rasmussen, MD 5/29/202011:43 AM

## 2018-08-23 NOTE — Anesthesia Postprocedure Evaluation (Signed)
Anesthesia Post Note  Patient: Jenna Dudley  Procedure(s) Performed: ECT TX  Patient location during evaluation: PACU Anesthesia Type: General Level of consciousness: awake and alert Pain management: pain level controlled Vital Signs Assessment: post-procedure vital signs reviewed and stable Respiratory status: spontaneous breathing, nonlabored ventilation and respiratory function stable Cardiovascular status: blood pressure returned to baseline and stable Postop Assessment: no apparent nausea or vomiting Anesthetic complications: no     Last Vitals:  Vitals:   08/23/18 1231 08/23/18 1236  BP: 132/72 132/72  Pulse: 92 92  Resp: (!) 32 (!) 24  Temp:  (!) 36.2 C  SpO2: 93%     Last Pain:  Vitals:   08/23/18 1236  TempSrc: Oral  PainSc: 0-No pain                 Christia Reading

## 2018-08-23 NOTE — Discharge Instructions (Signed)
1)  The drugs that you have been given will stay in your system until tomorrow so for the       next 24 hours you should not:  A. Drive an automobile  B. Make any legal decisions  C. Drink any alcoholic beverages  2)  You may resume your regular meals upon return home.  3)  A responsible adult must take you home.  Someone should stay with you for a few          hours, then be available by phone for the remainder of the treatment day.  4)  You May experience any of the following symptoms:  Headache, Nausea and a dry mouth (due to the medications you were given),  temporary memory loss and some confusion, or sore muscles (a warm bath  should help this).  If you you experience any of these symptoms let us know on                your return visit.  5)  Report any of the following: any acute discomfort, severe headache, or temperature        greater than 100.5 F.   Also report any unusual redness, swelling, drainage, or pain         at your IV site.    You may report Symptoms to:  ECT PROGRAM- [place] at [facility]          Phone: [phone], ECT Department           or Dr. Shary Key office [phone]  6)  Your next ECT Treatment is Friday June 12   We will call 2 days prior to your scheduled appointment for arrival times.  7)  Nothing to eat or drink after midnight the night before your procedure.  8)  Take  With a sip of water the morning of your procedure.  9)  Other Instructions: Call [phone] to cancel the morning of your procedure due         to illness or emergency.  10) We will call within 72 hours to assess how you are feeling.

## 2018-08-23 NOTE — Transfer of Care (Signed)
Immediate Anesthesia Transfer of Care Note  Patient: Jenna Dudley  Procedure(s) Performed: ECT TX  Patient Location: PACU  Anesthesia Type:General  Level of Consciousness: sedated  Airway & Oxygen Therapy: Patient Spontanous Breathing and Patient connected to face mask oxygen  Post-op Assessment: Report given to RN and Post -op Vital signs reviewed and stable  Post vital signs: Reviewed and stable  Last Vitals:  Vitals Value Taken Time  BP 142/59 08/23/2018 12:01 PM  Temp 36.2 C 08/23/2018 12:01 PM  Pulse 92 08/23/2018 12:02 PM  Resp 22 08/23/2018 12:02 PM  SpO2 99 % 08/23/2018 12:02 PM  Vitals shown include unvalidated device data.  Last Pain:  Vitals:   08/23/18 1201  TempSrc:   PainSc: 0-No pain         Complications: No apparent anesthesia complications

## 2018-09-02 ENCOUNTER — Telehealth: Payer: Self-pay | Admitting: *Deleted

## 2018-09-04 ENCOUNTER — Telehealth: Payer: Self-pay

## 2018-09-06 ENCOUNTER — Other Ambulatory Visit: Payer: Self-pay

## 2018-09-06 ENCOUNTER — Other Ambulatory Visit
Admission: RE | Admit: 2018-09-06 | Discharge: 2018-09-06 | Disposition: A | Discharge status: Home / Self Care | Payer: Medicare Other | Source: Ambulatory Visit | Attending: Psychiatry | Admitting: Psychiatry

## 2018-09-06 ENCOUNTER — Other Ambulatory Visit: Payer: Self-pay | Admitting: Psychiatry

## 2018-09-06 DIAGNOSIS — Z01812 Encounter for preprocedural laboratory examination: Secondary | ICD-10-CM | POA: Insufficient documentation

## 2018-09-06 DIAGNOSIS — Z1159 Encounter for screening for other viral diseases: Secondary | ICD-10-CM | POA: Diagnosis not present

## 2018-09-07 LAB — NOVEL CORONAVIRUS, NAA (HOSP ORDER, SEND-OUT TO REF LAB; TAT 18-24 HRS): SARS-CoV-2, NAA: NOT DETECTED

## 2018-09-09 ENCOUNTER — Encounter: Payer: Self-pay | Admitting: Anesthesiology

## 2018-09-09 ENCOUNTER — Ambulatory Visit
Admission: RE | Admit: 2018-09-09 | Discharge: 2018-09-09 | Disposition: A | Discharge status: Home / Self Care | Payer: Medicare Other | Source: Ambulatory Visit | Attending: Psychiatry | Admitting: Psychiatry

## 2018-09-09 ENCOUNTER — Other Ambulatory Visit: Payer: Self-pay

## 2018-09-09 DIAGNOSIS — Z8249 Family history of ischemic heart disease and other diseases of the circulatory system: Secondary | ICD-10-CM | POA: Insufficient documentation

## 2018-09-09 DIAGNOSIS — E785 Hyperlipidemia, unspecified: Secondary | ICD-10-CM | POA: Diagnosis not present

## 2018-09-09 DIAGNOSIS — I110 Hypertensive heart disease with heart failure: Secondary | ICD-10-CM | POA: Insufficient documentation

## 2018-09-09 DIAGNOSIS — I5032 Chronic diastolic (congestive) heart failure: Secondary | ICD-10-CM | POA: Insufficient documentation

## 2018-09-09 DIAGNOSIS — Z886 Allergy status to analgesic agent status: Secondary | ICD-10-CM | POA: Diagnosis not present

## 2018-09-09 DIAGNOSIS — M1990 Unspecified osteoarthritis, unspecified site: Secondary | ICD-10-CM | POA: Insufficient documentation

## 2018-09-09 DIAGNOSIS — K219 Gastro-esophageal reflux disease without esophagitis: Secondary | ICD-10-CM | POA: Insufficient documentation

## 2018-09-09 DIAGNOSIS — Z79899 Other long term (current) drug therapy: Secondary | ICD-10-CM | POA: Diagnosis not present

## 2018-09-09 DIAGNOSIS — F0390 Unspecified dementia without behavioral disturbance: Secondary | ICD-10-CM | POA: Insufficient documentation

## 2018-09-09 DIAGNOSIS — Z87891 Personal history of nicotine dependence: Secondary | ICD-10-CM | POA: Diagnosis not present

## 2018-09-09 DIAGNOSIS — F329 Major depressive disorder, single episode, unspecified: Secondary | ICD-10-CM | POA: Insufficient documentation

## 2018-09-09 DIAGNOSIS — F332 Major depressive disorder, 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 features: Secondary | ICD-10-CM | POA: Diagnosis not present

## 2018-09-09 DIAGNOSIS — Z823 Family history of stroke: Secondary | ICD-10-CM | POA: Insufficient documentation

## 2018-09-09 DIAGNOSIS — Z8701 Personal history of pneumonia (recurrent): Secondary | ICD-10-CM | POA: Diagnosis not present

## 2018-09-09 DIAGNOSIS — Z882 Allergy status to sulfonamides status: Secondary | ICD-10-CM | POA: Insufficient documentation

## 2018-09-09 MED ORDER — SODIUM CHLORIDE 0.9 % IV SOLN
500.0000 mL | Freq: Once | INTRAVENOUS | Status: AC
  Administered 2018-09-09: 500 mL via INTRAVENOUS

## 2018-09-09 MED ORDER — SUCCINYLCHOLINE CHLORIDE 200 MG/10ML IV SOSY
PREFILLED_SYRINGE | INTRAVENOUS | Status: DC | PRN
  Administered 2018-09-09: 80 mg via INTRAVENOUS

## 2018-09-09 MED ORDER — LABETALOL HCL 5 MG/ML IV SOLN
INTRAVENOUS | Status: DC | PRN
  Administered 2018-09-09: 20 mg via INTRAVENOUS

## 2018-09-09 MED ORDER — ONDANSETRON HCL 4 MG/2ML IJ SOLN: 4.0000 mg | Freq: Once | INTRAMUSCULAR | Status: DC | PRN

## 2018-09-09 MED ORDER — LABETALOL HCL 5 MG/ML IV SOLN
INTRAVENOUS | Status: AC
  Filled 2018-09-09: qty 4

## 2018-09-09 MED ORDER — FENTANYL CITRATE (PF) 100 MCG/2ML IJ SOLN: 25.0000 ug | INTRAMUSCULAR | Status: DC | PRN

## 2018-09-09 MED ORDER — METHOHEXITAL SODIUM 100 MG/10ML IV SOSY
PREFILLED_SYRINGE | INTRAVENOUS | Status: DC | PRN
  Administered 2018-09-09: 60 mg via INTRAVENOUS

## 2018-09-09 MED ORDER — SODIUM CHLORIDE 0.9 % IV SOLN
INTRAVENOUS | Status: DC | PRN
  Administered 2018-09-09: 11:00:00 via INTRAVENOUS

## 2018-09-09 MED ORDER — KETOROLAC TROMETHAMINE 30 MG/ML IJ SOLN: 30.0000 mg | Freq: Once | INTRAMUSCULAR | Status: DC

## 2018-09-09 MED ORDER — SUCCINYLCHOLINE CHLORIDE 20 MG/ML IJ SOLN
INTRAMUSCULAR | Status: AC
  Filled 2018-09-09: qty 1

## 2018-09-09 NOTE — Anesthesia Post-op Follow-up Note (Signed)
Anesthesia QCDR form completed.        

## 2018-09-09 NOTE — Anesthesia Procedure Notes (Signed)
Date/Time: 09/09/2018 11:10 AM Performed by: Dionne Bucy, CRNA Pre-anesthesia Checklist: Patient identified, Emergency Drugs available, Suction available and Patient being monitored Patient Re-evaluated:Patient Re-evaluated prior to induction Oxygen Delivery Method: Circle system utilized Preoxygenation: Pre-oxygenation with 100% oxygen Induction Type: IV induction Ventilation: Mask ventilation without difficulty and Mask ventilation throughout procedure Airway Equipment and Method: Bite block Placement Confirmation: positive ETCO2 Dental Injury: Teeth and Oropharynx as per pre-operative assessment

## 2018-09-09 NOTE — Transfer of Care (Signed)
Immediate Anesthesia Transfer of Care Note  Patient: MEILIN BROSH  Procedure(s) Performed: ECT TX  Patient Location: PACU  Anesthesia Type:General  Level of Consciousness: sedated  Airway & Oxygen Therapy: Patient Spontanous Breathing and Patient connected to face mask oxygen  Post-op Assessment: Report given to RN and Post -op Vital signs reviewed and stable  Post vital signs: Reviewed and stable  Last Vitals:  Vitals Value Taken Time  BP 139/64 09/09/18 1121  Temp 36.4 C 09/09/18 1121  Pulse 79 09/09/18 1123  Resp 25 09/09/18 1123  SpO2 99 % 09/09/18 1123  Vitals shown include unvalidated device data.  Last Pain:  Vitals:   09/09/18 0946  TempSrc:   PainSc: 0-No pain         Complications: No apparent anesthesia complications

## 2018-09-09 NOTE — Anesthesia Postprocedure Evaluation (Signed)
Anesthesia Post Note  Patient: Jenna Dudley  Procedure(s) Performed: ECT TX  Patient location during evaluation: PACU Anesthesia Type: General Level of consciousness: awake and alert Pain management: pain level controlled Vital Signs Assessment: post-procedure vital signs reviewed and stable Respiratory status: spontaneous breathing, nonlabored ventilation and respiratory function stable Cardiovascular status: blood pressure returned to baseline and stable Postop Assessment: no signs of nausea or vomiting Anesthetic complications: no     Last Vitals:  Vitals:   09/09/18 1153 09/09/18 1157  BP: (!) 119/47 132/65  Pulse: 78 82  Resp: (!) 21 18  Temp: (!) 36.2 C   SpO2: 96%     Last Pain:  Vitals:   09/09/18 1157  TempSrc:   PainSc: 0-No pain                 Jhamal Plucinski

## 2018-09-09 NOTE — Discharge Instructions (Signed)
1)  The drugs that you have been given will stay in your system until tomorrow so for the       next 24 hours you should not:  A. Drive an automobile  B. Make any legal decisions  C. Drink any alcoholic beverages  2)  You may resume your regular meals upon return home.  3)  A responsible adult must take you home.  Someone should stay with you for a few          hours, then be available by phone for the remainder of the treatment day.  4)  You May experience any of the following symptoms:  Headache, Nausea and a dry mouth (due to the medications you were given),  temporary memory loss and some confusion, or sore muscles (a warm bath  should help this).  If you you experience any of these symptoms let us know on                your return visit.  5)  Report any of the following: any acute discomfort, severe headache, or temperature        greater than 100.5 F.   Also report any unusual redness, swelling, drainage, or pain         at your IV site.    You may report Symptoms to:  [place] [facility]          Phone: [phone], ECT Department           or Dr. Prescott Gum office [phone]  6)  Your next ECT Treatment is Monday July 6  We will call 2 days prior to your scheduled appointment for arrival times.  7)  Nothing to eat or drink after midnight the night before your procedure.  8)  Take     With a sip of water the morning of your procedure.  9)  Other Instructions: Call [phone] to cancel the morning of your procedure due         to illness or emergency.  10) We will call within 72 hours to assess how you are feeling.

## 2018-09-09 NOTE — H&P (Signed)
Jenna Dudley is an 83 y.o. female.   Chief Complaint: Feeling good. No return of depression of anxiety HPI: Doing well on schedule. Will try to keep her on a tighter schedule like this for now  Past Medical History:  Diagnosis Date  . Bilateral carpal tunnel syndrome 07/27/14  . Bipolar disorder [facility])    geropsych admission to [facility] in Dec 2018  . Chronic diastolic CHF (congestive heart failure) ([place])    a. 03/2017 Echo: EF 55-60%, no rwma, Gr1 DD, mild MR.  . Chronic low back pain 07/29/14  . Degenerative arthritis of lumbar spine 07/27/14  . Dementia ([place])   . Depression   . Esophageal spasm 07/29/14  . GERD (gastroesophageal reflux disease)   . History of stress test    a. 04/2017 MV: low risk stress test.  EF 55-65%. Probable apical ant/apical, basal, and mid inflat attenuation artifact vs ischemia/scar.  . Hyperlipemia 07/29/14  . Hypertension   . Pneumonia   . Spinal stenosis 07/29/14    Past Surgical History:  Procedure Laterality Date  . HIP SURGERY Right   . INNER EAR SURGERY Right   . REPLACEMENT TOTAL KNEE BILATERAL Bilateral 07/27/14    Family History  Problem Relation Age of Onset  . Hypertension Mother   . Stroke Mother    Social History:  reports that she quit smoking about 43 years ago. Her smoking use included cigarettes. She has never used smokeless tobacco. She reports that she does not drink alcohol or use drugs.  Allergies:  Allergies  Allergen Reactions  . Aspirin Other (See Comments)    Unknown reaction  . Sulfa Antibiotics Other (See Comments)    Unknown reaction    (Not in a hospital admission)   No results found for this or any previous visit (from the past 48 hour(s)). No results found.  Review of Systems  Constitutional: Negative.   HENT: Negative.   Eyes: Negative.   Respiratory: Negative.   Cardiovascular: Negative.   Gastrointestinal: Negative.   Musculoskeletal: Positive for back pain.  Skin: Negative.   Neurological: Negative.    Psychiatric/Behavioral: Negative for depression, hallucinations, memory loss, substance abuse and suicidal ideas. The patient is not nervous/anxious and does not have insomnia.     Blood pressure (!) 144/62, pulse 86, temperature 97.9 F (36.6 C), temperature source Oral, resp. rate 18, height 5' (1.524 m), weight 72.1 kg. Physical Exam  Nursing note and vitals reviewed. Constitutional: She appears well-developed and well-nourished.  HENT:  Head: Normocephalic and atraumatic.  Eyes: Pupils are equal, round, and reactive to light. Conjunctivae are normal.  Neck: Normal range of motion.  Cardiovascular: Regular rhythm and normal heart sounds.  Respiratory: Effort normal.  GI: Soft.  Musculoskeletal: Normal range of motion.  Neurological: She is alert.  Skin: Skin is warm and dry.  Psychiatric: She has a normal mood and affect. Her speech is normal and behavior is normal. Judgment and thought content normal. Cognition and memory are impaired.     Assessment/Plan See above. Does better on q2-3 week schedule  Alethia Berthold, MD 09/09/2018, 10:02 AM

## 2018-09-09 NOTE — Anesthesia Preprocedure Evaluation (Signed)
Anesthesia Evaluation  Patient identified by MRN, date of birth, ID band Patient awake    Reviewed: Allergy & Precautions, H&P , NPO status , Patient's Chart, lab work & pertinent test results, reviewed documented beta blocker date and time   Airway Mallampati: II   Neck ROM: full    Dental  (+) Poor Dentition   Pulmonary neg pulmonary ROS, shortness of breath and with exertion, pneumonia, resolved, former smoker,  Baseline SOB with exertion but comfortable at rest consistent were her baseline status.  JA   Pulmonary exam normal        Cardiovascular Exercise Tolerance: Poor hypertension, On Medications +CHF  negative cardio ROS Normal cardiovascular exam Rhythm:regular Rate:Normal     Neuro/Psych PSYCHIATRIC DISORDERS Depression Bipolar Disorder Dementia  Neuromuscular disease negative neurological ROS  negative psych ROS   GI/Hepatic negative GI ROS, Neg liver ROS, GERD  ,  Endo/Other  negative endocrine ROS  Renal/GU negative Renal ROS  negative genitourinary   Musculoskeletal  (+) Arthritis ,   Abdominal   Peds  Hematology negative hematology ROS (+)   Anesthesia Other Findings Past Medical History: 07/27/14: Bilateral carpal tunnel syndrome No date: Bipolar disorder ([place])     Comment:  geropsych admission to [facility] in Dec 2018 No date: Chronic diastolic CHF (congestive heart failure) ([place])     Comment:  a. 03/2017 Echo: EF 55-60%, no rwma, Gr1 DD, mild MR. 07/29/14: Chronic low back pain 07/27/14: Degenerative arthritis of lumbar spine No date: Dementia ([place]) No date: Depression 07/29/14: Esophageal spasm No date: GERD (gastroesophageal reflux disease) No date: History of stress test     Comment:  a. 04/2017 MV: low risk stress test.  EF 55-65%. Probable              apical ant/apical, basal, and mid inflat attenuation               artifact vs ischemia/scar. 07/29/14: Hyperlipemia No date: Hypertension No  date: Pneumonia 07/29/14: Spinal stenosis Past Surgical History: No date: HIP SURGERY; Right No date: INNER EAR SURGERY; Right 07/27/14: REPLACEMENT TOTAL KNEE BILATERAL; Bilateral   Reproductive/Obstetrics negative OB ROS                             Anesthesia Physical Anesthesia Plan  ASA: III  Anesthesia Plan: General   Post-op Pain Management:    Induction:   PONV Risk Score and Plan:   Airway Management Planned:   Additional Equipment:   Intra-op Plan:   Post-operative Plan:   Informed Consent: I have reviewed the patients History and Physical, chart, labs and discussed the procedure including the risks, benefits and alternatives for the proposed anesthesia with the patient or authorized representative who has indicated his/her understanding and acceptance.     Dental Advisory Given  Plan Discussed with: CRNA  Anesthesia Plan Comments:         Anesthesia Quick Evaluation

## 2018-09-09 NOTE — Procedures (Signed)
ECT SERVICES Physician's Interval Evaluation & Treatment Note  Patient Identification: Jenna Dudley MRN:  [id number] Date of Evaluation:  09/09/2018 TX #: 25  MADRS:   MMSE:   P.E. Findings:  No change to physical exam  Psychiatric Interval Note:  No complaints.  Seems to be in good shape even better than last time we saw her.  Subjective:  Patient is a 83 y.o. female seen for evaluation for Electroconvulsive Therapy. No new complaints.  Not talking any of her depressing talk  Treatment Summary:   [x]   Right Unilateral             []  Bilateral   % Energy : 0.3 ms 90%   Impedance: 2070 ohms  Seizure Energy Index: 131,494 but I think that is probably artifactual  Postictal Suppression Index: 24%  Seizure Concordance Index: 35%  Medications  Pre Shock: Labetalol 20 mg Brevital 60 mg succinylcholine 80 mg  Post Shock:    Seizure Duration: 18 seconds EMG 37 seconds EEG   Comments: The parameters above are artifactual because 1 of the leads had interference and did not lead correctly but I think she clearly had a affective seizure.  Follow-up 2 weeks  Lungs:  [x]   Clear to auscultation               []  Other:   Heart:    [x]   Regular rhythm             []  irregular rhythm    [x]   Previous H&P reviewed, patient examined and there are NO CHANGES                 []   Previous H&P reviewed, patient examined and there are changes noted.   Alethia Berthold, MD 6/15/20205:39 PM

## 2018-09-10 ENCOUNTER — Other Ambulatory Visit: Payer: Self-pay | Admitting: Psychiatry

## 2018-09-19 ENCOUNTER — Other Ambulatory Visit: Payer: Self-pay | Admitting: Cardiovascular Disease

## 2018-09-25 ENCOUNTER — Telehealth: Payer: Self-pay | Admitting: *Deleted

## 2018-09-27 ENCOUNTER — Other Ambulatory Visit: Payer: Self-pay | Admitting: Psychiatry

## 2018-09-30 ENCOUNTER — Encounter: Payer: Self-pay | Admitting: Anesthesiology

## 2018-09-30 ENCOUNTER — Encounter
Admission: RE | Admit: 2018-09-30 | Discharge: 2018-09-30 | Disposition: A | Discharge status: Home / Self Care | Payer: Medicare Other | Source: Ambulatory Visit | Attending: Psychiatry | Admitting: Psychiatry

## 2018-09-30 ENCOUNTER — Ambulatory Visit: Payer: Self-pay | Admitting: Anesthesiology

## 2018-09-30 ENCOUNTER — Other Ambulatory Visit: Payer: Self-pay

## 2018-09-30 ENCOUNTER — Other Ambulatory Visit
Admission: RE | Admit: 2018-09-30 | Discharge: 2018-09-30 | Disposition: A | Discharge status: Home / Self Care | Payer: Medicare Other | Source: Ambulatory Visit | Attending: Psychiatry | Admitting: Psychiatry

## 2018-09-30 DIAGNOSIS — Z87891 Personal history of nicotine dependence: Secondary | ICD-10-CM | POA: Insufficient documentation

## 2018-09-30 DIAGNOSIS — I5032 Chronic diastolic (congestive) heart failure: Secondary | ICD-10-CM | POA: Diagnosis not present

## 2018-09-30 DIAGNOSIS — F419 Anxiety disorder, unspecified: Secondary | ICD-10-CM | POA: Diagnosis not present

## 2018-09-30 DIAGNOSIS — F319 Bipolar disorder, unspecified: Secondary | ICD-10-CM | POA: Diagnosis not present

## 2018-09-30 DIAGNOSIS — F332 Major depressive disorder, 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 features: Secondary | ICD-10-CM

## 2018-09-30 DIAGNOSIS — F0390 Unspecified dementia without behavioral disturbance: Secondary | ICD-10-CM | POA: Insufficient documentation

## 2018-09-30 DIAGNOSIS — M549 Dorsalgia, unspecified: Secondary | ICD-10-CM | POA: Diagnosis not present

## 2018-09-30 DIAGNOSIS — Z01812 Encounter for preprocedural laboratory examination: Secondary | ICD-10-CM | POA: Insufficient documentation

## 2018-09-30 DIAGNOSIS — R531 Weakness: Secondary | ICD-10-CM | POA: Insufficient documentation

## 2018-09-30 DIAGNOSIS — R509 Fever, unspecified: Secondary | ICD-10-CM | POA: Insufficient documentation

## 2018-09-30 DIAGNOSIS — Z1159 Encounter for screening for other viral diseases: Secondary | ICD-10-CM | POA: Insufficient documentation

## 2018-09-30 DIAGNOSIS — Z886 Allergy status to analgesic agent status: Secondary | ICD-10-CM | POA: Insufficient documentation

## 2018-09-30 DIAGNOSIS — I110 Hypertensive heart disease with heart failure: Secondary | ICD-10-CM | POA: Diagnosis not present

## 2018-09-30 DIAGNOSIS — Z882 Allergy status to sulfonamides status: Secondary | ICD-10-CM | POA: Diagnosis not present

## 2018-09-30 LAB — SARS CORONAVIRUS 2 BY RT PCR (HOSPITAL ORDER, PERFORMED IN ~~LOC~~ HOSPITAL LAB): SARS Coronavirus 2: NEGATIVE

## 2018-09-30 MED ORDER — LABETALOL HCL 5 MG/ML IV SOLN
INTRAVENOUS | Status: DC | PRN
  Administered 2018-09-30: 20 mg via INTRAVENOUS

## 2018-09-30 MED ORDER — SODIUM CHLORIDE 0.9 % IV SOLN
500.0000 mL | Freq: Once | INTRAVENOUS | Status: AC
  Administered 2018-09-30: 500 mL via INTRAVENOUS

## 2018-09-30 MED ORDER — LABETALOL HCL 5 MG/ML IV SOLN
INTRAVENOUS | Status: AC
  Filled 2018-09-30: qty 4

## 2018-09-30 MED ORDER — METHOHEXITAL SODIUM 100 MG/10ML IV SOSY
PREFILLED_SYRINGE | INTRAVENOUS | Status: DC | PRN
  Administered 2018-09-30: 60 mg via INTRAVENOUS

## 2018-09-30 MED ORDER — ONDANSETRON HCL 4 MG/2ML IJ SOLN: 4.0000 mg | Freq: Once | INTRAMUSCULAR | Status: DC | PRN

## 2018-09-30 MED ORDER — METHOHEXITAL SODIUM 0.5 G IJ SOLR
INTRAMUSCULAR | Status: AC
  Filled 2018-09-30: qty 500

## 2018-09-30 MED ORDER — SUCCINYLCHOLINE CHLORIDE 20 MG/ML IJ SOLN
INTRAMUSCULAR | Status: DC | PRN
  Administered 2018-09-30: 80 mg via INTRAVENOUS

## 2018-09-30 MED ORDER — SUCCINYLCHOLINE CHLORIDE 20 MG/ML IJ SOLN
INTRAMUSCULAR | Status: AC
  Filled 2018-09-30: qty 1

## 2018-09-30 NOTE — Anesthesia Preprocedure Evaluation (Signed)
Anesthesia Evaluation  Patient identified by MRN, date of birth, ID band Patient awake    Reviewed: Allergy & Precautions, H&P , NPO status , Patient's Chart, lab work & pertinent test results, reviewed documented beta blocker date and time   Airway Mallampati: II       Dental  (+) Poor Dentition   Pulmonary neg pulmonary ROS, shortness of breath and with exertion, pneumonia, resolved, former smoker,  Baseline SOB with exertion but comfortable at rest consistent were her baseline status.  JA   Pulmonary exam normal        Cardiovascular Exercise Tolerance: Poor hypertension, On Medications +CHF  negative cardio ROS Normal cardiovascular exam     Neuro/Psych PSYCHIATRIC DISORDERS Depression Bipolar Disorder Dementia  Neuromuscular disease negative neurological ROS  negative psych ROS   GI/Hepatic negative GI ROS, Neg liver ROS, GERD  ,  Endo/Other  negative endocrine ROS  Renal/GU negative Renal ROS  negative genitourinary   Musculoskeletal  (+) Arthritis ,   Abdominal   Peds  Hematology negative hematology ROS (+)   Anesthesia Other Findings   Reproductive/Obstetrics negative OB ROS                             Anesthesia Physical  Anesthesia Plan  ASA: III  Anesthesia Plan: General   Post-op Pain Management:    Induction:   PONV Risk Score and Plan:   Airway Management Planned: Mask  Additional Equipment:   Intra-op Plan:   Post-operative Plan:   Informed Consent: I have reviewed the patients History and Physical, chart, labs and discussed the procedure including the risks, benefits and alternatives for the proposed anesthesia with the patient or authorized representative who has indicated his/her understanding and acceptance.       Plan Discussed with: CRNA  Anesthesia Plan Comments:         Anesthesia Quick Evaluation

## 2018-09-30 NOTE — H&P (Signed)
Jenna Dudley is an 83 y.o. female.   Chief Complaint: No new complaint.  Mood reported as being good.  Chronic back pain no significant change HPI: History of recurrent severe depression with good maintenance with ECT.  Past Medical History:  Diagnosis Date  . Bilateral carpal tunnel syndrome 07/27/14  . Bipolar disorder [facility])    geropsych admission to [facility] in Dec 2018  . Chronic diastolic CHF (congestive heart failure) ([place])    a. 03/2017 Echo: EF 55-60%, no rwma, Gr1 DD, mild MR.  . Chronic low back pain 07/29/14  . Degenerative arthritis of lumbar spine 07/27/14  . Dementia ([place])   . Depression   . Esophageal spasm 07/29/14  . GERD (gastroesophageal reflux disease)   . History of stress test    a. 04/2017 MV: low risk stress test.  EF 55-65%. Probable apical ant/apical, basal, and mid inflat attenuation artifact vs ischemia/scar.  . Hyperlipemia 07/29/14  . Hypertension   . Pneumonia   . Spinal stenosis 07/29/14    Past Surgical History:  Procedure Laterality Date  . HIP SURGERY Right   . INNER EAR SURGERY Right   . REPLACEMENT TOTAL KNEE BILATERAL Bilateral 07/27/14    Family History  Problem Relation Age of Onset  . Hypertension Mother   . Stroke Mother    Social History:  reports that she quit smoking about 43 years ago. Her smoking use included cigarettes. She has never used smokeless tobacco. She reports that she does not drink alcohol or use drugs.  Allergies:  Allergies  Allergen Reactions  . Aspirin Other (See Comments)    Unknown reaction  . Sulfa Antibiotics Other (See Comments)    Unknown reaction    (Not in a hospital admission)   Results for orders placed or performed during the hospital encounter of 09/30/18 (from the past 48 hour(s))  SARS Coronavirus 2 (CEPHEID - Performed in [facility] hospital lab), Hosp Order     Status: None   Collection Time: 09/30/18  7:58 AM   Specimen: Nasopharyngeal Swab  Result Value Ref Range   SARS Coronavirus 2 NEGATIVE  NEGATIVE    Comment: (NOTE) If result is NEGATIVE SARS-CoV-2 target nucleic acids are NOT DETECTED. The SARS-CoV-2 RNA is generally detectable in upper and lower  respiratory specimens during the acute phase of infection. The lowest  concentration of SARS-CoV-2 viral copies this assay can detect is 250  copies / mL. A negative result does not preclude SARS-CoV-2 infection  and should not be used as the sole basis for treatment or other  patient management decisions.  A negative result may occur with  improper specimen collection / handling, submission of specimen other  than nasopharyngeal swab, presence of viral mutation(s) within the  areas targeted by this assay, and inadequate number of viral copies  (<250 copies / mL). A negative result must be combined with clinical  observations, patient history, and epidemiological information. If result is POSITIVE SARS-CoV-2 target nucleic acids are DETECTED. The SARS-CoV-2 RNA is generally detectable in upper and lower  respiratory specimens dur ing the acute phase of infection.  Positive  results are indicative of active infection with SARS-CoV-2.  Clinical  correlation with patient history and other diagnostic information is  necessary to determine patient infection status.  Positive results do  not rule out bacterial infection or co-infection with other viruses. If result is PRESUMPTIVE POSTIVE SARS-CoV-2 nucleic acids MAY BE PRESENT.   A presumptive positive result was obtained on the submitted  specimen  and confirmed on repeat testing.  While 2019 novel coronavirus  (SARS-CoV-2) nucleic acids may be present in the submitted sample  additional confirmatory testing may be necessary for epidemiological  and / or clinical management purposes  to differentiate between  SARS-CoV-2 and other Sarbecovirus currently known to infect humans.  If clinically indicated additional testing with an alternate test  methodology [id number]) is advised. The  SARS-CoV-2 RNA is generally  detectable in upper and lower respiratory sp ecimens during the acute  phase of infection. The expected result is Negative. Fact Sheet for Patients:  BoilerBrush.com.cyhttps://www.fda.gov/media/136312/download Fact Sheet for Healthcare Providers: https://pope.com/https://www.fda.gov/media/136313/download This test is not yet approved or cleared by the [place] FDA and has been authorized for detection and/or diagnosis of SARS-CoV-2 by FDA under an Emergency Use Authorization (EUA).  This EUA will remain in effect (meaning this test can be used) for the duration of the COVID-19 declaration under Section 564(b)(1) of the Act, 21 U.S.C. section 360bbb-3(b)(1), unless the authorization is terminated or revoked sooner. Performed at [facility], [address] [place], [place] [address]    No results found.  Review of Systems  Constitutional: Negative.   HENT: Negative.   Eyes: Negative.   Respiratory: Negative.   Cardiovascular: Negative.   Gastrointestinal: Negative.   Musculoskeletal: Negative.   Skin: Negative.   Neurological: Negative.   Psychiatric/Behavioral: Negative.     Blood pressure (!) 143/72, pulse 87, temperature 98.1 F (36.7 C), temperature source Oral, resp. rate 18, height 5' (1.524 m), weight 73 kg, SpO2 95 %. Physical Exam  Nursing note and vitals reviewed. Constitutional: She appears well-developed and well-nourished.  HENT:  Head: Normocephalic and atraumatic.  Eyes: Pupils are equal, round, and reactive to light. Conjunctivae are normal.  Neck: Normal range of motion.  Cardiovascular: Regular rhythm and normal heart sounds.  Respiratory: Effort normal.  GI: Soft.  Musculoskeletal: Normal range of motion.  Neurological: She is alert.  Skin: Skin is warm and dry.  Psychiatric: She has a normal mood and affect. Her speech is normal and behavior is normal. Judgment normal. Thought content is not paranoid and not delusional. She expresses no  homicidal and no suicidal ideation. She exhibits abnormal recent memory.     Assessment/Plan Patient notably has chronic memory problems which are probably gradually getting worse but at this point the benefit of the ECT appears to outweigh the cost.  We will see her back in 3 weeks  Mordecai RasmussenJohn Clapacs, MD 09/30/2018, 10:10 AM

## 2018-09-30 NOTE — Anesthesia Postprocedure Evaluation (Signed)
Anesthesia Post Note  Patient: Jenna Dudley  Procedure(s) Performed: ECT TX  Patient location during evaluation: PACU Anesthesia Type: General Level of consciousness: sedated Pain management: pain level controlled Vital Signs Assessment: post-procedure vital signs reviewed and stable Respiratory status: spontaneous breathing and respiratory function stable Cardiovascular status: stable Anesthetic complications: no     Last Vitals:  Vitals:   09/30/18 1049 09/30/18 1059  BP: (!) 102/57 111/89  Pulse: 94 93  Resp: 19 17  Temp:  36.6 C  SpO2: 92% 93%    Last Pain:  Vitals:   09/30/18 1059  TempSrc:   PainSc: 0-No pain                 KEPHART,WILLIAM K

## 2018-09-30 NOTE — Transfer of Care (Signed)
Immediate Anesthesia Transfer of Care Note  Patient: Jenna Dudley  Procedure(s) Performed: ECT TX  Patient Location: PACU  Anesthesia Type:General  Level of Consciousness: awake  Airway & Oxygen Therapy: Patient Spontanous Breathing and Patient connected to face mask oxygen  Post-op Assessment: Report given to RN and Post -op Vital signs reviewed and stable  Post vital signs: Reviewed  Last Vitals:  Vitals Value Taken Time  BP 130/57 09/30/18 1029  Temp    Pulse 94 09/30/18 1029  Resp 21 09/30/18 1029  SpO2 100 % 09/30/18 1029  Vitals shown include unvalidated device data.  Last Pain:  Vitals:   09/30/18 0828  TempSrc: Oral  PainSc: 2          Complications: No apparent anesthesia complications

## 2018-09-30 NOTE — Procedures (Signed)
ECT SERVICES Physician's Interval Evaluation & Treatment Note  Patient Identification: Jenna Dudley MRN:  [id number] Date of Evaluation:  09/30/2018 TX #: 26  MADRS:   MMSE:   P.E. Findings:  No change to physical exam  Psychiatric Interval Note:  Mood is stable.  Chronic memory problems no worse than usual.  No complaints about it  Subjective:  Patient is a 83 y.o. female seen for evaluation for Electroconvulsive Therapy. No particular complaint  Treatment Summary:   [x]   Right Unilateral             []  Bilateral   % Energy : 0.3 ms 90%   Impedance: 2300 ohms  Seizure Energy Index: 10,243 V squared  Postictal Suppression Index: 87%  Seizure Concordance Index: 73%  Medications  Pre Shock: Labetalol 20 mg Brevital 60 mg succinylcholine 80 mg  Post Shock: None  Seizure Duration: EMG 23 seconds EEG 47 seconds   Comments: Follow-up in 3 weeks  Lungs:  [x]   Clear to auscultation               []  Other:   Heart:    [x]   Regular rhythm             []  irregular rhythm    [x]   Previous H&P reviewed, patient examined and there are NO CHANGES                 []   Previous H&P reviewed, patient examined and there are changes noted.   Alethia Berthold, MD 7/6/202010:13 AM

## 2018-09-30 NOTE — Discharge Instructions (Signed)
1)  The drugs that you have been given will stay in your system until tomorrow so for the       next 24 hours you should not: ° A. Drive an automobile ° B. Make any legal decisions ° C. Drink any alcoholic beverages ° °2)  You may resume your regular meals upon return home. ° °3)  A responsible adult must take you home.  Someone should stay with you for a few          hours, then be available by phone for the remainder of the treatment day. ° °4)  You May experience any of the following symptoms: ° Headache, Nausea and a dry mouth (due to the medications you were given),  temporary memory loss and some confusion, or sore muscles (a warm bath  should help this).  If you you experience any of these symptoms let us know on                your return visit. ° °5)  Report any of the following: any acute discomfort, severe headache, or temperature        greater than 100.5 F.   Also report any unusual redness, swelling, drainage, or pain         at your IV site. ° °  You may report Symptoms to:  ECT PROGRAM- [place] at [facility] °         Phone: [phone], ECT Department  °         or Dr. Clapac's office [phone] ° °6)  Your next ECT Treatment is Monday July 27 at 8:00 ° We will call 2 days prior to your scheduled appointment for arrival times. ° °7)  Nothing to eat or drink after midnight the night before your procedure. ° °8)  Take     With a sip of water the morning of your procedure. ° °9)  Other Instructions: Call [phone] to cancel the morning of your procedure due         to illness or emergency. ° °10) We will call within 72 hours to assess how you are feeling.  °

## 2018-10-11 ENCOUNTER — Other Ambulatory Visit: Payer: Self-pay | Admitting: Psychiatry

## 2018-10-21 ENCOUNTER — Ambulatory Visit
Admission: RE | Admit: 2018-10-21 | Discharge: 2018-10-21 | Disposition: A | Discharge status: Home / Self Care | Payer: Medicare Other | Source: Ambulatory Visit | Attending: Psychiatry | Admitting: Psychiatry

## 2018-10-21 ENCOUNTER — Other Ambulatory Visit: Payer: Self-pay

## 2018-10-21 ENCOUNTER — Other Ambulatory Visit
Admission: RE | Admit: 2018-10-21 | Discharge: 2018-10-21 | Disposition: A | Discharge status: Home / Self Care | Payer: Medicare Other | Source: Ambulatory Visit | Attending: Psychiatry | Admitting: Psychiatry

## 2018-10-21 ENCOUNTER — Other Ambulatory Visit: Payer: Medicare Other

## 2018-10-21 ENCOUNTER — Other Ambulatory Visit: Payer: Self-pay | Admitting: Psychiatry

## 2018-10-21 ENCOUNTER — Encounter: Payer: Self-pay | Admitting: Anesthesiology

## 2018-10-21 DIAGNOSIS — I5032 Chronic diastolic (congestive) heart failure: Secondary | ICD-10-CM | POA: Insufficient documentation

## 2018-10-21 DIAGNOSIS — I110 Hypertensive heart disease with heart failure: Secondary | ICD-10-CM | POA: Insufficient documentation

## 2018-10-21 DIAGNOSIS — K219 Gastro-esophageal reflux disease without esophagitis: Secondary | ICD-10-CM | POA: Insufficient documentation

## 2018-10-21 DIAGNOSIS — Z96653 Presence of artificial knee joint, bilateral: Secondary | ICD-10-CM | POA: Insufficient documentation

## 2018-10-21 DIAGNOSIS — F332 Major depressive disorder, 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 features: Secondary | ICD-10-CM | POA: Diagnosis not present

## 2018-10-21 DIAGNOSIS — Z20828 Contact with and (suspected) exposure to other viral communicable diseases: Secondary | ICD-10-CM | POA: Diagnosis not present

## 2018-10-21 DIAGNOSIS — F333 Major depressive disorder, recurrent, severe with psychotic symptoms: Secondary | ICD-10-CM | POA: Insufficient documentation

## 2018-10-21 DIAGNOSIS — Z87891 Personal history of nicotine dependence: Secondary | ICD-10-CM | POA: Diagnosis not present

## 2018-10-21 LAB — SARS CORONAVIRUS 2 BY RT PCR (HOSPITAL ORDER, PERFORMED IN ~~LOC~~ HOSPITAL LAB): SARS Coronavirus 2: NEGATIVE

## 2018-10-21 MED ORDER — ONDANSETRON HCL 4 MG/2ML IJ SOLN: 4.0000 mg | Freq: Once | INTRAMUSCULAR | Status: DC | PRN

## 2018-10-21 MED ORDER — METHOHEXITAL SODIUM 100 MG/10ML IV SOSY
PREFILLED_SYRINGE | INTRAVENOUS | Status: DC | PRN
  Administered 2018-10-21: 60 mg via INTRAVENOUS

## 2018-10-21 MED ORDER — SODIUM CHLORIDE 0.9 % IV SOLN
500.0000 mL | Freq: Once | INTRAVENOUS | Status: AC
  Administered 2018-10-21: 500 mL via INTRAVENOUS

## 2018-10-21 MED ORDER — FENTANYL CITRATE (PF) 100 MCG/2ML IJ SOLN: 25.0000 ug | INTRAMUSCULAR | Status: DC | PRN

## 2018-10-21 MED ORDER — SUCCINYLCHOLINE CHLORIDE 20 MG/ML IJ SOLN
INTRAMUSCULAR | Status: DC | PRN
  Administered 2018-10-21: 80 mg via INTRAVENOUS

## 2018-10-21 MED ORDER — LABETALOL HCL 5 MG/ML IV SOLN
INTRAVENOUS | Status: DC | PRN
  Administered 2018-10-21: 20 mg via INTRAVENOUS

## 2018-10-21 MED ORDER — SUCCINYLCHOLINE CHLORIDE 20 MG/ML IJ SOLN
INTRAMUSCULAR | Status: AC
  Filled 2018-10-21: qty 1

## 2018-10-21 NOTE — Discharge Instructions (Signed)
1)  The drugs that you have been given will stay in your system until tomorrow so for the       next 24 hours you should not:  A. Drive an automobile  B. Make any legal decisions  C. Drink any alcoholic beverages  2)  You may resume your regular meals upon return home.  3)  A responsible adult must take you home.  Someone should stay with you for a few          hours, then be available by phone for the remainder of the treatment day.  4)  You May experience any of the following symptoms:  Headache, Nausea and a dry mouth (due to the medications you were given),  temporary memory loss and some confusion, or sore muscles (a warm bath  should help this).  If you you experience any of these symptoms let us know on                your return visit.  5)  Report any of the following: any acute discomfort, severe headache, or temperature        greater than 100.5 F.   Also report any unusual redness, swelling, drainage, or pain         at your IV site.    You may report Symptoms to:  [place] at [facility]          Phone: [phone], ECT Department           or Dr. Prescott Gum office [phone]  6)  Your next ECT Treatment is Monday August 17  We will call 2 days prior to your scheduled appointment for arrival times.  7)  Nothing to eat or drink after midnight the night before your procedure.  8)  Take Morning meds with a sip of water the morning of your procedure.  9)  Other Instructions: Call [phone] to cancel the morning of your procedure due         to illness or emergency.  10) We will call within 72 hours to assess how you are feeling.

## 2018-10-21 NOTE — Anesthesia Post-op Follow-up Note (Signed)
Anesthesia QCDR form completed.        

## 2018-10-21 NOTE — Procedures (Signed)
ECT SERVICES Physician's Interval Evaluation & Treatment Note  Patient Identification: Jenna Dudley MRN:  [id number] Date of Evaluation:  10/21/2018 TX #: 27  MADRS:   MMSE:   P.E. Findings:  No change to physical exam  Psychiatric Interval Note:  Mood good affect appropriate upbeat  Subjective:  Patient is a 83 y.o. female seen for evaluation for Electroconvulsive Therapy. No complaint  Treatment Summary:   [x]   Right Unilateral             []  Bilateral   % Energy : 0.3 ms 90%   Impedance: 1790 ohms  Seizure Energy Index: 4491 V squared  Postictal Suppression Index: No reading  Seizure Concordance Index: 92%  Medications  Pre Shock: Labetalol 20 mg Brevital 60 mg succinylcholine 80 mg  Post Shock:    Seizure Duration: 14 seconds by movement 40 seconds by EEG   Comments: Follow-up 3 weeks  Lungs:  [x]   Clear to auscultation               []  Other:   Heart:    [x]   Regular rhythm             []  irregular rhythm    [x]   Previous H&P reviewed, patient examined and there are NO CHANGES                 []   Previous H&P reviewed, patient examined and there are changes noted.   Jenna Berthold, MD 7/27/202010:13 AM

## 2018-10-21 NOTE — Transfer of Care (Signed)
Immediate Anesthesia Transfer of Care Note  Patient: Jenna Dudley  Procedure(s) Performed: ECT TX  Patient Location: PACU  Anesthesia Type:General  Level of Consciousness: sedated  Airway & Oxygen Therapy: Patient Spontanous Breathing  Post-op Assessment: Report given to RN  Post vital signs: stable  Last Vitals:  Vitals Value Taken Time  BP    Temp    Pulse    Resp    SpO2      Last Pain:  Vitals:   10/21/18 0824  TempSrc: Oral  PainSc: 0-No pain         Complications: No apparent anesthesia complications

## 2018-10-21 NOTE — Anesthesia Preprocedure Evaluation (Signed)
Anesthesia Evaluation  Patient identified by MRN, date of birth, ID band Patient awake    Reviewed: Allergy & Precautions, NPO status , Patient's Chart, lab work & pertinent test results, reviewed documented beta blocker date and time   Airway Mallampati: II  TM Distance: >3 FB     Dental  (+) Chipped   Pulmonary pneumonia, resolved, former smoker,           Cardiovascular hypertension, Pt. on medications and Pt. on home beta blockers +CHF       Neuro/Psych PSYCHIATRIC DISORDERS Depression Bipolar Disorder Dementia TIA Neuromuscular disease    GI/Hepatic GERD  Controlled,  Endo/Other    Renal/GU      Musculoskeletal  (+) Arthritis ,   Abdominal   Peds  Hematology   Anesthesia Other Findings Tends to run a low sat. 92-93. EKG and ECHO checked from a yr ago.  Reproductive/Obstetrics                             Anesthesia Physical Anesthesia Plan  ASA: III  Anesthesia Plan: General   Post-op Pain Management:    Induction: Intravenous  PONV Risk Score and Plan:   Airway Management Planned:   Additional Equipment:   Intra-op Plan:   Post-operative Plan:   Informed Consent: I have reviewed the patients History and Physical, chart, labs and discussed the procedure including the risks, benefits and alternatives for the proposed anesthesia with the patient or authorized representative who has indicated his/her understanding and acceptance.       Plan Discussed with: CRNA  Anesthesia Plan Comments:         Anesthesia Quick Evaluation

## 2018-10-21 NOTE — Anesthesia Postprocedure Evaluation (Signed)
Anesthesia Post Note  Patient: ZOHA SPRANGER  Procedure(s) Performed: ECT TX  Patient location during evaluation: PACU Anesthesia Type: General Level of consciousness: awake and alert Pain management: pain level controlled Vital Signs Assessment: post-procedure vital signs reviewed and stable Respiratory status: spontaneous breathing, nonlabored ventilation, respiratory function stable and patient connected to nasal cannula oxygen Cardiovascular status: blood pressure returned to baseline and stable Postop Assessment: no apparent nausea or vomiting Anesthetic complications: no     Last Vitals:  Vitals:   10/21/18 1057 10/21/18 1109  BP: (!) 123/55 120/67  Pulse: 84 84  Resp: (!) 21 18  Temp: (!) 36.2 C   SpO2: 92%     Last Pain:  Vitals:   10/21/18 1109  TempSrc:   PainSc: 0-No pain                 Yamila Cragin S

## 2018-10-21 NOTE — H&P (Signed)
Jenna Dudley is an 83 y.o. female.   Chief Complaint: No new complaint.  Mood has been good.  Everything feeling stable. HPI: History of recurrent psychotic depression held in place with maintenance ECT and medication  Past Medical History:  Diagnosis Date  . Bilateral carpal tunnel syndrome 07/27/14  . Bipolar disorder [facility](HCC)    [facility]  . Chronic diastolic CHF (congestive heart failure) (HCC)    a. 03/2017 Echo: EF 55-60%, no rwma, Gr1 DD, mild MR.  . Chronic low back pain 07/29/14  . Degenerative arthritis of lumbar spine 07/27/14  . Dementia (HCC)   . Depression   . Esophageal spasm 07/29/14  . GERD (gastroesophageal reflux disease)   . History of stress test    a. 04/2017 MV: low risk stress test.  EF 55-65%. Probable apical ant/apical, basal, and mid inflat attenuation artifact vs ischemia/scar.  . Hyperlipemia 07/29/14  . Hypertension   . Pneumonia   . Spinal stenosis 07/29/14    Past Surgical History:  Procedure Laterality Date  . HIP SURGERY Right   . INNER EAR SURGERY Right   . REPLACEMENT TOTAL KNEE BILATERAL Bilateral 07/27/14    Family History  Problem Relation Age of Onset  . Hypertension Mother   . Stroke Mother    Social History:  reports that she quit smoking about 43 years ago. Her smoking use included cigarettes. She has never used smokeless tobacco. She reports that she does not drink alcohol or use drugs.  Allergies:  Allergies  Allergen Reactions  . Aspirin Other (See Comments)    Unknown reaction  . Sulfa Antibiotics Other (See Comments)    Unknown reaction    (Not in a hospital admission)   Results for orders placed or performed during the hospital encounter of 10/21/18 (from the past 48 hour(s))  SARS Coronavirus 2 (CEPHEID - Performed in [facility]), Hosp Order     Status: None   Collection Time: 10/21/18  8:05 AM   Specimen: Nasopharyngeal Swab  Result Value Ref Range   SARS Coronavirus 2 NEGATIVE  NEGATIVE    Comment: (NOTE) If result is NEGATIVE SARS-CoV-2 target nucleic acids are NOT DETECTED. The SARS-CoV-2 RNA is generally detectable in upper and lower  respiratory specimens during the acute phase of infection. The lowest  concentration of SARS-CoV-2 viral copies this assay can detect is 250  copies / mL. A negative result does not preclude SARS-CoV-2 infection  and should not be used as the sole basis for treatment or other  patient management decisions.  A negative result may occur with  improper specimen collection / handling, submission of specimen other  than nasopharyngeal swab, presence of viral mutation(s) within the  areas targeted by this assay, and inadequate number of viral copies  (<250 copies / mL). A negative result must be combined with clinical  observations, patient history, and epidemiological information. If result is POSITIVE SARS-CoV-2 target nucleic acids are DETECTED. The SARS-CoV-2 RNA is generally detectable in upper and lower  respiratory specimens dur ing the acute phase of infection.  Positive  results are indicative of active infection with SARS-CoV-2.  Clinical  correlation with patient history and other diagnostic information is  necessary to determine patient infection status.  Positive results do  not rule out bacterial infection or co-infection with other viruses. If result is PRESUMPTIVE POSTIVE SARS-CoV-2 nucleic acids MAY BE PRESENT.   A presumptive positive result was obtained on the submitted specimen  and confirmed on repeat testing.  While 2019 novel coronavirus  (SARS-CoV-2) nucleic acids may be present in the submitted sample  additional confirmatory testing may be necessary for epidemiological  and / or clinical management purposes  to differentiate between  SARS-CoV-2 and other Sarbecovirus currently known to infect humans.  If clinically indicated additional testing with an alternate test  methodology [phone]) is advised. The  SARS-CoV-2 RNA is generally  detectable in upper and lower respiratory sp ecimens during the acute  phase of infection. The expected result is Negative. Fact Sheet for Patients:  StrictlyIdeas.no Fact Sheet for Healthcare Providers: BankingDealers.co.za This test is not yet approved or cleared by the [place] FDA and has been authorized for detection and/or diagnosis of SARS-CoV-2 by FDA under an Emergency Use Authorization (EUA).  This EUA will remain in effect (meaning this test can be used) for the duration of the COVID-19 declaration under Section 564(b)(1) of the Act, 21 U.S.C. section 360bbb-3(b)(1), unless the authorization is terminated or revoked sooner. Performed at [facility], [address] [place], [place] [address]    No results found.  Review of Systems  Constitutional: Negative.   HENT: Negative.   Eyes: Negative.   Respiratory: Negative.   Cardiovascular: Negative.   Gastrointestinal: Negative.   Musculoskeletal: Negative.   Skin: Negative.   Neurological: Negative.   Psychiatric/Behavioral: Negative.     Blood pressure (!) 144/60, pulse 88, temperature 97.8 F (36.6 C), temperature source Oral, resp. rate 18, SpO2 93 %. Physical Exam  Nursing note and vitals reviewed. Constitutional: She appears well-developed and well-nourished.  HENT:  Head: Normocephalic and atraumatic.  Eyes: Pupils are equal, round, and reactive to light. Conjunctivae are normal.  Neck: Normal range of motion.  Cardiovascular: Regular rhythm and normal heart sounds.  Respiratory: Effort normal.  GI: Soft.  Musculoskeletal: Normal range of motion.  Neurological: She is alert.  Skin: Skin is warm and dry.  Psychiatric: She has a normal mood and affect. Her behavior is normal. Judgment and thought content normal.     Assessment/Plan Reviewed treatment plan.  Treatment today.  Follow-up in 3 weeks.  Alethia Berthold,  MD 10/21/2018, 10:12 AM

## 2018-11-08 ENCOUNTER — Other Ambulatory Visit: Payer: Self-pay | Admitting: Psychiatry

## 2018-11-08 ENCOUNTER — Telehealth: Payer: Self-pay

## 2018-11-11 ENCOUNTER — Encounter: Payer: Self-pay | Admitting: Anesthesiology

## 2018-11-11 ENCOUNTER — Encounter
Admission: RE | Admit: 2018-11-11 | Discharge: 2018-11-11 | Disposition: A | Discharge status: Home / Self Care | Payer: Medicare Other | Source: Ambulatory Visit | Attending: Psychiatry | Admitting: Psychiatry

## 2018-11-11 ENCOUNTER — Other Ambulatory Visit
Admission: RE | Admit: 2018-11-11 | Discharge: 2018-11-11 | Disposition: A | Discharge status: Home / Self Care | Payer: Medicare Other | Source: Ambulatory Visit | Attending: Psychiatry | Admitting: Psychiatry

## 2018-11-11 ENCOUNTER — Other Ambulatory Visit: Payer: Self-pay

## 2018-11-11 DIAGNOSIS — Z20828 Contact with and (suspected) exposure to other viral communicable diseases: Secondary | ICD-10-CM | POA: Diagnosis not present

## 2018-11-11 DIAGNOSIS — K219 Gastro-esophageal reflux disease without esophagitis: Secondary | ICD-10-CM | POA: Diagnosis not present

## 2018-11-11 DIAGNOSIS — F332 Major depressive disorder, 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 features: Secondary | ICD-10-CM

## 2018-11-11 DIAGNOSIS — F0390 Unspecified dementia without behavioral disturbance: Secondary | ICD-10-CM | POA: Diagnosis not present

## 2018-11-11 DIAGNOSIS — Z87891 Personal history of nicotine dependence: Secondary | ICD-10-CM | POA: Insufficient documentation

## 2018-11-11 DIAGNOSIS — I110 Hypertensive heart disease with heart failure: Secondary | ICD-10-CM | POA: Insufficient documentation

## 2018-11-11 DIAGNOSIS — Z882 Allergy status to sulfonamides status: Secondary | ICD-10-CM | POA: Diagnosis not present

## 2018-11-11 DIAGNOSIS — F319 Bipolar disorder, unspecified: Secondary | ICD-10-CM | POA: Insufficient documentation

## 2018-11-11 DIAGNOSIS — Z01812 Encounter for preprocedural laboratory examination: Secondary | ICD-10-CM | POA: Insufficient documentation

## 2018-11-11 DIAGNOSIS — I5032 Chronic diastolic (congestive) heart failure: Secondary | ICD-10-CM | POA: Insufficient documentation

## 2018-11-11 LAB — SARS CORONAVIRUS 2 BY RT PCR (HOSPITAL ORDER, PERFORMED IN ~~LOC~~ HOSPITAL LAB): SARS Coronavirus 2: NEGATIVE

## 2018-11-11 MED ORDER — SUCCINYLCHOLINE CHLORIDE 200 MG/10ML IV SOSY
PREFILLED_SYRINGE | INTRAVENOUS | Status: DC | PRN
  Administered 2018-11-11: 80 mg via INTRAVENOUS

## 2018-11-11 MED ORDER — METHOHEXITAL SODIUM 100 MG/10ML IV SOSY
PREFILLED_SYRINGE | INTRAVENOUS | Status: DC | PRN
  Administered 2018-11-11: 60 mg via INTRAVENOUS

## 2018-11-11 MED ORDER — SUCCINYLCHOLINE CHLORIDE 20 MG/ML IJ SOLN
INTRAMUSCULAR | Status: AC
  Filled 2018-11-11: qty 1

## 2018-11-11 MED ORDER — LABETALOL HCL 5 MG/ML IV SOLN
INTRAVENOUS | Status: AC
  Filled 2018-11-11: qty 4

## 2018-11-11 MED ORDER — LABETALOL HCL 5 MG/ML IV SOLN
INTRAVENOUS | Status: DC | PRN
  Administered 2018-11-11: 20 mg via INTRAVENOUS

## 2018-11-11 MED ORDER — SODIUM CHLORIDE 0.9 % IV SOLN
500.0000 mL | Freq: Once | INTRAVENOUS | Status: AC
  Administered 2018-11-11: 500 mL via INTRAVENOUS

## 2018-11-11 MED ORDER — ONDANSETRON HCL 4 MG/2ML IJ SOLN: 4.0000 mg | Freq: Once | INTRAMUSCULAR | Status: DC | PRN

## 2018-11-11 MED ORDER — FENTANYL CITRATE (PF) 100 MCG/2ML IJ SOLN: 25.0000 ug | INTRAMUSCULAR | Status: DC | PRN

## 2018-11-11 NOTE — Procedures (Signed)
ECT SERVICES Physician's Interval Evaluation & Treatment Note  Patient Identification: Jenna Dudley MRN:  [id number] Date of Evaluation:  11/11/2018 TX #: 28  MADRS:   MMSE:   P.E. Findings:  No change  Psychiatric Interval Note:  Doing well  Subjective:  Patient is a 83 y.o. female seen for evaluation for Electroconvulsive Therapy. No complaint  Treatment Summary:   []   Right Unilateral             [x]  Bilateral   % Energy : 0.62ms 90 percent   Impedance: 1870 ohm  Seizure Energy Index: 6348  Postictal Suppression Index: 34  Seizure Concordance Index: 68 lab 20mg  brev 60mg  suc 80mg   Medications  Pre Shock: see above  Post Shock:    Seizure Duration: 20s emg 58sec eeg   Comments: 3 wk   Lungs:  [x]   Clear to auscultation               []  Other:   Heart:    [x]   Regular rhythm             []  irregular rhythm    [x]   Previous H&P reviewed, patient examined and there are NO CHANGES                 []   Previous H&P reviewed, patient examined and there are changes noted.   Alethia Berthold, MD 8/17/202011:03 AM

## 2018-11-11 NOTE — Discharge Instructions (Signed)
1)  The drugs that you have been given will stay in your system until tomorrow so for the       next 24 hours you should not:  A. Drive an automobile  B. Make any legal decisions  C. Drink any alcoholic beverages  2)  You may resume your regular meals upon return home.  3)  A responsible adult must take you home.  Someone should stay with you for a few          hours, then be available by phone for the remainder of the treatment day.  4)  You May experience any of the following symptoms:  Headache, Nausea and a dry mouth (due to the medications you were given),  temporary memory loss and some confusion, or sore muscles (a warm bath  should help this).  If you you experience any of these symptoms let us know on                your return visit.  5)  Report any of the following: any acute discomfort, severe headache, or temperature        greater than 100.5 F.   Also report any unusual redness, swelling, drainage, or pain         at your IV site.    You may report Symptoms to:  [facility]          Phone: [phone], ECT Department           or Dr. Prescott Gum office [phone]  6)  Your next ECT Treatment is Wednesday September 9 at 8:00  We will call 2 days prior to your scheduled appointment for arrival times.  7)  Nothing to eat or drink after midnight the night before your procedure.  8)  Take      With a sip of water the morning of your procedure.  9)  Other Instructions: Call [phone] to cancel the morning of your procedure due         to illness or emergency.  10) We will call within 72 hours to assess how you are feeling.

## 2018-11-11 NOTE — Transfer of Care (Signed)
Immediate Anesthesia Transfer of Care Note  Patient: Jenna Dudley  Procedure(s) Performed: ECT TX  Patient Location: PACU  Anesthesia Type:General  Level of Consciousness: sedated  Airway & Oxygen Therapy: Patient Spontanous Breathing and Patient connected to face mask oxygen  Post-op Assessment: Report given to RN and Post -op Vital signs reviewed and stable  Post vital signs: Reviewed and stable  Last Vitals:  Vitals Value Taken Time  BP 136/59 11/11/18 1131  Temp 36.8 C 11/11/18 1131  Pulse 84 11/11/18 1133  Resp 24 11/11/18 1133  SpO2 96 % 11/11/18 1133  Vitals shown include unvalidated device data.  Last Pain:  Vitals:   11/11/18 1131  TempSrc:   PainSc: 0-No pain         Complications: No apparent anesthesia complications

## 2018-11-11 NOTE — Anesthesia Preprocedure Evaluation (Signed)
Anesthesia Evaluation  Patient identified by MRN, date of birth, ID band Patient awake    Reviewed: Allergy & Precautions, H&P , NPO status , Patient's Chart, lab work & pertinent test results, reviewed documented beta blocker date and time   Airway Mallampati: II       Dental  (+) Poor Dentition   Pulmonary neg pulmonary ROS, shortness of breath and with exertion, pneumonia, resolved, former smoker,  Baseline SOB with exertion but comfortable at rest consistent were her baseline status.  JA   Pulmonary exam normal        Cardiovascular Exercise Tolerance: Poor hypertension, On Medications +CHF  negative cardio ROS Normal cardiovascular exam     Neuro/Psych PSYCHIATRIC DISORDERS Depression Bipolar Disorder Dementia  Neuromuscular disease negative neurological ROS  negative psych ROS   GI/Hepatic negative GI ROS, Neg liver ROS, GERD  ,  Endo/Other  negative endocrine ROS  Renal/GU negative Renal ROS  negative genitourinary   Musculoskeletal  (+) Arthritis ,   Abdominal   Peds  Hematology negative hematology ROS (+)   Anesthesia Other Findings   Reproductive/Obstetrics negative OB ROS                             Anesthesia Physical  Anesthesia Plan  ASA: III  Anesthesia Plan: General   Post-op Pain Management:    Induction:   PONV Risk Score and Plan:   Airway Management Planned: Mask  Additional Equipment:   Intra-op Plan:   Post-operative Plan:   Informed Consent: I have reviewed the patients History and Physical, chart, labs and discussed the procedure including the risks, benefits and alternatives for the proposed anesthesia with the patient or authorized representative who has indicated his/her understanding and acceptance.       Plan Discussed with: CRNA  Anesthesia Plan Comments:         Anesthesia Quick Evaluation  

## 2018-11-11 NOTE — H&P (Signed)
Jenna Dudley is an 83 y.o. female.   Chief Complaint: no complaint today HPI: recurrent severe depression doing well on ect  Past Medical History:  Diagnosis Date  . Bilateral carpal tunnel syndrome 07/27/14  . Bipolar disorder [facility])    geropsych admission to [facility] in Dec 2018  . Chronic diastolic CHF (congestive heart failure) ([place])    a. 03/2017 Echo: EF 55-60%, no rwma, Gr1 DD, mild MR.  . Chronic low back pain 07/29/14  . Degenerative arthritis of lumbar spine 07/27/14  . Dementia ([place])   . Depression   . Esophageal spasm 07/29/14  . GERD (gastroesophageal reflux disease)   . History of stress test    a. 04/2017 MV: low risk stress test.  EF 55-65%. Probable apical ant/apical, basal, and mid inflat attenuation artifact vs ischemia/scar.  . Hyperlipemia 07/29/14  . Hypertension   . Pneumonia   . Spinal stenosis 07/29/14    Past Surgical History:  Procedure Laterality Date  . HIP SURGERY Right   . INNER EAR SURGERY Right   . REPLACEMENT TOTAL KNEE BILATERAL Bilateral 07/27/14    Family History  Problem Relation Age of Onset  . Hypertension Mother   . Stroke Mother    Social History:  reports that she quit smoking about 43 years ago. Her smoking use included cigarettes. She has never used smokeless tobacco. She reports that she does not drink alcohol or use drugs.  Allergies:  Allergies  Allergen Reactions  . Aspirin Other (See Comments)    Unknown reaction  . Sulfa Antibiotics Other (See Comments)    Unknown reaction    (Not in a hospital admission)   No results found for this or any previous visit (from the past 48 hour(s)). No results found.  Review of Systems  Constitutional: Negative.   HENT: Negative.   Eyes: Negative.   Respiratory: Negative.   Cardiovascular: Negative.   Gastrointestinal: Negative.   Musculoskeletal: Positive for back pain.  Skin: Negative.   Neurological: Negative.   Psychiatric/Behavioral: Negative.     Blood pressure (!) 148/61,  pulse 80, temperature 98.1 F (36.7 C), temperature source Oral, resp. rate 18, SpO2 98 %. Physical Exam  Nursing note and vitals reviewed. Constitutional: She appears well-developed and well-nourished.  HENT:  Head: Normocephalic and atraumatic.  Eyes: Pupils are equal, round, and reactive to light. Conjunctivae are normal.  Neck: Normal range of motion.  Cardiovascular: Regular rhythm and normal heart sounds.  Respiratory: Effort normal. No respiratory distress.  GI: Soft.  Musculoskeletal: Normal range of motion.  Neurological: She is alert.  Skin: Skin is warm and dry.  Psychiatric: She has a normal mood and affect. Her speech is normal and behavior is normal. Judgment and thought content normal. Cognition and memory are normal.     Assessment/Plan Continue regular q2-3wk  Alethia Berthold, MD 11/11/2018, 10:05 AM

## 2018-11-11 NOTE — Anesthesia Post-op Follow-up Note (Signed)
Anesthesia QCDR form completed.        

## 2018-11-11 NOTE — Anesthesia Procedure Notes (Signed)
Date/Time: 11/11/2018 11:14 AM Performed by: Dionne Bucy, CRNA Pre-anesthesia Checklist: Patient identified, Emergency Drugs available, Suction available and Patient being monitored Patient Re-evaluated:Patient Re-evaluated prior to induction Oxygen Delivery Method: Circle system utilized Preoxygenation: Pre-oxygenation with 100% oxygen Induction Type: IV induction Ventilation: Mask ventilation without difficulty and Mask ventilation throughout procedure Airway Equipment and Method: Bite block Placement Confirmation: positive ETCO2 Dental Injury: Teeth and Oropharynx as per pre-operative assessment

## 2018-11-11 NOTE — Anesthesia Postprocedure Evaluation (Signed)
Anesthesia Post Note  Patient: Jenna Dudley  Procedure(s) Performed: ECT TX  Patient location during evaluation: PACU Anesthesia Type: General Level of consciousness: awake and alert Pain management: pain level controlled Vital Signs Assessment: post-procedure vital signs reviewed and stable Respiratory status: spontaneous breathing, nonlabored ventilation, respiratory function stable and patient connected to nasal cannula oxygen Cardiovascular status: blood pressure returned to baseline and stable Postop Assessment: no apparent nausea or vomiting Anesthetic complications: no     Last Vitals:  Vitals:   11/11/18 1138 11/11/18 1146  BP: (!) 147/73 (!) 144/69  Pulse: 83 80  Resp: (!) 25 (!) 21  Temp:  36.7 C  SpO2: 95% 94%    Last Pain:  Vitals:   11/11/18 1146  TempSrc:   PainSc: 0-No pain                 Martha Clan

## 2018-11-12 ENCOUNTER — Other Ambulatory Visit: Payer: Self-pay | Admitting: Psychiatry

## 2018-11-29 ENCOUNTER — Telehealth: Payer: Self-pay

## 2018-12-03 ENCOUNTER — Other Ambulatory Visit: Payer: Self-pay | Admitting: Psychiatry

## 2018-12-04 ENCOUNTER — Other Ambulatory Visit: Payer: Self-pay

## 2018-12-04 ENCOUNTER — Encounter
Admission: RE | Admit: 2018-12-04 | Discharge: 2018-12-04 | Disposition: A | Discharge status: Home / Self Care | Payer: Medicare Other | Source: Ambulatory Visit | Attending: Psychiatry | Admitting: Psychiatry

## 2018-12-04 ENCOUNTER — Other Ambulatory Visit
Admission: RE | Admit: 2018-12-04 | Discharge: 2018-12-04 | Disposition: A | Discharge status: Home / Self Care | Payer: Medicare Other | Source: Ambulatory Visit | Attending: Psychiatry | Admitting: Psychiatry

## 2018-12-04 ENCOUNTER — Encounter: Payer: Self-pay | Admitting: Anesthesiology

## 2018-12-04 DIAGNOSIS — G8929 Other chronic pain: Secondary | ICD-10-CM | POA: Insufficient documentation

## 2018-12-04 DIAGNOSIS — F323 Major depressive disorder, single episode, severe with psychotic features: Secondary | ICD-10-CM | POA: Diagnosis present

## 2018-12-04 DIAGNOSIS — Z01812 Encounter for preprocedural laboratory examination: Secondary | ICD-10-CM | POA: Diagnosis present

## 2018-12-04 DIAGNOSIS — E785 Hyperlipidemia, unspecified: Secondary | ICD-10-CM | POA: Diagnosis not present

## 2018-12-04 DIAGNOSIS — F319 Bipolar disorder, unspecified: Secondary | ICD-10-CM | POA: Diagnosis not present

## 2018-12-04 DIAGNOSIS — Z8249 Family history of ischemic heart disease and other diseases of the circulatory system: Secondary | ICD-10-CM | POA: Insufficient documentation

## 2018-12-04 DIAGNOSIS — Z96653 Presence of artificial knee joint, bilateral: Secondary | ICD-10-CM | POA: Insufficient documentation

## 2018-12-04 DIAGNOSIS — F333 Major depressive disorder, recurrent, severe with psychotic symptoms: Secondary | ICD-10-CM

## 2018-12-04 DIAGNOSIS — Z882 Allergy status to sulfonamides status: Secondary | ICD-10-CM | POA: Insufficient documentation

## 2018-12-04 DIAGNOSIS — Z20828 Contact with and (suspected) exposure to other viral communicable diseases: Secondary | ICD-10-CM | POA: Insufficient documentation

## 2018-12-04 DIAGNOSIS — I110 Hypertensive heart disease with heart failure: Secondary | ICD-10-CM | POA: Insufficient documentation

## 2018-12-04 DIAGNOSIS — F0390 Unspecified dementia without behavioral disturbance: Secondary | ICD-10-CM | POA: Insufficient documentation

## 2018-12-04 DIAGNOSIS — M545 Low back pain: Secondary | ICD-10-CM | POA: Insufficient documentation

## 2018-12-04 DIAGNOSIS — Z886 Allergy status to analgesic agent status: Secondary | ICD-10-CM | POA: Diagnosis not present

## 2018-12-04 DIAGNOSIS — Z87891 Personal history of nicotine dependence: Secondary | ICD-10-CM | POA: Insufficient documentation

## 2018-12-04 DIAGNOSIS — I5032 Chronic diastolic (congestive) heart failure: Secondary | ICD-10-CM | POA: Diagnosis not present

## 2018-12-04 DIAGNOSIS — Z79899 Other long term (current) drug therapy: Secondary | ICD-10-CM | POA: Diagnosis not present

## 2018-12-04 DIAGNOSIS — K219 Gastro-esophageal reflux disease without esophagitis: Secondary | ICD-10-CM | POA: Insufficient documentation

## 2018-12-04 LAB — SARS CORONAVIRUS 2 BY RT PCR (HOSPITAL ORDER, PERFORMED IN ~~LOC~~ HOSPITAL LAB): SARS Coronavirus 2: NEGATIVE

## 2018-12-04 MED ORDER — LABETALOL HCL 5 MG/ML IV SOLN
INTRAVENOUS | Status: DC | PRN
  Administered 2018-12-04: 20 mg via INTRAVENOUS

## 2018-12-04 MED ORDER — ONDANSETRON HCL 4 MG/2ML IJ SOLN: 4.0000 mg | Freq: Once | INTRAMUSCULAR | Status: DC | PRN

## 2018-12-04 MED ORDER — METHOHEXITAL SODIUM 100 MG/10ML IV SOSY
PREFILLED_SYRINGE | INTRAVENOUS | Status: DC | PRN
  Administered 2018-12-04: 60 mg via INTRAVENOUS

## 2018-12-04 MED ORDER — LABETALOL HCL 5 MG/ML IV SOLN
INTRAVENOUS | Status: AC
  Filled 2018-12-04: qty 4

## 2018-12-04 MED ORDER — SODIUM CHLORIDE 0.9 % IV SOLN
500.0000 mL | Freq: Once | INTRAVENOUS | Status: AC
  Administered 2018-12-04: 09:00:00 500 mL via INTRAVENOUS

## 2018-12-04 MED ORDER — METHOHEXITAL SODIUM 0.5 G IJ SOLR
INTRAMUSCULAR | Status: AC
  Filled 2018-12-04: qty 500

## 2018-12-04 MED ORDER — SUCCINYLCHOLINE CHLORIDE 200 MG/10ML IV SOSY
PREFILLED_SYRINGE | INTRAVENOUS | Status: DC | PRN
  Administered 2018-12-04: 80 mg via INTRAVENOUS

## 2018-12-04 MED ORDER — SODIUM CHLORIDE 0.9 % IV SOLN
INTRAVENOUS | Status: DC | PRN
  Administered 2018-12-04: 09:00:00 via INTRAVENOUS

## 2018-12-04 MED ORDER — FENTANYL CITRATE (PF) 100 MCG/2ML IJ SOLN: 25.0000 ug | INTRAMUSCULAR | Status: DC | PRN

## 2018-12-04 MED ORDER — SUCCINYLCHOLINE CHLORIDE 20 MG/ML IJ SOLN
INTRAMUSCULAR | Status: AC
  Filled 2018-12-04: qty 1

## 2018-12-04 NOTE — Anesthesia Preprocedure Evaluation (Addendum)
Anesthesia Evaluation  Patient identified by MRN, date of birth, ID band Patient awake    Reviewed: Allergy & Precautions, NPO status , Patient's Chart, lab work & pertinent test results, reviewed documented beta blocker date and time   Airway Mallampati: II  TM Distance: >3 FB     Dental  (+) Chipped   Pulmonary pneumonia, resolved, former smoker,           Cardiovascular hypertension, Pt. on medications and Pt. on home beta blockers +CHF       Neuro/Psych PSYCHIATRIC DISORDERS Depression Bipolar Disorder Dementia  Neuromuscular disease    GI/Hepatic GERD  Controlled,  Endo/Other    Renal/GU      Musculoskeletal  (+) Arthritis ,   Abdominal   Peds  Hematology   Anesthesia Other Findings   Reproductive/Obstetrics                             Anesthesia Physical Anesthesia Plan  ASA: III  Anesthesia Plan: General   Post-op Pain Management:    Induction: Intravenous  PONV Risk Score and Plan:   Airway Management Planned:   Additional Equipment:   Intra-op Plan:   Post-operative Plan:   Informed Consent: I have reviewed the patients History and Physical, chart, labs and discussed the procedure including the risks, benefits and alternatives for the proposed anesthesia with the patient or authorized representative who has indicated his/her understanding and acceptance.       Plan Discussed with: CRNA  Anesthesia Plan Comments:         Anesthesia Quick Evaluation

## 2018-12-04 NOTE — Anesthesia Postprocedure Evaluation (Signed)
Anesthesia Post Note  Patient: Jenna Dudley  Procedure(s) Performed: ECT TX  Patient location during evaluation: PACU Anesthesia Type: General Level of consciousness: awake and alert Pain management: pain level controlled Vital Signs Assessment: post-procedure vital signs reviewed and stable Respiratory status: spontaneous breathing, nonlabored ventilation, respiratory function stable and patient connected to nasal cannula oxygen Cardiovascular status: blood pressure returned to baseline and stable Postop Assessment: no apparent nausea or vomiting Anesthetic complications: no     Last Vitals:  Vitals:   12/04/18 1100 12/04/18 1111  BP:  (!) 148/66  Pulse: 78 79  Resp: 17 18  Temp:  (!) 36.2 C  SpO2: 94%     Last Pain:  Vitals:   12/04/18 1111  TempSrc: Oral  PainSc: 0-No pain                 Bain Whichard S

## 2018-12-04 NOTE — Anesthesia Post-op Follow-up Note (Signed)
Anesthesia QCDR form completed.        

## 2018-12-04 NOTE — H&P (Signed)
Jenna Dudley is an 83 y.o. female.   Chief Complaint: Patient has no specific chief complaint today HPI: History of recurrent severe psychotic depression which is held at bay by a combination of medication and ECT.  Good response to maintenance ECT treatment  Past Medical History:  Diagnosis Date  . Bilateral carpal tunnel syndrome 07/27/14  . Bipolar disorder [facility](HCC)    geropsych admission to [facility] in Dec 2018  . Chronic diastolic CHF (congestive heart failure) (HCC)    a. 03/2017 Echo: EF 55-60%, no rwma, Gr1 DD, mild MR.  . Chronic low back pain 07/29/14  . Degenerative arthritis of lumbar spine 07/27/14  . Dementia (HCC)   . Depression   . Esophageal spasm 07/29/14  . GERD (gastroesophageal reflux disease)   . History of stress test    a. 04/2017 MV: low risk stress test.  EF 55-65%. Probable apical ant/apical, basal, and mid inflat attenuation artifact vs ischemia/scar.  . Hyperlipemia 07/29/14  . Hypertension   . Pneumonia   . Spinal stenosis 07/29/14    Past Surgical History:  Procedure Laterality Date  . HIP SURGERY Right   . INNER EAR SURGERY Right   . REPLACEMENT TOTAL KNEE BILATERAL Bilateral 07/27/14    Family History  Problem Relation Age of Onset  . Hypertension Mother   . Stroke Mother    Social History:  reports that she quit smoking about 43 years ago. Her smoking use included cigarettes. She has never used smokeless tobacco. She reports that she does not drink alcohol or use drugs.  Allergies:  Allergies  Allergen Reactions  . Aspirin Other (See Comments)    Unknown reaction  . Sulfa Antibiotics Other (See Comments)    Unknown reaction    (Not in a hospital admission)   Results for orders placed or performed during the hospital encounter of 12/04/18 (from the past 48 hour(s))  SARS Coronavirus 2 [facility](Hospital order, Performed in [facility] hospital lab) Nasopharyngeal Nasopharyngeal Swab     Status: None   Collection Time: 12/04/18  8:06 AM   Specimen:  Nasopharyngeal Swab  Result Value Ref Range   SARS Coronavirus 2 NEGATIVE NEGATIVE    Comment: (NOTE) If result is NEGATIVE SARS-CoV-2 target nucleic acids are NOT DETECTED. The SARS-CoV-2 RNA is generally detectable in upper and lower  respiratory specimens during the acute phase of infection. The lowest  concentration of SARS-CoV-2 viral copies this assay can detect is 250  copies / mL. A negative result does not preclude SARS-CoV-2 infection  and should not be used as the sole basis for treatment or other  patient management decisions.  A negative result may occur with  improper specimen collection / handling, submission of specimen other  than nasopharyngeal swab, presence of viral mutation(s) within the  areas targeted by this assay, and inadequate number of viral copies  (<250 copies / mL). A negative result must be combined with clinical  observations, patient history, and epidemiological information. If result is POSITIVE SARS-CoV-2 target nucleic acids are DETECTED. The SARS-CoV-2 RNA is generally detectable in upper and lower  respiratory specimens dur ing the acute phase of infection.  Positive  results are indicative of active infection with SARS-CoV-2.  Clinical  correlation with patient history and other diagnostic information is  necessary to determine patient infection status.  Positive results do  not rule out bacterial infection or co-infection with other viruses. If result is PRESUMPTIVE POSTIVE SARS-CoV-2 nucleic acids MAY BE PRESENT.   A presumptive  positive result was obtained on the submitted specimen  and confirmed on repeat testing.  While 2019 novel coronavirus  (SARS-CoV-2) nucleic acids may be present in the submitted sample  additional confirmatory testing may be necessary for epidemiological  and / or clinical management purposes  to differentiate between  SARS-CoV-2 and other Sarbecovirus currently known to infect humans.  If clinically indicated  additional testing with an alternate test  methodology [phone]) is advised. The SARS-CoV-2 RNA is generally  detectable in upper and lower respiratory sp ecimens during the acute  phase of infection. The expected result is Negative. Fact Sheet for Patients:  StrictlyIdeas.no Fact Sheet for Healthcare Providers: BankingDealers.co.za This test is not yet approved or cleared by the [place] FDA and has been authorized for detection and/or diagnosis of SARS-CoV-2 by FDA under an Emergency Use Authorization (EUA).  This EUA will remain in effect (meaning this test can be used) for the duration of the COVID-19 declaration under Section 564(b)(1) of the Act, 21 U.S.C. section 360bbb-3(b)(1), unless the authorization is terminated or revoked sooner. Performed at [facility], [address] [place], [place] [address]    No results found.  Review of Systems  Constitutional: Negative.   HENT: Negative.   Eyes: Negative.   Respiratory: Negative.   Cardiovascular: Negative.   Gastrointestinal: Negative.   Musculoskeletal: Negative.   Skin: Negative.   Neurological: Negative.   Psychiatric/Behavioral: Negative.     Blood pressure (!) 148/66, pulse 79, temperature (!) 97.1 F (36.2 C), temperature source Oral, resp. rate 18, SpO2 94 %. Physical Exam  Nursing note and vitals reviewed. Constitutional: She appears well-developed and well-nourished.  HENT:  Head: Normocephalic and atraumatic.  Eyes: Pupils are equal, round, and reactive to light. Conjunctivae are normal.  Neck: Normal range of motion.  Cardiovascular: Regular rhythm and normal heart sounds.  Respiratory: Effort normal.  GI: Soft.  Musculoskeletal: Normal range of motion.  Neurological: She is alert.  Skin: Skin is warm and dry.  Psychiatric: She has a normal mood and affect. Her behavior is normal. Judgment and thought content normal.      Assessment/Plan Patient has been doing well recently.  No sign of depression or psychosis.  We discussed the ongoing utility of ECT.  We will do treatment today and see her again in 3 weeks.  Alethia Berthold, MD 12/04/2018, 3:56 PM

## 2018-12-04 NOTE — Anesthesia Procedure Notes (Signed)
Date/Time: 12/04/2018 10:25 AM Performed by: Dionne Bucy, CRNA Pre-anesthesia Checklist: Patient identified, Emergency Drugs available, Suction available and Patient being monitored Patient Re-evaluated:Patient Re-evaluated prior to induction Oxygen Delivery Method: Circle system utilized Preoxygenation: Pre-oxygenation with 100% oxygen Induction Type: IV induction Ventilation: Mask ventilation without difficulty and Mask ventilation throughout procedure Airway Equipment and Method: Bite block Placement Confirmation: positive ETCO2 Dental Injury: Teeth and Oropharynx as per pre-operative assessment

## 2018-12-04 NOTE — Discharge Instructions (Signed)
1)  The drugs that you have been given will stay in your system until tomorrow so for the       next 24 hours you should not:  A. Drive an automobile  B. Make any legal decisions  C. Drink any alcoholic beverages  2)  You may resume your regular meals upon return home.  3)  A responsible adult must take you home.  Someone should stay with you for a few          hours, then be available by phone for the remainder of the treatment day.  4)  You May experience any of the following symptoms:  Headache, Nausea and a dry mouth (due to the medications you were given),  temporary memory loss and some confusion, or sore muscles (a warm bath  should help this).  If you you experience any of these symptoms let us know on                your return visit.  5)  Report any of the following: any acute discomfort, severe headache, or temperature        greater than 100.5 F.   Also report any unusual redness, swelling, drainage, or pain         at your IV site.    You may report Symptoms to:  [place] [facility]          Phone: [phone], ECT Department           or Dr. Prescott Gum office [phone]  6)  Your next ECT Treatment is Wednesday September 30 at 8:00  We will call 2 days prior to your scheduled appointment for arrival times.  7)  Nothing to eat or drink after midnight the night before your procedure.  8)  Take morning meds  with a sip of water the morning of your procedure.  9)  Other Instructions: Call [phone] to cancel the morning of your procedure due         to illness or emergency.  10) We will call within 72 hours to assess how you are feeling.

## 2018-12-04 NOTE — Addendum Note (Signed)
Addendum  created 12/04/18 1243 by Gunnar Bulla, MD   Clinical Note Signed

## 2018-12-04 NOTE — Transfer of Care (Signed)
Immediate Anesthesia Transfer of Care Note  Patient: Jenna Dudley  Procedure(s) Performed: ECT TX  Patient Location: PACU  Anesthesia Type:General  Level of Consciousness: sedated  Airway & Oxygen Therapy: Patient Spontanous Breathing and Patient connected to face mask oxygen  Post-op Assessment: Report given to RN and Post -op Vital signs reviewed and stable  Post vital signs: Reviewed and stable  Last Vitals:  Vitals Value Taken Time  BP 140/61 12/04/18 1035  Temp 36.3 C 12/04/18 1034  Pulse 96 12/04/18 1038  Resp 23 12/04/18 1038  SpO2 100 % 12/04/18 1038  Vitals shown include unvalidated device data.  Last Pain:  Vitals:   12/04/18 0918  TempSrc: Oral  PainSc: 0-No pain         Complications: No apparent anesthesia complications

## 2018-12-04 NOTE — Procedures (Signed)
ECT SERVICES Physician's Interval Evaluation & Treatment Note  Patient Identification: Jenna Dudley MRN:  [id number] Date of Evaluation:  12/04/2018 TX #: 29  MADRS:   MMSE:   P.E. Findings:  No change to physical exam  Psychiatric Interval Note:  Mood is good be no sign of psychosis agitation or depression  Subjective:  Patient is a 83 y.o. female seen for evaluation for Electroconvulsive Therapy. No complaint  Treatment Summary:   [x]   Right Unilateral             []  Bilateral   % Energy : 0.3 ms 90%   Impedance: 1770 ohms  Seizure Energy Index: 4964 V squared  Postictal Suppression Index: 75%  Seizure Concordance Index: 87%  Medications  Pre Shock: Labetalol 20 mg Brevital 60 mg succinylcholine 80 mg  Post Shock:    Seizure Duration: 24 seconds by EMG 58 seconds by EEG   Comments: No complications.  Good-quality seizure.  Patient will be discharged home and we will see her back in 3 weeks  Lungs:  [x]   Clear to auscultation               []  Other:   Heart:    [x]   Regular rhythm             []  irregular rhythm    [x]   Previous H&P reviewed, patient examined and there are NO CHANGES                 []   Previous H&P reviewed, patient examined and there are changes noted.   Alethia Berthold, MD 9/9/20203:57 PM

## 2018-12-18 ENCOUNTER — Other Ambulatory Visit: Payer: Self-pay | Admitting: Psychiatry

## 2018-12-23 ENCOUNTER — Telehealth: Payer: Self-pay

## 2018-12-24 ENCOUNTER — Other Ambulatory Visit: Payer: Self-pay | Admitting: Psychiatry

## 2018-12-25 ENCOUNTER — Other Ambulatory Visit
Admission: RE | Admit: 2018-12-25 | Discharge: 2018-12-25 | Disposition: A | Discharge status: Home / Self Care | Payer: Medicare Other | Source: Ambulatory Visit | Attending: Psychiatry | Admitting: Psychiatry

## 2018-12-25 ENCOUNTER — Encounter: Payer: Self-pay | Admitting: Anesthesiology

## 2018-12-25 ENCOUNTER — Encounter (HOSPITAL_BASED_OUTPATIENT_CLINIC_OR_DEPARTMENT_OTHER)
Admission: RE | Admit: 2018-12-25 | Discharge: 2018-12-25 | Disposition: A | Discharge status: Home / Self Care | Payer: Medicare Other | Source: Ambulatory Visit | Attending: Psychiatry | Admitting: Psychiatry

## 2018-12-25 ENCOUNTER — Other Ambulatory Visit: Payer: Self-pay

## 2018-12-25 DIAGNOSIS — Z20828 Contact with and (suspected) exposure to other viral communicable diseases: Secondary | ICD-10-CM | POA: Diagnosis present

## 2018-12-25 DIAGNOSIS — F319 Bipolar disorder, unspecified: Secondary | ICD-10-CM | POA: Diagnosis not present

## 2018-12-25 DIAGNOSIS — F333 Major depressive disorder, recurrent, severe with psychotic symptoms: Secondary | ICD-10-CM | POA: Diagnosis not present

## 2018-12-25 LAB — SARS CORONAVIRUS 2 BY RT PCR (HOSPITAL ORDER, PERFORMED IN ~~LOC~~ HOSPITAL LAB): SARS Coronavirus 2: NEGATIVE

## 2018-12-25 MED ORDER — SODIUM CHLORIDE 0.9 % IV SOLN
500.0000 mL | Freq: Once | INTRAVENOUS | Status: AC
  Administered 2018-12-25: 09:00:00 500 mL via INTRAVENOUS

## 2018-12-25 MED ORDER — LABETALOL HCL 5 MG/ML IV SOLN
INTRAVENOUS | Status: DC | PRN
  Administered 2018-12-25: 20 mg via INTRAVENOUS

## 2018-12-25 MED ORDER — SUCCINYLCHOLINE CHLORIDE 20 MG/ML IJ SOLN
INTRAMUSCULAR | Status: AC
  Filled 2018-12-25: qty 1

## 2018-12-25 MED ORDER — SUCCINYLCHOLINE CHLORIDE 200 MG/10ML IV SOSY
PREFILLED_SYRINGE | INTRAVENOUS | Status: DC | PRN
  Administered 2018-12-25: 80 mg via INTRAVENOUS

## 2018-12-25 MED ORDER — METHOHEXITAL SODIUM 100 MG/10ML IV SOSY
PREFILLED_SYRINGE | INTRAVENOUS | Status: DC | PRN
  Administered 2018-12-25: 60 mg via INTRAVENOUS

## 2018-12-25 MED ORDER — LABETALOL HCL 5 MG/ML IV SOLN
INTRAVENOUS | Status: AC
  Filled 2018-12-25: qty 4

## 2018-12-25 NOTE — Procedures (Signed)
ECT SERVICES Physician's Interval Evaluation & Treatment Note  Patient Identification: Jenna Dudley MRN:  [id number] Date of Evaluation:  12/25/2018 TX #: 30  MADRS:   MMSE:   P.E. Findings:  No change to physical exam  Psychiatric Interval Note:  Calm stable lucid  Subjective:  Patient is a 83 y.o. female seen for evaluation for Electroconvulsive Therapy. No complaint  Treatment Summary:   [x]   Right Unilateral             []  Bilateral   % Energy : 0.3 ms 90%   Impedance: 1660 ohms  Seizure Energy Index: 4182 V squared  Postictal Suppression Index: 57%  Seizure Concordance Index: 88%  Medications  Pre Shock: Labetalol 20 mg Brevital 60 mg succinylcholine 80 mg  Post Shock:    Seizure Duration: 26 seconds EMG 49 seconds EEG   Comments: Follow-up 3 weeks  Lungs:  [x]   Clear to auscultation               []  Other:   Heart:    [x]   Regular rhythm             []  irregular rhythm    [x]   Previous H&P reviewed, patient examined and there are NO CHANGES                 []   Previous H&P reviewed, patient examined and there are changes noted.   Alethia Berthold, MD 9/30/202010:55 AM

## 2018-12-25 NOTE — Anesthesia Preprocedure Evaluation (Signed)
Anesthesia Evaluation  Patient identified by MRN, date of birth, ID band Patient awake    Reviewed: Allergy & Precautions, NPO status , Patient's Chart, lab work & pertinent test results  History of Anesthesia Complications Negative for: history of anesthetic complications  Airway Mallampati: II  TM Distance: >3 FB Neck ROM: Full    Dental  (+) Edentulous Upper, Poor Dentition   Pulmonary neg sleep apnea, neg COPD, former smoker,    breath sounds clear to auscultation- rhonchi (-) wheezing      Cardiovascular hypertension, Pt. on medications +CHF  (-) CAD, (-) Past MI, (-) Cardiac Stents and (-) CABG  Rhythm:Regular Rate:Normal - Systolic murmurs and - Diastolic murmurs Echo 04/22/17: - Left ventricle: The cavity size was normal. There was mild   concentric hypertrophy. Systolic function was normal. The   estimated ejection fraction was in the range of 55% to 60%. Wall   motion was normal; there were no regional wall motion   abnormalities. Doppler parameters are consistent with abnormal   left ventricular relaxation (grade 1 diastolic dysfunction). - Mitral valve: Calcified annulus. There was mild regurgitation.   Neuro/Psych PSYCHIATRIC DISORDERS Depression Bipolar Disorder Dementia negative neurological ROS     GI/Hepatic Neg liver ROS, GERD  ,  Endo/Other  negative endocrine ROSneg diabetes  Renal/GU negative Renal ROS     Musculoskeletal  (+) Arthritis ,   Abdominal (+) - obese,   Peds  Hematology negative hematology ROS (+)   Anesthesia Other Findings Past Medical History: 07/27/14: Bilateral carpal tunnel syndrome No date: Bipolar disorder (HCC)     Comment:  geropsych admission to [place] in Dec 2018 No date: Chronic diastolic CHF (congestive heart failure) (HCC)     Comment:  a. 03/2017 Echo: EF 55-60%, no rwma, Gr1 DD, mild MR. 07/29/14: Chronic low back pain 07/27/14: Degenerative arthritis of lumbar  spine No date: Dementia (HCC) No date: Depression 07/29/14: Esophageal spasm No date: GERD (gastroesophageal reflux disease) 07/29/14: Hyperlipemia No date: Hypertension No date: Pneumonia 07/29/14: Spinal stenosis   Reproductive/Obstetrics                             Anesthesia Physical  Anesthesia Plan  ASA: III  Anesthesia Plan: General   Post-op Pain Management:    Induction: Intravenous  PONV Risk Score and Plan: 2 and Ondansetron  Airway Management Planned: Mask  Additional Equipment:   Intra-op Plan:   Post-operative Plan:   Informed Consent: I have reviewed the patients History and Physical, chart, labs and discussed the procedure including the risks, benefits and alternatives for the proposed anesthesia with the patient or authorized representative who has indicated his/her understanding and acceptance.     Dental advisory given  Plan Discussed with: CRNA and Anesthesiologist  Anesthesia Plan Comments:         Anesthesia Quick Evaluation  

## 2018-12-25 NOTE — Transfer of Care (Signed)
Immediate Anesthesia Transfer of Care Note  Patient: Jenna Dudley  Procedure(s) Performed: ECT TX  Patient Location: PACU  Anesthesia Type:General  Level of Consciousness: awake and alert   Airway & Oxygen Therapy: Patient Spontanous Breathing  Post-op Assessment: Report given to RN and Post -op Vital signs reviewed and stable  Post vital signs: Reviewed and stable  Last Vitals:  Vitals Value Taken Time  BP 117/52 12/25/18 1115  Temp 36.1 C 12/25/18 1115  Pulse 77 12/25/18 1116  Resp 23 12/25/18 1116  SpO2 100 % 12/25/18 1116  Vitals shown include unvalidated device data.  Last Pain:  Vitals:   12/25/18 1115  TempSrc:   PainSc: Asleep         Complications: No apparent anesthesia complications

## 2018-12-25 NOTE — Anesthesia Post-op Follow-up Note (Signed)
Anesthesia QCDR form completed.        

## 2018-12-25 NOTE — Anesthesia Procedure Notes (Signed)
Date/Time: 12/25/2018 11:03 AM Performed by: Dionne Bucy, CRNA Pre-anesthesia Checklist: Patient identified, Emergency Drugs available, Suction available and Patient being monitored Patient Re-evaluated:Patient Re-evaluated prior to induction Oxygen Delivery Method: Circle system utilized Preoxygenation: Pre-oxygenation with 100% oxygen Induction Type: IV induction Ventilation: Mask ventilation without difficulty and Mask ventilation throughout procedure Airway Equipment and Method: Bite block Placement Confirmation: positive ETCO2 Dental Injury: Teeth and Oropharynx as per pre-operative assessment

## 2018-12-25 NOTE — Discharge Instructions (Signed)
1)  The drugs that you have been given will stay in your system until tomorrow so for the       next 24 hours you should not:  A. Drive an automobile  B. Make any legal decisions  C. Drink any alcoholic beverages  2)  You may resume your regular meals upon return home.  3)  A responsible adult must take you home.  Someone should stay with you for a few          hours, then be available by phone for the remainder of the treatment day.  4)  You May experience any of the following symptoms:  Headache, Nausea and a dry mouth (due to the medications you were given),  temporary memory loss and some confusion, or sore muscles (a warm bath  should help this).  If you you experience any of these symptoms let us know on                your return visit.  5)  Report any of the following: any acute discomfort, severe headache, or temperature        greater than 100.5 F.   Also report any unusual redness, swelling, drainage, or pain         at your IV site.    You may report Symptoms to:  [place] [facility]          Phone: [phone], ECT Department           or Dr. Prescott Gum office [phone]  6)  Your next ECT Treatment is Wednesday October 21 at 8:00  We will call 2 days prior to your scheduled appointment for arrival times.  7)  Nothing to eat or drink after midnight the night before your procedure.  8)  Take      With a sip of water the morning of your procedure.  9)  Other Instructions: Call [phone] to cancel the morning of your procedure due         to illness or emergency.  10) We will call within 72 hours to assess how you are feeling.

## 2018-12-25 NOTE — H&P (Signed)
Jenna Dudley is an 83 y.o. female.   Chief Complaint: Recently have been having more back pain but mentally been doing well no psychiatric complaints HPI: History of recurrent severe depression held at [facility] with medication and ECT  Past Medical History:  Diagnosis Date  . Bilateral carpal tunnel syndrome 07/27/14  . Bipolar disorder [facility])    geropsych admission to [facility] in Dec 2018  . Chronic diastolic CHF (congestive heart failure) ([place])    a. 03/2017 Echo: EF 55-60%, no rwma, Gr1 DD, mild MR.  . Chronic low back pain 07/29/14  . Degenerative arthritis of lumbar spine 07/27/14  . Dementia ([place])   . Depression   . Esophageal spasm 07/29/14  . GERD (gastroesophageal reflux disease)   . History of stress test    a. 04/2017 MV: low risk stress test.  EF 55-65%. Probable apical ant/apical, basal, and mid inflat attenuation artifact vs ischemia/scar.  . Hyperlipemia 07/29/14  . Hypertension   . Pneumonia   . Spinal stenosis 07/29/14    Past Surgical History:  Procedure Laterality Date  . HIP SURGERY Right   . INNER EAR SURGERY Right   . REPLACEMENT TOTAL KNEE BILATERAL Bilateral 07/27/14    Family History  Problem Relation Age of Onset  . Hypertension Mother   . Stroke Mother    Social History:  reports that she quit smoking about 43 years ago. Her smoking use included cigarettes. She has never used smokeless tobacco. She reports that she does not drink alcohol or use drugs.  Allergies:  Allergies  Allergen Reactions  . Aspirin Other (See Comments)    Unknown reaction  . Sulfa Antibiotics Other (See Comments)    Unknown reaction    (Not in a hospital admission)   Results for orders placed or performed during the hospital encounter of 12/25/18 (from the past 48 hour(s))  SARS Coronavirus 2 [facility] order, Performed in [facility] hospital lab) Nasopharyngeal Nasopharyngeal Swab     Status: None   Collection Time: 12/25/18  8:24 AM   Specimen: Nasopharyngeal Swab  Result Value  Ref Range   SARS Coronavirus 2 NEGATIVE NEGATIVE    Comment: (NOTE) If result is NEGATIVE SARS-CoV-2 target nucleic acids are NOT DETECTED. The SARS-CoV-2 RNA is generally detectable in upper and lower  respiratory specimens during the acute phase of infection. The lowest  concentration of SARS-CoV-2 viral copies this assay can detect is 250  copies / mL. A negative result does not preclude SARS-CoV-2 infection  and should not be used as the sole basis for treatment or other  patient management decisions.  A negative result may occur with  improper specimen collection / handling, submission of specimen other  than nasopharyngeal swab, presence of viral mutation(s) within the  areas targeted by this assay, and inadequate number of viral copies  (<250 copies / mL). A negative result must be combined with clinical  observations, patient history, and epidemiological information. If result is POSITIVE SARS-CoV-2 target nucleic acids are DETECTED. The SARS-CoV-2 RNA is generally detectable in upper and lower  respiratory specimens dur ing the acute phase of infection.  Positive  results are indicative of active infection with SARS-CoV-2.  Clinical  correlation with patient history and other diagnostic information is  necessary to determine patient infection status.  Positive results do  not rule out bacterial infection or co-infection with other viruses. If result is PRESUMPTIVE POSTIVE SARS-CoV-2 nucleic acids MAY BE PRESENT.   A presumptive positive result was obtained on  the submitted specimen  and confirmed on repeat testing.  While 2019 novel coronavirus  (SARS-CoV-2) nucleic acids may be present in the submitted sample  additional confirmatory testing may be necessary for epidemiological  and / or clinical management purposes  to differentiate between  SARS-CoV-2 and other Sarbecovirus currently known to infect humans.  If clinically indicated additional testing with an alternate  test  methodology [phone]) is advised. The SARS-CoV-2 RNA is generally  detectable in upper and lower respiratory sp ecimens during the acute  phase of infection. The expected result is Negative. Fact Sheet for Patients:  BoilerBrush.com.cy Fact Sheet for Healthcare Providers: https://pope.com/ This test is not yet approved or cleared by the [place] FDA and has been authorized for detection and/or diagnosis of SARS-CoV-2 by FDA under an Emergency Use Authorization (EUA).  This EUA will remain in effect (meaning this test can be used) for the duration of the COVID-19 declaration under Section 564(b)(1) of the Act, 21 U.S.C. section 360bbb-3(b)(1), unless the authorization is terminated or revoked sooner. Performed at [facility], [address] [place], [place] [address]    No results found.  Review of Systems  Constitutional: Negative.   HENT: Negative.   Eyes: Negative.   Respiratory: Negative.   Cardiovascular: Negative.   Gastrointestinal: Negative.   Musculoskeletal: Negative.   Skin: Negative.   Neurological: Negative.   Psychiatric/Behavioral: Negative.     Blood pressure (!) 160/72, pulse 92, temperature 98 F (36.7 C), temperature source Oral, resp. rate 18, height 5' (1.524 m), weight 74.4 kg, SpO2 98 %. Physical Exam  Nursing note and vitals reviewed. Constitutional: She appears well-developed and well-nourished.  HENT:  Head: Normocephalic and atraumatic.  Eyes: Pupils are equal, round, and reactive to light. Conjunctivae are normal.  Neck: Normal range of motion.  Cardiovascular: Regular rhythm and normal heart sounds.  Respiratory: Effort normal. No respiratory distress.  GI: Soft.  Musculoskeletal: Normal range of motion.  Neurological: She is alert.  Skin: Skin is warm and dry.  Psychiatric: She has a normal mood and affect. Her behavior is normal. Judgment and thought content normal.      Assessment/Plan ECT right unilateral as usual today follow-up in 3 weeks  Mordecai Rasmussen, MD 12/25/2018, 10:53 AM

## 2018-12-27 NOTE — Anesthesia Postprocedure Evaluation (Signed)
Anesthesia Post Note  Patient: Jenna Dudley  Procedure(s) Performed: ECT TX  Patient location during evaluation: PACU Anesthesia Type: General Level of consciousness: awake and alert Pain management: pain level controlled Vital Signs Assessment: post-procedure vital signs reviewed and stable Respiratory status: spontaneous breathing, nonlabored ventilation and respiratory function stable Cardiovascular status: blood pressure returned to baseline and stable Postop Assessment: no signs of nausea or vomiting Anesthetic complications: no     Last Vitals:  Vitals:   12/25/18 1145 12/25/18 1156  BP:  (!) 153/62  Pulse: 82 88  Resp: (!) 23 16  Temp:    SpO2: 96% 99%    Last Pain:  Vitals:   12/25/18 1142  TempSrc:   PainSc: 0-No pain                 Kalei Meda

## 2019-01-06 ENCOUNTER — Telehealth: Payer: Self-pay

## 2019-01-09 ENCOUNTER — Other Ambulatory Visit: Payer: Self-pay

## 2019-01-09 MED ORDER — FUROSEMIDE 20 MG PO TABS: 20.0000 mg | ORAL_TABLET | Freq: Every day | ORAL | 0 refills | Status: DC

## 2019-01-13 ENCOUNTER — Other Ambulatory Visit: Payer: Self-pay | Admitting: Psychiatry

## 2019-01-14 ENCOUNTER — Other Ambulatory Visit
Admission: RE | Admit: 2019-01-14 | Discharge: 2019-01-14 | Disposition: A | Discharge status: Home / Self Care | Payer: Medicare Other | Source: Ambulatory Visit | Attending: Psychiatry | Admitting: Psychiatry

## 2019-01-14 ENCOUNTER — Other Ambulatory Visit: Payer: Self-pay

## 2019-01-14 ENCOUNTER — Other Ambulatory Visit: Payer: Self-pay | Admitting: Psychiatry

## 2019-01-14 DIAGNOSIS — Z01812 Encounter for preprocedural laboratory examination: Secondary | ICD-10-CM | POA: Insufficient documentation

## 2019-01-14 DIAGNOSIS — Z20828 Contact with and (suspected) exposure to other viral communicable diseases: Secondary | ICD-10-CM | POA: Insufficient documentation

## 2019-01-14 LAB — SARS CORONAVIRUS 2 (TAT 6-24 HRS): SARS Coronavirus 2: NEGATIVE

## 2019-01-15 ENCOUNTER — Encounter
Admission: RE | Admit: 2019-01-15 | Discharge: 2019-01-15 | Disposition: A | Discharge status: Home / Self Care | Payer: Medicare Other | Source: Ambulatory Visit | Attending: Psychiatry | Admitting: Psychiatry

## 2019-01-15 ENCOUNTER — Encounter: Payer: Self-pay | Admitting: Anesthesiology

## 2019-01-15 DIAGNOSIS — E785 Hyperlipidemia, unspecified: Secondary | ICD-10-CM | POA: Insufficient documentation

## 2019-01-15 DIAGNOSIS — F332 Major depressive disorder, 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 features: Secondary | ICD-10-CM | POA: Diagnosis not present

## 2019-01-15 DIAGNOSIS — Z20828 Contact with and (suspected) exposure to other viral communicable diseases: Secondary | ICD-10-CM | POA: Diagnosis not present

## 2019-01-15 DIAGNOSIS — F0390 Unspecified dementia without behavioral disturbance: Secondary | ICD-10-CM | POA: Insufficient documentation

## 2019-01-15 DIAGNOSIS — I5032 Chronic diastolic (congestive) heart failure: Secondary | ICD-10-CM | POA: Insufficient documentation

## 2019-01-15 DIAGNOSIS — I110 Hypertensive heart disease with heart failure: Secondary | ICD-10-CM | POA: Insufficient documentation

## 2019-01-15 DIAGNOSIS — F329 Major depressive disorder, single episode, unspecified: Secondary | ICD-10-CM | POA: Insufficient documentation

## 2019-01-15 DIAGNOSIS — Z87891 Personal history of nicotine dependence: Secondary | ICD-10-CM | POA: Insufficient documentation

## 2019-01-15 MED ORDER — ONDANSETRON HCL 4 MG/2ML IJ SOLN: 4.0000 mg | Freq: Once | INTRAMUSCULAR | Status: DC | PRN

## 2019-01-15 MED ORDER — FENTANYL CITRATE (PF) 100 MCG/2ML IJ SOLN: 25.0000 ug | INTRAMUSCULAR | Status: DC | PRN

## 2019-01-15 MED ORDER — LABETALOL HCL 5 MG/ML IV SOLN
INTRAVENOUS | Status: DC | PRN
  Administered 2019-01-15: 20 mg via INTRAVENOUS

## 2019-01-15 MED ORDER — SUCCINYLCHOLINE CHLORIDE 20 MG/ML IJ SOLN
INTRAMUSCULAR | Status: AC
  Filled 2019-01-15: qty 1

## 2019-01-15 MED ORDER — SODIUM CHLORIDE 0.9 % IV SOLN
INTRAVENOUS | Status: DC | PRN
  Administered 2019-01-15: 11:00:00 via INTRAVENOUS

## 2019-01-15 MED ORDER — METHOHEXITAL SODIUM 100 MG/10ML IV SOSY
PREFILLED_SYRINGE | INTRAVENOUS | Status: DC | PRN
  Administered 2019-01-15: 60 mg via INTRAVENOUS

## 2019-01-15 MED ORDER — SUCCINYLCHOLINE CHLORIDE 200 MG/10ML IV SOSY
PREFILLED_SYRINGE | INTRAVENOUS | Status: DC | PRN
  Administered 2019-01-15: 80 mg via INTRAVENOUS

## 2019-01-15 MED ORDER — KETOROLAC TROMETHAMINE 30 MG/ML IJ SOLN
30.0000 mg | Freq: Once | INTRAMUSCULAR | Status: AC
  Administered 2019-01-15: 30 mg via INTRAVENOUS

## 2019-01-15 MED ORDER — SODIUM CHLORIDE 0.9 % IV SOLN
500.0000 mL | Freq: Once | INTRAVENOUS | Status: AC
  Administered 2019-01-15: 09:00:00 500 mL via INTRAVENOUS

## 2019-01-15 MED ORDER — LABETALOL HCL 5 MG/ML IV SOLN
INTRAVENOUS | Status: AC
  Filled 2019-01-15: qty 4

## 2019-01-15 MED ORDER — KETOROLAC TROMETHAMINE 30 MG/ML IJ SOLN
INTRAMUSCULAR | Status: AC
  Filled 2019-01-15: qty 1

## 2019-01-15 NOTE — Anesthesia Post-op Follow-up Note (Signed)
Anesthesia QCDR form completed.        

## 2019-01-15 NOTE — Procedures (Signed)
ECT SERVICES Physician's Interval Evaluation & Treatment Note  Patient Identification: Jenna Dudley MRN:  [id number] Date of Evaluation:  01/15/2019 TX #: 31  MADRS:   MMSE:   P.E. Findings:  No change to physical exam  Psychiatric Interval Note:  Mood good no suicidal ideation no psychosis  Subjective:  Patient is a 83 y.o. female seen for evaluation for Electroconvulsive Therapy. No specific complaint  Treatment Summary:   [x]   Right Unilateral             []  Bilateral   % Energy : 0.3 ms 90%   Impedance: 2700 ohms  Seizure Energy Index: 4155 V squared  Postictal Suppression Index: 56%  Seizure Concordance Index: 75%  Medications  Pre Shock: Labetalol 20 mg Brevital 60 mg succinylcholine 80 mg  Post Shock:    Seizure Duration: 15 seconds EMG 37 seconds EEG   Comments: Follow-up 3 weeks  Lungs:  []   Clear to auscultation               []  Other:   Heart:    []   Regular rhythm             []  irregular rhythm    []   Previous H&P reviewed, patient examined and there are NO CHANGES                 []   Previous H&P reviewed, patient examined and there are changes noted.   Alethia Berthold, MD 10/21/20205:27 PM

## 2019-01-15 NOTE — Transfer of Care (Signed)
Immediate Anesthesia Transfer of Care Note  Patient: Jenna Dudley  Procedure(s) Performed: ECT TX  Patient Location: PACU  Anesthesia Type:General  Level of Consciousness: sedated  Airway & Oxygen Therapy: Patient Spontanous Breathing and Patient connected to face mask oxygen  Post-op Assessment: Report given to RN and Post -op Vital signs reviewed and stable  Post vital signs: Reviewed and stable  Last Vitals:  Vitals Value Taken Time  BP 133/62 01/15/19 1101  Temp 36.3 C 01/15/19 1059  Pulse 83 01/15/19 1103  Resp 33 01/15/19 1103  SpO2 100 % 01/15/19 1103  Vitals shown include unvalidated device data.  Last Pain:  Vitals:   01/15/19 1059  TempSrc:   PainSc: Asleep         Complications: No apparent anesthesia complications

## 2019-01-15 NOTE — Anesthesia Procedure Notes (Signed)
Date/Time: 01/15/2019 10:49 AM Performed by: Dionne Bucy, CRNA Pre-anesthesia Checklist: Patient identified, Emergency Drugs available, Suction available and Patient being monitored Patient Re-evaluated:Patient Re-evaluated prior to induction Oxygen Delivery Method: Circle system utilized Preoxygenation: Pre-oxygenation with 100% oxygen Induction Type: IV induction Ventilation: Mask ventilation without difficulty and Mask ventilation throughout procedure Airway Equipment and Method: Bite block Placement Confirmation: positive ETCO2 Dental Injury: Teeth and Oropharynx as per pre-operative assessment

## 2019-01-15 NOTE — Anesthesia Preprocedure Evaluation (Signed)
Anesthesia Evaluation  Patient identified by MRN, date of birth, ID band Patient awake    Reviewed: Allergy & Precautions, NPO status , Patient's Chart, lab work & pertinent test results  History of Anesthesia Complications Negative for: history of anesthetic complications  Airway Mallampati: II  TM Distance: >3 FB Neck ROM: Full    Dental  (+) Edentulous Upper, Poor Dentition   Pulmonary neg sleep apnea, neg COPD, former smoker,    breath sounds clear to auscultation- rhonchi (-) wheezing      Cardiovascular hypertension, Pt. on medications +CHF  (-) CAD, (-) Past MI, (-) Cardiac Stents and (-) CABG  Rhythm:Regular Rate:Normal - Systolic murmurs and - Diastolic murmurs Echo 04/22/17: - Left ventricle: The cavity size was normal. There was mild   concentric hypertrophy. Systolic function was normal. The   estimated ejection fraction was in the range of 55% to 60%. Wall   motion was normal; there were no regional wall motion   abnormalities. Doppler parameters are consistent with abnormal   left ventricular relaxation (grade 1 diastolic dysfunction). - Mitral valve: Calcified annulus. There was mild regurgitation.   Neuro/Psych PSYCHIATRIC DISORDERS Depression Bipolar Disorder Dementia negative neurological ROS     GI/Hepatic Neg liver ROS, GERD  ,  Endo/Other  negative endocrine ROSneg diabetes  Renal/GU negative Renal ROS     Musculoskeletal  (+) Arthritis ,   Abdominal (+) - obese,   Peds  Hematology negative hematology ROS (+)   Anesthesia Other Findings Past Medical History: 07/27/14: Bilateral carpal tunnel syndrome No date: Bipolar disorder (HCC)     Comment:  geropsych admission to [place] in Dec 2018 No date: Chronic diastolic CHF (congestive heart failure) (HCC)     Comment:  a. 03/2017 Echo: EF 55-60%, no rwma, Gr1 DD, mild MR. 07/29/14: Chronic low back pain 07/27/14: Degenerative arthritis of lumbar  spine No date: Dementia (HCC) No date: Depression 07/29/14: Esophageal spasm No date: GERD (gastroesophageal reflux disease) 07/29/14: Hyperlipemia No date: Hypertension No date: Pneumonia 07/29/14: Spinal stenosis   Reproductive/Obstetrics                             Anesthesia Physical  Anesthesia Plan  ASA: III  Anesthesia Plan: General   Post-op Pain Management:    Induction: Intravenous  PONV Risk Score and Plan: 2 and Ondansetron  Airway Management Planned: Mask  Additional Equipment:   Intra-op Plan:   Post-operative Plan:   Informed Consent: I have reviewed the patients History and Physical, chart, labs and discussed the procedure including the risks, benefits and alternatives for the proposed anesthesia with the patient or authorized representative who has indicated his/her understanding and acceptance.     Dental advisory given  Plan Discussed with: CRNA and Anesthesiologist  Anesthesia Plan Comments:         Anesthesia Quick Evaluation  

## 2019-01-15 NOTE — Anesthesia Postprocedure Evaluation (Signed)
Anesthesia Post Note  Patient: Jenna Dudley  Procedure(s) Performed: ECT TX  Patient location during evaluation: PACU Anesthesia Type: General Level of consciousness: awake and alert and oriented Pain management: pain level controlled Vital Signs Assessment: post-procedure vital signs reviewed and stable Respiratory status: spontaneous breathing Cardiovascular status: blood pressure returned to baseline Anesthetic complications: no     Last Vitals:  Vitals:   01/15/19 1131 01/15/19 1146  BP: 128/63 129/72  Pulse: 74 70  Resp: (!) 29 (!) 22  Temp:  36.6 C  SpO2: 97%     Last Pain:  Vitals:   01/15/19 1146  TempSrc: Oral  PainSc: 0-No pain                 Rowynn Mcweeney

## 2019-01-15 NOTE — H&P (Signed)
Jenna Dudley is an 83 y.o. female.   Chief Complaint: mood stable HPI: chronic depression  Past Medical History:  Diagnosis Date  . Bilateral carpal tunnel syndrome 07/27/14  . Bipolar disorder [facility]    geropsych admission to [facility] in Dec 2018  . Chronic diastolic CHF (congestive heart failure) ([place])    a. 03/2017 Echo: EF 55-60%, no rwma, Gr1 DD, mild MR.  . Chronic low back pain 07/29/14  . Degenerative arthritis of lumbar spine 07/27/14  . Dementia ([place])   . Depression   . Esophageal spasm 07/29/14  . GERD (gastroesophageal reflux disease)   . History of stress test    a. 04/2017 MV: low risk stress test.  EF 55-65%. Probable apical ant/apical, basal, and mid inflat attenuation artifact vs ischemia/scar.  . Hyperlipemia 07/29/14  . Hypertension   . Pneumonia   . Spinal stenosis 07/29/14    Past Surgical History:  Procedure Laterality Date  . HIP SURGERY Right   . INNER EAR SURGERY Right   . REPLACEMENT TOTAL KNEE BILATERAL Bilateral 07/27/14    Family History  Problem Relation Age of Onset  . Hypertension Mother   . Stroke Mother    Social History:  reports that she quit smoking about 43 years ago. Her smoking use included cigarettes. She has never used smokeless tobacco. She reports that she does not drink alcohol or use drugs.  Allergies:  Allergies  Allergen Reactions  . Aspirin Other (See Comments)    Unknown reaction  . Sulfa Antibiotics Other (See Comments)    Unknown reaction    (Not in a hospital admission)   Results for orders placed or performed during the hospital encounter of 01/14/19 (from the past 48 hour(s))  SARS CORONAVIRUS 2 (TAT 6-24 HRS) Nasopharyngeal Nasopharyngeal Swab     Status: None   Collection Time: 01/14/19  9:35 AM   Specimen: Nasopharyngeal Swab  Result Value Ref Range   SARS Coronavirus 2 NEGATIVE NEGATIVE    Comment: (NOTE) SARS-CoV-2 target nucleic acids are NOT DETECTED. The SARS-CoV-2 RNA is generally detectable in upper and  lower respiratory specimens during the acute phase of infection. Negative results do not preclude SARS-CoV-2 infection, do not rule out co-infections with other pathogens, and should not be used as the sole basis for treatment or other patient management decisions. Negative results must be combined with clinical observations, patient history, and epidemiological information. The expected result is Negative. Fact Sheet for Patients: SugarRoll.be Fact Sheet for Healthcare Providers: https://www.woods-mathews.com/ This test is not yet approved or cleared by the [place] FDA and  has been authorized for detection and/or diagnosis of SARS-CoV-2 by FDA under an Emergency Use Authorization (EUA). This EUA will remain  in effect (meaning this test can be used) for the duration of the COVID-19 declaration under Section 56 4(b)(1) of the Act, 21 U.S.C. section 360bbb-3(b)(1), unless the authorization is terminated or revoked sooner. Performed at [facility], [facility] [address] [place], [place] [address]    No results found.  Review of Systems  Constitutional: Negative.   HENT: Negative.   Eyes: Negative.   Respiratory: Negative.   Cardiovascular: Negative.   Gastrointestinal: Negative.   Musculoskeletal: Negative.   Skin: Negative.   Neurological: Negative.   Psychiatric/Behavioral: Negative.     Blood pressure (!) 150/72, pulse 89, temperature 97.7 F (36.5 C), temperature source Oral, resp. rate 20, SpO2 100 %. Physical Exam  Nursing note and vitals reviewed. Constitutional: She appears well-developed and well-nourished.  HENT:  Head: Normocephalic and atraumatic.  Eyes: Pupils are equal, round, and reactive to light. Conjunctivae are normal.  Neck: Normal range of motion.  Cardiovascular: Regular rhythm and normal heart sounds.  Respiratory: Effort normal. No respiratory distress.  GI: Soft.  Musculoskeletal: Normal range of  motion.  Neurological: She is alert.  Skin: Skin is warm and dry.  Psychiatric: She has a normal mood and affect. Her behavior is normal. Judgment and thought content normal.     Assessment/Plan Treatment and follow up 3 weeks  Mordecai Rasmussen, MD 01/15/2019, 9:48 AM

## 2019-01-15 NOTE — Discharge Instructions (Signed)
1)  The drugs that you have been given will stay in your system until tomorrow so for the       next 24 hours you should not:  A. Drive an automobile  B. Make any legal decisions  C. Drink any alcoholic beverages  2)  You may resume your regular meals upon return home.  3)  A responsible adult must take you home.  Someone should stay with you for a few          hours, then be available by phone for the remainder of the treatment day.  4)  You May experience any of the following symptoms:  Headache, Nausea and a dry mouth (due to the medications you were given),  temporary memory loss and some confusion, or sore muscles (a warm bath  should help this).  If you you experience any of these symptoms let us know on                your return visit.  5)  Report any of the following: any acute discomfort, severe headache, or temperature        greater than 100.5 F.   Also report any unusual redness, swelling, drainage, or pain         at your IV site.    You may report Symptoms to:  [place] at [facility]          Phone: [phone], ECT Department           or Dr. Prescott Gum office [phone]  6)  Your next ECT Treatment is Wednesday November 11  We will call 2 days prior to your scheduled appointment for arrival times.  7)  Nothing to eat or drink after midnight the night before your procedure.  8)  Take      With a sip of water the morning of your procedure.  9)  Other Instructions: Call [phone] to cancel the morning of your procedure due         to illness or emergency.  10) We will call within 72 hours to assess how you are feeling.

## 2019-01-20 ENCOUNTER — Other Ambulatory Visit: Payer: Self-pay | Admitting: Cardiovascular Disease

## 2019-01-27 ENCOUNTER — Telehealth: Payer: Self-pay

## 2019-02-03 ENCOUNTER — Other Ambulatory Visit
Admission: RE | Admit: 2019-02-03 | Discharge: 2019-02-03 | Disposition: A | Discharge status: Home / Self Care | Payer: Medicare Other | Source: Ambulatory Visit | Attending: Psychiatry | Admitting: Psychiatry

## 2019-02-03 DIAGNOSIS — Z01812 Encounter for preprocedural laboratory examination: Secondary | ICD-10-CM | POA: Insufficient documentation

## 2019-02-03 DIAGNOSIS — Z20828 Contact with and (suspected) exposure to other viral communicable diseases: Secondary | ICD-10-CM | POA: Diagnosis not present

## 2019-02-03 LAB — SARS CORONAVIRUS 2 (TAT 6-24 HRS): SARS Coronavirus 2: NEGATIVE

## 2019-02-04 ENCOUNTER — Other Ambulatory Visit: Payer: Self-pay | Admitting: Psychiatry

## 2019-02-05 ENCOUNTER — Encounter: Payer: Self-pay | Admitting: Registered Nurse

## 2019-02-05 ENCOUNTER — Encounter
Admission: RE | Admit: 2019-02-05 | Discharge: 2019-02-05 | Disposition: A | Discharge status: Home / Self Care | Payer: Medicare Other | Source: Ambulatory Visit | Attending: Psychiatry | Admitting: Psychiatry

## 2019-02-05 ENCOUNTER — Other Ambulatory Visit: Payer: Self-pay

## 2019-02-05 DIAGNOSIS — F0390 Unspecified dementia without behavioral disturbance: Secondary | ICD-10-CM | POA: Diagnosis not present

## 2019-02-05 DIAGNOSIS — Z87891 Personal history of nicotine dependence: Secondary | ICD-10-CM | POA: Insufficient documentation

## 2019-02-05 DIAGNOSIS — I5032 Chronic diastolic (congestive) heart failure: Secondary | ICD-10-CM | POA: Diagnosis not present

## 2019-02-05 DIAGNOSIS — I110 Hypertensive heart disease with heart failure: Secondary | ICD-10-CM | POA: Insufficient documentation

## 2019-02-05 DIAGNOSIS — F332 Major depressive disorder, 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 features: Secondary | ICD-10-CM | POA: Diagnosis not present

## 2019-02-05 DIAGNOSIS — Z882 Allergy status to sulfonamides status: Secondary | ICD-10-CM | POA: Insufficient documentation

## 2019-02-05 DIAGNOSIS — K219 Gastro-esophageal reflux disease without esophagitis: Secondary | ICD-10-CM | POA: Insufficient documentation

## 2019-02-05 DIAGNOSIS — F319 Bipolar disorder, unspecified: Secondary | ICD-10-CM | POA: Insufficient documentation

## 2019-02-05 MED ORDER — METHOHEXITAL SODIUM 100 MG/10ML IV SOSY
PREFILLED_SYRINGE | INTRAVENOUS | Status: DC | PRN
  Administered 2019-02-05: 60 mg via INTRAVENOUS

## 2019-02-05 MED ORDER — KETOROLAC TROMETHAMINE 30 MG/ML IJ SOLN
30.0000 mg | Freq: Once | INTRAMUSCULAR | Status: AC
  Administered 2019-02-05: 12:00:00 30 mg via INTRAVENOUS

## 2019-02-05 MED ORDER — SODIUM CHLORIDE 0.9 % IV SOLN
INTRAVENOUS | Status: DC | PRN
  Administered 2019-02-05: 10:00:00 via INTRAVENOUS

## 2019-02-05 MED ORDER — KETOROLAC TROMETHAMINE 30 MG/ML IJ SOLN
INTRAMUSCULAR | Status: AC
  Administered 2019-02-05: 30 mg via INTRAVENOUS
  Filled 2019-02-05: qty 1

## 2019-02-05 MED ORDER — METHOHEXITAL SODIUM 0.5 G IJ SOLR
INTRAMUSCULAR | Status: AC
  Filled 2019-02-05: qty 500

## 2019-02-05 MED ORDER — SUCCINYLCHOLINE CHLORIDE 20 MG/ML IJ SOLN
INTRAMUSCULAR | Status: DC | PRN
  Administered 2019-02-05: 80 mg via INTRAVENOUS

## 2019-02-05 MED ORDER — SODIUM CHLORIDE 0.9 % IV SOLN: 500.0000 mL | Freq: Once | INTRAVENOUS | Status: DC

## 2019-02-05 MED ORDER — LABETALOL HCL 5 MG/ML IV SOLN
INTRAVENOUS | Status: DC | PRN
  Administered 2019-02-05: 20 mg via INTRAVENOUS

## 2019-02-05 NOTE — Anesthesia Postprocedure Evaluation (Signed)
Anesthesia Post Note  Patient: Jenna Dudley  Procedure(s) Performed: ECT TX  Patient location during evaluation: PACU Anesthesia Type: General Level of consciousness: awake and alert Pain management: pain level controlled Vital Signs Assessment: post-procedure vital signs reviewed and stable Respiratory status: spontaneous breathing, nonlabored ventilation and respiratory function stable Cardiovascular status: blood pressure returned to baseline and stable Postop Assessment: no apparent nausea or vomiting Anesthetic complications: no     Last Vitals:  Vitals:   02/05/19 1116 02/05/19 1129  BP: 133/62 (!) 108/93  Pulse: 88 88  Resp: 16 16  Temp:    SpO2: 94% 95%    Last Pain:  Vitals:   02/05/19 1129  TempSrc:   PainSc: [place]

## 2019-02-05 NOTE — Transfer of Care (Signed)
Immediate Anesthesia Transfer of Care Note  Patient: Jenna Dudley  Procedure(s) Performed: ECT TX  Patient Location: PACU  Anesthesia Type:General  Level of Consciousness: sedated  Airway & Oxygen Therapy: Patient Spontanous Breathing and Patient connected to face mask oxygen  Post-op Assessment: Report given to RN and Post -op Vital signs reviewed and stable  Post vital signs: Reviewed and stable  Last Vitals:  Vitals Value Taken Time  BP 169/72 02/05/19 1057  Temp 36.8 C 02/05/19 1057  Pulse 89 02/05/19 1057  Resp 18 02/05/19 1057  SpO2 100 % 02/05/19 1057    Last Pain:  Vitals:   02/05/19 1057  TempSrc: Tympanic  PainSc: Asleep         Complications: No apparent anesthesia complications

## 2019-02-05 NOTE — Anesthesia Procedure Notes (Signed)
Performed by: Madonna Flegal, CRNA Pre-anesthesia Checklist: Patient identified, Emergency Drugs available, Suction available and Patient being monitored Patient Re-evaluated:Patient Re-evaluated prior to induction Oxygen Delivery Method: Circle system utilized Preoxygenation: Pre-oxygenation with 100% oxygen Induction Type: IV induction Ventilation: Mask ventilation without difficulty and Mask ventilation throughout procedure Airway Equipment and Method: Bite block Placement Confirmation: positive ETCO2 Dental Injury: Teeth and Oropharynx as per pre-operative assessment        

## 2019-02-05 NOTE — Anesthesia Post-op Follow-up Note (Signed)
Anesthesia QCDR form completed.        

## 2019-02-05 NOTE — H&P (Signed)
Jenna Dudley is an 83 y.o. female.   Chief Complaint: Patient seems a little bit more anxious this time than before but not to the degree that is very concerning.  She of course has chronic back pain but no other new complaint. HPI: History of recurrent psychotic depression  Past Medical History:  Diagnosis Date  . Bilateral carpal tunnel syndrome 07/27/14  . Bipolar disorder [facility])    geropsych admission to [facility] in Dec 2018  . Chronic diastolic CHF (congestive heart failure) ([place])    a. 03/2017 Echo: EF 55-60%, no rwma, Gr1 DD, mild MR.  . Chronic low back pain 07/29/14  . Degenerative arthritis of lumbar spine 07/27/14  . Dementia ([place])   . Depression   . Esophageal spasm 07/29/14  . GERD (gastroesophageal reflux disease)   . History of stress test    a. 04/2017 MV: low risk stress test.  EF 55-65%. Probable apical ant/apical, basal, and mid inflat attenuation artifact vs ischemia/scar.  . Hyperlipemia 07/29/14  . Hypertension   . Pneumonia   . Spinal stenosis 07/29/14    Past Surgical History:  Procedure Laterality Date  . HIP SURGERY Right   . INNER EAR SURGERY Right   . REPLACEMENT TOTAL KNEE BILATERAL Bilateral 07/27/14    Family History  Problem Relation Age of Onset  . Hypertension Mother   . Stroke Mother    Social History:  reports that she quit smoking about 43 years ago. Her smoking use included cigarettes. She has never used smokeless tobacco. She reports that she does not drink alcohol or use drugs.  Allergies:  Allergies  Allergen Reactions  . Aspirin Other (See Comments)    Unknown reaction  . Sulfa Antibiotics Other (See Comments)    Unknown reaction    (Not in a hospital admission)   No results found for this or any previous visit (from the past 48 hour(s)). No results found.  Review of Systems  Constitutional: Negative.   HENT: Negative.   Eyes: Negative.   Respiratory: Negative.   Cardiovascular: Negative.   Gastrointestinal: Negative.    Musculoskeletal: Negative.   Skin: Negative.   Neurological: Negative.   Psychiatric/Behavioral: Positive for memory loss. Negative for depression, hallucinations, substance abuse and suicidal ideas. The patient is nervous/anxious. The patient does not have insomnia.     There were no vitals taken for this visit. Physical Exam  Nursing note and vitals reviewed. Constitutional: She appears well-developed and well-nourished.  HENT:  Head: Normocephalic and atraumatic.  Eyes: Pupils are equal, round, and reactive to light. Conjunctivae are normal.  Neck: Normal range of motion.  Cardiovascular: Regular rhythm and normal heart sounds.  Respiratory: Effort normal.  GI: Soft.  Musculoskeletal: Normal range of motion.  Neurological: She is alert.  Skin: Skin is warm and dry.  Psychiatric: Her speech is normal and behavior is normal. Judgment and thought content normal. Her mood appears anxious. She exhibits abnormal recent memory and abnormal remote memory.     Assessment/Plan As the patient has gotten older her baseline cognitive function has gotten worse.  I do not think that the ECT is in any way causing her making this worse but we are taking it into account on monitoring her.  ECT does still appear to be playing vital function and keeping her out of psychosis and depression  Alethia Berthold, MD 02/05/2019, 10:40 AM

## 2019-02-05 NOTE — Anesthesia Preprocedure Evaluation (Signed)
Anesthesia Evaluation  Patient identified by MRN, date of birth, ID band Patient awake    Reviewed: Allergy & Precautions, H&P , NPO status , Patient's Chart, lab work & pertinent test results  Airway Mallampati: II  TM Distance: >3 FB Neck ROM: full    Dental  (+) Missing, Edentulous Upper   Pulmonary shortness of breath and with exertion, former smoker,           Cardiovascular hypertension, + CAD and +CHF (HFpEF)  (-) dysrhythmias      Neuro/Psych PSYCHIATRIC DISORDERS Depression Bipolar Disorder negative neurological ROS     GI/Hepatic Neg liver ROS, GERD  Controlled,  Endo/Other  negative endocrine ROS  Renal/GU negative Renal ROS  negative genitourinary   Musculoskeletal   Abdominal   Peds  Hematology negative hematology ROS (+)   Anesthesia Other Findings Past Medical History: 07/27/14: Bilateral carpal tunnel syndrome No date: Bipolar disorder ([place])     Comment:  geropsych admission to [facility] in Dec 2018 No date: Chronic diastolic CHF (congestive heart failure) ([place])     Comment:  a. 03/2017 Echo: EF 55-60%, no rwma, Gr1 DD, mild MR. 07/29/14: Chronic low back pain 07/27/14: Degenerative arthritis of lumbar spine No date: Dementia ([place]) No date: Depression 07/29/14: Esophageal spasm No date: GERD (gastroesophageal reflux disease) No date: History of stress test     Comment:  a. 04/2017 MV: low risk stress test.  EF 55-65%. Probable              apical ant/apical, basal, and mid inflat attenuation               artifact vs ischemia/scar. 07/29/14: Hyperlipemia No date: Hypertension No date: Pneumonia 07/29/14: Spinal stenosis  Past Surgical History: No date: HIP SURGERY; Right No date: INNER EAR SURGERY; Right 07/27/14: REPLACEMENT TOTAL KNEE BILATERAL; Bilateral     Reproductive/Obstetrics negative OB ROS                             Anesthesia Physical Anesthesia Plan  ASA:  III  Anesthesia Plan: General   Post-op Pain Management:    Induction:   PONV Risk Score and Plan:   Airway Management Planned: Mask  Additional Equipment:   Intra-op Plan:   Post-operative Plan:   Informed Consent: I have reviewed the patients History and Physical, chart, labs and discussed the procedure including the risks, benefits and alternatives for the proposed anesthesia with the patient or authorized representative who has indicated his/her understanding and acceptance.     Dental Advisory Given  Plan Discussed with: Anesthesiologist, CRNA and Surgeon  Anesthesia Plan Comments:         Anesthesia Quick Evaluation

## 2019-02-05 NOTE — Procedures (Signed)
ECT SERVICES Physician's Interval Evaluation & Treatment Note  Patient Identification: Jenna Dudley MRN:  [id number] Date of Evaluation:  02/05/2019 TX #: [id number]  MADRS:   MMSE:   P.E. Findings:  No change physical exam  Psychiatric Interval Note:  Gradually showing more signs of cognitive decline.  A little anxious but not showing signs of severe depression  Subjective:  Patient is a 83 y.o. female seen for evaluation for Electroconvulsive Therapy. Only mentioning her back pain  Treatment Summary:   [x]   Right Unilateral             []  Bilateral   % Energy : 0.3 ms 90%   Impedance: 1760 ohms  Seizure Energy Index: 6170 V squared  Postictal Suppression Index: 56%  Seizure Concordance Index: Also 56%  Medications  Pre Shock: Labetalol 20 mg Brevital 60 mg succinylcholine 80 mg  Post Shock:    Seizure Duration: 12 seconds EMG probably around 32 seconds EEG was a very gradual fade out.   Comments: Follow-up in 3 weeks  Lungs:  [x]   Clear to auscultation               []  Other:   Heart:    [x]   Regular rhythm             []  irregular rhythm    [x]   Previous H&P reviewed, patient examined and there are NO CHANGES                 []   Previous H&P reviewed, patient examined and there are changes noted.   Alethia Berthold, MD 11/11/202010:42 AM

## 2019-02-05 NOTE — Discharge Instructions (Signed)
1)  The drugs that you have been given will stay in your system until tomorrow so for the       next 24 hours you should not: ° A. Drive an automobile ° B. Make any legal decisions ° C. Drink any alcoholic beverages ° °2)  You may resume your regular meals upon return home. ° °3)  A responsible adult must take you home.  Someone should stay with you for a few          hours, then be available by phone for the remainder of the treatment day. ° °4)  You May experience any of the following symptoms: ° Headache, Nausea and a dry mouth (due to the medications you were given),  temporary memory loss and some confusion, or sore muscles (a warm bath  should help this).  If you you experience any of these symptoms let us know on                your return visit. ° °5)  Report any of the following: any acute discomfort, severe headache, or temperature        greater than 100.5 F.   Also report any unusual redness, swelling, drainage, or pain         at your IV site. ° °  You may report Symptoms to:  ECT PROGRAM- [place] at [facility] °         Phone: [phone], ECT Department  °         or Dr. Clapac's office [phone] ° °6)  Your next ECT Treatment is Wednesday December 2 ° We will call 2 days prior to your scheduled appointment for arrival times. ° °7)  Nothing to eat or drink after midnight the night before your procedure. ° °8)  Take      With a sip of water the morning of your procedure. ° °9)  Other Instructions: Call [phone] to cancel the morning of your procedure due         to illness or emergency. ° °10) We will call within 72 hours to assess how you are feeling.  °

## 2019-02-06 MED ORDER — LIDOCAINE HCL (PF) 2 % IJ SOLN
INTRAMUSCULAR | Status: AC
  Filled 2019-02-06: qty 10

## 2019-02-06 MED ORDER — GLYCOPYRROLATE 0.2 MG/ML IJ SOLN
INTRAMUSCULAR | Status: AC
  Filled 2019-02-06: qty 1

## 2019-02-06 MED ORDER — PROPOFOL 500 MG/50ML IV EMUL
INTRAVENOUS | Status: AC
  Filled 2019-02-06: qty 200

## 2019-02-14 ENCOUNTER — Other Ambulatory Visit: Payer: Self-pay

## 2019-02-14 ENCOUNTER — Telehealth: Payer: Self-pay

## 2019-02-14 MED ORDER — FUROSEMIDE 20 MG PO TABS: 20.0000 mg | ORAL_TABLET | Freq: Every day | ORAL | 0 refills | Status: DC

## 2019-02-23 ENCOUNTER — Other Ambulatory Visit: Payer: Self-pay

## 2019-02-23 ENCOUNTER — Other Ambulatory Visit: Payer: Self-pay | Admitting: Cardiovascular Disease

## 2019-02-24 MED ORDER — METOPROLOL TARTRATE 50 MG PO TABS: 50.0000 mg | ORAL_TABLET | Freq: Two times a day (BID) | ORAL | 0 refills | Status: DC

## 2019-02-24 NOTE — Telephone Encounter (Signed)
One month supply sent with message saying needs appointment for further refills.

## 2019-02-26 ENCOUNTER — Telehealth: Payer: Self-pay

## 2019-02-26 ENCOUNTER — Other Ambulatory Visit: Payer: Self-pay | Admitting: Psychiatry

## 2019-03-03 ENCOUNTER — Other Ambulatory Visit
Admission: RE | Admit: 2019-03-03 | Discharge: 2019-03-03 | Disposition: A | Discharge status: Home / Self Care | Payer: Medicare Other | Source: Ambulatory Visit | Attending: Psychiatry | Admitting: Psychiatry

## 2019-03-03 ENCOUNTER — Other Ambulatory Visit: Payer: Self-pay

## 2019-03-03 DIAGNOSIS — Z01812 Encounter for preprocedural laboratory examination: Secondary | ICD-10-CM | POA: Diagnosis present

## 2019-03-03 DIAGNOSIS — Z20828 Contact with and (suspected) exposure to other viral communicable diseases: Secondary | ICD-10-CM | POA: Insufficient documentation

## 2019-03-03 LAB — SARS CORONAVIRUS 2 (TAT 6-24 HRS): SARS Coronavirus 2: NEGATIVE

## 2019-03-04 ENCOUNTER — Other Ambulatory Visit: Payer: Self-pay | Admitting: Psychiatry

## 2019-03-05 ENCOUNTER — Encounter: Payer: Self-pay | Admitting: Anesthesiology

## 2019-03-05 ENCOUNTER — Encounter
Admission: RE | Admit: 2019-03-05 | Discharge: 2019-03-05 | Disposition: A | Discharge status: Home / Self Care | Payer: Medicare Other | Source: Ambulatory Visit | Attending: Psychiatry | Admitting: Psychiatry

## 2019-03-05 ENCOUNTER — Other Ambulatory Visit: Payer: Self-pay

## 2019-03-05 DIAGNOSIS — Z882 Allergy status to sulfonamides status: Secondary | ICD-10-CM | POA: Diagnosis not present

## 2019-03-05 DIAGNOSIS — K219 Gastro-esophageal reflux disease without esophagitis: Secondary | ICD-10-CM | POA: Diagnosis not present

## 2019-03-05 DIAGNOSIS — I5032 Chronic diastolic (congestive) heart failure: Secondary | ICD-10-CM | POA: Insufficient documentation

## 2019-03-05 DIAGNOSIS — F0390 Unspecified dementia without behavioral disturbance: Secondary | ICD-10-CM | POA: Diagnosis not present

## 2019-03-05 DIAGNOSIS — F319 Bipolar disorder, unspecified: Secondary | ICD-10-CM | POA: Insufficient documentation

## 2019-03-05 DIAGNOSIS — F332 Major depressive disorder, 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 features: Secondary | ICD-10-CM | POA: Diagnosis not present

## 2019-03-05 DIAGNOSIS — Z87891 Personal history of nicotine dependence: Secondary | ICD-10-CM | POA: Insufficient documentation

## 2019-03-05 DIAGNOSIS — I110 Hypertensive heart disease with heart failure: Secondary | ICD-10-CM | POA: Diagnosis not present

## 2019-03-05 IMAGING — CT CT MAXILLOFACIAL W/O CM
4 of 11 series · 14 of 47 positions shown, 16 images · non-contrast
Comparison: 02/10/2017

CLINICAL DATA: Pt arrived via [facility] from home with mechanical fall.
No LOC or blood thinners. Pt has laceration to nose and upper lip.

EXAM:
CT HEAD WITHOUT CONTRAST
CT MAXILLOFACIAL WITHOUT CONTRAST
CT CERVICAL SPINE WITHOUT CONTRAST
TECHNIQUE: Multidetector CT imaging of the head, cervical spine, and
maxillofacial structures were performed using the standard protocol
without intravenous contrast. Multiplanar CT image reconstructions
of the cervical spine and maxillofacial structures were also
generated.

[Series 6: ax bone wo · axial · 0.32mm/px · z∈[-160,-64]mm · 4 of 80 slices shown]
[im 16/80  bone]
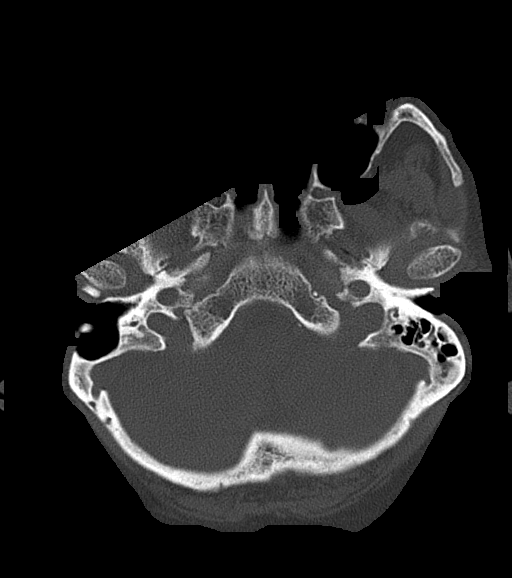
[im 32/80  bone]
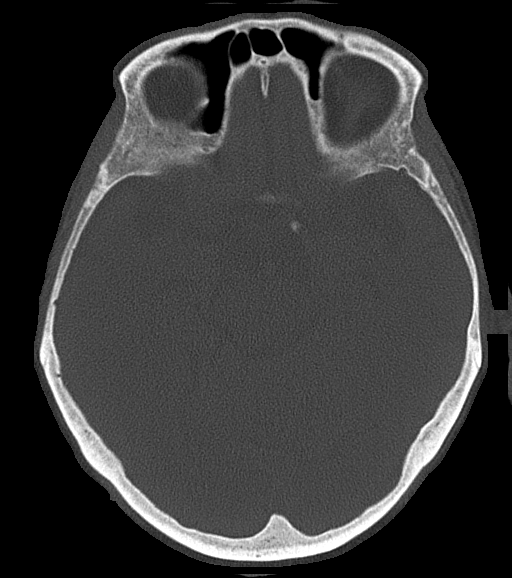
[im 48/80  bone]
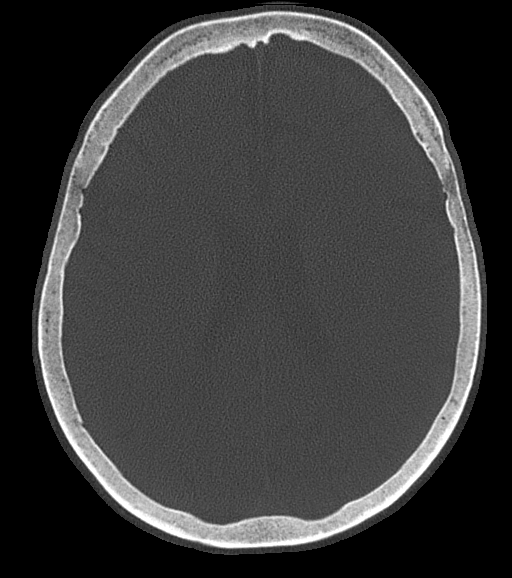
[im 64/80  bone]
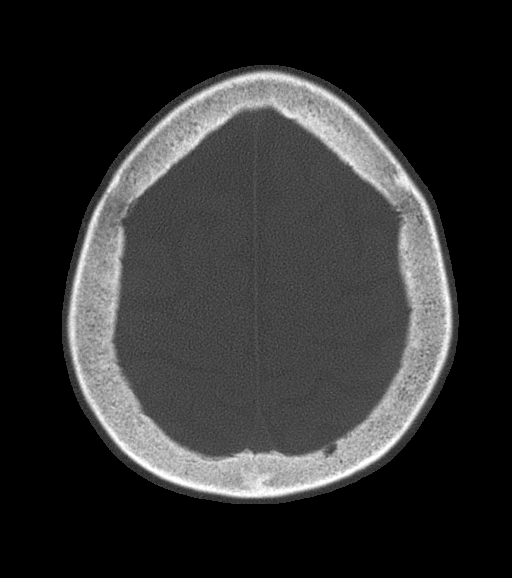

[Series 12: orthogonal axials · axial · 0.39mm/px · z∈[-314,-207]mm · 5 of 89 slices shown, 7 images]
[im 15/89  brain]
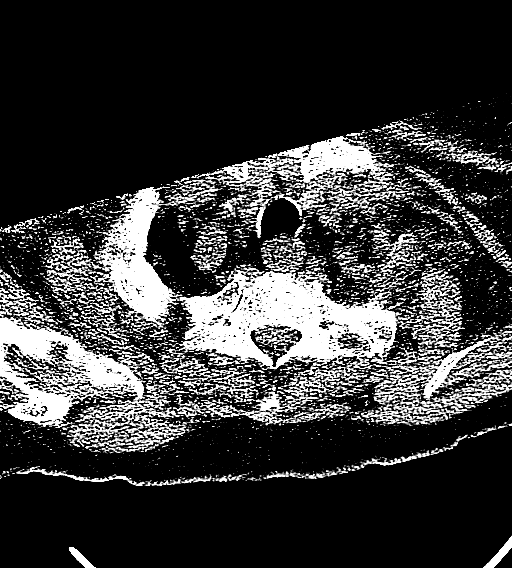
[im 15/89  bone]
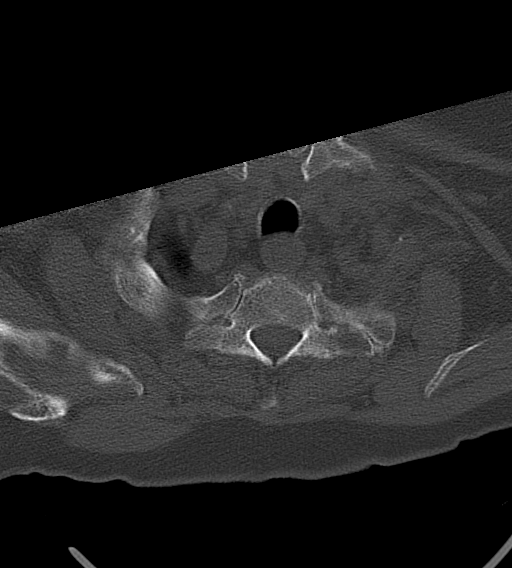
[im 30/89  bone]
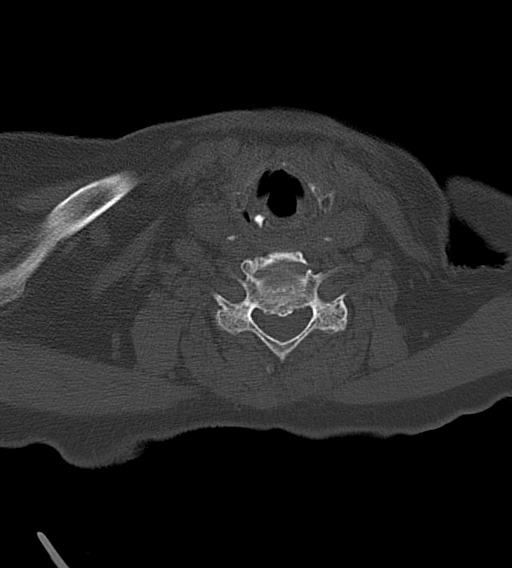
[im 45/89  bone]
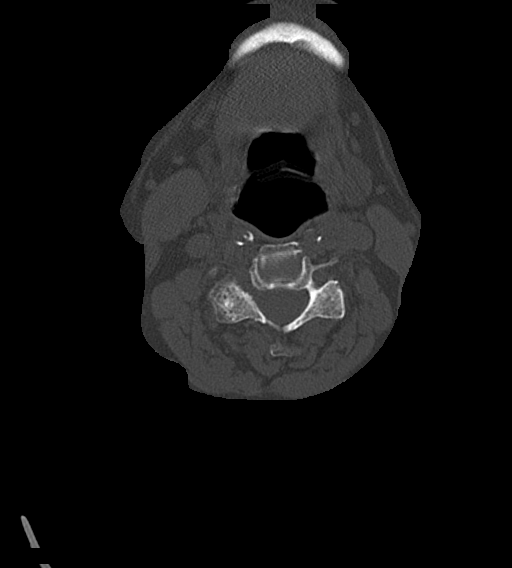
[im 59/89  bone]
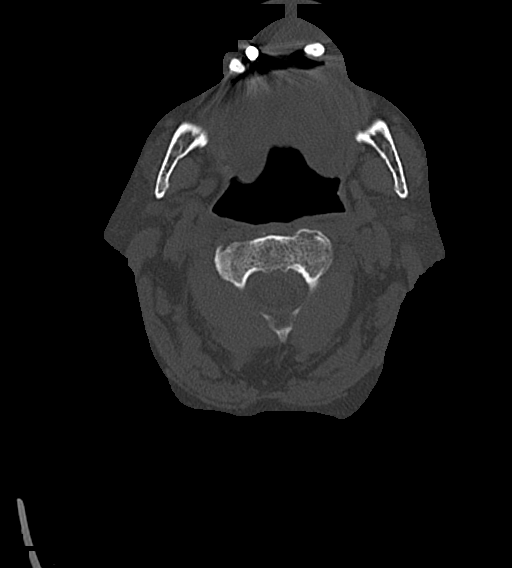
[im 74/89  brain]
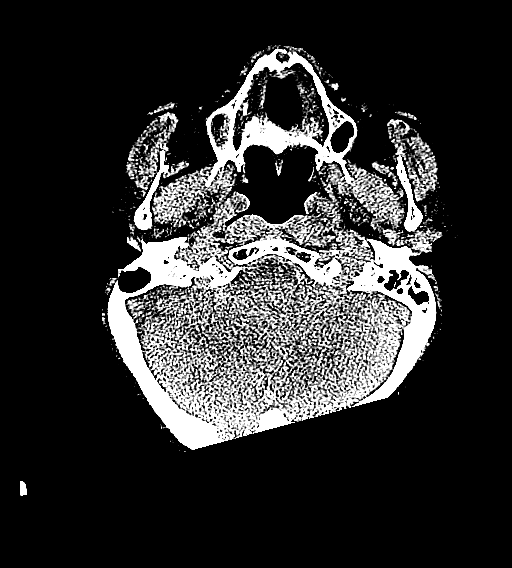
[im 74/89  bone]
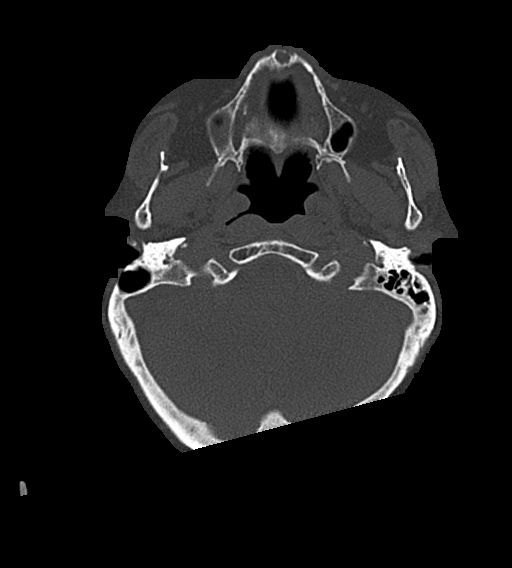

[Series 17: coronal soft · coronal · 0.38mm/px · 3 of 90 slices shown]
[im 23/90  bone]
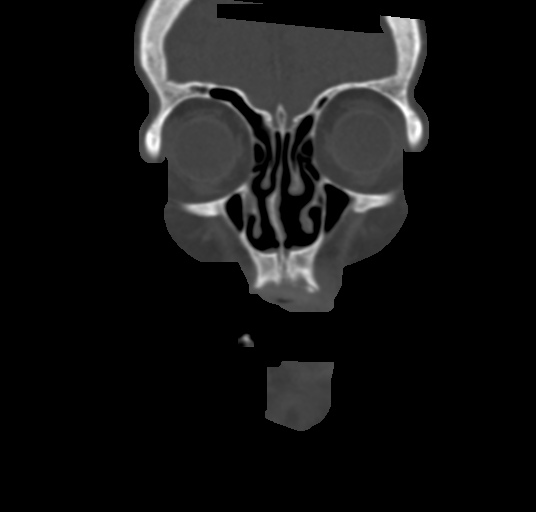
[im 45/90  bone]
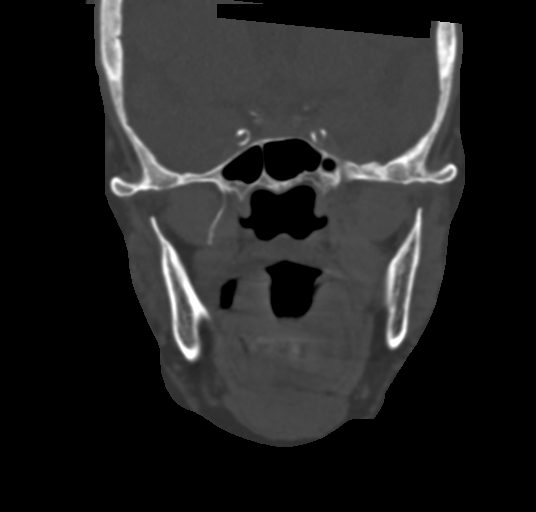
[im 67/90  bone]
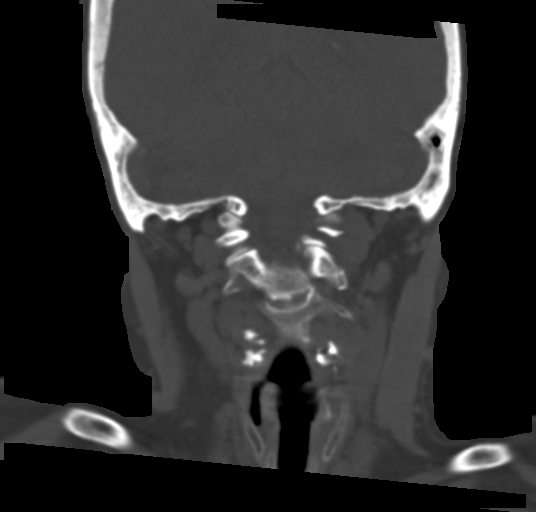

[Series 18: sagittal soft · sagittal · 0.39mm/px · 2 of 88 slices shown]
[im 37/88  bone]
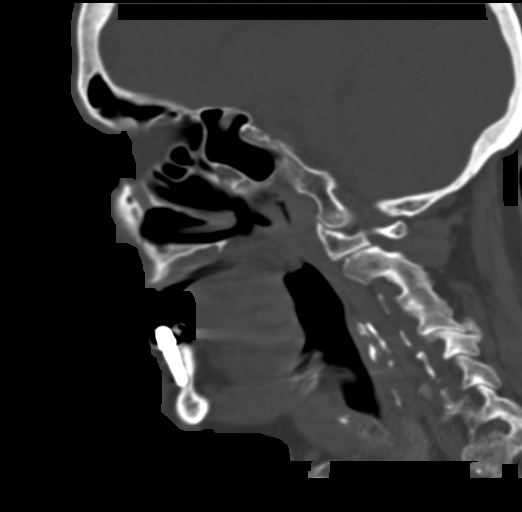
[im 74/88  bone]
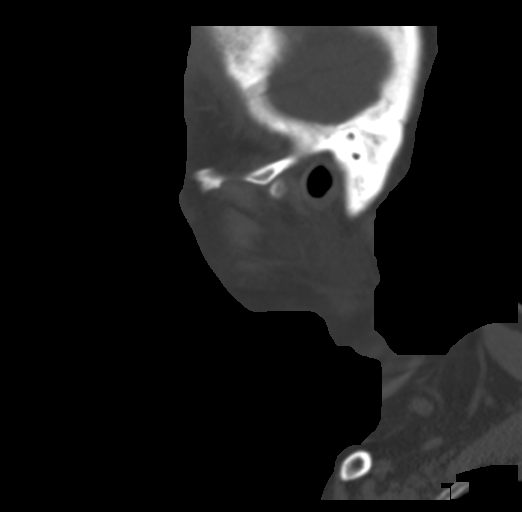

[14 of 47 positions shown; findings below may reference images not displayed]

FINDINGS: CT HEAD FINDINGS

Brain: No evidence of acute infarction, hemorrhage, hydrocephalus,
extra-axial collection or mass lesion/mass effect.

Patchy areas of white matter hypoattenuation noted consistent with
mild chronic microvascular ischemic change.

Vascular: No hyperdense vessel or unexpected calcification.

Skull: Normal. Negative for fracture or focal lesion.

Other: None.

CT MAXILLOFACIAL FINDINGS

Osseous: Subtle nondisplaced fracture along left anterior maxillary
alveolar ridge. Patient is edentulous.

No other fractures.  No bone lesions.

Orbits: Negative. No traumatic or inflammatory finding.

Sinuses: Sinuses are clear.  Status post right mastoidectomy.

Soft tissues: Soft tissue swelling of the upper lip. No other soft
tissue abnormality. No mass or adenopathy.

CT CERVICAL SPINE FINDINGS

Alignment: Straightening of the normal cervical lordosis. No
spondylolisthesis.

Skull base and vertebrae: No acute fracture. No primary bone lesion
or focal pathologic process.

Soft tissues and spinal canal: No prevertebral fluid or swelling. No
visible canal hematoma.

No soft tissue masses or adenopathy.

Disc levels: Mild loss of disc height C2-C3 through C5-C6 with
moderate loss of disc height at C6-C7. Facet degenerative changes
are noted greatest on the right at C4-C5. No convincing disc
herniation.

Upper chest: No acute findings.  Clear lung apices.

Other: None.
IMPRESSION: HEAD CT

1. No acute intracranial abnormalities.
2. No skull fracture.

MAXILLOFACIAL CT

1. Subtle nondisplaced fracture of the aveolar ridge, left anterior
maxilla. No other fractures. There is associated soft tissue
swelling.

CERVICAL CT

1. No fracture or acute finding.

## 2019-03-05 MED ORDER — FENTANYL CITRATE (PF) 100 MCG/2ML IJ SOLN: 25.0000 ug | INTRAMUSCULAR | Status: DC | PRN

## 2019-03-05 MED ORDER — LABETALOL HCL 5 MG/ML IV SOLN
INTRAVENOUS | Status: DC | PRN
  Administered 2019-03-05: 20 mg via INTRAVENOUS

## 2019-03-05 MED ORDER — ONDANSETRON HCL 4 MG/2ML IJ SOLN: 4.0000 mg | Freq: Once | INTRAMUSCULAR | Status: DC | PRN

## 2019-03-05 MED ORDER — SODIUM CHLORIDE 0.9 % IV SOLN: 500.0000 mL | Freq: Once | INTRAVENOUS | Status: DC

## 2019-03-05 MED ORDER — METHOHEXITAL SODIUM 100 MG/10ML IV SOSY
PREFILLED_SYRINGE | INTRAVENOUS | Status: DC | PRN
  Administered 2019-03-05: 60 mg via INTRAVENOUS

## 2019-03-05 MED ORDER — SODIUM CHLORIDE 0.9 % IV SOLN
500.0000 mL | Freq: Once | INTRAVENOUS | Status: AC
  Administered 2019-03-05: 10:00:00 via INTRAVENOUS

## 2019-03-05 MED ORDER — SUCCINYLCHOLINE CHLORIDE 200 MG/10ML IV SOSY
PREFILLED_SYRINGE | INTRAVENOUS | Status: DC | PRN
  Administered 2019-03-05: 80 mg via INTRAVENOUS

## 2019-03-05 NOTE — Discharge Instructions (Addendum)
1)  The drugs that you have been given will stay in your system until tomorrow so for the       next 24 hours you should not:  A. Drive an automobile  B. Make any legal decisions  C. Drink any alcoholic beverages  2)  You may resume your regular meals upon return home.  3)  A responsible adult must take you home.  Someone should stay with you for a few          hours, then be available by phone for the remainder of the treatment day.  4)  You May experience any of the following symptoms:  Headache, Nausea and a dry mouth (due to the medications you were given),  temporary memory loss and some confusion, or sore muscles (a warm bath  should help this).  If you you experience any of these symptoms let us know on                your return visit.  5)  Report any of the following: any acute discomfort, severe headache, or temperature        greater than 100.5 F.   Also report any unusual redness, swelling, drainage, or pain         at your IV site.    You may report Symptoms to:  [place] at [facility]          Phone: [phone], ECT Department           or Dr. Prescott Gum office [phone]  6)  Your next ECT Treatment is Wednesday April 02, 2019. PLEASE RECEIVE A COVID TEST Monday March 31, 2019 ANYTIME BEFORE 3 PM  We will call 2 days prior to your scheduled appointment for arrival times.  7)  Nothing to eat or drink after midnight the night before your procedure.  8)  Take      With a sip of water the morning of your procedure.  9)  Other Instructions: Call [phone] to cancel the morning of your procedure due         to illness or emergency.  10) We will call within 72 hours to assess how you are feeling.

## 2019-03-05 NOTE — Anesthesia Procedure Notes (Signed)
Performed by: Cristol Engdahl, CRNA Pre-anesthesia Checklist: Patient identified, Emergency Drugs available, Suction available and Patient being monitored Patient Re-evaluated:Patient Re-evaluated prior to induction Oxygen Delivery Method: Simple face mask Induction Type: IV induction Dental Injury: Teeth and Oropharynx as per pre-operative assessment        

## 2019-03-05 NOTE — Anesthesia Post-op Follow-up Note (Signed)
Anesthesia QCDR form completed.        

## 2019-03-05 NOTE — Anesthesia Preprocedure Evaluation (Signed)
Anesthesia Evaluation  Patient identified by MRN, date of birth, ID band Patient awake    Reviewed: Allergy & Precautions, H&P , NPO status , Patient's Chart, lab work & pertinent test results  Airway Mallampati: II   Neck ROM: full    Dental  (+) Missing, Edentulous Upper   Pulmonary shortness of breath and with exertion, former smoker,           Cardiovascular hypertension, + CAD and +CHF (HFpEF)  (-) dysrhythmias      Neuro/Psych PSYCHIATRIC DISORDERS Depression Bipolar Disorder negative neurological ROS     GI/Hepatic Neg liver ROS, GERD  Controlled,  Endo/Other  negative endocrine ROS  Renal/GU negative Renal ROS  negative genitourinary   Musculoskeletal   Abdominal   Peds  Hematology negative hematology ROS (+)   Anesthesia Other Findings     Reproductive/Obstetrics negative OB ROS                             Anesthesia Physical  Anesthesia Plan  ASA: III  Anesthesia Plan: General   Post-op Pain Management:    Induction:   PONV Risk Score and Plan:   Airway Management Planned: Mask  Additional Equipment:   Intra-op Plan:   Post-operative Plan:   Informed Consent: I have reviewed the patients History and Physical, chart, labs and discussed the procedure including the risks, benefits and alternatives for the proposed anesthesia with the patient or authorized representative who has indicated his/her understanding and acceptance.       Plan Discussed with:   Anesthesia Plan Comments:         Anesthesia Quick Evaluation

## 2019-03-05 NOTE — H&P (Signed)
Jenna Dudley is an 83 y.o. female.   Chief Complaint: no new complaint. Chronic back pain. Mood fine. No psychotic complaints HPI: recurrent psychotic depression  Past Medical History:  Diagnosis Date  . Bilateral carpal tunnel syndrome 07/27/14  . Bipolar disorder [facility])    geropsych admission to [facility] in Dec 2018  . Chronic diastolic CHF (congestive heart failure) ([place])    a. 03/2017 Echo: EF 55-60%, no rwma, Gr1 DD, mild MR.  . Chronic low back pain 07/29/14  . Degenerative arthritis of lumbar spine 07/27/14  . Dementia ([place])   . Depression   . Esophageal spasm 07/29/14  . GERD (gastroesophageal reflux disease)   . History of stress test    a. 04/2017 MV: low risk stress test.  EF 55-65%. Probable apical ant/apical, basal, and mid inflat attenuation artifact vs ischemia/scar.  . Hyperlipemia 07/29/14  . Hypertension   . Pneumonia   . Spinal stenosis 07/29/14    Past Surgical History:  Procedure Laterality Date  . HIP SURGERY Right   . INNER EAR SURGERY Right   . REPLACEMENT TOTAL KNEE BILATERAL Bilateral 07/27/14    Family History  Problem Relation Age of Onset  . Hypertension Mother   . Stroke Mother    Social History:  reports that she quit smoking about 43 years ago. Her smoking use included cigarettes. She has never used smokeless tobacco. She reports that she does not drink alcohol or use drugs.  Allergies:  Allergies  Allergen Reactions  . Aspirin Other (See Comments)    Unknown reaction  . Sulfa Antibiotics Other (See Comments)    Unknown reaction    (Not in a hospital admission)   Results for orders placed or performed during the hospital encounter of 03/03/19 (from the past 48 hour(s))  SARS CORONAVIRUS 2 (TAT 6-24 HRS) Nasopharyngeal Nasopharyngeal Swab     Status: None   Collection Time: 03/03/19 11:31 AM   Specimen: Nasopharyngeal Swab  Result Value Ref Range   SARS Coronavirus 2 NEGATIVE NEGATIVE    Comment: (NOTE) SARS-CoV-2 target nucleic acids are  NOT DETECTED. The SARS-CoV-2 RNA is generally detectable in upper and lower respiratory specimens during the acute phase of infection. Negative results do not preclude SARS-CoV-2 infection, do not rule out co-infections with other pathogens, and should not be used as the sole basis for treatment or other patient management decisions. Negative results must be combined with clinical observations, patient history, and epidemiological information. The expected result is Negative. Fact Sheet for Patients: SugarRoll.be Fact Sheet for Healthcare Providers: https://www.woods-mathews.com/ This test is not yet approved or cleared by the [place] FDA and  has been authorized for detection and/or diagnosis of SARS-CoV-2 by FDA under an Emergency Use Authorization (EUA). This EUA will remain  in effect (meaning this test can be used) for the duration of the COVID-19 declaration under Section 56 4(b)(1) of the Act, 21 U.S.C. section 360bbb-3(b)(1), unless the authorization is terminated or revoked sooner. Performed at [facility], [place] [address] [place], [place] [address]    No results found.  Review of Systems  Constitutional: Negative.   HENT: Negative.   Eyes: Negative.   Respiratory: Negative.   Cardiovascular: Negative.   Gastrointestinal: Negative.   Musculoskeletal: Negative.   Skin: Negative.   Neurological: Negative.   Psychiatric/Behavioral: Negative.     Blood pressure (!) 132/53, pulse 82, temperature 98 F (36.7 C), temperature source Oral, resp. rate 18, SpO2 97 %. Physical Exam  Nursing note  and vitals reviewed. Constitutional: She appears well-developed and well-nourished.  HENT:  Head: Normocephalic and atraumatic.  Eyes: Pupils are equal, round, and reactive to light. Conjunctivae are normal.  Neck: Normal range of motion.  Cardiovascular: Normal heart sounds.  Respiratory: Effort normal.  GI: Soft.   Musculoskeletal: Normal range of motion.  Neurological: She is alert.  Skin: Skin is warm and dry.  Psychiatric: Judgment normal. Her affect is blunt. Her speech is delayed. She is slowed. Thought content is not paranoid. She expresses no homicidal and no suicidal ideation. She exhibits abnormal recent memory.     Assessment/Plan ect today and follow up post holiday  Mordecai Rasmussen, MD 03/05/2019, 9:56 AM

## 2019-03-05 NOTE — Procedures (Signed)
ECT SERVICES Physician's Interval Evaluation & Treatment Note  Patient Identification: Jenna Dudley MRN:  [id number] Date of Evaluation:  03/05/2019 TX #: 33  MADRS:   MMSE:   P.E. Findings:  No change to physical exam.  She told me that she had had a fall recently at home but I checked to the back of her head where she said she had hit her self and there was no sign of any trauma.  The rest of the physical exam was all unremarkable  Psychiatric Interval Note:  Patient overall seems to be at her baseline although there was some concern that I had as we were removing her to ECT she started to cry and ask me if she had done anything wrong.  This sort of thinking is usually a tip off that she may be getting depressed.  Subjective:  Patient is a 83 y.o. female seen for evaluation for Electroconvulsive Therapy. Patient herself did not have any complaints  Treatment Summary:   [x]   Right Unilateral             []  Bilateral   % Energy : 0.3 ms 90%   Impedance: 1980 ohms  Seizure Energy Index: 4802 V squared  Postictal Suppression Index: 40%  Seizure Concordance Index: 67%  Medications  Pre Shock: Labetalol 20 mg Brevital 60 mg succinylcholine 80 mg  Post Shock:    Seizure Duration: 26 seconds EMG 35 seconds by EEG.   Comments: I think the readings the computer came up with for suppression index are probably incorrect and that the number is probably better than that.  I think it may have missed read the point of ending the seizure.  Patient will return in about 3 weeks which is in between Christmas and New Year's.  Lungs:  [x]   Clear to auscultation               []  Other:   Heart:    [x]   Regular rhythm             []  irregular rhythm    [x]   Previous H&P reviewed, patient examined and there are NO CHANGES                 []   Previous H&P reviewed, patient examined and there are changes noted.   Alethia Berthold, MD 12/9/20204:25 PM

## 2019-03-05 NOTE — Anesthesia Postprocedure Evaluation (Signed)
Anesthesia Post Note  Patient: Jenna Dudley  Procedure(s) Performed: ECT TX  Patient location during evaluation: PACU Anesthesia Type: General Level of consciousness: awake and alert Pain management: pain level controlled Vital Signs Assessment: post-procedure vital signs reviewed and stable Respiratory status: spontaneous breathing and respiratory function stable Cardiovascular status: stable Anesthetic complications: no     Last Vitals:  Vitals:   03/05/19 1043 03/05/19 1053  BP: (!) 147/60 (!) 127/57  Pulse: 86 88  Resp: (!) 25 (!) 31  Temp: 36.4 C   SpO2: 95% 100%    Last Pain:  Vitals:   03/05/19 1053  TempSrc:   PainSc: 0-No pain                 Rice Walsh K

## 2019-03-05 NOTE — Transfer of Care (Signed)
Immediate Anesthesia Transfer of Care Note  Patient: Jenna Dudley  Procedure(s) Performed: * No procedures listed *  Patient Location: PACU  Anesthesia Type:General  Level of Consciousness: sedated  Airway & Oxygen Therapy: Patient Spontanous Breathing and Patient connected to face mask oxygen  Post-op Assessment: Report given to RN and Post -op Vital signs reviewed and stable  Post vital signs: Reviewed and stable  Last Vitals:  Vitals:   03/05/19 0858 03/05/19 1043  BP: (!) 132/53 (!) 147/60  Pulse: 82 86  Resp: 18 (!) 25  Temp: 36.7 C 36.4 C  SpO2: 97% 74%    Complications: No apparent anesthesia complications

## 2019-03-12 NOTE — Pre-Procedure Instructions (Signed)
Patient was sent a letter reminding her that she has an appointment Wednesday January 6,2021  at 9:00am. The letter also stated that she needs to have a COVID test done Monday March 31, 2019 anytime before 3 pm at the Pre-Admission testing area.This is located at the [place] [facility]. My office number was also left on the letter.

## 2019-03-19 ENCOUNTER — Other Ambulatory Visit: Payer: Self-pay

## 2019-03-19 ENCOUNTER — Other Ambulatory Visit: Payer: Self-pay | Admitting: Psychiatry

## 2019-03-19 MED ORDER — FUROSEMIDE 20 MG PO TABS: 20.0000 mg | ORAL_TABLET | Freq: Every day | ORAL | 0 refills | Status: DC

## 2019-03-26 ENCOUNTER — Other Ambulatory Visit: Payer: Self-pay

## 2019-03-30 ENCOUNTER — Telehealth: Payer: Self-pay

## 2019-03-31 ENCOUNTER — Telehealth: Payer: Self-pay

## 2019-04-04 ENCOUNTER — Other Ambulatory Visit
Admission: RE | Admit: 2019-04-04 | Discharge: 2019-04-04 | Disposition: A | Discharge status: Home / Self Care | Payer: Medicare Other | Source: Ambulatory Visit | Attending: Psychiatry | Admitting: Psychiatry

## 2019-04-04 ENCOUNTER — Other Ambulatory Visit: Payer: Self-pay

## 2019-04-04 DIAGNOSIS — Z20822 Contact with and (suspected) exposure to covid-19: Secondary | ICD-10-CM | POA: Insufficient documentation

## 2019-04-04 DIAGNOSIS — Z01812 Encounter for preprocedural laboratory examination: Secondary | ICD-10-CM | POA: Diagnosis present

## 2019-04-05 LAB — SARS CORONAVIRUS 2 (TAT 6-24 HRS): SARS Coronavirus 2: NEGATIVE

## 2019-04-10 ENCOUNTER — Other Ambulatory Visit: Payer: Self-pay | Admitting: Psychiatry

## 2019-04-10 ENCOUNTER — Ambulatory Visit: Payer: Medicare Other | Admitting: Nurse Practitioner

## 2019-04-11 ENCOUNTER — Encounter: Payer: Self-pay | Admitting: Anesthesiology

## 2019-04-11 ENCOUNTER — Encounter
Admission: RE | Admit: 2019-04-11 | Discharge: 2019-04-11 | Disposition: A | Discharge status: Home / Self Care | Payer: Medicare Other | Source: Ambulatory Visit | Attending: Psychiatry | Admitting: Psychiatry

## 2019-04-11 ENCOUNTER — Other Ambulatory Visit: Payer: Self-pay

## 2019-04-11 DIAGNOSIS — F323 Major depressive disorder, single episode, severe with psychotic features: Secondary | ICD-10-CM | POA: Diagnosis present

## 2019-04-11 DIAGNOSIS — Z7901 Long term (current) use of anticoagulants: Secondary | ICD-10-CM | POA: Diagnosis not present

## 2019-04-11 DIAGNOSIS — I110 Hypertensive heart disease with heart failure: Secondary | ICD-10-CM | POA: Diagnosis not present

## 2019-04-11 DIAGNOSIS — I5032 Chronic diastolic (congestive) heart failure: Secondary | ICD-10-CM | POA: Insufficient documentation

## 2019-04-11 DIAGNOSIS — Z79899 Other long term (current) drug therapy: Secondary | ICD-10-CM | POA: Diagnosis not present

## 2019-04-11 DIAGNOSIS — E785 Hyperlipidemia, unspecified: Secondary | ICD-10-CM | POA: Diagnosis not present

## 2019-04-11 DIAGNOSIS — K219 Gastro-esophageal reflux disease without esophagitis: Secondary | ICD-10-CM | POA: Diagnosis not present

## 2019-04-11 DIAGNOSIS — F0390 Unspecified dementia without behavioral disturbance: Secondary | ICD-10-CM | POA: Insufficient documentation

## 2019-04-11 DIAGNOSIS — Z87891 Personal history of nicotine dependence: Secondary | ICD-10-CM | POA: Diagnosis not present

## 2019-04-11 DIAGNOSIS — F332 Major depressive disorder, 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 features: Secondary | ICD-10-CM

## 2019-04-11 MED ORDER — LABETALOL HCL 5 MG/ML IV SOLN
INTRAVENOUS | Status: DC | PRN
  Administered 2019-04-11 (×2): 10 mg via INTRAVENOUS

## 2019-04-11 MED ORDER — LABETALOL HCL 5 MG/ML IV SOLN
INTRAVENOUS | Status: AC
  Filled 2019-04-11: qty 4

## 2019-04-11 MED ORDER — SODIUM CHLORIDE 0.9 % IV SOLN: INTRAVENOUS | Status: DC | PRN

## 2019-04-11 MED ORDER — ONDANSETRON HCL 4 MG/2ML IJ SOLN: 4.0000 mg | Freq: Once | INTRAMUSCULAR | Status: DC | PRN

## 2019-04-11 MED ORDER — KETOROLAC TROMETHAMINE 30 MG/ML IJ SOLN
15.0000 mg | Freq: Once | INTRAMUSCULAR | Status: AC
  Administered 2019-04-11: 15 mg via INTRAVENOUS

## 2019-04-11 MED ORDER — FENTANYL CITRATE (PF) 100 MCG/2ML IJ SOLN: 25.0000 ug | INTRAMUSCULAR | Status: DC | PRN

## 2019-04-11 MED ORDER — LIDOCAINE HCL (PF) 2 % IJ SOLN
INTRAMUSCULAR | Status: AC
  Filled 2019-04-11: qty 10

## 2019-04-11 MED ORDER — SUCCINYLCHOLINE CHLORIDE 20 MG/ML IJ SOLN
INTRAMUSCULAR | Status: DC | PRN
  Administered 2019-04-11: 80 mg via INTRAVENOUS

## 2019-04-11 MED ORDER — ESMOLOL HCL 100 MG/10ML IV SOLN
INTRAVENOUS | Status: AC
  Filled 2019-04-11: qty 10

## 2019-04-11 MED ORDER — KETOROLAC TROMETHAMINE 30 MG/ML IJ SOLN
INTRAMUSCULAR | Status: AC
  Filled 2019-04-11: qty 1

## 2019-04-11 MED ORDER — METHOHEXITAL SODIUM 100 MG/10ML IV SOSY
PREFILLED_SYRINGE | INTRAVENOUS | Status: DC | PRN
  Administered 2019-04-11: 60 mg via INTRAVENOUS

## 2019-04-11 MED ORDER — PHENYLEPHRINE HCL (PRESSORS) 10 MG/ML IV SOLN
INTRAVENOUS | Status: AC
  Filled 2019-04-11: qty 1

## 2019-04-11 MED ORDER — SUCCINYLCHOLINE CHLORIDE 20 MG/ML IJ SOLN
INTRAMUSCULAR | Status: AC
  Filled 2019-04-11: qty 1

## 2019-04-11 MED ORDER — SODIUM CHLORIDE 0.9 % IV SOLN
500.0000 mL | Freq: Once | INTRAVENOUS | Status: AC
  Administered 2019-04-11: 500 mL via INTRAVENOUS

## 2019-04-11 NOTE — Transfer of Care (Signed)
Immediate Anesthesia Transfer of Care Note  Patient: Jenna Dudley  Procedure(s) Performed: ECT TX  Patient Location: PACU  Anesthesia Type:General  Level of Consciousness: sedated  Airway & Oxygen Therapy: Patient Spontanous Breathing and Patient connected to face mask oxygen  Post-op Assessment: Report given to RN and Post -op Vital signs reviewed and stable  Post vital signs: Reviewed and stable  Last Vitals:  Vitals Value Taken Time  BP    Temp    Pulse    Resp    SpO2      Last Pain:  Vitals:   04/11/19 0948  TempSrc:   PainSc: 0-No pain         Complications: No apparent anesthesia complications

## 2019-04-11 NOTE — Procedures (Signed)
ECT SERVICES Physician's Interval Evaluation & Treatment Note  Patient Identification: Jenna Dudley MRN:  [id number] Date of Evaluation:  04/11/2019 TX #: 34  MADRS:   MMSE:   P.E. Findings:  No change to physical exam  Psychiatric Interval Note:  Patient continues to ask whether she has done anything wrong.  This is a typical sign of depression but it is been present now several times without getting worse  Subjective:  Patient is a 84 y.o. female seen for evaluation for Electroconvulsive Therapy. Not complaining of depression  Treatment Summary:   [x]   Right Unilateral             []  Bilateral   % Energy : 0.3 ms 90%   Impedance: 2100 ohms  Seizure Energy Index: 12,187 V squared  Postictal Suppression Index: 56%  Seizure Concordance Index: 64%  Medications  Pre Shock: Labetalol 20 mg Brevital 60 mg succinylcholine 80 mg  Post Shock:    Seizure Duration: EMG 27 seconds EEG 58 seconds   Comments: Clearly her dementia has gotten worse we will see her back in about 4 weeks.  We will try to spread things out as much we can without returning to psychotic depression  Lungs:  [x]   Clear to auscultation               []  Other:   Heart:    [x]   Regular rhythm             []  irregular rhythm    [x]   Previous H&P reviewed, patient examined and there are NO CHANGES                 []   Previous H&P reviewed, patient examined and there are changes noted.   , MD 1/15/202111:22 AM

## 2019-04-11 NOTE — Anesthesia Preprocedure Evaluation (Signed)
Anesthesia Evaluation  Patient identified by MRN, date of birth, ID band Patient awake    Reviewed: Allergy & Precautions, H&P , NPO status , Patient's Chart, lab work & pertinent test results  Airway Mallampati: II   Neck ROM: full    Dental  (+) Missing, Edentulous Upper   Pulmonary shortness of breath and with exertion, former smoker,           Cardiovascular hypertension, + CAD and +CHF (HFpEF)  (-) dysrhythmias      Neuro/Psych PSYCHIATRIC DISORDERS Depression Bipolar Disorder negative neurological ROS     GI/Hepatic Neg liver ROS, GERD  Controlled,  Endo/Other  negative endocrine ROS  Renal/GU negative Renal ROS  negative genitourinary   Musculoskeletal   Abdominal   Peds  Hematology negative hematology ROS (+)   Anesthesia Other Findings     Reproductive/Obstetrics negative OB ROS                             Anesthesia Physical  Anesthesia Plan  ASA: III  Anesthesia Plan: General   Post-op Pain Management:    Induction:   PONV Risk Score and Plan:   Airway Management Planned: Mask  Additional Equipment:   Intra-op Plan:   Post-operative Plan:   Informed Consent: I have reviewed the patients History and Physical, chart, labs and discussed the procedure including the risks, benefits and alternatives for the proposed anesthesia with the patient or authorized representative who has indicated his/her understanding and acceptance.       Plan Discussed with: CRNA and Surgeon  Anesthesia Plan Comments:         Anesthesia Quick Evaluation

## 2019-04-11 NOTE — Anesthesia Procedure Notes (Signed)
Date/Time: 04/11/2019 11:25 AM Performed by: Rosanne Gutting, CRNA Pre-anesthesia Checklist: Patient identified, Emergency Drugs available, Suction available and Patient being monitored Patient Re-evaluated:Patient Re-evaluated prior to induction Oxygen Delivery Method: Circle system utilized Preoxygenation: Pre-oxygenation with 100% oxygen Induction Type: IV induction Ventilation: Mask ventilation without difficulty and Mask ventilation throughout procedure Airway Equipment and Method: Bite block Placement Confirmation: positive ETCO2 Dental Injury: Teeth and Oropharynx as per pre-operative assessment

## 2019-04-11 NOTE — Anesthesia Postprocedure Evaluation (Signed)
Anesthesia Post Note  Patient: Jenna Dudley  Procedure(s) Performed: ECT TX  Patient location during evaluation: PACU Anesthesia Type: General Level of consciousness: awake and alert and oriented Pain management: pain level controlled Vital Signs Assessment: post-procedure vital signs reviewed and stable Respiratory status: spontaneous breathing Cardiovascular status: blood pressure returned to baseline Postop Assessment: no headache Anesthetic complications: no     Last Vitals:  Vitals:   04/11/19 1215 04/11/19 1230  BP: 115/70 120/63  Pulse: 86 83  Resp: 16 16  Temp:    SpO2: 98% 93%    Last Pain:  Vitals:   04/11/19 1230  TempSrc:   PainSc: 0-No pain                 Gwendolynn Merkey

## 2019-04-11 NOTE — Discharge Instructions (Signed)
1)  The drugs that you have been given will stay in your system until tomorrow so for the       next 24 hours you should not:  A. Drive an automobile  B. Make any legal decisions  C. Drink any alcoholic beverages  2)  You may resume your regular meals upon return home.  3)  A responsible adult must take you home.  Someone should stay with you for a few          hours, then be available by phone for the remainder of the treatment day.  4)  You May experience any of the following symptoms:  Headache, Nausea and a dry mouth (due to the medications you were given),  temporary memory loss and some confusion, or sore muscles (a warm bath  should help this).  If you you experience any of these symptoms let us know on                your return visit.  5)  Report any of the following: any acute discomfort, severe headache, or temperature        greater than 100.5 F.   Also report any unusual redness, swelling, drainage, or pain         at your IV site.    You may report Symptoms to:  ECT PROGRAM- [place] [facility]          Phone: [phone], ECT Department           or Dr. Shary Key office [phone]  6)  Your next ECT Treatment is Wednesday February 24  We will call 2 days prior to your scheduled appointment for arrival times.  7)  Nothing to eat or drink after midnight the night before your procedure.  8)  Take morning meds with a sip of water the morning of your procedure.  9)  Other Instructions: Call [phone] to cancel the morning of your procedure due         to illness or emergency.  10) We will call within 72 hours to assess how you are feeling.

## 2019-04-11 NOTE — H&P (Signed)
Jenna Dudley is an 84 y.o. female.   Chief Complaint: Patient has no specific complaints HPI: History of recurrent severe psychotic depression  Past Medical History:  Diagnosis Date  . Bilateral carpal tunnel syndrome 07/27/14  . Bipolar disorder [facility])    geropsych admission to [facility] in Dec 2018  . Chronic diastolic CHF (congestive heart failure) ([facility])    a. 03/2017 Echo: EF 55-60%, no rwma, Gr1 DD, mild MR.  . Chronic low back pain 07/29/14  . Degenerative arthritis of lumbar spine 07/27/14  . Dementia ([facility])   . Depression   . Esophageal spasm 07/29/14  . GERD (gastroesophageal reflux disease)   . History of stress test    a. 04/2017 MV: low risk stress test.  EF 55-65%. Probable apical ant/apical, basal, and mid inflat attenuation artifact vs ischemia/scar.  . Hyperlipemia 07/29/14  . Hypertension   . Pneumonia   . Spinal stenosis 07/29/14    Past Surgical History:  Procedure Laterality Date  . HIP SURGERY Right   . INNER EAR SURGERY Right   . REPLACEMENT TOTAL KNEE BILATERAL Bilateral 07/27/14    Family History  Problem Relation Age of Onset  . Hypertension Mother   . Stroke Mother    Social History:  reports that she quit smoking about 43 years ago. Her smoking use included cigarettes. She has never used smokeless tobacco. She reports that she does not drink alcohol or use drugs.  Allergies:  Allergies  Allergen Reactions  . Aspirin Other (See Comments)    Unknown reaction  . Sulfa Antibiotics Other (See Comments)    Unknown reaction    (Not in a hospital admission)   No results found for this or any previous visit (from the past 48 hour(s)). No results found.  Review of Systems  Constitutional: Negative.   HENT: Negative.   Eyes: Negative.   Respiratory: Negative.   Cardiovascular: Negative.   Gastrointestinal: Negative.   Musculoskeletal: Negative.   Skin: Negative.   Neurological: Negative.   Psychiatric/Behavioral: Negative.     Blood pressure (!)  133/44, pulse 88, temperature 97.9 F (36.6 C), temperature source Oral, height 5' (1.524 m), weight 73.5 kg. Physical Exam  Nursing note and vitals reviewed. Constitutional: She appears well-developed and well-nourished.  HENT:  Head: Normocephalic and atraumatic.  Eyes: Pupils are equal, round, and reactive to light. Conjunctivae are normal.  Cardiovascular: Normal heart sounds.  Respiratory: Effort normal.  GI: Soft.  Musculoskeletal:        General: Normal range of motion.     Cervical back: Normal range of motion.  Neurological: She is alert.  Skin: Skin is warm and dry.  Psychiatric: Judgment normal. Her mood appears anxious. She exhibits abnormal recent memory.     Assessment/Plan She is doing well with her mood for the most part although we are noticing that overall cognition is getting worse.  We do not seem to be adding to that any with the ECT so I think it is still reasonable to continue given the benefit in controlling her profound depression but we will go to 4 weeks.  Mordecai Rasmussen, MD 04/11/2019, 11:20 AM

## 2019-04-14 ENCOUNTER — Other Ambulatory Visit: Payer: Self-pay

## 2019-04-14 MED ORDER — FUROSEMIDE 20 MG PO TABS: 20.0000 mg | ORAL_TABLET | Freq: Every day | ORAL | 0 refills | Status: DC

## 2019-05-19 ENCOUNTER — Telehealth: Payer: Self-pay

## 2019-05-19 ENCOUNTER — Other Ambulatory Visit
Admission: RE | Admit: 2019-05-19 | Discharge: 2019-05-19 | Disposition: A | Discharge status: Home / Self Care | Payer: Medicare Other | Source: Ambulatory Visit | Attending: Psychiatry | Admitting: Psychiatry

## 2019-05-19 DIAGNOSIS — Z20822 Contact with and (suspected) exposure to covid-19: Secondary | ICD-10-CM | POA: Diagnosis not present

## 2019-05-19 DIAGNOSIS — Z01812 Encounter for preprocedural laboratory examination: Secondary | ICD-10-CM | POA: Diagnosis present

## 2019-05-19 LAB — SARS CORONAVIRUS 2 (TAT 6-24 HRS): SARS Coronavirus 2: NEGATIVE

## 2019-05-20 ENCOUNTER — Other Ambulatory Visit: Payer: Self-pay | Admitting: Psychiatry

## 2019-05-21 ENCOUNTER — Other Ambulatory Visit: Payer: Self-pay

## 2019-05-21 ENCOUNTER — Encounter: Payer: Self-pay | Admitting: Certified Registered Nurse Anesthetist

## 2019-05-21 ENCOUNTER — Encounter
Admission: RE | Admit: 2019-05-21 | Discharge: 2019-05-21 | Disposition: A | Discharge status: Home / Self Care | Payer: Medicare Other | Source: Ambulatory Visit | Attending: Psychiatry | Admitting: Psychiatry

## 2019-05-21 DIAGNOSIS — Z882 Allergy status to sulfonamides status: Secondary | ICD-10-CM | POA: Insufficient documentation

## 2019-05-21 DIAGNOSIS — I5032 Chronic diastolic (congestive) heart failure: Secondary | ICD-10-CM | POA: Diagnosis not present

## 2019-05-21 DIAGNOSIS — I110 Hypertensive heart disease with heart failure: Secondary | ICD-10-CM | POA: Diagnosis not present

## 2019-05-21 DIAGNOSIS — K219 Gastro-esophageal reflux disease without esophagitis: Secondary | ICD-10-CM | POA: Insufficient documentation

## 2019-05-21 DIAGNOSIS — F319 Bipolar disorder, unspecified: Secondary | ICD-10-CM | POA: Diagnosis present

## 2019-05-21 DIAGNOSIS — F332 Major depressive disorder, 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 features: Secondary | ICD-10-CM

## 2019-05-21 DIAGNOSIS — Z87891 Personal history of nicotine dependence: Secondary | ICD-10-CM | POA: Insufficient documentation

## 2019-05-21 DIAGNOSIS — F0390 Unspecified dementia without behavioral disturbance: Secondary | ICD-10-CM | POA: Insufficient documentation

## 2019-05-21 MED ORDER — LABETALOL HCL 5 MG/ML IV SOLN
INTRAVENOUS | Status: DC | PRN
  Administered 2019-05-21: 20 mg via INTRAVENOUS

## 2019-05-21 MED ORDER — LABETALOL HCL 5 MG/ML IV SOLN
INTRAVENOUS | Status: AC
  Filled 2019-05-21: qty 4

## 2019-05-21 MED ORDER — METHOHEXITAL SODIUM 100 MG/10ML IV SOSY
PREFILLED_SYRINGE | INTRAVENOUS | Status: DC | PRN
  Administered 2019-05-21: 60 mg via INTRAVENOUS

## 2019-05-21 MED ORDER — IPRATROPIUM-ALBUTEROL 0.5-2.5 (3) MG/3ML IN SOLN: 3.0000 mL | Freq: Once | RESPIRATORY_TRACT | Status: AC

## 2019-05-21 MED ORDER — IPRATROPIUM-ALBUTEROL 0.5-2.5 (3) MG/3ML IN SOLN
RESPIRATORY_TRACT | Status: AC
  Administered 2019-05-21: 3 mL via RESPIRATORY_TRACT
  Filled 2019-05-21: qty 3

## 2019-05-21 MED ORDER — SODIUM CHLORIDE 0.9 % IV SOLN: 500.0000 mL | Freq: Once | INTRAVENOUS | Status: AC

## 2019-05-21 MED ORDER — SUCCINYLCHOLINE CHLORIDE 20 MG/ML IJ SOLN
INTRAMUSCULAR | Status: AC
  Filled 2019-05-21: qty 1

## 2019-05-21 MED ORDER — KETOROLAC TROMETHAMINE 30 MG/ML IJ SOLN: 30.0000 mg | Freq: Once | INTRAMUSCULAR | Status: DC

## 2019-05-21 MED ORDER — SUCCINYLCHOLINE CHLORIDE 20 MG/ML IJ SOLN
INTRAMUSCULAR | Status: DC | PRN
  Administered 2019-05-21: 80 mg via INTRAVENOUS

## 2019-05-21 MED ORDER — KETOROLAC TROMETHAMINE 30 MG/ML IJ SOLN
INTRAMUSCULAR | Status: AC
  Administered 2019-05-21: 30 mg
  Filled 2019-05-21: qty 1

## 2019-05-21 NOTE — Anesthesia Procedure Notes (Signed)
Procedure Name: General with mask airway Date/Time: 05/21/2019 11:12 AM Performed by: Rosanne Gutting, CRNA Pre-anesthesia Checklist: Patient identified, Emergency Drugs available, Suction available and Patient being monitored Patient Re-evaluated:Patient Re-evaluated prior to induction Oxygen Delivery Method: Circle system utilized Preoxygenation: Pre-oxygenation with 100% oxygen Induction Type: IV induction Ventilation: Mask ventilation without difficulty and Mask ventilation throughout procedure Airway Equipment and Method: Bite block Placement Confirmation: positive ETCO2 Dental Injury: Teeth and Oropharynx as per pre-operative assessment

## 2019-05-21 NOTE — Anesthesia Preprocedure Evaluation (Signed)
Anesthesia Evaluation  Patient identified by MRN, date of birth, ID band Patient awake    Reviewed: Allergy & Precautions, H&P , NPO status , Patient's Chart, lab work & pertinent test results  History of Anesthesia Complications Negative for: history of anesthetic complications  Airway Mallampati: II   Neck ROM: full    Dental  (+) Missing, Edentulous Upper   Pulmonary shortness of breath and with exertion, pneumonia, former smoker,           Cardiovascular hypertension, + CAD and +CHF (HFpEF)  (-) dysrhythmias      Neuro/Psych PSYCHIATRIC DISORDERS Depression Bipolar Disorder negative neurological ROS     GI/Hepatic Neg liver ROS, GERD  Controlled,  Endo/Other  negative endocrine ROS  Renal/GU negative Renal ROS  negative genitourinary   Musculoskeletal   Abdominal   Peds  Hematology negative hematology ROS (+)   Anesthesia Other Findings Past Medical History: 07/27/14: Bilateral carpal tunnel syndrome No date: Bipolar disorder ([facility])     Comment:  geropsych admission to [facility] in Dec 2018 No date: Chronic diastolic CHF (congestive heart failure) ([facility])     Comment:  a. 03/2017 Echo: EF 55-60%, no rwma, Gr1 DD, mild MR. 07/29/14: Chronic low back pain 07/27/14: Degenerative arthritis of lumbar spine No date: Dementia ([facility]) No date: Depression 07/29/14: Esophageal spasm No date: GERD (gastroesophageal reflux disease) No date: History of stress test     Comment:  a. 04/2017 MV: low risk stress test.  EF 55-65%. Probable              apical ant/apical, basal, and mid inflat attenuation               artifact vs ischemia/scar. 07/29/14: Hyperlipemia No date: Hypertension No date: Pneumonia 07/29/14: Spinal stenosis   Reproductive/Obstetrics negative OB ROS                             Anesthesia Physical  Anesthesia Plan  ASA: III  Anesthesia Plan: General   Post-op Pain Management:     Induction: Intravenous  PONV Risk Score and Plan: 3 and Propofol infusion and TIVA  Airway Management Planned: Mask  Additional Equipment:   Intra-op Plan:   Post-operative Plan:   Informed Consent: I have reviewed the patients History and Physical, chart, labs and discussed the procedure including the risks, benefits and alternatives for the proposed anesthesia with the patient or authorized representative who has indicated his/her understanding and acceptance.       Plan Discussed with: CRNA and Surgeon  Anesthesia Plan Comments:         Anesthesia Quick Evaluation

## 2019-05-21 NOTE — Discharge Instructions (Signed)
1)  The drugs that you have been given will stay in your system until tomorrow so for the       next 24 hours you should not:  A. Drive an automobile  B. Make any legal decisions  C. Drink any alcoholic beverages  2)  You may resume your regular meals upon return home.  3)  A responsible adult must take you home.  Someone should stay with you for a few          hours, then be available by phone for the remainder of the treatment day.  4)  You May experience any of the following symptoms:  Headache, Nausea and a dry mouth (due to the medications you were given),  temporary memory loss and some confusion, or sore muscles (a warm bath  should help this).  If you you experience any of these symptoms let us know on                your return visit.  5)  Report any of the following: any acute discomfort, severe headache, or temperature        greater than 100.5 F.   Also report any unusual redness, swelling, drainage, or pain         at your IV site.    You may report Symptoms to:  ECT PROGRAM- [facility]          Phone: [phone], ECT Department           or Dr. Clapac's office [phone]  6)  Your next ECT Treatment is Wednesday March 17   We will call 2 days prior to your scheduled appointment for arrival times.  7)  Nothing to eat or drink after midnight the night before your procedure.  8)  Take     With a sip of water the morning of your procedure.  9)  Other Instructions: Call [phone] to cancel the morning of your procedure due         to illness or emergency.  10) We will call within 72 hours to assess how you are feeling.  

## 2019-05-21 NOTE — Transfer of Care (Signed)
Immediate Anesthesia Transfer of Care Note  Patient: Jenna Dudley  Procedure(s) Performed: ECT TX  Patient Location: PACU  Anesthesia Type:General  Level of Consciousness: drowsy  Airway & Oxygen Therapy: Patient Spontanous Breathing and Patient connected to face mask oxygen  Post-op Assessment: Report given to RN and Post -op Vital signs reviewed and stable  Post vital signs: Reviewed and stable  Last Vitals:  Vitals Value Taken Time  BP 129/65 05/21/19 1125  Temp    Pulse 94 05/21/19 1126  Resp 28 05/21/19 1126  SpO2 100 % 05/21/19 1126  Vitals shown include unvalidated device data.  Last Pain:  Vitals:   05/21/19 0914  TempSrc:   PainSc: 0-No pain         Complications: No apparent anesthesia complications

## 2019-05-21 NOTE — H&P (Signed)
Jenna Dudley is an 84 y.o. female.   Chief Complaint: no specific complaint HPI: recurent psychotic depression  Past Medical History:  Diagnosis Date  . Bilateral carpal tunnel syndrome 07/27/14  . Bipolar disorder [facility])    geropsych admission to [facility] in Dec 2018  . Chronic diastolic CHF (congestive heart failure) (HCC)    a. 03/2017 Echo: EF 55-60%, no rwma, Gr1 DD, mild MR.  . Chronic low back pain 07/29/14  . Degenerative arthritis of lumbar spine 07/27/14  . Dementia (HCC)   . Depression   . Esophageal spasm 07/29/14  . GERD (gastroesophageal reflux disease)   . History of stress test    a. 04/2017 MV: low risk stress test.  EF 55-65%. Probable apical ant/apical, basal, and mid inflat attenuation artifact vs ischemia/scar.  . Hyperlipemia 07/29/14  . Hypertension   . Pneumonia   . Spinal stenosis 07/29/14    Past Surgical History:  Procedure Laterality Date  . HIP SURGERY Right   . INNER EAR SURGERY Right   . REPLACEMENT TOTAL KNEE BILATERAL Bilateral 07/27/14    Family History  Problem Relation Age of Onset  . Hypertension Mother   . Stroke Mother    Social History:  reports that she quit smoking about 43 years ago. Her smoking use included cigarettes. She has never used smokeless tobacco. She reports that she does not drink alcohol or use drugs.  Allergies:  Allergies  Allergen Reactions  . Aspirin Other (See Comments)    Unknown reaction  . Sulfa Antibiotics Other (See Comments)    Unknown reaction    (Not in a hospital admission)   Results for orders placed or performed during the hospital encounter of 05/19/19 (from the past 48 hour(s))  SARS CORONAVIRUS 2 (TAT 6-24 HRS) Nasopharyngeal Nasopharyngeal Swab     Status: None   Collection Time: 05/19/19  9:58 AM   Specimen: Nasopharyngeal Swab  Result Value Ref Range   SARS Coronavirus 2 NEGATIVE NEGATIVE    Comment: (NOTE) SARS-CoV-2 target nucleic acids are NOT DETECTED. The SARS-CoV-2 RNA is generally  detectable in upper and lower respiratory specimens during the acute phase of infection. Negative results do not preclude SARS-CoV-2 infection, do not rule out co-infections with other pathogens, and should not be used as the sole basis for treatment or other patient management decisions. Negative results must be combined with clinical observations, patient history, and epidemiological information. The expected result is Negative. Fact Sheet for Patients: HairSlick.no Fact Sheet for Healthcare Providers: quierodirigir.com This test is not yet approved or cleared by the [place] FDA and  has been authorized for detection and/or diagnosis of SARS-CoV-2 by FDA under an Emergency Use Authorization (EUA). This EUA will remain  in effect (meaning this test can be used) for the duration of the COVID-19 declaration under Section 56 4(b)(1) of the Act, 21 U.S.C. section 360bbb-3(b)(1), unless the authorization is terminated or revoked sooner. Performed at [facility], [address], [place], [place] [address]    No results found.  Review of Systems  Constitutional: Negative.   HENT: Negative.   Eyes: Negative.   Respiratory: Negative.   Cardiovascular: Negative.   Gastrointestinal: Negative.   Musculoskeletal: Negative.   Skin: Negative.   Neurological: Negative.   Psychiatric/Behavioral: Negative.     Blood pressure 133/87, pulse 93, temperature 97.8 F (36.6 C), temperature source Oral, resp. rate 18, SpO2 93 %. Physical Exam  Nursing note and vitals reviewed. Constitutional: She appears well-developed and  well-nourished.  HENT:  Head: Normocephalic and atraumatic.  Eyes: Pupils are equal, round, and reactive to light. Conjunctivae are normal.  Cardiovascular: Regular rhythm and normal heart sounds.  Respiratory: Effort normal.  GI: Soft.  Musculoskeletal:        General: Normal range of motion.     Cervical  back: Normal range of motion.  Neurological: She is alert.  Skin: Skin is warm and dry.  Psychiatric: She has a normal mood and affect. Her speech is normal and behavior is normal. Judgment and thought content normal. Cognition and memory are impaired.     Assessment/Plan Doing well on schedule. Cognition stable. Treatm,ent today and return 3 weeks  Alethia Berthold, MD 05/21/2019, 9:35 AM

## 2019-05-21 NOTE — Anesthesia Postprocedure Evaluation (Signed)
Anesthesia Post Note  Patient: Jenna Dudley  Procedure(s) Performed: ECT TX  Patient location during evaluation: PACU Anesthesia Type: General Level of consciousness: awake and alert Pain management: pain level controlled Vital Signs Assessment: post-procedure vital signs reviewed and stable Respiratory status: spontaneous breathing, nonlabored ventilation, respiratory function stable and patient connected to nasal cannula oxygen Cardiovascular status: blood pressure returned to baseline and stable Postop Assessment: no apparent nausea or vomiting Anesthetic complications: no     Last Vitals:  Vitals:   05/21/19 1225 05/21/19 1235  BP: 113/62 114/85  Pulse: 86 87  Resp: (!) 32   Temp:    SpO2: 92% 94%    Last Pain:  Vitals:   05/21/19 1235  TempSrc:   PainSc: 0-No pain                 Lenard Simmer

## 2019-05-21 NOTE — Procedures (Signed)
ECT SERVICES Physician's Interval Evaluation & Treatment Note  Patient Identification: Jenna Dudley MRN:  [id number] Date of Evaluation:  05/21/2019 TX #: [id number]  MADRS:   MMSE:   P.E. Findings:  No change to physical  Psychiatric Interval Note:  Calm lucid.  Not psychotic not suicidal not acting bizarre.  Subjective:  Patient is a 84 y.o. female seen for evaluation for Electroconvulsive Therapy. Only her chronic back pain  Treatment Summary:   [x]   Right Unilateral             []  Bilateral   % Energy : 0.3 ms 90%   Impedance: 2240 ohms  Seizure Energy Index: 4540 V squared  Postictal Suppression Index: 43%  Seizure Concordance Index: 83%  Medications  Pre Shock: Labetalol 20 mg Toradol 30 mg Brevital 60 mg succinylcholine 80 mg  Post Shock:    Seizure Duration: 15 seconds EMG 23 seconds EEG   Comments: Follow-up in 3 weeks  Lungs:  [x]   Clear to auscultation               []  Other:   Heart:    [x]   Regular rhythm             []  irregular rhythm    [x]   Previous H&P reviewed, patient examined and there are NO CHANGES                 []   Previous H&P reviewed, patient examined and there are changes noted.   , MD 2/24/202111:15 AM

## 2019-05-27 NOTE — Progress Notes (Signed)
Cardiology Office Note    Date:  05/28/2019   ID:  Jenna Dudley, DOB May 24, 1935, MRN [id number]  PCP:  Aderoju, Marin Olp, MD  Cardiologist:  Lorine Bears, MD  Electrophysiologist:  None   Chief Complaint: Follow-up  History of Present Illness:   Jenna Dudley is a 84 y.o. female with history of HFpEF, bipolar disorder, dementia, HTN, HLD, GERD, and chronic low back pain who presents for follow-up of her diastolic CHF.  She was admitted to the hospital in early 2019 with a 1 week history of dyspnea and volume overload.  Echo showed a normal LV systolic function with grade 1 diastolic dysfunction.  She was noted to have a mildly elevated troponin with a peak of 0.06.  She was not felt to be a good candidate for ischemic evaluation secondary to dementia.  Follow-up outpatient stress testing was a low risk.  She was most recently seen virtually in 06/2018 and was doing well with stable weight at home.  She is accompanied by her husband today who does most of the talking and provides the history in the setting of the patient's underlying psychiatric illness.  Indicates she has done quite well since she was last seen.  He has noted some shortness of breath and her weight appears to be up about 5 pounds from her baseline.  He has attributed both of these to deconditioning and sedentary lifestyle in the setting of the COVID-19 pandemic.  That said, she has been out of Lasix and Lopressor for a little over 1 month.  She does watch her salt intake and drinks less than 2 L of fluid per day.  There has been no lower extremity swelling.  She has stable two-pillow orthopnea.   Labs independently reviewed: 12/2018 - Hgb 11.2, PLT 220 08/2018 - potassium 4.3, BUN 22, serum creatinine 1.0, A1c 5.6, TC 141, TG 77, HDL 57, LDL 69 11/2017 - AST 155, ALT 174, albumin 3.5 06/2017 - TSH normal  Past Medical History:  Diagnosis Date  . Bilateral carpal tunnel syndrome 07/27/14  . Bipolar disorder [facility])      geropsych admission to [facility] in Dec 2018  . Chronic diastolic CHF (congestive heart failure) (HCC)    a. 03/2017 Echo: EF 55-60%, no rwma, Gr1 DD, mild MR.  . Chronic low back pain 07/29/14  . Degenerative arthritis of lumbar spine 07/27/14  . Dementia (HCC)   . Depression   . Esophageal spasm 07/29/14  . GERD (gastroesophageal reflux disease)   . History of stress test    a. 04/2017 MV: low risk stress test.  EF 55-65%. Probable apical ant/apical, basal, and mid inflat attenuation artifact vs ischemia/scar.  . Hyperlipemia 07/29/14  . Hypertension   . Pneumonia   . Spinal stenosis 07/29/14    Past Surgical History:  Procedure Laterality Date  . HIP SURGERY Right   . INNER EAR SURGERY Right   . REPLACEMENT TOTAL KNEE BILATERAL Bilateral 07/27/14    Current Medications: Current Meds  Medication Sig  . acetaminophen-codeine (TYLENOL #3) 300-30 MG per tablet Take 1 tablet by mouth every 8 (eight) hours as needed for moderate pain or severe pain.  . cholecalciferol (VITAMIN D) 1000 units tablet Take 1,000 Units by mouth daily.  . ferrous sulfate 325 (65 FE) MG EC tablet Take 325 mg by mouth daily with breakfast.  . furosemide (LASIX) 20 MG tablet Take 1 tablet (20 mg total) by mouth daily. Please schedule office visit for further refills.  Thank you!  Marland Kitchen losartan (COZAAR) 25 MG tablet Take 1 tablet (25 mg total) by mouth daily.  . magnesium oxide (MAG-OX) 400 MG tablet Take 400 mg by mouth daily.  . metoprolol tartrate (LOPRESSOR) 50 MG tablet Take 1 tablet (50 mg total) by mouth 2 (two) times daily. *NEEDS OFFICE VISIT FOR FURTHER REFILLS*  . mirtazapine (REMERON) 45 MG tablet Take 1 tablet (45 mg total) by mouth at bedtime.  Marland Kitchen OLANZapine (ZYPREXA) 20 MG tablet Take 1 tablet (20 mg total) by mouth at bedtime.  Marland Kitchen omeprazole (PRILOSEC) 20 MG capsule Take 20 mg by mouth 2 (two) times daily before a meal.   . simvastatin (ZOCOR) 40 MG tablet Take 40 mg by mouth at bedtime.   Marland Kitchen venlafaxine XR  (EFFEXOR-XR) 150 MG 24 hr capsule TAKE 1 CAPSULE BY MOUTH ONCE DAILY WITH BREAKFAST    Allergies:   Aspirin and Sulfa antibiotics   Social History   Socioeconomic History  . Marital status: Married    Spouse name: Not on file  . Number of children: Not on file  . Years of education: Not on file  . Highest education level: Not on file  Occupational History    Comment: retired  Tobacco Use  . Smoking status: Former Smoker    Types: Cigarettes    Quit date: 07/29/1975    Years since quitting: 43.8  . Smokeless tobacco: Never Used  Substance and Sexual Activity  . Alcohol use: No    Alcohol/week: 0.0 standard drinks  . Drug use: No  . Sexual activity: Never    Comment: postmenopause  Other Topics Concern  . Not on file  Social History Narrative  . Not on file   Social Determinants of Health   Financial Resource Strain:   . Difficulty of Paying Living Expenses: Not on file  Food Insecurity:   . Worried About Programme researcher, broadcasting/film/video in the Last Year: Not on file  . Ran Out of Food in the Last Year: Not on file  Transportation Needs:   . Lack of Transportation (Medical): Not on file  . Lack of Transportation (Non-Medical): Not on file  Physical Activity:   . Days of Exercise per Week: Not on file  . Minutes of Exercise per Session: Not on file  Stress:   . Feeling of Stress : Not on file  Social Connections:   . Frequency of Communication with Friends and Family: Not on file  . Frequency of Social Gatherings with Friends and Family: Not on file  . Attends Religious Services: Not on file  . Active Member of Clubs or Organizations: Not on file  . Attends Banker Meetings: Not on file  . Marital Status: Not on file     Family History:  The patient's family history includes Hypertension in her mother; Stroke in her mother.  ROS:   Review of Systems  Unable to perform ROS: Psychiatric disorder     EKGs/Labs/Other Studies Reviewed:    Studies reviewed were  summarized above. The additional studies were reviewed today:  2D echo 03/2017: - Left ventricle: The cavity size was normal. There was mild  concentric hypertrophy. Systolic function was normal. The  estimated ejection fraction was in the range of 55% to 60%. Wall  motion was normal; there were no regional wall motion  abnormalities. Doppler parameters are consistent with abnormal  left ventricular relaxation (grade 1 diastolic dysfunction).  - Mitral valve: Calcified annulus. There was mild regurgitation.  ___________  Lexiscan MPI 04/2017:  Low risk, probably normal myocardial perfusion stress test.  There is a small in size, moderate in severity, partial reversible defect involving the apical anterior and apical segments that may represent artifact (attenuation and apical thinning) but cannot rule out ischemia and scar.  There is a small in size, mild in severity, fixed basal and mid inferolateral defect most likely representing artifact (attenuation and misregistration) and less likely scar.  The left ventricular ejection fraction is normal (55-65%) with normal regional wall motion.   EKG:  EKG is ordered today.  The EKG ordered today demonstrates sinus tachycardia with occasional PACs, low voltage QRS, nonspecific inferior, anterior, and lateral ST-T changes which were previously noted in 07/2017  Recent Labs: No results found for requested labs within last 8760 hours.  Recent Lipid Panel    Component Value Date/Time   CHOL 149 09/18/2014 0655   CHOL 144 03/30/2012 0512   TRIG 94 09/18/2014 0655   TRIG 103 03/30/2012 0512   HDL 33 (L) 09/18/2014 0655   HDL 33 (L) 03/30/2012 0512   CHOLHDL 4.5 09/18/2014 0655   VLDL 19 09/18/2014 0655   VLDL 21 03/30/2012 0512   LDLCALC 97 09/18/2014 0655   LDLCALC 90 03/30/2012 0512    PHYSICAL EXAM:    VS:  BP 132/64 (BP Location: Left Arm, Patient Position: Sitting, Cuff Size: Normal)   Pulse (!) 112   Ht 5' (1.524 m)    Wt 160 lb 8 oz (72.8 kg)   LMP  (LMP Unknown)   SpO2 96% Comment: Room air  BMI 31.35 kg/m   BMI: Body mass index is 31.35 kg/m.  Physical Exam  Constitutional: She is oriented to person, place, and time. She appears well-developed and well-nourished.  HENT:  Head: Normocephalic and atraumatic.  Eyes: Right eye exhibits no discharge. Left eye exhibits no discharge.  Neck: No JVD present.  Cardiovascular: Regular rhythm, S1 normal, S2 normal and normal heart sounds. Tachycardia present. Exam reveals no distant heart sounds, no friction rub, no midsystolic click and no opening snap.  No murmur heard. Pulses:      Posterior tibial pulses are 2+ on the right side and 2+ on the left side.  Pulmonary/Chest: Effort normal and breath sounds normal. No respiratory distress. She has no decreased breath sounds. She has no wheezes. She has no rales. She exhibits no tenderness.  Abdominal: Soft. She exhibits no distension. There is no abdominal tenderness.  Musculoskeletal:        General: No edema.     Cervical back: Normal range of motion.  Neurological: She is alert and oriented to person, place, and time.  Skin: Skin is warm and dry. No cyanosis. Nails show no clubbing.  Psychiatric: She has a normal mood and affect. Her speech is normal and behavior is normal. Judgment and thought content normal.    Wt Readings from Last 3 Encounters:  05/28/19 160 lb 8 oz (72.8 kg)  07/25/18 154 lb 2 oz (69.9 kg)  04/23/18 154 lb (69.9 kg)     ASSESSMENT & PLAN:   1. HFpEF: She appears relatively euvolemic and well compensated.  Her weight may be up approximately 5 pounds from her baseline home weight.  Her weight is up 15 pounds today when compared to her last clinic weight.  She has been out of Lasix and Lopressor for a little over the past month.  There has been some increase in shortness of breath though this has been attributed to  sedentary lifestyle in the setting of the COVID-19 pandemic.  Her  tachycardic heart rates are likely in the setting of being off metoprolol.  With a pulse ox of 96% on room air and no recent prolonged sedentary timeframes, vascular trauma, or hypercoagulable state PE is less likely.  We will check an echo.  She will be restarted on Lasix and metoprolol.  Follow-up BMP in 1 week.  2. HTN: Blood pressure is reasonably controlled today.  Resume Lasix and Lopressor as outlined above.  She remains on losartan.  3. HLD: LDL of 69 from 08/2018.  Remains on simvastatin.  Followed by PCP.  4. Bipolar disorder/dementia: Stable per the husband.  Followed by PCP.  Disposition: F/u with Dr. Fletcher Anon in 3 months.   Medication Adjustments/Labs and Tests Ordered: Current medicines are reviewed at length with the patient today.  Concerns regarding medicines are outlined above. Medication changes, Labs and Tests ordered today are summarized above and listed in the Patient Instructions accessible in Encounters.   Signed, Christell Faith, PA-C 05/28/2019 11:23 AM     [facility] [address] [place] [place], [place] [address] [phone]

## 2019-05-28 ENCOUNTER — Other Ambulatory Visit: Payer: Self-pay

## 2019-05-28 ENCOUNTER — Ambulatory Visit (INDEPENDENT_AMBULATORY_CARE_PROVIDER_SITE_OTHER): Payer: Medicare Other | Admitting: Physician Assistant

## 2019-05-28 ENCOUNTER — Encounter: Payer: Self-pay | Admitting: Physician Assistant

## 2019-05-28 VITALS — BP 132/64 | HR 112 | Ht 60.0 in | Wt 160.5 lb

## 2019-05-28 DIAGNOSIS — E785 Hyperlipidemia, unspecified: Secondary | ICD-10-CM | POA: Diagnosis not present

## 2019-05-28 DIAGNOSIS — R0600 Dyspnea, unspecified: Secondary | ICD-10-CM | POA: Diagnosis not present

## 2019-05-28 DIAGNOSIS — I5032 Chronic diastolic (congestive) heart failure: Secondary | ICD-10-CM | POA: Diagnosis not present

## 2019-05-28 DIAGNOSIS — I10 Essential (primary) hypertension: Secondary | ICD-10-CM

## 2019-05-28 DIAGNOSIS — F0390 Unspecified dementia without behavioral disturbance: Secondary | ICD-10-CM

## 2019-05-28 DIAGNOSIS — F319 Bipolar disorder, unspecified: Secondary | ICD-10-CM

## 2019-05-28 MED ORDER — METOPROLOL TARTRATE 50 MG PO TABS: 50.0000 mg | ORAL_TABLET | Freq: Two times a day (BID) | ORAL | 3 refills | Status: DC

## 2019-05-28 MED ORDER — FUROSEMIDE 20 MG PO TABS: 20.0000 mg | ORAL_TABLET | Freq: Every day | ORAL | 3 refills | Status: DC

## 2019-05-28 NOTE — Patient Instructions (Signed)
Medication Instructions:  Your physician recommends that you continue on your current medications as directed. Please refer to the Current Medication list given to you today.  *If you need a refill on your cardiac medications before your next appointment, please call your pharmacy*   Lab Work: Your physician recommends that you return for lab work in: 1 week at the medical mall. ([facility])  No appt is needed. Hours are M-F 7AM- 6 PM.  If you have labs (blood work) drawn today and your tests are completely normal, you will receive your results only by: Marland Kitchen MyChart Message (if you have MyChart) OR . A paper copy in the mail If you have any lab test that is abnormal or we need to change your treatment, we will call you to review the results.   Testing/Procedures: 1- Echo  Please return to [facility] on ______________ at _______________ AM/PM for an Echocardiogram. Your physician has requested that you have an echocardiogram. Echocardiography is a painless test that uses sound waves to create images of your heart. It provides your doctor with information about the size and shape of your heart and how well your heart's chambers and valves are working. This procedure takes approximately one hour. There are no restrictions for this procedure. Please note; depending on visual quality an IV may need to be placed.     Follow-Up: At [facility], you and your health needs are our priority.  As part of our continuing mission to provide you with exceptional heart care, we have created designated Provider Care Teams.  These Care Teams include your primary Cardiologist (physician) and Advanced Practice Providers (APPs -  Physician Assistants and Nurse Practitioners) who all work together to provide you with the care you need, when you need it.  We recommend signing up for the patient portal called "MyChart".  Sign up information is provided on this After Visit Summary.  MyChart is used to  connect with patients for Virtual Visits (Telemedicine).  Patients are able to view lab/test results, encounter notes, upcoming appointments, etc.  Non-urgent messages can be sent to your provider as well.   To learn more about what you can do with MyChart, go to [email or web address].    Your next appointment:   3 month(s)  The format for your next appointment:   In Person  Provider:    You may see Lorine Bears, MD or Eula Listen, PA-C.

## 2019-06-09 ENCOUNTER — Other Ambulatory Visit
Admission: RE | Admit: 2019-06-09 | Discharge: 2019-06-09 | Disposition: A | Discharge status: Home / Self Care | Payer: Medicare Other | Source: Ambulatory Visit | Attending: Psychiatry | Admitting: Psychiatry

## 2019-06-09 ENCOUNTER — Other Ambulatory Visit: Payer: Self-pay

## 2019-06-09 DIAGNOSIS — Z01812 Encounter for preprocedural laboratory examination: Secondary | ICD-10-CM | POA: Diagnosis present

## 2019-06-09 DIAGNOSIS — Z20822 Contact with and (suspected) exposure to covid-19: Secondary | ICD-10-CM | POA: Diagnosis not present

## 2019-06-09 LAB — SARS CORONAVIRUS 2 (TAT 6-24 HRS): SARS Coronavirus 2: NEGATIVE

## 2019-06-13 ENCOUNTER — Other Ambulatory Visit: Payer: Self-pay | Admitting: Psychiatry

## 2019-06-13 ENCOUNTER — Encounter: Payer: Self-pay | Admitting: Certified Registered"

## 2019-06-13 ENCOUNTER — Other Ambulatory Visit: Payer: Self-pay

## 2019-06-13 ENCOUNTER — Encounter
Admission: RE | Admit: 2019-06-13 | Discharge: 2019-06-13 | Disposition: A | Discharge status: Home / Self Care | Payer: Medicare Other | Source: Ambulatory Visit | Attending: Psychiatry | Admitting: Psychiatry

## 2019-06-13 DIAGNOSIS — Z87891 Personal history of nicotine dependence: Secondary | ICD-10-CM | POA: Insufficient documentation

## 2019-06-13 DIAGNOSIS — F332 Major depressive disorder, 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 features: Secondary | ICD-10-CM | POA: Diagnosis not present

## 2019-06-13 DIAGNOSIS — F0390 Unspecified dementia without behavioral disturbance: Secondary | ICD-10-CM | POA: Diagnosis not present

## 2019-06-13 DIAGNOSIS — K219 Gastro-esophageal reflux disease without esophagitis: Secondary | ICD-10-CM | POA: Insufficient documentation

## 2019-06-13 DIAGNOSIS — F319 Bipolar disorder, unspecified: Secondary | ICD-10-CM | POA: Diagnosis present

## 2019-06-13 DIAGNOSIS — I5032 Chronic diastolic (congestive) heart failure: Secondary | ICD-10-CM | POA: Insufficient documentation

## 2019-06-13 DIAGNOSIS — F3160 Bipolar disorder, current episode mixed, unspecified: Secondary | ICD-10-CM

## 2019-06-13 DIAGNOSIS — Z882 Allergy status to sulfonamides status: Secondary | ICD-10-CM | POA: Diagnosis not present

## 2019-06-13 DIAGNOSIS — I110 Hypertensive heart disease with heart failure: Secondary | ICD-10-CM | POA: Diagnosis not present

## 2019-06-13 MED ORDER — SODIUM CHLORIDE 0.9 % IV SOLN
500.0000 mL | Freq: Once | INTRAVENOUS | Status: AC
  Administered 2019-06-13: 500 mL via INTRAVENOUS

## 2019-06-13 MED ORDER — KETOROLAC TROMETHAMINE 30 MG/ML IJ SOLN
INTRAMUSCULAR | Status: AC
  Administered 2019-06-13: 30 mg via INTRAVENOUS
  Filled 2019-06-13: qty 1

## 2019-06-13 MED ORDER — METHOHEXITAL SODIUM 100 MG/10ML IV SOSY
PREFILLED_SYRINGE | INTRAVENOUS | Status: DC | PRN
  Administered 2019-06-13: 60 mg via INTRAVENOUS

## 2019-06-13 MED ORDER — LABETALOL HCL 5 MG/ML IV SOLN
INTRAVENOUS | Status: DC | PRN
  Administered 2019-06-13: 20 mg via INTRAVENOUS

## 2019-06-13 MED ORDER — SUCCINYLCHOLINE CHLORIDE 20 MG/ML IJ SOLN
INTRAMUSCULAR | Status: DC | PRN
  Administered 2019-06-13: 80 mg via INTRAVENOUS

## 2019-06-13 MED ORDER — SODIUM CHLORIDE 0.9 % IV SOLN: INTRAVENOUS | Status: DC | PRN

## 2019-06-13 MED ORDER — KETOROLAC TROMETHAMINE 30 MG/ML IJ SOLN: 30.0000 mg | Freq: Once | INTRAMUSCULAR | Status: AC

## 2019-06-13 NOTE — Anesthesia Preprocedure Evaluation (Signed)
Anesthesia Evaluation  Patient identified by MRN, date of birth, ID band Patient awake    Reviewed: Allergy & Precautions, NPO status , Patient's Chart, lab work & pertinent test results  History of Anesthesia Complications Negative for: history of anesthetic complications  Airway Mallampati: II  TM Distance: >3 FB Neck ROM: Full    Dental  (+) Edentulous Upper, Poor Dentition   Pulmonary neg sleep apnea, neg COPD, former smoker,    breath sounds clear to auscultation- rhonchi (-) wheezing      Cardiovascular hypertension, Pt. on medications +CHF  (-) CAD, (-) Past MI, (-) Cardiac Stents and (-) CABG  Rhythm:Regular Rate:Normal - Systolic murmurs and - Diastolic murmurs Echo 04/22/17: - Left ventricle: The cavity size was normal. There was mild   concentric hypertrophy. Systolic function was normal. The   estimated ejection fraction was in the range of 55% to 60%. Wall   motion was normal; there were no regional wall motion   abnormalities. Doppler parameters are consistent with abnormal   left ventricular relaxation (grade 1 diastolic dysfunction). - Mitral valve: Calcified annulus. There was mild regurgitation.   Neuro/Psych PSYCHIATRIC DISORDERS Depression Bipolar Disorder Dementia negative neurological ROS     GI/Hepatic Neg liver ROS, GERD  ,  Endo/Other  negative endocrine ROSneg diabetes  Renal/GU negative Renal ROS     Musculoskeletal  (+) Arthritis ,   Abdominal (+) - obese,   Peds  Hematology negative hematology ROS (+)   Anesthesia Other Findings Past Medical History: 07/27/14: Bilateral carpal tunnel syndrome No date: Bipolar disorder [facility])     Comment:  geropsych admission to [facility] in Dec 2018 No date: Chronic diastolic CHF (congestive heart failure) (HCC)     Comment:  a. 03/2017 Echo: EF 55-60%, no rwma, Gr1 DD, mild MR. 07/29/14: Chronic low back pain 07/27/14: Degenerative arthritis of lumbar  spine No date: Dementia (HCC) No date: Depression 07/29/14: Esophageal spasm No date: GERD (gastroesophageal reflux disease) 07/29/14: Hyperlipemia No date: Hypertension No date: Pneumonia 07/29/14: Spinal stenosis   Reproductive/Obstetrics                             Anesthesia Physical  Anesthesia Plan  ASA: III  Anesthesia Plan: General   Post-op Pain Management:    Induction: Intravenous  PONV Risk Score and Plan: 2 and Ondansetron  Airway Management Planned: Mask  Additional Equipment:   Intra-op Plan:   Post-operative Plan:   Informed Consent: I have reviewed the patients History and Physical, chart, labs and discussed the procedure including the risks, benefits and alternatives for the proposed anesthesia with the patient or authorized representative who has indicated his/her understanding and acceptance.     Dental advisory given  Plan Discussed with: CRNA and Anesthesiologist  Anesthesia Plan Comments:         Anesthesia Quick Evaluation

## 2019-06-13 NOTE — Anesthesia Postprocedure Evaluation (Signed)
Anesthesia Post Note  Patient: ELIANNA WINDOM  Procedure(s) Performed: ECT TX  Patient location during evaluation: PACU Anesthesia Type: General Level of consciousness: awake and alert Pain management: pain level controlled Vital Signs Assessment: post-procedure vital signs reviewed and stable Respiratory status: spontaneous breathing, nonlabored ventilation and respiratory function stable Cardiovascular status: blood pressure returned to baseline and stable Postop Assessment: no signs of nausea or vomiting Anesthetic complications: no     Last Vitals:  Vitals:   06/13/19 1143 06/13/19 1153  BP: (!) 115/58 (!) 118/47  Pulse: 82 77  Resp: (!) 24 (!) 22  Temp: 36.8 C   SpO2: 92% 93%    Last Pain:  Vitals:   06/13/19 1200  TempSrc:   PainSc: 0-No pain                 Mayra Brahm

## 2019-06-13 NOTE — Anesthesia Procedure Notes (Signed)
Procedure Name: General with mask airway Performed by: Mohammed Kindle, CRNA Pre-anesthesia Checklist: Patient identified, Emergency Drugs available, Suction available, Patient being monitored and Timeout performed Patient Re-evaluated:Patient Re-evaluated prior to induction Oxygen Delivery Method: Circle system utilized Preoxygenation: Pre-oxygenation with 100% oxygen Induction Type: IV induction Ventilation: Mask ventilation without difficulty Placement Confirmation: positive ETCO2,  CO2 detector and breath sounds checked- equal and bilateral Dental Injury: Teeth and Oropharynx as per pre-operative assessment

## 2019-06-13 NOTE — Procedures (Signed)
ECT SERVICES Physician's Interval Evaluation & Treatment Note  Patient Identification: Jenna Dudley MRN:  [id number] Date of Evaluation:  06/13/2019 TX #: 36  MADRS:   MMSE:   P.E. Findings:  No real change except that I think she is gradually having more problems with her memory.  Psychiatric Interval Note:  No sign of acute depression or psychosis.  Subjective:  Patient is a 84 y.o. female seen for evaluation for Electroconvulsive Therapy. Patient herself has no complaints  Treatment Summary:   [x]   Right Unilateral             []  Bilateral   % Energy : 0.3 ms 90%   Impedance: 2410 ohms  Seizure Energy Index: No reading  Postictal Suppression Index: No reading  Seizure Concordance Index: No reading  Medications  Pre Shock: Labetalol 20 mg Toradol 30 mg Brevital 60 mg succinylcholine 80 mg  Post Shock:    Seizure Duration: 22 seconds EMG 53 seconds EEG   Comments: The computer for what ever reason did not read the seizure even though it was very clear-cut.  There was a little interference in one of the leads which may have been the reason.  In any case this was a good quality seizure pretty typical for her.  Lungs:  [x]   Clear to auscultation               []  Other:   Heart:    [x]   Regular rhythm             []  irregular rhythm    [x]   Previous H&P reviewed, patient examined and there are NO CHANGES                 []   Previous H&P reviewed, patient examined and there are changes noted.   , MD 3/19/20213:57 PM

## 2019-06-13 NOTE — H&P (Signed)
Jenna Dudley is an 85 y.o. female.   Chief Complaint: no complaint HPI: recurrent psychotic depression  Past Medical History:  Diagnosis Date  . Bilateral carpal tunnel syndrome 07/27/14  . Bipolar disorder [facility])    geropsych admission to [facility] in Dec 2018  . Chronic diastolic CHF (congestive heart failure) (HCC)    a. 03/2017 Echo: EF 55-60%, no rwma, Gr1 DD, mild MR.  . Chronic low back pain 07/29/14  . Degenerative arthritis of lumbar spine 07/27/14  . Dementia (HCC)   . Depression   . Esophageal spasm 07/29/14  . GERD (gastroesophageal reflux disease)   . History of stress test    a. 04/2017 MV: low risk stress test.  EF 55-65%. Probable apical ant/apical, basal, and mid inflat attenuation artifact vs ischemia/scar.  . Hyperlipemia 07/29/14  . Hypertension   . Pneumonia   . Spinal stenosis 07/29/14    Past Surgical History:  Procedure Laterality Date  . HIP SURGERY Right   . INNER EAR SURGERY Right   . REPLACEMENT TOTAL KNEE BILATERAL Bilateral 07/27/14    Family History  Problem Relation Age of Onset  . Hypertension Mother   . Stroke Mother    Social History:  reports that she quit smoking about 43 years ago. Her smoking use included cigarettes. She has never used smokeless tobacco. She reports that she does not drink alcohol or use drugs.  Allergies:  Allergies  Allergen Reactions  . Aspirin Other (See Comments)    Unknown reaction  . Sulfa Antibiotics Other (See Comments)    Unknown reaction    (Not in a hospital admission)   No results found for this or any previous visit (from the past 48 hour(s)). No results found.  Review of Systems  Constitutional: Negative.   HENT: Negative.   Eyes: Negative.   Respiratory: Negative.   Cardiovascular: Negative.   Gastrointestinal: Negative.   Musculoskeletal: Positive for back pain.  Skin: Negative.   Neurological: Negative.   Psychiatric/Behavioral: Negative.     Blood pressure 121/70, pulse 79, temperature 97.8  F (36.6 C), temperature source Oral, resp. rate 18, SpO2 98 %. Physical Exam  Nursing note and vitals reviewed. Constitutional: She appears well-developed and well-nourished.  HENT:  Head: Normocephalic and atraumatic.  Eyes: Pupils are equal, round, and reactive to light. Conjunctivae are normal.  Cardiovascular: Normal heart sounds.  Respiratory: Effort normal.  GI: Soft.  Musculoskeletal:        General: Normal range of motion.     Cervical back: Normal range of motion.  Neurological: She is alert.  Skin: Skin is warm and dry.  Psychiatric: Judgment normal. Her affect is blunt. Her speech is delayed. She is slowed. Thought content is not paranoid. Cognition and memory are impaired. She expresses no homicidal and no suicidal ideation.     Assessment/Plan Return 3 weeks  Mordecai Rasmussen, MD 06/13/2019, 9:50 AM

## 2019-06-13 NOTE — Transfer of Care (Signed)
Immediate Anesthesia Transfer of Care Note  Patient: Jenna Dudley  Procedure(s) Performed: ECT TX  Patient Location: PACU  Anesthesia Type:General  Level of Consciousness: drowsy and patient cooperative  Airway & Oxygen Therapy: Patient Spontanous Breathing and Patient connected to face mask oxygen  Post-op Assessment: Report given to RN and Post -op Vital signs reviewed and stable  Post vital signs: Reviewed and stable  Last Vitals:  Vitals Value Taken Time  BP    Temp    Pulse    Resp    SpO2      Last Pain:  Vitals:   06/13/19 0851  TempSrc:   PainSc: 0-No pain         Complications: No apparent anesthesia complications

## 2019-06-13 NOTE — Discharge Instructions (Signed)
1)  The drugs that you have been given will stay in your system until tomorrow so for the       next 24 hours you should not:  A. Drive an automobile  B. Make any legal decisions  C. Drink any alcoholic beverages  2)  You may resume your regular meals upon return home.  3)  A responsible adult must take you home.  Someone should stay with you for a few          hours, then be available by phone for the remainder of the treatment day.  4)  You May experience any of the following symptoms:  Headache, Nausea and a dry mouth (due to the medications you were given),  temporary memory loss and some confusion, or sore muscles (a warm bath  should help this).  If you you experience any of these symptoms let us know on                your return visit.  5)  Report any of the following: any acute discomfort, severe headache, or temperature        greater than 100.5 F.   Also report any unusual redness, swelling, drainage, or pain         at your IV site.    You may report Symptoms to:  ECT PROGRAM- [place] at [facility]          Phone: [phone], ECT Department           or Dr. Shary Key office [phone]  6)  Your next ECT Treatment is Wednesday April 14  We will call 2 days prior to your scheduled appointment for arrival times.  7)  Nothing to eat or drink after midnight the night before your procedure.  8)  Take     With a sip of water the morning of your procedure.  9)  Other Instructions: Call [phone] to cancel the morning of your procedure due         to illness or emergency.  10) We will call within 72 hours to assess how you are feeling.

## 2019-06-24 ENCOUNTER — Other Ambulatory Visit: Payer: Self-pay | Admitting: Psychiatry

## 2019-06-27 ENCOUNTER — Telehealth (HOSPITAL_COMMUNITY): Payer: Self-pay

## 2019-06-27 NOTE — Telephone Encounter (Signed)
Error

## 2019-07-07 ENCOUNTER — Other Ambulatory Visit: Payer: Self-pay

## 2019-07-07 ENCOUNTER — Telehealth: Payer: Self-pay | Admitting: *Deleted

## 2019-07-07 ENCOUNTER — Other Ambulatory Visit
Admission: RE | Admit: 2019-07-07 | Discharge: 2019-07-07 | Disposition: A | Discharge status: Home / Self Care | Payer: Medicare Other | Source: Ambulatory Visit | Attending: Psychiatry | Admitting: Psychiatry

## 2019-07-07 DIAGNOSIS — Z01812 Encounter for preprocedural laboratory examination: Secondary | ICD-10-CM | POA: Diagnosis present

## 2019-07-07 DIAGNOSIS — Z20822 Contact with and (suspected) exposure to covid-19: Secondary | ICD-10-CM | POA: Insufficient documentation

## 2019-07-07 LAB — SARS CORONAVIRUS 2 (TAT 6-24 HRS): SARS Coronavirus 2: NEGATIVE

## 2019-07-08 ENCOUNTER — Other Ambulatory Visit: Payer: Self-pay | Admitting: Psychiatry

## 2019-07-09 ENCOUNTER — Encounter: Payer: Self-pay | Admitting: Anesthesiology

## 2019-07-09 ENCOUNTER — Encounter
Admission: RE | Admit: 2019-07-09 | Discharge: 2019-07-09 | Disposition: A | Discharge status: Home / Self Care | Payer: Medicare Other | Source: Ambulatory Visit | Attending: Psychiatry | Admitting: Psychiatry

## 2019-07-09 ENCOUNTER — Other Ambulatory Visit: Payer: Self-pay

## 2019-07-09 DIAGNOSIS — Z882 Allergy status to sulfonamides status: Secondary | ICD-10-CM | POA: Diagnosis not present

## 2019-07-09 DIAGNOSIS — F332 Major depressive disorder, 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 features: Secondary | ICD-10-CM

## 2019-07-09 DIAGNOSIS — Z87891 Personal history of nicotine dependence: Secondary | ICD-10-CM | POA: Insufficient documentation

## 2019-07-09 DIAGNOSIS — K219 Gastro-esophageal reflux disease without esophagitis: Secondary | ICD-10-CM | POA: Diagnosis not present

## 2019-07-09 DIAGNOSIS — I110 Hypertensive heart disease with heart failure: Secondary | ICD-10-CM | POA: Insufficient documentation

## 2019-07-09 DIAGNOSIS — F0390 Unspecified dementia without behavioral disturbance: Secondary | ICD-10-CM | POA: Insufficient documentation

## 2019-07-09 DIAGNOSIS — I5032 Chronic diastolic (congestive) heart failure: Secondary | ICD-10-CM | POA: Diagnosis not present

## 2019-07-09 DIAGNOSIS — F319 Bipolar disorder, unspecified: Secondary | ICD-10-CM | POA: Diagnosis not present

## 2019-07-09 MED ORDER — METHOHEXITAL SODIUM 100 MG/10ML IV SOSY
PREFILLED_SYRINGE | INTRAVENOUS | Status: DC | PRN
  Administered 2019-07-09: 60 mg via INTRAVENOUS

## 2019-07-09 MED ORDER — LABETALOL HCL 5 MG/ML IV SOLN
INTRAVENOUS | Status: DC | PRN
  Administered 2019-07-09: 20 mg via INTRAVENOUS

## 2019-07-09 MED ORDER — SODIUM CHLORIDE 0.9 % IV SOLN
500.0000 mL | Freq: Once | INTRAVENOUS | Status: AC
  Administered 2019-07-09: 500 mL via INTRAVENOUS

## 2019-07-09 MED ORDER — LABETALOL HCL 5 MG/ML IV SOLN
INTRAVENOUS | Status: AC
  Filled 2019-07-09: qty 4

## 2019-07-09 MED ORDER — SUCCINYLCHOLINE CHLORIDE 20 MG/ML IJ SOLN
INTRAMUSCULAR | Status: DC | PRN
  Administered 2019-07-09: 80 mg via INTRAVENOUS

## 2019-07-09 MED ORDER — SUCCINYLCHOLINE CHLORIDE 200 MG/10ML IV SOSY
PREFILLED_SYRINGE | INTRAVENOUS | Status: AC
  Filled 2019-07-09: qty 10

## 2019-07-09 NOTE — Anesthesia Procedure Notes (Signed)
Date/Time: 07/09/2019 11:00 AM Performed by: Ginger Carne, CRNA Pre-anesthesia Checklist: Patient identified, Emergency Drugs available, Suction available, Patient being monitored and Timeout performed Patient Re-evaluated:Patient Re-evaluated prior to induction Oxygen Delivery Method: Circle system utilized Preoxygenation: Pre-oxygenation with 100% oxygen Induction Type: IV induction Ventilation: Mask ventilation throughout procedure

## 2019-07-09 NOTE — H&P (Signed)
Jenna Dudley is an 84 y.o. female.   Chief Complaint: no complaint. Doing well HPI: recurrent psychotic depression  Past Medical History:  Diagnosis Date  . Bilateral carpal tunnel syndrome 07/27/14  . Bipolar disorder [facility])    geropsych admission to [facility] in Dec 2018  . Chronic diastolic CHF (congestive heart failure) ([place])    a. 03/2017 Echo: EF 55-60%, no rwma, Gr1 DD, mild MR.  . Chronic low back pain 07/29/14  . Degenerative arthritis of lumbar spine 07/27/14  . Dementia ([place])   . Depression   . Esophageal spasm 07/29/14  . GERD (gastroesophageal reflux disease)   . History of stress test    a. 04/2017 MV: low risk stress test.  EF 55-65%. Probable apical ant/apical, basal, and mid inflat attenuation artifact vs ischemia/scar.  . Hyperlipemia 07/29/14  . Hypertension   . Pneumonia   . Spinal stenosis 07/29/14    Past Surgical History:  Procedure Laterality Date  . HIP SURGERY Right   . INNER EAR SURGERY Right   . REPLACEMENT TOTAL KNEE BILATERAL Bilateral 07/27/14    Family History  Problem Relation Age of Onset  . Hypertension Mother   . Stroke Mother    Social History:  reports that she quit smoking about 43 years ago. Her smoking use included cigarettes. She has never used smokeless tobacco. She reports that she does not drink alcohol or use drugs.  Allergies:  Allergies  Allergen Reactions  . Aspirin Other (See Comments)    Unknown reaction  . Sulfa Antibiotics Other (See Comments)    Unknown reaction    (Not in a hospital admission)   Results for orders placed or performed during the hospital encounter of 07/07/19 (from the past 48 hour(s))  SARS CORONAVIRUS 2 (TAT 6-24 HRS) Nasopharyngeal Nasopharyngeal Swab     Status: None   Collection Time: 07/07/19 12:43 PM   Specimen: Nasopharyngeal Swab  Result Value Ref Range   SARS Coronavirus 2 NEGATIVE NEGATIVE    Comment: (NOTE) SARS-CoV-2 target nucleic acids are NOT DETECTED. The SARS-CoV-2 RNA is generally  detectable in upper and lower respiratory specimens during the acute phase of infection. Negative results do not preclude SARS-CoV-2 infection, do not rule out co-infections with other pathogens, and should not be used as the sole basis for treatment or other patient management decisions. Negative results must be combined with clinical observations, patient history, and epidemiological information. The expected result is Negative. Fact Sheet for Patients: SugarRoll.be Fact Sheet for Healthcare Providers: https://www.woods-mathews.com/ This test is not yet approved or cleared by the [place] FDA and  has been authorized for detection and/or diagnosis of SARS-CoV-2 by FDA under an Emergency Use Authorization (EUA). This EUA will remain  in effect (meaning this test can be used) for the duration of the COVID-19 declaration under Section 56 4(b)(1) of the Act, 21 U.S.C. section 360bbb-3(b)(1), unless the authorization is terminated or revoked sooner. Performed at [facility], [place] [address], [place], [place] [address]    No results found.  Review of Systems  Constitutional: Negative.   HENT: Negative.   Eyes: Negative.   Respiratory: Negative.   Cardiovascular: Negative.   Gastrointestinal: Negative.   Musculoskeletal: Negative.   Skin: Negative.   Neurological: Negative.   Psychiatric/Behavioral: Negative.     Blood pressure 114/70, pulse 75, temperature 98.5 F (36.9 C), temperature source Oral, resp. rate 20, height 5' (1.524 m), weight 76.7 kg, SpO2 93 %. Physical Exam  Nursing note and  vitals reviewed. Constitutional: She appears well-developed and well-nourished.  HENT:  Head: Normocephalic and atraumatic.  Eyes: Pupils are equal, round, and reactive to light. Conjunctivae are normal.  Cardiovascular: Regular rhythm and normal heart sounds.  Respiratory: Effort normal.  GI: Soft.  Musculoskeletal:        General:  Normal range of motion.     Cervical back: Normal range of motion.  Neurological: She is alert.  Skin: Skin is warm and dry.  Psychiatric: She has a normal mood and affect. Her behavior is normal. Judgment and thought content normal.     Assessment/Plan Follow up in 3 weeks. Trying to balance minimizing side effects with having good effect  Mordecai Rasmussen, MD 07/09/2019, 9:43 AM

## 2019-07-09 NOTE — Procedures (Signed)
ECT SERVICES Physician's Interval Evaluation & Treatment Note  Patient Identification: Jenna Dudley MRN:  [id number] Date of Evaluation:  07/09/2019 TX #: 37  MADRS:   MMSE:   P.E. Findings:  Physical exam for her no change  Psychiatric Interval Note:  Mood is good no sign of worsening depression  Subjective:  Patient is a 84 y.o. female seen for evaluation for Electroconvulsive Therapy. No complaints  Treatment Summary:   [x]   Right Unilateral             []  Bilateral   % Energy : 0.3 ms 90%   Impedance: 2120 ohms  Seizure Energy Index: 3543 V squared  Postictal Suppression Index: 61%  Seizure Concordance Index: 88%  Medications  Pre Shock: Labetalol 20 mg Toradol 30 mg Brevital 60 mg succinylcholine 80 mg  Post Shock: None  Seizure Duration: 24 seconds EMG 60 seconds EEG   Comments: Follow-up in about a month  Lungs:  [x]   Clear to auscultation               []  Other:   Heart:    [x]   Regular rhythm             []  irregular rhythm    [x]   Previous H&P reviewed, patient examined and there are NO CHANGES                 []   Previous H&P reviewed, patient examined and there are changes noted.   , MD 4/14/202111:13 AM

## 2019-07-09 NOTE — Anesthesia Preprocedure Evaluation (Signed)
Anesthesia Evaluation  Patient identified by MRN, date of birth, ID band Patient awake    Reviewed: Allergy & Precautions, NPO status , Patient's Chart, lab work & pertinent test results  History of Anesthesia Complications Negative for: history of anesthetic complications  Airway Mallampati: II  TM Distance: >3 FB Neck ROM: Full    Dental  (+) Edentulous Upper, Poor Dentition   Pulmonary neg sleep apnea, neg COPD, former smoker,    breath sounds clear to auscultation- rhonchi (-) wheezing      Cardiovascular hypertension, Pt. on medications +CHF  (-) CAD, (-) Past MI, (-) Cardiac Stents and (-) CABG  Rhythm:Regular Rate:Normal - Systolic murmurs and - Diastolic murmurs Echo 04/22/17: - Left ventricle: The cavity size was normal. There was mild   concentric hypertrophy. Systolic function was normal. The   estimated ejection fraction was in the range of 55% to 60%. Wall   motion was normal; there were no regional wall motion   abnormalities. Doppler parameters are consistent with abnormal   left ventricular relaxation (grade 1 diastolic dysfunction). - Mitral valve: Calcified annulus. There was mild regurgitation.   Neuro/Psych PSYCHIATRIC DISORDERS Depression Bipolar Disorder Dementia negative neurological ROS     GI/Hepatic Neg liver ROS, GERD  ,  Endo/Other  negative endocrine ROSneg diabetes  Renal/GU negative Renal ROS     Musculoskeletal  (+) Arthritis ,   Abdominal (+) - obese,   Peds  Hematology negative hematology ROS (+)   Anesthesia Other Findings Past Medical History: 07/27/14: Bilateral carpal tunnel syndrome No date: Bipolar disorder [facility])     Comment:  geropsych admission to [facility] in Dec 2018 No date: Chronic diastolic CHF (congestive heart failure) (HCC)     Comment:  a. 03/2017 Echo: EF 55-60%, no rwma, Gr1 DD, mild MR. 07/29/14: Chronic low back pain 07/27/14: Degenerative arthritis of lumbar  spine No date: Dementia (HCC) No date: Depression 07/29/14: Esophageal spasm No date: GERD (gastroesophageal reflux disease) 07/29/14: Hyperlipemia No date: Hypertension No date: Pneumonia 07/29/14: Spinal stenosis   Reproductive/Obstetrics                             Anesthesia Physical  Anesthesia Plan  ASA: III  Anesthesia Plan: General   Post-op Pain Management:    Induction: Intravenous  PONV Risk Score and Plan: 2 and Ondansetron  Airway Management Planned: Mask  Additional Equipment:   Intra-op Plan:   Post-operative Plan:   Informed Consent: I have reviewed the patients History and Physical, chart, labs and discussed the procedure including the risks, benefits and alternatives for the proposed anesthesia with the patient or authorized representative who has indicated his/her understanding and acceptance.     Dental advisory given  Plan Discussed with: CRNA and Anesthesiologist  Anesthesia Plan Comments:         Anesthesia Quick Evaluation

## 2019-07-09 NOTE — Transfer of Care (Signed)
Immediate Anesthesia Transfer of Care Note  Patient: Jenna Dudley  Procedure(s) Performed: ECT TX  Patient Location: PACU  Anesthesia Type:General  Level of Consciousness: sedated  Airway & Oxygen Therapy: Patient Spontanous Breathing and Patient connected to face mask oxygen  Post-op Assessment: Report given to RN and Post -op Vital signs reviewed and stable  Post vital signs: Reviewed and stable  Last Vitals:  Vitals Value Taken Time  BP 137/61 07/09/19 1122  Temp    Pulse 71 07/09/19 1122  Resp 23 07/09/19 1122  SpO2 100 % 07/09/19 1122    Last Pain:  Vitals:   07/09/19 1121  TempSrc:   PainSc: (P) Asleep         Complications: No apparent anesthesia complications

## 2019-07-09 NOTE — Discharge Instructions (Signed)
1)  The drugs that you have been given will stay in your system until tomorrow so for the       next 24 hours you should not:  A. Drive an automobile  B. Make any legal decisions  C. Drink any alcoholic beverages  2)  You may resume your regular meals upon return home.  3)  A responsible adult must take you home.  Someone should stay with you for a few          hours, then be available by phone for the remainder of the treatment day.  4)  You May experience any of the following symptoms:  Headache, Nausea and a dry mouth (due to the medications you were given),  temporary memory loss and some confusion, or sore muscles (a warm bath  should help this).  If you you experience any of these symptoms let us know on                your return visit.  5)  Report any of the following: any acute discomfort, severe headache, or temperature        greater than 100.5 F.   Also report any unusual redness, swelling, drainage, or pain         at your IV site.    You may report Symptoms to:  ECT PROGRAM- [place] at [facility]          Phone: [phone], ECT Department           or Dr. Shary Key office [phone]  6)  Your next ECT Treatment is Wednesday May 19 at 8:30   We will call 2 days prior to your scheduled appointment for arrival times.  7)  Nothing to eat or drink after midnight the night before your procedure.  8)  Take     With a sip of water the morning of your procedure.  9)  Other Instructions: Call [phone] to cancel the morning of your procedure due         to illness or emergency.  10) We will call within 72 hours to assess how you are feeling.

## 2019-07-10 ENCOUNTER — Ambulatory Visit (INDEPENDENT_AMBULATORY_CARE_PROVIDER_SITE_OTHER): Payer: Medicare Other

## 2019-07-10 DIAGNOSIS — R0600 Dyspnea, unspecified: Secondary | ICD-10-CM | POA: Diagnosis not present

## 2019-07-10 NOTE — Anesthesia Postprocedure Evaluation (Signed)
Anesthesia Post Note  Patient: Jenna Dudley  Procedure(s) Performed: ECT TX  Patient location during evaluation: PACU Anesthesia Type: General Level of consciousness: awake and alert Pain management: pain level controlled Vital Signs Assessment: post-procedure vital signs reviewed and stable Respiratory status: spontaneous breathing, nonlabored ventilation, respiratory function stable and patient connected to nasal cannula oxygen Cardiovascular status: blood pressure returned to baseline and stable Postop Assessment: no apparent nausea or vomiting Anesthetic complications: no     Last Vitals:  Vitals:   07/09/19 1143 07/09/19 1145  BP: 125/80   Pulse: 87 85  Resp: 19 (!) 31  Temp: (!) 36.2 C   SpO2:  93%    Last Pain:  Vitals:   07/09/19 1143  TempSrc:   PainSc: 0-No pain                 Lenard Simmer

## 2019-07-14 ENCOUNTER — Telehealth: Payer: Self-pay

## 2019-07-14 NOTE — Telephone Encounter (Signed)
Call to patient to review labs.    Husband verbalized understanding and has no further questions at this time.    Advised pt to call for any further questions or concerns.  No further orders.

## 2019-07-14 NOTE — Telephone Encounter (Signed)
-----   Message from Sondra Barges, PA-C sent at 07/13/2019  9:47 AM EDT ----- Echo showed normal pump function, normal wall motion, moderate thickening of the heart, slightly stiffened heart, moderately elevated pressure in the right side of the heart, and a mildly leaky mitral valve.   Recommendations: -Optimal BP control (which was well controlled at her recent visit) -Optimal HR control (which was mildly elevated at her last visit, likely in the setting of being off metoprolol) -She was restarted on both Lasix and metoprolol at her last visit, which should help with the above -Sometimes we can see elevated pressure in the right side of the heart with prior smoking or sleep apnea. I am not certain she would tolerate sleep study with possible CPAP given her dementia and underlying psychiatric diagnoses. This can be discussed with her and her husband in follow up

## 2019-08-01 ENCOUNTER — Telehealth: Payer: Self-pay

## 2019-08-11 ENCOUNTER — Other Ambulatory Visit: Payer: Self-pay

## 2019-08-11 ENCOUNTER — Other Ambulatory Visit
Admission: RE | Admit: 2019-08-11 | Discharge: 2019-08-11 | Disposition: A | Discharge status: Home / Self Care | Payer: Medicare Other | Source: Ambulatory Visit | Attending: Psychiatry | Admitting: Psychiatry

## 2019-08-11 DIAGNOSIS — Z20822 Contact with and (suspected) exposure to covid-19: Secondary | ICD-10-CM | POA: Diagnosis not present

## 2019-08-11 DIAGNOSIS — Z01812 Encounter for preprocedural laboratory examination: Secondary | ICD-10-CM | POA: Diagnosis present

## 2019-08-11 LAB — SARS CORONAVIRUS 2 (TAT 6-24 HRS): SARS Coronavirus 2: NEGATIVE

## 2019-08-12 ENCOUNTER — Other Ambulatory Visit: Payer: Self-pay | Admitting: Psychiatry

## 2019-08-13 ENCOUNTER — Other Ambulatory Visit: Payer: Self-pay

## 2019-08-13 ENCOUNTER — Encounter: Payer: Self-pay | Admitting: Anesthesiology

## 2019-08-13 ENCOUNTER — Encounter
Admission: RE | Admit: 2019-08-13 | Discharge: 2019-08-13 | Disposition: A | Discharge status: Home / Self Care | Payer: Medicare Other | Source: Ambulatory Visit | Attending: Psychiatry | Admitting: Psychiatry

## 2019-08-13 DIAGNOSIS — F323 Major depressive disorder, single episode, severe with psychotic features: Secondary | ICD-10-CM | POA: Diagnosis not present

## 2019-08-13 DIAGNOSIS — Z96653 Presence of artificial knee joint, bilateral: Secondary | ICD-10-CM | POA: Insufficient documentation

## 2019-08-13 DIAGNOSIS — Z886 Allergy status to analgesic agent status: Secondary | ICD-10-CM | POA: Diagnosis not present

## 2019-08-13 DIAGNOSIS — I5032 Chronic diastolic (congestive) heart failure: Secondary | ICD-10-CM | POA: Diagnosis not present

## 2019-08-13 DIAGNOSIS — Z87891 Personal history of nicotine dependence: Secondary | ICD-10-CM | POA: Insufficient documentation

## 2019-08-13 DIAGNOSIS — Z8249 Family history of ischemic heart disease and other diseases of the circulatory system: Secondary | ICD-10-CM | POA: Insufficient documentation

## 2019-08-13 DIAGNOSIS — Z79899 Other long term (current) drug therapy: Secondary | ICD-10-CM | POA: Insufficient documentation

## 2019-08-13 DIAGNOSIS — F333 Major depressive disorder, recurrent, severe with psychotic symptoms: Secondary | ICD-10-CM | POA: Insufficient documentation

## 2019-08-13 DIAGNOSIS — F0390 Unspecified dementia without behavioral disturbance: Secondary | ICD-10-CM | POA: Diagnosis not present

## 2019-08-13 DIAGNOSIS — K219 Gastro-esophageal reflux disease without esophagitis: Secondary | ICD-10-CM | POA: Diagnosis not present

## 2019-08-13 DIAGNOSIS — E785 Hyperlipidemia, unspecified: Secondary | ICD-10-CM | POA: Insufficient documentation

## 2019-08-13 DIAGNOSIS — I110 Hypertensive heart disease with heart failure: Secondary | ICD-10-CM | POA: Diagnosis not present

## 2019-08-13 DIAGNOSIS — Z823 Family history of stroke: Secondary | ICD-10-CM | POA: Insufficient documentation

## 2019-08-13 DIAGNOSIS — Z882 Allergy status to sulfonamides status: Secondary | ICD-10-CM | POA: Diagnosis not present

## 2019-08-13 MED ORDER — SUCCINYLCHOLINE CHLORIDE 200 MG/10ML IV SOSY
PREFILLED_SYRINGE | INTRAVENOUS | Status: AC
  Filled 2019-08-13: qty 10

## 2019-08-13 MED ORDER — LABETALOL HCL 5 MG/ML IV SOLN
INTRAVENOUS | Status: AC
  Filled 2019-08-13: qty 4

## 2019-08-13 MED ORDER — LABETALOL HCL 5 MG/ML IV SOLN
INTRAVENOUS | Status: DC | PRN
  Administered 2019-08-13: 20 mg via INTRAVENOUS

## 2019-08-13 MED ORDER — METHOHEXITAL SODIUM 100 MG/10ML IV SOSY
PREFILLED_SYRINGE | INTRAVENOUS | Status: DC | PRN
  Administered 2019-08-13: 60 mg via INTRAVENOUS

## 2019-08-13 MED ORDER — KETOROLAC TROMETHAMINE 30 MG/ML IJ SOLN
INTRAMUSCULAR | Status: AC
  Filled 2019-08-13: qty 1

## 2019-08-13 MED ORDER — SUCCINYLCHOLINE CHLORIDE 200 MG/10ML IV SOSY
PREFILLED_SYRINGE | INTRAVENOUS | Status: DC | PRN
  Administered 2019-08-13: 80 mg via INTRAVENOUS

## 2019-08-13 MED ORDER — KETOROLAC TROMETHAMINE 30 MG/ML IJ SOLN
30.0000 mg | Freq: Once | INTRAMUSCULAR | Status: AC
  Administered 2019-08-13: 30 mg via INTRAVENOUS

## 2019-08-13 MED ORDER — SODIUM CHLORIDE 0.9 % IV SOLN
500.0000 mL | Freq: Once | INTRAVENOUS | Status: AC
  Administered 2019-08-13: 500 mL via INTRAVENOUS

## 2019-08-13 MED ORDER — SODIUM CHLORIDE 0.9 % IV SOLN
INTRAVENOUS | Status: DC | PRN
Start: 2019-08-13 — End: 2019-08-13

## 2019-08-13 NOTE — Discharge Instructions (Signed)
1)  The drugs that you have been given will stay in your system until tomorrow so for the       next 24 hours you should not:  A. Drive an automobile  B. Make any legal decisions  C. Drink any alcoholic beverages  2)  You may resume your regular meals upon return home.  3)  A responsible adult must take you home.  Someone should stay with you for a few          hours, then be available by phone for the remainder of the treatment day.  4)  You May experience any of the following symptoms:  Headache, Nausea and a dry mouth (due to the medications you were given),  temporary memory loss and some confusion, or sore muscles (a warm bath  should help this).  If you you experience any of these symptoms let us know on                your return visit.  5)  Report any of the following: any acute discomfort, severe headache, or temperature        greater than 100.5 F.   Also report any unusual redness, swelling, drainage, or pain         at your IV site.    You may report Symptoms to:  ECT PROGRAM- [place] [facility]          Phone: [phone], ECT Department           or Dr. Shary Key office [phone]  6)  Your next ECT Treatment is Wednesday June 2 at 8:30   We will call 2 days prior to your scheduled appointment for arrival times.  7)  Nothing to eat or drink after midnight the night before your procedure.  8)  Take   With a sip of water the morning of your procedure.  9)  Other Instructions: Call [phone] to cancel the morning of your procedure due         to illness or emergency.  10) We will call within 72 hours to assess how you are feeling.

## 2019-08-13 NOTE — Progress Notes (Signed)
O2 sats 86% on RA up to 92% on 2LNC, No O2 set up at home.  Dr. Orland Penman aware.  Will work with patient with IS.  As long as O2 sats maintain in low 90's on RA, okay to discharge patient home.  Patients husband updated to all.

## 2019-08-13 NOTE — Anesthesia Procedure Notes (Addendum)
Procedure Name: General with mask airway Date/Time: 08/13/2019 10:20 AM Performed by: Irving Burton, CRNA Pre-anesthesia Checklist: Patient identified, Emergency Drugs available, Suction available, Patient being monitored and Timeout performed Patient Re-evaluated:Patient Re-evaluated prior to induction Oxygen Delivery Method: Circle system utilized Preoxygenation: Pre-oxygenation with 100% oxygen Induction Type: IV induction Ventilation: Mask ventilation without difficulty Airway Equipment and Method: Bite block Placement Confirmation: positive ETCO2 Dental Injury: Teeth and Oropharynx as per pre-operative assessment

## 2019-08-13 NOTE — Transfer of Care (Signed)
Immediate Anesthesia Transfer of Care Note  Patient: Jenna Dudley  Procedure(s) Performed: ECT TX  Patient Location: PACU  Anesthesia Type:General  Level of Consciousness: sedated  Airway & Oxygen Therapy: Patient connected to face mask oxygen  Post-op Assessment: Post -op Vital signs reviewed and stable  Post vital signs: stable  Last Vitals:  Vitals Value Taken Time  BP    Temp    Pulse    Resp    SpO2      Last Pain:  Vitals:   08/13/19 0848  TempSrc:   PainSc: 0-No pain         Complications: No apparent anesthesia complications

## 2019-08-13 NOTE — Anesthesia Preprocedure Evaluation (Addendum)
Anesthesia Evaluation  Patient identified by MRN, date of birth, ID band Patient awake    Reviewed: Allergy & Precautions, NPO status , Patient's Chart, lab work & pertinent test results  History of Anesthesia Complications Negative for: history of anesthetic complications  Airway Mallampati: II  TM Distance: >3 FB Neck ROM: Full    Dental  (+) Edentulous Upper, Poor Dentition   Pulmonary neg sleep apnea, neg COPD, former smoker,    breath sounds clear to auscultation- rhonchi (-) wheezing      Cardiovascular hypertension, Pt. on medications +CHF  (-) CAD, (-) Past MI, (-) Cardiac Stents and (-) CABG  Rhythm:Regular Rate:Normal - Systolic murmurs and - Diastolic murmurs Echo 04/22/17: - Left ventricle: The cavity size was normal. There was mild   concentric hypertrophy. Systolic function was normal. The   estimated ejection fraction was in the range of 55% to 60%. Wall   motion was normal; there were no regional wall motion   abnormalities. Doppler parameters are consistent with abnormal   left ventricular relaxation (grade 1 diastolic dysfunction). - Mitral valve: Calcified annulus. There was mild regurgitation.   Neuro/Psych PSYCHIATRIC DISORDERS Anxiety Depression Bipolar Disorder Dementia negative neurological ROS     GI/Hepatic Neg liver ROS, GERD  ,  Endo/Other  negative endocrine ROSneg diabetes  Renal/GU negative Renal ROS     Musculoskeletal  (+) Arthritis ,   Abdominal (+) - obese,   Peds  Hematology negative hematology ROS (+)   Anesthesia Other Findings Past Medical History: 07/27/14: Bilateral carpal tunnel syndrome No date: Bipolar disorder [facility])     Comment:  geropsych admission to [facility] in Dec 2018 No date: Chronic diastolic CHF (congestive heart failure) (HCC)     Comment:  a. 03/2017 Echo: EF 55-60%, no rwma, Gr1 DD, mild MR. 07/29/14: Chronic low back pain 07/27/14: Degenerative arthritis of  lumbar spine No date: Dementia (HCC) No date: Depression 07/29/14: Esophageal spasm No date: GERD (gastroesophageal reflux disease) 07/29/14: Hyperlipemia No date: Hypertension No date: Pneumonia 07/29/14: Spinal stenosis   Reproductive/Obstetrics                            Anesthesia Physical  Anesthesia Plan  ASA: III  Anesthesia Plan: General   Post-op Pain Management:    Induction: Intravenous  PONV Risk Score and Plan: 2 and Ondansetron  Airway Management Planned: Mask  Additional Equipment:   Intra-op Plan:   Post-operative Plan:   Informed Consent: I have reviewed the patients History and Physical, chart, labs and discussed the procedure including the risks, benefits and alternatives for the proposed anesthesia with the patient or authorized representative who has indicated his/her understanding and acceptance.     Dental advisory given  Plan Discussed with: CRNA and Anesthesiologist  Anesthesia Plan Comments:         Anesthesia Quick Evaluation

## 2019-08-13 NOTE — Procedures (Signed)
ECT SERVICES Physician's Interval Evaluation & Treatment Note  Patient Identification: Jenna Dudley MRN:  [id number] Date of Evaluation:  08/13/2019 TX #: [id number]  MADRS:   MMSE:   P.E. Findings:  No specific change to physical exam  Psychiatric Interval Note:  Patient is decompensated with anxious depression recurrent depressive statements  Subjective:  Patient is a 84 y.o. female seen for evaluation for Electroconvulsive Therapy. Thinks she has done something wrong  Treatment Summary:   [x]   Right Unilateral             []  Bilateral   % Energy : 0.3 ms 90%   Impedance: 1900 ohms  Seizure Energy Index: 33,448 V squared  Postictal Suppression Index:    Seizure Concordance Index: 67%  Medications  Pre Shock: Labetalol 20 mg Toradol 30 mg Brevital 60 mg succinylcholine 80 mg  Post Shock:    Seizure Duration: 16 seconds EMG 23 seconds EEG   Comments: She appeared much calmer after treatment although she had some desaturation and took some time before she came back to breathing normally.  I recommend return in 2 weeks based on her history but we will talk with the husband and the patient about the benefits of continued treatment  Lungs:  [x]   Clear to auscultation               []  Other:   Heart:    [x]   Regular rhythm             []  irregular rhythm    [x]   Previous H&P reviewed, patient examined and there are NO CHANGES                 []   Previous H&P reviewed, patient examined and there are changes noted.   , MD 5/19/20214:05 PM

## 2019-08-13 NOTE — H&P (Signed)
Jenna Dudley is an 84 y.o. female.   Chief Complaint: She appears to be in one of her depressed phases today.  She was anxious and constantly asking whether she had done anything wrong. HPI: Recurrent psychotic depression  Past Medical History:  Diagnosis Date  . Bilateral carpal tunnel syndrome 07/27/14  . Bipolar disorder [facility])    geropsych admission to [facility] in Dec 2018  . Chronic diastolic CHF (congestive heart failure) (HCC)    a. 03/2017 Echo: EF 55-60%, no rwma, Gr1 DD, mild MR.  . Chronic low back pain 07/29/14  . Degenerative arthritis of lumbar spine 07/27/14  . Dementia (HCC)   . Depression   . Esophageal spasm 07/29/14  . GERD (gastroesophageal reflux disease)   . History of stress test    a. 04/2017 MV: low risk stress test.  EF 55-65%. Probable apical ant/apical, basal, and mid inflat attenuation artifact vs ischemia/scar.  . Hyperlipemia 07/29/14  . Hypertension   . Pneumonia   . Spinal stenosis 07/29/14    Past Surgical History:  Procedure Laterality Date  . HIP SURGERY Right   . INNER EAR SURGERY Right   . REPLACEMENT TOTAL KNEE BILATERAL Bilateral 07/27/14    Family History  Problem Relation Age of Onset  . Hypertension Mother   . Stroke Mother    Social History:  reports that she quit smoking about 44 years ago. Her smoking use included cigarettes. She has never used smokeless tobacco. She reports that she does not drink alcohol or use drugs.  Allergies:  Allergies  Allergen Reactions  . Aspirin Other (See Comments)    Unknown reaction  . Sulfa Antibiotics Other (See Comments)    Unknown reaction    (Not in a hospital admission)   No results found for this or any previous visit (from the past 48 hour(s)). No results found.  Review of Systems  Constitutional: Negative.   HENT: Negative.   Eyes: Negative.   Respiratory: Negative.   Cardiovascular: Negative.   Gastrointestinal: Negative.   Musculoskeletal: Negative.   Skin: Negative.    Neurological: Negative.   Psychiatric/Behavioral: Positive for agitation, confusion and dysphoric mood. The patient is nervous/anxious.     Blood pressure 104/64, pulse 81, temperature 97.8 F (36.6 C), temperature source Oral, resp. rate (!) 26, height 5' (1.524 m), weight 70.3 kg, SpO2 92 %. Physical Exam  Nursing note and vitals reviewed. Constitutional: She appears well-developed and well-nourished.  HENT:  Head: Normocephalic and atraumatic.  Eyes: Pupils are equal, round, and reactive to light. Conjunctivae are normal.  Cardiovascular: Regular rhythm and normal heart sounds.  Respiratory: Effort normal.  GI: Soft.  Musculoskeletal:        General: Normal range of motion.     Cervical back: Normal range of motion.  Neurological: She is alert.  Skin: Skin is warm and dry.  Psychiatric: Her mood appears anxious. Her affect is blunt. Her speech is tangential. She is agitated. She is not aggressive. Thought content is paranoid. Cognition and memory are impaired. She expresses inappropriate judgment. She exhibits a depressed mood. She expresses no homicidal and no suicidal ideation.     Assessment/Plan Patient appears to be decompensated compared to last month.  ECT today recommended return 2 weeks  Mordecai Rasmussen, MD 08/13/2019, 4:04 PM

## 2019-08-14 NOTE — Anesthesia Postprocedure Evaluation (Signed)
Anesthesia Post Note  Patient: Jenna Dudley  Procedure(s) Performed: ECT TX  Patient location during evaluation: PACU Anesthesia Type: General Level of consciousness: awake and alert Pain management: pain level controlled Vital Signs Assessment: post-procedure vital signs reviewed and stable Respiratory status: spontaneous breathing, nonlabored ventilation and respiratory function stable Cardiovascular status: blood pressure returned to baseline and stable Postop Assessment: no apparent nausea or vomiting Anesthetic complications: no     Last Vitals:  Vitals:   08/13/19 1121 08/13/19 1148  BP: 104/64 104/64  Pulse: 81 81  Resp: (!) 26 (!) 26  Temp:  36.6 C  SpO2: 92%     Last Pain:  Vitals:   08/13/19 1148  TempSrc: Oral  PainSc: 0-No pain                 Karleen Hampshire

## 2019-08-21 ENCOUNTER — Other Ambulatory Visit: Payer: Self-pay | Admitting: Psychiatry

## 2019-08-26 ENCOUNTER — Other Ambulatory Visit: Payer: Self-pay | Admitting: Physician Assistant

## 2019-08-27 NOTE — Progress Notes (Signed)
Cardiology Office Note    Date:  09/01/2019   ID:  Jenna Dudley, DOB September 07, 1935, MRN [id number]  PCP:  Aderoju, Jadene Pierini, MD  Cardiologist:  Kathlyn Sacramento, MD  Electrophysiologist:  None   Chief Complaint: Follow up  History of Present Illness:   Jenna Dudley is a 84 y.o. female with history of HFpEF, pulmonary hypertension, bipolar disorder, dementia, HTN, HLD, GERD, and chronic low back pain who presents for follow-up of her diastolic CHF.  She was admitted to the hospital in early 2019 with a 1 week history of dyspnea and volume overload.  Echo showed a normal LV systolic function with grade 1 diastolic dysfunction.  She was noted to have a mildly elevated troponin with a peak of 0.06.  She was not felt to be a good candidate for ischemic evaluation secondary to dementia.  Follow-up outpatient stress testing was a low risk.  She was seen virtually in 06/2018 and was doing well with stable weight at home.  She was last seen in the office in 05/2019, and was doing well from a cardiac perspective.  Her husband did note some SOB and reported her weight was up about 5 pounds when compared to her baseline.  He attributed these to the patient's sedentary lifestyle in the setting of the covid pandemic.  It was noted she had been out of Lasix and Lopressor for a little over 1 month.  These were restarted at that time.  Subsequent echo in 06/2019, showed an EF of 55-60%, no RWMA, Gr2DD, RVSF normal with normal RV cavity size, moderately elevated PASP at 54.6 mmHg, mildly dilated left atrium, mild MR/TR.  She comes in today accompanied by her husband and is doing reasonably well from a cardiac perspective.  She continues to note stable exertional dyspnea that is mostly unchanged when compared to her last visit.  No chest pain, palpitations, dizziness, presyncope, or syncope.  No lower extremity swelling, abdominal distention, orthopnea, PND, early satiety.  Her weight is down 5 pounds today when  compared to her last clinic visit following resumption of Lasix.  Blood pressure has been well controlled.  She continues to follow a heart healthy diet and is drinking less than 2 L of fluid per day.  She has not smoked in 40 years.  Her husband denies any snoring or apneic episodes.  No prior sleep study.  She has discontinued ECT therapy with psychiatry.  She continues to note chronic low back pain with bilateral lower extremity radiculopathy.   Labs independently reviewed: 12/2018 - Hgb 11.2, PLT 220 08/2018 - potassium 4.3, BUN 22, serum creatinine 1.0, A1c 5.6, TC 141, TG 77, HDL 57, LDL 69 11/2017 - AST 155, ALT 174, albumin 3.5 06/2017 - TSH normal  Past Medical History:  Diagnosis Date  . Bilateral carpal tunnel syndrome 07/27/14  . Bipolar disorder [facility])    geropsych admission to [facility] in Dec 2018  . Chronic diastolic CHF (congestive heart failure) ([place])    a. 03/2017 Echo: EF 55-60%, no rwma, Gr1 DD, mild MR.  . Chronic low back pain 07/29/14  . Degenerative arthritis of lumbar spine 07/27/14  . Dementia ([place])   . Depression   . Esophageal spasm 07/29/14  . GERD (gastroesophageal reflux disease)   . History of stress test    a. 04/2017 MV: low risk stress test.  EF 55-65%. Probable apical ant/apical, basal, and mid inflat attenuation artifact vs ischemia/scar.  . Hyperlipemia 07/29/14  . Hypertension   .  Pneumonia   . Spinal stenosis 07/29/14    Past Surgical History:  Procedure Laterality Date  . HIP SURGERY Right   . INNER EAR SURGERY Right   . REPLACEMENT TOTAL KNEE BILATERAL Bilateral 07/27/14    Current Medications: Current Meds  Medication Sig  . acetaminophen-codeine (TYLENOL #3) 300-30 MG per tablet Take 1 tablet by mouth every 8 (eight) hours as needed for moderate pain or severe pain.  . cholecalciferol (VITAMIN D) 1000 units tablet Take 1,000 Units by mouth daily.  . ferrous sulfate 325 (65 FE) MG EC tablet Take 325 mg by mouth daily with breakfast.  . furosemide  (LASIX) 20 MG tablet TAKE 1 TABLET BY MOUTH ONCE DAILY  . losartan (COZAAR) 25 MG tablet Take 1 tablet (25 mg total) by mouth daily.  . magnesium oxide (MAG-OX) 400 MG tablet Take 400 mg by mouth daily.  . metoprolol tartrate (LOPRESSOR) 50 MG tablet Take 1 tablet (50 mg total) by mouth 2 (two) times daily.  . mirtazapine (REMERON) 45 MG tablet TAKE 1 TABLET BY MOUTH AT BEDTIME  . OLANZapine (ZYPREXA) 20 MG tablet TAKE 1 TABLET BY MOUTH AT BEDTIME  . omeprazole (PRILOSEC) 20 MG capsule Take 20 mg by mouth 2 (two) times daily before a meal.   . simvastatin (ZOCOR) 40 MG tablet Take 40 mg by mouth at bedtime.   Marland Kitchen venlafaxine XR (EFFEXOR-XR) 150 MG 24 hr capsule TAKE 1 CAPSULE BY MOUTH ONCE DAILY WITH BREAKFAST    Allergies:   Aspirin and Sulfa antibiotics   Social History   Socioeconomic History  . Marital status: Married    Spouse name: Not on file  . Number of children: Not on file  . Years of education: Not on file  . Highest education level: Not on file  Occupational History    Comment: retired  Tobacco Use  . Smoking status: Former Smoker    Types: Cigarettes    Quit date: 07/29/1975    Years since quitting: 44.1  . Smokeless tobacco: Never Used  Substance and Sexual Activity  . Alcohol use: No    Alcohol/week: 0.0 standard drinks  . Drug use: No  . Sexual activity: Never    Comment: postmenopause  Other Topics Concern  . Not on file  Social History Narrative  . Not on file   Social Determinants of Health   Financial Resource Strain:   . Difficulty of Paying Living Expenses:   Food Insecurity:   . Worried About Programme researcher, broadcasting/film/video in the Last Year:   . Barista in the Last Year:   Transportation Needs:   . Freight forwarder (Medical):   Marland Kitchen Lack of Transportation (Non-Medical):   Physical Activity:   . Days of Exercise per Week:   . Minutes of Exercise per Session:   Stress:   . Feeling of Stress :   Social Connections:   . Frequency of Communication  with Friends and Family:   . Frequency of Social Gatherings with Friends and Family:   . Attends Religious Services:   . Active Member of Clubs or Organizations:   . Attends Banker Meetings:   Marland Kitchen Marital Status:      Family History:  The patient's family history includes Hypertension in her mother; Stroke in her mother.  ROS:   Review of Systems  Unable to perform ROS: Dementia     EKGs/Labs/Other Studies Reviewed:    Studies reviewed were summarized above. The additional studies  were reviewed today:  2D echo 06/2019: 1. Left ventricular ejection fraction, by estimation, is 55 to 60%. The  left ventricle has normal function. The left ventricle has no regional  wall motion abnormalities. There is moderate left ventricular hypertrophy.  Left ventricular diastolic  parameters are consistent with Grade II diastolic dysfunction  (pseudonormalization).  2. Right ventricular systolic function is normal. The right ventricular  size is normal. There is moderately elevated pulmonary artery systolic  pressure. The estimated right ventricular systolic pressure is 54.6 mmHg.  3. Left atrial size was mildly dilated.   Comparison(s): EF 55-60%. __________  2D echo 03/2017: - Left ventricle: The cavity size was normal. There was mild  concentric hypertrophy. Systolic function was normal. The  estimated ejection fraction was in the range of 55% to 60%. Wall  motion was normal; there were no regional wall motion  abnormalities. Doppler parameters are consistent with abnormal  left ventricular relaxation (grade 1 diastolic dysfunction).  - Mitral valve: Calcified annulus. There was mild regurgitation.  ___________  Eugenie Birks MPI 04/2017:  Low risk, probably normal myocardial perfusion stress test.  There is a small in size, moderate in severity, partial reversible defect involving the apical anterior and apical segments that may represent artifact (attenuation and  apical thinning) but cannot rule out ischemia and scar.  There is a small in size, mild in severity, fixed basal and mid inferolateral defect most likely representing artifact (attenuation and misregistration) and less likely scar.  The left ventricular ejection fraction is normal (55-65%) with normal regional wall motion.   EKG:  EKG is ordered today.  The EKG ordered today demonstrates NSR, 73 bpm, nonspecific st t changes  Recent Labs: No results found for requested labs within last 8760 hours.  Recent Lipid Panel    Component Value Date/Time   CHOL 149 09/18/2014 0655   CHOL 144 03/30/2012 0512   TRIG 94 09/18/2014 0655   TRIG 103 03/30/2012 0512   HDL 33 (L) 09/18/2014 0655   HDL 33 (L) 03/30/2012 0512   CHOLHDL 4.5 09/18/2014 0655   VLDL 19 09/18/2014 0655   VLDL 21 03/30/2012 0512   LDLCALC 97 09/18/2014 0655   LDLCALC 90 03/30/2012 0512    PHYSICAL EXAM:    VS:  BP (!) 120/50 (BP Location: Left Arm, Patient Position: Sitting, Cuff Size: Normal)   Pulse 73   Ht 5' (1.524 m)   Wt 155 lb 2 oz (70.4 kg)   LMP  (LMP Unknown)   SpO2 97%   BMI 30.30 kg/m   BMI: Body mass index is 30.3 kg/m.  Physical Exam  Constitutional: She is oriented to person, place, and time. She appears well-developed and well-nourished.  HENT:  Head: Normocephalic and atraumatic.  Eyes: Right eye exhibits no discharge. Left eye exhibits no discharge.  Neck: No JVD present.  Cardiovascular: Normal rate, regular rhythm, S1 normal, S2 normal and normal heart sounds. Exam reveals no distant heart sounds, no friction rub, no midsystolic click and no opening snap.  No murmur heard. Pulses:      Posterior tibial pulses are 2+ on the right side and 2+ on the left side.  Pulmonary/Chest: Effort normal and breath sounds normal. No respiratory distress. She has no decreased breath sounds. She has no wheezes. She has no rales. She exhibits no tenderness.  Abdominal: Soft. She exhibits no distension.  There is no abdominal tenderness.  Musculoskeletal:        General: No edema.  Cervical back: Normal range of motion.  Neurological: She is alert and oriented to person, place, and time.  Skin: Skin is warm and dry. No cyanosis. Nails show no clubbing.  Psychiatric: She has a normal mood and affect. Her speech is normal and behavior is normal. Judgment and thought content normal.    Wt Readings from Last 3 Encounters:  09/01/19 155 lb 2 oz (70.4 kg)  05/28/19 160 lb 8 oz (72.8 kg)  07/25/18 154 lb 2 oz (69.9 kg)    ReDs vest: 30%   ASSESSMENT & PLAN:   1. HFpEF/pulmonary hypertension: She continues to note some exertional dyspnea that is mostly unchanged.  ReDs vest in the office today is normal.  Suspect a fair component of her dyspnea is related to physical deconditioning.  May need to titrate her Lasix briefly, based on follow up labs obtained today.  Discussed possible Lexiscan MPI to evaluate for ischemia, though given her underlying cognitive deficits and with recent study in 2019 showing no ischemia, this has been deferred.  No known COPD.  I do not think she would tolerate a sleep study, or even CPAP if this were needed given her dementia.  Consider pulmonology evaluation in follow up.  Her weight is down 5 pounds today when compared to her last visit (160-->155 pounds).  Check BMP and CBC.  CHF education.   2. HTN: Blood pressure is well controlled today.  Continue losartan, Lopressor, and Lasix  3. HLD: LDL 69 from 08/2018.  Remains on simvastatin.  Followed by PCP.   4. Bipolar disorder/dementia: Stable per husband.  Followed by PCP/psychiatry.   Disposition: F/u with Dr. Kirke Corin or an APP in 3 months.   Medication Adjustments/Labs and Tests Ordered: Current medicines are reviewed at length with the patient today.  Concerns regarding medicines are outlined above. Medication changes, Labs and Tests ordered today are summarized above and listed in the Patient Instructions  accessible in Encounters.   Signed, Eula Listen, PA-C 09/01/2019 1:26 PM     [facility] [address] [place], [place] [address] [phone]

## 2019-09-01 ENCOUNTER — Encounter: Payer: Self-pay | Admitting: Physician Assistant

## 2019-09-01 ENCOUNTER — Other Ambulatory Visit: Payer: Self-pay

## 2019-09-01 ENCOUNTER — Ambulatory Visit (INDEPENDENT_AMBULATORY_CARE_PROVIDER_SITE_OTHER): Payer: Medicare Other | Admitting: Physician Assistant

## 2019-09-01 VITALS — BP 120/50 | HR 73 | Ht 60.0 in | Wt 155.1 lb

## 2019-09-01 DIAGNOSIS — F0390 Unspecified dementia without behavioral disturbance: Secondary | ICD-10-CM

## 2019-09-01 DIAGNOSIS — I272 Pulmonary hypertension, unspecified: Secondary | ICD-10-CM | POA: Diagnosis not present

## 2019-09-01 DIAGNOSIS — I10 Essential (primary) hypertension: Secondary | ICD-10-CM | POA: Diagnosis not present

## 2019-09-01 DIAGNOSIS — I5032 Chronic diastolic (congestive) heart failure: Secondary | ICD-10-CM | POA: Diagnosis not present

## 2019-09-01 DIAGNOSIS — F319 Bipolar disorder, unspecified: Secondary | ICD-10-CM

## 2019-09-01 NOTE — Patient Instructions (Signed)
Medication Instructions:  Your physician recommends that you continue on your current medications as directed. Please refer to the Current Medication list given to you today.  *If you need a refill on your cardiac medications before your next appointment, please call your pharmacy*   Lab Work: Your physician recommends that you have lab work today(BMET, CBC)  If you have labs (blood work) drawn today and your tests are completely normal, you will receive your results only by: Marland Kitchen MyChart Message (if you have MyChart) OR . A paper copy in the mail If you have any lab test that is abnormal or we need to change your treatment, we will call you to review the results.   Testing/Procedures: None ordered    Follow-Up: At [facility], you and your health needs are our priority.  As part of our continuing mission to provide you with exceptional heart care, we have created designated Provider Care Teams.  These Care Teams include your primary Cardiologist (physician) and Advanced Practice Providers (APPs -  Physician Assistants and Nurse Practitioners) who all work together to provide you with the care you need, when you need it.  We recommend signing up for the patient portal called "MyChart".  Sign up information is provided on this After Visit Summary.  MyChart is used to connect with patients for Virtual Visits (Telemedicine).  Patients are able to view lab/test results, encounter notes, upcoming appointments, etc.  Non-urgent messages can be sent to your provider as well.   To learn more about what you can do with MyChart, go to [email or web address].    Your next appointment:   3 month(s)  The format for your next appointment:   In Person  Provider:    You may see Lorine Bears, MD or Eula Listen, PA-C

## 2019-09-02 LAB — BASIC METABOLIC PANEL
BUN/Creatinine Ratio: 22 (ref 12–28)
BUN: 18 mg/dL (ref 8–27)
CO2: 22 mmol/L (ref 20–29)
Calcium: 9 mg/dL (ref 8.7–10.3)
Chloride: 103 mmol/L (ref 96–106)
Creatinine, Ser: 0.83 mg/dL (ref 0.57–1.00)
GFR calc Af Amer: 76 mL/min/{1.73_m2} (ref >59)
GFR calc non Af Amer: 66 mL/min/{1.73_m2} (ref >59)
Glucose: 122 mg/dL — ABNORMAL HIGH (ref 65–99)
Potassium: 4 mmol/L (ref 3.5–5.2)
Sodium: 143 mmol/L (ref 134–144)

## 2019-09-02 LAB — CBC
Hematocrit: 31.4 % — ABNORMAL LOW (ref 34.0–46.6)
Hemoglobin: 10.6 g/dL — ABNORMAL LOW (ref 11.1–15.9)
MCH: 34.2 pg — ABNORMAL HIGH (ref 26.6–33.0)
MCHC: 33.8 g/dL (ref 31.5–35.7)
MCV: 101 fL — ABNORMAL HIGH (ref 79–97)
Platelets: 207 10*3/uL (ref 150–450)
RBC: 3.1 x10E6/uL — ABNORMAL LOW (ref 3.77–5.28)
RDW: 12.6 % (ref 11.7–15.4)
WBC: 6.3 10*3/uL (ref 3.4–10.8)

## 2019-09-22 ENCOUNTER — Other Ambulatory Visit: Payer: Self-pay | Admitting: Cardiovascular Disease

## 2019-09-22 ENCOUNTER — Other Ambulatory Visit: Payer: Self-pay | Admitting: Physician Assistant

## 2019-09-22 ENCOUNTER — Other Ambulatory Visit: Payer: Self-pay | Admitting: Psychiatry

## 2019-10-30 ENCOUNTER — Other Ambulatory Visit: Payer: Self-pay | Admitting: Psychiatry

## 2019-12-04 ENCOUNTER — Encounter: Payer: Self-pay | Admitting: Cardiovascular Disease

## 2019-12-04 ENCOUNTER — Ambulatory Visit (INDEPENDENT_AMBULATORY_CARE_PROVIDER_SITE_OTHER): Payer: Medicare Other | Admitting: Cardiovascular Disease

## 2019-12-04 ENCOUNTER — Other Ambulatory Visit: Payer: Self-pay

## 2019-12-04 VITALS — BP 130/80 | HR 81 | Ht 60.0 in | Wt 151.4 lb

## 2019-12-04 DIAGNOSIS — I5032 Chronic diastolic (congestive) heart failure: Secondary | ICD-10-CM

## 2019-12-04 DIAGNOSIS — I10 Essential (primary) hypertension: Secondary | ICD-10-CM | POA: Diagnosis not present

## 2019-12-04 DIAGNOSIS — E785 Hyperlipidemia, unspecified: Secondary | ICD-10-CM

## 2019-12-04 NOTE — Patient Instructions (Signed)

## 2019-12-04 NOTE — Progress Notes (Signed)
Cardiology Office Note   Date:  12/04/2019   ID:  Jenna Dudley, DOB 1936-01-18, MRN [id number]  PCP:  Aderoju, Marin Olp, MD  Cardiologist:   Lorine Bears, MD   Chief Complaint  Patient presents with  . office visit    3 month F/U; Meds verbally reviewed with patient.      History of Present Illness: Jenna Dudley is a 84 y.o. female who presents for a follow-up visit regarding chronic diastolic heart failure. She has multiple chronic medical conditions that include bipolar disorder requiring regular ECT treatments, hypertension, hyperlipidemia, GERD, chronic low back pain and early dementia. Echocardiogram in January 2019 showed an EF of 55 to 60% with grade 1 diastolic dysfunction and mild mitral regurgitation. Lexiscan Myoview in February 2019 showed no clear evidence of ischemia but the study was suboptimal. She has been doing reasonably well with no recent chest pain or worsening dyspnea.  She fell in June and since then she has been having significant left arm and shoulder discomfort.  She did not seek medical attention for that and the pain persisted.  She has not had x-rays.   Past Medical History:  Diagnosis Date  . Bilateral carpal tunnel syndrome 07/27/14  . Bipolar disorder [facility])    geropsych admission to [facility] in Dec 2018  . Chronic diastolic CHF (congestive heart failure) ([facility])    a. 03/2017 Echo: EF 55-60%, no rwma, Gr1 DD, mild MR.  . Chronic low back pain 07/29/14  . Degenerative arthritis of lumbar spine 07/27/14  . Dementia ([facility])   . Depression   . Esophageal spasm 07/29/14  . GERD (gastroesophageal reflux disease)   . History of stress test    a. 04/2017 MV: low risk stress test.  EF 55-65%. Probable apical ant/apical, basal, and mid inflat attenuation artifact vs ischemia/scar.  . Hyperlipemia 07/29/14  . Hypertension   . Pneumonia   . Spinal stenosis 07/29/14    Past Surgical History:  Procedure Laterality Date  . HIP SURGERY Right   . INNER  EAR SURGERY Right   . REPLACEMENT TOTAL KNEE BILATERAL Bilateral 07/27/14     Current Outpatient Medications  Medication Sig Dispense Refill  . acetaminophen-codeine (TYLENOL #3) 300-30 MG per tablet Take 1 tablet by mouth every 8 (eight) hours as needed for moderate pain or severe pain.    . cholecalciferol (VITAMIN D) 1000 units tablet Take 1,000 Units by mouth daily.    . ferrous sulfate 325 (65 FE) MG EC tablet Take 325 mg by mouth daily with breakfast.    . furosemide (LASIX) 20 MG tablet TAKE 1 TABLET BY MOUTH ONCE DAILY 30 tablet 2  . losartan (COZAAR) 25 MG tablet Take 1 tablet (25 mg total) by mouth daily. 30 tablet 0  . magnesium oxide (MAG-OX) 400 MG tablet Take 400 mg by mouth daily.    . metoprolol tartrate (LOPRESSOR) 50 MG tablet Take 1 tablet (50 mg total) by mouth 2 (two) times daily. 60 tablet 2  . mirtazapine (REMERON) 45 MG tablet TAKE 1 TABLET BY MOUTH AT BEDTIME 30 tablet 6  . OLANZapine (ZYPREXA) 20 MG tablet TAKE 1 TABLET BY MOUTH AT BEDTIME 30 tablet 1  . omeprazole (PRILOSEC) 20 MG capsule Take 20 mg by mouth 2 (two) times daily before a meal.     . Probiotic Product (PROBIOTIC DAILY PO) Take by mouth daily.    . simvastatin (ZOCOR) 40 MG tablet Take 40 mg by mouth at bedtime.     Marland Kitchen  venlafaxine XR (EFFEXOR-XR) 150 MG 24 hr capsule TAKE 1 CAPSULE BY MOUTH ONCE DAILY WITH BREAKFAST 30 capsule 6   No current facility-administered medications for this visit.   Facility-Administered Medications Ordered in Other Visits  Medication Dose Route Frequency Provider Last Rate Last Admin  . 0.9 %  sodium chloride infusion    Continuous PRN Lily Kocher, CRNA   Continued from Pre-op at 08/07/18 1110  . lactated ringers infusion   Intravenous Continuous Clapacs, Jackquline Denmark, MD   New Bag at 07/09/19 1014    Allergies:   Aspirin and Sulfa antibiotics    Social History:  The patient  reports that she quit smoking about 44 years ago. Her smoking use included cigarettes. She has  never used smokeless tobacco. She reports that she does not drink alcohol and does not use drugs.   Family History:  The patient's family history includes Hypertension in her mother; Stroke in her mother.    ROS:  Please see the history of present illness.   Otherwise, review of systems are positive for none.   All other systems are reviewed and negative.    PHYSICAL EXAM: VS:  BP 130/80 (BP Location: Left Arm, Patient Position: Sitting, Cuff Size: Normal)   Pulse 81   Ht 5' (1.524 m)   Wt 151 lb 6 oz (68.7 kg)   LMP  (LMP Unknown)   SpO2 95%   BMI 29.56 kg/m  , BMI Body mass index is 29.56 kg/m. GEN: Well nourished, well developed, in no acute distress  HEENT: normal  Neck: no JVD, carotid bruits, or masses Cardiac: RRR; no murmurs, rubs, or gallops,no edema  Respiratory:  clear to auscultation bilaterally, normal work of breathing GI: soft, nontender, nondistended, + BS MS: no deformity or atrophy  Skin: warm and dry, no rash Neuro:  Strength and sensation are intact Psych: euthymic mood, full affect   EKG:  EKG is ordered today. The ekg ordered today demonstrates sinus rhythm with possible old septal infarct.   Recent Labs: 09/01/2019: BUN 18; Creatinine, Ser 0.83; Hemoglobin 10.6; Platelets 207; Potassium 4.0; Sodium 143    Lipid Panel    Component Value Date/Time   CHOL 149 09/18/2014 0655   CHOL 144 03/30/2012 0512   TRIG 94 09/18/2014 0655   TRIG 103 03/30/2012 0512   HDL 33 (L) 09/18/2014 0655   HDL 33 (L) 03/30/2012 0512   CHOLHDL 4.5 09/18/2014 0655   VLDL 19 09/18/2014 0655   VLDL 21 03/30/2012 0512   LDLCALC 97 09/18/2014 0655   LDLCALC 90 03/30/2012 0512      Wt Readings from Last 3 Encounters:  12/04/19 151 lb 6 oz (68.7 kg)  09/01/19 155 lb 2 oz (70.4 kg)  05/28/19 160 lb 8 oz (72.8 kg)       No flowsheet data found.    ASSESSMENT AND PLAN:  1.  Chronic diastolic heart failure: She appears to be euvolemic on small dose furosemide.   Continue medical therapy.  Most recent labs in June showed normal renal function and electrolytes.  2.  Essential hypertension: Blood pressure is well controlled.  3.  Hyperlipidemia: Currently on simvastatin 40 mg daily.  4.  Left arm and shoulder pain post fall in June: She has not had x-rays done.  I instructed them to seek evaluation at [facility] urgent care.  She needs to have x-rays.   Disposition:   FU with me in 6 months  Signed,  Lorine Bears, MD  12/04/2019 2:13  PM    [facility]

## 2019-12-18 ENCOUNTER — Other Ambulatory Visit: Payer: Self-pay | Admitting: Psychiatry

## 2019-12-30 ENCOUNTER — Other Ambulatory Visit: Payer: Self-pay | Admitting: Psychiatry

## 2019-12-30 ENCOUNTER — Other Ambulatory Visit: Payer: Self-pay | Admitting: Physician Assistant

## 2020-01-26 ENCOUNTER — Other Ambulatory Visit: Payer: Self-pay | Admitting: Physician Assistant

## 2020-01-26 ENCOUNTER — Other Ambulatory Visit: Payer: Self-pay | Admitting: Psychiatry

## 2020-01-26 DIAGNOSIS — F3160 Bipolar disorder, current episode mixed, unspecified: Secondary | ICD-10-CM

## 2020-01-26 NOTE — Telephone Encounter (Signed)
Rx request sent to pharmacy.  

## 2020-02-10 ENCOUNTER — Other Ambulatory Visit: Payer: Self-pay | Admitting: Psychiatry

## 2020-02-10 DIAGNOSIS — F3160 Bipolar disorder, current episode mixed, unspecified: Secondary | ICD-10-CM

## 2020-02-10 MED ORDER — VENLAFAXINE HCL ER 150 MG PO CP24: ORAL_CAPSULE | ORAL | 6 refills | Status: DC

## 2020-02-10 MED ORDER — OLANZAPINE 20 MG PO TABS: 20.0000 mg | ORAL_TABLET | Freq: Every day | ORAL | 6 refills | Status: DC

## 2020-02-10 MED ORDER — MIRTAZAPINE 45 MG PO TABS: 45.0000 mg | ORAL_TABLET | Freq: Every day | ORAL | 6 refills | Status: DC

## 2020-03-26 ENCOUNTER — Other Ambulatory Visit: Payer: Self-pay | Admitting: Cardiovascular Disease

## 2020-07-28 ENCOUNTER — Other Ambulatory Visit: Payer: Self-pay | Admitting: Cardiovascular Disease

## 2020-08-02 ENCOUNTER — Other Ambulatory Visit: Payer: Self-pay | Admitting: Physician Assistant

## 2020-08-02 NOTE — Telephone Encounter (Signed)
Pt is scheduled for appt with you tomorrow, 08/03/20. Please advise on refill at that time. Thank you!

## 2020-08-03 ENCOUNTER — Ambulatory Visit (INDEPENDENT_AMBULATORY_CARE_PROVIDER_SITE_OTHER): Payer: Medicare Other | Admitting: Family

## 2020-08-03 ENCOUNTER — Other Ambulatory Visit: Payer: Self-pay

## 2020-08-03 ENCOUNTER — Encounter: Payer: Self-pay | Admitting: Family

## 2020-08-03 VITALS — BP 110/50 | HR 59 | Ht 60.0 in | Wt 121.0 lb

## 2020-08-03 DIAGNOSIS — R001 Bradycardia, unspecified: Secondary | ICD-10-CM | POA: Diagnosis not present

## 2020-08-03 DIAGNOSIS — F319 Bipolar disorder, unspecified: Secondary | ICD-10-CM | POA: Diagnosis not present

## 2020-08-03 DIAGNOSIS — I10 Essential (primary) hypertension: Secondary | ICD-10-CM

## 2020-08-03 DIAGNOSIS — E785 Hyperlipidemia, unspecified: Secondary | ICD-10-CM

## 2020-08-03 DIAGNOSIS — I5032 Chronic diastolic (congestive) heart failure: Secondary | ICD-10-CM | POA: Diagnosis not present

## 2020-08-03 DIAGNOSIS — F0390 Unspecified dementia without behavioral disturbance: Secondary | ICD-10-CM

## 2020-08-03 MED ORDER — METOPROLOL TARTRATE 50 MG PO TABS: 50.0000 mg | ORAL_TABLET | Freq: Two times a day (BID) | ORAL | 1 refills | Status: DC

## 2020-08-03 NOTE — Progress Notes (Signed)
Office Visit    Patient Name: Jenna Dudley Date of Encounter: 08/03/2020  PCP:  Toya Smothers, MD   [facility]  Cardiologist:  Lorine Bears, MD  Advanced Practice Provider:  No care team member to display Electrophysiologist:  None   Chief Complaint    Jenna Dudley is a 85 y.o. female with a hx of chronic diastolic heart failure, bipolar disorder requiring regular ECT treatments, hypertension, hyperlipidemia, GERD, chronic low back pain, early dementia presents today for follow up of diastolic heart failure.   Past Medical History    Past Medical History:  Diagnosis Date  . Bilateral carpal tunnel syndrome 07/27/14  . Bipolar disorder [facility])    geropsych admission to [facility] in Dec 2018  . Chronic diastolic CHF (congestive heart failure) (HCC)    a. 03/2017 Echo: EF 55-60%, no rwma, Gr1 DD, mild MR.  . Chronic low back pain 07/29/14  . Degenerative arthritis of lumbar spine 07/27/14  . Dementia (HCC)   . Depression   . Esophageal spasm 07/29/14  . GERD (gastroesophageal reflux disease)   . History of stress test    a. 04/2017 MV: low risk stress test.  EF 55-65%. Probable apical ant/apical, basal, and mid inflat attenuation artifact vs ischemia/scar.  . Hyperlipemia 07/29/14  . Hypertension   . Pneumonia   . Spinal stenosis 07/29/14   Past Surgical History:  Procedure Laterality Date  . HIP SURGERY Right   . INNER EAR SURGERY Right   . REPLACEMENT TOTAL KNEE BILATERAL Bilateral 07/27/14    Allergies  Allergies  Allergen Reactions  . Aspirin Other (See Comments)    Unknown reaction  . Sulfa Antibiotics Other (See Comments)    Unknown reaction    History of Present Illness    Jenna Dudley is a 85 y.o. female with a hx of chronic diastolic heart failure, bipolar disorder previously on ECT treatments, hypertension, hyperlipidemia, GERD, chronic low back pain, early dementia. She was  last seen  12/04/19 by Dr. Kirke Corin.  Previous  echocardiogram January 2019 LVEF 55 to 60%, grade 1 diastolic dysfunction, mild MR.  She Lexiscan Myoview February 2019 with no evidence of ischemia though study was suboptimal.  When last seen by Dr. Nahunta Sink 12/04/2019 she was doing overall well from a cardiac perspective though had a fall and was encouraged to follow-up with orthopedics regarding left arm and shoulder discomfort.  No changes were made to her cardiac medications.  She presents today for follow-up with her husband who assists with history.  She reports no chest pain, pressure, tightness.  Does note pain in her right foot for which she just saw podiatry last week for ingrown toenail and is hoping this pain improves.  She notes her dyspnea on exertion is stable at baseline.  We discussed that deconditioning could be contributory and the importance of exercise such as walking regimen.  She endorses being overall sedentary.  Endorses following a low-sodium, heart healthy diet.  Reports no edema, orthopnea, PND.  Does not check blood pressure routinely at home.  Does check heart rate and is routinely in the 70s.  Reports no lightheadedness, dizziness, near-syncope, syncope.  EKGs/Labs/Other Studies Reviewed:   The following studies were reviewed today:  EKG:  EKG is ordered today.  The ekg ordered today demonstrates SB 59 bpm with stable possible prior septal infarct and no acute ST/T wave changes.   Recent Labs: 09/01/2019: BUN 18; Creatinine, Ser 0.83; Hemoglobin 10.6; Platelets 207;  Potassium 4.0; Sodium 143  Recent Lipid Panel    Component Value Date/Time   CHOL 149 09/18/2014 0655   CHOL 144 03/30/2012 0512   TRIG 94 09/18/2014 0655   TRIG 103 03/30/2012 0512   HDL 33 (L) 09/18/2014 0655   HDL 33 (L) 03/30/2012 0512   CHOLHDL 4.5 09/18/2014 0655   VLDL 19 09/18/2014 0655   VLDL 21 03/30/2012 0512   LDLCALC 97 09/18/2014 0655   LDLCALC 90 03/30/2012 0512   Home Medications   Current Meds  Medication Sig  .  acetaminophen-codeine (TYLENOL #3) 300-30 MG per tablet Take 1 tablet by mouth every 8 (eight) hours as needed for moderate pain or severe pain.  . cholecalciferol (VITAMIN D) 1000 units tablet Take 1,000 Units by mouth daily.  . ferrous sulfate 325 (65 FE) MG EC tablet Take 325 mg by mouth daily with breakfast.  . furosemide (LASIX) 20 MG tablet TAKE 1 TABLET BY MOUTH ONCE DAILY  . Lactobacillus Acid-Pectin (ACIDOPHILUS/PECTIN) CAPS Take by mouth.  . losartan (COZAAR) 25 MG tablet Take 1 tablet (25 mg total) by mouth daily.  . magnesium oxide (MAG-OX) 400 MG tablet Take 400 mg by mouth daily.  . metoprolol tartrate (LOPRESSOR) 50 MG tablet TAKE 1 TABLET BY MOUTH TWICE DAILY *NEEDS APPT*  . mirtazapine (REMERON) 45 MG tablet Take 1 tablet (45 mg total) by mouth at bedtime.  . naloxone (NARCAN) nasal spray 4 mg/0.1 mL SMARTSIG:1 Spray(s) Both Nares Daily PRN  . OLANZapine (ZYPREXA) 20 MG tablet Take 1 tablet (20 mg total) by mouth at bedtime.  Marland Kitchen omeprazole (PRILOSEC) 20 MG capsule Take 20 mg by mouth 2 (two) times daily before a meal.   . Probiotic Product (PROBIOTIC DAILY PO) Take by mouth daily.  . simvastatin (ZOCOR) 40 MG tablet Take 40 mg by mouth at bedtime.   Marland Kitchen venlafaxine XR (EFFEXOR-XR) 150 MG 24 hr capsule 1 po daily  . [DISCONTINUED] mirtazapine (REMERON) 45 MG tablet TAKE 1 TABLET BY MOUTH AT BEDTIME  . [DISCONTINUED] OLANZapine (ZYPREXA) 20 MG tablet TAKE 1 TABLET BY MOUTH AT BEDTIME     Review of Systems  All other systems reviewed and are otherwise negative except as noted above.  Physical Exam    VS:  BP (!) 110/50 (BP Location: Left Arm, Patient Position: Sitting, Cuff Size: Normal)   Pulse (!) 59   Ht 5' (1.524 m)   Wt 121 lb (54.9 kg)   LMP  (LMP Unknown)   SpO2 97%   BMI 23.63 kg/m  , BMI Body mass index is 23.63 kg/m.  Wt Readings from Last 3 Encounters:  08/03/20 121 lb (54.9 kg)  12/04/19 151 lb 6 oz (68.7 kg)  09/01/19 155 lb 2 oz (70.4 kg)    GEN: Well  nourished, well developed, in no acute distress. HEENT: normal. Neck: Supple, no JVD, carotid bruits, or masses. Cardiac: RRR, no murmurs, rubs, or gallops. No clubbing, cyanosis, edema.  Radials/PT 2+ and equal bilaterally.  Respiratory:  Respirations regular and unlabored, clear to auscultation bilaterally. GI: Soft, nontender, nondistended. MS: No deformity or atrophy. Skin: Warm and dry, no rash. Neuro:  Strength and sensation are intact. Psych: Normal affect.  Assessment & Plan    1. Chronic diastolic heart failure- Euvolemic and well compensated on exam. Dyspnea stable at her baseline and physical deconditioning contributory. Weight trending down. Continue Lasix 20mg  QD, refill provided. Renal function and potassium stable 02/2020. Heart healthy diet and regular cardiovascular exercise encouraged.  2. Sinus bradycardia- EKG today SB 59 bpm. No lightheadedness, near syncope, syncope, fatigue. Reports home HR routinely 70s. She or her husband will contact our office if heart rate at home is routinely less than 70bpm and reduced dose of Metoprolol could be considered.  3. Hypertension- BP well controlled. Continue current antihypertensive regimen.   4. Hyperlipidemia- Continue Simvastatin 40mg  QD.   5. Bipolar disorder/dementia - History assisted by husband today. Continue to follow with PCP/psychiatry   Disposition: Follow up in 6 month(s) with Dr. or APP   Signed, Kirke Corin, NP 08/03/2020, 11:03 AM [facility]

## 2020-08-03 NOTE — Patient Instructions (Signed)
Medication Instructions:  Continue your current medications.   *If you need a refill on your cardiac medications before your next appointment, please call your pharmacy*   Lab Work: None ordered today.   Testing/Procedures: Your EKG today showed sinus bradycardia which is a normal heart rhythm that is just a tad slow - this is not of concern.   Follow-Up: At [facility], you and your health needs are our priority.  As part of our continuing mission to provide you with exceptional heart care, we have created designated Provider Care Teams.  These Care Teams include your primary Cardiologist (physician) and Advanced Practice Providers (APPs -  Physician Assistants and Nurse Practitioners) who all work together to provide you with the care you need, when you need it.  We recommend signing up for the patient portal called "MyChart".  Sign up information is provided on this After Visit Summary.  MyChart is used to connect with patients for Virtual Visits (Telemedicine).  Patients are able to view lab/test results, encounter notes, upcoming appointments, etc.  Non-urgent messages can be sent to your provider as well.   To learn more about what you can do with MyChart, go to [email or web address].    Your next appointment:   6 month(s)  The format for your next appointment:   In Person  Provider:   You may see Lorine Bears, MD or one of the following Advanced Practice Providers on your designated Care Team:    Nicolasa Ducking, NP  Eula Listen, PA-C  Marisue Ivan, PA-C  Cadence Koyuk, [place]  Gillian Shields, NP  Other Instructions  If your heart rate is consistently less than 70 beats per minute at home, please call our office and we may change your dose of Metoprolol.  Heart Healthy Diet Recommendations: A low-salt diet is recommended. Meats should be grilled, baked, or boiled. Avoid fried foods. Focus on lean protein sources like fish or chicken with vegetables and  fruits. The American Heart Association is a Chief Technology Officer!  American Heart Association Diet and Lifeystyle Recommendations   Exercise recommendations: The American Heart Association recommends 150 minutes of moderate intensity exercise weekly. Try 30 minutes of moderate intensity exercise 4-5 times per week. This could include walking, jogging, or swimming.

## 2020-12-10 ENCOUNTER — Emergency Department: Payer: Medicare Other

## 2020-12-10 ENCOUNTER — Inpatient Hospital Stay
Admission: EM | Admit: 2020-12-10 | Discharge: 2020-12-17 | DRG: 291 | Disposition: A | Discharge status: Home Health | Payer: Medicare Other | Attending: Internal Medicine | Admitting: Internal Medicine

## 2020-12-10 ENCOUNTER — Encounter: Payer: Self-pay | Admitting: Radiology

## 2020-12-10 ENCOUNTER — Other Ambulatory Visit: Payer: Self-pay

## 2020-12-10 DIAGNOSIS — K56600 Partial intestinal obstruction, unspecified as to cause: Secondary | ICD-10-CM | POA: Diagnosis present

## 2020-12-10 DIAGNOSIS — R0602 Shortness of breath: Secondary | ICD-10-CM

## 2020-12-10 DIAGNOSIS — Z9889 Other specified postprocedural states: Secondary | ICD-10-CM

## 2020-12-10 DIAGNOSIS — G8929 Other chronic pain: Secondary | ICD-10-CM | POA: Diagnosis present

## 2020-12-10 DIAGNOSIS — I272 Pulmonary hypertension, unspecified: Secondary | ICD-10-CM | POA: Diagnosis present

## 2020-12-10 DIAGNOSIS — Z20822 Contact with and (suspected) exposure to covid-19: Secondary | ICD-10-CM | POA: Diagnosis present

## 2020-12-10 DIAGNOSIS — N3090 Cystitis, unspecified without hematuria: Secondary | ICD-10-CM | POA: Diagnosis present

## 2020-12-10 DIAGNOSIS — Z8249 Family history of ischemic heart disease and other diseases of the circulatory system: Secondary | ICD-10-CM

## 2020-12-10 DIAGNOSIS — M545 Low back pain, unspecified: Secondary | ICD-10-CM | POA: Diagnosis present

## 2020-12-10 DIAGNOSIS — Z886 Allergy status to analgesic agent status: Secondary | ICD-10-CM

## 2020-12-10 DIAGNOSIS — I10 Essential (primary) hypertension: Secondary | ICD-10-CM | POA: Diagnosis present

## 2020-12-10 DIAGNOSIS — R5381 Other malaise: Secondary | ICD-10-CM | POA: Diagnosis present

## 2020-12-10 DIAGNOSIS — I509 Heart failure, unspecified: Secondary | ICD-10-CM

## 2020-12-10 DIAGNOSIS — Z96653 Presence of artificial knee joint, bilateral: Secondary | ICD-10-CM | POA: Diagnosis present

## 2020-12-10 DIAGNOSIS — Z8673 Personal history of transient ischemic attack (TIA), and cerebral infarction without residual deficits: Secondary | ICD-10-CM

## 2020-12-10 DIAGNOSIS — E785 Hyperlipidemia, unspecified: Secondary | ICD-10-CM | POA: Diagnosis present

## 2020-12-10 DIAGNOSIS — R339 Retention of urine, unspecified: Secondary | ICD-10-CM | POA: Diagnosis present

## 2020-12-10 DIAGNOSIS — H9190 Unspecified hearing loss, unspecified ear: Secondary | ICD-10-CM | POA: Diagnosis present

## 2020-12-10 DIAGNOSIS — D638 Anemia in other chronic diseases classified elsewhere: Secondary | ICD-10-CM | POA: Diagnosis present

## 2020-12-10 DIAGNOSIS — I5033 Acute on chronic diastolic (congestive) heart failure: Secondary | ICD-10-CM | POA: Diagnosis present

## 2020-12-10 DIAGNOSIS — Z79899 Other long term (current) drug therapy: Secondary | ICD-10-CM

## 2020-12-10 DIAGNOSIS — I340 Nonrheumatic mitral (valve) insufficiency: Secondary | ICD-10-CM | POA: Diagnosis present

## 2020-12-10 DIAGNOSIS — E559 Vitamin D deficiency, unspecified: Secondary | ICD-10-CM | POA: Diagnosis present

## 2020-12-10 DIAGNOSIS — Z87891 Personal history of nicotine dependence: Secondary | ICD-10-CM

## 2020-12-10 DIAGNOSIS — F0390 Unspecified dementia without behavioral disturbance: Secondary | ICD-10-CM | POA: Diagnosis present

## 2020-12-10 DIAGNOSIS — K219 Gastro-esophageal reflux disease without esophagitis: Secondary | ICD-10-CM | POA: Diagnosis present

## 2020-12-10 DIAGNOSIS — D649 Anemia, unspecified: Secondary | ICD-10-CM | POA: Diagnosis present

## 2020-12-10 DIAGNOSIS — I110 Hypertensive heart disease with heart failure: Principal | ICD-10-CM | POA: Diagnosis present

## 2020-12-10 DIAGNOSIS — F05 Delirium due to known physiological condition: Secondary | ICD-10-CM

## 2020-12-10 DIAGNOSIS — R410 Disorientation, unspecified: Secondary | ICD-10-CM

## 2020-12-10 DIAGNOSIS — Z882 Allergy status to sulfonamides status: Secondary | ICD-10-CM

## 2020-12-10 DIAGNOSIS — I081 Rheumatic disorders of both mitral and tricuspid valves: Secondary | ICD-10-CM | POA: Diagnosis present

## 2020-12-10 DIAGNOSIS — F32A Depression, unspecified: Secondary | ICD-10-CM

## 2020-12-10 DIAGNOSIS — I5032 Chronic diastolic (congestive) heart failure: Secondary | ICD-10-CM

## 2020-12-10 DIAGNOSIS — Z23 Encounter for immunization: Secondary | ICD-10-CM

## 2020-12-10 DIAGNOSIS — K56609 Unspecified intestinal obstruction, unspecified as to partial versus complete obstruction: Secondary | ICD-10-CM

## 2020-12-10 DIAGNOSIS — J90 Pleural effusion, not elsewhere classified: Secondary | ICD-10-CM

## 2020-12-10 DIAGNOSIS — I5031 Acute diastolic (congestive) heart failure: Secondary | ICD-10-CM | POA: Diagnosis not present

## 2020-12-10 DIAGNOSIS — F319 Bipolar disorder, unspecified: Secondary | ICD-10-CM | POA: Diagnosis present

## 2020-12-10 LAB — COMPREHENSIVE METABOLIC PANEL
ALT: 15 U/L (ref 0–44)
AST: 19 U/L (ref 15–41)
Albumin: 3.7 g/dL (ref 3.5–5.0)
Alkaline Phosphatase: 60 U/L (ref 38–126)
Anion gap: 7 (ref 5–15)
BUN: 23 mg/dL (ref 8–23)
CO2: 26 mmol/L (ref 22–32)
Calcium: 8.5 mg/dL — ABNORMAL LOW (ref 8.9–10.3)
Chloride: 105 mmol/L (ref 98–111)
Creatinine, Ser: 0.96 mg/dL (ref 0.44–1.00)
GFR, Estimated: 59 mL/min — ABNORMAL LOW (ref >60)
Glucose, Bld: 119 mg/dL — ABNORMAL HIGH (ref 70–99)
Potassium: 4.5 mmol/L (ref 3.5–5.1)
Sodium: 138 mmol/L (ref 135–145)
Total Bilirubin: 0.7 mg/dL (ref 0.3–1.2)
Total Protein: 6.4 g/dL — ABNORMAL LOW (ref 6.5–8.1)

## 2020-12-10 LAB — CBC
HCT: 28.5 % — ABNORMAL LOW (ref 36.0–46.0)
Hemoglobin: 9.5 g/dL — ABNORMAL LOW (ref 12.0–15.0)
MCH: 38.9 pg — ABNORMAL HIGH (ref 26.0–34.0)
MCHC: 33.3 g/dL (ref 30.0–36.0)
MCV: 116.8 fL — ABNORMAL HIGH (ref 80.0–100.0)
Platelets: 217 10*3/uL (ref 150–400)
RBC: 2.44 MIL/uL — ABNORMAL LOW (ref 3.87–5.11)
RDW: 14.6 % (ref 11.5–15.5)
WBC: 4.5 10*3/uL (ref 4.0–10.5)
nRBC: 0 % (ref 0.0–0.2)

## 2020-12-10 LAB — BRAIN NATRIURETIC PEPTIDE: B Natriuretic Peptide: 1480.5 pg/mL — ABNORMAL HIGH (ref 0.0–100.0)

## 2020-12-10 LAB — RESP PANEL BY RT-PCR (FLU A&B, COVID) ARPGX2
Influenza A by PCR: NEGATIVE
Influenza B by PCR: NEGATIVE
SARS Coronavirus 2 by RT PCR: NEGATIVE

## 2020-12-10 MED ORDER — OLANZAPINE 10 MG PO TABS
20.0000 mg | ORAL_TABLET | Freq: Every day | ORAL | Status: DC
  Administered 2020-12-11 – 2020-12-16 (×6): 20 mg via ORAL
  Filled 2020-12-10 (×7): qty 2

## 2020-12-10 MED ORDER — FUROSEMIDE 10 MG/ML IJ SOLN
60.0000 mg | Freq: Once | INTRAMUSCULAR | Status: AC
  Administered 2020-12-10: 60 mg via INTRAVENOUS
  Filled 2020-12-10: qty 8

## 2020-12-10 MED ORDER — MORPHINE SULFATE (PF) 4 MG/ML IV SOLN: 4.0000 mg | Freq: Once | INTRAVENOUS | Status: DC

## 2020-12-10 MED ORDER — ONDANSETRON HCL 4 MG PO TABS: 4.0000 mg | ORAL_TABLET | Freq: Four times a day (QID) | ORAL | Status: DC | PRN

## 2020-12-10 MED ORDER — ACETAMINOPHEN 650 MG RE SUPP
650.0000 mg | Freq: Four times a day (QID) | RECTAL | Status: DC | PRN
  Administered 2020-12-15: 650 mg via RECTAL
  Filled 2020-12-10: qty 1

## 2020-12-10 MED ORDER — LOSARTAN POTASSIUM 25 MG PO TABS
25.0000 mg | ORAL_TABLET | Freq: Every day | ORAL | Status: DC
  Administered 2020-12-11 – 2020-12-17 (×7): 25 mg via ORAL
  Filled 2020-12-10 (×7): qty 1

## 2020-12-10 MED ORDER — MORPHINE SULFATE (PF) 2 MG/ML IV SOLN
2.0000 mg | Freq: Once | INTRAVENOUS | Status: AC
  Administered 2020-12-10: 2 mg via INTRAVENOUS
  Filled 2020-12-10: qty 1

## 2020-12-10 MED ORDER — ENOXAPARIN SODIUM 40 MG/0.4ML IJ SOSY
40.0000 mg | PREFILLED_SYRINGE | INTRAMUSCULAR | Status: DC
  Administered 2020-12-11 – 2020-12-15 (×5): 40 mg via SUBCUTANEOUS
  Filled 2020-12-10 (×5): qty 0.4

## 2020-12-10 MED ORDER — ACETAMINOPHEN 325 MG PO TABS
650.0000 mg | ORAL_TABLET | Freq: Four times a day (QID) | ORAL | Status: DC | PRN
  Administered 2020-12-11 – 2020-12-15 (×3): 650 mg via ORAL
  Filled 2020-12-10 (×3): qty 2

## 2020-12-10 MED ORDER — METOPROLOL TARTRATE 50 MG PO TABS
50.0000 mg | ORAL_TABLET | Freq: Two times a day (BID) | ORAL | Status: DC
  Administered 2020-12-11 – 2020-12-15 (×9): 50 mg via ORAL
  Filled 2020-12-10 (×9): qty 1

## 2020-12-10 MED ORDER — ONDANSETRON HCL 4 MG/2ML IJ SOLN: 4.0000 mg | Freq: Four times a day (QID) | INTRAMUSCULAR | Status: DC | PRN

## 2020-12-10 MED ORDER — FUROSEMIDE 10 MG/ML IJ SOLN
40.0000 mg | Freq: Two times a day (BID) | INTRAMUSCULAR | Status: DC
  Administered 2020-12-11: 40 mg via INTRAVENOUS
  Filled 2020-12-10: qty 4

## 2020-12-10 MED ORDER — ACETAMINOPHEN-CODEINE #3 300-30 MG PO TABS
1.0000 | ORAL_TABLET | Freq: Three times a day (TID) | ORAL | Status: DC | PRN
  Administered 2020-12-11 – 2020-12-16 (×2): 1 via ORAL
  Filled 2020-12-10 (×6): qty 1

## 2020-12-10 MED ORDER — PANTOPRAZOLE SODIUM 40 MG PO TBEC
40.0000 mg | DELAYED_RELEASE_TABLET | Freq: Every day | ORAL | Status: DC
  Administered 2020-12-11 – 2020-12-17 (×7): 40 mg via ORAL
  Filled 2020-12-10 (×7): qty 1

## 2020-12-10 MED ORDER — SIMVASTATIN 20 MG PO TABS
40.0000 mg | ORAL_TABLET | Freq: Every day | ORAL | Status: DC
  Administered 2020-12-11 – 2020-12-16 (×6): 40 mg via ORAL
  Filled 2020-12-10: qty 4
  Filled 2020-12-10 (×5): qty 2

## 2020-12-10 MED ORDER — MIRTAZAPINE 15 MG PO TABS
45.0000 mg | ORAL_TABLET | Freq: Every day | ORAL | Status: DC
  Administered 2020-12-11 – 2020-12-16 (×6): 45 mg via ORAL
  Filled 2020-12-10 (×6): qty 3

## 2020-12-10 MED ORDER — VENLAFAXINE HCL ER 75 MG PO CP24
150.0000 mg | ORAL_CAPSULE | Freq: Every day | ORAL | Status: DC
  Administered 2020-12-11 – 2020-12-17 (×7): 150 mg via ORAL
  Filled 2020-12-10: qty 2
  Filled 2020-12-10 (×2): qty 1
  Filled 2020-12-10: qty 2
  Filled 2020-12-10: qty 1
  Filled 2020-12-10 (×3): qty 2

## 2020-12-10 NOTE — ED Triage Notes (Signed)
Per pt's son, pt had a ct today and was told by md to come to ed. Per son ct said she had fluid on her lungs and a possible blockage in her abd. Pt appears in no acute distress, pt without vomiting or fever per son.

## 2020-12-10 NOTE — ED Notes (Signed)
Pt discussed with Dr. Vicente Males, to change patient acuity level 2.

## 2020-12-10 NOTE — ED Provider Notes (Signed)
[facility] Emergency Department Provider Note ____________________________________________   Event Date/Time   First MD Initiated Contact with Patient 12/10/20 2202     (approximate)  I have reviewed the triage vital signs and the nursing notes.  HISTORY  Chief Complaint Abdominal Pain   HPI Jenna Dudley is a 85 y.o. femalewho presents to the ED for evaluation of abd pain and abnormal outpatient CT  Chart review indicates patient had an outpatient CT abdomen/pelvis with contrast performed yesterday, 9/15.  I can see these written results, but cannot see any images, which demonstrate mildly dilated loops of bowel to the left lower quadrant with a transition point within the left lower abdomen, possibly representing early SBO.  Moderate right-sided pleural effusion.  These results, she was referred to the ED for evaluation.  Has a history of diastolic dysfunction, dementia, HTN and HLD. She lives at home with her husband.  Husband provides majority of history due to patient's disorientation and dementia.  They report that for the past 3-4 days she has had increasing shortness of breath, particularly while lying flat at night while in bed.  Further reports watery diarrhea.  They report that she has had abdominal pain, particularly when you press on her belly.  No emesis, fevers, chest pain or syncope.  History somewhat limited due to patient's disorientation and dementia.  Past Medical History:  Diagnosis Date   Bilateral carpal tunnel syndrome 07/27/14   Bipolar disorder (HCC)    geropsych admission to [facility] in Dec 2018   Chronic diastolic CHF (congestive heart failure) (HCC)    a. 03/2017 Echo: EF 55-60%, no rwma, Gr1 DD, mild MR.   Chronic low back pain 07/29/14   Degenerative arthritis of lumbar spine 07/27/14   Dementia (HCC)    Depression    Esophageal spasm 07/29/14   GERD (gastroesophageal reflux disease)    History of stress test    a. 04/2017  MV: low risk stress test.  EF 55-65%. Probable apical ant/apical, basal, and mid inflat attenuation artifact vs ischemia/scar.   Hyperlipemia 07/29/14   Hypertension    Pneumonia    Spinal stenosis 07/29/14    Patient Active Problem List   Diagnosis Date Noted   Dementia (HCC)    HCAP (healthcare-associated pneumonia) 12/18/2017   Bipolar 1 disorder (HCC) 05/29/2017   CHF (congestive heart failure) (HCC) 04/23/2017   Accelerated hypertension 04/21/2017   Severe sepsis (HCC) 10/03/2015   Hypernatremia 10/03/2015   Lactic acidosis 10/03/2015   Leukocytosis 10/03/2015   Agitation 10/03/2015   Severe recurrent major depression without psychotic features (HCC)    Major depressive disorder, recurrent episode, severe, with psychotic behavior (HCC) 10/13/2014   Severe episode of recurrent major depressive disorder, with psychotic features (HCC) 10/13/2014   Major depressive disorder, 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 features (HCC) 10/06/2014   Essential hypertension 09/17/2014   Chronic back pain 09/17/2014   Gastric reflux 09/17/2014   History of stroke 09/17/2014   Severe recurrent major depression with psychotic features (HCC)    Acute on chronic diastolic CHF (congestive heart failure) (HCC) 07/29/2014   Partial small bowel obstruction (HCC) 07/29/2014   Hallux abductovalgus with bunions 09/12/2013   Hammer toe 09/12/2013   Foot pain 09/12/2013   Fungal infection of nail 09/12/2013   Carpal tunnel syndrome 12/21/2010   Chronic low back pain 12/21/2010   Degenerative arthritis of lumbar spine 12/21/2010   Depression 12/21/2010   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 Authorizing Provider  acetaminophen-codeine (TYLENOL #3) 300-30 MG per tablet Take 1 tablet by mouth every 8 (eight) hours as needed for moderate pain or severe pain.   Yes [provider]  cholecalciferol (VITAMIN D) 1000 units tablet Take 1,000 Units by mouth daily.   Yes [provider]  ferrous sulfate 325 (65 FE) MG EC tablet Take 325 mg by mouth daily with breakfast.   Yes [provider]  furosemide (LASIX) 20 MG tablet TAKE 1 TABLET BY MOUTH ONCE DAILY 08/03/20  Yes Alver Sorrow, NP  Lactobacillus Acid-Pectin (ACIDOPHILUS/PECTIN) CAPS Take by mouth.   Yes [provider]  losartan (COZAAR) 25 MG tablet Take 1 tablet (25 mg total) by mouth daily. 09/26/14  Yes Pucilowska, Jolanta B, MD  magnesium oxide (MAG-OX) 400 MG tablet Take 400 mg by mouth daily.   Yes [provider]  metoprolol tartrate (LOPRESSOR) 50 MG tablet Take 1 tablet (50 mg total) by mouth 2 (two) times daily. 08/03/20  Yes Alver Sorrow, NP  mirtazapine (REMERON) 45 MG tablet Take 1 tablet (45 mg total) by mouth at bedtime. 02/10/20  Yes Clapacs, Jackquline Denmark, MD  naloxone [facility]) nasal spray 4 mg/0.1 mL SMARTSIG:1 Spray(s) Both Nares Daily PRN 07/22/20  Yes [provider]  OLANZapine (ZYPREXA) 20 MG tablet Take 1 tablet (20 mg total) by mouth at bedtime. 02/10/20  Yes Clapacs, Jackquline Denmark, MD  omeprazole (PRILOSEC) 20 MG capsule Take 20 mg by mouth 2 (two) times daily before a meal.    Yes [provider]  Probiotic Product (PROBIOTIC DAILY PO) Take by mouth daily.   Yes [provider]  simvastatin (ZOCOR) 40 MG tablet Take 40 mg by mouth at bedtime.    Yes [provider]  venlafaxine XR (EFFEXOR-XR) 150 MG 24 hr capsule 1 po daily 02/10/20  Yes Clapacs, Jackquline Denmark, MD    Allergies Aspirin and Sulfa antibiotics  Family History  Problem Relation Age of Onset   Hypertension Mother    Stroke Mother     Social History Social History    Tobacco Use   Smoking status: Former    Types: Cigarettes    Quit date: 07/29/1975    Years since quitting: 45.4   Smokeless tobacco: Never  Vaping Use   Vaping Use: Never used  Substance Use Topics   Alcohol use: No    Alcohol/week: 0.0 standard drinks   Drug use: No    Review of Systems  Unable to be accurately assessed due to patient's disorientation and dementia. ____________________________________________   PHYSICAL EXAM:  VITAL SIGNS: Vitals:   12/10/20 1936  BP: (!) 116/49  Pulse: 78  Resp: 18  Temp: 97.7 F (36.5 C)  SpO2: 97%     Constitutional: Alert and oriented.  Seems to be working to breathe.  Hard of hearing. Eyes: Conjunctivae are normal. PERRL. EOMI. Head: Atraumatic. Nose: No congestion/rhinnorhea. Mouth/Throat: Mucous membranes are moist.  Oropharynx non-erythematous. Neck: No stridor. No cervical spine tenderness to palpation. Cardiovascular: Normal rate, regular rhythm. Grossly normal heart sounds.  Good peripheral circulation. Respiratory: Tachypneic to the mid 20s.  No retractions.  Decreased breath sounds to the  right base.  No wheezing.. Gastrointestinal: Soft , nondistended. No CVA tenderness. Diffuse tenderness, particularly to the LUQ with some voluntary guarding Musculoskeletal: No lower extremity tenderness nor edema.  No joint effusions. No signs of acute trauma. Neurologic:  Normal speech and language. No gross focal neurologic deficits are appreciated. Skin:  Skin is warm, dry and intact. No rash noted. Psychiatric: Mood and affect are normal. Speech and behavior are normal.  ____________________________________________   LABS (all labs ordered are listed, but only abnormal results are displayed)  Labs Reviewed  COMPREHENSIVE METABOLIC PANEL - Abnormal; Notable for the following components:      Result Value   Glucose, Bld 119 (*)    Calcium 8.5 (*)    Total Protein 6.4 (*)    GFR, Estimated 59 (*)    All other components  within normal limits  CBC - Abnormal; Notable for the following components:   RBC 2.44 (*)    Hemoglobin 9.5 (*)    HCT 28.5 (*)    MCV 116.8 (*)    MCH 38.9 (*)    All other components within normal limits  BRAIN NATRIURETIC PEPTIDE - Abnormal; Notable for the following components:   B Natriuretic Peptide 1,480.5 (*)    All other components within normal limits  RESP PANEL BY RT-PCR (FLU A&B, COVID) ARPGX2  URINALYSIS, COMPLETE (UACMP) WITH MICROSCOPIC   ____________________________________________  12 Lead EKG   ____________________________________________  RADIOLOGY  ED MD interpretation: Plain films reviewed by me with dilated loops of bowel to the left upper quadrant.  Official radiology report(s): DG Abdomen Acute W/Chest  Result Date: 12/10/2020 CLINICAL DATA:  eval pleural effusion/infiltrate on right chest. eval SBO 2/2 LUQ pain EXAM: DG ABDOMEN ACUTE WITH 1 VIEW CHEST COMPARISON:  Chest x-ray 02/28/2009, CT chest abdomen pelvis 12/20/2017 FINDINGS: The heart and mediastinal contours are unchanged. Aortic calcification. Elevated right hemidiaphragm. Right base atelectasis. No focal consolidation. No pulmonary edema. Trace right pleural effusion not excluded. No left pleural effusion. No pneumothorax. There is no evidence of dilated bowel loops or free intraperitoneal air. Surgical clips noted within the abdomen. No radiopaque calculi or other significant radiographic abnormality is seen. Vascular calcifications. No acute osseous abnormality. Severe degenerative changes of the thoracolumbar spine. IMPRESSION: Trace right pleural effusion not excluded. Elevated right hemidiaphragm with right base atelectasis. Nonobstructive bowel gas pattern. Electronically Signed   By: Tish Frederickson M.D.   On: 12/10/2020 23:01    ____________________________________________   PROCEDURES and INTERVENTIONS  Procedure(s) performed (including Critical Care):  .1-3 Lead EKG  Interpretation Performed by: Delton Prairie, MD Authorized by: Delton Prairie, MD     Interpretation: normal     ECG rate:  74   ECG rate assessment: normal     Rhythm: sinus rhythm     Ectopy: none     Conduction: normal    Medications  furosemide (LASIX) injection 60 mg (has no administration in time range)  morphine 2 MG/ML injection 2 mg (has no administration in time range)    ____________________________________________   MDM / ED COURSE   85 year old woman presents from home due to abnormal outpatient CT, with evidence of partial SBO and diastolic CHF exacerbation, requiring medical admission.  Normal vitals on room air.  Though she is noted to be tachypneic with bibasilar crackles.  Blood work with elevated BNP, and with her tachypnea, orthopnea I do suspect a diastolic CHF exacerbation. Exam with LUQ TTP, but no peritonitis. Will admit to medicine  Clinical Course as  of 12/10/20 2314  Fri Dec 10, 2020  2208 Hgb 9.8 on 8/31 [DS]  2224 Educated patient's husband my suspicion that she will need admission to the hospital.  We discussed plan of care [DS]    Clinical Course User Index [DS] Delton Prairie, MD    ____________________________________________   FINAL CLINICAL IMPRESSION(S) / ED DIAGNOSES  Final diagnoses:  SBO (small bowel obstruction) (HCC)  Acute on chronic diastolic congestive heart failure [facility])     ED Discharge Orders     None        Breyer Tejera   Note:  This document was prepared using Dragon voice recognition software and may include unintentional dictation errors.    Delton Prairie, MD 12/10/20 [id number]

## 2020-12-10 NOTE — ED Notes (Signed)
Report given to Tia, RN.

## 2020-12-10 NOTE — H&P (Signed)
History and Physical    Jenna Dudley TDD:[id number] DOB: May 17, 1935 DOA: 12/10/2020  PCP: Toya Smothers, MD   Patient coming from: home  I have personally briefly reviewed patient's old medical records in [facility]  Chief Complaint: Abdominal pain, abnormal CT abdomen  HPI: Jenna Dudley is a 85 y.o. female with medical history significant for Chronic diastolic heart failure, bipolar disorder, HTN, dementia, chronic low back pain, who was sent in by her PCP with an abnormal CT abdomen and pelvis concerning for partial small bowel obstruction after presenting with a complaint of abdominal pain and diarrhea.  Patient continues to have left upper and lower quadrant pain but states that diarrhea has resolved.  Patient endorses short of breath with exertion and at nights with lying flat.  She has had no cough, and no chest pain.  Has had no lower extremity edema.  ED course: On arrival, afebrile, pulse 78 with BP 116/49 and O2 sat 97% on room air Blood work: CBC remarkable for normal WBC, hemoglobin 9.5 which is about her baseline on care everywhere.  CMP mostly unremarkable.  BNP elevated at 1480.  EKG, personally viewed and interpreted: NSR at 74 with no acute ST-T wave changes  Imaging: Acute abdomen with chest: Trace right pleural effusion with nonobstructive bowel gas pattern CT abdomen and pelvis from 9/15 on Care Everywhere: Mildly dilated loop of bowel within the left upper quadrant with transition point within the left mid abdomen, likely physiologic or may possibly related to early obstruction, recommend clinical correlation Moderate right pleural effusion new from 11/24/2020  Patient treated with a dose of Lasix 60 mg.  Hospitalist consulted for admission.  Review of Systems: As per HPI otherwise all other systems on review of systems negative.    Past Medical History:  Diagnosis Date   Bilateral carpal tunnel syndrome 07/27/14   Bipolar disorder (HCC)     geropsych admission to [facility] in Dec 2018   Chronic diastolic CHF (congestive heart failure) (HCC)    a. 03/2017 Echo: EF 55-60%, no rwma, Gr1 DD, mild MR.   Chronic low back pain 07/29/14   Degenerative arthritis of lumbar spine 07/27/14   Dementia (HCC)    Depression    Esophageal spasm 07/29/14   GERD (gastroesophageal reflux disease)    History of stress test    a. 04/2017 MV: low risk stress test.  EF 55-65%. Probable apical ant/apical, basal, and mid inflat attenuation artifact vs ischemia/scar.   Hyperlipemia 07/29/14   Hypertension    Pneumonia    Spinal stenosis 07/29/14    Past Surgical History:  Procedure Laterality Date   HIP SURGERY Right    INNER EAR SURGERY Right    REPLACEMENT TOTAL KNEE BILATERAL Bilateral 07/27/14     reports that she quit smoking about 45 years ago. Her smoking use included cigarettes. She has never used smokeless tobacco. She reports that she does not drink alcohol and does not use drugs.  Allergies  Allergen Reactions   Aspirin Other (See Comments)    Unknown reaction   Sulfa Antibiotics Other (See Comments)    Unknown reaction    Family History  Problem Relation Age of Onset   Hypertension Mother    Stroke Mother       Prior to Admission medications   Medication Sig Start Date End Date Taking? Authorizing Provider  acetaminophen-codeine (TYLENOL #3) 300-30 MG per tablet Take 1 tablet by mouth every 8 (eight) hours as needed for moderate  pain or severe pain.   Yes [provider]  cholecalciferol (VITAMIN D) 1000 units tablet Take 1,000 Units by mouth daily.   Yes [provider]  ferrous sulfate 325 (65 FE) MG EC tablet Take 325 mg by mouth daily with breakfast.   Yes [provider]  furosemide (LASIX) 20 MG tablet TAKE 1 TABLET BY MOUTH ONCE DAILY 08/03/20  Yes Alver Sorrow, NP  Lactobacillus Acid-Pectin (ACIDOPHILUS/PECTIN) CAPS Take by mouth.   Yes [provider]  losartan (COZAAR) 25 MG tablet  Take 1 tablet (25 mg total) by mouth daily. 09/26/14  Yes Pucilowska, Jolanta B, MD  magnesium oxide (MAG-OX) 400 MG tablet Take 400 mg by mouth daily.   Yes [provider]  metoprolol tartrate (LOPRESSOR) 50 MG tablet Take 1 tablet (50 mg total) by mouth 2 (two) times daily. 08/03/20  Yes Alver Sorrow, NP  mirtazapine (REMERON) 45 MG tablet Take 1 tablet (45 mg total) by mouth at bedtime. 02/10/20  Yes Clapacs, Jackquline Denmark, MD  naloxone [facility]) nasal spray 4 mg/0.1 mL SMARTSIG:1 Spray(s) Both Nares Daily PRN 07/22/20  Yes [provider]  OLANZapine (ZYPREXA) 20 MG tablet Take 1 tablet (20 mg total) by mouth at bedtime. 02/10/20  Yes Clapacs, Jackquline Denmark, MD  omeprazole (PRILOSEC) 20 MG capsule Take 20 mg by mouth 2 (two) times daily before a meal.    Yes [provider]  Probiotic Product (PROBIOTIC DAILY PO) Take by mouth daily.   Yes [provider]  simvastatin (ZOCOR) 40 MG tablet Take 40 mg by mouth at bedtime.    Yes [provider]  venlafaxine XR (EFFEXOR-XR) 150 MG 24 hr capsule 1 po daily 02/10/20  Yes Clapacs, Jackquline Denmark, MD    Physical Exam: Vitals:   12/10/20 1936 12/10/20 1938  BP: (!) 116/49   Pulse: 78   Resp: 18   Temp: 97.7 F (36.5 C)   TempSrc: Oral   SpO2: 97%   Weight:  51.7 kg  Height:  5' (1.524 m)     Vitals:   12/10/20 1936 12/10/20 1938  BP: (!) 116/49   Pulse: 78   Resp: 18   Temp: 97.7 F (36.5 C)   TempSrc: Oral   SpO2: 97%   Weight:  51.7 kg  Height:  5' (1.524 m)      Constitutional: Alert and oriented x 3 . Not in any apparent distress HEENT:      Head: Normocephalic and atraumatic.         Eyes: PERLA, EOMI, Conjunctivae are normal. Sclera is non-icteric.       Mouth/Throat: Mucous membranes are moist.       Neck: Supple with no signs of meningismus. Cardiovascular: Regular rate and rhythm. No murmurs, gallops, or rubs. 2+ symmetrical distal pulses are present . No JVD. Mild LE edema Respiratory:  Respiratory effort normal .Lungs sounds clear bilaterally but diminished on right. No wheezes, crackles, or rhonchi.  Gastrointestinal: Soft, tender left lower quadrant non distended with positive bowel sounds.  Genitourinary: No CVA tenderness. Musculoskeletal: Nontender with normal range of motion in all extremities. No cyanosis, or erythema of extremities. Neurologic:  Face is symmetric. Moving all extremities. No gross focal neurologic deficits . Skin: Skin is warm, dry.  No rash or ulcers Psychiatric: Mood and affect are normal    Labs on Admission: I have personally reviewed following labs and imaging studies  CBC: Recent Labs  Lab 12/10/20 1958  WBC 4.5  HGB 9.5*  HCT 28.5*  MCV 116.8*  PLT 217   Basic Metabolic Panel: Recent Labs  Lab 12/10/20 1958  NA 138  K 4.5  CL 105  CO2 26  GLUCOSE 119*  BUN 23  CREATININE 0.96  CALCIUM 8.5*   GFR: Estimated Creatinine Clearance: 31.9 mL/min (by C-G formula based on SCr of 0.96 mg/dL). Liver Function Tests: Recent Labs  Lab 12/10/20 1958  AST 19  ALT 15  ALKPHOS 60  BILITOT 0.7  PROT 6.4*  ALBUMIN 3.7   No results for input(s): LIPASE, AMYLASE in the last 168 hours. No results for input(s): AMMONIA in the last 168 hours. Coagulation Profile: No results for input(s): INR, PROTIME in the last 168 hours. Cardiac Enzymes: No results for input(s): CKTOTAL, CKMB, CKMBINDEX, TROPONINI in the last 168 hours. BNP (last 3 results) No results for input(s): PROBNP in the last 8760 hours. HbA1C: No results for input(s): HGBA1C in the last 72 hours. CBG: No results for input(s): GLUCAP in the last 168 hours. Lipid Profile: No results for input(s): CHOL, HDL, LDLCALC, TRIG, CHOLHDL, LDLDIRECT in the last 72 hours. Thyroid Function Tests: No results for input(s): TSH, T4TOTAL, FREET4, T3FREE, THYROIDAB in the last 72 hours. Anemia Panel: No results for input(s): VITAMINB12, FOLATE, FERRITIN, TIBC, IRON, RETICCTPCT in the  last 72 hours. Urine analysis:    Component Value Date/Time   COLORURINE YELLOW (A) 08/07/2018 0906   APPEARANCEUR HAZY (A) 08/07/2018 0906   APPEARANCEUR Cloudy 03/29/2012 2142   LABSPEC 1.020 08/07/2018 0906   LABSPEC 1.026 03/29/2012 2142   PHURINE 5.0 08/07/2018 0906   GLUCOSEU NEGATIVE 08/07/2018 0906   GLUCOSEU Negative 03/29/2012 2142   HGBUR NEGATIVE 08/07/2018 0906   BILIRUBINUR NEGATIVE 08/07/2018 0906   BILIRUBINUR Negative 03/29/2012 2142   KETONESUR NEGATIVE 08/07/2018 0906   PROTEINUR NEGATIVE 08/07/2018 0906   NITRITE NEGATIVE 08/07/2018 0906   LEUKOCYTESUR MODERATE (A) 08/07/2018 0906   LEUKOCYTESUR 3+ 03/29/2012 2142    Radiological Exams on Admission: DG Abdomen Acute W/Chest  Result Date: 12/10/2020 CLINICAL DATA:  eval pleural effusion/infiltrate on right chest. eval SBO 2/2 LUQ pain EXAM: DG ABDOMEN ACUTE WITH 1 VIEW CHEST COMPARISON:  Chest x-ray 02/28/2009, CT chest abdomen pelvis 12/20/2017 FINDINGS: The heart and mediastinal contours are unchanged. Aortic calcification. Elevated right hemidiaphragm. Right base atelectasis. No focal consolidation. No pulmonary edema. Trace right pleural effusion not excluded. No left pleural effusion. No pneumothorax. There is no evidence of dilated bowel loops or free intraperitoneal air. Surgical clips noted within the abdomen. No radiopaque calculi or other significant radiographic abnormality is seen. Vascular calcifications. No acute osseous abnormality. Severe degenerative changes of the thoracolumbar spine. IMPRESSION: Trace right pleural effusion not excluded. Elevated right hemidiaphragm with right base atelectasis. Nonobstructive bowel gas pattern. Electronically Signed   By: Tish Frederickson M.D.   On: 12/10/2020 23:01     Assessment/Plan 85 year old female with history of chronic diastolic heart failure, bipolar disorder, HTN, dementia, chronic low back pain, who was sent in by her PCP with an abnormal CT abdomen and  pelvis concerning for partial small bowel obstruction after presenting with a complaint of abdominal pain and diarrhea and shortness of breath with laying flat.  Additional CT findings include moderate right pleural effusion patient    Partial small bowel obstruction (HCC) - Acute abdomen series on 9/16 shows normal bowel gas pattern however CT abdomen/pelvis from 9/15 showsMildly dilated loop of bowel within the left upper quadrant with transition point within the left  mid abdomen, likely physiologic or may possibly related to early obstruction - N.p.o. tonight except ice chips - Surgical consult    Acute on chronic diastolic CHF  Right pleural effusion - CT abdomen pelvis from 9/15 shows moderate right pleural effusion.  BNP over 1400 -Continue IV Lasix - Continue home metoprolol and losartan - Daily weights with intake and output monitoring - Echocardiogram  Chronic Anemia -Hemoglobin 9.5, at baseline of 9.8 on Care Everywhere   Essential hypertension - Continue metoprolol and losartan    Chronic low back pain - Continue Tylenol 3 as needed    Depression - Continue venlafaxine, Zyprexa and Remeron    Dementia (HCC) - Delirium precautions   DVT prophylaxis: Lovenox  Code Status: full code  Family Communication:  none  Disposition Plan: Back to previous home environment Consults called: Surgery Status:At the time of admission, it appears that the appropriate admission status for this patient is INPATIENT. This is judged to be reasonable and necessary in order to provide the required intensity of service to ensure the patient's safety given the presenting symptoms, physical exam findings, and initial radiographic and laboratory data in the context of their  Comorbid conditions.   Patient requires inpatient status due to high intensity of service, high risk for further deterioration and high frequency of surveillance required.   I certify that at the point of admission it is my  clinical judgment that the patient will require inpatient hospital care spanning beyond 2 midnights     Andris Baumann MD [facility]     12/10/2020, 11:23 PM

## 2020-12-10 NOTE — ED Provider Notes (Signed)
[facility] Emergency Department Provider Note ____________________________________________   Event Date/Time   First MD Initiated Contact with Patient 12/10/20 2202     (approximate)  I have reviewed the triage vital signs and the nursing notes.  HISTORY  Chief Complaint Abdominal Pain   HPI Jenna Dudley is a 85 y.o. femalewho presents to the ED for evaluation of abd pain and abnormal outpatient CT  Chart review indicates patient had an outpatient CT abdomen/pelvis with contrast performed yesterday, 9/15.  I can see these written results, but cannot see any images, which demonstrate mildly dilated loops of bowel to the left lower quadrant with a transition point within the left lower abdomen, possibly representing early SBO.  Moderate right-sided pleural effusion.  These results, she was referred to the ED for evaluation.  Has a history of diastolic dysfunction, dementia, HTN and HLD. She lives at home with her husband.  Husband provides majority of history due to patient's disorientation and dementia.  They report that for the past 3-4 days she has had increasing shortness of breath, particularly while lying flat at night while in bed.  Further reports watery diarrhea.  They report that she has had abdominal pain, particularly when you press on her belly.  No emesis, fevers, chest pain or syncope.  History somewhat limited due to patient's disorientation and dementia.  Past Medical History:  Diagnosis Date   Bilateral carpal tunnel syndrome 07/27/14   Bipolar disorder (HCC)    geropsych admission to [facility] in Dec 2018   Chronic diastolic CHF (congestive heart failure) (HCC)    a. 03/2017 Echo: EF 55-60%, no rwma, Gr1 DD, mild MR.   Chronic low back pain 07/29/14   Degenerative arthritis of lumbar spine 07/27/14   Dementia (HCC)    Depression    Esophageal spasm 07/29/14   GERD (gastroesophageal reflux disease)    History of stress test    a. 04/2017  MV: low risk stress test.  EF 55-65%. Probable apical ant/apical, basal, and mid inflat attenuation artifact vs ischemia/scar.   Hyperlipemia 07/29/14   Hypertension    Pneumonia    Spinal stenosis 07/29/14    Patient Active Problem List   Diagnosis Date Noted   HCAP (healthcare-associated pneumonia) 12/18/2017   Bipolar 1 disorder (HCC) 05/29/2017   CHF (congestive heart failure) (HCC) 04/23/2017   Accelerated hypertension 04/21/2017   Severe sepsis (HCC) 10/03/2015   Hypernatremia 10/03/2015   Lactic acidosis 10/03/2015   Leukocytosis 10/03/2015   Agitation 10/03/2015   Severe recurrent major depression without psychotic features (HCC)    Major depressive disorder, recurrent episode, severe, with psychotic behavior (HCC) 10/13/2014   Severe episode of recurrent major depressive disorder, with psychotic features (HCC) 10/13/2014   Major depressive disorder, 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 features (HCC) 10/06/2014   Essential hypertension 09/17/2014   Chronic back pain 09/17/2014   Gastric reflux 09/17/2014   History of stroke 09/17/2014   Severe recurrent major depression with psychotic features (HCC)    Hallux abductovalgus with bunions 09/12/2013   Hammer toe 09/12/2013   Foot pain 09/12/2013   Fungal infection of nail 09/12/2013   Carpal tunnel syndrome 12/21/2010   Chronic LBP 12/21/2010   Degenerative arthritis of lumbar spine 12/21/2010   Clinical depression 12/21/2010   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 Authorizing Provider  acetaminophen-codeine (TYLENOL #3) 300-30 MG per tablet Take 1 tablet by mouth every 8 (eight) hours as needed for  moderate pain or severe pain.   Yes [provider]  cholecalciferol (VITAMIN D) 1000 units tablet Take 1,000 Units by mouth daily.   Yes [provider]  ferrous sulfate 325 (65 FE) MG EC tablet Take 325 mg by mouth daily with breakfast.   Yes [provider]  furosemide (LASIX) 20 MG tablet TAKE 1 TABLET BY MOUTH ONCE DAILY 08/03/20  Yes Alver Sorrow, NP  Lactobacillus Acid-Pectin (ACIDOPHILUS/PECTIN) CAPS Take by mouth.   Yes [provider]  losartan (COZAAR) 25 MG tablet Take 1 tablet (25 mg total) by mouth daily. 09/26/14  Yes Pucilowska, Jolanta B, MD  magnesium oxide (MAG-OX) 400 MG tablet Take 400 mg by mouth daily.   Yes [provider]  metoprolol tartrate (LOPRESSOR) 50 MG tablet Take 1 tablet (50 mg total) by mouth 2 (two) times daily. 08/03/20  Yes Alver Sorrow, NP  mirtazapine (REMERON) 45 MG tablet Take 1 tablet (45 mg total) by mouth at bedtime. 02/10/20  Yes Clapacs, Jackquline Denmark, MD  naloxone [facility]) nasal spray 4 mg/0.1 mL SMARTSIG:1 Spray(s) Both Nares Daily PRN 07/22/20  Yes [provider]  OLANZapine (ZYPREXA) 20 MG tablet Take 1 tablet (20 mg total) by mouth at bedtime. 02/10/20  Yes Clapacs, Jackquline Denmark, MD  omeprazole (PRILOSEC) 20 MG capsule Take 20 mg by mouth 2 (two) times daily before a meal.    Yes [provider]  Probiotic Product (PROBIOTIC DAILY PO) Take by mouth daily.   Yes [provider]  simvastatin (ZOCOR) 40 MG tablet Take 40 mg by mouth at bedtime.    Yes [provider]  venlafaxine XR (EFFEXOR-XR) 150 MG 24 hr capsule 1 po daily 02/10/20  Yes Clapacs, Jackquline Denmark, MD    Allergies Aspirin and Sulfa antibiotics  Family History  Problem Relation Age of Onset   Hypertension Mother    Stroke Mother     Social History Social History   Tobacco Use   Smoking status: Former    Types: Cigarettes    Quit date: 07/29/1975    Years since quitting: 45.4   Smokeless tobacco: Never   Vaping Use   Vaping Use: Never used  Substance Use Topics   Alcohol use: No    Alcohol/week: 0.0 standard drinks   Drug use: No    Review of Systems  Unable to be accurately assessed due to patient's disorientation and dementia. ____________________________________________   PHYSICAL EXAM:  VITAL SIGNS: Vitals:   12/10/20 1936  BP: (!) 116/49  Pulse: 78  Resp: 18  Temp: 97.7 F (36.5 C)  SpO2: 97%     Constitutional: Alert and oriented.  Seems to be working to breathe.  Hard of hearing. Eyes: Conjunctivae are normal. PERRL. EOMI. Head: Atraumatic. Nose: No congestion/rhinnorhea. Mouth/Throat: Mucous membranes are moist.  Oropharynx non-erythematous. Neck: No stridor. No cervical spine tenderness to palpation. Cardiovascular: Normal rate, regular rhythm. Grossly normal heart sounds.  Good peripheral circulation. Respiratory: Tachypneic to the mid 20s.  No retractions.  Decreased breath sounds to the right base.  No wheezing.. Gastrointestinal: Soft , nondistended. No CVA tenderness. Diffuse tenderness, particularly to the LUQ with some voluntary guarding Musculoskeletal: No lower extremity  tenderness nor edema.  No joint effusions. No signs of acute trauma. Neurologic:  Normal speech and language. No gross focal neurologic deficits are appreciated. Skin:  Skin is warm, dry and intact. No rash noted. Psychiatric: Mood and affect are normal. Speech and behavior are normal.  ____________________________________________   LABS (all labs ordered are listed, but only abnormal results are displayed)  Labs Reviewed  COMPREHENSIVE METABOLIC PANEL - Abnormal; Notable for the following components:      Result Value   Glucose, Bld 119 (*)    Calcium 8.5 (*)    Total Protein 6.4 (*)    GFR, Estimated 59 (*)    All other components within normal limits  CBC - Abnormal; Notable for the following components:   RBC 2.44 (*)    Hemoglobin 9.5 (*)    HCT 28.5 (*)    MCV 116.8  (*)    MCH 38.9 (*)    All other components within normal limits  BRAIN NATRIURETIC PEPTIDE - Abnormal; Notable for the following components:   B Natriuretic Peptide 1,480.5 (*)    All other components within normal limits  RESP PANEL BY RT-PCR (FLU A&B, COVID) ARPGX2  URINALYSIS, COMPLETE (UACMP) WITH MICROSCOPIC   ____________________________________________  12 Lead EKG   ____________________________________________  RADIOLOGY  ED MD interpretation: Acute abdominal series reviewed by me with trace right-sided pleural effusion, pulmonary vascular congestion, dilated loops of bowel within the left upper quadrant without air-fluid levels.  Official radiology report(s): DG Abdomen Acute W/Chest  Result Date: 12/10/2020 CLINICAL DATA:  eval pleural effusion/infiltrate on right chest. eval SBO 2/2 LUQ pain EXAM: DG ABDOMEN ACUTE WITH 1 VIEW CHEST COMPARISON:  Chest x-ray 02/28/2009, CT chest abdomen pelvis 12/20/2017 FINDINGS: The heart and mediastinal contours are unchanged. Aortic calcification. Elevated right hemidiaphragm. Right base atelectasis. No focal consolidation. No pulmonary edema. Trace right pleural effusion not excluded. No left pleural effusion. No pneumothorax. There is no evidence of dilated bowel loops or free intraperitoneal air. Surgical clips noted within the abdomen. No radiopaque calculi or other significant radiographic abnormality is seen. Vascular calcifications. No acute osseous abnormality. Severe degenerative changes of the thoracolumbar spine. IMPRESSION: Trace right pleural effusion not excluded. Elevated right hemidiaphragm with right base atelectasis. Nonobstructive bowel gas pattern. Electronically Signed   By: Tish Frederickson M.D.   On: 12/10/2020 23:01    ____________________________________________   PROCEDURES and INTERVENTIONS  Procedure(s) performed (including Critical Care):  .1-3 Lead EKG Interpretation Performed by: Delton Prairie,  MD Authorized by: Delton Prairie, MD     Interpretation: normal     ECG rate:  74   ECG rate assessment: normal     Rhythm: sinus rhythm     Ectopy: none     Conduction: normal    Medications  furosemide (LASIX) injection 60 mg (has no administration in time range)  morphine 2 MG/ML injection 2 mg (has no administration in time range)    ____________________________________________   MDM / ED COURSE   85 year old woman presents from home after having had abnormal outpatient CT scan, with evidence of a partial SBO and CHF exacerbation requiring medical admission.  Normal vitals on room air, though tachypnea is noted.  Bibasilar crackles are noted, but no wheezing.  He is quite tender in the abdomen, particularly to the LUQ.  I reviewed written CT results from yesterday, and obtain a acute abdominal series here today that does show dilated loops of bowel to the left upper quadrant, but no air-fluid levels or  signs of free air.  Her BNP is quite elevated, and with her orthopnea and dyspnea I suspect CHF exacerbation.  Macrocytic anemia around baseline, renal function intact.  We will discuss with medicine for admission.  Clinical Course as of 12/10/20 2309  Fri Dec 10, 2020  2208 Hgb 9.8 on 8/31 [DS]  2224 Educated patient's husband my suspicion that she will need admission to the hospital.  We discussed plan of care [DS]    Clinical Course User Index [DS] Delton Prairie, MD    ____________________________________________   FINAL CLINICAL IMPRESSION(S) / ED DIAGNOSES  Final diagnoses:  SBO (small bowel obstruction) (HCC)  Acute on chronic diastolic congestive heart failure [facility])     ED Discharge Orders     None        Jacob Cicero   Note:  This document was prepared using Dragon voice recognition software and may include unintentional dictation errors.    Delton Prairie, MD 12/10/20 [phone]

## 2020-12-11 ENCOUNTER — Inpatient Hospital Stay (HOSPITAL_COMMUNITY)
Admit: 2020-12-11 | Discharge: 2020-12-11 | Disposition: A | Discharge status: Home / Self Care | Payer: Medicare Other | Attending: Internal Medicine | Admitting: Internal Medicine

## 2020-12-11 ENCOUNTER — Inpatient Hospital Stay: Payer: Medicare Other

## 2020-12-11 ENCOUNTER — Encounter: Payer: Self-pay | Admitting: Internal Medicine

## 2020-12-11 DIAGNOSIS — I5031 Acute diastolic (congestive) heart failure: Secondary | ICD-10-CM | POA: Diagnosis not present

## 2020-12-11 DIAGNOSIS — I10 Essential (primary) hypertension: Secondary | ICD-10-CM

## 2020-12-11 DIAGNOSIS — I5033 Acute on chronic diastolic (congestive) heart failure: Secondary | ICD-10-CM

## 2020-12-11 LAB — URINALYSIS, COMPLETE (UACMP) WITH MICROSCOPIC
Bilirubin Urine: NEGATIVE
Glucose, UA: NEGATIVE mg/dL
Hgb urine dipstick: NEGATIVE
Ketones, ur: NEGATIVE mg/dL
Leukocytes,Ua: NEGATIVE
Nitrite: NEGATIVE
Protein, ur: NEGATIVE mg/dL
Specific Gravity, Urine: 1.015 (ref 1.005–1.030)
Squamous Epithelial / HPF: NONE SEEN (ref 0–5)
pH: 6 (ref 5.0–8.0)

## 2020-12-11 LAB — ECHOCARDIOGRAM COMPLETE
AR max vel: 0.81 cm2
AV Peak grad: 14.5 mmHg
Ao pk vel: 1.91 m/s
Area-P 1/2: 4.36 cm2
Height: 60 in
S' Lateral: 2.5 cm
Weight: 1824 oz

## 2020-12-11 MED ORDER — POLYETHYLENE GLYCOL 3350 17 G PO PACK
17.0000 g | PACK | Freq: Every day | ORAL | Status: DC
  Administered 2020-12-11 – 2020-12-15 (×5): 17 g via ORAL
  Filled 2020-12-11 (×5): qty 1

## 2020-12-11 MED ORDER — BISACODYL 5 MG PO TBEC
10.0000 mg | DELAYED_RELEASE_TABLET | Freq: Two times a day (BID) | ORAL | Status: DC
  Administered 2020-12-11 – 2020-12-12 (×3): 10 mg via ORAL
  Filled 2020-12-11 (×3): qty 2

## 2020-12-11 MED ORDER — IOHEXOL 350 MG/ML SOLN
75.0000 mL | Freq: Once | INTRAVENOUS | Status: AC | PRN
  Administered 2020-12-11: 75 mL via INTRAVENOUS

## 2020-12-11 MED ORDER — BISACODYL 10 MG RE SUPP: 10.0000 mg | Freq: Two times a day (BID) | RECTAL | Status: DC

## 2020-12-11 MED ORDER — VITAMIN B-12 1000 MCG PO TABS
500.0000 ug | ORAL_TABLET | Freq: Every day | ORAL | Status: DC
  Administered 2020-12-12 – 2020-12-17 (×6): 500 ug via ORAL
  Filled 2020-12-11 (×8): qty 1

## 2020-12-11 MED ORDER — MINERAL OIL RE ENEM: 1.0000 | ENEMA | Freq: Once | RECTAL | Status: DC

## 2020-12-11 NOTE — ED Notes (Signed)
Patient refusing enema at this time. Husband at bedside attempting to help encourage patient.

## 2020-12-11 NOTE — ED Notes (Signed)
Pt lying in bed, lights down and eyes closed

## 2020-12-11 NOTE — ED Notes (Signed)
Patient found in room in bed with monitor leads and oxygen not on with 02 sat 87%. Placed back on 2L [place] and sats up to 97%. Patient placed back on cardiac monitor and continuous pulse ox.

## 2020-12-11 NOTE — ED Notes (Signed)
Tele Sitter activated at this time.

## 2020-12-11 NOTE — ED Notes (Signed)
Patient given phone and dialed husband at this time.

## 2020-12-11 NOTE — Progress Notes (Signed)
[facility] Progress Note  Patient: Jenna Dudley    Jenna Dudley:[id number]  DOA: 12/10/2020     Date of Service: the patient was seen and examined on 12/11/2020  Chief Complaint  Patient presents with   Abdominal Pain   Brief hospital course: Jenna Dudley is a 85 y.o. female with medical history significant for Chronic diastolic heart failure, bipolar disorder, HTN, dementia, chronic low back pain, who was sent in by her PCP with an abnormal CT abdomen and pelvis concerning for partial small bowel obstruction after presenting with a complaint of abdominal pain and diarrhea.  Patient continues to have left upper and lower quadrant pain but states that diarrhea has resolved.  Patient endorses short of breath with exertion and at nights with lying flat.  She has had no cough, and no chest pain.  Has had no lower extremity edema.   ED course: On arrival, afebrile, pulse 78 with BP 116/49 and O2 sat 97% on room air Blood work: CBC remarkable for normal WBC, hemoglobin 9.5 which is about her baseline on care everywhere.  CMP mostly unremarkable.  BNP elevated at 1480.   EKG, personally viewed and interpreted: NSR at 74 with no acute ST-T wave changes   Imaging: Acute abdomen with chest: Trace right pleural effusion with nonobstructive bowel gas pattern CT abdomen and pelvis from 9/15 on Care Everywhere: Mildly dilated loop of bowel within the left upper quadrant with transition point within the left mid abdomen, likely physiologic or may possibly related to early obstruction, recommend clinical correlation Moderate right pleural effusion new from 11/24/2020   Patient treated with a dose of Lasix 60 mg.  Hospitalist consulted for admission.   Assessment and Plan:   # Partial small bowel obstruction, resolved - Acute abdomen series on 9/16 shows normal bowel gas pattern however CT abdomen/pelvis from 9/15 showsMildly dilated loop of bowel within the left upper quadrant with transition point within  the left mid abdomen, likely physiologic or may possibly related to early obstruction 9/17 CT a/p: 1. Moderate intra and extrahepatic biliary ductal dilatation is increased since 12/20/2017. No focal lesion or stone is seen. This could be further evaluated with MRI/MRCP (this could be performed on an outpatient basis depending on level of clinical concern). 2. Moderate colonic stool burden without evidence of mechanical obstruction. 3. Moderate-size right pleural effusion. Consider dedicated imaging of the chest as indicated. No surgical intervention needed so general surgery consult was consulted Started laxatives for constipation, mineral oil enema x1 ordered Resumed diet   # Acute Urine retention, POA, active  bladder scan 369 mL, Foley catheter was inserted Continue Foley catheter for 7 days and then follow voiding trial and follow with urology as an outpatient.  # Acute on chronic diastolic CHF  Right pleural effusion - CT abdomen pelvis from 9/15 shows moderate right pleural effusion.  BNP over 1400 -s/p IV Lasix 60 mg x 1 dose - Continue home metoprolol and losartan - Daily weights with intake and output monitoring - Echocardiogram Patient will benefit from thoracentesis, consider ultrasound-guided thoracentesis on Monday Patient was seen by cardiology, recommended patient is not volume overload, may be little bit dry so hold off IV Lasix for diuresis.  Patient may need hydration if patient remains n.p.o.   Chronic Anemia -Hemoglobin 9.5, at baseline of 9.8 on Care Everywhere    Essential hypertension - Continue metoprolol and losartan     Chronic low back pain - Continue Tylenol 3 as needed  Depression - Continue venlafaxine, Zyprexa and Remeron     Dementia  - Delirium precautions    Body mass index is 22.26 kg/m.  Interventions:       Diet: Heart healthy DVT Prophylaxis: Subcutaneous Lovenox   Advance goals of care discussion: Full code  Family  Communication: family was NOT present at bedside, at the time of interview.  The pt provided permission to discuss medical plan with the family. Opportunity was given to ask question and all questions were answered satisfactorily.   Disposition:  Pt is from Home??, admitted with abdominal pain and shortness of breath, still has abdominal pain and shortness of breath, which precludes a safe discharge. Discharge to home, when medically stable, may need 2 to 3 days.  Subjective: No significant overnight events, patient still has mild shortness of breath and abdominal discomfort, patient very poor historian, AAO x2, most of the problems reviewed from HPI   Physical Exam: General:  alert oriented to place and person.  Appear in mild distress, affect appropriate Eyes: PERRLA ENT: Oral Mucosa Clear, moist  Neck: no JVD,  Cardiovascular: S1 and S2 Present,  Murmur audible Respiratory: good respiratory effort, Bilateral Air entry equal and Decreased, bibasilar crackles, decreased breath sounds right lower lobe, no wheezes Abdomen: Bowel Sound present, Soft and mild generalized tenderness,  Skin: No rashes Extremities: no  Pedal edema, no calf tenderness Neurologic: without any new focal findings Gait not checked due to patient safety concerns  Vitals:   12/11/20 1100 12/11/20 1200 12/11/20 1230 12/11/20 1534  BP: 138/73 (!) 122/58 128/72 130/63  Pulse: 70 70 71 70  Resp: 11 17 19 18   Temp:      TempSrc:      SpO2: 100% 100% 100% 100%  Weight:      Height:       No intake or output data in the 24 hours ending 12/11/20 1627 Filed Weights   12/10/20 1938  Weight: 51.7 kg    Data Reviewed: I have personally reviewed and interpreted daily labs, tele strips, imagings as discussed above. I reviewed all nursing notes, pharmacy notes, vitals, pertinent old records I have discussed plan of care as described above with RN and patient/family.  CBC: Recent Labs  Lab 12/10/20 1958  WBC 4.5   HGB 9.5*  HCT 28.5*  MCV 116.8*  PLT 217   Basic Metabolic Panel: Recent Labs  Lab 12/10/20 1958  NA 138  K 4.5  CL 105  CO2 26  GLUCOSE 119*  BUN 23  CREATININE 0.96  CALCIUM 8.5*    Studies: CT ABDOMEN PELVIS W CONTRAST  Result Date: 12/11/2020 CLINICAL DATA:  Abdominal distention EXAM: CT ABDOMEN AND PELVIS WITH CONTRAST TECHNIQUE: Multidetector CT imaging of the abdomen and pelvis was performed using the standard protocol following bolus administration of intravenous contrast. CONTRAST:  17mL OMNIPAQUE IOHEXOL 350 MG/ML SOLN COMPARISON:  CT abdomen/pelvis 12/20/2017 FINDINGS: Lower chest: There is a moderate size right pleural effusion with adjacent consolidation. The heart is mildly enlarged. Coronary artery calcifications, aortic valve calcifications, and mitral annular calcifications are noted. There is no pericardial effusion. Hepatobiliary: The liver is heterogeneously enhancing on the early phase images but homogeneous on the delayed phase, suggesting perfusional alterations. There are no focal parenchymal lesions. The gallbladder is surgically absent. There is new moderate intra and extrahepatic biliary ductal dilatation with the common bile duct measuring up to 1.1 cm. No radiopaque stone or other obstructing lesion is seen. Pancreas: The pancreas is atrophic but  otherwise unremarkable. There is no focal lesion or contour abnormality. There is no main pancreatic ductal dilatation or peripancreatic inflammatory change. Spleen: Unremarkable. Adrenals/Urinary Tract: The adrenals are mildly thickened bilaterally with no focal lesion. The kidneys enhance symmetrically with symmetric excretion of contrast into the collecting system on the delayed phase images. There is no focal lesion or stone. There is no hydronephrosis or hydroureter. There is mild perinephric stranding bilaterally, nonspecific. The bladder is decompressed by a Foley catheter. Stomach/Bowel: There is a small hiatal  hernia. The stomach is otherwise unremarkable. There is no evidence of bowel obstruction. There is a moderate stool burden throughout the colon. There is no abnormal bowel wall thickening or inflammatory change. Vascular/Lymphatic: There is extensive calcified atherosclerotic plaque throughout the nonaneurysmal abdominal aorta. The major branch vessels are patent. The main portal and splenic veins are patent. Reproductive: The uterus is surgically absent. There is no adnexal mass. Other: There is trace free fluid in the presacral space, nonspecific. There is no organized or drainable fluid collection. There is no free air. Surgical clips are noted along the right ventral abdominal wall. Musculoskeletal: There is a subacute fracture of the right L2 transverse process, new since 12/20/2017. There is advanced multilevel degenerative change throughout the spine and of the hips, right worse than left. There is mild compression deformity of the L1 vertebral body, similar to the prior CT. IMPRESSION: 1. Moderate intra and extrahepatic biliary ductal dilatation is increased since 12/20/2017. No focal lesion or stone is seen. This could be further evaluated with MRI/MRCP (this could be performed on an outpatient basis depending on level of clinical concern). 2. Moderate colonic stool burden without evidence of mechanical obstruction. 3. Moderate-size right pleural effusion. Consider dedicated imaging of the chest as indicated. 4. Mild cardiomegaly. Dense coronary artery calcifications, aortic valve calcifications, and mild mitral annular calcifications. 5. Trace free fluid in the presacral space, nonspecific. 6. Subacute appearing fracture of the right L2 transverse process. Aortic Atherosclerosis (ICD10-I70.0). Electronically Signed   By: Lesia Hausen M.D.   On: 12/11/2020 13:54   DG Abdomen Acute W/Chest  Result Date: 12/10/2020 CLINICAL DATA:  eval pleural effusion/infiltrate on right chest. eval SBO 2/2 LUQ pain  EXAM: DG ABDOMEN ACUTE WITH 1 VIEW CHEST COMPARISON:  Chest x-ray 02/28/2009, CT chest abdomen pelvis 12/20/2017 FINDINGS: The heart and mediastinal contours are unchanged. Aortic calcification. Elevated right hemidiaphragm. Right base atelectasis. No focal consolidation. No pulmonary edema. Trace right pleural effusion not excluded. No left pleural effusion. No pneumothorax. There is no evidence of dilated bowel loops or free intraperitoneal air. Surgical clips noted within the abdomen. No radiopaque calculi or other significant radiographic abnormality is seen. Vascular calcifications. No acute osseous abnormality. Severe degenerative changes of the thoracolumbar spine. IMPRESSION: Trace right pleural effusion not excluded. Elevated right hemidiaphragm with right base atelectasis. Nonobstructive bowel gas pattern. Electronically Signed   By: Tish Frederickson M.D.   On: 12/10/2020 23:01   ECHOCARDIOGRAM COMPLETE  Result Date: 12/11/2020    ECHOCARDIOGRAM REPORT   Patient Name:   EDYNN GILLOCK Date of Exam: 12/11/2020 Medical Rec #:  [id number]     Height:       60.0 in Accession #:    [id number]    Weight:       114.0 lb Date of Birth:  [birth year]     BSA:          1.470 m Patient Age:    85 years      BP:  110/50 mmHg Patient Gender: F             HR:           59 bpm. Exam Location:  [facility] Procedure: 2D Echo, Cardiac Doppler and Color Doppler Indications:     CHF-Acute Diastolic I50.31  History:         Patient has prior history of Echocardiogram examinations. CHF;                  Risk Factors:Hypertension.  Sonographer:     Neysa Bonito Roar Referring Phys:  [id number] Andris Baumann Diagnosing Phys: Julien Nordmann MD IMPRESSIONS  1. Left ventricular ejection fraction, by estimation, is 60 to 65%. The left ventricle has normal function. The left ventricle has no regional wall motion abnormalities. There is mmoderate to severe left ventricular hypertrophy with near cavity obliteration in systole. Left  ventricular diastolic parameters are consistent with Grade III diastolic dysfunction (restrictive).  2. Right ventricular systolic function is normal. The right ventricular size is normal. There is severely elevated pulmonary artery systolic pressure. The estimated right ventricular systolic pressure is 61.9 mmHg.  3. Left atrial size was mild to moderately dilated.  4. The mitral valve is normal in structure. Mild to moderate mitral valve regurgitation.  5. Tricuspid valve regurgitation is moderate.  6. The inferior vena cava is dilated in size with >50% respiratory variability, suggesting right atrial pressure of 8 mmHg. FINDINGS  Left Ventricle: Left ventricular ejection fraction, by estimation, is 60 to 65%. The left ventricle has normal function. The left ventricle has no regional wall motion abnormalities. The left ventricular internal cavity size was normal in size. There is  severe left ventricular hypertrophy. Left ventricular diastolic parameters are consistent with Grade III diastolic dysfunction (restrictive). Right Ventricle: The right ventricular size is normal. No increase in right ventricular wall thickness. Right ventricular systolic function is normal. There is severely elevated pulmonary artery systolic pressure. The tricuspid regurgitant velocity is 3.77 m/s, and with an assumed right atrial pressure of 5 mmHg, the estimated right ventricular systolic pressure is 61.9 mmHg. Left Atrium: Left atrial size was mild to moderately dilated. Right Atrium: Right atrial size was normal in size. Pericardium: There is no evidence of pericardial effusion. Mitral Valve: The mitral valve is normal in structure. Mild to moderate mitral valve regurgitation. No evidence of mitral valve stenosis. Tricuspid Valve: The tricuspid valve is normal in structure. Tricuspid valve regurgitation is moderate . No evidence of tricuspid stenosis. Aortic Valve: The aortic valve is normal in structure. Aortic valve regurgitation  is not visualized. Mild aortic valve sclerosis is present, with no evidence of aortic valve stenosis. Aortic valve peak gradient measures 14.5 mmHg. Pulmonic Valve: The pulmonic valve was normal in structure. Pulmonic valve regurgitation is not visualized. No evidence of pulmonic stenosis. Aorta: The aortic root is normal in size and structure. Venous: The inferior vena cava is dilated in size with greater than 50% respiratory variability, suggesting right atrial pressure of 8 mmHg. IAS/Shunts: No atrial level shunt detected by color flow Doppler.  LEFT VENTRICLE PLAX 2D LVIDd:         3.70 cm  Diastology LVIDs:         2.50 cm  LV e' medial:    4.35 cm/s LV PW:         1.30 cm  LV E/e' medial:  30.1 LV IVS:        1.90 cm  LV e' lateral:   3.83 cm/s  LVOT diam:     1.50 cm  LV E/e' lateral: 34.2 LVOT Area:     1.77 cm  RIGHT VENTRICLE RV Basal diam:  2.60 cm RV Mid diam:    2.30 cm RV S prime:     9.60 cm/s TAPSE (M-mode): 1.7 cm LEFT ATRIUM             Index       RIGHT ATRIUM           Index LA diam:        4.50 cm 3.06 cm/m  RA Area:     15.10 cm LA Vol (A2C):   81.6 ml 55.51 ml/m RA Volume:   37.00 ml  25.17 ml/m LA Vol (A4C):   72.3 ml 49.19 ml/m LA Biplane Vol: 77.6 ml 52.79 ml/m  AORTIC VALVE                PULMONIC VALVE AV Area (Vmax): 0.81 cm    PV Vmax:        0.91 m/s AV Vmax:        190.50 cm/s PV Peak grad:   3.3 mmHg AV Peak Grad:   14.5 mmHg   RVOT Peak grad: 2 mmHg LVOT Vmax:      87.30 cm/s  AORTA Ao Root diam: 2.40 cm MITRAL VALVE                TRICUSPID VALVE MV Area (PHT): 4.36 cm     TR Peak grad:   56.9 mmHg MV Decel Time: 174 msec     TR Vmax:        377.00 cm/s MV E velocity: 131.00 cm/s MV A velocity: 72.30 cm/s   SHUNTS MV E/A ratio:  1.81         Systemic Diam: 1.50 cm MV A Prime:    4.0 cm/s Julien Nordmann MD Electronically signed by Julien Nordmann MD Signature Date/Time: 12/11/2020/3:06:26 PM    Final     Scheduled Meds:  bisacodyl  10 mg Oral BID   enoxaparin (LOVENOX)  injection  40 mg Subcutaneous Q24H   losartan  25 mg Oral Daily   metoprolol tartrate  50 mg Oral BID   mirtazapine  45 mg Oral QHS   OLANZapine  20 mg Oral QHS   pantoprazole  40 mg Oral Daily   polyethylene glycol  17 g Oral Daily   simvastatin  40 mg Oral QHS   venlafaxine XR  150 mg Oral Q breakfast   vitamin B-12  500 mcg Oral Daily   Continuous Infusions: PRN Meds: acetaminophen **OR** acetaminophen, acetaminophen-codeine, ondansetron **OR** ondansetron (ZOFRAN) IV  Time spent: 35 minutes  Author: Gillis Santa. MD [facility] 12/11/2020 4:27 PM  To reach On-call, see care teams to locate the attending and reach out to them via [email or web address]. If 7PM-7AM, please contact night-coverage If you still have difficulty reaching the attending provider, please page the [facility] (Director on Call) for [facility] on amion for assistance.

## 2020-12-11 NOTE — Consult Note (Signed)
Cardiology Consultation:   Patient ID: Jenna Dudley MRN: [id number]; DOB: 05-05-1935  Admit date: 12/10/2020 Date of Consult: 12/11/2020  PCP:  Toya Smothers, MD   [facility] Providers Cardiologist:  Lorine Bears, MD }     Patient Profile:   Jenna Dudley is a 85 y.o. female with a hx of chronic diastolic CHF, bipolar disorder (requiring ECT), HTN, HL, GERD, early dementia who is being seen 12/11/2020 for the evaluation of CHF at the request of Dr Lucianne Muss .  History of Present Illness:   Jenna Dudley is an 85yo with hx of chronic diasotlic CHF, bipolar disorder, HTN, HL, sinus bradycardia, GERD, early dementia   Followed by Terrilee Files in clinic  Echo in 2021 LVEF 55 t o60%   Last seen by Brunetta Genera NP in May 2022   Since May the pt says her breathing has been relatively stable     She presented to [facility] ED yesteray with abdominal pain over the past few days   CT from 9/15 showed mildly dilated loop of bowel LUQ, possibly early obstruction.   CT also showed a moderate R pleural effusion.    BNP on arrival noted to be 1480    Lasix 60 mg given IV.  Echo ordered     The pt denies any CP   Breathing is OK   SHe still gets around house  Husband helps her   NO change in activity level     She has O2 at night at home she says.     No dizziness   No palpitations    Abdomen hurts   Patients asks when she can go home    Past Medical History:  Diagnosis Date   Bilateral carpal tunnel syndrome 07/27/2014   Bipolar disorder (HCC)    geropsych admission to [facility] in Dec 2018   Chronic diastolic CHF (congestive heart failure) (HCC)    a. 03/2017 Echo: EF 55-60%, no rwma, Gr1 DD, mild MR; b. 06/2019 Echo: EF 55-60%, no rwma, mod LVH, GrII DD. Nl RV size/fxn. RVSP 54.49mmHg. Mildly dil LA.   Chronic low back pain 07/29/2014   Degenerative arthritis of lumbar spine 07/27/2014   Dementia (HCC)    Depression    Esophageal spasm 07/29/2014   GERD (gastroesophageal reflux disease)     History of stress test    a. 04/2017 MV: low risk stress test.  EF 55-65%. Probable apical ant/apical, basal, and mid inflat attenuation artifact vs ischemia/scar.   Hyperlipemia 07/29/2014   Hypertension    Pneumonia    Spinal stenosis 07/29/2014    Past Surgical History:  Procedure Laterality Date   HIP SURGERY Right    INNER EAR SURGERY Right    REPLACEMENT TOTAL KNEE BILATERAL Bilateral 07/27/14     Home Medications:  Prior to Admission medications   Medication Sig Start Date End Date Taking? Authorizing Provider  acetaminophen-codeine (TYLENOL #3) 300-30 MG per tablet Take 1 tablet by mouth every 8 (eight) hours as needed for moderate pain or severe pain.   Yes [provider]  cholecalciferol (VITAMIN D) 1000 units tablet Take 1,000 Units by mouth daily.   Yes [provider]  ferrous sulfate 325 (65 FE) MG EC tablet Take 325 mg by mouth daily with breakfast.   Yes [provider]  furosemide (LASIX) 20 MG tablet TAKE 1 TABLET BY MOUTH ONCE DAILY 08/03/20  Yes Alver Sorrow, NP  Lactobacillus Acid-Pectin (ACIDOPHILUS/PECTIN) CAPS Take by mouth.  Yes [provider]  losartan (COZAAR) 25 MG tablet Take 1 tablet (25 mg total) by mouth daily. 09/26/14  Yes Pucilowska, Jolanta B, MD  magnesium oxide (MAG-OX) 400 MG tablet Take 400 mg by mouth daily.   Yes [provider]  metoprolol tartrate (LOPRESSOR) 50 MG tablet Take 1 tablet (50 mg total) by mouth 2 (two) times daily. 08/03/20  Yes Alver Sorrow, NP  mirtazapine (REMERON) 45 MG tablet Take 1 tablet (45 mg total) by mouth at bedtime. 02/10/20  Yes Clapacs, Jackquline Denmark, MD  naloxone [facility]) nasal spray 4 mg/0.1 mL SMARTSIG:1 Spray(s) Both Nares Daily PRN 07/22/20  Yes [provider]  OLANZapine (ZYPREXA) 20 MG tablet Take 1 tablet (20 mg total) by mouth at bedtime. 02/10/20  Yes Clapacs, Jackquline Denmark, MD  omeprazole (PRILOSEC) 20 MG capsule Take 20 mg by mouth 2 (two) times daily before  a meal.    Yes [provider]  Probiotic Product (PROBIOTIC DAILY PO) Take by mouth daily.   Yes [provider]  simvastatin (ZOCOR) 40 MG tablet Take 40 mg by mouth at bedtime.    Yes [provider]  venlafaxine XR (EFFEXOR-XR) 150 MG 24 hr capsule 1 po daily 02/10/20  Yes Clapacs, Jackquline Denmark, MD    Inpatient Medications: Scheduled Meds:  enoxaparin (LOVENOX) injection  40 mg Subcutaneous Q24H   furosemide  40 mg Intravenous BID   losartan  25 mg Oral Daily   metoprolol tartrate  50 mg Oral BID   mirtazapine  45 mg Oral QHS   OLANZapine  20 mg Oral QHS   pantoprazole  40 mg Oral Daily   simvastatin  40 mg Oral QHS   venlafaxine XR  150 mg Oral Q breakfast   vitamin B-12  500 mcg Oral Daily   Continuous Infusions:  Allergies:    Allergies  Allergen Reactions   Aspirin Other (See Comments)    Unknown reaction   Sulfa Antibiotics Other (See Comments)    Unknown reaction    Social History:   Social History   Socioeconomic History   Marital status: Married    Spouse name: Not on file   Number of children: Not on file   Years of education: Not on file   Highest education level: Not on file  Occupational History    Comment: retired  Tobacco Use   Smoking status: Former    Types: Cigarettes    Quit date: 07/29/1975    Years since quitting: 45.4   Smokeless tobacco: Never  Vaping Use   Vaping Use: Never used  Substance and Sexual Activity   Alcohol use: No    Alcohol/week: 0.0 standard drinks   Drug use: No   Sexual activity: Never    Comment: postmenopause  Other Topics Concern   Not on file  Social History Narrative   Not on file   Social Determinants of Health   Financial Resource Strain: Not on file  Food Insecurity: Not on file  Transportation Needs: Not on file  Physical Activity: Not on file  Stress: Not on file  Social Connections: Not on file  Intimate Partner Violence: Not on file    Family History:    Family History   Problem Relation Age of Onset   Hypertension Mother    Stroke Mother      ROS:  Please see the history of present illness.   All other ROS reviewed and negative.     Physical Exam/Data:  Vitals:   12/11/20 0600 12/11/20 0630 12/11/20 0735 12/11/20 0830  BP: (!) 122/56 (!) 123/48 (!) 150/80 (!) 117/51  Pulse: 69 79 85 77  Resp: (!) 24 (!) 22 (!) 24 (!) 22  Temp:   98 F (36.7 C)   TempSrc:   Oral   SpO2: 99% 100% 98% 100%  Weight:      Height:       No intake or output data in the 24 hours ending 12/11/20 0903 Last 3 Weights 12/10/2020 08/03/2020 12/04/2019  Weight (lbs) 114 lb 121 lb 151 lb 6 oz  Weight (kg) 51.71 kg 54.885 kg 68.663 kg  Some encounter information is confidential and restricted. Go to Review Flowsheets activity to see all data.     Body mass index is 22.26 kg/m.  General:  Thin 85 yo, in no acute distress HEENT: normal Neck: no JVD   Vascular: No carotid bruits; Distal pulses 2+ bilaterally Cardiac:  normal S1, S2; RRR with occas skip   Gr II/VI systolic murmur LSB   Lungs:  clear to auscultation bilaterally, no wheezing, rhonchi or rales   Decreased BS at R base   Abd: Abdominal exam deferred as pt complained of pain   Ext: Tr edema Musculoskeletal:  No deformities, BUE and BLE strength normal and equal Skin: warm and dry  Neuro:  CNs 2-12 intact, no focal abnormalities noted Psych:  Normal affect   EKG:  The EKG was personally reviewed and demonstrates:  Poor quality tracing.  Motion   Probable SR vs ectopic atrial rhythm   84 bpm    Occasional PAC  Septal MI   Nonspecific ST changes    Telemetry:  Telemetry was personally reviewed and demonstrates:  SR with PACs   Relevant CV Studies: Echo   April 2021   1. Left ventricular ejection fraction, by estimation, is 55 to 60%. The  left ventricle has normal function. The left ventricle has no regional  wall motion abnormalities. There is moderate left ventricular hypertrophy.  Left ventricular  diastolic  parameters are consistent with Grade II diastolic dysfunction  (pseudonormalization).   2. Right ventricular systolic function is normal. The right ventricular  size is normal. There is moderately elevated pulmonary artery systolic  pressure. The estimated right ventricular systolic pressure is 54.6 mmHg.   3. Left atrial size was mildly dilated.   Comparison(s): EF 55-60%.   Narrative & Impression  Low risk, probably normal myocardial perfusion stress test. There is a small in size, moderate in severity, partial reversible defect involving the apical anterior and apical segments that may represent artifact (attenuation and apical thinning) but cannot rule out ischemia and scar. There is a small in size, mild in severity, fixed basal and mid inferolateral defect most likely representing artifact (attenuation and misregistration) and less likely scar. The left ventricular ejection fraction is normal (55-65%) with normal regional wall motion.    Laboratory Data:  High Sensitivity Troponin:  No results for input(s): TROPONINIHS in the last 720 hours.   Chemistry Recent Labs  Lab 12/10/20 1958  NA 138  K 4.5  CL 105  CO2 26  GLUCOSE 119*  BUN 23  CREATININE 0.96  CALCIUM 8.5*  GFRNONAA 59*  ANIONGAP 7    Recent Labs  Lab 12/10/20 1958  PROT 6.4*  ALBUMIN 3.7  AST 19  ALT 15  ALKPHOS 60  BILITOT 0.7   Lipids No results for input(s): CHOL, TRIG, HDL, LABVLDL, LDLCALC, CHOLHDL in the last 168  hours.  Hematology Recent Labs  Lab 12/10/20 1958  WBC 4.5  RBC 2.44*  HGB 9.5*  HCT 28.5*  MCV 116.8*  MCH 38.9*  MCHC 33.3  RDW 14.6  PLT 217   Thyroid No results for input(s): TSH, FREET4 in the last 168 hours.  BNP Recent Labs  Lab 12/10/20 1958  BNP 1,480.5*    DDimer No results for input(s): DDIMER in the last 168 hours.   Radiology/Studies:  DG Abdomen Acute W/Chest  Result Date: 12/10/2020 CLINICAL DATA:  eval pleural effusion/infiltrate on  right chest. eval SBO 2/2 LUQ pain EXAM: DG ABDOMEN ACUTE WITH 1 VIEW CHEST COMPARISON:  Chest x-ray 02/28/2009, CT chest abdomen pelvis 12/20/2017 FINDINGS: The heart and mediastinal contours are unchanged. Aortic calcification. Elevated right hemidiaphragm. Right base atelectasis. No focal consolidation. No pulmonary edema. Trace right pleural effusion not excluded. No left pleural effusion. No pneumothorax. There is no evidence of dilated bowel loops or free intraperitoneal air. Surgical clips noted within the abdomen. No radiopaque calculi or other significant radiographic abnormality is seen. Vascular calcifications. No acute osseous abnormality. Severe degenerative changes of the thoracolumbar spine. IMPRESSION: Trace right pleural effusion not excluded. Elevated right hemidiaphragm with right base atelectasis. Nonobstructive bowel gas pattern. Electronically Signed   By: Tish Frederickson M.D.   On: 12/10/2020 23:01     Assessment and Plan:   Acute on chronic diastolic CHF  Pt with hx of chronic diastolic CHF   Last seen in May 2022   From fluid/breathing standpoint had been doing OK   PResented yesterday with abdominal pain  CT (outside) demonstrated moderate R pleural effusion   CXR here shows elevated R hemidiaphragm and small effusion   BNP noted to be elevate  at 1400.  GIven 60 mg Lasix IV x 1. (Putting out pale urine) ON exam mouth is dry, JVP is not elevated    No signficant edema      I would recomm holding further diuresis for now since she is NPO  FOllow I/O closely with lasix  SHe may need a low maintenance rate  of IV fluids  if remains NPO long.     Echo has been ordered     2   Rhythm   Poor EKG   ? Ectopic atrial rhythm vs SR   REpeat EKG     Continue telemetry    SHe has had a HS sinus bradycardia  Rates OK in ED  3  HTN  BP a little labile but overall OK   FOllow   Continue meds   4  HL   ON simvistatin 40 at home   5   GI   PT NPO    Abdomenal series shows normal gas pattern    CT showed mildly dilated loop of bowel in LUQ   Surgery has been consulted.          For questions or updates, please contact [facility] Please consult [email or web address] for contact info under    Signed, Dietrich Pates, MD  12/11/2020 9:03 AM

## 2020-12-11 NOTE — Progress Notes (Signed)
*  PRELIMINARY RESULTS* Echocardiogram 2D Echocardiogram has been performed.  Jenna Dudley 12/11/2020, 12:41 PM

## 2020-12-12 ENCOUNTER — Other Ambulatory Visit: Payer: Self-pay

## 2020-12-12 ENCOUNTER — Inpatient Hospital Stay: Payer: Medicare Other

## 2020-12-12 DIAGNOSIS — I5033 Acute on chronic diastolic (congestive) heart failure: Secondary | ICD-10-CM | POA: Diagnosis not present

## 2020-12-12 LAB — IRON AND TIBC
Iron: 53 ug/dL (ref 28–170)
Saturation Ratios: 25 % (ref 10.4–31.8)
TIBC: 210 ug/dL — ABNORMAL LOW (ref 250–450)
UIBC: 157 ug/dL

## 2020-12-12 LAB — BASIC METABOLIC PANEL
Anion gap: 9 (ref 5–15)
BUN: 24 mg/dL — ABNORMAL HIGH (ref 8–23)
CO2: 29 mmol/L (ref 22–32)
Calcium: 8.5 mg/dL — ABNORMAL LOW (ref 8.9–10.3)
Chloride: 100 mmol/L (ref 98–111)
Creatinine, Ser: 0.94 mg/dL (ref 0.44–1.00)
GFR, Estimated: >60 mL/min (ref >60)
Glucose, Bld: 83 mg/dL (ref 70–99)
Potassium: 3.6 mmol/L (ref 3.5–5.1)
Sodium: 138 mmol/L (ref 135–145)

## 2020-12-12 LAB — MAGNESIUM: Magnesium: 1.8 mg/dL (ref 1.7–2.4)

## 2020-12-12 LAB — CBC
HCT: 27.2 % — ABNORMAL LOW (ref 36.0–46.0)
Hemoglobin: 9.3 g/dL — ABNORMAL LOW (ref 12.0–15.0)
MCH: 38.9 pg — ABNORMAL HIGH (ref 26.0–34.0)
MCHC: 34.2 g/dL (ref 30.0–36.0)
MCV: 113.8 fL — ABNORMAL HIGH (ref 80.0–100.0)
Platelets: 193 10*3/uL (ref 150–400)
RBC: 2.39 MIL/uL — ABNORMAL LOW (ref 3.87–5.11)
RDW: 14.4 % (ref 11.5–15.5)
WBC: 3.5 10*3/uL — ABNORMAL LOW (ref 4.0–10.5)
nRBC: 0 % (ref 0.0–0.2)

## 2020-12-12 LAB — VITAMIN D 25 HYDROXY (VIT D DEFICIENCY, FRACTURES): Vit D, 25-Hydroxy: 13.94 ng/mL — ABNORMAL LOW (ref 30–100)

## 2020-12-12 LAB — PHOSPHORUS: Phosphorus: 4.6 mg/dL (ref 2.5–4.6)

## 2020-12-12 MED ORDER — BISACODYL 10 MG RE SUPP
10.0000 mg | Freq: Once | RECTAL | Status: AC
  Administered 2020-12-12: 10 mg via RECTAL
  Filled 2020-12-12 (×2): qty 1

## 2020-12-12 MED ORDER — VITAMIN D (ERGOCALCIFEROL) 1.25 MG (50000 UNIT) PO CAPS
50000.0000 [IU] | ORAL_CAPSULE | ORAL | Status: DC
Start: 2020-12-12 — End: 2020-12-17
  Filled 2020-12-12: qty 1

## 2020-12-12 MED ORDER — CHLORHEXIDINE GLUCONATE CLOTH 2 % EX PADS
6.0000 | MEDICATED_PAD | Freq: Every day | CUTANEOUS | Status: DC
  Administered 2020-12-12 – 2020-12-17 (×6): 6 via TOPICAL

## 2020-12-12 MED ORDER — MINERAL OIL RE ENEM
1.0000 | ENEMA | Freq: Once | RECTAL | Status: AC
  Administered 2020-12-12: 1 via RECTAL

## 2020-12-12 NOTE — ED Notes (Signed)
Pt asleep in bed, respirs even and unlabored. Offered food, pt states she wants to sleep

## 2020-12-12 NOTE — ED Notes (Signed)
Pt lying in bed, eyes closed; nad noted; lights down

## 2020-12-12 NOTE — Progress Notes (Signed)
Progress Note  Patient Name: Jenna Dudley Date of Encounter: 12/12/2020  Primary Cardiologist: Lorine Bears, MD   Subjective   Remains somnolent. Does not answer questions. Note oxygen desaturation when O2 removed  Inpatient Medications    Scheduled Meds:  bisacodyl  10 mg Oral BID   enoxaparin (LOVENOX) injection  40 mg Subcutaneous Q24H   losartan  25 mg Oral Daily   metoprolol tartrate  50 mg Oral BID   mineral oil  1 enema Rectal Once   mirtazapine  45 mg Oral QHS   OLANZapine  20 mg Oral QHS   pantoprazole  40 mg Oral Daily   polyethylene glycol  17 g Oral Daily   simvastatin  40 mg Oral QHS   venlafaxine XR  150 mg Oral Q breakfast   vitamin B-12  500 mcg Oral Daily   Vitamin D (Ergocalciferol)  50,000 Units Oral Q7 days   Continuous Infusions:  PRN Meds: acetaminophen **OR** acetaminophen, acetaminophen-codeine, ondansetron **OR** ondansetron (ZOFRAN) IV   Vital Signs    Vitals:   12/12/20 0900 12/12/20 1000 12/12/20 1100 12/12/20 1200  BP: 124/66 (!) 109/59 (!) 142/69 (!) 141/67  Pulse: 98 80 71 82  Resp: 20 20 (!) 22 19  Temp:      TempSrc:      SpO2: 94% 93% 97% 97%  Weight:      Height:        Intake/Output Summary (Last 24 hours) at 12/12/2020 1218 Last data filed at 12/12/2020 0841 Gross per 24 hour  Intake --  Output 1050 ml  Net -1050 ml   Filed Weights   12/10/20 1938  Weight: 51.7 kg    Telemetry    nsr - Personally Reviewed  ECG    none - Personally Reviewed  Physical Exam   GEN: No acute distress.  Does not answer questions.  Neck: 6 cm JVD Cardiac: RRR, no murmurs, rubs, or gallops.  Respiratory: Clear to auscultation bilaterally. GI: Soft, nontender, non-distended  MS: No edema; No deformity. Neuro:  Nonfocal  Psych: Normal affect   Labs    Chemistry Recent Labs  Lab 12/10/20 1958 12/12/20 0703  NA 138 138  K 4.5 3.6  CL 105 100  CO2 26 29  GLUCOSE 119* 83  BUN 23 24*  CREATININE 0.96 0.94  CALCIUM  8.5* 8.5*  PROT 6.4*  --   ALBUMIN 3.7  --   AST 19  --   ALT 15  --   ALKPHOS 60  --   BILITOT 0.7  --   GFRNONAA 59* >60  ANIONGAP 7 9     Hematology Recent Labs  Lab 12/10/20 1958 12/12/20 0703  WBC 4.5 3.5*  RBC 2.44* 2.39*  HGB 9.5* 9.3*  HCT 28.5* 27.2*  MCV 116.8* 113.8*  MCH 38.9* 38.9*  MCHC 33.3 34.2  RDW 14.6 14.4  PLT 217 193    Cardiac EnzymesNo results for input(s): TROPONINI in the last 168 hours. No results for input(s): TROPIPOC in the last 168 hours.   BNP Recent Labs  Lab 12/10/20 1958  BNP 1,480.5*     DDimer No results for input(s): DDIMER in the last 168 hours.   Radiology    CT ABDOMEN PELVIS W CONTRAST  Result Date: 12/11/2020 CLINICAL DATA:  Abdominal distention EXAM: CT ABDOMEN AND PELVIS WITH CONTRAST TECHNIQUE: Multidetector CT imaging of the abdomen and pelvis was performed using the standard protocol following bolus administration of intravenous contrast. CONTRAST:  57mL  OMNIPAQUE IOHEXOL 350 MG/ML SOLN COMPARISON:  CT abdomen/pelvis 12/20/2017 FINDINGS: Lower chest: There is a moderate size right pleural effusion with adjacent consolidation. The heart is mildly enlarged. Coronary artery calcifications, aortic valve calcifications, and mitral annular calcifications are noted. There is no pericardial effusion. Hepatobiliary: The liver is heterogeneously enhancing on the early phase images but homogeneous on the delayed phase, suggesting perfusional alterations. There are no focal parenchymal lesions. The gallbladder is surgically absent. There is new moderate intra and extrahepatic biliary ductal dilatation with the common bile duct measuring up to 1.1 cm. No radiopaque stone or other obstructing lesion is seen. Pancreas: The pancreas is atrophic but otherwise unremarkable. There is no focal lesion or contour abnormality. There is no main pancreatic ductal dilatation or peripancreatic inflammatory change. Spleen: Unremarkable. Adrenals/Urinary  Tract: The adrenals are mildly thickened bilaterally with no focal lesion. The kidneys enhance symmetrically with symmetric excretion of contrast into the collecting system on the delayed phase images. There is no focal lesion or stone. There is no hydronephrosis or hydroureter. There is mild perinephric stranding bilaterally, nonspecific. The bladder is decompressed by a Foley catheter. Stomach/Bowel: There is a small hiatal hernia. The stomach is otherwise unremarkable. There is no evidence of bowel obstruction. There is a moderate stool burden throughout the colon. There is no abnormal bowel wall thickening or inflammatory change. Vascular/Lymphatic: There is extensive calcified atherosclerotic plaque throughout the nonaneurysmal abdominal aorta. The major branch vessels are patent. The main portal and splenic veins are patent. Reproductive: The uterus is surgically absent. There is no adnexal mass. Other: There is trace free fluid in the presacral space, nonspecific. There is no organized or drainable fluid collection. There is no free air. Surgical clips are noted along the right ventral abdominal wall. Musculoskeletal: There is a subacute fracture of the right L2 transverse process, new since 12/20/2017. There is advanced multilevel degenerative change throughout the spine and of the hips, right worse than left. There is mild compression deformity of the L1 vertebral body, similar to the prior CT. IMPRESSION: 1. Moderate intra and extrahepatic biliary ductal dilatation is increased since 12/20/2017. No focal lesion or stone is seen. This could be further evaluated with MRI/MRCP (this could be performed on an outpatient basis depending on level of clinical concern). 2. Moderate colonic stool burden without evidence of mechanical obstruction. 3. Moderate-size right pleural effusion. Consider dedicated imaging of the chest as indicated. 4. Mild cardiomegaly. Dense coronary artery calcifications, aortic valve  calcifications, and mild mitral annular calcifications. 5. Trace free fluid in the presacral space, nonspecific. 6. Subacute appearing fracture of the right L2 transverse process. Aortic Atherosclerosis (ICD10-I70.0). Electronically Signed   By: Lesia Hausen M.D.   On: 12/11/2020 13:54   DG Abdomen Acute W/Chest  Result Date: 12/10/2020 CLINICAL DATA:  eval pleural effusion/infiltrate on right chest. eval SBO 2/2 LUQ pain EXAM: DG ABDOMEN ACUTE WITH 1 VIEW CHEST COMPARISON:  Chest x-ray 02/28/2009, CT chest abdomen pelvis 12/20/2017 FINDINGS: The heart and mediastinal contours are unchanged. Aortic calcification. Elevated right hemidiaphragm. Right base atelectasis. No focal consolidation. No pulmonary edema. Trace right pleural effusion not excluded. No left pleural effusion. No pneumothorax. There is no evidence of dilated bowel loops or free intraperitoneal air. Surgical clips noted within the abdomen. No radiopaque calculi or other significant radiographic abnormality is seen. Vascular calcifications. No acute osseous abnormality. Severe degenerative changes of the thoracolumbar spine. IMPRESSION: Trace right pleural effusion not excluded. Elevated right hemidiaphragm with right base atelectasis. Nonobstructive bowel gas  pattern. Electronically Signed   By: Tish Frederickson M.D.   On: 12/10/2020 23:01   ECHOCARDIOGRAM COMPLETE  Result Date: 12/11/2020    ECHOCARDIOGRAM REPORT   Patient Name:   Jenna Dudley Date of Exam: 12/11/2020 Medical Rec #:  [id number]     Height:       60.0 in Accession #:    [id number]    Weight:       114.0 lb Date of Birth:  [birth year]     BSA:          1.470 m Patient Age:    85 years      BP:           110/50 mmHg Patient Gender: F             HR:           59 bpm. Exam Location:  [facility] Procedure: 2D Echo, Cardiac Doppler and Color Doppler Indications:     CHF-Acute Diastolic I50.31  History:         Patient has prior history of Echocardiogram examinations. CHF;                   Risk Factors:Hypertension.  Sonographer:     Neysa Bonito Roar Referring Phys:  [id number] Andris Baumann Diagnosing Phys: Julien Nordmann MD IMPRESSIONS  1. Left ventricular ejection fraction, by estimation, is 60 to 65%. The left ventricle has normal function. The left ventricle has no regional wall motion abnormalities. There is mmoderate to severe left ventricular hypertrophy with near cavity obliteration in systole. Left ventricular diastolic parameters are consistent with Grade III diastolic dysfunction (restrictive).  2. Right ventricular systolic function is normal. The right ventricular size is normal. There is severely elevated pulmonary artery systolic pressure. The estimated right ventricular systolic pressure is 61.9 mmHg.  3. Left atrial size was mild to moderately dilated.  4. The mitral valve is normal in structure. Mild to moderate mitral valve regurgitation.  5. Tricuspid valve regurgitation is moderate.  6. The inferior vena cava is dilated in size with >50% respiratory variability, suggesting right atrial pressure of 8 mmHg. FINDINGS  Left Ventricle: Left ventricular ejection fraction, by estimation, is 60 to 65%. The left ventricle has normal function. The left ventricle has no regional wall motion abnormalities. The left ventricular internal cavity size was normal in size. There is  severe left ventricular hypertrophy. Left ventricular diastolic parameters are consistent with Grade III diastolic dysfunction (restrictive). Right Ventricle: The right ventricular size is normal. No increase in right ventricular wall thickness. Right ventricular systolic function is normal. There is severely elevated pulmonary artery systolic pressure. The tricuspid regurgitant velocity is 3.77 m/s, and with an assumed right atrial pressure of 5 mmHg, the estimated right ventricular systolic pressure is 61.9 mmHg. Left Atrium: Left atrial size was mild to moderately dilated. Right Atrium: Right atrial size was normal in  size. Pericardium: There is no evidence of pericardial effusion. Mitral Valve: The mitral valve is normal in structure. Mild to moderate mitral valve regurgitation. No evidence of mitral valve stenosis. Tricuspid Valve: The tricuspid valve is normal in structure. Tricuspid valve regurgitation is moderate . No evidence of tricuspid stenosis. Aortic Valve: The aortic valve is normal in structure. Aortic valve regurgitation is not visualized. Mild aortic valve sclerosis is present, with no evidence of aortic valve stenosis. Aortic valve peak gradient measures 14.5 mmHg. Pulmonic Valve: The pulmonic valve was normal in structure. Pulmonic valve regurgitation is not visualized. No  evidence of pulmonic stenosis. Aorta: The aortic root is normal in size and structure. Venous: The inferior vena cava is dilated in size with greater than 50% respiratory variability, suggesting right atrial pressure of 8 mmHg. IAS/Shunts: No atrial level shunt detected by color flow Doppler.  LEFT VENTRICLE PLAX 2D LVIDd:         3.70 cm  Diastology LVIDs:         2.50 cm  LV e' medial:    4.35 cm/s LV PW:         1.30 cm  LV E/e' medial:  30.1 LV IVS:        1.90 cm  LV e' lateral:   3.83 cm/s LVOT diam:     1.50 cm  LV E/e' lateral: 34.2 LVOT Area:     1.77 cm  RIGHT VENTRICLE RV Basal diam:  2.60 cm RV Mid diam:    2.30 cm RV S prime:     9.60 cm/s TAPSE (M-mode): 1.7 cm LEFT ATRIUM             Index       RIGHT ATRIUM           Index LA diam:        4.50 cm 3.06 cm/m  RA Area:     15.10 cm LA Vol (A2C):   81.6 ml 55.51 ml/m RA Volume:   37.00 ml  25.17 ml/m LA Vol (A4C):   72.3 ml 49.19 ml/m LA Biplane Vol: 77.6 ml 52.79 ml/m  AORTIC VALVE                PULMONIC VALVE AV Area (Vmax): 0.81 cm    PV Vmax:        0.91 m/s AV Vmax:        190.50 cm/s PV Peak grad:   3.3 mmHg AV Peak Grad:   14.5 mmHg   RVOT Peak grad: 2 mmHg LVOT Vmax:      87.30 cm/s  AORTA Ao Root diam: 2.40 cm MITRAL VALVE                TRICUSPID VALVE MV Area  (PHT): 4.36 cm     TR Peak grad:   56.9 mmHg MV Decel Time: 174 msec     TR Vmax:        377.00 cm/s MV E velocity: 131.00 cm/s MV A velocity: 72.30 cm/s   SHUNTS MV E/A ratio:  1.81         Systemic Diam: 1.50 cm MV A Prime:    4.0 cm/s Julien Nordmann MD Electronically signed by Julien Nordmann MD Signature Date/Time: 12/11/2020/3:06:26 PM    Final     Cardiac Studies   nsr  Patient Profile     85 y.o. female admitted for abdominal pain and found to have mod-severe pulmonary HTN.  Assessment & Plan    Acute on chronic diastolic heart failure - she's been treated with IV lasix. Still some desaturation on room air raising a suspicion for underlying lung disease. 2D echo with severe diastolic heart failure and noted cavity obliteration of the LV.  Pulmonary HTN - etiology is unclear. I wonder if it could be due to severe diastolic dysfunction or primary pulm HTN. I suspect that her oxygen requirements are due to pulmonary HTN. We will check a CXR. MAT - she is in NSR this morning.  4. Abdominal pain - she did not complain today.    For questions or updates,  please contact [facility] Please consult [email or web address] for contact info under Cardiology/STEMI.      Signed, Lewayne Bunting, MD  12/12/2020, 12:18 PM

## 2020-12-12 NOTE — Progress Notes (Addendum)
[facility] Progress Note  Patient: Jenna Dudley    RSW:[id number]  DOA: 12/10/2020     Date of Service: the patient was seen and examined on 12/12/2020  Chief Complaint  Patient presents with   Abdominal Pain   Brief hospital course: Jenna Dudley is a 85 y.o. female with medical history significant for Chronic diastolic heart failure, bipolar disorder, HTN, dementia, chronic low back pain, who was sent in by her PCP with an abnormal CT abdomen and pelvis concerning for partial small bowel obstruction after presenting with a complaint of abdominal pain and diarrhea.  Patient continues to have left upper and lower quadrant pain but states that diarrhea has resolved.  Patient endorses short of breath with exertion and at nights with lying flat.  She has had no cough, and no chest pain.  Has had no lower extremity edema.   ED course: On arrival, afebrile, pulse 78 with BP 116/49 and O2 sat 97% on room air Blood work: CBC remarkable for normal WBC, hemoglobin 9.5 which is about her baseline on care everywhere.  CMP mostly unremarkable.  BNP elevated at 1480.   EKG, personally viewed and interpreted: NSR at 74 with no acute ST-T wave changes   Imaging: Acute abdomen with chest: Trace right pleural effusion with nonobstructive bowel gas pattern CT abdomen and pelvis from 9/15 on Care Everywhere: Mildly dilated loop of bowel within the left upper quadrant with transition point within the left mid abdomen, likely physiologic or may possibly related to early obstruction, recommend clinical correlation Moderate right pleural effusion new from 11/24/2020   Patient treated with a dose of Lasix 60 mg.  Hospitalist consulted for admission.   Assessment and Plan:   # Partial small bowel obstruction, resolved - Acute abdomen series on 9/16 shows normal bowel gas pattern however CT abdomen/pelvis from 9/15 showsMildly dilated loop of bowel within the left upper quadrant with transition point within  the left mid abdomen, likely physiologic or may possibly related to early obstruction 9/17 CT a/p: 1. Moderate intra and extrahepatic biliary ductal dilatation is increased since 12/20/2017. No focal lesion or stone is seen. This could be further evaluated with MRI/MRCP (this could be performed on an outpatient basis depending on level of clinical concern). 2. Moderate colonic stool burden without evidence of mechanical obstruction. 3. Moderate-size right pleural effusion. Consider dedicated imaging of the chest as indicated. No surgical intervention needed so general surgery consult was consulted Started laxatives for constipation, mineral oil enema x1 ordered Resumed diet   # Acute Urine retention, POA, active  bladder scan 369 mL, Foley catheter was inserted Continue Foley catheter for 7 days and then follow voiding trial and follow with urology as an outpatient.  # Acute on chronic diastolic CHF  # Right pleural effusion - CT abdomen pelvis from 9/15 shows moderate right pleural effusion.  BNP over 1400 -s/p IV Lasix 60 mg x 1 dose - Continue home metoprolol and losartan - Daily weights with intake and output monitoring - Echocardiogram shows LVEF 665%, grade 3 diastolic dysfunction, moderate mitral and tricuspid regurgitation Patient will benefit from thoracentesis, consider ultrasound-guided thoracentesis on Monday Patient was seen by cardiology, recommended patient is not volume overload, may be little bit dry so hold off IV Lasix for diuresis.  Patient may need hydration if patient remains n.p.o. Follow IR for thoracentesis tomorrow a.m. patient has bipolardisorder, discussed thoracentesis with patient's husband who agreed   Chronic Anemia -Hemoglobin 9.5, at baseline of 9.8 on  Care Everywhere    Essential hypertension - Continue metoprolol and losartan     Chronic low back pain - Continue Tylenol 3 as needed     Depression, bipolar disorder - Continue venlafaxine, Zyprexa  and Remeron     Dementia  - Delirium precautions   Vitamin D deficiency, started vitamin D 50k units p.o. weekly.  Follow with PCP to repeat vitamin D level after 3 months.  Body mass index is 22.26 kg/m.  Interventions:       Diet: Heart healthy DVT Prophylaxis: Subcutaneous Lovenox   Advance goals of care discussion: Full code  Family Communication: family was NOT present at bedside, at the time of interview.  The pt provided permission to discuss medical plan with the family. Opportunity was given to ask question and all questions were answered satisfactorily.   Disposition:  Pt is from Home, admitted with abdominal pain and shortness of breath, found to have constipation, urine retention and right-sided pleural effusion.  Still no BM, and needs thoracentesis which precludes a safe discharge. Discharge to home, when medically stable, may need 2 to 3 days more.  Subjective: No significant overnight events, patient was resting comfortably, patient has been was admitted.  Patient was complaining of lower backache which is chronic, abdominal pain seems to be resolved.  Patient have not had a BM yet, patient was encouraged to take Foley catheter and enema in the evening if no BM.  Patient refused enema last night. Patient was sleepy and has dementia, bipolar disorder, not a very good historian.  Unable to offer complaints.    Physical Exam: General:  alert oriented to place and person.  Appear in mild distress, affect appropriate Eyes: PERRLA ENT: Oral Mucosa Clear, moist  Neck: no JVD,  Cardiovascular: S1 and S2 Present,  Murmur audible Respiratory: good respiratory effort, Bilateral Air entry equal and Decreased, bibasilar crackles, decreased breath sounds right lower lobe, no wheezes Abdomen: Bowel Sound present, Soft and mild generalized tenderness,  Skin: No rashes Extremities: no  Pedal edema, no calf tenderness Neurologic: without any new focal findings Gait not checked  due to patient safety concerns  Vitals:   12/12/20 1000 12/12/20 1100 12/12/20 1200 12/12/20 1300  BP: (!) 109/59 (!) 142/69 (!) 141/67 (!) 108/48  Pulse: 80 71 82 69  Resp: 20 (!) 22 19 16   Temp:      TempSrc:      SpO2: 93% 97% 97% 96%  Weight:      Height:        Intake/Output Summary (Last 24 hours) at 12/12/2020 1332 Last data filed at 12/12/2020 0841 Gross per 24 hour  Intake --  Output 1050 ml  Net -1050 ml   Filed Weights   12/10/20 1938  Weight: 51.7 kg    Data Reviewed: I have personally reviewed and interpreted daily labs, tele strips, imagings as discussed above. I reviewed all nursing notes, pharmacy notes, vitals, pertinent old records I have discussed plan of care as described above with RN and patient/family.  CBC: Recent Labs  Lab 12/10/20 1958 12/12/20 0703  WBC 4.5 3.5*  HGB 9.5* 9.3*  HCT 28.5* 27.2*  MCV 116.8* 113.8*  PLT 217 193   Basic Metabolic Panel: Recent Labs  Lab 12/10/20 1958 12/12/20 0703  NA 138 138  K 4.5 3.6  CL 105 100  CO2 26 29  GLUCOSE 119* 83  BUN 23 24*  CREATININE 0.96 0.94  CALCIUM 8.5* 8.5*  MG  --  1.8  PHOS  --  4.6    Studies: DG Chest Port 1 View  Result Date: 12/12/2020 CLINICAL DATA:  Short of breath, poor historian EXAM: PORTABLE CHEST 1 VIEW COMPARISON:  None. FINDINGS: Enlarged cardiac silhouette. Central venous congestion. Low lung volumes. No focal consolidation. No pneumothorax IMPRESSION: Cardiomegaly with central venous congestion. Concern for congestive heart failure. Electronically Signed   By: Genevive Bi M.D.   On: 12/12/2020 13:10    Scheduled Meds:  bisacodyl  10 mg Oral BID   bisacodyl  10 mg Rectal Once   enoxaparin (LOVENOX) injection  40 mg Subcutaneous Q24H   losartan  25 mg Oral Daily   metoprolol tartrate  50 mg Oral BID   mineral oil  1 enema Rectal Once   mineral oil  1 enema Rectal Once   mirtazapine  45 mg Oral QHS   OLANZapine  20 mg Oral QHS   pantoprazole  40 mg  Oral Daily   polyethylene glycol  17 g Oral Daily   simvastatin  40 mg Oral QHS   venlafaxine XR  150 mg Oral Q breakfast   vitamin B-12  500 mcg Oral Daily   Vitamin D (Ergocalciferol)  50,000 Units Oral Q7 days   Continuous Infusions: PRN Meds: acetaminophen **OR** acetaminophen, acetaminophen-codeine, ondansetron **OR** ondansetron (ZOFRAN) IV  Time spent: 35 minutes  Author: Gillis Santa. MD [facility] 12/12/2020 1:32 PM  To reach On-call, see care teams to locate the attending and reach out to them via [email or web address]. If 7PM-7AM, please contact night-coverage If you still have difficulty reaching the attending provider, please page the [facility] (Director on Call) for [facility] on amion for assistance.

## 2020-12-12 NOTE — ED Notes (Signed)
Pt resting with eyes closed, lights down; nad noted

## 2020-12-12 NOTE — ED Notes (Signed)
Trialed pt on room air. While pt was awake, she desatted to low-mid 80s on RA. Pt placed back on 2L [place] with immediate improvement of sats to upper 90s

## 2020-12-13 ENCOUNTER — Inpatient Hospital Stay: Payer: Medicare Other

## 2020-12-13 DIAGNOSIS — I10 Essential (primary) hypertension: Secondary | ICD-10-CM | POA: Diagnosis not present

## 2020-12-13 DIAGNOSIS — Z8673 Personal history of transient ischemic attack (TIA), and cerebral infarction without residual deficits: Secondary | ICD-10-CM

## 2020-12-13 DIAGNOSIS — M545 Low back pain, unspecified: Secondary | ICD-10-CM | POA: Diagnosis not present

## 2020-12-13 DIAGNOSIS — G8929 Other chronic pain: Secondary | ICD-10-CM

## 2020-12-13 DIAGNOSIS — F0390 Unspecified dementia without behavioral disturbance: Secondary | ICD-10-CM

## 2020-12-13 DIAGNOSIS — I5033 Acute on chronic diastolic (congestive) heart failure: Secondary | ICD-10-CM | POA: Diagnosis not present

## 2020-12-13 LAB — BODY FLUID CELL COUNT WITH DIFFERENTIAL
Eos, Fluid: 0 %
Lymphs, Fluid: 58 %
Monocyte-Macrophage-Serous Fluid: 38 %
Neutrophil Count, Fluid: 4 %
Total Nucleated Cell Count, Fluid: 136 cu mm

## 2020-12-13 LAB — HEPATIC FUNCTION PANEL
ALT: 10 U/L (ref 0–44)
AST: 14 U/L — ABNORMAL LOW (ref 15–41)
Albumin: 3.3 g/dL — ABNORMAL LOW (ref 3.5–5.0)
Alkaline Phosphatase: 47 U/L (ref 38–126)
Bilirubin, Direct: 0.1 mg/dL (ref 0.0–0.2)
Indirect Bilirubin: 0.7 mg/dL (ref 0.3–0.9)
Total Bilirubin: 0.8 mg/dL (ref 0.3–1.2)
Total Protein: 5.7 g/dL — ABNORMAL LOW (ref 6.5–8.1)

## 2020-12-13 LAB — PROTEIN, PLEURAL OR PERITONEAL FLUID: Total protein, fluid: <3 g/dL

## 2020-12-13 LAB — CBC
HCT: 29.1 % — ABNORMAL LOW (ref 36.0–46.0)
Hemoglobin: 9.5 g/dL — ABNORMAL LOW (ref 12.0–15.0)
MCH: 37 pg — ABNORMAL HIGH (ref 26.0–34.0)
MCHC: 32.6 g/dL (ref 30.0–36.0)
MCV: 113.2 fL — ABNORMAL HIGH (ref 80.0–100.0)
Platelets: 197 10*3/uL (ref 150–400)
RBC: 2.57 MIL/uL — ABNORMAL LOW (ref 3.87–5.11)
RDW: 14.1 % (ref 11.5–15.5)
WBC: 3.4 10*3/uL — ABNORMAL LOW (ref 4.0–10.5)
nRBC: 0 % (ref 0.0–0.2)

## 2020-12-13 LAB — BASIC METABOLIC PANEL
Anion gap: 4 — ABNORMAL LOW (ref 5–15)
BUN: 25 mg/dL — ABNORMAL HIGH (ref 8–23)
CO2: 31 mmol/L (ref 22–32)
Calcium: 8.3 mg/dL — ABNORMAL LOW (ref 8.9–10.3)
Chloride: 103 mmol/L (ref 98–111)
Creatinine, Ser: 0.9 mg/dL (ref 0.44–1.00)
GFR, Estimated: >60 mL/min (ref >60)
Glucose, Bld: 85 mg/dL (ref 70–99)
Potassium: 3.8 mmol/L (ref 3.5–5.1)
Sodium: 138 mmol/L (ref 135–145)

## 2020-12-13 LAB — LACTATE DEHYDROGENASE: LDH: 135 U/L (ref 98–192)

## 2020-12-13 LAB — PHOSPHORUS: Phosphorus: 4.2 mg/dL (ref 2.5–4.6)

## 2020-12-13 LAB — LACTATE DEHYDROGENASE, PLEURAL OR PERITONEAL FLUID: LD, Fluid: 50 U/L — ABNORMAL HIGH (ref 3–23)

## 2020-12-13 LAB — MAGNESIUM: Magnesium: 1.8 mg/dL (ref 1.7–2.4)

## 2020-12-13 LAB — PATHOLOGIST SMEAR REVIEW

## 2020-12-13 MED ORDER — COVID-19MRNA BIVAL VACC PFIZER 30 MCG/0.3ML IM SUSP
0.3000 mL | Freq: Once | INTRAMUSCULAR | Status: AC
  Administered 2020-12-14: 0.3 mL via INTRAMUSCULAR
  Filled 2020-12-13: qty 0.3

## 2020-12-13 MED ORDER — INFLUENZA VAC A&B SA ADJ QUAD 0.5 ML IM PRSY
0.5000 mL | PREFILLED_SYRINGE | INTRAMUSCULAR | Status: DC
  Filled 2020-12-13: qty 0.5

## 2020-12-13 NOTE — Procedures (Signed)
PROCEDURE SUMMARY:  Successful US guided right thoracentesis. Yielded 800 ml of clear yellow fluid. Pt tolerated procedure well. No immediate complications.  Specimen sent for labs. CXR ordered; results pending  EBL < 2 mL  Mickie Kay, NP 12/13/2020 11:25 AM

## 2020-12-13 NOTE — Evaluation (Signed)
Physical Therapy Evaluation Patient Details Name: Jenna Dudley MRN: [id number] DOB: 1936-02-26 Today's Date: 12/13/2020  History of Present Illness  85 y.o. female with past medical history of chronic diastolic heart failure, bipolar disorder, hypertension, dementia, chronic low back pain presented to the hospital with abnormal CT scan concerning for partial small bowel obstruction (now resolved, managed by laxatives).  Patient also found to have moderate right pleural effusion (with scheduled theoracentesis 9/19)  Clinical Impression  Pt in bed, asleep, awakens easily to verbal cues. Pt is uanble to provide information on home set-up and PLOF due to AMS. Information obtained through chart hx and OT evaluation note. Pt is pleasant but anxious throughout session limiting overall mobility and assessment. Pt consistently states "I miss Jenna Dudley" (spouse", and "I'm afraid." Despite encouragement and support pt denied OOB mobility with frequent attempts of lying down without warning. Pt required MIN A for bed mobility supine > sit secondary to lack of pt participation. Once seated pt required min-A due to posterior lean. Transferred sit <> stand w/ RW, MOD A, w/ increased posterior lean, BLE against bed rails for additional support. SNF is being recommended at this time due high level of assist necessary for ADLs and returning to PLOF. Pending further treatment sessions and pt participation, d/c recommendations will be updated. Skilled PT intervention is indicated to address deficits in function, mobility, and to return to PLOF as able.       Recommendations for follow up therapy are one component of a multi-disciplinary discharge planning process, led by the attending physician.  Recommendations may be updated based on patient status, additional functional criteria and insurance authorization.  Follow Up Recommendations SNF    Equipment Recommendations  Rolling walker with 5" wheels    Recommendations for  Other Services       Precautions / Restrictions Precautions Precautions: Fall Restrictions Weight Bearing Restrictions: No      Mobility  Bed Mobility Overal bed mobility: Needs Assistance Bed Mobility: Supine to Sit;Sit to Supine     Supine to sit: Min assist Sit to supine: Min assist   General bed mobility comments: Pt started transfer without assist and changes her mind, MIN A provided for pt participation; able to scoot to [facility] w/ BLE without assist    Transfers   Equipment used: Rolling walker (2 wheeled) Transfers: Sit to/from Stand Sit to Stand: Mod assist         General transfer comment: Posterior lean with BLE against bed for upright support  Ambulation/Gait             General Gait Details: Pt denied further OOB mobility  Stairs            Wheelchair Mobility    Modified Rankin (Stroke Patients Only)       Balance Overall balance assessment: Needs assistance Sitting-balance support: Feet supported;Bilateral upper extremity supported Sitting balance-Leahy Scale: Poor Sitting balance - Comments: Posterior lean secondary to attempts in return to bed Postural control: Posterior lean   Standing balance-Leahy Scale: Poor Standing balance comment: posterior lean                             Pertinent Vitals/Pain Pain Assessment: No/denies pain    Home Living Family/patient expects to be discharged to:: Private residence Living Arrangements: Spouse/significant other Available Help at Discharge: Family Type of Home: House Home Access: Stairs to enter Entrance Stairs-Rails: Right Entrance Stairs-Number of Steps: 1 Home Layout:  One level   Additional Comments: Pt is a poor historian, home info recieved from OT; attempted contacting pt contacts per chart to confirm, contacts unavailable    Prior Function Level of Independence: Needs assistance   Gait / Transfers Assistance Needed: Pt states she is able to walk to in-room  restroom, no other information obtained due to AMS  ADL's / Homemaking Assistance Needed: Per OT, "Pt reports that she is independent with ADLs and that spouse Jenna Dudley) assists with IADLs"  Comments: Pt is a poor historian, all home set-up/PLOF obtained from OT report and chart hx. Attempted contacting spouse but unable to reach over phone.     Hand Dominance        Extremity/Trunk Assessment   Upper Extremity Assessment Upper Extremity Assessment: Overall WFL for tasks assessed    Lower Extremity Assessment Lower Extremity Assessment: Overall WFL for tasks assessed       Communication   Communication: No difficulties  Cognition Arousal/Alertness: Awake/alert Behavior During Therapy: Anxious Overall Cognitive Status: No family/caregiver present to determine baseline cognitive functioning                                 General Comments: Oriented to self, continues to state "I don't know why I"m here", oriented to spouse; pt intermittently follows one step commands but requires redirection of task and refuses OOB mobility due to fear. Pt encouragement provided to ease fears, but pt continued to perseverate on "missing Jenna Dudley" (spouse).      General Comments General comments (skin integrity, edema, etc.): Pt on 2L O2 [place] w/ SpO2 98%    Exercises     Assessment/Plan    PT Assessment Patient needs continued PT services  PT Problem List Decreased strength;Decreased range of motion;Decreased balance;Decreased mobility;Decreased activity tolerance;Decreased coordination       PT Treatment Interventions Balance training;Gait training;Stair training;Neuromuscular re-education;Functional mobility training;Therapeutic exercise;Therapeutic activities    PT Goals (Current goals can be found in the Care Plan section)  Acute Rehab PT Goals Patient Stated Goal: "to see Jenna Dudley" (pt's spouse) PT Goal Formulation: With patient Time For Goal Achievement: 12/27/20 Potential to  Achieve Goals: Good    Frequency Min 2X/week   Barriers to discharge        Co-evaluation               AM-PAC PT "6 Clicks" Mobility  Outcome Measure Help needed turning from your back to your side while in a flat bed without using bedrails?: A Little Help needed moving from lying on your back to sitting on the side of a flat bed without using bedrails?: A Little Help needed moving to and from a bed to a chair (including a wheelchair)?: A Lot Help needed standing up from a chair using your arms (e.g., wheelchair or bedside chair)?: A Lot Help needed to walk in hospital room?: A Lot Help needed climbing 3-5 steps with a railing? : A Lot 6 Click Score: 14    End of Session Equipment Utilized During Treatment: Gait belt Activity Tolerance: Other (comment) (tx limited by anxiety) Patient left: in bed;with call bell/phone within reach;with bed alarm set Nurse Communication: Mobility status PT Visit Diagnosis: Muscle weakness (generalized) (M62.81);Unsteadiness on feet (R26.81)    Time: 0910-0950 PT Time Calculation (min) (ACUTE ONLY): 40 min   Charges:             [employer], SPT

## 2020-12-13 NOTE — Evaluation (Signed)
Occupational Therapy Evaluation Patient Details Name: Jenna Dudley MRN: [id number] DOB: October 26, 1935 Today's Date: 12/13/2020   History of Present Illness 85 y.o. female with past medical history of chronic diastolic heart failure, bipolar disorder, hypertension, dementia, chronic low back pain presented to the hospital with abnormal CT scan concerning for partial small bowel obstruction (now resolved, managed by laxatives).  Patient also found to have moderate right pleural effusion (with scheduled theoracentesis 9/19)   Clinical Impression   Pt seen for OT evaluation this date. Upon arrival to room, pt awake, seated upright in bed, and 2L of supplemental O2 via [place]. Pt A&Ox3, reporting no pain, and agreeable to OT evaluation. Pt is a questionable historian and PLOF below obtained via pt report this date and previous encounter; plan to confirm with spouse when able. Pt currently presents with anxiety, decreased strength, decreased balance, and decreased activity tolerance. Due to these functional impairments, pt requires SUPERVISION for bed mobility and SUPERVISION for seated UB ADLs. Following initial supine>sit transfer, pt becoming fearful, initiating return to supine, and stating "I am just afraid. I just want my husband here"; this author attempted to ensure pt was safe, however following 2 additional supine<>sit transfers, pt declining further mobility/ADLs. Pt denying pain throughout and left in bed, with all needs within reach, and in no acute distress. Pt would benefit from additional skilled OT services to maximize return to PLOF and minimize risk of future falls, injury, caregiver burden, and readmission. Upon discharge, recommend SNF.        Recommendations for follow up therapy are one component of a multi-disciplinary discharge planning process, led by the attending physician.  Recommendations may be updated based on patient status, additional functional criteria and insurance authorization.    Follow Up Recommendations  SNF    Equipment Recommendations  Other (comment) (defer to next venue of care)       Precautions / Restrictions Precautions Precautions: Fall Restrictions Weight Bearing Restrictions: No      Mobility Bed Mobility Overal bed mobility: Needs Assistance Bed Mobility: Supine to Sit;Sit to Supine     Supine to sit: Supervision;HOB elevated Sit to supine: Supervision;HOB elevated   General bed mobility comments: Able to perform without physical assist. Requires verbal cues for body positioning. Perform x3 supine <>sit transfers     Transfers                 General transfer comment: pt declined further mobility d/t fear    Balance Overall balance assessment: Needs assistance   Sitting balance-Leahy Scale: Fair Sitting balance - Comments: Fair sitting balance at EOB while participating in UB ADLs       Standing balance comment: unable to assess this date                           ADL either performed or assessed with clinical judgement   ADL Overall ADL's : Needs assistance/impaired     Grooming: Wash/dry face;Supervision/safety;Set up;Sitting                                 General ADL Comments: Pt declining to participate in additional ADLs d/t fear     Vision Baseline Vision/History: 1 Wears glasses              Pertinent Vitals/Pain Pain Assessment: No/denies pain        Extremity/Trunk Assessment Upper Extremity  Assessment Upper Extremity Assessment: Generalized weakness   Lower Extremity Assessment Lower Extremity Assessment: Generalized weakness       Communication Communication Communication: HOH   Cognition Arousal/Alertness: Awake/alert Behavior During Therapy: Anxious Overall Cognitive Status: No family/caregiver present to determine baseline cognitive functioning                                 General Comments: Pt alert and oriented to self, place, and  parts of situation. Disoriented to date. Pt initially agreeable to OT eval/functional mobility, however becoming tearful following bed mobility, refusing futher mobility, and stating "I just am afraid, I just want my husband here". Pt denying any pain. This author attemoted to ensure pt was safe, however pt continuing to declined futher mobility   General Comments  Upon arrival to room, pt on 2L of O2 via [place] with SpO2 96%. Following bed mobility, unable to get clear SpO2 reading            Home Living Family/patient expects to be discharged to:: Private residence Living Arrangements: Spouse/significant other Available Help at Discharge: Family Type of Home: House Home Access: Stairs to enter [employer] of Steps: Pt reports 1 step to enter back door, previous admission (3 years ago) reports 4 steps   Home Layout: One level     Bathroom Shower/Tub: Tub/shower unit                    Prior Functioning/Environment Level of Independence: Needs assistance    ADL's / Homemaking Assistance Needed: Pt reports that she is independent with ADLs and that spouse Jenna Dudley) assists with IADLs   Comments: Pt questionable historian. Home set-up/PLOF obtained from pt report this date and previous encounter (3 years ago). Unable to reach spouse over phone. Plan to confirm as able        OT Problem List: Decreased strength;Decreased activity tolerance;Impaired balance (sitting and/or standing);Decreased cognition      OT Treatment/Interventions: Self-care/ADL training;Therapeutic exercise;Energy conservation;DME and/or AE instruction;Therapeutic activities;Patient/family education;Balance training    OT Goals(Current goals can be found in the care plan section) Acute Rehab OT Goals Patient Stated Goal: "to see Jenna Dudley" (pt's spouse) OT Goal Formulation: With patient Time For Goal Achievement: 12/27/20 Potential to Achieve Goals: Good ADL Goals Pt Will Perform Grooming: with min  assist;standing Pt Will Perform Upper Body Dressing: with set-up;with supervision;sitting Pt Will Transfer to Toilet: with min assist;ambulating;bedside commode  OT Frequency: Min 1X/week    AM-PAC OT "6 Clicks" Daily Activity     Outcome Measure Help from another person eating meals?: None Help from another person taking care of personal grooming?: A Little Help from another person toileting, which includes using toliet, bedpan, or urinal?: A Lot Help from another person bathing (including washing, rinsing, drying)?: A Lot Help from another person to put on and taking off regular upper body clothing?: A Little Help from another person to put on and taking off regular lower body clothing?: A Lot 6 Click Score: 16   End of Session Equipment Utilized During Treatment: Oxygen Nurse Communication: Mobility status  Activity Tolerance: Other (comment) (pt limited by anxiety) Patient left: in bed;with call bell/phone within reach;with bed alarm set  OT Visit Diagnosis: Muscle weakness (generalized) (M62.81)                Time: 2831-5176 OT Time Calculation (min): 23 min Charges:  OT General Charges $OT Visit: 1  Visit OT Evaluation $OT Eval Moderate Complexity: 1 Mod OT Treatments $Self Care/Home Management : 8-22 mins  Fredirick Maudlin, OTR/L [place]

## 2020-12-13 NOTE — Progress Notes (Addendum)
[facility] Progress Note  Patient: Jenna Dudley    ZDG:[id number]  DOA: 12/10/2020      Brief hospital course: Jenna Dudley is a 85 y.o. female with past medical history of chronic diastolic heart failure, bipolar disorder, hypertension, dementia, chronic low back pain presented to the hospital with abnormal CT scan concerning for partial small bowel obstruction.   She did have shortness of breath on exertion and orthopnea. In the ED, vitals were stable.  Hemoglobin was 9.5.   BNP elevated at 1480.  EKG showed normal sinus rhythm.  CT scan of the abdomen and pelvis showed dilated loops of bowel in the left upper quadrant with transition point.  Patient also had moderate right pleural effusion new from 11/24/2020.  Patient was given Lasix and patient was admitted hospital for further evaluation and treatment.   Assessment and Plan:   Partial small bowel obstruction, resolved CT scan with transition point.  A no indication for surgical intervention.  Continue laxatives.  Constipation predominant.  Oral diet has been initiated.  Acute urine retention, Present on admission.  Foley catheter was inserted.  Plan for Foley catheter for 7 days and voiding trial as outpatient with outpatient urology follow-up.    Acute on chronic diastolic CHF right-sided pleural effusion CT scan showed right-sided pleural effusion.  Received 1 dose of 60 mg IV Lasix.  Continue metoprolol losartan daily weights, intake and output charting.  2D echocardiogram shows LV ejection fraction of 65%.  Status post ultrasound-guided thoracentesis today with 800 mL of clear fluid.  Cardiology saw the patient and did not recommend ongoing diuretics.  Chronic Anemia Latest hemoglobin of 5.5.    Essential hypertension Continue metoprolol and losartan.     Chronic low back pain Continue Tylenol No. 3     Depression, bipolar disorder Continue venlafaxine, Zyprexa and Remeron     Dementia  Continue delirium precautions    Vitamin D deficiency, started vitamin D 50k units p.o. weekly.  Follow with PCP to repeat vitamin D level after 3 months.  Debility deconditioning chronic back pain.  Patient has been seen by physical therapy recommend skilled nursing facility placement.  TOC has been consulted.  DVT Prophylaxis: Subcutaneous Lovenox   CODE STATUS: Full code  Family Communication:  None.    Disposition:  Skilled nursing facility as per PT evaluation  Subjective:  Today, patient was seen and examined at bedside.  Complains of shortness of breath and chronic low back pain.  Denies any chest pain but has mild cough.  Physical Exam: General:  Average built, not in obvious distress alert awake and oriented. HENT:   No scleral pallor or icterus noted. Oral mucosa is moist.  Chest:    Diminished breath sounds bilaterally.  CVS: S1 &S2 heard.  Murmur noted.  Regular rate and rhythm. Abdomen: Soft, nontender, nondistended.  Bowel sounds are heard.   Extremities: No cyanosis, clubbing or edema.  Peripheral pulses are palpable. Psych: Alert, awake and oriented, normal mood CNS:  No cranial nerve deficits.  Power equal in all extremities.   Skin: Warm and dry.  No rashes noted.   Vitals:   12/13/20 0854 12/13/20 1102 12/13/20 1105 12/13/20 1124  BP: 134/64 (!) 123/59 114/69 (!) 121/50  Pulse: 88 86    Resp: 17     Temp: 97.9 F (36.6 C)     TempSrc: Oral     SpO2: 96% 100%    Weight:      Height:  Intake/Output Summary (Last 24 hours) at 12/13/2020 1143 Last data filed at 12/13/2020 0858 Gross per 24 hour  Intake 360 ml  Output 800 ml  Net -440 ml    Filed Weights   12/10/20 1938 12/13/20 0528  Weight: 51.7 kg 53.8 kg    Data Reviewed:  CBC: Recent Labs  Lab 12/10/20 1958 12/12/20 0703 12/13/20 0549  WBC 4.5 3.5* 3.4*  HGB 9.5* 9.3* 9.5*  HCT 28.5* 27.2* 29.1*  MCV 116.8* 113.8* 113.2*  PLT 217 193 197    Basic Metabolic Panel: Recent Labs  Lab 12/10/20 1958  12/12/20 0703 12/13/20 0549  NA 138 138 138  K 4.5 3.6 3.8  CL 105 100 103  CO2 26 29 31   GLUCOSE 119* 83 85  BUN 23 24* 25*  CREATININE 0.96 0.94 0.90  CALCIUM 8.5* 8.5* 8.3*  MG  --  1.8 1.8  PHOS  --  4.6 4.2     Studies: DG Chest Port 1 View  Result Date: 12/12/2020 CLINICAL DATA:  Short of breath, poor historian EXAM: PORTABLE CHEST 1 VIEW COMPARISON:  None. FINDINGS: Enlarged cardiac silhouette. Central venous congestion. Low lung volumes. No focal consolidation. No pneumothorax IMPRESSION: Cardiomegaly with central venous congestion. Concern for congestive heart failure. Electronically Signed   By: 12/14/2020 M.D.   On: 12/12/2020 13:10    Scheduled Meds:  Chlorhexidine Gluconate Cloth  6 each Topical Daily   enoxaparin (LOVENOX) injection  40 mg Subcutaneous Q24H   losartan  25 mg Oral Daily   metoprolol tartrate  50 mg Oral BID   mineral oil  1 enema Rectal Once   mirtazapine  45 mg Oral QHS   OLANZapine  20 mg Oral QHS   pantoprazole  40 mg Oral Daily   polyethylene glycol  17 g Oral Daily   simvastatin  40 mg Oral QHS   venlafaxine XR  150 mg Oral Q breakfast   vitamin B-12  500 mcg Oral Daily   Vitamin D (Ergocalciferol)  50,000 Units Oral Q7 days   Continuous Infusions: PRN Meds: acetaminophen **OR** acetaminophen, acetaminophen-codeine, ondansetron **OR** ondansetron (ZOFRAN) IV  12/14/2020, MD [facility] 12/13/2020 11:43 AM

## 2020-12-13 NOTE — Care Management Important Message (Signed)
Important Message  Patient Details  Name: Jenna Dudley MRN: [id number] Date of Birth: [birth year]   Medicare Important Message Given:  Yes     [name] 12/13/2020, 4:52 PM

## 2020-12-13 NOTE — Progress Notes (Signed)
Progress Note  Patient Name: Jenna Dudley Date of Encounter: 12/13/2020  Primary Cardiologist: Lorine Bears, MD   Subjective   Working with occupational therapy at the time of my visit. O2 saturations 90s with occasional drops into the 80s. Reports she feels not breathing well.   No CP.  States she wants to lay down in the middle of the exam. Hard of hearing and at times seems confused with consideration of previously reported dementia.  Inpatient Medications    Scheduled Meds:  Chlorhexidine Gluconate Cloth  6 each Topical Daily   enoxaparin (LOVENOX) injection  40 mg Subcutaneous Q24H   losartan  25 mg Oral Daily   metoprolol tartrate  50 mg Oral BID   mineral oil  1 enema Rectal Once   mirtazapine  45 mg Oral QHS   OLANZapine  20 mg Oral QHS   pantoprazole  40 mg Oral Daily   polyethylene glycol  17 g Oral Daily   simvastatin  40 mg Oral QHS   venlafaxine XR  150 mg Oral Q breakfast   vitamin B-12  500 mcg Oral Daily   Vitamin D (Ergocalciferol)  50,000 Units Oral Q7 days   Continuous Infusions:  PRN Meds: acetaminophen **OR** acetaminophen, acetaminophen-codeine, ondansetron **OR** ondansetron (ZOFRAN) IV   Vital Signs    Vitals:   12/12/20 2354 12/13/20 0411 12/13/20 0528 12/13/20 0854  BP: (!) 111/55 (!) 122/44  134/64  Pulse: 76 74  88  Resp: 18 18  17   Temp: 98.2 F (36.8 C) 97.8 F (36.6 C)  97.9 F (36.6 C)  TempSrc:    Oral  SpO2: 99% 96%  96%  Weight:   53.8 kg   Height:        Intake/Output Summary (Last 24 hours) at 12/13/2020 1010 Last data filed at 12/13/2020 0858 Gross per 24 hour  Intake 360 ml  Output 800 ml  Net -440 ml   Last 3 Weights 12/13/2020 12/10/2020 08/03/2020  Weight (lbs) 118 lb 9.7 oz 114 lb 121 lb  Weight (kg) 53.8 kg 51.71 kg 54.885 kg  Some encounter information is confidential and restricted. Go to Review Flowsheets activity to see all data.      Telemetry    NSR with frequent ectopy, earlier documented MAT -  Personally Reviewed  ECG    No new tracings - Personally Reviewed  Physical Exam   GEN: Elderly female, NAD  Neck: Unable to assess JVD given pt unable to follow instruction Cardiac: RRR with extrasystole, 3/6 systolic murmur. No, rubs, or gallops.  Respiratory: Left base crackles (anterior auscultation only given pt will not sit up), reduced R base breath sounds. GI: Soft, nontender, non-distended  MS: moderate to 1+ bilateral LEE; No deformity. Neuro:  At times confused Psych: Normal affect   Labs    High Sensitivity Troponin:  No results for input(s): TROPONINIHS in the last 720 hours.    Chemistry Recent Labs  Lab 12/10/20 1958 12/12/20 0703 12/13/20 0549  NA 138 138 138  K 4.5 3.6 3.8  CL 105 100 103  CO2 26 29 31   GLUCOSE 119* 83 85  BUN 23 24* 25*  CREATININE 0.96 0.94 0.90  CALCIUM 8.5* 8.5* 8.3*  PROT 6.4*  --  5.7*  ALBUMIN 3.7  --  3.3*  AST 19  --  14*  ALT 15  --  10  ALKPHOS 60  --  47  BILITOT 0.7  --  0.8  GFRNONAA 59* >60 >60  ANIONGAP 7 9 4*     Hematology Recent Labs  Lab 12/10/20 1958 12/12/20 0703 12/13/20 0549  WBC 4.5 3.5* 3.4*  RBC 2.44* 2.39* 2.57*  HGB 9.5* 9.3* 9.5*  HCT 28.5* 27.2* 29.1*  MCV 116.8* 113.8* 113.2*  MCH 38.9* 38.9* 37.0*  MCHC 33.3 34.2 32.6  RDW 14.6 14.4 14.1  PLT 217 193 197    BNP Recent Labs  Lab 12/10/20 1958  BNP 1,480.5*     DDimer No results for input(s): DDIMER in the last 168 hours.   Radiology    CT ABDOMEN PELVIS W CONTRAST  Result Date: 12/11/2020 CLINICAL DATA:  Abdominal distention EXAM: CT ABDOMEN AND PELVIS WITH CONTRAST TECHNIQUE: Multidetector CT imaging of the abdomen and pelvis was performed using the standard protocol following bolus administration of intravenous contrast. CONTRAST:  33mL OMNIPAQUE IOHEXOL 350 MG/ML SOLN COMPARISON:  CT abdomen/pelvis 12/20/2017 FINDINGS: Lower chest: There is a moderate size right pleural effusion with adjacent consolidation. The heart is  mildly enlarged. Coronary artery calcifications, aortic valve calcifications, and mitral annular calcifications are noted. There is no pericardial effusion. Hepatobiliary: The liver is heterogeneously enhancing on the early phase images but homogeneous on the delayed phase, suggesting perfusional alterations. There are no focal parenchymal lesions. The gallbladder is surgically absent. There is new moderate intra and extrahepatic biliary ductal dilatation with the common bile duct measuring up to 1.1 cm. No radiopaque stone or other obstructing lesion is seen. Pancreas: The pancreas is atrophic but otherwise unremarkable. There is no focal lesion or contour abnormality. There is no main pancreatic ductal dilatation or peripancreatic inflammatory change. Spleen: Unremarkable. Adrenals/Urinary Tract: The adrenals are mildly thickened bilaterally with no focal lesion. The kidneys enhance symmetrically with symmetric excretion of contrast into the collecting system on the delayed phase images. There is no focal lesion or stone. There is no hydronephrosis or hydroureter. There is mild perinephric stranding bilaterally, nonspecific. The bladder is decompressed by a Foley catheter. Stomach/Bowel: There is a small hiatal hernia. The stomach is otherwise unremarkable. There is no evidence of bowel obstruction. There is a moderate stool burden throughout the colon. There is no abnormal bowel wall thickening or inflammatory change. Vascular/Lymphatic: There is extensive calcified atherosclerotic plaque throughout the nonaneurysmal abdominal aorta. The major branch vessels are patent. The main portal and splenic veins are patent. Reproductive: The uterus is surgically absent. There is no adnexal mass. Other: There is trace free fluid in the presacral space, nonspecific. There is no organized or drainable fluid collection. There is no free air. Surgical clips are noted along the right ventral abdominal wall. Musculoskeletal:  There is a subacute fracture of the right L2 transverse process, new since 12/20/2017. There is advanced multilevel degenerative change throughout the spine and of the hips, right worse than left. There is mild compression deformity of the L1 vertebral body, similar to the prior CT. IMPRESSION: 1. Moderate intra and extrahepatic biliary ductal dilatation is increased since 12/20/2017. No focal lesion or stone is seen. This could be further evaluated with MRI/MRCP (this could be performed on an outpatient basis depending on level of clinical concern). 2. Moderate colonic stool burden without evidence of mechanical obstruction. 3. Moderate-size right pleural effusion. Consider dedicated imaging of the chest as indicated. 4. Mild cardiomegaly. Dense coronary artery calcifications, aortic valve calcifications, and mild mitral annular calcifications. 5. Trace free fluid in the presacral space, nonspecific. 6. Subacute appearing fracture of the right L2 transverse process. Aortic Atherosclerosis (ICD10-I70.0). Electronically Signed   By:  Lesia Hausen M.D.   On: 12/11/2020 13:54   DG Chest Port 1 View  Result Date: 12/12/2020 CLINICAL DATA:  Short of breath, poor historian EXAM: PORTABLE CHEST 1 VIEW COMPARISON:  None. FINDINGS: Enlarged cardiac silhouette. Central venous congestion. Low lung volumes. No focal consolidation. No pneumothorax IMPRESSION: Cardiomegaly with central venous congestion. Concern for congestive heart failure. Electronically Signed   By: Genevive Bi M.D.   On: 12/12/2020 13:10   ECHOCARDIOGRAM COMPLETE  Result Date: 12/11/2020    ECHOCARDIOGRAM REPORT   Patient Name:   AVENELL SELLERS Date of Exam: 12/11/2020 Medical Rec #:  [id number]     Height:       60.0 in Accession #:    [id number]    Weight:       114.0 lb Date of Birth:  [birth year]     BSA:          1.470 m Patient Age:    85 years      BP:           110/50 mmHg Patient Gender: F             HR:           59 bpm. Exam Location:   [facility] Procedure: 2D Echo, Cardiac Doppler and Color Doppler Indications:     CHF-Acute Diastolic I50.31  History:         Patient has prior history of Echocardiogram examinations. CHF;                  Risk Factors:Hypertension.  Sonographer:     Neysa Bonito Roar Referring Phys:  [id number] Andris Baumann Diagnosing Phys: Julien Nordmann MD IMPRESSIONS  1. Left ventricular ejection fraction, by estimation, is 60 to 65%. The left ventricle has normal function. The left ventricle has no regional wall motion abnormalities. There is mmoderate to severe left ventricular hypertrophy with near cavity obliteration in systole. Left ventricular diastolic parameters are consistent with Grade III diastolic dysfunction (restrictive).  2. Right ventricular systolic function is normal. The right ventricular size is normal. There is severely elevated pulmonary artery systolic pressure. The estimated right ventricular systolic pressure is 61.9 mmHg.  3. Left atrial size was mild to moderately dilated.  4. The mitral valve is normal in structure. Mild to moderate mitral valve regurgitation.  5. Tricuspid valve regurgitation is moderate.  6. The inferior vena cava is dilated in size with >50% respiratory variability, suggesting right atrial pressure of 8 mmHg. FINDINGS  Left Ventricle: Left ventricular ejection fraction, by estimation, is 60 to 65%. The left ventricle has normal function. The left ventricle has no regional wall motion abnormalities. The left ventricular internal cavity size was normal in size. There is  severe left ventricular hypertrophy. Left ventricular diastolic parameters are consistent with Grade III diastolic dysfunction (restrictive). Right Ventricle: The right ventricular size is normal. No increase in right ventricular wall thickness. Right ventricular systolic function is normal. There is severely elevated pulmonary artery systolic pressure. The tricuspid regurgitant velocity is 3.77 m/s, and with an assumed right  atrial pressure of 5 mmHg, the estimated right ventricular systolic pressure is 61.9 mmHg. Left Atrium: Left atrial size was mild to moderately dilated. Right Atrium: Right atrial size was normal in size. Pericardium: There is no evidence of pericardial effusion. Mitral Valve: The mitral valve is normal in structure. Mild to moderate mitral valve regurgitation. No evidence of mitral valve stenosis. Tricuspid Valve: The tricuspid valve is normal in structure. Tricuspid  valve regurgitation is moderate . No evidence of tricuspid stenosis. Aortic Valve: The aortic valve is normal in structure. Aortic valve regurgitation is not visualized. Mild aortic valve sclerosis is present, with no evidence of aortic valve stenosis. Aortic valve peak gradient measures 14.5 mmHg. Pulmonic Valve: The pulmonic valve was normal in structure. Pulmonic valve regurgitation is not visualized. No evidence of pulmonic stenosis. Aorta: The aortic root is normal in size and structure. Venous: The inferior vena cava is dilated in size with greater than 50% respiratory variability, suggesting right atrial pressure of 8 mmHg. IAS/Shunts: No atrial level shunt detected by color flow Doppler.  LEFT VENTRICLE PLAX 2D LVIDd:         3.70 cm  Diastology LVIDs:         2.50 cm  LV e' medial:    4.35 cm/s LV PW:         1.30 cm  LV E/e' medial:  30.1 LV IVS:        1.90 cm  LV e' lateral:   3.83 cm/s LVOT diam:     1.50 cm  LV E/e' lateral: 34.2 LVOT Area:     1.77 cm  RIGHT VENTRICLE RV Basal diam:  2.60 cm RV Mid diam:    2.30 cm RV S prime:     9.60 cm/s TAPSE (M-mode): 1.7 cm LEFT ATRIUM             Index       RIGHT ATRIUM           Index LA diam:        4.50 cm 3.06 cm/m  RA Area:     15.10 cm LA Vol (A2C):   81.6 ml 55.51 ml/m RA Volume:   37.00 ml  25.17 ml/m LA Vol (A4C):   72.3 ml 49.19 ml/m LA Biplane Vol: 77.6 ml 52.79 ml/m  AORTIC VALVE                PULMONIC VALVE AV Area (Vmax): 0.81 cm    PV Vmax:        0.91 m/s AV Vmax:         190.50 cm/s PV Peak grad:   3.3 mmHg AV Peak Grad:   14.5 mmHg   RVOT Peak grad: 2 mmHg LVOT Vmax:      87.30 cm/s  AORTA Ao Root diam: 2.40 cm MITRAL VALVE                TRICUSPID VALVE MV Area (PHT): 4.36 cm     TR Peak grad:   56.9 mmHg MV Decel Time: 174 msec     TR Vmax:        377.00 cm/s MV E velocity: 131.00 cm/s MV A velocity: 72.30 cm/s   SHUNTS MV E/A ratio:  1.81         Systemic Diam: 1.50 cm MV A Prime:    4.0 cm/s Julien Nordmann MD Electronically signed by Julien Nordmann MD Signature Date/Time: 12/11/2020/3:06:26 PM    Final     Cardiac Studies   Echo 12/11/20  1. Left ventricular ejection fraction, by estimation, is 60 to 65%. The  left ventricle has normal function. The left ventricle has no regional  wall motion abnormalities. There is mmoderate to severe left ventricular  hypertrophy with near cavity  obliteration in systole. Left ventricular diastolic parameters are  consistent with Grade III diastolic dysfunction (restrictive).   2. Right ventricular systolic function is normal. The right  ventricular  size is normal. There is severely elevated pulmonary artery systolic  pressure. The estimated right ventricular systolic pressure is 61.9 mmHg.   3. Left atrial size was mild to moderately dilated.   4. The mitral valve is normal in structure. Mild to moderate mitral valve  regurgitation.   5. Tricuspid valve regurgitation is moderate.   6. The inferior vena cava is dilated in size with >50% respiratory  variability, suggesting right atrial pressure of 8 mmHg.   Patient Profile     85 y.o. female with history of chronic diastolic heart failure, bipolar disorder requiring regular ECT treatments, hypertension, hyperlipidemia, GERD, chronic low back pain, early dementia, and who is seen today for diastolic heart failure.  Assessment & Plan    Acute on Chronic diastolic heart failure Pulmonary Hypertension --Presented with abdominal pain.  BNP 1400.  On 9/17, cardiology  consulted with recommendation to hold further diuresis, which has continued to be held since that time given concern for dehydration given n.p.o. status in the setting of partial small bowel obstruction.  Echo 9/17 EF 60 to 65%, severe LVH with near cavity obliteration in systole, G3 DD, PASP 61.9 mmHg, mild to moderate LAE, mild to moderate MR, moderate TR, RAP 8 mmHg. Net -1.6L yesterday and -1.4L today. Wt 114lbs  118.61lbs. Cr stable with K 3.8.  Chest x-ray 9/18 with concern for CHF.  Scheduled for a thoracentesis today, given right pleural effusion.  Recommend restart of diuresis after thoracentesis. Of note, close monitoring of Cr recommended with restart given severe PAH. Home medications did include Lasix 20 mg daily. Given her echo findings as above including severe LVH and PAH, consider referral to advanced HF clinic at discharge.   MAT  --Maintaining NSR with ectopy. Continue current metoprolol and to monitor on telemetry.  Maintain electrolytes at goal. K 3.8.  HTN --Continue current losartan, metoprolol.  HLD --Continue statin.  Anemia, likely of chronic disease --Continue to monitor with daily CBC. Consider as contributing to her SOB.  For questions or updates, please contact [facility] Please consult [email or web address] for contact info under        Signed, Lennon Alstrom, PA-C  12/13/2020, 10:10 AM

## 2020-12-14 DIAGNOSIS — I10 Essential (primary) hypertension: Secondary | ICD-10-CM | POA: Diagnosis not present

## 2020-12-14 DIAGNOSIS — F0390 Unspecified dementia without behavioral disturbance: Secondary | ICD-10-CM | POA: Diagnosis not present

## 2020-12-14 DIAGNOSIS — M545 Low back pain, unspecified: Secondary | ICD-10-CM | POA: Diagnosis not present

## 2020-12-14 DIAGNOSIS — I5033 Acute on chronic diastolic (congestive) heart failure: Secondary | ICD-10-CM | POA: Diagnosis not present

## 2020-12-14 LAB — CBC
HCT: 28.8 % — ABNORMAL LOW (ref 36.0–46.0)
Hemoglobin: 9.6 g/dL — ABNORMAL LOW (ref 12.0–15.0)
MCH: 37.5 pg — ABNORMAL HIGH (ref 26.0–34.0)
MCHC: 33.3 g/dL (ref 30.0–36.0)
MCV: 112.5 fL — ABNORMAL HIGH (ref 80.0–100.0)
Platelets: 184 10*3/uL (ref 150–400)
RBC: 2.56 MIL/uL — ABNORMAL LOW (ref 3.87–5.11)
RDW: 13.8 % (ref 11.5–15.5)
WBC: 3.8 10*3/uL — ABNORMAL LOW (ref 4.0–10.5)
nRBC: 0 % (ref 0.0–0.2)

## 2020-12-14 LAB — BASIC METABOLIC PANEL
Anion gap: 4 — ABNORMAL LOW (ref 5–15)
BUN: 19 mg/dL (ref 8–23)
CO2: 31 mmol/L (ref 22–32)
Calcium: 8.4 mg/dL — ABNORMAL LOW (ref 8.9–10.3)
Chloride: 102 mmol/L (ref 98–111)
Creatinine, Ser: 0.78 mg/dL (ref 0.44–1.00)
GFR, Estimated: >60 mL/min (ref >60)
Glucose, Bld: 97 mg/dL (ref 70–99)
Potassium: 4 mmol/L (ref 3.5–5.1)
Sodium: 137 mmol/L (ref 135–145)

## 2020-12-14 LAB — PROTEIN, BODY FLUID (OTHER): Total Protein, Body Fluid Other: 2 g/dL

## 2020-12-14 LAB — GLUCOSE, CAPILLARY: Glucose-Capillary: 88 mg/dL (ref 70–99)

## 2020-12-14 LAB — MAGNESIUM: Magnesium: 1.9 mg/dL (ref 1.7–2.4)

## 2020-12-14 LAB — PHOSPHORUS: Phosphorus: 4.1 mg/dL (ref 2.5–4.6)

## 2020-12-14 MED ORDER — FUROSEMIDE 40 MG PO TABS
40.0000 mg | ORAL_TABLET | Freq: Every day | ORAL | Status: DC
  Administered 2020-12-14 – 2020-12-16 (×3): 40 mg via ORAL
  Filled 2020-12-14 (×3): qty 1

## 2020-12-14 NOTE — Progress Notes (Signed)
[facility] Progress Note  Patient: Jenna Dudley    YQM:[id number]  DOA: 12/10/2020      Brief hospital course: Jenna Dudley is a 85 y.o. female with past medical history of chronic diastolic heart failure, bipolar disorder, hypertension, dementia, chronic low back pain presented to the hospital with abnormal CT scan concerning for partial small bowel obstruction.   She did have shortness of breath on exertion and orthopnea. In the ED, vitals were stable.  Hemoglobin was 9.5.   BNP elevated at 1480.  EKG showed normal sinus rhythm.  CT scan of the abdomen and pelvis showed dilated loops of bowel in the left upper quadrant with transition point.  Patient also had moderate right pleural effusion new from 11/24/2020.  Patient was given Lasix and patient was admitted hospital for further evaluation and treatment.   Assessment and Plan:   Partial small bowel obstruction, resolved CT scan with transition point.  A no indication for surgical intervention.  Continue laxatives.  Constipation predominant.  Tolerating oral diet.  Acute urine retention, Present on admission.  Foley catheter was inserted.  Plan for Foley catheter for 7 days and voiding trial as outpatient with outpatient urology follow-up.    Acute on chronic diastolic CHF right-sided pleural effusion CT scan showed right-sided pleural effusion.    Continue metoprolol losartan daily weights, intake and output charting.  2D echocardiogram shows LV ejection fraction of 65%.  Status post ultrasound-guided thoracentesis t on 06/12/2020 with 800 mL of clear fluid.  Is been started on oral Lasix starting today.  Chronic Anemia Latest hemoglobin of 9.6, will continue to monitor.    Essential hypertension Continue metoprolol and losartan.  Blood pressure is stable.     Chronic low back pain Continue analgesics.     Depression, bipolar disorder Continue venlafaxine, Zyprexa and Remeron     Dementia  Continue delirium precautions    Vitamin D deficiency, started vitamin D 50k units p.o. weekly.  Follow with PCP to repeat vitamin D level after 3 months.  Debility deconditioning chronic back pain.  Patient has been seen by physical therapy recommend skilled nursing facility placement.  TOC has been consulted.  DVT Prophylaxis: Subcutaneous Lovenox   CODE STATUS: Full code  Family Communication:  I was unable to reach the patient's spouse on the phone but he is thinking about taking her home when ready as per the nursing staff.   Disposition:  Skilled nursing facility as per PT evaluation but likely home with home health  Subjective:  Today, patient was seen and examined at bedside.  Complains of shortness of breath and chronic low back pain.  Denies any chest pain but has mild cough.  Physical Exam: General:  Average built, not in obvious distress alert awake and oriented.  Nasal cannula oxygen. HENT:   No scleral pallor or icterus noted. Oral mucosa is moist.  Chest:    Diminished breath sounds bilaterally.  CVS: S1 &S2 heard.  Murmur noted.  Regular rate and rhythm. Abdomen: Soft, nontender, nondistended.  Bowel sounds are heard.   Extremities: No cyanosis, clubbing or edema.  Peripheral pulses are palpable. Psych: Alert, awake and oriented, normal mood CNS:  No cranial nerve deficits.  Power equal in all extremities.   Skin: Warm and dry.  No rashes noted.   Vitals:   12/14/20 0356 12/14/20 0400 12/14/20 0926 12/14/20 1145  BP: (!) 116/56  (!) 121/58 131/61  Pulse: 70  82 73  Resp: 19  17 18  Temp: 98 F (36.7 C)  97.6 F (36.4 C) (!) 97.5 F (36.4 C)  TempSrc: Oral     SpO2: 100%  96% 100%  Weight:  59.1 kg 51.3 kg   Height:        Intake/Output Summary (Last 24 hours) at 12/14/2020 1424 Last data filed at 12/14/2020 1350 Gross per 24 hour  Intake 720 ml  Output 1400 ml  Net -680 ml    Filed Weights   12/13/20 0528 12/14/20 0400 12/14/20 0926  Weight: 53.8 kg 59.1 kg 51.3 kg    Data  Reviewed:  CBC: Recent Labs  Lab 12/10/20 1958 12/12/20 0703 12/13/20 0549 12/14/20 0449  WBC 4.5 3.5* 3.4* 3.8*  HGB 9.5* 9.3* 9.5* 9.6*  HCT 28.5* 27.2* 29.1* 28.8*  MCV 116.8* 113.8* 113.2* 112.5*  PLT 217 193 197 184    Basic Metabolic Panel: Recent Labs  Lab 12/10/20 1958 12/12/20 0703 12/13/20 0549 12/14/20 0449  NA 138 138 138 137  K 4.5 3.6 3.8 4.0  CL 105 100 103 102  CO2 26 29 31 31   GLUCOSE 119* 83 85 97  BUN 23 24* 25* 19  CREATININE 0.96 0.94 0.90 0.78  CALCIUM 8.5* 8.5* 8.3* 8.4*  MG  --  1.8 1.8 1.9  PHOS  --  4.6 4.2 4.1     Studies: No results found.  Scheduled Meds:  Chlorhexidine Gluconate Cloth  6 each Topical Daily   COVID-19 mRNA bivalent vaccine (Pfizer)  0.3 mL Intramuscular ONCE-1600   enoxaparin (LOVENOX) injection  40 mg Subcutaneous Q24H   furosemide  40 mg Oral Daily   influenza vaccine adjuvanted  0.5 mL Intramuscular Tomorrow-1000   losartan  25 mg Oral Daily   metoprolol tartrate  50 mg Oral BID   mineral oil  1 enema Rectal Once   mirtazapine  45 mg Oral QHS   OLANZapine  20 mg Oral QHS   pantoprazole  40 mg Oral Daily   polyethylene glycol  17 g Oral Daily   simvastatin  40 mg Oral QHS   venlafaxine XR  150 mg Oral Q breakfast   vitamin B-12  500 mcg Oral Daily   Vitamin D (Ergocalciferol)  50,000 Units Oral Q7 days   Continuous Infusions: PRN Meds: acetaminophen **OR** acetaminophen, acetaminophen-codeine, ondansetron **OR** ondansetron (ZOFRAN) IV  , MD [facility] 12/14/2020 2:24 PM

## 2020-12-14 NOTE — Progress Notes (Signed)
Progress Note  Patient Name: Jenna Dudley Date of Encounter: 12/14/2020  Primary Cardiologist: Lorine Bears, MD   Subjective   No CP or SOB with consideration of dementia at baseline. S/p thoracentesis as below.  Worked with OT yesterday.   Inpatient Medications    Scheduled Meds:  Chlorhexidine Gluconate Cloth  6 each Topical Daily   COVID-19 mRNA bivalent vaccine (Pfizer)  0.3 mL Intramuscular ONCE-1600   enoxaparin (LOVENOX) injection  40 mg Subcutaneous Q24H   furosemide  40 mg Oral Daily   influenza vaccine adjuvanted  0.5 mL Intramuscular Tomorrow-1000   losartan  25 mg Oral Daily   metoprolol tartrate  50 mg Oral BID   mineral oil  1 enema Rectal Once   mirtazapine  45 mg Oral QHS   OLANZapine  20 mg Oral QHS   pantoprazole  40 mg Oral Daily   polyethylene glycol  17 g Oral Daily   simvastatin  40 mg Oral QHS   venlafaxine XR  150 mg Oral Q breakfast   vitamin B-12  500 mcg Oral Daily   Vitamin D (Ergocalciferol)  50,000 Units Oral Q7 days   Continuous Infusions:  PRN Meds: acetaminophen **OR** acetaminophen, acetaminophen-codeine, ondansetron **OR** ondansetron (ZOFRAN) IV   Vital Signs    Vitals:   12/14/20 0356 12/14/20 0400 12/14/20 0926 12/14/20 1145  BP: (!) 116/56  (!) 121/58 131/61  Pulse: 70  82 73  Resp: 19  17 18   Temp: 98 F (36.7 C)  97.6 F (36.4 C) (!) 97.5 F (36.4 C)  TempSrc: Oral     SpO2: 100%  96% 100%  Weight:  59.1 kg 51.3 kg   Height:        Intake/Output Summary (Last 24 hours) at 12/14/2020 1203 Last data filed at 12/14/2020 0900 Gross per 24 hour  Intake 720 ml  Output 400 ml  Net 320 ml    Last 3 Weights 12/14/2020 12/14/2020 12/13/2020  Weight (lbs) 113 lb 1.5 oz 130 lb 4.7 oz 118 lb 9.7 oz  Weight (kg) 51.3 kg 59.1 kg 53.8 kg  Some encounter information is confidential and restricted. Go to Review Flowsheets activity to see all data.      Telemetry    NSR with frequent ectopy/PVCs 60-70s, earlier documented  MAT - Personally Reviewed  ECG    No new tracings - Personally Reviewed  Physical Exam   GEN: Elderly female, NAD  Neck: Unable to assess JVD - pt unable to follow instruction Cardiac: RRR with extrasystole, 3/6 systolic murmur. No, rubs, or gallops.  Respiratory: Reduced breath sounds bilaterally, very poor inspiratory effort during lung exam GI: Soft, nontender, non-distended  MS: moderate bilateral LEE; No deformity. Neuro:  At times confused Psych: Normal affect   Labs    High Sensitivity Troponin:  No results for input(s): TROPONINIHS in the last 720 hours.    Chemistry Recent Labs  Lab 12/10/20 1958 12/12/20 0703 12/13/20 0549 12/14/20 0449  NA 138 138 138 137  K 4.5 3.6 3.8 4.0  CL 105 100 103 102  CO2 26 29 31 31   GLUCOSE 119* 83 85 97  BUN 23 24* 25* 19  CREATININE 0.96 0.94 0.90 0.78  CALCIUM 8.5* 8.5* 8.3* 8.4*  PROT 6.4*  --  5.7*  --   ALBUMIN 3.7  --  3.3*  --   AST 19  --  14*  --   ALT 15  --  10  --   ALKPHOS 60  --  47  --   BILITOT 0.7  --  0.8  --   GFRNONAA 59* >60 >60 >60  ANIONGAP 7 9 4* 4*      Hematology Recent Labs  Lab 12/12/20 0703 12/13/20 0549 12/14/20 0449  WBC 3.5* 3.4* 3.8*  RBC 2.39* 2.57* 2.56*  HGB 9.3* 9.5* 9.6*  HCT 27.2* 29.1* 28.8*  MCV 113.8* 113.2* 112.5*  MCH 38.9* 37.0* 37.5*  MCHC 34.2 32.6 33.3  RDW 14.4 14.1 13.8  PLT 193 197 184     BNP Recent Labs  Lab 12/10/20 1958  BNP 1,480.5*      DDimer No results for input(s): DDIMER in the last 168 hours.   Radiology    DG Chest 1 View  Result Date: 12/13/2020 CLINICAL DATA:  Status post right thoracentesis. EXAM: CHEST  1 VIEW COMPARISON:  December 12, 2020. FINDINGS: Stable cardiomegaly. No pneumothorax is noted. No significant pleural effusion is seen currently. The visualized skeletal structures are unremarkable. IMPRESSION: No pneumothorax is noted. No significant pleural effusion is seen currently. Aortic Atherosclerosis (ICD10-I70.0).  Electronically Signed   By: Lupita Raider M.D.   On: 12/13/2020 12:01   DG Chest Port 1 View  Result Date: 12/12/2020 CLINICAL DATA:  Short of breath, poor historian EXAM: PORTABLE CHEST 1 VIEW COMPARISON:  None. FINDINGS: Enlarged cardiac silhouette. Central venous congestion. Low lung volumes. No focal consolidation. No pneumothorax IMPRESSION: Cardiomegaly with central venous congestion. Concern for congestive heart failure. Electronically Signed   By: Genevive Bi M.D.   On: 12/12/2020 13:10   US THORACENTESIS ASP PLEURAL SPACE W/IMG GUIDE  Result Date: 12/13/2020 INDICATION: Patient with a history of heart failure presents today with right pleural effusion. Interventional radiology asked to perform a diagnostic and therapeutic thoracentesis. EXAM: ULTRASOUND GUIDED THORACENTESIS MEDICATIONS: % lidocaine 10 mL COMPLICATIONS: None immediate. PROCEDURE: An ultrasound guided thoracentesis was thoroughly discussed with the patient and questions answered. The benefits, risks, alternatives and complications were also discussed. The patient understands and wishes to proceed with the procedure. Written consent was obtained. Ultrasound was performed to localize and mark an adequate pocket of fluid in the right chest. The area was then prepped and draped in the normal sterile fashion. 1% Lidocaine was used for local anesthesia. Under ultrasound guidance a 6 Fr Safe-T-Centesis catheter was introduced. Thoracentesis was performed. The catheter was removed and a dressing applied. FINDINGS: A total of approximately 800 mL of clear yellow fluid was removed. Samples were sent to the laboratory as requested by the clinical team. IMPRESSION: Successful ultrasound guided right thoracentesis yielding 800 mL of pleural fluid. Read by: Alwyn Ren, NP Electronically Signed   By: Simonne Come M.D.   On: 12/13/2020 12:18    Cardiac Studies   Echo 12/11/20  1. Left ventricular ejection fraction, by estimation, is  60 to 65%. The  left ventricle has normal function. The left ventricle has no regional  wall motion abnormalities. There is mmoderate to severe left ventricular  hypertrophy with near cavity  obliteration in systole. Left ventricular diastolic parameters are  consistent with Grade III diastolic dysfunction (restrictive).   2. Right ventricular systolic function is normal. The right ventricular  size is normal. There is severely elevated pulmonary artery systolic  pressure. The estimated right ventricular systolic pressure is 61.9 mmHg.   3. Left atrial size was mild to moderately dilated.   4. The mitral valve is normal in structure. Mild to moderate mitral valve  regurgitation.   5. Tricuspid valve regurgitation  is moderate.   6. The inferior vena cava is dilated in size with >50% respiratory  variability, suggesting right atrial pressure of 8 mmHg.   Patient Profile     85 y.o. female with history of chronic diastolic heart failure, bipolar disorder requiring regular ECT treatments, hypertension, hyperlipidemia, GERD, chronic low back pain, early dementia, and who is seen today for diastolic heart failure.  Assessment & Plan    Acute on Chronic diastolic heart failure Pulmonary Hypertension --Presented with abdominal pain and diuresis held given n.p.o. status in the setting of partial small bowel obstruction.  Echo 9/17 EF 60 to 65%, severe LVH with near cavity obliteration in systole, G3 DD, PASP 61.9 mmHg, mild to moderate LAE, mild to moderate MR, moderate TR, RAP 8 mmHg. S/p thoracentesis yesterday with 800cc of fluid and with improvement in breathing. Output Net -1.6L yesterday and -1.4L today. Unclear if wt accurate. Cr stable with K at goal.  PTA oral lasix restarted this AM as no longer NPO given improvement in bowel obstruction and diuresis restarted at increased dose Lasix 40 mg daily. Output 3.3L yesterday and -1.9 for the admission. Close follow-up in office recommended. As  previously noted, no plan for cath this admission given age and dementia.  MAT  --Maintaining NSR with ectopy. Continue current metoprolol and to monitor on telemetry.  Maintain electrolytes at goal.   HTN --Continue current losartan, metoprolol.  HLD --Continue statin.  Anemia, likely of chronic disease --Continue to monitor with daily CBC. Consider as contributing to her SOB.  For questions or updates, please contact [facility] Please consult [email or web address] for contact info under        Signed, Lennon Alstrom, PA-C  12/14/2020, 12:03 PM

## 2020-12-15 DIAGNOSIS — I10 Essential (primary) hypertension: Secondary | ICD-10-CM | POA: Diagnosis not present

## 2020-12-15 DIAGNOSIS — I5033 Acute on chronic diastolic (congestive) heart failure: Secondary | ICD-10-CM | POA: Diagnosis not present

## 2020-12-15 DIAGNOSIS — M545 Low back pain, unspecified: Secondary | ICD-10-CM | POA: Diagnosis not present

## 2020-12-15 DIAGNOSIS — F0390 Unspecified dementia without behavioral disturbance: Secondary | ICD-10-CM | POA: Diagnosis not present

## 2020-12-15 LAB — CBC
HCT: 34.5 % — ABNORMAL LOW (ref 36.0–46.0)
Hemoglobin: 11.6 g/dL — ABNORMAL LOW (ref 12.0–15.0)
MCH: 37.5 pg — ABNORMAL HIGH (ref 26.0–34.0)
MCHC: 33.6 g/dL (ref 30.0–36.0)
MCV: 111.7 fL — ABNORMAL HIGH (ref 80.0–100.0)
Platelets: 217 10*3/uL (ref 150–400)
RBC: 3.09 MIL/uL — ABNORMAL LOW (ref 3.87–5.11)
RDW: 13.8 % (ref 11.5–15.5)
WBC: 6.6 10*3/uL (ref 4.0–10.5)
nRBC: 0 % (ref 0.0–0.2)

## 2020-12-15 LAB — BASIC METABOLIC PANEL
Anion gap: 10 (ref 5–15)
BUN: 17 mg/dL (ref 8–23)
CO2: 30 mmol/L (ref 22–32)
Calcium: 9.1 mg/dL (ref 8.9–10.3)
Chloride: 96 mmol/L — ABNORMAL LOW (ref 98–111)
Creatinine, Ser: 0.86 mg/dL (ref 0.44–1.00)
GFR, Estimated: >60 mL/min (ref >60)
Glucose, Bld: 97 mg/dL (ref 70–99)
Potassium: 4.7 mmol/L (ref 3.5–5.1)
Sodium: 136 mmol/L (ref 135–145)

## 2020-12-15 LAB — BRAIN NATRIURETIC PEPTIDE: B Natriuretic Peptide: 927.3 pg/mL — ABNORMAL HIGH (ref 0.0–100.0)

## 2020-12-15 MED ORDER — SENNOSIDES-DOCUSATE SODIUM 8.6-50 MG PO TABS
1.0000 | ORAL_TABLET | Freq: Two times a day (BID) | ORAL | Status: DC
  Administered 2020-12-15 – 2020-12-17 (×5): 1 via ORAL
  Filled 2020-12-15 (×5): qty 1

## 2020-12-15 MED ORDER — METOPROLOL TARTRATE 50 MG PO TABS
100.0000 mg | ORAL_TABLET | Freq: Two times a day (BID) | ORAL | Status: DC
  Administered 2020-12-15 – 2020-12-17 (×4): 100 mg via ORAL
  Filled 2020-12-15 (×4): qty 2

## 2020-12-15 MED ORDER — BISACODYL 10 MG RE SUPP
10.0000 mg | Freq: Once | RECTAL | Status: AC
  Administered 2020-12-15: 10 mg via RECTAL
  Filled 2020-12-15: qty 1

## 2020-12-15 MED ORDER — POLYETHYLENE GLYCOL 3350 17 G PO PACK
17.0000 g | PACK | Freq: Two times a day (BID) | ORAL | Status: DC
  Administered 2020-12-15 – 2020-12-17 (×4): 17 g via ORAL
  Filled 2020-12-15 (×4): qty 1

## 2020-12-15 NOTE — Progress Notes (Signed)
Progress Note  Patient Name: Jenna Dudley Date of Encounter: 12/15/2020  Primary Cardiologist: Lorine Bears, MD   Subjective   Still reports no CP or SOB with consideration of dementia at baseline.  Inpatient Medications    Scheduled Meds:  Chlorhexidine Gluconate Cloth  6 each Topical Daily   enoxaparin (LOVENOX) injection  40 mg Subcutaneous Q24H   furosemide  40 mg Oral Daily   influenza vaccine adjuvanted  0.5 mL Intramuscular Tomorrow-1000   losartan  25 mg Oral Daily   metoprolol tartrate  50 mg Oral BID   mineral oil  1 enema Rectal Once   mirtazapine  45 mg Oral QHS   OLANZapine  20 mg Oral QHS   pantoprazole  40 mg Oral Daily   polyethylene glycol  17 g Oral Daily   simvastatin  40 mg Oral QHS   venlafaxine XR  150 mg Oral Q breakfast   vitamin B-12  500 mcg Oral Daily   Vitamin D (Ergocalciferol)  50,000 Units Oral Q7 days   Continuous Infusions:  PRN Meds: acetaminophen **OR** acetaminophen, acetaminophen-codeine, ondansetron **OR** ondansetron (ZOFRAN) IV   Vital Signs    Vitals:   12/14/20 2313 12/15/20 0408 12/15/20 0409 12/15/20 0904  BP: (!) 163/64 (!) 147/73  (!) 147/65  Pulse: 88 84  89  Resp: 17 17  17   Temp: 99.6 F (37.6 C) 98.4 F (36.9 C)  99.2 F (37.3 C)  TempSrc: Oral Oral    SpO2: 92% 96%  94%  Weight:   51.1 kg   Height:        Intake/Output Summary (Last 24 hours) at 12/15/2020 1007 Last data filed at 12/14/2020 2300 Gross per 24 hour  Intake 480 ml  Output 1725 ml  Net -1245 ml    Last 3 Weights 12/15/2020 12/14/2020 12/14/2020  Weight (lbs) 112 lb 10.5 oz 113 lb 1.5 oz 130 lb 4.7 oz  Weight (kg) 51.1 kg 51.3 kg 59.1 kg  Some encounter information is confidential and restricted. Go to Review Flowsheets activity to see all data.      Telemetry    NSR with frequent ectopy, earlier documented MAT - Personally Reviewed  ECG    No new tracings - Personally Reviewed  Physical Exam   GEN: Elderly female, NAD  Neck:  Unable to assess JVD - pt unable to follow instruction Cardiac: RRR with extrasystole, 2/6 systolic murmur. No, rubs, or gallops.  Respiratory: Reduced breath sounds bilaterally, very poor inspiratory effort during lung exam GI: Soft, nontender, non-distended  MS: moderate bilateral LEE; No deformity. Neuro:  At times confused Psych: Normal affect   Labs    High Sensitivity Troponin:  No results for input(s): TROPONINIHS in the last 720 hours.    Chemistry Recent Labs  Lab 12/10/20 1958 12/12/20 0703 12/13/20 0549 12/14/20 0449 12/15/20 0532  NA 138   < > 138 137 136  K 4.5   < > 3.8 4.0 4.7  CL 105   < > 103 102 96*  CO2 26   < > 31 31 30   GLUCOSE 119*   < > 85 97 97  BUN 23   < > 25* 19 17  CREATININE 0.96   < > 0.90 0.78 0.86  CALCIUM 8.5*   < > 8.3* 8.4* 9.1  PROT 6.4*  --  5.7*  --   --   ALBUMIN 3.7  --  3.3*  --   --   AST 19  --  14*  --   --   ALT 15  --  10  --   --   ALKPHOS 60  --  47  --   --   BILITOT 0.7  --  0.8  --   --   GFRNONAA 59*   < > >60 >60 >60  ANIONGAP 7   < > 4* 4* 10   < > = values in this interval not displayed.      Hematology Recent Labs  Lab 12/13/20 0549 12/14/20 0449 12/15/20 0532  WBC 3.4* 3.8* 6.6  RBC 2.57* 2.56* 3.09*  HGB 9.5* 9.6* 11.6*  HCT 29.1* 28.8* 34.5*  MCV 113.2* 112.5* 111.7*  MCH 37.0* 37.5* 37.5*  MCHC 32.6 33.3 33.6  RDW 14.1 13.8 13.8  PLT 197 184 217     BNP Recent Labs  Lab 12/10/20 1958  BNP 1,480.5*      DDimer No results for input(s): DDIMER in the last 168 hours.   Radiology    DG Chest 1 View  Result Date: 12/13/2020 CLINICAL DATA:  Status post right thoracentesis. EXAM: CHEST  1 VIEW COMPARISON:  December 12, 2020. FINDINGS: Stable cardiomegaly. No pneumothorax is noted. No significant pleural effusion is seen currently. The visualized skeletal structures are unremarkable. IMPRESSION: No pneumothorax is noted. No significant pleural effusion is seen currently. Aortic Atherosclerosis  (ICD10-I70.0). Electronically Signed   By: Lupita Raider M.D.   On: 12/13/2020 12:01   US THORACENTESIS ASP PLEURAL SPACE W/IMG GUIDE  Result Date: 12/13/2020 INDICATION: Patient with a history of heart failure presents today with right pleural effusion. Interventional radiology asked to perform a diagnostic and therapeutic thoracentesis. EXAM: ULTRASOUND GUIDED THORACENTESIS MEDICATIONS: % lidocaine 10 mL COMPLICATIONS: None immediate. PROCEDURE: An ultrasound guided thoracentesis was thoroughly discussed with the patient and questions answered. The benefits, risks, alternatives and complications were also discussed. The patient understands and wishes to proceed with the procedure. Written consent was obtained. Ultrasound was performed to localize and mark an adequate pocket of fluid in the right chest. The area was then prepped and draped in the normal sterile fashion. 1% Lidocaine was used for local anesthesia. Under ultrasound guidance a 6 Fr Safe-T-Centesis catheter was introduced. Thoracentesis was performed. The catheter was removed and a dressing applied. FINDINGS: A total of approximately 800 mL of clear yellow fluid was removed. Samples were sent to the laboratory as requested by the clinical team. IMPRESSION: Successful ultrasound guided right thoracentesis yielding 800 mL of pleural fluid. Read by: Alwyn Ren, NP Electronically Signed   By: Simonne Come M.D.   On: 12/13/2020 12:18    Cardiac Studies   Echo 12/11/20  1. Left ventricular ejection fraction, by estimation, is 60 to 65%. The  left ventricle has normal function. The left ventricle has no regional  wall motion abnormalities. There is mmoderate to severe left ventricular  hypertrophy with near cavity  obliteration in systole. Left ventricular diastolic parameters are  consistent with Grade III diastolic dysfunction (restrictive).   2. Right ventricular systolic function is normal. The right ventricular  size is normal.  There is severely elevated pulmonary artery systolic  pressure. The estimated right ventricular systolic pressure is 61.9 mmHg.   3. Left atrial size was mild to moderately dilated.   4. The mitral valve is normal in structure. Mild to moderate mitral valve  regurgitation.   5. Tricuspid valve regurgitation is moderate.   6. The inferior vena cava is dilated in size with >50% respiratory  variability, suggesting right atrial pressure of 8 mmHg.   Patient Profile     85 y.o. female with history of chronic diastolic heart failure, bipolar disorder requiring regular ECT treatments, hypertension, hyperlipidemia, GERD, chronic low back pain, early dementia, and who is seen today for diastolic heart failure.  Assessment & Plan    Acute on Chronic diastolic heart failure Pulmonary Hypertension --Presented with abdominal pain and diuresis held given n.p.o. status in the setting of partial small bowel obstruction.  Echo 9/17 EF 60 to 65% and details as above. S/p thoracentesis with 800cc of fluid.  Improving with current dose diuresis. Cr stable.  Net -1.2 L yesterday. Wt stable from yesterday. Continue Lasix 40 mg daily and losartan. Increased to metoprolol 100 mg twice daily as below No plan for cath this admission given age and dementia.  MAT  -- NSR with frequent ectopy on telemetry.  Increased to Lopressor 100 mg twice daily, given room in HR/BP.  Consider consolidation to Toprol before discharge.  Maintain electrolytes at goal.   HTN -- BP elevated this AM.  Increased to Lopressor 100 mg twice daily as above.  Consider consolidation to Toprol before discharge.  Continue Losartan.  HLD --Continue statin.  Anemia, likely of chronic disease --Continue to monitor with daily CBC. Consider as contributing to her SOB.  For questions or updates, please contact [facility] Please consult [email or web address] for contact info under        Signed, Lennon Alstrom, PA-C  12/15/2020, 10:07 AM

## 2020-12-15 NOTE — Progress Notes (Signed)
[facility] Progress Note  Patient: Jenna Dudley    EUM:[id number]  DOA: 12/10/2020      Brief hospital course:  Jenna Dudley is a 85 y.o. female with past medical history of chronic diastolic heart failure, bipolar disorder, hypertension, dementia, chronic low back pain presented to the hospital with abnormal CT scan concerning for partial small bowel obstruction.   She did have shortness of breath on exertion and orthopnea. In the ED, vitals were stable.  Hemoglobin was 9.5.   BNP elevated at 1480.  EKG showed normal sinus rhythm.  CT scan of the abdomen and pelvis showed dilated loops of bowel in the left upper quadrant with transition point.  Patient also had moderate right pleural effusion new from 11/24/2020.  Patient was given Lasix and patient was admitted hospital for further evaluation and treatment.   Assessment and Plan:   Partial small bowel obstruction, resolved CT scan with transition point.  There was no indication for surgical intervention.  Continue laxatives.  Constipation predominant.  Tolerating oral diet but has poor oral intake.  Low-grade fever.  Had received COVID-vaccine yesterday.  We will continue to monitor  Acute urine retention, Present on admission.  We will do a voiding trial today.  Acute on chronic diastolic CHF right-sided pleural effusion CT scan showed right-sided pleural effusion on initial presentation..    Continue metoprolol losartan daily weights, intake and output charting.  2D echocardiogram shows LV ejection fraction of 65%.  Status post ultrasound-guided thoracentesis on 06/12/2020 with 800 mL of clear fluid.  Patient has been started on oral Lasix.  Chronic Anemia Hemoglobin of 11.6.    Essential hypertension Continue metoprolol and losartan.  Blood pressure is stable.     Chronic low back pain Continue analgesics.     Depression, bipolar disorder Continue venlafaxine, Zyprexa and Remeron     Dementia  Continue delirium  precautions   Vitamin D deficiency, Continue vitamin D 50k units p.o. weekly.  Follow with PCP to repeat vitamin D level after 3 months.  Debility, deconditioning, chronic back pain.   Physical therapy has recommended skilled nursing facility placement but at this time patient's husband wishes her to come home  Disposition PT recommends skilled nursing facility but patient's husband wishes her to go home.  DVT Prophylaxis: Subcutaneous Lovenox   CODE STATUS: Full code  Family Communication:  Spoke with the patient's husband at bedside and updated him about the clinical condition of the patient.  Disposition:  Skilled nursing facility as per PT evaluation but likely home with home health  Subjective:  Today, patient was seen and examined at bedside.  Denies shortness of breath, chest pain.  Nursing staff reported that she has not eaten much.  Had low-grade fever.  Patient's husband at bedside.  Physical Exam: General:  Average built, not in obvious distress, thinly built HENT:   No scleral pallor or icterus noted. Oral mucosa is mildly dry. Chest:   Diminished breath sounds bilaterally. No crackles or wheezes.  CVS: S1 &S2 heard, murmur noted.  Regular rate and rhythm. Abdomen: Soft, nontender, nondistended.  Bowel sounds are heard.   Extremities: No cyanosis, clubbing or edema.  Peripheral pulses are palpable. Psych: Alert, awake and communicative. CNS:  No cranial nerve deficits.  Power equal in all extremities.   Skin: Warm and dry.  No rashes noted.   Vitals:   12/15/20 0409 12/15/20 0904 12/15/20 1247 12/15/20 1258  BP:  (!) 147/65 135/64   Pulse:  89  92   Resp:  17 (!) 22   Temp:  99.2 F (37.3 C) 99.7 F (37.6 C) 99.6 F (37.6 C)  TempSrc:   Oral Axillary  SpO2:  94% 97%   Weight: 51.1 kg     Height:        Intake/Output Summary (Last 24 hours) at 12/15/2020 1402 Last data filed at 12/15/2020 1013 Gross per 24 hour  Intake 240 ml  Output 725 ml  Net -485 ml     Filed Weights   12/14/20 0400 12/14/20 0926 12/15/20 0409  Weight: 59.1 kg 51.3 kg 51.1 kg    Data Reviewed:  CBC: Recent Labs  Lab 12/10/20 1958 12/12/20 0703 12/13/20 0549 12/14/20 0449 12/15/20 0532  WBC 4.5 3.5* 3.4* 3.8* 6.6  HGB 9.5* 9.3* 9.5* 9.6* 11.6*  HCT 28.5* 27.2* 29.1* 28.8* 34.5*  MCV 116.8* 113.8* 113.2* 112.5* 111.7*  PLT 217 193 197 184 217    Basic Metabolic Panel: Recent Labs  Lab 12/10/20 1958 12/12/20 0703 12/13/20 0549 12/14/20 0449 12/15/20 0532  NA 138 138 138 137 136  K 4.5 3.6 3.8 4.0 4.7  CL 105 100 103 102 96*  CO2 26 29 31 31 30   GLUCOSE 119* 83 85 97 97  BUN 23 24* 25* 19 17  CREATININE 0.96 0.94 0.90 0.78 0.86  CALCIUM 8.5* 8.5* 8.3* 8.4* 9.1  MG  --  1.8 1.8 1.9  --   PHOS  --  4.6 4.2 4.1  --      Studies: No results found.  Scheduled Meds:  Chlorhexidine Gluconate Cloth  6 each Topical Daily   enoxaparin (LOVENOX) injection  40 mg Subcutaneous Q24H   furosemide  40 mg Oral Daily   influenza vaccine adjuvanted  0.5 mL Intramuscular Tomorrow-1000   losartan  25 mg Oral Daily   metoprolol tartrate  100 mg Oral BID   mineral oil  1 enema Rectal Once   mirtazapine  45 mg Oral QHS   OLANZapine  20 mg Oral QHS   pantoprazole  40 mg Oral Daily   polyethylene glycol  17 g Oral Daily   simvastatin  40 mg Oral QHS   venlafaxine XR  150 mg Oral Q breakfast   vitamin B-12  500 mcg Oral Daily   Vitamin D (Ergocalciferol)  50,000 Units Oral Q7 days   Continuous Infusions: PRN Meds: acetaminophen **OR** acetaminophen, acetaminophen-codeine, ondansetron **OR** ondansetron (ZOFRAN) IV  , MD [facility] 12/15/2020 2:02 PM

## 2020-12-15 NOTE — Plan of Care (Signed)

## 2020-12-15 NOTE — Consult Note (Signed)
   Heart Failure Nurse Navigator Note  HFpEF 65%.  Grade 3 diastolic dysfunction.  Severely elevated pulmonary artery systolic pressures.  Mild to moderate mitral regurgitation.  Moderate tricuspid regurgitation.  She presented to the emergency room due to abdominal pain and abnormal CT of the abdomen.  She also noted shortness of breath with exertion and shortness of breath with lying flat at night.  BNP was noted to be 1480.  Comorbidities:  Chronic anemia Essential hypertension Depression Dementia Bipolar  Labs:  Sodium 136, potassium 4.7, chloride 96, CO2 30, BUN 17, creatinine 0.86 Intake 580 mL Output 1725 mL Weight 51.1 kg down from 51.3 of yesterday.  Initial meeting with patient and her husband who was at the bedside.  Spoke directly with husband as he is her caregiver.  Discussed her heart failure and the type that she has.  Husband voices understanding.  Also discussed taking care of her at home.  This includes daily weights, low-sodium restriction and fluid restriction.  Husband states that he ALREADY reads labels and tries to get the lowest sodium content that he can.  He is the one is in charge of the meals.  Also discussed to follow-up in the outpatient heart failure clinic with [facility].  He had no further question.  He was given the living with heart failure teaching booklet along with the heart failure packet.  Tresa Endo RN [facility]

## 2020-12-16 ENCOUNTER — Encounter: Payer: Self-pay | Admitting: Internal Medicine

## 2020-12-16 DIAGNOSIS — I10 Essential (primary) hypertension: Secondary | ICD-10-CM | POA: Diagnosis not present

## 2020-12-16 DIAGNOSIS — F0390 Unspecified dementia without behavioral disturbance: Secondary | ICD-10-CM | POA: Diagnosis not present

## 2020-12-16 DIAGNOSIS — I5033 Acute on chronic diastolic (congestive) heart failure: Secondary | ICD-10-CM | POA: Diagnosis not present

## 2020-12-16 DIAGNOSIS — M545 Low back pain, unspecified: Secondary | ICD-10-CM | POA: Diagnosis not present

## 2020-12-16 LAB — BASIC METABOLIC PANEL
Anion gap: 9 (ref 5–15)
BUN: 26 mg/dL — ABNORMAL HIGH (ref 8–23)
CO2: 30 mmol/L (ref 22–32)
Calcium: 8.5 mg/dL — ABNORMAL LOW (ref 8.9–10.3)
Chloride: 95 mmol/L — ABNORMAL LOW (ref 98–111)
Creatinine, Ser: 1.15 mg/dL — ABNORMAL HIGH (ref 0.44–1.00)
GFR, Estimated: 47 mL/min — ABNORMAL LOW (ref >60)
Glucose, Bld: 98 mg/dL (ref 70–99)
Potassium: 4 mmol/L (ref 3.5–5.1)
Sodium: 134 mmol/L — ABNORMAL LOW (ref 135–145)

## 2020-12-16 LAB — CBC
HCT: 30.2 % — ABNORMAL LOW (ref 36.0–46.0)
Hemoglobin: 10.4 g/dL — ABNORMAL LOW (ref 12.0–15.0)
MCH: 39 pg — ABNORMAL HIGH (ref 26.0–34.0)
MCHC: 34.4 g/dL (ref 30.0–36.0)
MCV: 113.1 fL — ABNORMAL HIGH (ref 80.0–100.0)
Platelets: 134 10*3/uL — ABNORMAL LOW (ref 150–400)
RBC: 2.67 MIL/uL — ABNORMAL LOW (ref 3.87–5.11)
RDW: 14.2 % (ref 11.5–15.5)
WBC: 3.1 10*3/uL — ABNORMAL LOW (ref 4.0–10.5)
nRBC: 0 % (ref 0.0–0.2)

## 2020-12-16 LAB — URINALYSIS, ROUTINE W REFLEX MICROSCOPIC
Bacteria, UA: NONE SEEN
Bilirubin Urine: NEGATIVE
Glucose, UA: NEGATIVE mg/dL
Hgb urine dipstick: NEGATIVE
Ketones, ur: NEGATIVE mg/dL
Nitrite: NEGATIVE
Protein, ur: NEGATIVE mg/dL
Specific Gravity, Urine: 1.015 (ref 1.005–1.030)
WBC, UA: >50 WBC/hpf — ABNORMAL HIGH (ref 0–5)
pH: 5 (ref 5.0–8.0)

## 2020-12-16 LAB — BODY FLUID CULTURE W GRAM STAIN: Culture: NO GROWTH

## 2020-12-16 LAB — MAGNESIUM: Magnesium: 2 mg/dL (ref 1.7–2.4)

## 2020-12-16 LAB — PHOSPHORUS: Phosphorus: 5.3 mg/dL — ABNORMAL HIGH (ref 2.5–4.6)

## 2020-12-16 MED ORDER — ENOXAPARIN SODIUM 30 MG/0.3ML IJ SOSY
30.0000 mg | PREFILLED_SYRINGE | INTRAMUSCULAR | Status: DC
  Administered 2020-12-17: 30 mg via SUBCUTANEOUS
  Filled 2020-12-16: qty 0.3

## 2020-12-16 MED ORDER — SODIUM CHLORIDE 0.9 % IV SOLN
1.0000 g | INTRAVENOUS | Status: DC
  Administered 2020-12-16: 1 g via INTRAVENOUS
  Filled 2020-12-16 (×2): qty 10

## 2020-12-16 NOTE — Care Management Important Message (Signed)
Important Message  Patient Details  Name: Jenna Dudley MRN: [id number] Date of Birth: [birth year]   Medicare Important Message Given:  Yes     [name] 12/16/2020, 2:16 PM

## 2020-12-16 NOTE — Progress Notes (Addendum)
Physical Therapy Treatment Patient Details Name: Jenna Dudley MRN: [id number] DOB: Mar 27, 1936 Today's Date: 12/16/2020   History of Present Illness 85 y.o. female with past medical history of chronic diastolic heart failure, bipolar disorder, hypertension, dementia, chronic low back pain presented to the hospital with abnormal CT scan concerning for partial small bowel obstruction (now resolved, managed by laxatives).  Patient also found to have moderate right pleural effusion (with scheduled theoracentesis 9/19)    PT Comments    Pt alert, cooperative throughout treatment session, oriented to name only. Improvement in ability to follow commands and willingness to participate in therapy. Pt continues to state "I'm scared" when asked to stand or walk limiting treatment session despite encouragement. Pt progressed to standing w/ + 1 HHA, sit <> stand w/ MIN A for momentum, stand pivot bed > recliner + 1 HHA, MIN-G for safety. Lap belt chair alarm in place, PT provided hand over hand instruction for removal. Primary limitations remain decreased functional mobility, strength, and endurance. SNF remains primary discharge recommendations. PT will continue to assess during future treatment sessions.    Recommendations for follow up therapy are one component of a multi-disciplinary discharge planning process, led by the attending physician.  Recommendations may be updated based on patient status, additional functional criteria and insurance authorization.  Follow Up Recommendations  SNF     Equipment Recommendations  Rolling walker with 5" wheels    Recommendations for Other Services       Precautions / Restrictions Precautions Precautions: Fall Restrictions Weight Bearing Restrictions: No     Mobility  Bed Mobility Overal bed mobility: Needs Assistance Bed Mobility: Supine to Sit     Supine to sit: Min assist     General bed mobility comments: Pt requries increased time to mobilize  LE w/ cues and able to bring trunk upright w/ MIN A    Transfers Overall transfer level: Needs assistance Equipment used: 1 person hand held assist Transfers: Stand Pivot Transfers;Sit to/from Stand Sit to Stand: Min assist Stand pivot transfers: Min guard       General transfer comment: MIN A for momentum, pt able to reach towards recliner and transfer to chair without LOB w/ MIN G  Ambulation/Gait             General Gait Details: Pt denied further mobility   Stairs             Wheelchair Mobility    Modified Rankin (Stroke Patients Only)       Balance Overall balance assessment: Needs assistance Sitting-balance support: Feet supported;Bilateral upper extremity supported Sitting balance-Leahy Scale: Good Sitting balance - Comments: Pt able to raise arms with feet supported w/o LOB     Standing balance-Leahy Scale: Fair Standing balance comment: + 1 hand held assist for dyanmic & static stability                            Cognition Arousal/Alertness: Awake/alert Behavior During Therapy: WFL for tasks assessed/performed Overall Cognitive Status: No family/caregiver present to determine baseline cognitive functioning                                 General Comments: Oriened to self, disorietned to time, place, situation. Pt consistently states "I'm scared. I want to go home." Improvement in ability to follow one-step commands      Exercises  General Comments        Pertinent Vitals/Pain Pain Assessment: No/denies pain    Home Living                      Prior Function            PT Goals (current goals can now be found in the care plan section) Progress towards PT goals: Progressing toward goals    Frequency    Min 2X/week      PT Plan Current plan remains appropriate    Co-evaluation              AM-PAC PT "6 Clicks" Mobility   Outcome Measure  Help needed turning from your back  to your side while in a flat bed without using bedrails?: A Little Help needed moving from lying on your back to sitting on the side of a flat bed without using bedrails?: A Little Help needed moving to and from a bed to a chair (including a wheelchair)?: A Little Help needed standing up from a chair using your arms (e.g., wheelchair or bedside chair)?: A Little Help needed to walk in hospital room?: A Lot Help needed climbing 3-5 steps with a railing? : A Lot 6 Click Score: 16    End of Session Equipment Utilized During Treatment: Gait belt Activity Tolerance: Patient tolerated treatment well Patient left: in chair;with call bell/phone within reach;with chair alarm set Nurse Communication: Mobility status PT Visit Diagnosis: Muscle weakness (generalized) (M62.81);Unsteadiness on feet (R26.81)     Time: 7793-9030 PT Time Calculation (min) (ACUTE ONLY): 27 min  Charges:                       [employer], SPT

## 2020-12-16 NOTE — TOC Initial Note (Signed)
Transition of Care [facility]) - Initial/Assessment Note    Patient Details  Name: Jenna Dudley MRN: [id number] Date of Birth: 1935-07-02  Transition of [facility]) CM/SW Contact:    Gildardo Griffes, LCSW Phone Number: 12/16/2020, 1:27 PM  Clinical Narrative:                  Patient requested CSW speak to her husband regarding dc planning.   CSW spoke with patient's husband [name] who reports him and patient are in agreement with patient going home with home health services. Reports preference for [facility] who they have had in the past for PT and RN.   Reports patient already has a walker and cane at home, however does need a 3in1. CSW has reached out to [place] with [facility] to deliver 3in1 to patient's room prior to discharge.   CSW notes patient's husband to pick her up at time of discharge.   No other discharge needs identified at this time.    Expected Discharge Plan: Home w Home Health Services Barriers to Discharge: Continued Medical Work up   Patient Goals and CMS Choice Patient states their goals for this hospitalization and ongoing recovery are:: to go home CMS Medicare.gov Compare Post Acute Care list provided to:: Patient Represenative (must comment) (spoue) Choice offered to / list presented to : Spouse  Expected Discharge Plan and Services Expected Discharge Plan: Home w Home Health Services       Living arrangements for the past 2 months: Single Family Home                 DME Arranged: 3-N-1 DME Agency: [facility] Date DME Agency Contacted: 12/16/20 Time DME Agency Contacted: 75 Representative spoke with at DME Agency: Bjorn Loser HH Arranged: PT, RN HH Agency: [facility] Date HH Agency Contacted: 12/16/20 Time HH Agency Contacted: [phone] Representative spoke with at [facility] Agency: Barbara Cower  Prior Living Arrangements/Services Living arrangements for the past 2 months: Single Family Home Lives with:: Spouse Patient language and need for  interpreter reviewed:: Yes Do you feel safe going back to the place where you live?: Yes      Need for Family Participation in Patient Care: Yes (Comment) Care giver support system in place?: Yes (comment)   Criminal Activity/Legal Involvement Pertinent to Current Situation/Hospitalization: No - Comment as needed  Activities of Daily Living Home Assistive Devices/Equipment: None, Bedside commode/3-in-1 ADL Screening (condition at time of admission) Patient's cognitive ability adequate to safely complete daily activities?: No Is the patient deaf or have difficulty hearing?: No Does the patient have difficulty seeing, even when wearing glasses/contacts?: No Does the patient have difficulty concentrating, remembering, or making decisions?: Yes Patient able to express need for assistance with ADLs?: No Does the patient have difficulty dressing or bathing?: Yes Independently performs ADLs?: Yes (appropriate for developmental age) Does the patient have difficulty walking or climbing stairs?: Yes Weakness of Legs: Right Weakness of Arms/Hands: None  Permission Sought/Granted Permission sought to share information with : Case Manager, Magazine features editor, Family Supports Permission granted to share information with : Yes, Verbal Permission Granted  Share Information with NAME: [name]  Permission granted to share info w AGENCY: HH  Permission granted to share info w Relationship: spoue  Permission granted to share info w Contact Information: [phone]  Emotional Assessment Appearance:: Appears stated age     Orientation: : Oriented to Self, Oriented to Place, Oriented to  Time, Oriented to Situation Alcohol / Substance Use:  Not Applicable Psych Involvement: No (comment)  Admission diagnosis:  Urine retention [R33.9] SBO (small bowel obstruction) (HCC) [K56.609] SOB (shortness of breath) [R06.02] CHF exacerbation (HCC) [I50.9] Pleural effusion on right [J90] Acute on  chronic diastolic congestive heart failure (HCC) [I50.33] Patient Active Problem List   Diagnosis Date Noted   CHF exacerbation (HCC) 12/10/2020   Dementia (HCC)    HCAP (healthcare-associated pneumonia) 12/18/2017   Bipolar 1 disorder (HCC) 05/29/2017   CHF (congestive heart failure) (HCC) 04/23/2017   Accelerated hypertension 04/21/2017   Severe sepsis (HCC) 10/03/2015   Hypernatremia 10/03/2015   Lactic acidosis 10/03/2015   Leukocytosis 10/03/2015   Agitation 10/03/2015   Severe recurrent major depression without psychotic features (HCC)    Major depressive disorder, recurrent episode, severe, with psychotic behavior (HCC) 10/13/2014   Severe episode of recurrent major depressive disorder, with psychotic features (HCC) 10/13/2014   Major depressive disorder, 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 features (HCC) 10/06/2014   Essential hypertension 09/17/2014   Chronic back pain 09/17/2014   Gastric reflux 09/17/2014   History of stroke 09/17/2014   Severe recurrent major depression with psychotic features (HCC)    Acute on chronic diastolic CHF (congestive heart failure) (HCC) 07/29/2014   Partial small bowel obstruction (HCC) 07/29/2014   Hallux abductovalgus with bunions 09/12/2013   Hammer toe 09/12/2013   Foot pain 09/12/2013   Fungal infection of nail 09/12/2013   Carpal tunnel syndrome 12/21/2010   Chronic low back pain 12/21/2010   Degenerative arthritis of lumbar spine 12/21/2010   Depression 12/21/2010   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 [place] [address] Phone: [phone] Fax: [phone]     Social Determinants of Health (SDOH) Interventions    Readmission Risk Interventions No flowsheet data found.

## 2020-12-16 NOTE — Progress Notes (Addendum)
[facility] Progress Note  Patient: Jenna Dudley    VQQ:[id number]  DOA: 12/10/2020      Brief hospital course:  Jenna Dudley is a 85 y.o. female with past medical history of chronic diastolic heart failure, bipolar disorder, hypertension, dementia, chronic low back pain presented to the hospital with abnormal CT scan concerning for partial small bowel obstruction.   She did have shortness of breath on exertion and orthopnea. In the ED, vitals were stable.  Hemoglobin was 9.5.   BNP elevated at 1480.  EKG showed normal sinus rhythm.  CT scan of the abdomen and pelvis showed dilated loops of bowel in the left upper quadrant with transition point.  Patient also had moderate right pleural effusion new from 11/24/2020.  Patient was given Lasix and patient was admitted hospital for further evaluation and treatment.   Assessment and Plan:   Partial small bowel obstruction, resolved CT scan was with transition point.  There was no indication for surgical intervention.  Continue laxatives.  Constipation predominant.  Tolerating oral diet but has poor oral intake.  We will continue to monitor for bowel movements  Fever.  Temperature max of 102.8 F yesterday evening at 7 PM.  Urine analysis abnormal.  Urine culture has been sent.  We will start the patient on Rocephin IV.  Had received COVID-vaccine recently.  We will continue to monitor.  Will initiate the patient on Rocephin IV.  We will hold tomorrow's dose of Lasix.  Acute urine retention Resolved.  Off Foley catheter  Acute on chronic diastolic CHF right-sided pleural effusion CT scan showed right-sided pleural effusion on initial presentation..    Continue metoprolol, losartan daily weights, intake and output charting.  2D echocardiogram shows LV ejection fraction of 65%.  Status post ultrasound-guided thoracentesis on 06/12/2020 with 800 mL of clear fluid.  Patient has been started on oral Lasix.  Seen by cardiology and patient is compensated  at this time .  We will hold tomorrow dose of Lasix.  Chronic Anemia Hemoglobin of 11.6.  Continue to monitor.    Essential hypertension Continue metoprolol and losartan.  Blood pressure is stable.     Chronic low back pain Continue analgesics.     Depression, bipolar disorder Continue venlafaxine, Zyprexa and Remeron     Dementia  Continue delirium precautions   Vitamin D deficiency, Continue vitamin D 50k units p.o. weekly.  Follow with PCP to repeat vitamin D level after 3 months.  Debility, deconditioning, chronic back pain.   Physical therapy has recommended skilled nursing facility placement but  husband wishes her to come home  Disposition PT recommends skilled nursing facility but patient's husband wishes her to go home.  DVT Prophylaxis: Subcutaneous Lovenox   CODE STATUS: Full code  Family Communication:  Spoke with the patient's husband at bedside on 12/15/2020.  Unable to reach the patient's husband today.  Disposition:  Skilled nursing facility as per PT evaluation but likely home with home health as per the patient's spouse   Subjective:  Patient was seen and examined at bedside.  Denies interval complaints.  Had fever yesterday of 102.  Physical Exam: General: Thinly built, not in obvious distress, appears sluggish to respond. HENT:   No scleral pallor or icterus noted. Oral mucosa is moist.  Chest:  Diminished breath sounds bilaterally. No crackles or wheezes.  CVS: S1 &S2 heard. No murmur.  Regular rate and rhythm. Abdomen: Soft, nontender, nondistended.  Bowel sounds are heard.   Extremities: No cyanosis,  clubbing or edema.  Peripheral pulses are palpable. Psych: Alert, awake and communicative. CNS:  No cranial nerve deficits.  Power equal in all extremities.   Skin: Warm and dry.  No rashes noted.   Vitals:   12/16/20 0014 12/16/20 0402 12/16/20 0500 12/16/20 0940  BP: 112/60 108/64  119/63  Pulse: 78 67  82  Resp: 18 18    Temp: 99.8 F (37.7  C) 98.2 F (36.8 C)  97.8 F (36.6 C)  TempSrc: Oral   Oral  SpO2: 96% 93%  100%  Weight:   50.6 kg   Height:        Intake/Output Summary (Last 24 hours) at 12/16/2020 1320 Last data filed at 12/16/2020 0959 Gross per 24 hour  Intake 690 ml  Output 400 ml  Net 290 ml    Filed Weights   12/14/20 0926 12/15/20 0409 12/16/20 0500  Weight: 51.3 kg 51.1 kg 50.6 kg    Data Reviewed:  CBC: Recent Labs  Lab 12/12/20 0703 12/13/20 0549 12/14/20 0449 12/15/20 0532 12/16/20 0752  WBC 3.5* 3.4* 3.8* 6.6 3.1*  HGB 9.3* 9.5* 9.6* 11.6* 10.4*  HCT 27.2* 29.1* 28.8* 34.5* 30.2*  MCV 113.8* 113.2* 112.5* 111.7* 113.1*  PLT 193 197 184 217 134*    Basic Metabolic Panel: Recent Labs  Lab 12/12/20 0703 12/13/20 0549 12/14/20 0449 12/15/20 0532 12/16/20 0539  NA 138 138 137 136 134*  K 3.6 3.8 4.0 4.7 4.0  CL 100 103 102 96* 95*  CO2 29 31 31 30 30   GLUCOSE 83 85 97 97 98  BUN 24* 25* 19 17 26*  CREATININE 0.94 0.90 0.78 0.86 1.15*  CALCIUM 8.5* 8.3* 8.4* 9.1 8.5*  MG 1.8 1.8 1.9  --  2.0  PHOS 4.6 4.2 4.1  --  5.3*     Studies: No results found.  Scheduled Meds:  Chlorhexidine Gluconate Cloth  6 each Topical Daily   enoxaparin (LOVENOX) injection  30 mg Subcutaneous Q24H   furosemide  40 mg Oral Daily   influenza vaccine adjuvanted  0.5 mL Intramuscular Tomorrow-1000   losartan  25 mg Oral Daily   metoprolol tartrate  100 mg Oral BID   mirtazapine  45 mg Oral QHS   OLANZapine  20 mg Oral QHS   pantoprazole  40 mg Oral Daily   polyethylene glycol  17 g Oral BID   senna-docusate  1 tablet Oral BID   simvastatin  40 mg Oral QHS   venlafaxine XR  150 mg Oral Q breakfast   vitamin B-12  500 mcg Oral Daily   Vitamin D (Ergocalciferol)  50,000 Units Oral Q7 days   Continuous Infusions: PRN Meds: acetaminophen **OR** acetaminophen, acetaminophen-codeine, ondansetron **OR** ondansetron (ZOFRAN) IV  , MD [facility] 12/16/2020 1:20 PM

## 2020-12-17 DIAGNOSIS — M545 Low back pain, unspecified: Secondary | ICD-10-CM | POA: Diagnosis not present

## 2020-12-17 DIAGNOSIS — I10 Essential (primary) hypertension: Secondary | ICD-10-CM | POA: Diagnosis not present

## 2020-12-17 DIAGNOSIS — I5033 Acute on chronic diastolic (congestive) heart failure: Secondary | ICD-10-CM | POA: Diagnosis not present

## 2020-12-17 DIAGNOSIS — F0390 Unspecified dementia without behavioral disturbance: Secondary | ICD-10-CM | POA: Diagnosis not present

## 2020-12-17 LAB — CBC
HCT: 30.2 % — ABNORMAL LOW (ref 36.0–46.0)
Hemoglobin: 10.2 g/dL — ABNORMAL LOW (ref 12.0–15.0)
MCH: 38.6 pg — ABNORMAL HIGH (ref 26.0–34.0)
MCHC: 33.8 g/dL (ref 30.0–36.0)
MCV: 114.4 fL — ABNORMAL HIGH (ref 80.0–100.0)
Platelets: 123 10*3/uL — ABNORMAL LOW (ref 150–400)
RBC: 2.64 MIL/uL — ABNORMAL LOW (ref 3.87–5.11)
RDW: 14 % (ref 11.5–15.5)
WBC: 2.9 10*3/uL — ABNORMAL LOW (ref 4.0–10.5)
nRBC: 0 % (ref 0.0–0.2)

## 2020-12-17 LAB — MAGNESIUM: Magnesium: 2.3 mg/dL (ref 1.7–2.4)

## 2020-12-17 LAB — BASIC METABOLIC PANEL
Anion gap: 11 (ref 5–15)
BUN: 38 mg/dL — ABNORMAL HIGH (ref 8–23)
CO2: 29 mmol/L (ref 22–32)
Calcium: 8.6 mg/dL — ABNORMAL LOW (ref 8.9–10.3)
Chloride: 96 mmol/L — ABNORMAL LOW (ref 98–111)
Creatinine, Ser: 1.37 mg/dL — ABNORMAL HIGH (ref 0.44–1.00)
GFR, Estimated: 38 mL/min — ABNORMAL LOW (ref >60)
Glucose, Bld: 78 mg/dL (ref 70–99)
Potassium: 3.9 mmol/L (ref 3.5–5.1)
Sodium: 136 mmol/L (ref 135–145)

## 2020-12-17 MED ORDER — POLYETHYLENE GLYCOL 3350 17 G PO PACK: 17.0000 g | PACK | Freq: Every day | ORAL | 0 refills | Status: DC | PRN

## 2020-12-17 MED ORDER — VITAMIN D (ERGOCALCIFEROL) 1.25 MG (50000 UNIT) PO CAPS: 50000.0000 [IU] | ORAL_CAPSULE | ORAL | 0 refills | Status: AC

## 2020-12-17 MED ORDER — FUROSEMIDE 20 MG PO TABS: 40.0000 mg | ORAL_TABLET | Freq: Every day | ORAL | 5 refills | Status: DC

## 2020-12-17 MED ORDER — METOPROLOL TARTRATE 100 MG PO TABS: 100.0000 mg | ORAL_TABLET | Freq: Two times a day (BID) | ORAL | 2 refills | Status: DC

## 2020-12-17 MED ORDER — CEFDINIR 300 MG PO CAPS: 300.0000 mg | ORAL_CAPSULE | Freq: Two times a day (BID) | ORAL | 0 refills | Status: AC

## 2020-12-17 MED ORDER — CYANOCOBALAMIN 500 MCG PO TABS: 500.0000 ug | ORAL_TABLET | Freq: Every day | ORAL | 2 refills | Status: AC

## 2020-12-17 MED ORDER — SENNOSIDES-DOCUSATE SODIUM 8.6-50 MG PO TABS: 1.0000 | ORAL_TABLET | Freq: Two times a day (BID) | ORAL | 1 refills | Status: AC

## 2020-12-17 NOTE — TOC Transition Note (Signed)
Transition of Care [facility]) - CM/SW Discharge Note   Patient Details  Name: Jenna Dudley MRN: [id number] Date of Birth: 01/25/1936  Transition of Care [facility]) CM/SW Contact:  Gildardo Griffes, LCSW Phone Number: 12/17/2020, 9:08 AM   Clinical Narrative:     Patient to discharge home with home health services of PT and RN through [facility]. Barbara Cower with [facility] notified of dc today and HH orders are in. 3in1 was delivered to patient's room via Bjorn Loser with [facility] yesterday 9/22, no other dc needs identified at this time.   Patient's husband to transport home.    Final next level of care: Home w Home Health Services Barriers to Discharge: No Barriers Identified   Patient Goals and CMS Choice Patient states their goals for this hospitalization and ongoing recovery are:: to go home CMS Medicare.gov Compare Post Acute Care list provided to:: Patient Represenative (must comment) (husband) Choice offered to / list presented to : Spouse  Discharge Placement                Patient to be transferred to facility by: husband Name of family member notified: husband Patient and family notified of of transfer: 12/17/20  Discharge Plan and Services                DME Arranged: 3-N-1 DME Agency: [facility] Date DME Agency Contacted: 12/16/20 Time DME Agency Contacted: [phone] Representative spoke with at DME Agency: Bjorn Loser HH Arranged: PT, RN [facility] Agency: [facility] Date HH Agency Contacted: 12/17/20 Time HH Agency Contacted: [phone] Representative spoke with at [facility] Agency: Barbara Cower  Social Determinants of Health (SDOH) Interventions     Readmission Risk Interventions No flowsheet data found.

## 2020-12-17 NOTE — Plan of Care (Signed)

## 2020-12-17 NOTE — Discharge Summary (Signed)
Physician Discharge Summary  Jenna Dudley UYQ:[id number] DOB: Oct 07, 1935 DOA: 12/10/2020  PCP: Jovita Kussmaul, MD  Admit date: 12/10/2020 Discharge date: 12/17/2020  Admitted From: Home  Discharge disposition: Home PT  Recommendations for Outpatient Follow-Up:   Follow up with your primary care provider in one week.  Check CBC, BMP, magnesium in the next visit Follow-up with cardiology as has been scheduled. Follow with PCP to repeat vitamin D level after 3 months.  Discharge Diagnosis:   Active Problems:   Essential hypertension   History of stroke   Chronic low back pain   Depression   Dementia (HCC)   Acute on chronic diastolic CHF (congestive heart failure) (HCC)   Partial small bowel obstruction (HCC)   CHF exacerbation ([place])   Discharge Condition: Improved.  Diet recommendation: Low sodium, heart healthy.  Wound care: None.  Code status: Full.   History of Present Illness:   Jenna Dudley is a 85 y.o. female with past medical history of chronic diastolic heart failure, bipolar disorder, hypertension, dementia, chronic low back pain presented to the hospital with abnormal CT scan concerning for partial small bowel obstruction.   She did have shortness of breath on exertion and orthopnea. In the ED, vitals were stable.  Hemoglobin was 9.5.   BNP elevated at 1480.  EKG showed normal sinus rhythm.  CT scan of the abdomen and pelvis showed dilated loops of bowel in the left upper quadrant with transition point.  Patient also had moderate right pleural effusion new from 11/24/2020.  Patient was given Lasix and patient was admitted hospital for further evaluation and treatment.  Hospital Course:   Following conditions were addressed during hospitalization as listed below,  Partial small bowel obstruction, resolved CT scan of the abdomen initially showed transition point.  There was no indication for surgical intervention.  Patient will need to continue on  bowel regimen on discharge.  Patient did have constipation issues which needed laxatives during hospitalization.    Fever secondary to UTI.   Has improved.  Received COVID vaccination today.  We will continue with Omnicef on discharge to complete 3-day course for cystitis.  Temperature max of 98.2 F..  Patient has mild leukopenia.   Acute urine retention Resolved.  Off Foley catheter   Acute on chronic diastolic CHF right-sided pleural effusion CT scan showed right-sided pleural effusion on initial presentation..    Continue metoprolol, losartan daily weights, intake and output charting.  2D echocardiogram shows LV ejection fraction of 65%.  Status post ultrasound-guided thoracentesis on 06/12/2020 with 800 mL of clear fluid.  Patient has been started on oral Lasix.  Seen by cardiology and patient is compensated at this time.  Continue p.o. Lasix on discharge.   Chronic Anemia Latest hemoglobin of 10.2.    Essential hypertension Continue metoprolol and losartan.  Blood pressure is stable.     Chronic low back pain Continue analgesics.     Depression, bipolar disorder Continue venlafaxine, Zyprexa and Remeron     Dementia  Continue supportive care.   Vitamin D deficiency, Continue vitamin D 50k units p.o. weekly.  Follow with PCP to repeat vitamin D level after 3 months.   Debility, deconditioning, chronic back pain.   Physical therapy has recommended skilled nursing facility placement but  husband wished home health on discharge.   Disposition PT recommends skilled nursing facility but patient's husband wishes her to go home.  Disposition.  At this time, patient is stable for disposition home with home  health.  Medical Consultants:   Interventional radiology  Procedures:    Ultrasound-guided thoracocentesis with removal of pleural fluid. Subjective:   Today, patient was seen and examined at bedside.  Denies interval complaints.  At her baseline.  No fever  reported.  Discharge Exam:   Vitals:   12/17/20 0230 12/17/20 0729  BP: (!) 96/55 (!) 124/57  Pulse: 91 81  Resp: 18 16  Temp: 97.9 F (36.6 C) 98.2 F (36.8 C)  SpO2: 99% 96%   Vitals:   12/16/20 2332 12/17/20 0230 12/17/20 0500 12/17/20 0729  BP: (!) 94/50 (!) 96/55  (!) 124/57  Pulse: 80 91  81  Resp: '16 18  16  ' Temp: 98.1 F (36.7 C) 97.9 F (36.6 C)  98.2 F (36.8 C)  TempSrc:      SpO2: 95% 99%  96%  Weight:   50.1 kg   Height:        General: Alert awake, not in obvious distress, thinly built, at baseline. HENT: pupils equally reacting to light,  No scleral pallor or icterus noted. Oral mucosa is moist.  Chest:  Diminished breath sounds bilaterally. CVS: S1 &S2 heard. No murmur.  Regular rate and rhythm. Abdomen: Soft, nontender, nondistended.  Bowel sounds are heard.   Extremities: No cyanosis, clubbing or edema.  Peripheral pulses are palpable. Psych: Alert, awake and communicative CNS:  No cranial nerve deficits.  Power equal in all extremities.   Skin: Warm and dry.  No rashes noted.  The results of significant diagnostics from this hospitalization (including imaging, microbiology, ancillary and laboratory) are listed below for reference.     Diagnostic Studies:   CT ABDOMEN PELVIS W CONTRAST  Result Date: 12/11/2020 CLINICAL DATA:  Abdominal distention EXAM: CT ABDOMEN AND PELVIS WITH CONTRAST TECHNIQUE: Multidetector CT imaging of the abdomen and pelvis was performed using the standard protocol following bolus administration of intravenous contrast. CONTRAST:  63m OMNIPAQUE IOHEXOL 350 MG/ML SOLN COMPARISON:  CT abdomen/pelvis 12/20/2017 FINDINGS: Lower chest: There is a moderate size right pleural effusion with adjacent consolidation. The heart is mildly enlarged. Coronary artery calcifications, aortic valve calcifications, and mitral annular calcifications are noted. There is no pericardial effusion. Hepatobiliary: The liver is heterogeneously enhancing  on the early phase images but homogeneous on the delayed phase, suggesting perfusional alterations. There are no focal parenchymal lesions. The gallbladder is surgically absent. There is new moderate intra and extrahepatic biliary ductal dilatation with the common bile duct measuring up to 1.1 cm. No radiopaque stone or other obstructing lesion is seen. Pancreas: The pancreas is atrophic but otherwise unremarkable. There is no focal lesion or contour abnormality. There is no main pancreatic ductal dilatation or peripancreatic inflammatory change. Spleen: Unremarkable. Adrenals/Urinary Tract: The adrenals are mildly thickened bilaterally with no focal lesion. The kidneys enhance symmetrically with symmetric excretion of contrast into the collecting system on the delayed phase images. There is no focal lesion or stone. There is no hydronephrosis or hydroureter. There is mild perinephric stranding bilaterally, nonspecific. The bladder is decompressed by a Foley catheter. Stomach/Bowel: There is a small hiatal hernia. The stomach is otherwise unremarkable. There is no evidence of bowel obstruction. There is a moderate stool burden throughout the colon. There is no abnormal bowel wall thickening or inflammatory change. Vascular/Lymphatic: There is extensive calcified atherosclerotic plaque throughout the nonaneurysmal abdominal aorta. The major branch vessels are patent. The main portal and splenic veins are patent. Reproductive: The uterus is surgically absent. There is no adnexal mass. Other: There  is trace free fluid in the presacral space, nonspecific. There is no organized or drainable fluid collection. There is no free air. Surgical clips are noted along the right ventral abdominal wall. Musculoskeletal: There is a subacute fracture of the right L2 transverse process, new since 12/20/2017. There is advanced multilevel degenerative change throughout the spine and of the hips, right worse than left. There is mild  compression deformity of the L1 vertebral body, similar to the prior CT. IMPRESSION: 1. Moderate intra and extrahepatic biliary ductal dilatation is increased since 12/20/2017. No focal lesion or stone is seen. This could be further evaluated with MRI/MRCP (this could be performed on an outpatient basis depending on level of clinical concern). 2. Moderate colonic stool burden without evidence of mechanical obstruction. 3. Moderate-size right pleural effusion. Consider dedicated imaging of the chest as indicated. 4. Mild cardiomegaly. Dense coronary artery calcifications, aortic valve calcifications, and mild mitral annular calcifications. 5. Trace free fluid in the presacral space, nonspecific. 6. Subacute appearing fracture of the right L2 transverse process. Aortic Atherosclerosis (ICD10-I70.0). Electronically Signed   By: Valetta Mole M.D.   On: 12/11/2020 13:54   DG Chest Port 1 View  Result Date: 12/12/2020 CLINICAL DATA:  Short of breath, poor historian EXAM: PORTABLE CHEST 1 VIEW COMPARISON:  None. FINDINGS: Enlarged cardiac silhouette. Central venous congestion. Low lung volumes. No focal consolidation. No pneumothorax IMPRESSION: Cardiomegaly with central venous congestion. Concern for congestive heart failure. Electronically Signed   By: Suzy Bouchard M.D.   On: 12/12/2020 13:10   ECHOCARDIOGRAM COMPLETE  Result Date: 12/11/2020    ECHOCARDIOGRAM REPORT   Patient Name:   VERLIA KANEY Date of Exam: 12/11/2020 Medical Rec #:  [id number]     Height:       60.0 in Accession #:    [id number]    Weight:       114.0 lb Date of Birth:  September 05, 1935     BSA:          1.470 m Patient Age:    8 years      BP:           110/50 mmHg Patient Gender: F             HR:           59 bpm. Exam Location:  [facility] Procedure: 2D Echo, Cardiac Doppler and Color Doppler Indications:     CHF-Acute Diastolic U20.25  History:         Patient has prior history of Echocardiogram examinations. CHF;                  Risk  Factors:Hypertension.  Sonographer:     Alyse Low Roar Referring Phys:  [id number] Athena Masse Diagnosing Phys: Ida Rogue MD IMPRESSIONS  1. Left ventricular ejection fraction, by estimation, is 60 to 65%. The left ventricle has normal function. The left ventricle has no regional wall motion abnormalities. There is mmoderate to severe left ventricular hypertrophy with near cavity obliteration in systole. Left ventricular diastolic parameters are consistent with Grade III diastolic dysfunction (restrictive).  2. Right ventricular systolic function is normal. The right ventricular size is normal. There is severely elevated pulmonary artery systolic pressure. The estimated right ventricular systolic pressure is 76.2 mmHg.  3. Left atrial size was mild to moderately dilated.  4. The mitral valve is normal in structure. Mild to moderate mitral valve regurgitation.  5. Tricuspid valve regurgitation is moderate.  6. The inferior vena cava is  dilated in size with >50% respiratory variability, suggesting right atrial pressure of 8 mmHg. FINDINGS  Left Ventricle: Left ventricular ejection fraction, by estimation, is 60 to 65%. The left ventricle has normal function. The left ventricle has no regional wall motion abnormalities. The left ventricular internal cavity size was normal in size. There is  severe left ventricular hypertrophy. Left ventricular diastolic parameters are consistent with Grade III diastolic dysfunction (restrictive). Right Ventricle: The right ventricular size is normal. No increase in right ventricular wall thickness. Right ventricular systolic function is normal. There is severely elevated pulmonary artery systolic pressure. The tricuspid regurgitant velocity is 3.77 m/s, and with an assumed right atrial pressure of 5 mmHg, the estimated right ventricular systolic pressure is 65.7 mmHg. Left Atrium: Left atrial size was mild to moderately dilated. Right Atrium: Right atrial size was normal in size.  Pericardium: There is no evidence of pericardial effusion. Mitral Valve: The mitral valve is normal in structure. Mild to moderate mitral valve regurgitation. No evidence of mitral valve stenosis. Tricuspid Valve: The tricuspid valve is normal in structure. Tricuspid valve regurgitation is moderate . No evidence of tricuspid stenosis. Aortic Valve: The aortic valve is normal in structure. Aortic valve regurgitation is not visualized. Mild aortic valve sclerosis is present, with no evidence of aortic valve stenosis. Aortic valve peak gradient measures 14.5 mmHg. Pulmonic Valve: The pulmonic valve was normal in structure. Pulmonic valve regurgitation is not visualized. No evidence of pulmonic stenosis. Aorta: The aortic root is normal in size and structure. Venous: The inferior vena cava is dilated in size with greater than 50% respiratory variability, suggesting right atrial pressure of 8 mmHg. IAS/Shunts: No atrial level shunt detected by color flow Doppler.  LEFT VENTRICLE PLAX 2D LVIDd:         3.70 cm  Diastology LVIDs:         2.50 cm  LV e' medial:    4.35 cm/s LV PW:         1.30 cm  LV E/e' medial:  30.1 LV IVS:        1.90 cm  LV e' lateral:   3.83 cm/s LVOT diam:     1.50 cm  LV E/e' lateral: 34.2 LVOT Area:     1.77 cm  RIGHT VENTRICLE RV Basal diam:  2.60 cm RV Mid diam:    2.30 cm RV S prime:     9.60 cm/s TAPSE (M-mode): 1.7 cm LEFT ATRIUM             Index       RIGHT ATRIUM           Index LA diam:        4.50 cm 3.06 cm/m  RA Area:     15.10 cm LA Vol (A2C):   81.6 ml 55.51 ml/m RA Volume:   37.00 ml  25.17 ml/m LA Vol (A4C):   72.3 ml 49.19 ml/m LA Biplane Vol: 77.6 ml 52.79 ml/m  AORTIC VALVE                PULMONIC VALVE AV Area (Vmax): 0.81 cm    PV Vmax:        0.91 m/s AV Vmax:        190.50 cm/s PV Peak grad:   3.3 mmHg AV Peak Grad:   14.5 mmHg   RVOT Peak grad: 2 mmHg LVOT Vmax:      87.30 cm/s  AORTA Ao Root diam: 2.40 cm MITRAL VALVE  TRICUSPID VALVE MV Area (PHT):  4.36 cm     TR Peak grad:   56.9 mmHg MV Decel Time: 174 msec     TR Vmax:        377.00 cm/s MV E velocity: 131.00 cm/s MV A velocity: 72.30 cm/s   SHUNTS MV E/A ratio:  1.81         Systemic Diam: 1.50 cm MV A Prime:    4.0 cm/s Ida Rogue MD Electronically signed by Ida Rogue MD Signature Date/Time: 12/11/2020/3:06:26 PM    Final      Labs:   Basic Metabolic Panel: Recent Labs  Lab 12/12/20 0703 12/13/20 0549 12/14/20 0449 12/15/20 0532 12/16/20 0539 12/17/20 0526  NA 138 138 137 136 134* 136  K 3.6 3.8 4.0 4.7 4.0 3.9  CL 100 103 102 96* 95* 96*  CO2 '29 31 31 30 30 29  ' GLUCOSE 83 85 97 97 98 78  BUN 24* 25* 19 17 26* 38*  CREATININE 0.94 0.90 0.78 0.86 1.15* 1.37*  CALCIUM 8.5* 8.3* 8.4* 9.1 8.5* 8.6*  MG 1.8 1.8 1.9  --  2.0 2.3  PHOS 4.6 4.2 4.1  --  5.3*  --    GFR Estimated Creatinine Clearance: 22.3 mL/min (A) (by C-G formula based on SCr of 1.37 mg/dL (H)). Liver Function Tests: Recent Labs  Lab 12/10/20 1958 12/13/20 0549  AST 19 14*  ALT 15 10  ALKPHOS 60 47  BILITOT 0.7 0.8  PROT 6.4* 5.7*  ALBUMIN 3.7 3.3*   No results for input(s): LIPASE, AMYLASE in the last 168 hours. No results for input(s): AMMONIA in the last 168 hours. Coagulation profile No results for input(s): INR, PROTIME in the last 168 hours.  CBC: Recent Labs  Lab 12/13/20 0549 12/14/20 0449 12/15/20 0532 12/16/20 0752 12/17/20 0526  WBC 3.4* 3.8* 6.6 3.1* 2.9*  HGB 9.5* 9.6* 11.6* 10.4* 10.2*  HCT 29.1* 28.8* 34.5* 30.2* 30.2*  MCV 113.2* 112.5* 111.7* 113.1* 114.4*  PLT 197 184 217 134* 123*   Cardiac Enzymes: No results for input(s): CKTOTAL, CKMB, CKMBINDEX, TROPONINI in the last 168 hours. BNP: Invalid input(s): POCBNP CBG: Recent Labs  Lab 12/14/20 2107  GLUCAP 88   D-Dimer No results for input(s): DDIMER in the last 72 hours. Hgb A1c No results for input(s): HGBA1C in the last 72 hours. Lipid Profile No results for input(s): CHOL, HDL, LDLCALC, TRIG,  CHOLHDL, LDLDIRECT in the last 72 hours. Thyroid function studies No results for input(s): TSH, T4TOTAL, T3FREE, THYROIDAB in the last 72 hours.  Invalid input(s): FREET3 Anemia work up No results for input(s): VITAMINB12, FOLATE, FERRITIN, TIBC, IRON, RETICCTPCT in the last 72 hours. Microbiology Recent Results (from the past 240 hour(s))  Resp Panel by RT-PCR (Flu A&B, Covid) Nasopharyngeal Swab     Status: None   Collection Time: 12/10/20 10:55 PM   Specimen: Nasopharyngeal Swab; Nasopharyngeal(NP) swabs in vial transport medium  Result Value Ref Range Status   SARS Coronavirus 2 by RT PCR NEGATIVE NEGATIVE Final    Comment: (NOTE) SARS-CoV-2 target nucleic acids are NOT DETECTED.  The SARS-CoV-2 RNA is generally detectable in upper respiratory specimens during the acute phase of infection. The lowest concentration of SARS-CoV-2 viral copies this assay can detect is 138 copies/mL. A negative result does not preclude SARS-Cov-2 infection and should not be used as the sole basis for treatment or other patient management decisions. A negative result may occur with  improper specimen collection/handling, submission of specimen other  than nasopharyngeal swab, presence of viral mutation(s) within the areas targeted by this assay, and inadequate number of viral copies(<138 copies/mL). A negative result must be combined with clinical observations, patient history, and epidemiological information. The expected result is Negative.  Fact Sheet for Patients:  EntrepreneurPulse.com.au  Fact Sheet for Healthcare Providers:  IncredibleEmployment.be  This test is no t yet approved or cleared by the [place] FDA and  has been authorized for detection and/or diagnosis of SARS-CoV-2 by FDA under an Emergency Use Authorization (EUA). This EUA will remain  in effect (meaning this test can be used) for the duration of the COVID-19 declaration under Section  564(b)(1) of the Act, 21 U.S.C.section 360bbb-3(b)(1), unless the authorization is terminated  or revoked sooner.       Influenza A by PCR NEGATIVE NEGATIVE Final   Influenza B by PCR NEGATIVE NEGATIVE Final    Comment: (NOTE) The Xpert Xpress SARS-CoV-2/FLU/RSV plus assay is intended as an aid in the diagnosis of influenza from Nasopharyngeal swab specimens and should not be used as a sole basis for treatment. Nasal washings and aspirates are unacceptable for Xpert Xpress SARS-CoV-2/FLU/RSV testing.  Fact Sheet for Patients: EntrepreneurPulse.com.au  Fact Sheet for Healthcare Providers: IncredibleEmployment.be  This test is not yet approved or cleared by the [place] FDA and has been authorized for detection and/or diagnosis of SARS-CoV-2 by FDA under an Emergency Use Authorization (EUA). This EUA will remain in effect (meaning this test can be used) for the duration of the COVID-19 declaration under Section 564(b)(1) of the Act, 21 U.S.C. section 360bbb-3(b)(1), unless the authorization is terminated or revoked.  Performed at [facility], [address] [place], [place] [address]   Body fluid culture w Gram Stain     Status: None   Collection Time: 12/13/20 11:15 AM   Specimen: PATH Cytology Pleural fluid  Result Value Ref Range Status   Specimen Description   Final    PLEURAL Performed at [facility], [address] [place], [place] [address]    Special Requests   Final    NONE Performed at [facility], [address] [place], [place] [address]    Gram Stain   Final    WBC PRESENT, PREDOMINANTLY MONONUCLEAR NO ORGANISMS SEEN CYTOSPIN SMEAR    Culture   Final    NO GROWTH Performed at [facility], [place] [address], [place], [place] [address]    Report Status 12/16/2020 FINAL  Final     Discharge Instructions:   Discharge Instructions     (HEART FAILURE PATIENTS) Call MD:   Anytime you have any of the following symptoms: 1) 3 pound weight gain in 24 hours or 5 pounds in 1 week 2) shortness of breath, with or without a dry hacking cough 3) swelling in the hands, feet or stomach 4) if you have to sleep on extra pillows at night in order to breathe.   Complete by: As directed    Avoid straining   Complete by: As directed    Diet - low sodium heart healthy   Complete by: As directed    Fluid restriction 1553m/day   Discharge instructions   Complete by: As directed    Follow-up with your primary care physician in 1 week.  Follow-up with cardiology as outpatient.  Continue to take medications as prescribed (please note change in doses).  Do not Overexert.  Continue physical therapy at home.   Heart Failure patients record your daily weight  using the same scale at the same time of day   Complete by: As directed    Increase activity slowly   Complete by: As directed    STOP any activity that causes chest pain, shortness of breath, dizziness, sweating, or exessive weakness   Complete by: As directed       Allergies as of 12/17/2020       Reactions   Aspirin Other (See Comments)   Unknown reaction   Sulfa Antibiotics Other (See Comments)   Unknown reaction        Medication List     TAKE these medications    acetaminophen-codeine 300-30 MG tablet Commonly known as: TYLENOL #3 Take 1 tablet by mouth every 8 (eight) hours as needed for moderate pain or severe pain.   Acidophilus/Pectin Caps Take by mouth.   cefdinir 300 MG capsule Commonly known as: OMNICEF Take 1 capsule (300 mg total) by mouth 2 (two) times daily for 3 days.   cholecalciferol 1000 units tablet Commonly known as: VITAMIN D Take 1,000 Units by mouth daily.   ferrous sulfate 325 (65 FE) MG EC tablet Take 325 mg by mouth daily with breakfast.   furosemide 20 MG tablet Commonly known as: LASIX Take 2 tablets (40 mg total) by mouth daily. Start taking on: December 18, 2020 What  changed: how much to take   losartan 25 MG tablet Commonly known as: COZAAR Take 1 tablet (25 mg total) by mouth daily.   magnesium oxide 400 MG tablet Commonly known as: MAG-OX Take 400 mg by mouth daily.   metoprolol tartrate 100 MG tablet Commonly known as: LOPRESSOR Take 1 tablet (100 mg total) by mouth 2 (two) times daily. What changed:  medication strength how much to take   mirtazapine 45 MG tablet Commonly known as: REMERON Take 1 tablet (45 mg total) by mouth at bedtime.   naloxone 4 MG/0.1ML Liqd nasal spray kit Commonly known as: NARCAN SMARTSIG:1 Spray(s) Both Nares Daily PRN   OLANZapine 20 MG tablet Commonly known as: ZYPREXA Take 1 tablet (20 mg total) by mouth at bedtime.   omeprazole 20 MG capsule Commonly known as: PRILOSEC Take 20 mg by mouth 2 (two) times daily before a meal.   polyethylene glycol 17 g packet Commonly known as: MIRALAX / GLYCOLAX Take 17 g by mouth daily as needed for moderate constipation or mild constipation (if not helped with stool softner).   PROBIOTIC DAILY PO Take by mouth daily.   senna-docusate 8.6-50 MG tablet Commonly known as: Senokot-S Take 1 tablet by mouth 2 (two) times daily. Stool softener   simvastatin 40 MG tablet Commonly known as: ZOCOR Take 40 mg by mouth at bedtime.   venlafaxine XR 150 MG 24 hr capsule Commonly known as: EFFEXOR-XR 1 po daily   vitamin B-12 500 MCG tablet Commonly known as: CYANOCOBALAMIN Take 1 tablet (500 mcg total) by mouth daily.   Vitamin D (Ergocalciferol) 1.25 MG (50000 UNIT) Caps capsule Commonly known as: DRISDOL Take 1 capsule (50,000 Units total) by mouth every 7 (seven) days for 7 doses. Start taking on: December 19, 2020               Durable Medical Equipment  (From admission, onward)           Start     Ordered   12/16/20 1334  For home use only DME 3 n 1  Once        12/16/20 1333  Follow-up Information     Aderoju, Jadene Pierini, MD Follow up on 12/24/2020.   Specialty: Internal Medicine Why: regular followup, blood work..@ 11:20am Contact information: [place] [address] [phone]         Wellington Hampshire, MD Follow up on 12/23/2020.   Specialty: Cardiology Why: @ 10:55am Contact information: [place] [address] [phone]                  Time coordinating discharge: 39 minutes  Signed:  Shanette Tamargo  [facility] 12/17/2020, 10:27 AM

## 2020-12-17 NOTE — Progress Notes (Signed)
   12/15/20 1442  Assess: MEWS Score  Temp (!) 102.4 F (39.1 C)  BP (!) 146/89  Pulse Rate 96  Resp 17  SpO2 96 %  O2 Device Room Air  Assess: MEWS Score  MEWS Temp 2  MEWS Systolic 0  MEWS Pulse 0  MEWS RR 0  MEWS LOC 0  MEWS Score 2  MEWS Score Color Yellow  Assess: if the MEWS score is Yellow or Red  Were vital signs taken at a resting state? Yes  Focused Assessment No change from prior assessment  Does the patient meet 2 or more of the SIRS criteria? No  Does the patient have a confirmed or suspected source of infection? No  Provider and Rapid Response Notified? Yes  MEWS guidelines implemented *See Row Information* Yes  Treat  MEWS Interventions Administered prn meds/treatments  Pain Scale 0-10  Pain Score 0  Take Vital Signs  Increase Vital Sign Frequency  Yellow: Q 2hr X 2 then Q 4hr X 2, if remains yellow, continue Q 4hrs  Escalate  MEWS: Escalate Yellow: discuss with charge nurse/RN and consider discussing with provider and RRT  Notify: Charge Nurse/RN  Name of Charge Nurse/RN Notified stephanie summers  Date Charge Nurse/RN Notified 12/15/20  Time Charge Nurse/RN Notified 1446  Assess: SIRS CRITERIA  SIRS Temperature  1  SIRS Pulse 1  SIRS Respirations  0  SIRS WBC 0  SIRS Score Sum  2

## 2020-12-18 LAB — URINE CULTURE: Culture: >=100000 — AB

## 2020-12-21 ENCOUNTER — Ambulatory Visit: Payer: TRICARE For Life (TFL) | Admitting: Family

## 2020-12-23 ENCOUNTER — Ambulatory Visit: Payer: TRICARE For Life (TFL) | Admitting: Nurse Practitioner

## 2020-12-26 NOTE — Progress Notes (Signed)
Cardiology Office Note:    Date:  12/27/2020   ID:  Jenna Dudley, DOB [birth year], MRN [id number]  PCP:  Aderoju, Marin Olp, MD  [facility] Cardiologist:  Lorine Bears, MD  [facility] Electrophysiologist:  None   Referring MD: Nadyne Coombes*   Chief Complaint: hospital CHF/SOB follow-up  History of Present Illness:    Jenna Dudley is a 85 y.o. female with a hx of chronic diastolic CHF, bipolar disorder (requiring ECT), HTN, GERD, early dementia who is being seen 12/11/20 for the evaluation of CHF.  Previous echocardiogram January 2019 LVEF 55 to 60%, grade 1 diastolic dysfunction, mild MR.  She underwent [facility] February 2019 with no evidence of ischemia though study was suboptimal.  Recently admitted 9/16-9/23. Presented with abnormal CT for partial small bowel obstruction. Did have DOE, Hgb 9.5. CT showed mod right pleural effusion. GI saw and recommended conservative  management for bowel. Echo showed LV 65%, She underwent thoracentesis on the right side negative clear fluid. Patient was started on lasix.   Today, patient's husband reports patient is up and down. Some days appetite is good, some days it's poor. Trying to get patient to drink more. Daily weights 105lbs>113lbs since discharge. Think this is from food, no LLE reported. Unsure if she has been taking lasix, the husband said he never received this medication. Before she went into the hospital, PCP had discontinued the lasix. Supposed to be on 40mg  daily of lasix. Will see PCP tomorrow. No chest pain, worsening sob, orthopnea, LLE, dizziness, lightheadedness, fever, chills.   Past Medical History:  Diagnosis Date   Bilateral carpal tunnel syndrome 07/27/2014   Bipolar disorder (HCC)    geropsych admission to [facility] in Dec 2018   Chronic diastolic CHF (congestive heart failure) (HCC)    a. 03/2017 Echo: EF 55-60%, no rwma, Gr1 DD, mild MR; b. 06/2019 Echo: EF 55-60%, no rwma, mod  LVH, GrII DD. Nl RV size/fxn. RVSP 54.71mmHg. Mildly dil LA.   Chronic low back pain 07/29/2014   Degenerative arthritis of lumbar spine 07/27/2014   Dementia (HCC)    Depression    Esophageal spasm 07/29/2014   GERD (gastroesophageal reflux disease)    History of stress test    a. 04/2017 MV: low risk stress test.  EF 55-65%. Probable apical ant/apical, basal, and mid inflat attenuation artifact vs ischemia/scar.   Hyperlipemia 07/29/2014   Hypertension    Pneumonia    Spinal stenosis 07/29/2014    Past Surgical History:  Procedure Laterality Date   HIP SURGERY Right    INNER EAR SURGERY Right    REPLACEMENT TOTAL KNEE BILATERAL Bilateral 07/27/14    Current Medications: Current Meds  Medication Sig   acetaminophen-codeine (TYLENOL #3) 300-30 MG per tablet Take 1 tablet by mouth every 8 (eight) hours as needed for moderate pain or severe pain.   cholecalciferol (VITAMIN D) 1000 units tablet Take 1,000 Units by mouth daily.   ferrous sulfate 325 (65 FE) MG EC tablet Take 325 mg by mouth daily with breakfast.   Lactobacillus Acid-Pectin (ACIDOPHILUS/PECTIN) CAPS Take by mouth.   losartan (COZAAR) 25 MG tablet Take 1 tablet (25 mg total) by mouth daily.   magnesium oxide (MAG-OX) 400 MG tablet Take 400 mg by mouth daily.   metoprolol tartrate (LOPRESSOR) 100 MG tablet Take 1 tablet (100 mg total) by mouth 2 (two) times daily.   mirtazapine (REMERON) 45 MG tablet Take 1 tablet (45 mg total) by mouth at bedtime.  naloxone (NARCAN) nasal spray 4 mg/0.1 mL SMARTSIG:1 Spray(s) Both Nares Daily PRN   OLANZapine (ZYPREXA) 20 MG tablet Take 1 tablet (20 mg total) by mouth at bedtime.   omeprazole (PRILOSEC) 20 MG capsule Take 20 mg by mouth 2 (two) times daily before a meal.    polyethylene glycol (MIRALAX / GLYCOLAX) 17 g packet Take 17 g by mouth daily as needed for moderate constipation or mild constipation (if not helped with stool softner).   Probiotic Product (PROBIOTIC DAILY PO) Take  by mouth daily.   senna-docusate (SENOKOT-S) 8.6-50 MG tablet Take 1 tablet by mouth 2 (two) times daily. Stool softener   simvastatin (ZOCOR) 40 MG tablet Take 40 mg by mouth at bedtime.    venlafaxine XR (EFFEXOR-XR) 150 MG 24 hr capsule 1 po daily   vitamin B-12 (CYANOCOBALAMIN) 500 MCG tablet Take 1 tablet (500 mcg total) by mouth daily.   Vitamin D, Ergocalciferol, (DRISDOL) 1.25 MG (50000 UNIT) CAPS capsule Take 1 capsule (50,000 Units total) by mouth every 7 (seven) days for 7 doses.     Allergies:   Aspirin and Sulfa antibiotics   Social History   Socioeconomic History   Marital status: Married    Spouse name: Not on file   Number of children: Not on file   Years of education: Not on file   Highest education level: Not on file  Occupational History    Comment: retired  Tobacco Use   Smoking status: Former    Types: Cigarettes    Quit date: 07/29/1975    Years since quitting: 45.4   Smokeless tobacco: Never  Vaping Use   Vaping Use: Never used  Substance and Sexual Activity   Alcohol use: No    Alcohol/week: 0.0 standard drinks   Drug use: No   Sexual activity: Never    Comment: postmenopause  Other Topics Concern   Not on file  Social History Narrative   Not on file   Social Determinants of Health   Financial Resource Strain: Not on file  Food Insecurity: Not on file  Transportation Needs: Not on file  Physical Activity: Not on file  Stress: Not on file  Social Connections: Not on file     Family History: The patient's family history includes Hypertension in her mother; Stroke in her mother.  ROS:   Please see the history of present illness.     All other systems reviewed and are negative.  EKGs/Labs/Other Studies Reviewed:    The following studies were reviewed today:  Echo 11/2020 1. Left ventricular ejection fraction, by estimation, is 60 to 65%. The  left ventricle has normal function. The left ventricle has no regional  wall motion  abnormalities. There is mmoderate to severe left ventricular  hypertrophy with near cavity  obliteration in systole. Left ventricular diastolic parameters are  consistent with Grade III diastolic dysfunction (restrictive).   2. Right ventricular systolic function is normal. The right ventricular  size is normal. There is severely elevated pulmonary artery systolic  pressure. The estimated right ventricular systolic pressure is 61.9 mmHg.   3. Left atrial size was mild to moderately dilated.   4. The mitral valve is normal in structure. Mild to moderate mitral valve  regurgitation.   5. Tricuspid valve regurgitation is moderate.   6. The inferior vena cava is dilated in size with >50% respiratory  variability, suggesting right atrial pressure of 8 mmHg.   Myoview Lexiscan 04/2017 Narrative & Impression  Low risk, probably normal  myocardial perfusion stress test. There is a small in size, moderate in severity, partial reversible defect involving the apical anterior and apical segments that may represent artifact (attenuation and apical thinning) but cannot rule out ischemia and scar. There is a small in size, mild in severity, fixed basal and mid inferolateral defect most likely representing artifact (attenuation and misregistration) and less likely scar. The left ventricular ejection fraction is normal (55-65%) with normal regional wall motion.    EKG:  EKG is  ordered today.  The ekg ordered today demonstrates SB, 58bpm, TWI inferior leads, old septal infarct, no changes  Recent Labs: 12/13/2020: ALT 10 12/15/2020: B Natriuretic Peptide 927.3 12/17/2020: BUN 38; Creatinine, Ser 1.37; Hemoglobin 10.2; Magnesium 2.3; Platelets 123; Potassium 3.9; Sodium 136  Recent Lipid Panel    Component Value Date/Time   CHOL 149 09/18/2014 0655   CHOL 144 03/30/2012 0512   TRIG 94 09/18/2014 0655   TRIG 103 03/30/2012 0512   HDL 33 (L) 09/18/2014 0655   HDL 33 (L) 03/30/2012 0512   CHOLHDL 4.5  09/18/2014 0655   VLDL 19 09/18/2014 0655   VLDL 21 03/30/2012 0512   LDLCALC 97 09/18/2014 0655   LDLCALC 90 03/30/2012 0512   Physical Exam:    VS:  BP (!) 110/56 (BP Location: Left Arm, Patient Position: Sitting, Cuff Size: Normal)   Pulse (!) 58   Ht 5' (1.524 m)   Wt 116 lb (52.6 kg)   LMP  (LMP Unknown)   SpO2 96%   BMI 22.65 kg/m     Wt Readings from Last 3 Encounters:  12/27/20 116 lb (52.6 kg)  12/17/20 110 lb 7.2 oz (50.1 kg)  08/03/20 121 lb (54.9 kg)     GEN:  Well nourished, well developed in no acute distress HEENT: Normal NECK: No JVD; No carotid bruits LYMPHATICS: No lymphadenopathy CARDIAC: RRR, no murmurs, rubs, gallops RESPIRATORY:  diminished on the right  ABDOMEN: Soft, non-tender, non-distended MUSCULOSKELETAL:  No edema; No deformity  SKIN: Warm and dry NEUROLOGIC:  Alert and oriented x 3 PSYCHIATRIC:  Normal affect   ASSESSMENT:    1. Chronic diastolic heart failure (HCC)   2. Pulmonary hypertension, unspecified (HCC)   3. Essential hypertension   4. Small bowel obstruction (HCC)   5. Dementia, unspecified dementia severity, unspecified dementia type, unspecified whether behavioral, psychotic, or mood disturbance or anxiety (HCC)    PLAN:    In order of problems listed above:  HFpEF Pulmonary HTN Right pleural effusion s/p thoracentesis Patient's husband reports patient has been doing well since discharge. No significant shortness of breath. Husband reports she has not been taking lasix 40mg  daily (As prescribed on dc summary). Says he never received the medication. No LLE, Right lower lung with dminished breath sounds, no JVD. Most recent labs showed elevated Scr/BUN. Re-check labs today, BMET and BNP. If ok start lasix 20mg  daily. Continue Lopressor 100mg  daily and Losartan 25mg  daily.   HL LDL 65 10/2020. Continue simvastatin  HTN BP good. H/o soft pressures, recommend checking bp at home. Continue Toprol 100mg  daily and Losartan 25mg   daily. Patient denies dizziness or lightheadedness.  Dementia At baseline  SBO Husband reports patient has good and bad days, weight is up 105>113lbs, suspect this is mainly from food intake.   Disposition: Follow up in 1 month(s) with MD/APP   Signed, Janaye Corp , PA-C  12/27/2020 11:34 AM    [facility]

## 2020-12-27 ENCOUNTER — Encounter: Payer: Self-pay | Admitting: Medical

## 2020-12-27 ENCOUNTER — Ambulatory Visit (INDEPENDENT_AMBULATORY_CARE_PROVIDER_SITE_OTHER): Payer: Medicare Other | Admitting: Medical

## 2020-12-27 ENCOUNTER — Other Ambulatory Visit: Payer: Self-pay

## 2020-12-27 VITALS — BP 110/56 | HR 58 | Ht 60.0 in | Wt 116.0 lb

## 2020-12-27 DIAGNOSIS — K56609 Unspecified intestinal obstruction, unspecified as to partial versus complete obstruction: Secondary | ICD-10-CM

## 2020-12-27 DIAGNOSIS — I272 Pulmonary hypertension, unspecified: Secondary | ICD-10-CM

## 2020-12-27 DIAGNOSIS — I10 Essential (primary) hypertension: Secondary | ICD-10-CM

## 2020-12-27 DIAGNOSIS — I5032 Chronic diastolic (congestive) heart failure: Secondary | ICD-10-CM | POA: Diagnosis not present

## 2020-12-27 DIAGNOSIS — J90 Pleural effusion, not elsewhere classified: Secondary | ICD-10-CM

## 2020-12-27 DIAGNOSIS — F0390 Unspecified dementia without behavioral disturbance: Secondary | ICD-10-CM

## 2020-12-27 NOTE — Patient Instructions (Signed)
Medication Instructions:  Your physician recommends that you continue on your current medications as directed. Please refer to the Current Medication list given to you today.  *If you need a refill on your cardiac medications before your next appointment, please call your pharmacy*   Lab Work: Bmp and Bnp today  If you have labs (blood work) drawn today and your tests are completely normal, you will receive your results only by: MyChart Message (if you have MyChart) OR A paper copy in the mail If you have any lab test that is abnormal or we need to change your treatment, we will call you to review the results.   Testing/Procedures: None ordered   Follow-Up: At [facility], you and your health needs are our priority.  As part of our continuing mission to provide you with exceptional heart care, we have created designated Provider Care Teams.  These Care Teams include your primary Cardiologist (physician) and Advanced Practice Providers (APPs -  Physician Assistants and Nurse Practitioners) who all work together to provide you with the care you need, when you need it.  We recommend signing up for the patient portal called "MyChart".  Sign up information is provided on this After Visit Summary.  MyChart is used to connect with patients for Virtual Visits (Telemedicine).  Patients are able to view lab/test results, encounter notes, upcoming appointments, etc.  Non-urgent messages can be sent to your provider as well.   To learn more about what you can do with MyChart, go to [email or web address].    Your next appointment:   4 week(s)  The format for your next appointment:   In Person  Provider:   You may see Lorine Bears, MD or one of the following Advanced Practice Providers on your designated Care Team:   Nicolasa Ducking, NP Eula Listen, PA-C Marisue Ivan, PA-C Cadence Fransico Michael, [place]   Other Instructions N/A

## 2020-12-28 LAB — BASIC METABOLIC PANEL
BUN/Creatinine Ratio: 17 (ref 12–28)
BUN: 18 mg/dL (ref 8–27)
CO2: 21 mmol/L (ref 20–29)
Calcium: 8.8 mg/dL (ref 8.7–10.3)
Chloride: 104 mmol/L (ref 96–106)
Creatinine, Ser: 1.06 mg/dL — ABNORMAL HIGH (ref 0.57–1.00)
Glucose: 83 mg/dL (ref 70–99)
Potassium: 4.8 mmol/L (ref 3.5–5.2)
Sodium: 140 mmol/L (ref 134–144)
eGFR: 52 mL/min/{1.73_m2} — ABNORMAL LOW (ref >59)

## 2020-12-29 ENCOUNTER — Other Ambulatory Visit: Payer: Self-pay

## 2020-12-29 ENCOUNTER — Ambulatory Visit: Payer: Medicare Other | Attending: Family | Admitting: Family

## 2020-12-29 ENCOUNTER — Encounter: Payer: Self-pay | Admitting: Family

## 2020-12-29 VITALS — BP 99/56 | HR 58 | Resp 18 | Ht 60.0 in | Wt 114.2 lb

## 2020-12-29 DIAGNOSIS — F319 Bipolar disorder, unspecified: Secondary | ICD-10-CM | POA: Diagnosis not present

## 2020-12-29 DIAGNOSIS — K219 Gastro-esophageal reflux disease without esophagitis: Secondary | ICD-10-CM | POA: Insufficient documentation

## 2020-12-29 DIAGNOSIS — Z79899 Other long term (current) drug therapy: Secondary | ICD-10-CM | POA: Diagnosis not present

## 2020-12-29 DIAGNOSIS — Z87891 Personal history of nicotine dependence: Secondary | ICD-10-CM | POA: Diagnosis not present

## 2020-12-29 DIAGNOSIS — Z8249 Family history of ischemic heart disease and other diseases of the circulatory system: Secondary | ICD-10-CM | POA: Insufficient documentation

## 2020-12-29 DIAGNOSIS — I5032 Chronic diastolic (congestive) heart failure: Secondary | ICD-10-CM | POA: Diagnosis not present

## 2020-12-29 DIAGNOSIS — Z7901 Long term (current) use of anticoagulants: Secondary | ICD-10-CM | POA: Insufficient documentation

## 2020-12-29 DIAGNOSIS — I110 Hypertensive heart disease with heart failure: Secondary | ICD-10-CM | POA: Diagnosis not present

## 2020-12-29 DIAGNOSIS — R5383 Other fatigue: Secondary | ICD-10-CM | POA: Diagnosis present

## 2020-12-29 DIAGNOSIS — Z09 Encounter for follow-up examination after completed treatment for conditions other than malignant neoplasm: Secondary | ICD-10-CM | POA: Diagnosis not present

## 2020-12-29 DIAGNOSIS — E785 Hyperlipidemia, unspecified: Secondary | ICD-10-CM | POA: Insufficient documentation

## 2020-12-29 DIAGNOSIS — I10 Essential (primary) hypertension: Secondary | ICD-10-CM

## 2020-12-29 NOTE — Progress Notes (Signed)
Patient ID: Jenna Dudley, female    DOB: March 31, 1935, 85 y.o.   MRN: [id number]  Mrs. Rae is a 85 y/o female with a history of hyperlipidemia, HTN, spinal stenosis, GERD, depression, bipolar, dementia, remote tobacco use and chronic heart failure.   Admitted 9/16-23 with a partial SBO, UTI, and acute on chronic HF with right sided pleural effusion requiring a thoracentesis.   Echo from 12/11/20 - Left ventricular ejection fraction, by estimation, is 60 to 65%. The left ventricle has normal function. The left ventricle has no regional  wall motion abnormalities. There is mmoderate to severe left ventricular hypertrophy with near cavity  obliteration in systole.  BNP on 12/15/20 - 927  She presents today for a follow-up visit with a chief complaint of moderate fatigue upon minimal exertion. This resolves with rest. Her husband reports that she is still fatigued from her recent hospital admission and she does not have her energy level back. They deny increased pedal edema, chest pain, palpitations.   Past Medical History:  Diagnosis Date   Bilateral carpal tunnel syndrome 07/27/2014   Bipolar disorder (HCC)    geropsych admission to [facility] in Dec 2018   Chronic diastolic CHF (congestive heart failure) (HCC)    a. 03/2017 Echo: EF 55-60%, no rwma, Gr1 DD, mild MR; b. 06/2019 Echo: EF 55-60%, no rwma, mod LVH, GrII DD. Nl RV size/fxn. RVSP 54.75mmHg. Mildly dil LA.   Chronic low back pain 07/29/2014   Degenerative arthritis of lumbar spine 07/27/2014   Dementia (HCC)    Depression    Esophageal spasm 07/29/2014   GERD (gastroesophageal reflux disease)    History of stress test    a. 04/2017 MV: low risk stress test.  EF 55-65%. Probable apical ant/apical, basal, and mid inflat attenuation artifact vs ischemia/scar.   Hyperlipemia 07/29/2014   Hypertension    Pneumonia    Spinal stenosis 07/29/2014   Past Surgical History:  Procedure Laterality Date   HIP SURGERY Right    INNER EAR  SURGERY Right    REPLACEMENT TOTAL KNEE BILATERAL Bilateral 07/27/14   Family History  Problem Relation Age of Onset   Hypertension Mother    Stroke Mother    Social History   Tobacco Use   Smoking status: Former    Types: Cigarettes    Quit date: 07/29/1975    Years since quitting: 45.4   Smokeless tobacco: Never  Substance Use Topics   Alcohol use: No    Alcohol/week: 0.0 standard drinks   Allergies  Allergen Reactions   Aspirin Other (See Comments)    Unknown reaction   Sulfa Antibiotics Other (See Comments)    Unknown reaction   Prior to Admission medications   Medication Sig Start Date End Date Taking? Authorizing Provider  acetaminophen-codeine (TYLENOL #3) 300-30 MG per tablet Take 1 tablet by mouth every 8 (eight) hours as needed for moderate pain or severe pain.   Yes [provider]  furosemide (LASIX) 20 MG tablet Take 1 tablet (20 mg total) by mouth daily. 04/23/17  Yes Enid Baas, MD  losartan (COZAAR) 25 MG tablet Take 1 tablet (25 mg total) by mouth daily. 09/26/14  Yes Pucilowska, Jolanta B, MD  loxapine (LOXITANE) 25 MG capsule Take 1 capsule (25 mg total) by mouth at bedtime. 03/29/17  Yes Clapacs, Jackquline Denmark, MD  metoprolol tartrate (LOPRESSOR) 50 MG tablet Take 1 tablet (50 mg total) by mouth 2 (two) times daily. 05/01/17 07/30/17 Yes Creig Hines, NP  mirtazapine (REMERON) 45 MG tablet Take 1 tablet (45 mg total) by mouth at bedtime. 03/29/17  Yes Clapacs, Jackquline Denmark, MD  omeprazole (PRILOSEC) 20 MG capsule Take 20 mg by mouth 2 (two) times daily before a meal.    Yes [provider]  simvastatin (ZOCOR) 40 MG tablet Take 40 mg by mouth at bedtime.    Yes [provider]  venlafaxine XR (EFFEXOR-XR) 150 MG 24 hr capsule Take 1 capsule (150 mg total) by mouth daily with breakfast. 03/29/17  Yes Clapacs, Jackquline Denmark, MD   Review of Systems  Constitutional:  Positive for appetite change (increased since hospital dc but still not at baseline)  and fatigue (tires easily). Negative for fever.  HENT:  Negative for congestion, postnasal drip and sore throat.   Eyes: Negative.   Respiratory:  Positive for shortness of breath (at times). Negative for cough, chest tightness and wheezing.   Cardiovascular:  Negative for chest pain, palpitations and leg swelling.  Gastrointestinal:  Negative for abdominal distention and abdominal pain.  Endocrine: Negative.   Genitourinary: Negative.  Negative for difficulty urinating, dysuria and frequency.  Musculoskeletal:  Positive for back pain. Negative for neck pain.  Skin: Negative.   Allergic/Immunologic: Negative.   Neurological:  Negative for dizziness, syncope and light-headedness.  Hematological:  Negative for adenopathy. Does not bruise/bleed easily.  Psychiatric/Behavioral:  Negative for dysphoric mood and sleep disturbance (sleeping on 2 pillows). The patient is nervous/anxious.   Vitals:   12/29/20 1020  BP: (!) 99/56  Pulse: (!) 58  Resp: 18  SpO2: 100%  Weight: 114 lb 4 oz (51.8 kg)  Height: 5' (1.524 m)   Wt Readings from Last 3 Encounters:  12/29/20 114 lb 4 oz (51.8 kg)  12/27/20 116 lb (52.6 kg)  12/17/20 110 lb 7.2 oz (50.1 kg)   Lab Results  Component Value Date   CREATININE 1.06 (H) 12/27/2020   CREATININE 1.37 (H) 12/17/2020   CREATININE 1.15 (H) 12/16/2020   Physical Exam Vitals and nursing note reviewed. Exam conducted with a chaperone present.  Constitutional:      Appearance: She is well-developed.  HENT:     Head: Normocephalic and atraumatic.  Neck:     Vascular: No carotid bruit or JVD.  Cardiovascular:     Rate and Rhythm: Regular rhythm. Bradycardia present.     Comments: Extra beats auscultated  Pulmonary:     Effort: Pulmonary effort is normal.     Breath sounds: No wheezing or rales.  Abdominal:     General: There is no distension.     Palpations: Abdomen is soft.     Tenderness: There is no abdominal tenderness.  Musculoskeletal:         General: No tenderness.     Cervical back: Normal range of motion and neck supple.  Skin:    General: Skin is warm and dry.     Coloration: Skin is pale. Skin is not jaundiced.  Neurological:     Mental Status: She is alert and oriented to person, place, and time.  Psychiatric:        Behavior: Behavior normal.     Comments: Alert and oriented, calm but anxious appearing, defers to her husband to answer questions, repeats that she is "scared and wants to go home"   Assessment & Plan:  1: Chronic heart failure with preserved ejection fraction- - NYHA class III - euvolemic - weighing daily and her husband says that her weight has been stable, 110-114  lbs. Reminded to call for an overnight weight gain of >2 pounds or a weekly weight gain of >5 pounds.\ - at cardiology appointment on 12/27/20, wt 114 correlating with weight in the clinic - not adding salt and her husband says that he's been reading food labels. Reviewed the importance of closely following a 2000mg  sodium diet - saw cardiology 12/27/20 - BNP 12/15/20 927 - unsure of lasix dosage, they did not bring their medications, but husband states she started taking it again after MD visit on Monday - BP cannot tolerate adding ARNi or SGLT-2  2: HTN-  - saw PCP for hospital d/c f/u on 12/28/20 - BP in clinic 99/56 - husband concerned it may be too low, readings are consistent with their home records as well as at other appointments - she denies any weakness, dizziness, syncope, or pre-syncope   3: Bipolar- - will see therapist Thursday and Friday of this week    Patient did not bring her medications nor a list. Each medication was verbally reviewed with the patient and her husband.  Did not make a follow-up appointment. Advised patient and her husband that they could call back at anytime in the future to schedule another appointment.

## 2020-12-29 NOTE — Patient Instructions (Signed)
Call the heart failure clinic as needed.  Call for wt gain of > 2 lbs over night or 5 lbs within a week, or worsening SOB.

## 2020-12-31 LAB — BRAIN NATRIURETIC PEPTIDE: BNP: 947.6 pg/mL — ABNORMAL HIGH (ref 0.0–100.0)

## 2021-01-03 ENCOUNTER — Telehealth: Payer: Self-pay | Admitting: Medical

## 2021-01-03 DIAGNOSIS — Z79899 Other long term (current) drug therapy: Secondary | ICD-10-CM

## 2021-01-03 NOTE — Telephone Encounter (Signed)
I called and spoke with Mr. Nunziata in regards to the patient's lab results. I inquired if the patient is currently taking any lasix at all. Per Mr. Knack, he pulled the medication bottle and she is currently taking lasix 40 mg once daily.  Per Cadence Fransico Michael, PA's office note from 12/27/20: HFpEF Pulmonary HTN Right pleural effusion s/p thoracentesis Patient's husband reports patient has been doing well since discharge. No significant shortness of breath. Husband reports she has not been taking lasix 40mg  daily (As prescribed on dc summary). Says he never received the medication. No LLE, Right lower lung with dminished breath sounds, no JVD. Most recent labs showed elevated Scr/BUN. Re-check labs today, BMET and BNP. If ok start lasix 20mg  daily. Continue Lopressor 100mg  daily and Losartan 25mg  daily.   I inquired from Mr. Difranco when the patient started on lasix 40 mg once daily. He advised this was around 12/19/20 (the patient was discharged on 12/17/20).  The patient saw , NP ([facility]) on 12/29/20 & per her note: - unsure of lasix dosage, they did not bring their medications, but husband states she started taking it again after MD visit on Monday  I advised I will forward to Cadence Keenes, Clarisa Kindred to review. After receiving conflicting information from the patient's spouse in comparison to 10/3 & 10/5 office notes, will see if repeat lab work is still needed at this time as she may have been on lasix 40 mg once daily at the time of this lab draw.  Mr. Cumberland is aware we will call back with further recommendations. Otherwise, he is encouraged to continue the patient's current medications at this time and keep her follow up appointment as scheduled on 01/28/21. Mr Vezina voices understanding and is agreeable.

## 2021-01-03 NOTE — Telephone Encounter (Signed)
Cadence David Stall, PA-C  01/03/2021 12:57 PM EDT     BNP elevated. Lasix 20mg  should have been already started based on prior labs.

## 2021-01-03 NOTE — Telephone Encounter (Signed)
Cadence David Stall, PA-C  12/30/2020  2:56 PM EDT     Labs show improved kidney function. Lets start lasix 20 mg daily. BMET in a week.

## 2021-01-03 NOTE — Telephone Encounter (Signed)
Alyson Ingles, LPN  51/09/15  3:20 PM EDT     [id number]

## 2021-01-06 NOTE — Telephone Encounter (Signed)
Was able to reach out to Mrs. Lowy, she gave verbal consent to speak to her husband [name]. Following-up on earlier conversation with Herbert Seta, RN regarding Lasix, Cadence Fransico Michael, PA-C was able to respond with   If patient is taking lasix 40mg  that is ok, we will just need follow-up BMET in a week to make sure kidney function is ok.    verbalized pt is taking Lasix 40 mg daily, agrees to BMET in one week.  Schedule 10/20 at 10 am for labs at office.  Suggested at appt to have DPR sign that way in future we may talk to husband regarding pt's medical information.   11/20 very thankful for getting back with him and Mrs. Dieu, all questions were address and no additional concerns at this time. Agreeable to plan, will call back for anything further.

## 2021-01-13 ENCOUNTER — Other Ambulatory Visit (INDEPENDENT_AMBULATORY_CARE_PROVIDER_SITE_OTHER): Payer: Medicare Other

## 2021-01-13 ENCOUNTER — Other Ambulatory Visit: Payer: Self-pay

## 2021-01-13 DIAGNOSIS — Z79899 Other long term (current) drug therapy: Secondary | ICD-10-CM

## 2021-01-14 LAB — BASIC METABOLIC PANEL
BUN/Creatinine Ratio: 26 (ref 12–28)
BUN: 28 mg/dL — ABNORMAL HIGH (ref 8–27)
CO2: 26 mmol/L (ref 20–29)
Calcium: 9.2 mg/dL (ref 8.7–10.3)
Chloride: 99 mmol/L (ref 96–106)
Creatinine, Ser: 1.09 mg/dL — ABNORMAL HIGH (ref 0.57–1.00)
Glucose: 95 mg/dL (ref 70–99)
Potassium: 4.7 mmol/L (ref 3.5–5.2)
Sodium: 141 mmol/L (ref 134–144)
eGFR: 50 mL/min/{1.73_m2} — ABNORMAL LOW (ref >59)

## 2021-01-28 ENCOUNTER — Other Ambulatory Visit: Payer: Self-pay

## 2021-01-28 ENCOUNTER — Ambulatory Visit (INDEPENDENT_AMBULATORY_CARE_PROVIDER_SITE_OTHER): Payer: Medicare Other | Admitting: Medical

## 2021-01-28 ENCOUNTER — Encounter: Payer: Self-pay | Admitting: Medical

## 2021-01-28 VITALS — BP 110/60 | HR 60 | Ht 60.0 in | Wt 113.1 lb

## 2021-01-28 DIAGNOSIS — E782 Mixed hyperlipidemia: Secondary | ICD-10-CM

## 2021-01-28 DIAGNOSIS — I5032 Chronic diastolic (congestive) heart failure: Secondary | ICD-10-CM | POA: Diagnosis not present

## 2021-01-28 DIAGNOSIS — I10 Essential (primary) hypertension: Secondary | ICD-10-CM | POA: Diagnosis not present

## 2021-01-28 DIAGNOSIS — J90 Pleural effusion, not elsewhere classified: Secondary | ICD-10-CM

## 2021-01-28 DIAGNOSIS — I272 Pulmonary hypertension, unspecified: Secondary | ICD-10-CM

## 2021-01-28 DIAGNOSIS — K56609 Unspecified intestinal obstruction, unspecified as to partial versus complete obstruction: Secondary | ICD-10-CM

## 2021-01-28 DIAGNOSIS — F0390 Unspecified dementia without behavioral disturbance: Secondary | ICD-10-CM | POA: Diagnosis not present

## 2021-01-28 NOTE — Progress Notes (Signed)
Cardiology Office Note:    Date:  01/28/2021   ID:  Eulis Foster, DOB April 19, 1935, MRN [id number]  PCP:  Aderoju, Jadene Pierini, MD  [facility] Cardiologist:  Kathlyn Sacramento, MD  [facility] Electrophysiologist:  None   Referring MD: Matthias Hughs*   Chief Complaint: 4 week follow-up  History of Present Illness:    Jenna Dudley is a 85 y.o. female with a hx of chronic diastolic CHF, bipolar disorder (requiring ECT), HTN, GERD, early dementia who is being seen 12/11/20 for the evaluation of CHF.   Previous echocardiogram January 2019 LVEF 55 to [id number], grade 1 diastolic dysfunction, mild MR.  She underwent [facility] February 2019 with no evidence of ischemia though study was suboptimal.   Admitted 9/16-9/23. Presented with abnormal CT for partial small bowel obstruction. Did have DOE, Hgb 9.5. CT showed mod right pleural effusion. GI saw and recommended conservative  management for bowel. Echo showed LV 65%, She underwent thoracentesis on the right side negative 871mL clear fluid. Patient was started on lasix.   Last seen 12/27/20 and patient had good and bad days since discharge. Unsure if she was on the lasix. Since labs were good she was started on Lasix 20mg  daily.   Today, the patient reports shortness of breath. Feels breathing is improving however. She is walking more. Also appetite is coming back, back to baseline. No chest pain, Lle, orthopnea, palpitations. Reports some pain in her legs, possible related to herniated disc in the back. She has PT at home for another couple weeks.    Past Medical History:  Diagnosis Date   Bilateral carpal tunnel syndrome 07/27/2014   Bipolar disorder ([place])    geropsych admission to [facility] in Dec 2018   Chronic diastolic CHF (congestive heart failure) ([place])    a. 03/2017 Echo: EF 55-60%, no rwma, Gr1 DD, mild MR; b. 06/2019 Echo: EF 55-60%, no rwma, mod LVH, GrII DD. Nl RV size/fxn. RVSP 54.50mmHg. Mildly dil LA.    Chronic low back pain 07/29/2014   Degenerative arthritis of lumbar spine 07/27/2014   Dementia ([place])    Depression    Esophageal spasm 07/29/2014   GERD (gastroesophageal reflux disease)    History of stress test    a. 04/2017 MV: low risk stress test.  EF 55-65%. Probable apical ant/apical, basal, and mid inflat attenuation artifact vs ischemia/scar.   Hyperlipemia 07/29/2014   Hypertension    Pneumonia    Spinal stenosis 07/29/2014    Past Surgical History:  Procedure Laterality Date   HIP SURGERY Right    INNER EAR SURGERY Right    REPLACEMENT TOTAL KNEE BILATERAL Bilateral 07/27/14    Current Medications: No outpatient medications have been marked as taking for the 01/28/21 encounter (Office Visit) with Kathlen Mody, Valentin Benney H, PA-C.     Allergies:   Aspirin and Sulfa antibiotics   Social History   Socioeconomic History   Marital status: Married    Spouse name: Not on file   Number of children: Not on file   Years of education: Not on file   Highest education level: Not on file  Occupational History    Comment: retired  Tobacco Use   Smoking status: Former    Types: Cigarettes    Quit date: 07/29/1975    Years since quitting: 45.5   Smokeless tobacco: Never  Vaping Use   Vaping Use: Never used  Substance and Sexual Activity   Alcohol use: No    Alcohol/week: 0.0 standard  drinks   Drug use: No   Sexual activity: Never    Comment: postmenopause  Other Topics Concern   Not on file  Social History Narrative   Not on file   Social Determinants of Health   Financial Resource Strain: Not on file  Food Insecurity: Not on file  Transportation Needs: Not on file  Physical Activity: Not on file  Stress: Not on file  Social Connections: Not on file     Family History: The patient's family history includes Hypertension in her mother; Stroke in her mother.  ROS:   Please see the history of present illness.     All other systems reviewed and are  negative.  EKGs/Labs/Other Studies Reviewed:    The following studies were reviewed today:    Echo 11/2020 1. Left ventricular ejection fraction, by estimation, is 60 to 65%. The  left ventricle has normal function. The left ventricle has no regional  wall motion abnormalities. There is mmoderate to severe left ventricular  hypertrophy with near cavity  obliteration in systole. Left ventricular diastolic parameters are  consistent with Grade III diastolic dysfunction (restrictive).   2. Right ventricular systolic function is normal. The right ventricular  size is normal. There is severely elevated pulmonary artery systolic  pressure. The estimated right ventricular systolic pressure is [id number] mmHg.   3. Left atrial size was mild to moderately dilated.   4. The mitral valve is normal in structure. Mild to moderate mitral valve  regurgitation.   5. Tricuspid valve regurgitation is moderate.   6. The inferior vena cava is dilated in size with >50% respiratory  variability, suggesting right atrial pressure of 8 mmHg.    Myoview Lexiscan 04/2017 Narrative & Impression  Low risk, probably normal myocardial perfusion stress test. There is a small in size, moderate in severity, partial reversible defect involving the apical anterior and apical segments that may represent artifact (attenuation and apical thinning) but cannot rule out ischemia and scar. There is a small in size, mild in severity, fixed basal and mid inferolateral defect most likely representing artifact (attenuation and misregistration) and less likely scar. The left ventricular ejection fraction is normal (55-65%) with normal regional wall motion.      EKG:  EKG is  ordered today.  The ekg ordered today demonstrates NSR, 60bpm, ST/Twave changes inferior leads, PACs  Recent Labs: 12/13/2020: ALT 10 12/17/2020: Hemoglobin 10.2; Magnesium 2.3; Platelets 123 12/27/2020: BNP 947.6 01/13/2021: BUN 28; Creatinine, Ser 1.09; Potassium  4.7; Sodium 141  Recent Lipid Panel    Component Value Date/Time   CHOL 149 09/18/2014 0655   CHOL 144 03/30/2012 0512   TRIG 94 09/18/2014 0655   TRIG 103 03/30/2012 0512   HDL 33 (L) 09/18/2014 0655   HDL 33 (L) 03/30/2012 0512   CHOLHDL 4.5 09/18/2014 0655   VLDL 19 09/18/2014 0655   VLDL 21 03/30/2012 0512   LDLCALC 97 09/18/2014 0655   LDLCALC 90 03/30/2012 0512     Physical Exam:    VS:  BP 110/60 (BP Location: Left Arm, Patient Position: Sitting, Cuff Size: Normal)   Pulse 60   Ht 5' (1.524 m)   Wt 113 lb 2 oz (51.3 kg)   LMP  (LMP Unknown)   SpO2 96%   BMI 22.09 kg/m     Wt Readings from Last 3 Encounters:  01/28/21 113 lb 2 oz (51.3 kg)  12/29/20 114 lb 4 oz (51.8 kg)  12/27/20 116 lb (52.6 kg)  GEN:  Well nourished, well developed in no acute distress HEENT: Normal NECK: No JVD; No carotid bruits LYMPHATICS: No lymphadenopathy CARDIAC: RRR, no murmurs, rubs, gallops RESPIRATORY:  mildly decreased on the right  ABDOMEN: Soft, non-tender, non-distended MUSCULOSKELETAL:  No edema; No deformity  SKIN: Warm and dry NEUROLOGIC:  Alert and oriented x 3 PSYCHIATRIC:  Normal affect   ASSESSMENT:    1. Chronic diastolic heart failure (HCC)   2. Hyperlipidemia, mixed   3. Essential hypertension   4. Dementia, unspecified dementia severity, unspecified dementia type, unspecified whether behavioral, psychotic, or mood disturbance or anxiety (HCC)   5. Small bowel obstruction (HCC)   6. Pulmonary hypertension, unspecified (HCC)   7. Pleural effusion    PLAN:    In order of problems listed above:  HFpEF Pulmonary HTN Right pleural effusion s/p thoracentesis Started on lasix 20mg  at the last visit. Follow-up kidney function stable, BNP elevated. Reports breathing is still short at times, but overall improving. Appears euvolemic on exam today. Will check a BMET. Continue Toprol and Losartan.   HL LDL 65 10/2020. Continue simvastatin.  HTN BP good  today. Continue Losartan 25mg  daily, Lopressor 100mg  BID.   Dementia At baseline.   SBO Reports patent is eating and drinking normally.   Disposition: Follow up in 3 month(s) with MD/APP    Signed, Divon Krabill 11/2020, PA-C  01/28/2021 10:20 AM    [facility]

## 2021-01-28 NOTE — Patient Instructions (Signed)
Medication Instructions:  No changes at this time.  *If you need a refill on your cardiac medications before your next appointment, please call your pharmacy*   Lab Work: Bmet today  If you have labs (blood work) drawn today and your tests are completely normal, you will receive your results only by: MyChart Message (if you have MyChart) OR A paper copy in the mail If you have any lab test that is abnormal or we need to change your treatment, we will call you to review the results.   Testing/Procedures: None   Follow-Up: At [facility], you and your health needs are our priority.  As part of our continuing mission to provide you with exceptional heart care, we have created designated Provider Care Teams.  These Care Teams include your primary Cardiologist (physician) and Advanced Practice Providers (APPs -  Physician Assistants and Nurse Practitioners) who all work together to provide you with the care you need, when you need it.   Your next appointment:   3 month(s)  The format for your next appointment:   In Person  Provider:   You may see Lorine Bears, MD or one of the following Advanced Practice Providers on your designated Care Team:   Nicolasa Ducking, NP Eula Listen, PA-C Cadence Fransico Michael, [place]

## 2021-01-29 LAB — BASIC METABOLIC PANEL
BUN/Creatinine Ratio: 28 (ref 12–28)
BUN: 30 mg/dL — ABNORMAL HIGH (ref 8–27)
CO2: 20 mmol/L (ref 20–29)
Calcium: 9.3 mg/dL (ref 8.7–10.3)
Chloride: 103 mmol/L (ref 96–106)
Creatinine, Ser: 1.08 mg/dL — ABNORMAL HIGH (ref 0.57–1.00)
Glucose: 106 mg/dL — ABNORMAL HIGH (ref 70–99)
Potassium: 5.3 mmol/L — ABNORMAL HIGH (ref 3.5–5.2)
Sodium: 138 mmol/L (ref 134–144)
eGFR: 51 mL/min/{1.73_m2} — ABNORMAL LOW (ref >59)

## 2021-01-31 ENCOUNTER — Telehealth: Payer: Self-pay | Admitting: Medical

## 2021-01-31 DIAGNOSIS — I5033 Acute on chronic diastolic (congestive) heart failure: Secondary | ICD-10-CM

## 2021-01-31 NOTE — Telephone Encounter (Signed)
DPR on file. Patients husband made aware of lab results. Pt husband confirms that the patient is not taking any potasium supplements.  Patient will have a repeat bmp on 02/07/21. Lab appt scheduled. Pt husband verbalized understanding and voiced appreciation for the call.

## 2021-02-07 ENCOUNTER — Other Ambulatory Visit: Payer: Self-pay

## 2021-02-07 ENCOUNTER — Other Ambulatory Visit (INDEPENDENT_AMBULATORY_CARE_PROVIDER_SITE_OTHER): Payer: Medicare Other

## 2021-02-07 DIAGNOSIS — I5033 Acute on chronic diastolic (congestive) heart failure: Secondary | ICD-10-CM | POA: Diagnosis not present

## 2021-02-08 ENCOUNTER — Telehealth: Payer: Self-pay | Admitting: Medical

## 2021-02-08 LAB — BASIC METABOLIC PANEL
BUN/Creatinine Ratio: 18 (ref 12–28)
BUN: 21 mg/dL (ref 8–27)
CO2: 25 mmol/L (ref 20–29)
Calcium: 9.1 mg/dL (ref 8.7–10.3)
Chloride: 100 mmol/L (ref 96–106)
Creatinine, Ser: 1.19 mg/dL — ABNORMAL HIGH (ref 0.57–1.00)
Glucose: 77 mg/dL (ref 70–99)
Potassium: 4.5 mmol/L (ref 3.5–5.2)
Sodium: 140 mmol/L (ref 134–144)
eGFR: 45 mL/min/{1.73_m2} — ABNORMAL LOW (ref >59)

## 2021-02-08 NOTE — Telephone Encounter (Signed)
Attempted to call the patient/ her husband with results. No answer- I left a detailed message on the cell voice mail (ok per DPR). I asked that they call back with any further questions/ concerns.

## 2021-02-08 NOTE — Telephone Encounter (Signed)
Annia Belt, RN  02/08/2021  1:50 PM EST     Results released to MyChart

## 2021-02-08 NOTE — Telephone Encounter (Signed)
Cadence David Stall, PA-C  02/08/2021  1:41 PM EST     Kidney function stable

## 2021-04-10 ENCOUNTER — Inpatient Hospital Stay
Admission: EM | Admit: 2021-04-10 | Discharge: 2021-04-14 | DRG: 178 | Disposition: A | Discharge status: Home Health | Payer: Medicare Other | Attending: Internal Medicine | Admitting: Internal Medicine

## 2021-04-10 ENCOUNTER — Encounter: Payer: Self-pay | Admitting: Emergency Medicine

## 2021-04-10 ENCOUNTER — Other Ambulatory Visit: Payer: Self-pay

## 2021-04-10 ENCOUNTER — Emergency Department: Payer: Medicare Other

## 2021-04-10 DIAGNOSIS — F0393 Unspecified dementia, unspecified severity, with mood disturbance: Secondary | ICD-10-CM | POA: Diagnosis present

## 2021-04-10 DIAGNOSIS — I10 Essential (primary) hypertension: Secondary | ICD-10-CM

## 2021-04-10 DIAGNOSIS — F319 Bipolar disorder, unspecified: Secondary | ICD-10-CM | POA: Diagnosis not present

## 2021-04-10 DIAGNOSIS — L97519 Non-pressure chronic ulcer of other part of right foot with unspecified severity: Secondary | ICD-10-CM | POA: Diagnosis present

## 2021-04-10 DIAGNOSIS — R0989 Other specified symptoms and signs involving the circulatory and respiratory systems: Secondary | ICD-10-CM

## 2021-04-10 DIAGNOSIS — E785 Hyperlipidemia, unspecified: Secondary | ICD-10-CM

## 2021-04-10 DIAGNOSIS — N183 Chronic kidney disease, stage 3 unspecified: Secondary | ICD-10-CM | POA: Diagnosis present

## 2021-04-10 DIAGNOSIS — Z79899 Other long term (current) drug therapy: Secondary | ICD-10-CM

## 2021-04-10 DIAGNOSIS — K219 Gastro-esophageal reflux disease without esophagitis: Secondary | ICD-10-CM | POA: Diagnosis present

## 2021-04-10 DIAGNOSIS — Z882 Allergy status to sulfonamides status: Secondary | ICD-10-CM

## 2021-04-10 DIAGNOSIS — G4710 Hypersomnia, unspecified: Secondary | ICD-10-CM | POA: Diagnosis present

## 2021-04-10 DIAGNOSIS — L089 Local infection of the skin and subcutaneous tissue, unspecified: Secondary | ICD-10-CM

## 2021-04-10 DIAGNOSIS — E872 Acidosis, unspecified: Secondary | ICD-10-CM | POA: Diagnosis present

## 2021-04-10 DIAGNOSIS — I5032 Chronic diastolic (congestive) heart failure: Secondary | ICD-10-CM

## 2021-04-10 DIAGNOSIS — U071 COVID-19: Secondary | ICD-10-CM | POA: Diagnosis not present

## 2021-04-10 DIAGNOSIS — N1831 Chronic kidney disease, stage 3a: Secondary | ICD-10-CM | POA: Diagnosis not present

## 2021-04-10 DIAGNOSIS — D509 Iron deficiency anemia, unspecified: Secondary | ICD-10-CM | POA: Insufficient documentation

## 2021-04-10 DIAGNOSIS — M2041 Other hammer toe(s) (acquired), right foot: Secondary | ICD-10-CM | POA: Diagnosis present

## 2021-04-10 DIAGNOSIS — Z87891 Personal history of nicotine dependence: Secondary | ICD-10-CM

## 2021-04-10 DIAGNOSIS — Z886 Allergy status to analgesic agent status: Secondary | ICD-10-CM

## 2021-04-10 DIAGNOSIS — N179 Acute kidney failure, unspecified: Secondary | ICD-10-CM | POA: Diagnosis present

## 2021-04-10 DIAGNOSIS — E860 Dehydration: Secondary | ICD-10-CM | POA: Diagnosis present

## 2021-04-10 DIAGNOSIS — F32A Depression, unspecified: Secondary | ICD-10-CM | POA: Diagnosis present

## 2021-04-10 DIAGNOSIS — I130 Hypertensive heart and chronic kidney disease with heart failure and stage 1 through stage 4 chronic kidney disease, or unspecified chronic kidney disease: Secondary | ICD-10-CM | POA: Diagnosis present

## 2021-04-10 DIAGNOSIS — R700 Elevated erythrocyte sedimentation rate: Secondary | ICD-10-CM

## 2021-04-10 DIAGNOSIS — L03031 Cellulitis of right toe: Secondary | ICD-10-CM

## 2021-04-10 DIAGNOSIS — Z8249 Family history of ischemic heart disease and other diseases of the circulatory system: Secondary | ICD-10-CM

## 2021-04-10 DIAGNOSIS — F03918 Unspecified dementia, unspecified severity, with other behavioral disturbance: Secondary | ICD-10-CM | POA: Diagnosis present

## 2021-04-10 DIAGNOSIS — R52 Pain, unspecified: Secondary | ICD-10-CM

## 2021-04-10 DIAGNOSIS — L03115 Cellulitis of right lower limb: Secondary | ICD-10-CM | POA: Diagnosis present

## 2021-04-10 DIAGNOSIS — R7989 Other specified abnormal findings of blood chemistry: Secondary | ICD-10-CM

## 2021-04-10 DIAGNOSIS — M869 Osteomyelitis, unspecified: Secondary | ICD-10-CM | POA: Diagnosis present

## 2021-04-10 DIAGNOSIS — E876 Hypokalemia: Secondary | ICD-10-CM

## 2021-04-10 DIAGNOSIS — Z96653 Presence of artificial knee joint, bilateral: Secondary | ICD-10-CM | POA: Diagnosis present

## 2021-04-10 DIAGNOSIS — Z8673 Personal history of transient ischemic attack (TIA), and cerebral infarction without residual deficits: Secondary | ICD-10-CM

## 2021-04-10 LAB — BLOOD GAS, VENOUS
Acid-Base Excess: 7 mmol/L — ABNORMAL HIGH (ref 0.0–2.0)
Bicarbonate: 30.1 mmol/L — ABNORMAL HIGH (ref 20.0–28.0)
O2 Saturation: 86.9 %
Patient temperature: 37
pCO2, Ven: 36 mmHg — ABNORMAL LOW (ref 44.0–60.0)
pH, Ven: 7.53 — ABNORMAL HIGH (ref 7.250–7.430)
pO2, Ven: 46 mmHg — ABNORMAL HIGH (ref 32.0–45.0)

## 2021-04-10 LAB — COMPREHENSIVE METABOLIC PANEL
ALT: 9 U/L (ref 0–44)
AST: 27 U/L (ref 15–41)
Albumin: 3.7 g/dL (ref 3.5–5.0)
Alkaline Phosphatase: 53 U/L (ref 38–126)
Anion gap: 17 — ABNORMAL HIGH (ref 5–15)
BUN: 16 mg/dL (ref 8–23)
CO2: 25 mmol/L (ref 22–32)
Calcium: 8.9 mg/dL (ref 8.9–10.3)
Chloride: 94 mmol/L — ABNORMAL LOW (ref 98–111)
Creatinine, Ser: 1.02 mg/dL — ABNORMAL HIGH (ref 0.44–1.00)
GFR, Estimated: 55 mL/min — ABNORMAL LOW (ref >60)
Glucose, Bld: 126 mg/dL — ABNORMAL HIGH (ref 70–99)
Potassium: 3 mmol/L — ABNORMAL LOW (ref 3.5–5.1)
Sodium: 136 mmol/L (ref 135–145)
Total Bilirubin: 0.9 mg/dL (ref 0.3–1.2)
Total Protein: 7.1 g/dL (ref 6.5–8.1)

## 2021-04-10 LAB — LACTIC ACID, PLASMA
Lactic Acid, Venous: 6.9 mmol/L (ref 0.5–1.9)
Lactic Acid, Venous: 3.2 mmol/L (ref 0.5–1.9)
Lactic Acid, Venous: 2.1 mmol/L (ref 0.5–1.9)
Lactic Acid, Venous: 4 mmol/L (ref 0.5–1.9)

## 2021-04-10 LAB — CBC WITH DIFFERENTIAL/PLATELET
Abs Immature Granulocytes: 0.03 10*3/uL (ref 0.00–0.07)
Basophils Absolute: 0.1 10*3/uL (ref 0.0–0.1)
Basophils Relative: 1 %
Eosinophils Absolute: 0 10*3/uL (ref 0.0–0.5)
Eosinophils Relative: 0 %
HCT: 30.8 % — ABNORMAL LOW (ref 36.0–46.0)
Hemoglobin: 10 g/dL — ABNORMAL LOW (ref 12.0–15.0)
Immature Granulocytes: 0 %
Lymphocytes Relative: 12 %
Lymphs Abs: 1.3 10*3/uL (ref 0.7–4.0)
MCH: 37.5 pg — ABNORMAL HIGH (ref 26.0–34.0)
MCHC: 32.5 g/dL (ref 30.0–36.0)
MCV: 115.4 fL — ABNORMAL HIGH (ref 80.0–100.0)
Monocytes Absolute: 0.7 10*3/uL (ref 0.1–1.0)
Monocytes Relative: 6 %
Neutro Abs: 8.8 10*3/uL — ABNORMAL HIGH (ref 1.7–7.7)
Neutrophils Relative %: 81 %
Platelets: 301 10*3/uL (ref 150–400)
RBC: 2.67 MIL/uL — ABNORMAL LOW (ref 3.87–5.11)
RDW: 13.5 % (ref 11.5–15.5)
Smear Review: NORMAL
WBC: 10.9 10*3/uL — ABNORMAL HIGH (ref 4.0–10.5)
nRBC: 0 % (ref 0.0–0.2)

## 2021-04-10 LAB — PROCALCITONIN: Procalcitonin: <0.1 ng/mL

## 2021-04-10 LAB — C-REACTIVE PROTEIN: CRP: 6.9 mg/dL — ABNORMAL HIGH (ref <1.0)

## 2021-04-10 LAB — APTT: aPTT: 33 seconds (ref 24–36)

## 2021-04-10 LAB — PROTIME-INR
INR: 1.2 (ref 0.8–1.2)
Prothrombin Time: 15.1 seconds (ref 11.4–15.2)

## 2021-04-10 LAB — RESP PANEL BY RT-PCR (FLU A&B, COVID) ARPGX2
Influenza A by PCR: NEGATIVE
Influenza B by PCR: NEGATIVE
SARS Coronavirus 2 by RT PCR: POSITIVE — AB

## 2021-04-10 LAB — BRAIN NATRIURETIC PEPTIDE: B Natriuretic Peptide: 1275.8 pg/mL — ABNORMAL HIGH (ref 0.0–100.0)

## 2021-04-10 LAB — MAGNESIUM: Magnesium: 1.6 mg/dL — ABNORMAL LOW (ref 1.7–2.4)

## 2021-04-10 LAB — SEDIMENTATION RATE: Sed Rate: 109 mm/hr — ABNORMAL HIGH (ref 0–30)

## 2021-04-10 MED ORDER — VITAMIN B-12 1000 MCG PO TABS
500.0000 ug | ORAL_TABLET | Freq: Every day | ORAL | Status: DC
  Administered 2021-04-10 – 2021-04-14 (×5): 500 ug via ORAL
  Filled 2021-04-10 (×6): qty 1

## 2021-04-10 MED ORDER — SODIUM CHLORIDE 0.9 % IV SOLN
1.0000 g | Freq: Once | INTRAVENOUS | Status: AC
  Administered 2021-04-10: 1 g via INTRAVENOUS
  Filled 2021-04-10: qty 1

## 2021-04-10 MED ORDER — RISAQUAD PO CAPS
1.0000 | ORAL_CAPSULE | Freq: Every day | ORAL | Status: DC
  Administered 2021-04-10 – 2021-04-14 (×5): 1 via ORAL
  Filled 2021-04-10 (×5): qty 1

## 2021-04-10 MED ORDER — LACTATED RINGERS IV BOLUS
250.0000 mL | Freq: Once | INTRAVENOUS | Status: AC
  Administered 2021-04-10: 250 mL via INTRAVENOUS

## 2021-04-10 MED ORDER — SODIUM CHLORIDE 0.9 % IV SOLN
1.0000 g | INTRAVENOUS | Status: DC
  Administered 2021-04-11 – 2021-04-12 (×2): 1 g via INTRAVENOUS
  Filled 2021-04-10 (×2): qty 10

## 2021-04-10 MED ORDER — LORAZEPAM 2 MG/ML IJ SOLN: 1.0000 mg | Freq: Three times a day (TID) | INTRAMUSCULAR | Status: DC | PRN

## 2021-04-10 MED ORDER — LORAZEPAM 2 MG/ML IJ SOLN: 1.0000 mg | Freq: Four times a day (QID) | INTRAMUSCULAR | Status: DC | PRN

## 2021-04-10 MED ORDER — VITAMIN D 25 MCG (1000 UNIT) PO TABS
1000.0000 [IU] | ORAL_TABLET | Freq: Every day | ORAL | Status: DC
  Administered 2021-04-10 – 2021-04-14 (×5): 1000 [IU] via ORAL
  Filled 2021-04-10 (×5): qty 1

## 2021-04-10 MED ORDER — FENTANYL CITRATE PF 50 MCG/ML IJ SOSY
12.5000 ug | PREFILLED_SYRINGE | Freq: Once | INTRAMUSCULAR | Status: AC
  Administered 2021-04-10: 12.5 ug via INTRAVENOUS
  Filled 2021-04-10: qty 1

## 2021-04-10 MED ORDER — PANTOPRAZOLE SODIUM 40 MG PO TBEC
40.0000 mg | DELAYED_RELEASE_TABLET | Freq: Every day | ORAL | Status: DC
  Administered 2021-04-10 – 2021-04-14 (×5): 40 mg via ORAL
  Filled 2021-04-10 (×5): qty 1

## 2021-04-10 MED ORDER — ACETAMINOPHEN 325 MG PO TABS
650.0000 mg | ORAL_TABLET | Freq: Four times a day (QID) | ORAL | Status: DC | PRN
  Administered 2021-04-10 – 2021-04-12 (×2): 650 mg via ORAL
  Filled 2021-04-10 (×2): qty 2

## 2021-04-10 MED ORDER — HEPARIN SODIUM (PORCINE) 5000 UNIT/ML IJ SOLN
5000.0000 [IU] | Freq: Three times a day (TID) | INTRAMUSCULAR | Status: DC
  Administered 2021-04-10 – 2021-04-14 (×12): 5000 [IU] via SUBCUTANEOUS
  Filled 2021-04-10 (×12): qty 1

## 2021-04-10 MED ORDER — OLANZAPINE 5 MG PO TABS
20.0000 mg | ORAL_TABLET | Freq: Every day | ORAL | Status: DC
  Administered 2021-04-10 – 2021-04-13 (×4): 20 mg via ORAL
  Filled 2021-04-10 (×4): qty 4

## 2021-04-10 MED ORDER — MAGNESIUM OXIDE 400 MG PO TABS
400.0000 mg | ORAL_TABLET | Freq: Every day | ORAL | Status: DC
  Administered 2021-04-10 – 2021-04-13 (×4): 400 mg via ORAL
  Filled 2021-04-10 (×9): qty 1

## 2021-04-10 MED ORDER — ONDANSETRON HCL 4 MG/2ML IJ SOLN: 4.0000 mg | Freq: Three times a day (TID) | INTRAMUSCULAR | Status: DC | PRN

## 2021-04-10 MED ORDER — PROBIOTIC DAILY PO CAPS: ORAL_CAPSULE | Freq: Every day | ORAL | Status: DC

## 2021-04-10 MED ORDER — VENLAFAXINE HCL ER 150 MG PO CP24
150.0000 mg | ORAL_CAPSULE | Freq: Every day | ORAL | Status: DC
  Administered 2021-04-11 – 2021-04-14 (×4): 150 mg via ORAL
  Filled 2021-04-10 (×5): qty 1

## 2021-04-10 MED ORDER — VANCOMYCIN HCL IN DEXTROSE 1-5 GM/200ML-% IV SOLN
1000.0000 mg | Freq: Once | INTRAVENOUS | Status: AC
  Administered 2021-04-11: 1000 mg via INTRAVENOUS
  Filled 2021-04-10 (×2): qty 200

## 2021-04-10 MED ORDER — FENTANYL CITRATE PF 50 MCG/ML IJ SOSY: 12.5000 ug | PREFILLED_SYRINGE | Freq: Once | INTRAMUSCULAR | Status: DC

## 2021-04-10 MED ORDER — OXYCODONE-ACETAMINOPHEN 5-325 MG PO TABS
1.0000 | ORAL_TABLET | ORAL | Status: DC | PRN
  Administered 2021-04-10 – 2021-04-14 (×2): 1 via ORAL
  Filled 2021-04-10 (×2): qty 1

## 2021-04-10 MED ORDER — HYDRALAZINE HCL 20 MG/ML IJ SOLN: 5.0000 mg | INTRAMUSCULAR | Status: DC | PRN

## 2021-04-10 MED ORDER — ONDANSETRON HCL 4 MG/2ML IJ SOLN
4.0000 mg | Freq: Once | INTRAMUSCULAR | Status: AC
Start: 2021-04-10 — End: 2021-04-10
  Administered 2021-04-10: 4 mg via INTRAVENOUS
  Filled 2021-04-10: qty 2

## 2021-04-10 MED ORDER — SENNOSIDES-DOCUSATE SODIUM 8.6-50 MG PO TABS
1.0000 | ORAL_TABLET | Freq: Two times a day (BID) | ORAL | Status: DC
  Administered 2021-04-10 – 2021-04-14 (×9): 1 via ORAL
  Filled 2021-04-10 (×9): qty 1

## 2021-04-10 MED ORDER — SIMVASTATIN 20 MG PO TABS
40.0000 mg | ORAL_TABLET | Freq: Every day | ORAL | Status: DC
  Administered 2021-04-10 – 2021-04-13 (×4): 40 mg via ORAL
  Filled 2021-04-10 (×4): qty 2

## 2021-04-10 MED ORDER — LACTATED RINGERS IV SOLN: INTRAVENOUS | Status: DC

## 2021-04-10 MED ORDER — POLYETHYLENE GLYCOL 3350 17 G PO PACK: 17.0000 g | PACK | Freq: Every day | ORAL | Status: DC | PRN

## 2021-04-10 MED ORDER — MIRTAZAPINE 15 MG PO TABS
45.0000 mg | ORAL_TABLET | Freq: Every day | ORAL | Status: DC
Start: 2021-04-10 — End: 2021-04-14
  Administered 2021-04-10 – 2021-04-13 (×4): 45 mg via ORAL
  Filled 2021-04-10 (×4): qty 3

## 2021-04-10 MED ORDER — METOPROLOL TARTRATE 50 MG PO TABS
100.0000 mg | ORAL_TABLET | Freq: Two times a day (BID) | ORAL | Status: DC
  Administered 2021-04-10 (×2): 100 mg via ORAL
  Filled 2021-04-10 (×3): qty 2

## 2021-04-10 MED ORDER — POTASSIUM CHLORIDE CRYS ER 20 MEQ PO TBCR
60.0000 meq | EXTENDED_RELEASE_TABLET | Freq: Once | ORAL | Status: AC
Start: 2021-04-10 — End: 2021-04-10
  Administered 2021-04-10: 60 meq via ORAL
  Filled 2021-04-10: qty 3

## 2021-04-10 NOTE — ED Provider Notes (Addendum)
[facility] Provider Note    Event Date/Time   First MD Initiated Contact with Patient 04/10/21 [phone]     (approximate)   History   Toe Pain (/)   HPI  Jenna Dudley is a 86 y.o. female who reportedly dropped an orange juice bottle on her to 3 days ago and comes in today complaining of severe intractable toe pain.  Patient has a history of chronic CHF had a recent admission for sepsis from an intra-abdominal problem.  Patient had a lactic acid drawn in the waiting room which is come back at 6.9.  Her white blood count usually runs between 2 and 3 and today is over 10.  There is a relative increase in the PMNs.  Patient has no fever no nausea no vomiting no abdominal pain no cough no shortness of breath no chest pain no other complaint except for the toe.  She does not take metformin.  The below is cut from old clinic notes and other notes in the old medical record Past Medical History:  Diagnosis Date   Bilateral carpal tunnel syndrome 07/27/2014   Bipolar disorder ([place])    geropsych admission to [facility] in Dec 2018   Chronic diastolic CHF (congestive heart failure) ([place])    a. 03/2017 Echo: EF 55-60%, no rwma, Gr1 DD, mild MR; b. 06/2019 Echo: EF 55-60%, no rwma, mod LVH, GrII DD. Nl RV size/fxn. RVSP 54.56mmHg. Mildly dil LA.   Chronic low back pain 07/29/2014   Degenerative arthritis of lumbar spine 07/27/2014   Dementia ([place])    Depression    Esophageal spasm 07/29/2014   GERD (gastroesophageal reflux disease)    History of stress test    a. 04/2017 MV: low risk stress test.  EF 55-65%. Probable apical ant/apical, basal, and mid inflat attenuation artifact vs ischemia/scar.   Hyperlipemia 07/29/2014   Hypertension    Pneumonia    Spinal stenosis 07/29/2014  Outpatient Medications Marked as Taking for the 12/28/20 encounter (Office Visit) with [name], NP  Medication Sig Dispense Refill   acetaminophen-codeine (TYLENOL #3) 300-30 mg  tablet TAKE 1 TABLET BY MOUTH EVERY 8 HOURS AS NEEDED FOR PAIN 90 tablet 0   acidophilus-pectin, citrus 100 million cell-10 mg Cap Take by mouth once daily   cyanocobalamin (VITAMIN B12) 500 MCG tablet Take by mouth   ergocalciferol, vitamin D2, 1,250 mcg (50,000 unit) capsule Take by mouth   FUROsemide (LASIX) 20 MG tablet Take 2 tablets by mouth once daily   losartan (COZAAR) 25 MG tablet TAKE 1 TABLET BY MOUTH ONCE DAILY 30 tablet 11   magnesium oxide (MAG-OX) 400 mg (241.3 mg magnesium) tablet Take 1 tablet (400 mg total) by mouth once daily 30 tablet 1   metoprolol tartrate (LOPRESSOR) 100 MG tablet   mirtazapine (REMERON) 45 MG tablet Take 45 mg by mouth nightly   multivitamin tablet Take 1 tablet by mouth once daily   naloxone (NARCAN) 4 mg/actuation nasal spray Place 1 spray (4 mg total) into one nostril once as needed (if not breathing or overdose is suspected.) for up to 1 dose Give 2nd dose in 5-10 min if not responding or if sx return for up to 1 dose. 2 each 1   OLANZapine (ZYPREXA) 20 MG tablet   omeprazole (PRILOSEC) 20 MG DR capsule TAKE 1 CAPSULE BY MOUTH TWICE DAILY 180 capsule 1   polyethylene glycol (MIRALAX) powder Take by mouth   sennosides-docusate (SENOKOT-S) 8.6-50 mg tablet Take  by mouth   simvastatin (ZOCOR) 40 MG tablet TAKE 1 TABLET BY MOUTH AT BEDTIME 90 tablet 2   venlafaxine (EFFEXOR-XR) 150 MG XR capsule Take 150 mg by mouth once daily    Physical Exam   Triage Vital Signs: ED Triage Vitals  Enc Vitals Group     BP 04/10/21 0628 112/63     Pulse Rate 04/10/21 0628 99     Resp 04/10/21 0628 18     Temp 04/10/21 0628 98.3 F (36.8 C)     Temp src --      SpO2 04/10/21 0628 99 %     Weight 04/10/21 0625 107 lb (48.5 kg)     Height 04/10/21 0625 5' (1.524 m)     Head Circumference --      Peak Flow --      Pain Score 04/10/21 0625 10     Pain Loc --      Pain Edu? --      Excl. in [place]? --     Most recent vital signs: Vitals:   04/10/21 0835  04/10/21 0840  BP: (!) 98/56   Pulse: (!) 116 (!) 110  Resp: 17 11  Temp:    SpO2: 99% 97%     General: Awake, alert at baseline mental status complaining of severe pain in her toe. CV:  Good peripheral perfusion.  Heart regular rate and rhythm no audible murmurs Resp:  Normal effort.  No respiratory distress lungs are clear Abd:  No distention.  Soft and nontender Other:  Skin is normal except for the third toe on the right foot which has a black eschar looking area on the dorsal surface of the toe itself is contracted and reddish-gray with a red area on the top of the foot immediately next to the toe with a red streak going up into the ankle .   ED Results / Procedures / Treatments   Labs (all labs ordered are listed, but only abnormal results are displayed) Labs Reviewed  CBC WITH DIFFERENTIAL/PLATELET - Abnormal; Notable for the following components:      Result Value   WBC 10.9 (*)    RBC 2.67 (*)    Hemoglobin 10.0 (*)    HCT 30.8 (*)    MCV 115.4 (*)    MCH 37.5 (*)    Neutro Abs 8.8 (*)    All other components within normal limits  COMPREHENSIVE METABOLIC PANEL - Abnormal; Notable for the following components:   Potassium 3.0 (*)    Chloride 94 (*)    Glucose, Bld 126 (*)    Creatinine, Ser 1.02 (*)    GFR, Estimated 55 (*)    Anion gap 17 (*)    All other components within normal limits  LACTIC ACID, PLASMA - Abnormal; Notable for the following components:   Lactic Acid, Venous 6.9 (*)    All other components within normal limits  LACTIC ACID, PLASMA - Abnormal; Notable for the following components:   Lactic Acid, Venous 3.2 (*)    All other components within normal limits  SEDIMENTATION RATE - Abnormal; Notable for the following components:   Sed Rate 109 (*)    All other components within normal limits  BLOOD GAS, VENOUS - Abnormal; Notable for the following components:   pH, Ven 7.53 (*)    pCO2, Ven 36 (*)    pO2, Ven 46.0 (*)    Bicarbonate 30.1 (*)     Acid-Base Excess 7.0 (*)  All other components within normal limits  CULTURE, BLOOD (SINGLE)  RESP PANEL BY RT-PCR (FLU A&B, COVID) ARPGX2  PROTIME-INR  APTT  PROCALCITONIN  CBC WITH DIFFERENTIAL/PLATELET  URINALYSIS, COMPLETE (UACMP) WITH MICROSCOPIC  URINALYSIS, ROUTINE W REFLEX MICROSCOPIC  MAGNESIUM  BRAIN NATRIURETIC PEPTIDE  C-REACTIVE PROTEIN  LACTIC ACID, PLASMA  LACTIC ACID, PLASMA     EKG     RADIOLOGY X-ray of the foot read by radiology reviewed very carefully by me does not show any fracture or gas or any other problems except for a lot of osteoarthritic change in the first M TP joint   PROCEDURES:  Critical Care performed:   Procedures   MEDICATIONS ORDERED IN ED: Medications  vancomycin (VANCOCIN) IVPB 1000 mg/200 mL premix (has no administration in time range)  fentaNYL (SUBLIMAZE) injection 12.5 mcg (0 mcg Intravenous Hold 04/10/21 0911)  potassium chloride SA (KLOR-CON M) CR tablet 60 mEq (has no administration in time range)  oxyCODONE-acetaminophen (PERCOCET/ROXICET) 5-325 MG per tablet 1 tablet (has no administration in time range)  acetaminophen (TYLENOL) tablet 650 mg (has no administration in time range)  lactated ringers infusion (has no administration in time range)  ondansetron (ZOFRAN) injection 4 mg (has no administration in time range)  hydrALAZINE (APRESOLINE) injection 5 mg (has no administration in time range)  cefTRIAXone (ROCEPHIN) 1 g in sodium chloride 0.9 % 100 mL IVPB (has no administration in time range)  heparin injection 5,000 Units (has no administration in time range)  fentaNYL (SUBLIMAZE) injection 12.5 mcg (12.5 mcg Intravenous Given 04/10/21 0804)  ondansetron (ZOFRAN) injection 4 mg (4 mg Intravenous Given 04/10/21 0804)  lactated ringers bolus 250 mL (250 mLs Intravenous New Bag/Given 04/10/21 0804)  ceFEPIme (MAXIPIME) 1 g in sodium chloride 0.9 % 100 mL IVPB (1 g Intravenous New Bag/Given 04/10/21 0837)  lactated  ringers bolus 250 mL (250 mLs Intravenous New Bag/Given 04/10/21 0818)     IMPRESSION / MDM / ASSESSMENT AND PLAN / ED COURSE  I reviewed the triage vital signs and the nursing notes.                             ----------------------------------------- 8:01 AM on 04/10/2021 -----------------------------------------  Patient with a very elevated lactic acid.  She does not have any other lactic acids that I can find in the old records.  There is nothing in her old medical history to make me think that she had a elevation of her lactic acid.  There is no history of fever.  There is no history of seizures either.  Her white count is elevated above her usual baseline.  There are a lot of PMNs on the differential.  This is all suspicious for sepsis.  She could have some gangrene developing in the toe.  This is the only apparent source.  I will get a chest x-ray just to make sure.  We will get some urine as well.  The patient is on the cardiac monitor to evaluate for evidence of arrhythmia and/or significant heart rate changes as well as signs of hypoxia.  Chest x-ray and foot x-ray read by radiology and reviewed in detail by me show no acute findings.  As I mentioned above there is no gas in the soft tissue no fractures.  Labs show a markedly elevated lactic acid which is come down somewhat with fluids.  Patient's white count is relatively high for her and her sed rate is also high.  She is alkalotic on the VBG.  She is complaining of severe pain in the toe.  I am wondering if she has a bad cellulitis or with that black spot on the toe possible developing gangrene even.  She was recently in the hospital for CHF so have to be very careful with giving her fluids.  Because of the lab findings are noted above I am giving her antibiotics for sepsis and I think we need to admit her at least observe her because she is old and frail with these marked lab abnormalities.  I will continue giving her fluids slowly to  see if we get the lactic acid level down even further.  I can find no other reason for her to have a lactic acid elevation.  She has no history of seizures no other signs of infection she is not taking metformin etc.  I should add that I have carefully reviewed her previous admission and many of her recent clinic notes.  I have also spoken with her husband who is in the room with her.       FINAL CLINICAL IMPRESSION(S) / ED DIAGNOSES   Final diagnoses:  Pain  Elevated lactic acid level  Elevated sedimentation rate  Widened pulse pressure  Cellulitis of toe of right foot     Rx / DC Orders   ED Discharge Orders     None        Note:  This document was prepared using Dragon voice recognition software and may include unintentional dictation errors.   Nena Polio, MD 04/10/21 [id number]    Nena Polio, MD 04/10/21 [phone]

## 2021-04-10 NOTE — H&P (Signed)
History and Physical    Jenna Dudley WLN:[id number] DOB: 1936/02/19 DOA: 04/10/2021  Referring MD/NP/PA:   PCP: Aderoju, Jadene Pierini, MD   Patient coming from:  The patient is coming from home.  At baseline, pt is independent for most of ADL.        Chief Complaint: right foot pain  HPI: Jenna Dudley is a 86 y.o. female with medical history significant of hypertension, hyperlipidemia, stroke, GERD, depression, spinal stenosis, chronic back pain, dementia, dCHF, bipolar disorder, partial small bowel obstruction, iron deficiency anemia, CKD-3, who presents with right foot pain.  Pt states that she dropped an orange juice bottle on her right foot 3 days ago. Pt has a wound on her toe. She developed pain and redness in her right 3rd toe.  The pain is constant, sharp, severe, nonradiating, and progressively worsening.  Patient does not have fever or chills.  Patient does not have chest pain, cough, shortness breath.  No nausea, vomiting, diarrhea or abdominal pain.  No symptoms of UTI.  ED Course: pt was found to have WBC 10.9, lactic acid 6.9, 3.2, INR 1.2, PTT 33, pending COVID-19 PCR, stable renal function, temperature normal, blood pressure 105/43, heart rate 99, RR 18, oxygen saturation 98% on room air.  Chest x-ray negative.  X-ray of right foot is negative for bony fracture.  Patient is placed on MedSurg bed for observation, Dr. Amalia Hailey of podiatry is consulted.  EKG:  Not done in ED, will get one.   Review of Systems:   General: no fevers, chills, no body weight gain, has fatigue HEENT: no blurry vision, hearing changes or sore throat Respiratory: no dyspnea, coughing, wheezing CV: no chest pain, no palpitations GI: no nausea, vomiting, abdominal pain, diarrhea, constipation GU: no dysuria, burning on urination, increased urinary frequency, hematuria  Ext: no leg edema Neuro: no unilateral weakness, numbness, or tingling, no vision change or hearing loss Skin: has pain and  redness in right 3rd toe MSK: No muscle spasm, no deformity, no limitation of range of movement in spin Heme: No easy bruising.  Travel history: No recent long distant travel.   Allergy:  Allergies  Allergen Reactions   Aspirin Other (See Comments)    Unknown reaction   Sulfa Antibiotics Other (See Comments)    Unknown reaction    Past Medical History:  Diagnosis Date   Bilateral carpal tunnel syndrome 07/27/2014   Bipolar disorder ([place])    geropsych admission to [facility] in Dec 2018   Chronic diastolic CHF (congestive heart failure) ([place])    a. 03/2017 Echo: EF 55-60%, no rwma, Gr1 DD, mild MR; b. 06/2019 Echo: EF 55-60%, no rwma, mod LVH, GrII DD. Nl RV size/fxn. RVSP 54.54mHg. Mildly dil LA.   Chronic low back pain 07/29/2014   Degenerative arthritis of lumbar spine 07/27/2014   Dementia ([place]    Depression    Esophageal spasm 07/29/2014   GERD (gastroesophageal reflux disease)    History of stress test    a. 04/2017 MV: low risk stress test.  EF 55-65%. Probable apical ant/apical, basal, and mid inflat attenuation artifact vs ischemia/scar.   Hyperlipemia 07/29/2014   Hypertension    Pneumonia    Spinal stenosis 07/29/2014    Past Surgical History:  Procedure Laterality Date   HIP SURGERY Right    INNER EAR SURGERY Right    REPLACEMENT TOTAL KNEE BILATERAL Bilateral 07/27/14    Social History:  reports that she quit smoking about 45 years ago.  Her smoking use included cigarettes. She has never used smokeless tobacco. She reports that she does not drink alcohol and does not use drugs.  Family History:  Family History  Problem Relation Age of Onset   Hypertension Mother    Stroke Mother      Prior to Admission medications   Medication Sig Start Date End Date Taking? Authorizing Provider  acetaminophen-codeine (TYLENOL #3) 300-30 MG per tablet Take 1 tablet by mouth every 8 (eight) hours as needed for moderate pain or severe pain.    [provider]   cholecalciferol (VITAMIN D) 1000 units tablet Take 1,000 Units by mouth daily.    [provider]  ferrous sulfate 325 (65 FE) MG EC tablet Take 325 mg by mouth daily with breakfast.    [provider]  furosemide (LASIX) 20 MG tablet Take 2 tablets (40 mg total) by mouth daily. 12/18/20   Pokhrel, Corrie Mckusick, MD  Lactobacillus Acid-Pectin (ACIDOPHILUS/PECTIN) CAPS Take by mouth.    [provider]  losartan (COZAAR) 25 MG tablet Take 1 tablet (25 mg total) by mouth daily. 09/26/14   Pucilowska, Jolanta B, MD  magnesium oxide (MAG-OX) 400 MG tablet Take 400 mg by mouth daily.    [provider]  metoprolol tartrate (LOPRESSOR) 100 MG tablet Take 1 tablet (100 mg total) by mouth 2 (two) times daily. 12/17/20 01/16/21  Pokhrel, Corrie Mckusick, MD  mirtazapine (REMERON) 45 MG tablet Take 1 tablet (45 mg total) by mouth at bedtime. 02/10/20   Clapacs, Madie Reno, MD  naloxone [facility]) nasal spray 4 mg/0.1 mL SMARTSIG:1 Spray(s) Both Nares Daily PRN 07/22/20   [provider]  OLANZapine (ZYPREXA) 20 MG tablet Take 1 tablet (20 mg total) by mouth at bedtime. 02/10/20   Clapacs, Madie Reno, MD  omeprazole (PRILOSEC) 20 MG capsule Take 20 mg by mouth 2 (two) times daily before a meal.     [provider]  polyethylene glycol (MIRALAX / GLYCOLAX) 17 g packet Take 17 g by mouth daily as needed for moderate constipation or mild constipation (if not helped with stool softner). 12/17/20   Pokhrel, Corrie Mckusick, MD  Probiotic Product (PROBIOTIC DAILY PO) Take by mouth daily.    [provider]  senna-docusate (SENOKOT-S) 8.6-50 MG tablet Take 1 tablet by mouth 2 (two) times daily. Stool softener 12/17/20 12/17/21  Pokhrel, Corrie Mckusick, MD  simvastatin (ZOCOR) 40 MG tablet Take 40 mg by mouth at bedtime.     [provider]  venlafaxine XR (EFFEXOR-XR) 150 MG 24 hr capsule 1 po daily 02/10/20   Clapacs, Madie Reno, MD  vitamin B-12 (CYANOCOBALAMIN) 500 MCG tablet Take 1 tablet (500  mcg total) by mouth daily. 12/17/20 12/17/21  Flora Lipps, MD    Physical Exam: Vitals:   04/10/21 0900 04/10/21 0930 04/10/21 1000 04/10/21 1044  BP: (!) 100/30 (!) 103/38 (!) 123/50 (!) 141/51  Pulse: 97 (!) 103 97 96  Resp:   18 12  Temp:    99.1 F (37.3 C)  SpO2: 96% 100% 100% 100%  Weight:      Height:       General: Not in acute distress HEENT:       Eyes: PERRL, EOMI, no scleral icterus.       ENT: No discharge from the ears and nose, no pharynx injection, no tonsillar enlargement.        Neck: No JVD, no bruit, no mass felt. Heme: No neck lymph node enlargement. Cardiac: S1/S2, RRR, No murmurs, No  gallops or rubs. Respiratory: No rales, wheezing, rhonchi or rubs. GI: Soft, nondistended, nontender, no rebound pain, no organomegaly, BS present. GU: No hematuria Ext: No pitting leg edema bilaterally. 1+DP/PT pulse bilaterally. Musculoskeletal: No joint deformities, No joint redness or warmth, no limitation of ROM in spin. Skin:  has a wound, black eschar, redness in right 3rd toe      Neuro: Alert, cranial nerves II-XII grossly intact, moves all extremities normally.  Psych: Patient is not psychotic, no suicidal or hemocidal ideation.  Labs on Admission: I have personally reviewed following labs and imaging studies  CBC: Recent Labs  Lab 04/10/21 0630  WBC 10.9*  NEUTROABS 8.8*  HGB 10.0*  HCT 30.8*  MCV 115.4*  PLT 782   Basic Metabolic Panel: Recent Labs  Lab 04/10/21 0630  NA 136  K 3.0*  CL 94*  CO2 25  GLUCOSE 126*  BUN 16  CREATININE 1.02*  CALCIUM 8.9  MG 1.6*   GFR: Estimated Creatinine Clearance: 30 mL/min (A) (by C-G formula based on SCr of 1.02 mg/dL (H)). Liver Function Tests: Recent Labs  Lab 04/10/21 0630  AST 27  ALT 9  ALKPHOS 53  BILITOT 0.9  PROT 7.1  ALBUMIN 3.7   No results for input(s): LIPASE, AMYLASE in the last 168 hours. No results for input(s): AMMONIA in the last 168 hours. Coagulation Profile: Recent  Labs  Lab 04/10/21 0725  INR 1.2   Cardiac Enzymes: No results for input(s): CKTOTAL, CKMB, CKMBINDEX, TROPONINI in the last 168 hours. BNP (last 3 results) No results for input(s): PROBNP in the last 8760 hours. HbA1C: No results for input(s): HGBA1C in the last 72 hours. CBG: No results for input(s): GLUCAP in the last 168 hours. Lipid Profile: No results for input(s): CHOL, HDL, LDLCALC, TRIG, CHOLHDL, LDLDIRECT in the last 72 hours. Thyroid Function Tests: No results for input(s): TSH, T4TOTAL, FREET4, T3FREE, THYROIDAB in the last 72 hours. Anemia Panel: No results for input(s): VITAMINB12, FOLATE, FERRITIN, TIBC, IRON, RETICCTPCT in the last 72 hours. Urine analysis:    Component Value Date/Time   COLORURINE YELLOW (A) 12/16/2020 1000   APPEARANCEUR CLOUDY (A) 12/16/2020 1000   APPEARANCEUR Cloudy 03/29/2012 2142   LABSPEC 1.015 12/16/2020 1000   LABSPEC 1.026 03/29/2012 2142   PHURINE 5.0 12/16/2020 1000   GLUCOSEU NEGATIVE 12/16/2020 1000   GLUCOSEU Negative 03/29/2012 2142   HGBUR NEGATIVE 12/16/2020 1000   BILIRUBINUR NEGATIVE 12/16/2020 1000   BILIRUBINUR Negative 03/29/2012 2142   KETONESUR NEGATIVE 12/16/2020 1000   PROTEINUR NEGATIVE 12/16/2020 1000   NITRITE NEGATIVE 12/16/2020 1000   LEUKOCYTESUR MODERATE (A) 12/16/2020 1000   LEUKOCYTESUR 3+ 03/29/2012 2142   Sepsis Labs: _0 (procalcitonin:4,lacticidven:4) ) Recent Results (from the past 240 hour(s))  Resp Panel by RT-PCR (Flu A&B, Covid) Nasopharyngeal Swab     Status: Abnormal   Collection Time: 04/10/21  9:27 AM   Specimen: Nasopharyngeal Swab; Nasopharyngeal(NP) swabs in vial transport medium  Result Value Ref Range Status   SARS Coronavirus 2 by RT PCR POSITIVE (A) NEGATIVE Final    Comment: (NOTE) SARS-CoV-2 target nucleic acids are DETECTED.  The SARS-CoV-2 RNA is generally detectable in upper respiratory specimens during the acute phase of infection. Positive results are indicative  of the presence of the identified virus, but do not rule out bacterial infection or co-infection with other pathogens not detected by the test. Clinical correlation with patient history and other diagnostic information is necessary to determine patient infection status. The expected result is Negative.  Fact Sheet for Patients: EntrepreneurPulse.com.au  Fact Sheet for Healthcare Providers: IncredibleEmployment.be  This test is not yet approved or cleared by the [place] FDA and  has been authorized for detection and/or diagnosis of SARS-CoV-2 by FDA under an Emergency Use Authorization (EUA).  This EUA will remain in effect (meaning this test can be used) for the duration of  the COVID-19 declaration under Section 564(b)(1) of the A ct, 21 U.S.C. section 360bbb-3(b)(1), unless the authorization is terminated or revoked sooner.     Influenza A by PCR NEGATIVE NEGATIVE Final   Influenza B by PCR NEGATIVE NEGATIVE Final    Comment: (NOTE) The Xpert Xpress SARS-CoV-2/FLU/RSV plus assay is intended as an aid in the diagnosis of influenza from Nasopharyngeal swab specimens and should not be used as a sole basis for treatment. Nasal washings and aspirates are unacceptable for Xpert Xpress SARS-CoV-2/FLU/RSV testing.  Fact Sheet for Patients: EntrepreneurPulse.com.au  Fact Sheet for Healthcare Providers: IncredibleEmployment.be  This test is not yet approved or cleared by the [place] FDA and has been authorized for detection and/or diagnosis of SARS-CoV-2 by FDA under an Emergency Use Authorization (EUA). This EUA will remain in effect (meaning this test can be used) for the duration of the COVID-19 declaration under Section 564(b)(1) of the Act, 21 U.S.C. section 360bbb-3(b)(1), unless the authorization is terminated or revoked.  Performed at [facility], [address]  [place], [place] [address]      Radiological Exams on Admission: DG Chest Portable 1 View  Result Date: 04/10/2021 CLINICAL DATA:  Sepsis, tremors, history of CHF EXAM: PORTABLE CHEST 1 VIEW COMPARISON:  12/13/2020 chest radiograph. FINDINGS: Stable cardiomediastinal silhouette with normal heart size. No pneumothorax. No pleural effusion. Lungs appear clear, with no acute consolidative airspace disease and no pulmonary edema. Surgical clips are seen in the right upper quadrant of the abdomen. IMPRESSION: No active disease. Electronically Signed   By: Ilona Sorrel M.D.   On: 04/10/2021 08:31   DG Foot Complete Right  Result Date: 04/10/2021 CLINICAL DATA:  Right third toe pain after trauma. Injury 5 days ago. EXAM: RIGHT FOOT COMPLETE - 3+ VIEW COMPARISON:  None. FINDINGS: Typical assessment of the digits due to hammertoe deformity and foreshortening of the digits on the frontal views. No visible acute fracture or dislocation. Severe first MTP osteoarthritis. Generalized osteopenia. Non eroded heel spur. IMPRESSION: 1. No acute finding as permitted by toe positioning. 2. Advanced first MTP osteoarthritis. Electronically Signed   By: Jorje Guild M.D.   On: 04/10/2021 07:02      Assessment/Plan Principal Problem:   Right foot infection Active Problems:   Essential hypertension   History of stroke   Depression   HLD (hyperlipidemia)   Bipolar 1 disorder (HCC)   Elevated lactic acid level   Hypokalemia   Chronic diastolic CHF (congestive heart failure) (HCC)   CKD (chronic kidney disease), stage IIIa   Right foot infection_3rd toe: Patient does not have fever.  Has mild leukocytosis, heart rate 99, but no tachypnea.  Does not meet criteria for sepsis, but the patient has significantly elevated lactic acid 6.9 --> 3.2.  Dr. Amalia Hailey of podiatry is consulted  - Placed on MedSurg bed for observation - Empiric antimicrobial treatment with Rocephin (patient received 1 dose of vancomycin and  cefepime in ED) - PRN Zofran for nausea, Tylenol Percocet for pain - Blood cultures x 2  - ESR and CRP - wound care consult - IVF: 250 cc x 2 of  LR bolus in ED, followed by 75 cc/h  Essential hypertension: Blood pressure is soft -IV hydralazine as needed -Metoprolol -Hold Lasix and Cozaar due to softer blood pressure  History of stroke -Zocor  Depression and bipolar -Continue Remeron and olanzapine -Also on Effexor  HLD (hyperlipidemia) -Zocor  Elevated lactic acid level: Lactic acid 6.9 --> 3.2.  Patient does not meet critical for sepsis, at least partially due to dehydration -Hold Lasix -IV fluid as above -Trend lactic acid level  Hypokalemia: Potassium 3.0 -Repleted potassium -Check magnesium level  Chronic diastolic CHF (congestive heart failure) ([place]): 2D echo 12/12/2010 showed EF 60 to 65% with grade 3 diastolic dysfunction.  Patient does not have leg edema.  No shortness of breath.  No pulm edema on chest x-ray, CHF seem to be compensated. -Hold Lasix due to elevated lactic acid level -Check BNP  CKD (chronic kidney disease), stage IIIa: Stable. -Follow-up with BMP         DVT ppx: SQ Heparin    Code Status: Full code Family Communication: Yes, patient's husband    at bed side. Disposition Plan:  Anticipate discharge back to previous environment Consults called:  Dr. Hilaria Ota of podiatry Admission status and Level of care: Telemetry Medical: for obs   Status is: Observation  The patient remains OBS appropriate and will d/c before 2 midnights.          Date of Service 04/10/2021    [facility]   If 7PM-7AM, please contact night-coverage [email or web address] 04/10/2021, 4:57 PM

## 2021-04-10 NOTE — ED Notes (Signed)
X-ray at bedside

## 2021-04-10 NOTE — Consult Note (Signed)
PODIATRY CONSULTATION  NAME Jenna Dudley MRN [id number] DOB 1935-10-25 DOA 04/10/2021   Reason for consult: Ulcer RT 3rd digit Chief Complaint  Patient presents with   Toe Pain         Consulting physician: Dr. Ivor Costa MD  History of present illness: 86 y.o. female PmHx HTN, hyperlipidemia, stroke, dimentia, CKDIII presenting for ulcer to the right 3rd toe. Patient sleeping and hard to wake during evaluation. Unable to obtain a history of the ulcer to the right third toe. Patient seems to be resting comfortably and not in any significant pain. X-ray taken upon presentation to ED negative for fracture or concern for osteomyelitis.   Past Medical History:  Diagnosis Date   Bilateral carpal tunnel syndrome 07/27/2014   Bipolar disorder ([place])    geropsych admission to [facility] in Dec 2018   Chronic diastolic CHF (congestive heart failure) ([place])    a. 03/2017 Echo: EF 55-60%, no rwma, Gr1 DD, mild MR; b. 06/2019 Echo: EF 55-60%, no rwma, mod LVH, GrII DD. Nl RV size/fxn. RVSP 54.56mmHg. Mildly dil LA.   Chronic low back pain 07/29/2014   Degenerative arthritis of lumbar spine 07/27/2014   Dementia ([place])    Depression    Esophageal spasm 07/29/2014   GERD (gastroesophageal reflux disease)    History of stress test    a. 04/2017 MV: low risk stress test.  EF 55-65%. Probable apical ant/apical, basal, and mid inflat attenuation artifact vs ischemia/scar.   Hyperlipemia 07/29/2014   Hypertension    Pneumonia    Spinal stenosis 07/29/2014    CBC Latest Ref Rng & Units 04/10/2021 12/17/2020 12/16/2020  WBC 4.0 - 10.5 K/uL 10.9(H) 2.9(L) 3.1(L)  Hemoglobin 12.0 - 15.0 g/dL 10.0(L) 10.2(L) 10.4(L)  Hematocrit 36.0 - 46.0 % 30.8(L) 30.2(L) 30.2(L)  Platelets 150 - 400 K/uL 301 123(L) 134(L)    BMP Latest Ref Rng & Units 04/10/2021 02/07/2021 01/28/2021  Glucose 70 - 99 mg/dL 126(H) 77 106(H)  BUN 8 - 23 mg/dL 16 21 30(H)  Creatinine 0.44 - 1.00 mg/dL 1.02(H) 1.19(H) 1.08(H)   BUN/Creat Ratio 12 - 28 - 18 28  Sodium 135 - 145 mmol/L 136 140 138  Potassium 3.5 - 5.1 mmol/L 3.0(L) 4.5 5.3(H)  Chloride 98 - 111 mmol/L 94(L) 100 103  CO2 22 - 32 mmol/L 25 25 20   Calcium 8.9 - 10.3 mg/dL 8.9 9.1 9.3       Physical Exam: General: The patient is alert and oriented x3 in no acute distress.   Dermatology: Ulcer noted to the right third toe appears very stable. No drainage. No malodor. Mild erythema to the toe. Well adhered somewhat superficial appearing eschar localized to the dorsal aspect of the toe.   Vascular: No history of PVD. Pulses palpable. No edema. Mild erythema localized to the third toe  Neurological: Epicritic and protective threshold grossly intact bilaterally.   Musculoskeletal Exam: Flexion contracture hammertoes to all digits.    ASSESSMENT/PLAN OF CARE Ulcer RT third toe - wound appears very stable. No drainage. No malodor. Can be managed outpatient with routine wound care and debridement. No plan to take patient to OR for debridement.  - recommend betadine daily to the toe.  - continue abx regimen as per admitting hospitalist. From a podiatry standpoint patient may DC of 7 days oral abx and f/u in office one week post discharge.  - podiatry to sign off.     Thank you for the consult.     Ruby Cola  Carver Fila, DPM [facility]  Dr. Edrick Kins, DPM    [address], [place] [address]                Office [phone]  Fax [phone]

## 2021-04-10 NOTE — Consult Note (Addendum)
WOC Nurse Consult Note: Reason for Consult: Right foot, third toe (hammer-toe deformity with presence of bunion) with area of nonviable tissue (eschar) at bony protuberance. Trauma reported (dropped a bottle of orange juice on it 3 days ago).  WOC Nurse is consulted simultaneously with Podiatric Medicine.  We will defer to them and change in the POC provided this pm. Consult is performed remotely after review of the patient's EHR including photographs of the lesion. Wound type: full thickness, trauma Pressure Injury POA: N/A Measurement:Not measured today Wound SJG:GEZMOQ/HUTML area of nonviable tissue with epidermal peeling Drainage (amount, consistency, odor) None Periwound:erythema, no edema Dressing procedure/placement/frequency: I will provide Nursing with guidance to pain the affected area with a betadine swabstick twice daily until Podiatry sees. Heels should be floated to prevent pressure injury.  WOC nursing team will not follow, but will remain available to this patient, the nursing and medical teams.  Please re-consult if needed. Thanks, Ladona Mow, MSN, RN, GNP, [place]  Pager# [phone]

## 2021-04-10 NOTE — ED Triage Notes (Addendum)
Pt c/o right 3rd toe pain after dropping a jug of orange juice on her foot x 5 days ago. Pt has a wound on her toe with redness that stops at the base of her toe.

## 2021-04-10 NOTE — ED Notes (Signed)
RN attempting 2nd IV access x2 without success. Will administer vancomycin when prior anx is finished

## 2021-04-10 NOTE — ED Notes (Signed)
RN at bedside to administer 2nd dose of fentanyl. Pt sleeping and oxygen saturations reading 88%. Pt placed on 2L [place]. RN will hold fentanyl at this time. MD aware.

## 2021-04-11 DIAGNOSIS — L03115 Cellulitis of right lower limb: Secondary | ICD-10-CM | POA: Diagnosis present

## 2021-04-11 DIAGNOSIS — I130 Hypertensive heart and chronic kidney disease with heart failure and stage 1 through stage 4 chronic kidney disease, or unspecified chronic kidney disease: Secondary | ICD-10-CM | POA: Diagnosis present

## 2021-04-11 DIAGNOSIS — R700 Elevated erythrocyte sedimentation rate: Secondary | ICD-10-CM | POA: Diagnosis present

## 2021-04-11 DIAGNOSIS — U071 COVID-19: Secondary | ICD-10-CM | POA: Diagnosis present

## 2021-04-11 DIAGNOSIS — K219 Gastro-esophageal reflux disease without esophagitis: Secondary | ICD-10-CM | POA: Diagnosis present

## 2021-04-11 DIAGNOSIS — E860 Dehydration: Secondary | ICD-10-CM | POA: Diagnosis present

## 2021-04-11 DIAGNOSIS — L089 Local infection of the skin and subcutaneous tissue, unspecified: Secondary | ICD-10-CM | POA: Diagnosis not present

## 2021-04-11 DIAGNOSIS — L97519 Non-pressure chronic ulcer of other part of right foot with unspecified severity: Secondary | ICD-10-CM | POA: Diagnosis present

## 2021-04-11 DIAGNOSIS — F03918 Unspecified dementia, unspecified severity, with other behavioral disturbance: Secondary | ICD-10-CM | POA: Diagnosis present

## 2021-04-11 DIAGNOSIS — E872 Acidosis, unspecified: Secondary | ICD-10-CM | POA: Diagnosis present

## 2021-04-11 DIAGNOSIS — N183 Chronic kidney disease, stage 3 unspecified: Secondary | ICD-10-CM | POA: Diagnosis present

## 2021-04-11 DIAGNOSIS — Z8673 Personal history of transient ischemic attack (TIA), and cerebral infarction without residual deficits: Secondary | ICD-10-CM | POA: Diagnosis not present

## 2021-04-11 DIAGNOSIS — Z8249 Family history of ischemic heart disease and other diseases of the circulatory system: Secondary | ICD-10-CM | POA: Diagnosis not present

## 2021-04-11 DIAGNOSIS — M2041 Other hammer toe(s) (acquired), right foot: Secondary | ICD-10-CM | POA: Diagnosis present

## 2021-04-11 DIAGNOSIS — F319 Bipolar disorder, unspecified: Secondary | ICD-10-CM | POA: Diagnosis present

## 2021-04-11 DIAGNOSIS — Z87891 Personal history of nicotine dependence: Secondary | ICD-10-CM | POA: Diagnosis not present

## 2021-04-11 DIAGNOSIS — Z882 Allergy status to sulfonamides status: Secondary | ICD-10-CM | POA: Diagnosis not present

## 2021-04-11 DIAGNOSIS — G4710 Hypersomnia, unspecified: Secondary | ICD-10-CM | POA: Diagnosis present

## 2021-04-11 DIAGNOSIS — F0393 Unspecified dementia, unspecified severity, with mood disturbance: Secondary | ICD-10-CM | POA: Diagnosis present

## 2021-04-11 DIAGNOSIS — Z96653 Presence of artificial knee joint, bilateral: Secondary | ICD-10-CM | POA: Diagnosis present

## 2021-04-11 DIAGNOSIS — Z886 Allergy status to analgesic agent status: Secondary | ICD-10-CM | POA: Diagnosis not present

## 2021-04-11 DIAGNOSIS — I5032 Chronic diastolic (congestive) heart failure: Secondary | ICD-10-CM | POA: Diagnosis present

## 2021-04-11 DIAGNOSIS — E785 Hyperlipidemia, unspecified: Secondary | ICD-10-CM | POA: Diagnosis present

## 2021-04-11 DIAGNOSIS — E876 Hypokalemia: Secondary | ICD-10-CM | POA: Diagnosis present

## 2021-04-11 DIAGNOSIS — N179 Acute kidney failure, unspecified: Secondary | ICD-10-CM | POA: Diagnosis present

## 2021-04-11 DIAGNOSIS — Z79899 Other long term (current) drug therapy: Secondary | ICD-10-CM | POA: Diagnosis not present

## 2021-04-11 LAB — BASIC METABOLIC PANEL
Anion gap: 14 (ref 5–15)
BUN: 22 mg/dL (ref 8–23)
CO2: 23 mmol/L (ref 22–32)
Calcium: 8.7 mg/dL — ABNORMAL LOW (ref 8.9–10.3)
Chloride: 99 mmol/L (ref 98–111)
Creatinine, Ser: 1.5 mg/dL — ABNORMAL HIGH (ref 0.44–1.00)
GFR, Estimated: 34 mL/min — ABNORMAL LOW (ref >60)
Glucose, Bld: 88 mg/dL (ref 70–99)
Potassium: 4.2 mmol/L (ref 3.5–5.1)
Sodium: 136 mmol/L (ref 135–145)

## 2021-04-11 LAB — CBC
HCT: 26.5 % — ABNORMAL LOW (ref 36.0–46.0)
Hemoglobin: 8.8 g/dL — ABNORMAL LOW (ref 12.0–15.0)
MCH: 37.1 pg — ABNORMAL HIGH (ref 26.0–34.0)
MCHC: 33.2 g/dL (ref 30.0–36.0)
MCV: 111.8 fL — ABNORMAL HIGH (ref 80.0–100.0)
Platelets: 272 10*3/uL (ref 150–400)
RBC: 2.37 MIL/uL — ABNORMAL LOW (ref 3.87–5.11)
RDW: 13.8 % (ref 11.5–15.5)
WBC: 10.1 10*3/uL (ref 4.0–10.5)
nRBC: 0 % (ref 0.0–0.2)

## 2021-04-11 LAB — MAGNESIUM: Magnesium: 1.8 mg/dL (ref 1.7–2.4)

## 2021-04-11 LAB — PROCALCITONIN: Procalcitonin: 0.1 ng/mL

## 2021-04-11 MED ORDER — SODIUM CHLORIDE 0.9 % IV SOLN
100.0000 mg | Freq: Every day | INTRAVENOUS | Status: DC
  Administered 2021-04-12 – 2021-04-14 (×3): 100 mg via INTRAVENOUS
  Filled 2021-04-11: qty 20
  Filled 2021-04-11: qty 100
  Filled 2021-04-11: qty 20

## 2021-04-11 MED ORDER — METOPROLOL TARTRATE 25 MG PO TABS
25.0000 mg | ORAL_TABLET | Freq: Two times a day (BID) | ORAL | Status: DC
  Administered 2021-04-11 – 2021-04-14 (×5): 25 mg via ORAL
  Filled 2021-04-11 (×6): qty 1

## 2021-04-11 MED ORDER — REMDESIVIR 100 MG IV SOLR
200.0000 mg | Freq: Once | INTRAVENOUS | Status: AC
  Administered 2021-04-11: 200 mg via INTRAVENOUS
  Filled 2021-04-11: qty 200

## 2021-04-11 NOTE — Assessment & Plan Note (Signed)
Continue statin. 

## 2021-04-11 NOTE — Plan of Care (Signed)

## 2021-04-11 NOTE — Assessment & Plan Note (Addendum)
Blood pressures been soft since admission, labile.  -- Reduced home metoprolol from 100 mg down to 25 mg twice daily -- Lasix held while getting IV fluids, now off fluids, resume Lasix when able -- Continue holding losartan

## 2021-04-11 NOTE — Assessment & Plan Note (Addendum)
Right third toe wound with eschar, no surrounding erythema warmth or swelling, no foul drainage.  Patient does have hammertoe deformities. -- Podiatry consulted on admission Recommendations: No current need for debridement Apply Betadine daily Continue antibiotics, may complete 7 days oral antibiotics and follow-up in office 1 week after discharge --Treated with Rocephin --Transition to [facility] tomorrow, 4 more days to complete 7. --Continue wound care and monitoring

## 2021-04-11 NOTE — Assessment & Plan Note (Addendum)
Present on admission.  Patient does not report any respiratory or GI symptoms.  Chest x-ray negative. She has been placed on 2 L nasal cannula oxygen however O2 sats are in the upper 90s to 100%.  Low-grade temperature this morning 100.1.  Patient seems quite lethargic on my encounter. -- Continue remdesivir -- Initially steroid deferred due to lack of hypoxia. Pt now on oxygen. -- Start IV steroids -- Supportive care --monitor inflammatory markers

## 2021-04-11 NOTE — Assessment & Plan Note (Addendum)
Resolved.  Presented with lactic acid of 6.9.  Did not meet SIRS criteria, not septic shock.  Hemodynamically stable. Lactate improved with IV fluids, normal today at 1.6 -- Stop IV fluids

## 2021-04-11 NOTE — Consult Note (Signed)
Remdesivir - Pharmacy Brief Note   O:  ALT: ordered CXR: no active disease SpO2: 95% on 2 L Jenna Dudley   A/P:  Remdesivir 200 mg IVPB once followed by 100 mg IVPB daily x 4 days.   Sharen Hones, PharmD, BCPS Clinical Pharmacist   04/11/2021 7:55 PM

## 2021-04-11 NOTE — Progress Notes (Signed)
Progress Note   Patient: Jenna Dudley [id number] DOB: 03/29/1935 DOA: 04/10/2021     0 DOS: the patient was seen and examined on 04/11/2021   Brief hospital course: 86 y.o. female with medical history significant of hypertension, hyperlipidemia, stroke, GERD, depression, spinal stenosis, chronic back pain, dementia, dCHF, bipolar disorder, partial small bowel obstruction, iron deficiency anemia, CKD-3, who presented to ED on 04/10/2021 with right foot pain.  She had dropped an orange juice bottle on her right foot 3 days prior, sustaining a wound on the 3rd toe.    Labs showed significant lactic acidosis 6.9>>3.2 after fluids, WBC 10.9k.  Covid-19 PCR positive.  BP soft 105/43, no hypoxia.  Chest xray negative.  Right foot xray with no fracture or signs of osteomyelitis.  Podiatry was consulted and patient admitted to medicine service.    Assessment and Plan * Right foot infection- (present on admission) Right third toe wound with eschar, no surrounding erythema warmth or swelling, no foul drainage.  Patient does have hammertoe deformities. -- Podiatry consulted on admission Recommendations: No current need for debridement Apply Betadine daily Continue antibiotics, may complete 7 days oral antibiotics and follow-up in office 1 week after discharge --Continue current antibiotic --Continue wound care and monitoring   Elevated lactic acid level- (present on admission) Presented with lactic acid of 6.9.  Did not meet SIRS criteria, not septic shock.  Hemodynamically stable. Lactate improved with IV fluids but still elevated.  Suspect due to dehydration. -- Continue IV fluids --- Repeat lactate level with morning labs  COVID-19 virus infection- (present on admission) Present on admission.  Patient does not report any respiratory or GI symptoms.  Chest x-ray negative. She has been placed on 2 L nasal cannula oxygen however O2 sats are in the upper 90s to 100%.  Low-grade temperature this  morning 100.1.  Patient seems quite lethargic on my encounter. -- Start remdesivir -- Defer steroid for now unless truly hypoxic with room air sat below 90% -- Supportive care --monitor inflammatory markers  Hypokalemia- (present on admission) Present on admission with K3.0. Potassium improved to 4.2 today -- Monitor and replace K as needed  Chronic diastolic CHF (congestive heart failure) ([place])- (present on admission) Patient appears euvolemic on exam, and compensated.  Echo in 2012 showed EF 60 to 123456, grade 3 diastolic dysfunction.  No pulmonary edema on chest x-ray.   BNP 1275.8, elevated -- Lasix held on admission due to lactic acidosis, getting IV fluids -- Reduce home metoprolol from 100 to 25 mg twice daily given soft BP -- Monitor volume status closely getting fluids -- Consider repeat echo  CKD (chronic kidney disease), stage IIIa- (present on admission) Renal function appears stable. -- Monitor BMP  Essential hypertension- (present on admission) Blood pressures been soft since admission. -- Reduce home metoprolol from 100 mg down to 25 mg twice daily -- Lasix held while getting IV fluids -- Continue holding losartan  HLD (hyperlipidemia)- (present on admission) Continue statin  History of stroke Continue statin  Depression- (present on admission) Continued on home Effexor, Remeron  Bipolar 1 disorder ([place])- (present on admission) Continue home olanzapine, Remeron and Effexor     Subjective: Patient was sleeping and difficult to wake up when seen on rounds this morning.  She would arouse for a few seconds at a time and quickly fall back to sleep.  She would answer questions appropriately however.  She answered no when asked if any pain, breathing trouble, cough or fevers.  Unable to  elicit any other complaints at this time  Objective Vitals reviewed and notable for 99 to 100% O2 sat on 2 L/min nasal cannula oxygen, mildly elevated temperature this morning  100.1 along with mild tachycardia 104 that was transient.  BP stable but still on the soft side.  General exam: Sleeping, slightly difficult to arouse, falls quickly back to sleep, no acute distress Respiratory system: CTAB, no wheezes, rales or rhonchi, normal respiratory effort. Cardiovascular system: normal [id number], RRR, systolic murmur, no pedal edema.   Gastrointestinal system: soft, nontender abdomen, bowel sounds present Central nervous system: no gross focal neurologic deficits, normal speech but limited due to somnolence Extremities: Hammertoe deformities noted, right third toe with wound on the anterior superior aspect with eschar no surrounding erythema or swelling, no drainage from the wound Skin: dry, intact, normal temperature, pale Psychiatry: Unable to assess at this time due to somnolence    Data Reviewed:  Labs reviewed and notable for creatinine 1.5, calcium 8.7, most recent lactic acid 4.0, procalcitonin 0.10, magnesium 1.8, hemoglobin 8.8 (down from 10.9) , CBC appears consistent with hemodilution.  COVID-19 PCR positive influenza antigens negative  Family Communication: None at bedside, will attempt to call  Disposition: Status is: Inpatient  Remains inpatient appropriate because: Severity of illness remaining on IV therapies as outlined above         Time spent: 40 minutes  Author: Ezekiel Slocumb, DO 04/11/2021 7:50 PM  For on call review [email or web address].

## 2021-04-11 NOTE — Assessment & Plan Note (Addendum)
Present on admission with K3.0. Potassium improved to 3.7 today -- Monitor and replace K as needed

## 2021-04-11 NOTE — Hospital Course (Signed)
86 y.o. female with medical history significant of hypertension, hyperlipidemia, stroke, GERD, depression, spinal stenosis, chronic back pain, dementia, dCHF, bipolar disorder, partial small bowel obstruction, iron deficiency anemia, CKD-3, who presented to ED on 04/10/2021 with right foot pain.  She had dropped an orange juice bottle on her right foot 3 days prior, sustaining a wound on the 3rd toe.    Labs showed significant lactic acidosis 6.9>>3.2 after fluids, WBC 10.9k.  Covid-19 PCR positive.  BP soft 105/43, no hypoxia.  Chest xray negative.  Right foot xray with no fracture or signs of osteomyelitis.  Podiatry was consulted and patient admitted to medicine service.

## 2021-04-11 NOTE — TOC Progression Note (Signed)
Transition of Care [facility]) - Progression Note    Patient Details  Name: Jenna Dudley MRN: [id number] Date of Birth: December 09, 1935  [facility]) CM/SW Contact  Marlowe Sax, RN Phone Number: 04/11/2021, 3:35 PM  Clinical Narrative:   called the patient's husband [name] and left a general voice mail for a call back         Expected Discharge Plan and Services                                                 Social Determinants of Health (SDOH) Interventions    Readmission Risk Interventions No flowsheet data found.

## 2021-04-11 NOTE — Assessment & Plan Note (Addendum)
Patient appears euvolemic on exam, and compensated.  Echo in 2012 showed EF 60 to 123456, grade 3 diastolic dysfunction.  No pulmonary edema on chest x-ray.   BNP 1275.8, elevated -- Lasix held on admission due to lactic acidosis, getting IV fluids.  --Stopping fluids today, reassess and resume Lasix when able -- Reduced home metoprolol from 100 to 25 mg twice daily given soft BP -- Monitor volume status closely getting fluids -- Consider repeat echo

## 2021-04-11 NOTE — Assessment & Plan Note (Signed)
Continue home olanzapine, Remeron and Effexor

## 2021-04-11 NOTE — Assessment & Plan Note (Signed)
Continued on home Effexor, Remeron

## 2021-04-11 NOTE — Evaluation (Signed)
Physical Therapy Evaluation Patient Details Name: Jenna Dudley MRN: [id number] DOB: 10-16-1935 Today's Date: 04/11/2021  History of Present Illness  Jenna Dudley is a 86 y.o. female with medical history significant of hypertension, hyperlipidemia, stroke, GERD, depression, spinal stenosis, chronic back pain, dementia, dCHF, bipolar disorder, partial small bowel obstruction, iron deficiency anemia, CKD-3, who presents with right foot pain. No surgical intervention planned.   Clinical Impression  Patient sleeping upon arrival. She rouses to my voice. Answers a couple of questions. HOH. Patient is able to perform supine to sit with min assist. Sat briefly at edge of bed. They returned herself to supine despite cues to try to stand and not to lie back down. Patient states she wants to go home. She went back to sleep once back in bed. Patient will continue to benefit from skilled PT while here to improve functional mobility and independence for safe return home.         Recommendations for follow up therapy are one component of a multi-disciplinary discharge planning process, led by the attending physician.  Recommendations may be updated based on patient status, additional functional criteria and insurance authorization.  Follow Up Recommendations Skilled nursing-short term rehab (<3 hours/day)    Assistance Recommended at Discharge Intermittent Supervision/Assistance  Patient can return home with the following  A lot of help with bathing/dressing/bathroom;A lot of help with walking and/or transfers;Help with stairs or ramp for entrance;Assistance with cooking/housework    Equipment Recommendations Other (comment) (TBD)  Recommendations for Other Services       Functional Status Assessment Patient has had a recent decline in their functional status and demonstrates the ability to make significant improvements in function in a reasonable and predictable amount of time.     Precautions /  Restrictions Precautions Precautions: Fall Restrictions Weight Bearing Restrictions: No      Mobility  Bed Mobility Overal bed mobility: Needs Assistance Bed Mobility: Supine to Sit     Supine to sit: Min guard     General bed mobility comments: patient sat up on edge of bed briefly. Not following direction to stand and proceeded to lie back down despite cues not to.    Transfers                   General transfer comment: unable    Ambulation/Gait               General Gait Details: unable due to lethargy  Stairs            Wheelchair Mobility    Modified Rankin (Stroke Patients Only)       Balance Overall balance assessment: Needs assistance Sitting-balance support: No upper extremity supported;Feet unsupported Sitting balance-Leahy Scale: Fair                                       Pertinent Vitals/Pain Pain Assessment: No/denies pain    Home Living Family/patient expects to be discharged to:: Private residence Living Arrangements: Alone                 Additional Comments: Patient quite lethargic. Tells me she lives alone. Unable to answer home set up questions at this time.    Prior Function               Mobility Comments: unsure ADLs Comments: unsure     Hand Dominance  Extremity/Trunk Assessment                Communication   Communication: HOH  Cognition Arousal/Alertness: Lethargic Behavior During Therapy: WFL for tasks assessed/performed Overall Cognitive Status: Difficult to assess                                          General Comments      Exercises     Assessment/Plan    PT Assessment Patient needs continued PT services  PT Problem List Decreased strength;Decreased activity tolerance;Decreased mobility       PT Treatment Interventions DME instruction;Therapeutic activities;Gait training;Therapeutic exercise;Functional mobility  training;Patient/family education    PT Goals (Current goals can be found in the Care Plan section)  Acute Rehab PT Goals Patient Stated Goal: none stated PT Goal Formulation: Patient unable to participate in goal setting Time For Goal Achievement: 04/18/21    Frequency Min 2X/week     Co-evaluation               AM-PAC PT "6 Clicks" Mobility  Outcome Measure Help needed turning from your back to your side while in a flat bed without using bedrails?: A Little Help needed moving from lying on your back to sitting on the side of a flat bed without using bedrails?: A Little Help needed moving to and from a bed to a chair (including a wheelchair)?: Total Help needed standing up from a chair using your arms (e.g., wheelchair or bedside chair)?: Total Help needed to walk in hospital room?: Total Help needed climbing 3-5 steps with a railing? : Total 6 Click Score: 10    End of Session Equipment Utilized During Treatment: Oxygen Activity Tolerance: Patient limited by lethargy Patient left: in bed;with call bell/phone within reach;with bed alarm set Nurse Communication: Mobility status PT Visit Diagnosis: Other abnormalities of gait and mobility (R26.89)    Time: 1320-1330 PT Time Calculation (min) (ACUTE ONLY): 10 min   Charges:   PT Evaluation $PT Eval Low Complexity: 1 Low          Brailyn Killion, PT, GCS 04/11/21,1:39 PM

## 2021-04-11 NOTE — Assessment & Plan Note (Addendum)
AKI on admission.  Baseline Cr around 1, presented with Cr 1.50. Renal function improved with hydration Cr today 1.05. -- Monitor BMP

## 2021-04-12 DIAGNOSIS — L089 Local infection of the skin and subcutaneous tissue, unspecified: Secondary | ICD-10-CM | POA: Diagnosis not present

## 2021-04-12 LAB — LACTIC ACID, PLASMA: Lactic Acid, Venous: 1.6 mmol/L (ref 0.5–1.9)

## 2021-04-12 LAB — COMPREHENSIVE METABOLIC PANEL
ALT: 44 U/L (ref 0–44)
AST: 69 U/L — ABNORMAL HIGH (ref 15–41)
Albumin: 3.1 g/dL — ABNORMAL LOW (ref 3.5–5.0)
Alkaline Phosphatase: 47 U/L (ref 38–126)
Anion gap: 10 (ref 5–15)
BUN: 20 mg/dL (ref 8–23)
CO2: 27 mmol/L (ref 22–32)
Calcium: 8.4 mg/dL — ABNORMAL LOW (ref 8.9–10.3)
Chloride: 100 mmol/L (ref 98–111)
Creatinine, Ser: 1.05 mg/dL — ABNORMAL HIGH (ref 0.44–1.00)
GFR, Estimated: 53 mL/min — ABNORMAL LOW (ref >60)
Glucose, Bld: 103 mg/dL — ABNORMAL HIGH (ref 70–99)
Potassium: 3.7 mmol/L (ref 3.5–5.1)
Sodium: 137 mmol/L (ref 135–145)
Total Bilirubin: 0.7 mg/dL (ref 0.3–1.2)
Total Protein: 6.2 g/dL — ABNORMAL LOW (ref 6.5–8.1)

## 2021-04-12 LAB — GLUCOSE, CAPILLARY
Glucose-Capillary: 99 mg/dL (ref 70–99)
Glucose-Capillary: 99 mg/dL (ref 70–99)
Glucose-Capillary: 83 mg/dL (ref 70–99)
Glucose-Capillary: 129 mg/dL — ABNORMAL HIGH (ref 70–99)

## 2021-04-12 LAB — CBC WITH DIFFERENTIAL/PLATELET
Abs Immature Granulocytes: 0.01 10*3/uL (ref 0.00–0.07)
Basophils Absolute: 0.1 10*3/uL (ref 0.0–0.1)
Basophils Relative: 1 %
Eosinophils Absolute: 0 10*3/uL (ref 0.0–0.5)
Eosinophils Relative: 0 %
HCT: 24.7 % — ABNORMAL LOW (ref 36.0–46.0)
Hemoglobin: 8.1 g/dL — ABNORMAL LOW (ref 12.0–15.0)
Immature Granulocytes: 0 %
Lymphocytes Relative: 15 %
Lymphs Abs: 1.2 10*3/uL (ref 0.7–4.0)
MCH: 37.2 pg — ABNORMAL HIGH (ref 26.0–34.0)
MCHC: 32.8 g/dL (ref 30.0–36.0)
MCV: 113.3 fL — ABNORMAL HIGH (ref 80.0–100.0)
Monocytes Absolute: 0.6 10*3/uL (ref 0.1–1.0)
Monocytes Relative: 8 %
Neutro Abs: 5.9 10*3/uL (ref 1.7–7.7)
Neutrophils Relative %: 76 %
Platelets: 188 10*3/uL (ref 150–400)
RBC: 2.18 MIL/uL — ABNORMAL LOW (ref 3.87–5.11)
RDW: 13.7 % (ref 11.5–15.5)
WBC: 7.8 10*3/uL (ref 4.0–10.5)
nRBC: 0 % (ref 0.0–0.2)

## 2021-04-12 LAB — PROCALCITONIN: Procalcitonin: 0.11 ng/mL

## 2021-04-12 LAB — C-REACTIVE PROTEIN: CRP: 10.1 mg/dL — ABNORMAL HIGH (ref <1.0)

## 2021-04-12 MED ORDER — CEFDINIR 300 MG PO CAPS
300.0000 mg | ORAL_CAPSULE | Freq: Two times a day (BID) | ORAL | Status: DC
  Administered 2021-04-13 – 2021-04-14 (×3): 300 mg via ORAL
  Filled 2021-04-12 (×4): qty 1

## 2021-04-12 MED ORDER — DEXAMETHASONE SODIUM PHOSPHATE 10 MG/ML IJ SOLN
6.0000 mg | INTRAMUSCULAR | Status: DC
  Administered 2021-04-12 – 2021-04-13 (×2): 6 mg via INTRAVENOUS
  Filled 2021-04-12 (×3): qty 0.6

## 2021-04-12 NOTE — Progress Notes (Cosign Needed)
The above name patient will need Short Term Rehabilitation fr rehab and strengthening  in a Short Term Rehab facility, It is anticipated that she will be in the Short term Rehab facility less than 30 days. It is anticipated that she will return to her previous Home environment after Rehab.

## 2021-04-12 NOTE — Progress Notes (Signed)
PT Cancellation Note  Patient Details Name: Jenna Dudley MRN: [id number] DOB: 1935/08/03   Cancelled Treatment:    Reason Eval/Treat Not Completed: Other (comment). Therapy session attempted this afternoon, however pt very somnolent, doesn't awaken to verbal/tactile cues. Removed covers and assisted in positioning pillows between knees. Pt slightly arouses to positioning for a few seconds and then continues to sleep. Unable to perform session, will re-attempt next available date if pt more alert.   Knoah Nedeau 04/12/2021, 2:26 PM Greggory Stallion, PT, DPT, GCS [phone]

## 2021-04-12 NOTE — Progress Notes (Signed)
Patient resting in bed at this time.NAD noted.Pt very calm and cooperative during the shift,alert and verbal,c/o localized pain on R 3rd toe while treatment order was been done.R foot was washed with soap and water, cleaned and pat dry.wound on 3rd toe applied with betadine swabstick and allow to air dry.will cont to monitor.VS wnl.

## 2021-04-12 NOTE — Progress Notes (Signed)
Provider notified of patient 6 run of VTAC.

## 2021-04-12 NOTE — NC FL2 (Addendum)
[place] LEVEL OF CARE SCREENING TOOL     IDENTIFICATION  Patient Name: Jenna Dudley Birthdate: 1935-06-15 Sex: female Admission Date (Current Location): 04/10/2021  [facility] and [place] Number:  Engineering geologist and Address:  [facility], [address] [place], [place] [address]      Provider Number: [id number]  Attending Physician Name and Address:  Ezekiel Slocumb, DO  Relative Name and Phone Number:  Carloyn Manner husband [phone]    Current Level of Care: Hospital Recommended Level of Care: [place] Prior Approval Number:    Date Approved/Denied:   PASRR Number: Pending  Discharge Plan: SNF    Current Diagnoses: Patient Active Problem List   Diagnosis Date Noted   COVID-19 virus infection 04/11/2021   Right foot infection 04/10/2021   Elevated lactic acid level 04/10/2021   Hypokalemia 04/10/2021   Chronic diastolic CHF (congestive heart failure) ([place]) 04/10/2021   CKD (chronic kidney disease), stage IIIa 04/10/2021   Iron deficiency anemia 04/10/2021   CHF exacerbation ([place]) 12/10/2020   Dementia ([place])    HCAP (healthcare-associated pneumonia) 12/18/2017   Bipolar 1 disorder ([place]) 05/29/2017   CHF (congestive heart failure) ([place]) 04/23/2017   Accelerated hypertension 04/21/2017   Severe sepsis ([place]) 10/03/2015   Hypernatremia 10/03/2015   Lactic acidosis 10/03/2015   Leukocytosis 10/03/2015   Agitation 10/03/2015   Severe recurrent major depression without psychotic features ([place])    Major depressive disorder, recurrent episode, severe, with psychotic behavior ([place]) 10/13/2014   Severe episode of recurrent major depressive disorder, with psychotic features ([place]) 10/13/2014   Major depressive disorder, 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 features ([place]) 10/06/2014   Essential hypertension 09/17/2014   Chronic back pain 09/17/2014   Gastric reflux 09/17/2014   History of stroke 09/17/2014   Severe  recurrent major depression with psychotic features ([place])    Acute on chronic diastolic CHF (congestive heart failure) ([place]) 07/29/2014   Partial small bowel obstruction ([place]) 07/29/2014   Hallux abductovalgus with bunions 09/12/2013   Hammer toe 09/12/2013   Foot pain 09/12/2013   Fungal infection of nail 09/12/2013   Carpal tunnel syndrome 12/21/2010   Chronic low back pain 12/21/2010   Degenerative arthritis of lumbar spine 12/21/2010   Depression 12/21/2010   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   HLD (hyperlipidemia) 12/21/2010   Acid reflux 12/21/2010   Spinal stenosis 12/21/2010    Orientation RESPIRATION BLADDER Height & Weight     Self, Place, Situation  Normal, O2 (2 liters) External catheter Weight: 49.2 kg Height:  5' (152.4 cm)  BEHAVIORAL SYMPTOMS/MOOD NEUROLOGICAL BOWEL NUTRITION STATUS      Continent Diet (regular)  AMBULATORY STATUS COMMUNICATION OF NEEDS Skin   Extensive Assist Verbally Normal                       Personal Care Assistance Level of Assistance  Bathing, Dressing, Feeding Bathing Assistance: Maximum assistance Feeding assistance: Limited assistance Dressing Assistance: Maximum assistance     Functional Limitations Info  Sight, Hearing, Speech Sight Info: Adequate Hearing Info: Impaired Speech Info: Adequate    SPECIAL CARE FACTORS FREQUENCY  PT (By licensed PT), OT (By licensed OT)     PT Frequency: 5 times per week OT Frequency: 5 times per week            Contractures Contractures Info: Not present    Additional Factors Info  Code Status, Allergies Code Status Info: full code Allergies Info: Aspirin, Sulfa Antibiotics  Current Medications (04/12/2021):  This is the current hospital active medication list Current Facility-Administered Medications  Medication Dose Route Frequency Provider Last Rate Last Admin   acetaminophen (TYLENOL) tablet 650 mg  650 mg Oral Q6H PRN Ivor Costa, MD   650 mg at 04/12/21 0422    acidophilus (RISAQUAD) capsule 1 capsule  1 capsule Oral Daily Ivor Costa, MD   1 capsule at 04/12/21 0934   [START ON 04/13/2021] cefdinir (OMNICEF) capsule 300 mg  300 mg Oral Q12H Nicole Kindred A, DO       cholecalciferol (VITAMIN D3) tablet 1,000 Units  1,000 Units Oral Daily Ivor Costa, MD   1,000 Units at 04/12/21 0939   dexamethasone (DECADRON) injection 6 mg  6 mg Intravenous Q24H Nicole Kindred A, DO       heparin injection 5,000 Units  5,000 Units Subcutaneous Cleophas Dunker, MD   5,000 Units at 04/12/21 0547   hydrALAZINE (APRESOLINE) injection 5 mg  5 mg Intravenous Q2H PRN Ivor Costa, MD       LORazepam (ATIVAN) injection 1 mg  1 mg Intravenous Q6H PRN Ivor Costa, MD       magnesium oxide (MAG-OX) tablet 400 mg  400 mg Oral Daily Ivor Costa, MD   400 mg at 04/12/21 0934   metoprolol tartrate (LOPRESSOR) tablet 25 mg  25 mg Oral BID Nicole Kindred A, DO   25 mg at 04/12/21 0935   mirtazapine (REMERON) tablet 45 mg  45 mg Oral QHS Ivor Costa, MD   45 mg at 04/11/21 2129   OLANZapine (ZYPREXA) tablet 20 mg  20 mg Oral QHS Ivor Costa, MD   20 mg at 04/11/21 2128   ondansetron (ZOFRAN) injection 4 mg  4 mg Intravenous Q8H PRN Ivor Costa, MD       oxyCODONE-acetaminophen (PERCOCET/ROXICET) 5-325 MG per tablet 1 tablet  1 tablet Oral Q4H PRN Ivor Costa, MD   1 tablet at 04/10/21 1535   pantoprazole (PROTONIX) EC tablet 40 mg  40 mg Oral Daily Ivor Costa, MD   40 mg at 04/12/21 [id number]   polyethylene glycol (MIRALAX / GLYCOLAX) packet 17 g  17 g Oral Daily PRN Ivor Costa, MD       remdesivir 100 mg in sodium chloride 0.9 % 100 mL IVPB  100 mg Intravenous Daily Dorothe Pea, RPH 200 mL/hr at 04/12/21 1116 100 mg at 04/12/21 1116   senna-docusate (Senokot-S) tablet 1 tablet  1 tablet Oral BID Ivor Costa, MD   1 tablet at 04/12/21 [id number]   simvastatin (ZOCOR) tablet 40 mg  40 mg Oral QHS Ivor Costa, MD   40 mg at 04/11/21 2129   venlafaxine XR (EFFEXOR-XR) 24 hr capsule 150 mg  150 mg Oral Q  breakfast Ivor Costa, MD   150 mg at 04/12/21 [id number]   vitamin B-12 (CYANOCOBALAMIN) tablet 500 mcg  500 mcg Oral Daily Ivor Costa, MD   500 mcg at 04/12/21 [id number]   Facility-Administered Medications Ordered in Other Encounters  Medication Dose Route Frequency Provider Last Rate Last Admin   0.9 %  sodium chloride infusion    Continuous PRN Dionne Bucy, CRNA   Continued from Pre-op at 08/07/18 1110   lactated ringers infusion   Intravenous Continuous Clapacs, Madie Reno, MD   New Bag at 07/09/19 1014     Discharge Medications: Please see discharge summary for a list of discharge medications.  Relevant Imaging Results:  Relevant Lab Results:   Additional Information SS# [id number]  Conception Oms, RN

## 2021-04-12 NOTE — Progress Notes (Signed)
Progress Note   Patient: Jenna Dudley [id number] DOB: 29-May-1935 DOA: 04/10/2021     1 DOS: the patient was seen and examined on 04/12/2021   Brief hospital course: 86 y.o. female with medical history significant of hypertension, hyperlipidemia, stroke, GERD, depression, spinal stenosis, chronic back pain, dementia, dCHF, bipolar disorder, partial small bowel obstruction, iron deficiency anemia, CKD-3, who presented to ED on 04/10/2021 with right foot pain.  She had dropped an orange juice bottle on her right foot 3 days prior, sustaining a wound on the 3rd toe.    Labs showed significant lactic acidosis 6.9>>3.2 after fluids, WBC 10.9k.  Covid-19 PCR positive.  BP soft 105/43, no hypoxia.  Chest xray negative.  Right foot xray with no fracture or signs of osteomyelitis.  Podiatry was consulted and patient admitted to medicine service.    Assessment and Plan * Right foot infection- (present on admission) Right third toe wound with eschar, no surrounding erythema warmth or swelling, no foul drainage.  Patient does have hammertoe deformities. -- Podiatry consulted on admission Recommendations: No current need for debridement Apply Betadine daily Continue antibiotics, may complete 7 days oral antibiotics and follow-up in office 1 week after discharge --Treated with Rocephin --Transition to [facility] tomorrow, 4 more days to complete 7. --Continue wound care and monitoring   Elevated lactic acid level- (present on admission) Resolved.  Presented with lactic acid of 6.9.  Did not meet SIRS criteria, not septic shock.  Hemodynamically stable. Lactate improved with IV fluids, normal today at 1.6 -- Stop IV fluids  COVID-19 virus infection- (present on admission) Present on admission.  Patient does not report any respiratory or GI symptoms.  Chest x-ray negative. She has been placed on 2 L nasal cannula oxygen however O2 sats are in the upper 90s to 100%.  Low-grade temperature this morning  100.1.  Patient seems quite lethargic on my encounter. -- Continue remdesivir -- Initially steroid deferred due to lack of hypoxia. Pt now on oxygen. -- Start IV steroids -- Supportive care --monitor inflammatory markers  Hypokalemia- (present on admission) Present on admission with K3.0. Potassium improved to 3.7 today -- Monitor and replace K as needed  Chronic diastolic CHF (congestive heart failure) (HCC)- (present on admission) Patient appears euvolemic on exam, and compensated.  Echo in 2012 showed EF 60 to 65%, grade 3 diastolic dysfunction.  No pulmonary edema on chest x-ray.   BNP 1275.8, elevated -- Lasix held on admission due to lactic acidosis, getting IV fluids.  --Stopping fluids today, reassess and resume Lasix when able -- Reduced home metoprolol from 100 to 25 mg twice daily given soft BP -- Monitor volume status closely getting fluids -- Consider repeat echo  CKD (chronic kidney disease), stage IIIa- (present on admission) AKI on admission.  Baseline Cr around 1, presented with Cr 1.50. Renal function improved with hydration Cr today 1.05. -- Monitor BMP  Essential hypertension- (present on admission) Blood pressures been soft since admission, labile.  -- Reduced home metoprolol from 100 mg down to 25 mg twice daily -- Lasix held while getting IV fluids, now off fluids, resume Lasix when able -- Continue holding losartan  HLD (hyperlipidemia)- (present on admission) Continue statin  History of stroke Continue statin  Depression- (present on admission) Continued on home Effexor, Remeron  Bipolar 1 disorder (HCC)- (present on admission) Continue home olanzapine, Remeron and Effexor     Subjective: Patient was sleeping comfortably when seen today.  She is difficult to arouse but will wake  up briefly.  She does not interact much but will answer yes and no questions and quickly falls back to sleep.  No acute events reported.  She denies shortness of breath,  pain.  Unable to elicit any acute complaints.  Objective Vitals reviewed and notable for labile BP 143/82 this morning, 100/52 later this morning, 78% O2 sat charted at 4:41 AM.  Currently on 2 L/min [place] O2, intermittent mild tachycardia.  General exam: Sleeping comfortably, difficult to arouse, no acute distress HEENT: moist mucus membranes, keeps eyes closed Respiratory system: CTAB but diminished, no wheezes, rales or rhonchi, normal respiratory effort, on 2 L/min [place] O2. Cardiovascular system: normal S1/S2, RRR, no pedal edema.   Gastrointestinal system: soft, nondistended Central nervous system: Unable to evaluate at this time due to patient somnolence Extremities: Right third toe wound appears stable with eschar in place no surrounding swelling or erythema, hammertoe deformities noted Skin: dry, intact, normal temperature, pale appearing Psychiatry: Unable to assess at this time due to patient's somnolence  Data Reviewed:  Labs reviewed and notable for glucose 103, creatinine improved from 1.50-1.05, calcium 8.4, albumin 3.1, AST 69, CRP 10.1, lactic acid normalized 1.6, procalcitonin 0.11, hemoglobin 8.1 from 8.8 yesterday, no leukocytosis  Family Communication: None at bedside during encounter.  I attempted to call patient's husband twice this afternoon, did not reach him and did not leave voicemail per patient privacy.  Disposition: Status is: Inpatient  Remains inpatient appropriate because: Severity of illness with hypoxia and on IV therapies as outlined         Time spent: 35 minutes  Author: Ezekiel Slocumb, DO 04/12/2021 7:18 PM  For on call review [email or web address].

## 2021-04-12 NOTE — TOC Progression Note (Addendum)
Transition of Care [facility]) - Progression Note    Patient Details  Name: MANNING AVELLA MRN: [id number] Date of Birth: 10-08-1935  Transition of Care [facility]) CM/SW [place], RN Phone Number: 04/12/2021, 2:15 PM  Clinical Narrative:   Called to speak with the patient's husband Carloyn Manner [phone] , no answer, left a Vm for a return call  Completed Fl2, PASSr is pending, Clinical notes need to be sent once FL2 is signed and 30 day note is signed,   Carloyn Manner called me back and said no to [facility] He said that she has a RW, a 3 in 1, he has a Incentive spriometer at home, and a cane , She does not need additional DME, He said she can not go to rehab because if he is not around she freaks out, He is able to care for her at home, he would like to get Home health services again, they had [facility] in the past and would like to use them again, I notified Corene Cornea       Expected Discharge Plan and Services                                                 Social Determinants of Health (SDOH) Interventions    Readmission Risk Interventions No flowsheet data found.

## 2021-04-13 DIAGNOSIS — L089 Local infection of the skin and subcutaneous tissue, unspecified: Secondary | ICD-10-CM | POA: Diagnosis not present

## 2021-04-13 LAB — CBC WITH DIFFERENTIAL/PLATELET
Abs Immature Granulocytes: 0.01 10*3/uL (ref 0.00–0.07)
Basophils Absolute: 0 10*3/uL (ref 0.0–0.1)
Basophils Relative: 1 %
Eosinophils Absolute: 0 10*3/uL (ref 0.0–0.5)
Eosinophils Relative: 0 %
HCT: 29 % — ABNORMAL LOW (ref 36.0–46.0)
Hemoglobin: 9.5 g/dL — ABNORMAL LOW (ref 12.0–15.0)
Immature Granulocytes: 0 %
Lymphocytes Relative: 5 %
Lymphs Abs: 0.4 10*3/uL — ABNORMAL LOW (ref 0.7–4.0)
MCH: 37.1 pg — ABNORMAL HIGH (ref 26.0–34.0)
MCHC: 32.8 g/dL (ref 30.0–36.0)
MCV: 113.3 fL — ABNORMAL HIGH (ref 80.0–100.0)
Monocytes Absolute: 0.4 10*3/uL (ref 0.1–1.0)
Monocytes Relative: 4 %
Neutro Abs: 7.4 10*3/uL (ref 1.7–7.7)
Neutrophils Relative %: 90 %
Platelets: 188 10*3/uL (ref 150–400)
RBC: 2.56 MIL/uL — ABNORMAL LOW (ref 3.87–5.11)
RDW: 13.6 % (ref 11.5–15.5)
Smear Review: NORMAL
WBC: 8.2 10*3/uL (ref 4.0–10.5)
nRBC: 0 % (ref 0.0–0.2)

## 2021-04-13 LAB — COMPREHENSIVE METABOLIC PANEL
ALT: 41 U/L (ref 0–44)
AST: 45 U/L — ABNORMAL HIGH (ref 15–41)
Albumin: 3 g/dL — ABNORMAL LOW (ref 3.5–5.0)
Alkaline Phosphatase: 47 U/L (ref 38–126)
Anion gap: 8 (ref 5–15)
BUN: 23 mg/dL (ref 8–23)
CO2: 27 mmol/L (ref 22–32)
Calcium: 8.6 mg/dL — ABNORMAL LOW (ref 8.9–10.3)
Chloride: 102 mmol/L (ref 98–111)
Creatinine, Ser: 0.92 mg/dL (ref 0.44–1.00)
GFR, Estimated: >60 mL/min (ref >60)
Glucose, Bld: 123 mg/dL — ABNORMAL HIGH (ref 70–99)
Potassium: 4.5 mmol/L (ref 3.5–5.1)
Sodium: 137 mmol/L (ref 135–145)
Total Bilirubin: 0.7 mg/dL (ref 0.3–1.2)
Total Protein: 6.4 g/dL — ABNORMAL LOW (ref 6.5–8.1)

## 2021-04-13 LAB — C-REACTIVE PROTEIN: CRP: 9.6 mg/dL — ABNORMAL HIGH (ref <1.0)

## 2021-04-13 LAB — GLUCOSE, CAPILLARY
Glucose-Capillary: 105 mg/dL — ABNORMAL HIGH (ref 70–99)
Glucose-Capillary: 122 mg/dL — ABNORMAL HIGH (ref 70–99)
Glucose-Capillary: 133 mg/dL — ABNORMAL HIGH (ref 70–99)
Glucose-Capillary: 164 mg/dL — ABNORMAL HIGH (ref 70–99)

## 2021-04-13 MED ORDER — METOPROLOL TARTRATE 5 MG/5ML IV SOLN
5.0000 mg | Freq: Once | INTRAVENOUS | Status: DC
  Filled 2021-04-13: qty 5

## 2021-04-13 NOTE — Plan of Care (Signed)

## 2021-04-13 NOTE — Progress Notes (Signed)
Progress Note   Patient: Jenna Dudley [id number] DOB: July 19, 1935 DOA: 04/10/2021     2 DOS: the patient was seen and examined on 04/13/2021   Brief hospital course: 86 y.o. female with medical history significant of hypertension, hyperlipidemia, stroke, GERD, depression, spinal stenosis, chronic back pain, dementia, dCHF, bipolar disorder, partial small bowel obstruction, iron deficiency anemia, CKD-3, who presented to ED on 04/10/2021 with right foot pain.  She had dropped an orange juice bottle on her right foot 3 days prior, sustaining a wound on the 3rd toe.    Labs showed significant lactic acidosis 6.9>>3.2 after fluids, WBC 10.9k.  Covid-19 PCR positive.  BP soft 105/43, no hypoxia.  Chest xray negative.  Right foot xray with no fracture or signs of osteomyelitis.  Podiatry was consulted and patient admitted to medicine service.    Assessment and Plan * Right foot infection- (present on admission) Right third toe wound with eschar, no surrounding erythema warmth or swelling, no foul drainage.  Patient does have hammertoe deformities. -- Podiatry consulted on admission Recommendations: No current need for debridement Apply Betadine daily Continue antibiotics, may complete 7 days oral antibiotics and follow-up in office 1 week after discharge --Treated with Rocephin --Transition to [facility] tomorrow, 4 more days to complete 7. --Continue wound care and monitoring   COVID-19 virus infection- (present on admission) Present on admission.  Patient does not report any respiratory or GI symptoms.  Chest x-ray negative. She has been placed on 2 L nasal cannula oxygen however O2 sats are in the upper 90s to 100%.  Low-grade temperature this morning 100.1.  Patient seems quite lethargic on my encounter. -- Continue remdesivir -- Initially steroid deferred due to lack of hypoxia. Pt now on oxygen. -- Start IV steroids -- Supportive care --monitor inflammatory markers  CKD (chronic  kidney disease), stage IIIa- (present on admission) AKI on admission.  Baseline Cr around 1, presented with Cr 1.50. Renal function improved with hydration Cr today 1.05. -- Monitor BMP  Chronic diastolic CHF (congestive heart failure) ([place])- (present on admission) Patient appears euvolemic on exam, and compensated.  Echo in 2012 showed EF 60 to 123456, grade 3 diastolic dysfunction.  No pulmonary edema on chest x-ray.   BNP 1275.8, elevated -- Lasix held on admission due to lactic acidosis, getting IV fluids.  --Stopping fluids today, reassess and resume Lasix when able -- Reduced home metoprolol from 100 to 25 mg twice daily given soft BP -- Monitor volume status closely getting fluids -- Consider repeat echo  Hypokalemia- (present on admission) Present on admission with K3.0. Potassium improved to 3.7 today -- Monitor and replace K as needed  Elevated lactic acid level- (present on admission) Resolved.  Presented with lactic acid of 6.9.  Did not meet SIRS criteria, not septic shock.  Hemodynamically stable. Lactate improved with IV fluids, normal today at 1.6 -- Stop IV fluids  Bipolar 1 disorder ([place])- (present on admission) Continue home olanzapine, Remeron and Effexor  HLD (hyperlipidemia)- (present on admission) Continue statin  Depression- (present on admission) Continued on home Effexor, Remeron  History of stroke Continue statin  Essential hypertension- (present on admission) Blood pressures been soft since admission, labile.  -- Reduced home metoprolol from 100 mg down to 25 mg twice daily -- Lasix held while getting IV fluids, now off fluids, resume Lasix when able -- Continue holding losartan     Subjective: Patient was seen and examined at bedside this morning.  Very somnolent but arousable to voices.  Right third toe with ulcer noted on exam.  She was seen by podiatry on 04/10/2021.  Recommended to continue antibiotics for 7 days and follow-up in the office  in 1 week postdischarge.  Objective Vitals reviewed and notable for labile BP 143/82 this morning, 100/52 later this morning, 78% O2 sat charted at 4:41 AM.  Currently on 2 L/min [place] O2, intermittent mild tachycardia.  General exam: Hypersomnolent.  Arousable to voices in no acute distress. HEENT: Eyes" Respiratory system: Clear to auscultation no wheezes or rales. Cardiovascular system: Regular rate and rhythm no rubs or gallops.   Gastrointestinal system: Soft nontender normal bowel sounds present Central nervous system: Unable to assess due to hypersomnolence.   Extremities: Right third toe with ulcer.   Skin: No rashes or ulcerative lesions noted.   Psychiatry: Unable to assess mood due to hypersomnolence.  Data Reviewed:  Labs reviewed and notable for glucose 103, creatinine improved from 1.50-1.05, calcium 8.4, albumin 3.1, AST 69, CRP 10.1, lactic acid normalized 1.6, procalcitonin 0.11, hemoglobin 8.1 from 8.8 yesterday, no leukocytosis  Family Communication: None at bedside during encounter.  I attempted to call patient's husband twice this afternoon, did not reach him and did not leave voicemail per patient privacy.  Disposition: Status is: Inpatient  Remains inpatient appropriate because: Severity of illness with hypoxia and on IV therapies as outlined         Time spent: 35 minutes  Author: Kayleen Memos, DO 04/13/2021 10:49 AM  For on call review [email or web address].

## 2021-04-13 NOTE — Progress Notes (Signed)
Physical Therapy Treatment Patient Details Name: Jenna Dudley MRN: [id number] DOB: [birth year] Today's Date: 04/13/2021   History of Present Illness Jenna Dudley is a 86 y.o. female with medical history significant of hypertension, hyperlipidemia, stroke, GERD, depression, spinal stenosis, chronic back pain, dementia, dCHF, bipolar disorder, partial small bowel obstruction, iron deficiency anemia, CKD-3, who presents with right foot pain. No surgical intervention planned.    PT Comments    Pt is making gradual progress towards goals and continues to be limited by arousal level. Pt sleepy, but more alert this date and becomes wide awake during mobility, however then becomes sleepy again. +2 required for SPT to [facility] to void. Supine there-ex performed. Pt reports she misses her husband very much. Will continue to progress as able.   Recommendations for follow up therapy are one component of a multi-disciplinary discharge planning process, led by the attending physician.  Recommendations may be updated based on patient status, additional functional criteria and insurance authorization.  Follow Up Recommendations  Skilled nursing-short term rehab (<3 hours/day)     Assistance Recommended at Discharge Intermittent Supervision/Assistance  Patient can return home with the following A lot of help with bathing/dressing/bathroom;A lot of help with walking and/or transfers;Help with stairs or ramp for entrance;Assistance with cooking/housework   Equipment Recommendations  Other (comment)    Recommendations for Other Services       Precautions / Restrictions Precautions Precautions: Fall Restrictions Weight Bearing Restrictions: No     Mobility  Bed Mobility Overal bed mobility: Needs Assistance Bed Mobility: Supine to Sit     Supine to sit: Min guard     General bed mobility comments: follows commands, however impulsive and begins to move prior to PT instructions. Reminded of safety  with line/lead.    Transfers Overall transfer level: Needs assistance Equipment used: Rolling walker (2 wheels) Transfers: Bed to chair/wheelchair/BSC     Step pivot transfers: Min assist, +2 physical assistance       General transfer comment: impulsive and pt loses balance easily without physical assist. +2 for safety    Ambulation/Gait               General Gait Details: unable due to urge to use the bathroom. Fatigued quickly and returned back to bed   Stairs             Wheelchair Mobility    Modified Rankin (Stroke Patients Only)       Balance Overall balance assessment: Needs assistance Sitting-balance support: No upper extremity supported, Feet unsupported Sitting balance-Leahy Scale: Fair     Standing balance support: Bilateral upper extremity supported Standing balance-Leahy Scale: Poor                              Cognition Arousal/Alertness: Awake/alert Behavior During Therapy: WFL for tasks assessed/performed Overall Cognitive Status: Difficult to assess                                 General Comments: hard to understand as pt is very soft spoken. Pt is also [facility]        Exercises Other Exercises Other Exercises: able to perform SPT to [facility] with HHA +2. Pt impulsive and needs total assist for doffing mesh panties and hygiene. Other Exercises: Supine ther-ex performed on B LE including SLRs and heel slides. 10 reps with minimal patient participation.  General Comments        Pertinent Vitals/Pain Pain Assessment Pain Assessment: No/denies pain    Home Living                          Prior Function            PT Goals (current goals can now be found in the care plan section) Acute Rehab PT Goals Patient Stated Goal: none stated PT Goal Formulation: With patient Time For Goal Achievement: 04/18/21 Progress towards PT goals: Progressing toward goals    Frequency    Min  2X/week      PT Plan Current plan remains appropriate    Co-evaluation              AM-PAC PT "6 Clicks" Mobility   Outcome Measure  Help needed turning from your back to your side while in a flat bed without using bedrails?: A Little Help needed moving from lying on your back to sitting on the side of a flat bed without using bedrails?: A Little Help needed moving to and from a bed to a chair (including a wheelchair)?: A Lot Help needed standing up from a chair using your arms (e.g., wheelchair or bedside chair)?: A Lot Help needed to walk in hospital room?: Total Help needed climbing 3-5 steps with a railing? : Total 6 Click Score: 12    End of Session Equipment Utilized During Treatment: Oxygen Activity Tolerance: Patient tolerated treatment well Patient left: in bed;with bed alarm set Nurse Communication: Mobility status PT Visit Diagnosis: Other abnormalities of gait and mobility (R26.89)     Time: 1610-9604 PT Time Calculation (min) (ACUTE ONLY): 29 min  Charges:  $Therapeutic Exercise: 8-22 mins $Therapeutic Activity: 8-22 mins                     Elizabeth Palau, PT, DPT, GCS [phone]    Rylan Kaufmann 04/13/2021, 2:58 PM

## 2021-04-13 NOTE — Progress Notes (Signed)
°   04/12/21 2242  Assess: MEWS Score  Temp 97.7 F (36.5 C)  BP (!) 117/52  Pulse Rate 98  Resp (!) 26 (Notified Nurse Crystal of pt VS)  SpO2 98 %  O2 Device Nasal Cannula  O2 Flow Rate (L/min) 2.5 L/min  Assess: MEWS Score  MEWS Temp 0  MEWS Systolic 0  MEWS Pulse 0  MEWS RR 2  MEWS LOC 1  MEWS Score 3  MEWS Score Color Yellow  Assess: if the MEWS score is Yellow or Red  Were vital signs taken at a resting state? Yes  Focused Assessment No change from prior assessment  Does the patient meet 2 or more of the SIRS criteria? No  Does the patient have a confirmed or suspected source of infection? Yes  Provider and Rapid Response Notified? Yes  MEWS guidelines implemented *See Row Information* Yes  Treat  MEWS Interventions Escalated (See documentation below)  Pain Scale Faces  Pain Score 0  Take Vital Signs  Increase Vital Sign Frequency  Yellow: Q 2hr X 2 then Q 4hr X 2, if remains yellow, continue Q 4hrs  Escalate  MEWS: Escalate Yellow: discuss with charge nurse/RN and consider discussing with provider and RRT  Notify: Charge Nurse/RN  Name of Charge Nurse/RN Notified Erie Noe, RN  Date Charge Nurse/RN Notified 04/12/21  Time Charge Nurse/RN Notified 2130  Notify: Provider  Provider Name/Title Bishop Limbo  Date Provider Notified 04/12/21  Time Provider Notified 2300  Notification Type Face-to-face  Notification Reason Other (Comment) (Yellow MEWS score)  Provider response No new orders  Date of Provider Response 04/12/21  Time of Provider Response 2300  Notify: Rapid Response  Name of Rapid Response RN Notified Rapid response not needed at this time  Document  Patient Outcome Other (Comment) (Pt stable)  Progress note created (see row info) Yes  Assess: SIRS CRITERIA  SIRS Temperature  0  SIRS Pulse 1  SIRS Respirations  1  SIRS WBC 0  SIRS Score Sum  2

## 2021-04-13 NOTE — Progress Notes (Signed)
Patient had beats of V-tach several times during the shift, with no signs or symptoms of chest pain. NP text regarding abnormal rhythm. Per NP lab called to draw am labs to check electrolytes. Will continue to monitor patient status and check vital sings.  °

## 2021-04-13 NOTE — Evaluation (Signed)
Occupational Therapy Evaluation Patient Details Name: Jenna Dudley MRN: [id number] DOB: Jul 26, 1935 Today's Date: 04/13/2021   History of Present Illness Jenna Dudley is a 86 y.o. female with medical history significant of hypertension, hyperlipidemia, stroke, GERD, depression, spinal stenosis, chronic back pain, dementia, dCHF, bipolar disorder, partial small bowel obstruction, iron deficiency anemia, CKD-3, who presents with right foot pain. No surgical intervention planned.   Clinical Impression   Pt seen for OT evaluation this date in setting of acute hospitalization d/t R foot pain. In addition, pt with COVID currently. She presents with confusion at this time and is poor historian. She currently requires: MIN/MOD A for UB ADLs including oral care with cues to initiate/terminate/sequence. MAX/TOTAL A for LB ADLs. MIN A /CGA for sup to sit and essentially throws herself back in bed refusing further mobility attempts. OT positioned pt in bed with all needs met and in reach. Will continue to follow acutely. Recommend STR f/u OT services at this time. Should pt go home, significant DME needs will likely arise including hospital bed and need for 24/7 SUPV.      Recommendations for follow up therapy are one component of a multi-disciplinary discharge planning process, led by the attending physician.  Recommendations may be updated based on patient status, additional functional criteria and insurance authorization.   Follow Up Recommendations  Skilled nursing-short term rehab (<3 hours/day)    Assistance Recommended at Discharge Frequent or constant Supervision/Assistance  Patient can return home with the following A lot of help with walking and/or transfers;A lot of help with bathing/dressing/bathroom;Assistance with cooking/housework;Direct supervision/assist for medications management;Assistance with feeding;Direct supervision/assist for financial management;Assist for transportation;Help with  stairs or ramp for entrance    Functional Status Assessment  Patient has had a recent decline in their functional status and demonstrates the ability to make significant improvements in function in a reasonable and predictable amount of time.  Equipment Recommendations  Other (comment) (defer to next venue of care)    Recommendations for Other Services       Precautions / Restrictions Precautions Precautions: Fall Restrictions Weight Bearing Restrictions: No      Mobility Bed Mobility Overal bed mobility: Needs Assistance Bed Mobility: Supine to Sit, Sit to Supine     Supine to sit: Min guard, Min assist Sit to supine: Supervision   General bed mobility comments: essentially throws herself back into bed, refusingf further attmpts at mobilization    Transfers                   General transfer comment: pt refusal      Balance Overall balance assessment: Needs assistance Sitting-balance support: No upper extremity supported, Feet unsupported Sitting balance-Leahy Scale: Fair         Standing balance comment: unable to assess                           ADL either performed or assessed with clinical judgement   ADL                                         General ADL Comments: MIN/MOD A for UB ADLs including oral care with cues to initiate/terminate/sequence. MAX/TOTAL A for LB ADLs. MIN A /CGA for sup to sit and essentially throws herself back in bed refusing further mobility attempts.     Vision  Additional Comments: unable to fomrally assess d/t cognition     Perception     Praxis      Pertinent Vitals/Pain Pain Assessment Pain Assessment: PAINAD Breathing: normal Negative Vocalization: none Facial Expression: sad, frightened, frown Body Language: relaxed Consolability: distracted or reassured by voice/touch PAINAD Score: 2 Pain Location: unable to locate/quantify Pain Descriptors / Indicators: Guarding,  Grimacing Pain Intervention(s): Limited activity within patient's tolerance, Monitored during session, Repositioned     Hand Dominance     Extremity/Trunk Assessment Upper Extremity Assessment Upper Extremity Assessment: Generalized weakness   Lower Extremity Assessment Lower Extremity Assessment: Generalized weakness       Communication Communication Communication: HOH   Cognition Arousal/Alertness: Awake/alert Behavior During Therapy: WFL for tasks assessed/performed, Anxious Overall Cognitive Status: Difficult to assess                                 General Comments: hard to understand as pt is very soft spoken. Pt is also HOH. She is only oriented to self and most communication this session is essentially word salad. She does occasionally make a clear phrase including "I'm scared" but then unable to offer any more discernable information and seemingly shuts down with further question prompts.     General Comments       Exercises     Shoulder Instructions      Home Living Family/patient expects to be discharged to:: Private residence Living Arrangements: Alone Available Help at Discharge: Family Type of Home: House Home Access: Stairs to enter Technical brewer of Steps: 1 Entrance Stairs-Rails: Right Home Layout: One level                          Prior Functioning/Environment               Mobility Comments: most PLOF information gathered from previous evaluations. Pt poor historian          OT Problem List: Decreased strength;Decreased range of motion;Decreased activity tolerance;Pain;Decreased cognition;Decreased knowledge of use of DME or AE;Decreased safety awareness      OT Treatment/Interventions: Self-care/ADL training;Therapeutic exercise;DME and/or AE instruction;Therapeutic activities;Patient/family education    OT Goals(Current goals can be found in the care plan section) Acute Rehab OT Goals Patient Stated  Goal: for her to come around some and be able to participate more OT Goal Formulation: With family Time For Goal Achievement: 04/27/21 Potential to Achieve Goals: Fair ADL Goals Pt Will Perform Grooming: with set-up;with supervision;sitting (supported sitting with <10% cues to initiate task) Pt Will Transfer to Toilet: stand pivot transfer;bedside commode;with min assist Pt Will Perform Toileting - Clothing Manipulation and hygiene: with min assist;sitting/lateral leans Additional ADL Goal #1: Pt will tolerate 3-4 mins EOB sitting to engage in one familiar ADL task to improve tolerance for OOB activity and allow for more ADL transfer goal development.  OT Frequency: Min 2X/week    Co-evaluation              AM-PAC OT "6 Clicks" Daily Activity     Outcome Measure Help from another person eating meals?: A Little Help from another person taking care of personal grooming?: A Little Help from another person toileting, which includes using toliet, bedpan, or urinal?: A Lot Help from another person bathing (including washing, rinsing, drying)?: A Lot Help from another person to put on and taking off regular upper body clothing?: A Little Help  from another person to put on and taking off regular lower body clothing?: A Lot 6 Click Score: 15   End of Session Nurse Communication: Mobility status  Activity Tolerance: Other (comment) (limited by cognition, fearfullness/anxiousness.) Patient left: in bed;with bed alarm set;with family/visitor present  OT Visit Diagnosis: Unsteadiness on feet (R26.81);Muscle weakness (generalized) (M62.81);History of falling (Z91.81);Adult, failure to thrive (R62.7)                Time: 8412-8208 OT Time Calculation (min): 31 min Charges:  OT General Charges $OT Visit: 1 Visit OT Evaluation $OT Eval Moderate Complexity: 1 Mod OT Treatments $Self Care/Home Management : 8-22 mins  Gerrianne Scale, MS, OTR/L ascom [phone] 04/13/21, 5:56 PM

## 2021-04-14 DIAGNOSIS — L089 Local infection of the skin and subcutaneous tissue, unspecified: Secondary | ICD-10-CM | POA: Diagnosis not present

## 2021-04-14 LAB — CBC WITH DIFFERENTIAL/PLATELET
Abs Immature Granulocytes: 0.03 10*3/uL (ref 0.00–0.07)
Basophils Absolute: 0 10*3/uL (ref 0.0–0.1)
Basophils Relative: 1 %
Eosinophils Absolute: 0 10*3/uL (ref 0.0–0.5)
Eosinophils Relative: 0 %
HCT: 25.3 % — ABNORMAL LOW (ref 36.0–46.0)
Hemoglobin: 8.3 g/dL — ABNORMAL LOW (ref 12.0–15.0)
Immature Granulocytes: 0 %
Lymphocytes Relative: 8 %
Lymphs Abs: 0.6 10*3/uL — ABNORMAL LOW (ref 0.7–4.0)
MCH: 36.7 pg — ABNORMAL HIGH (ref 26.0–34.0)
MCHC: 32.8 g/dL (ref 30.0–36.0)
MCV: 111.9 fL — ABNORMAL HIGH (ref 80.0–100.0)
Monocytes Absolute: 0.5 10*3/uL (ref 0.1–1.0)
Monocytes Relative: 7 %
Neutro Abs: 6.4 10*3/uL (ref 1.7–7.7)
Neutrophils Relative %: 84 %
Platelets: 202 10*3/uL (ref 150–400)
RBC: 2.26 MIL/uL — ABNORMAL LOW (ref 3.87–5.11)
RDW: 13.7 % (ref 11.5–15.5)
Smear Review: NORMAL
WBC: 7.7 10*3/uL (ref 4.0–10.5)
nRBC: 0 % (ref 0.0–0.2)

## 2021-04-14 LAB — COMPREHENSIVE METABOLIC PANEL
ALT: 37 U/L (ref 0–44)
AST: 39 U/L (ref 15–41)
Albumin: 3 g/dL — ABNORMAL LOW (ref 3.5–5.0)
Alkaline Phosphatase: 41 U/L (ref 38–126)
Anion gap: 6 (ref 5–15)
BUN: 27 mg/dL — ABNORMAL HIGH (ref 8–23)
CO2: 28 mmol/L (ref 22–32)
Calcium: 8.9 mg/dL (ref 8.9–10.3)
Chloride: 107 mmol/L (ref 98–111)
Creatinine, Ser: 0.8 mg/dL (ref 0.44–1.00)
GFR, Estimated: >60 mL/min (ref >60)
Glucose, Bld: 107 mg/dL — ABNORMAL HIGH (ref 70–99)
Potassium: 4.2 mmol/L (ref 3.5–5.1)
Sodium: 141 mmol/L (ref 135–145)
Total Bilirubin: 0.4 mg/dL (ref 0.3–1.2)
Total Protein: 6.2 g/dL — ABNORMAL LOW (ref 6.5–8.1)

## 2021-04-14 LAB — C-REACTIVE PROTEIN: CRP: 5.8 mg/dL — ABNORMAL HIGH (ref <1.0)

## 2021-04-14 LAB — GLUCOSE, CAPILLARY: Glucose-Capillary: 109 mg/dL — ABNORMAL HIGH (ref 70–99)

## 2021-04-14 MED ORDER — ALBUTEROL SULFATE HFA 108 (90 BASE) MCG/ACT IN AERS: 2.0000 | INHALATION_SPRAY | Freq: Four times a day (QID) | RESPIRATORY_TRACT | 0 refills | Status: AC | PRN

## 2021-04-14 MED ORDER — ZINC SULFATE 220 (50 ZN) MG PO CAPS: 220.0000 mg | ORAL_CAPSULE | Freq: Every day | ORAL | 0 refills | Status: AC

## 2021-04-14 MED ORDER — ASCORBIC ACID 500 MG PO TABS: 500.0000 mg | ORAL_TABLET | Freq: Every day | ORAL | 0 refills | Status: AC

## 2021-04-14 MED ORDER — POVIDONE-IODINE 5 % EX SOLN
Freq: Every day | CUTANEOUS | Status: DC
  Filled 2021-04-14 (×2): qty 473

## 2021-04-14 MED ORDER — METOPROLOL TARTRATE 25 MG PO TABS: 25.0000 mg | ORAL_TABLET | Freq: Two times a day (BID) | ORAL | 0 refills | Status: DC

## 2021-04-14 MED ORDER — ZINC SULFATE 220 (50 ZN) MG PO CAPS
220.0000 mg | ORAL_CAPSULE | Freq: Every day | ORAL | Status: DC
  Administered 2021-04-14: 220 mg via ORAL
  Filled 2021-04-14: qty 1

## 2021-04-14 MED ORDER — POLYETHYLENE GLYCOL 3350 17 G PO PACK: 17.0000 g | PACK | Freq: Every day | ORAL | 0 refills | Status: DC | PRN

## 2021-04-14 MED ORDER — ASCORBIC ACID 500 MG PO TABS
500.0000 mg | ORAL_TABLET | Freq: Every day | ORAL | Status: DC
  Administered 2021-04-14: 500 mg via ORAL
  Filled 2021-04-14: qty 1

## 2021-04-14 MED ORDER — POVIDONE-IODINE 10 % EX SOLN: Freq: Every day | CUTANEOUS | 0 refills | Status: DC

## 2021-04-14 MED ORDER — OXYCODONE HCL 5 MG PO TABS
5.0000 mg | ORAL_TABLET | Freq: Two times a day (BID) | ORAL | 0 refills | Status: AC | PRN
Start: 2021-04-14 — End: 2021-04-19

## 2021-04-14 MED ORDER — BENZONATATE 100 MG PO CAPS: 100.0000 mg | ORAL_CAPSULE | Freq: Four times a day (QID) | ORAL | 0 refills | Status: AC | PRN

## 2021-04-14 MED ORDER — CEFDINIR 300 MG PO CAPS: 300.0000 mg | ORAL_CAPSULE | Freq: Two times a day (BID) | ORAL | 0 refills | Status: AC

## 2021-04-14 NOTE — Discharge Summary (Addendum)
Discharge Summary  Jenna Dudley NWG:[id number] DOB: Apr 06, 1935  PCP: Jovita Kussmaul, MD  Admit date: 04/10/2021 Discharge date: 04/14/2021  Time spent: 35 minutes.  Recommendations for Outpatient Follow-up:  Follow-up with podiatry within 1 week. Follow-up with your primary care provider in 1 to 2 weeks. Follow-up with your cardiologist in 1 to 2 weeks. Take your medications as prescribed. Continue PT OT with assistance and fall precautions.  Discharge Diagnoses:  Active Hospital Problems   Diagnosis Date Noted   Right foot infection 04/10/2021   COVID-19 virus infection 04/11/2021   Elevated lactic acid level 04/10/2021   Hypokalemia 04/10/2021   Chronic diastolic CHF (congestive heart failure) ([place]) 04/10/2021   CKD (chronic kidney disease), stage IIIa 04/10/2021   Bipolar 1 disorder ([place]) 05/29/2017   Essential hypertension 09/17/2014   History of stroke 09/17/2014   Depression 12/21/2010   HLD (hyperlipidemia) 12/21/2010    Resolved Hospital Problems  No resolved problems to display.    Discharge Condition: Stable  Diet recommendation: Resume previous diet  Vitals:   04/14/21 0601 04/14/21 0816  BP: (!) 124/53 (!) 148/62  Pulse: 96 (!) 108  Resp: 16 19  Temp: 97.7 F (36.5 C) 97.6 F (36.4 C)  SpO2: 96% 92%    History of present illness:  86 y.o. female with medical history significant of hypertension, hyperlipidemia, stroke, GERD, depression, spinal stenosis, chronic back pain, dementia, chronic dCHF, bipolar disorder, partial small bowel obstruction, iron deficiency anemia, CKD-3, who presented [facility] ED on 04/10/2021 with right foot pain.  She had dropped an orange juice bottle on her right foot 3 days prior to presentation, sustaining a wound on her 3rd right toe.  Work-up revealed ulcer right third toe for which podiatry was consulted.  Right foot xray revealed no acute finding as permitted by due to positioning, advanced first MTP  osteoarthritis.  COVID-19 screening test positive on 04/10/2021.  Seen by podiatry, no plan for surgical intervention.  Recommended 7 days of oral antibiotics, local wound care with Betadine daily to the toe, and outpatient follow-up a week after discharge.  04/14/2021: Patient was seen at bedside.  There were no acute events overnight.  She has no new complaints.  Hospital Course:  Principal Problem:   Right foot infection Active Problems:   Essential hypertension   History of stroke   Depression   HLD (hyperlipidemia)   Bipolar 1 disorder ([facility])   Elevated lactic acid level   Hypokalemia   Chronic diastolic CHF (congestive heart failure) ([facility])   CKD (chronic kidney disease), stage IIIa   COVID-19 virus infection  Right foot cellulitis (right third digit ulcer)infection- (present on admission) Right third toe ulcer with mild surrounding erythema, no foul drainage.  Patient does have hammertoe deformities. Seen by podiatry, Dr. Amalia Hailey on 04/10/2021. Recommendations: No current need for debridement Apply Betadine daily Continue antibiotics, may complete 7 days oral antibiotics and follow-up in office 1 week after discharge --Treated with Rocephin --Transition to [facility] on 04/13/2021 for DC planning. Continue local wound care with home health RN Follow-up with podiatry within 1 week.   COVID-19 virus infection- (present on admission) Present on admission.  Patient does not report any respiratory or GI symptoms.  Chest x-ray negative. She has been placed on 2 L nasal cannula oxygen however O2 sats are in the upper 90s to 100%.  Completed 4 days of IV antiviral, remdesivir. Vitamin C, D3, and zinc. Albuterol inhaler for wheezing or shortness of breath as needed Tessalon Perles  for cough as needed Follow-up with your PCP.  Resolved AKI  Baseline creatinine appears to be 0.8 with GFR greater than 60 She is back to her baseline renal function with creatinine of 0.80 and GFR greater  than 60 on 04/14/2021. Continue to avoid nephrotoxic agents.  Chronic diastolic CHF (congestive heart failure) ([place])- (present on admission) Patient appears euvolemic on exam, and compensated.  Echo in 2012 showed EF 60 to 37%, grade 3 diastolic dysfunction.  No pulmonary edema on chest x-ray.   BNP 1275.8 Resume home Lasix Continue home metoprolol, dose reduced to 25 mg twice daily from 100 mg twice daily due to soft BPs to avoid hypotension. Follow-up with your cardiologist in 1 to 2 weeks.   Resolved post repletion: Hypokalemia- (present on admission) Present on admission with K3.0.>  Serum potassium 4.0 on 04/14/2021.   Resolved elevated lactic acid level- (present on admission) Resolved.  Presented with lactic acid of 6.9.  Repeated lactic acid 1.6 on 04/12/2021.  She received IV fluid.  Bipolar 1 disorder ([place])- (present on admission) Continue home olanzapine, Remeron and Effexor   HLD (hyperlipidemia)- (present on admission) Continue statin   Depression- (present on admission) Continued on home Effexor, Remeron   History of stroke Continue statin   Essential hypertension- (present on admission) Blood pressures are soft. -- Reduced home metoprolol from 100 mg down to 25 mg twice daily Resume home Lasix Continue to hold off home losartan to avoid hypotension.  Discharge Exam: BP (!) 148/62 (BP Location: Left Arm)    Pulse (!) 108    Temp 97.6 F (36.4 C)    Resp 19    Ht 5' (1.524 m)    Wt 51.6 kg    LMP  (LMP Unknown)    SpO2 92%    BMI 22.22 kg/m  General: 86 y.o. year-old female well developed well nourished in no acute distress.  Alert and pleasantly interactive. Cardiovascular: Regular rate and rhythm with no rubs or gallops.  No thyromegaly or JVD noted.   Respiratory: Clear to auscultation with no wheezes or rales. Good inspiratory effort. Abdomen: Soft nontender nondistended with normal bowel sounds x4 quadrants. Musculoskeletal: No lower extremity edema  bilaterally.   Skin: Right third toe ulcer with faint surrounding erythema. Psychiatry: Mood is appropriate for condition and setting  Discharge Instructions You were cared for by a hospitalist during your hospital stay. If you have any questions about your discharge medications or the care you received while you were in the hospital after you are discharged, you can call the unit and asked to speak with the hospitalist on call if the hospitalist that took care of you is not available. Once you are discharged, your primary care physician will handle any further medical issues. Please note that NO REFILLS for any discharge medications will be authorized once you are discharged, as it is imperative that you return to your primary care physician (or establish a relationship with a primary care physician if you do not have one) for your aftercare needs so that they can reassess your need for medications and monitor your lab values.   Allergies as of 04/14/2021       Reactions   Aspirin Other (See Comments)   Unknown reaction   Sulfa Antibiotics Other (See Comments)   Unknown reaction        Medication List     STOP taking these medications    ferrous sulfate 325 (65 FE) MG EC tablet   losartan 25  MG tablet Commonly known as: COZAAR   OLANZapine 20 MG tablet Commonly known as: ZYPREXA       TAKE these medications    acetaminophen-codeine 300-30 MG tablet Commonly known as: TYLENOL #3 Take 1 tablet by mouth every 8 (eight) hours as needed for moderate pain or severe pain.   Acidophilus/Pectin Caps Take by mouth.   albuterol 108 (90 Base) MCG/ACT inhaler Commonly known as: VENTOLIN HFA Inhale 2 puffs into the lungs every 6 (six) hours as needed for wheezing or shortness of breath.   ascorbic acid 500 MG tablet Commonly known as: VITAMIN C Take 1 tablet (500 mg total) by mouth daily.   benzonatate 100 MG capsule Commonly known as: Tessalon Perles Take 1 capsule (100 mg  total) by mouth every 6 (six) hours as needed for cough.   cefdinir 300 MG capsule Commonly known as: OMNICEF Take 1 capsule (300 mg total) by mouth every 12 (twelve) hours for 5 days.   cholecalciferol 1000 units tablet Commonly known as: VITAMIN D Take 1,000 Units by mouth daily.   furosemide 20 MG tablet Commonly known as: LASIX Take 2 tablets (40 mg total) by mouth daily.   magnesium oxide 400 MG tablet Commonly known as: MAG-OX Take 400 mg by mouth daily.   metoprolol tartrate 25 MG tablet Commonly known as: LOPRESSOR Take 1 tablet (25 mg total) by mouth 2 (two) times daily. What changed:  medication strength how much to take   mirtazapine 45 MG tablet Commonly known as: REMERON Take 1 tablet (45 mg total) by mouth at bedtime.   naloxone 4 MG/0.1ML Liqd nasal spray kit Commonly known as: NARCAN SMARTSIG:1 Spray(s) Both Nares Daily PRN   omeprazole 20 MG capsule Commonly known as: PRILOSEC Take 20 mg by mouth 2 (two) times daily before a meal.   oxyCODONE 5 MG immediate release tablet Commonly known as: Roxicodone Take 1 tablet (5 mg total) by mouth 2 (two) times daily as needed for up to 5 days for severe pain.   polyethylene glycol 17 g packet Commonly known as: MiraLax Take 17 g by mouth daily as needed for mild constipation. What changed: reasons to take this   povidone-iodine 10 % external solution Commonly known as: BETADINE Apply topically daily.   PROBIOTIC DAILY PO Take by mouth daily.   senna-docusate 8.6-50 MG tablet Commonly known as: Senokot-S Take 1 tablet by mouth 2 (two) times daily. Stool softener   simvastatin 40 MG tablet Commonly known as: ZOCOR Take 40 mg by mouth at bedtime.   venlafaxine XR 150 MG 24 hr capsule Commonly known as: EFFEXOR-XR 1 po daily   vitamin B-12 500 MCG tablet Commonly known as: CYANOCOBALAMIN Take 1 tablet (500 mcg total) by mouth daily.   zinc sulfate 220 (50 Zn) MG capsule Take 1 capsule (220 mg  total) by mouth daily.       Allergies  Allergen Reactions   Aspirin Other (See Comments)    Unknown reaction   Sulfa Antibiotics Other (See Comments)    Unknown reaction    Follow-up Information     Aderoju, Jadene Pierini, MD. Call today.   Specialty: Internal Medicine Why: Please call for a posthospital follow-up appointment. Contact information: [place] [address] [phone]         Wellington Hampshire, MD .   Specialty: Cardiology Contact information: [address] [place] [address] [phone]         Daylene Katayama  M, DPM. Call today.   Specialty: Podiatry Why: Please call for a posthospital follow-up appointment. Contact information: [place] [address] [phone]                  The results of significant diagnostics from this hospitalization (including imaging, microbiology, ancillary and laboratory) are listed below for reference.    Significant Diagnostic Studies: DG Chest Portable 1 View  Result Date: 04/10/2021 CLINICAL DATA:  Sepsis, tremors, history of CHF EXAM: PORTABLE CHEST 1 VIEW COMPARISON:  12/13/2020 chest radiograph. FINDINGS: Stable cardiomediastinal silhouette with normal heart size. No pneumothorax. No pleural effusion. Lungs appear clear, with no acute consolidative airspace disease and no pulmonary edema. Surgical clips are seen in the right upper quadrant of the abdomen. IMPRESSION: No active disease. Electronically Signed   By: Ilona Sorrel M.D.   On: 04/10/2021 08:31   DG Foot Complete Right  Result Date: 04/10/2021 CLINICAL DATA:  Right third toe pain after trauma. Injury 5 days ago. EXAM: RIGHT FOOT COMPLETE - 3+ VIEW COMPARISON:  None. FINDINGS: Typical assessment of the digits due to hammertoe deformity and foreshortening of the digits on the frontal views. No visible acute fracture or dislocation. Severe first MTP osteoarthritis. Generalized  osteopenia. Non eroded heel spur. IMPRESSION: 1. No acute finding as permitted by toe positioning. 2. Advanced first MTP osteoarthritis. Electronically Signed   By: Jorje Guild M.D.   On: 04/10/2021 07:02    Microbiology: Recent Results (from the past 240 hour(s))  Blood culture (single)     Status: None (Preliminary result)   Collection Time: 04/10/21  6:30 AM   Specimen: BLOOD  Result Value Ref Range Status   Specimen Description BLOOD RIGHT ANTECUBITAL  Final   Special Requests   Final    BOTTLES DRAWN AEROBIC AND ANAEROBIC Blood Culture adequate volume   Culture   Final    NO GROWTH 4 DAYS Performed at [facility], [address], [place], [place] [address]    Report Status PENDING  Incomplete  Resp Panel by RT-PCR (Flu A&B, Covid) Nasopharyngeal Swab     Status: Abnormal   Collection Time: 04/10/21  9:27 AM   Specimen: Nasopharyngeal Swab; Nasopharyngeal(NP) swabs in vial transport medium  Result Value Ref Range Status   SARS Coronavirus 2 by RT PCR POSITIVE (A) NEGATIVE Final    Comment: (NOTE) SARS-CoV-2 target nucleic acids are DETECTED.  The SARS-CoV-2 RNA is generally detectable in upper respiratory specimens during the acute phase of infection. Positive results are indicative of the presence of the identified virus, but do not rule out bacterial infection or co-infection with other pathogens not detected by the test. Clinical correlation with patient history and other diagnostic information is necessary to determine patient infection status. The expected result is Negative.  Fact Sheet for Patients: EntrepreneurPulse.com.au  Fact Sheet for Healthcare Providers: IncredibleEmployment.be  This test is not yet approved or cleared by the [place] FDA and  has been authorized for detection and/or diagnosis of SARS-CoV-2 by FDA under an Emergency Use Authorization (EUA).  This EUA will remain in effect (meaning this  test can be used) for the duration of  the COVID-19 declaration under Section 564(b)(1) of the A ct, 21 U.S.C. section 360bbb-3(b)(1), unless the authorization is terminated or revoked sooner.     Influenza A by PCR NEGATIVE NEGATIVE Final   Influenza B by PCR NEGATIVE NEGATIVE Final    Comment: (NOTE) The Xpert Xpress SARS-CoV-2/FLU/RSV plus assay is intended  as an aid in the diagnosis of influenza from Nasopharyngeal swab specimens and should not be used as a sole basis for treatment. Nasal washings and aspirates are unacceptable for Xpert Xpress SARS-CoV-2/FLU/RSV testing.  Fact Sheet for Patients: EntrepreneurPulse.com.au  Fact Sheet for Healthcare Providers: IncredibleEmployment.be  This test is not yet approved or cleared by the [place] FDA and has been authorized for detection and/or diagnosis of SARS-CoV-2 by FDA under an Emergency Use Authorization (EUA). This EUA will remain in effect (meaning this test can be used) for the duration of the COVID-19 declaration under Section 564(b)(1) of the Act, 21 U.S.C. section 360bbb-3(b)(1), unless the authorization is terminated or revoked.  Performed at [facility], [address] [place], [place] [address]      Labs: Basic Metabolic Panel: Recent Labs  Lab 04/10/21 0630 04/11/21 6815 04/11/21 0848 04/12/21 0556 04/13/21 0611 04/14/21 0607  NA 136 136  --  137 137 141  K 3.0* 4.2  --  3.7 4.5 4.2  CL 94* 99  --  100 102 107  CO2 25 23  --  _0 GLUCOSE 126* 88  --  103* 123* 107*  BUN 16 22  --  20 23 27*  CREATININE 1.02* 1.50*  --  1.05* 0.92 0.80  CALCIUM 8.9 8.7*  --  8.4* 8.6* 8.9  MG 1.6*  --  1.8  --   --   --    Liver Function Tests: Recent Labs  Lab 04/10/21 0630 04/12/21 0556 04/13/21 0611 04/14/21 0607  AST 27 69* 45* 39  ALT 9 44 41 37  ALKPHOS 53 47 47 41  BILITOT 0.9 0.7 0.7 0.4  PROT 7.1 6.2* 6.4* 6.2*  ALBUMIN 3.7 3.1* 3.0* 3.0*    No results for input(s): LIPASE, AMYLASE in the last 168 hours. No results for input(s): AMMONIA in the last 168 hours. CBC: Recent Labs  Lab 04/10/21 0630 04/11/21 0607 04/12/21 0556 04/13/21 0611 04/14/21 0607  WBC 10.9* 10.1 7.8 8.2 7.7  NEUTROABS 8.8*  --  5.9 7.4 6.4  HGB 10.0* 8.8* 8.1* 9.5* 8.3*  HCT 30.8* 26.5* 24.7* 29.0* 25.3*  MCV 115.4* 111.8* 113.3* 113.3* 111.9*  PLT 301 272 188 188 202   Cardiac Enzymes: No results for input(s): CKTOTAL, CKMB, CKMBINDEX, TROPONINI in the last 168 hours. BNP: BNP (last 3 results) Recent Labs    12/15/20 0532 12/27/20 1158 04/10/21 0630  BNP 927.3* 947.6* 1,275.8*    ProBNP (last 3 results) No results for input(s): PROBNP in the last 8760 hours.  CBG: Recent Labs  Lab 04/13/21 0740 04/13/21 1219 04/13/21 1634 04/13/21 2331 04/14/21 0811  GLUCAP 105* 122* 133* 164* 109*       Signed:  Kayleen Memos, MD [facility] 04/14/2021, 10:26 AM

## 2021-04-14 NOTE — Care Management Important Message (Signed)
Important Message  Patient Details  Name: Jenna Dudley MRN: [id number] Date of Birth: [birth year]   Medicare Important Message Given:  Yes     Olegario Messier A Theodosia Bahena 04/14/2021, 10:10 AM

## 2021-04-15 LAB — CULTURE, BLOOD (SINGLE)
Culture: NO GROWTH
Special Requests: ADEQUATE

## 2021-04-19 ENCOUNTER — Encounter: Payer: Medicare Other | Admitting: Podiatry

## 2021-04-26 ENCOUNTER — Encounter: Payer: Medicare Other | Admitting: Podiatry

## 2021-05-03 NOTE — Progress Notes (Deleted)
Cardiology Office Note:    Date:  05/03/2021   ID:  Jenna Dudley, DOB 1935/08/13, MRN [id number]  PCP:  Aderoju, Marin Olp, MD  [facility] Cardiologist:  Lorine Bears, MD  [facility] Electrophysiologist:  None   Referring MD: Nadyne Coombes*   Chief Complaint: hospital follow-up  History of Present Illness:    Jenna Dudley is a 86 y.o. female with a hx of ***  Past Medical History:  Diagnosis Date   Bilateral carpal tunnel syndrome 07/27/2014   Bipolar disorder (HCC)    geropsych admission to [facility] in Dec 2018   Chronic diastolic CHF (congestive heart failure) (HCC)    a. 03/2017 Echo: EF 55-60%, no rwma, Gr1 DD, mild MR; b. 06/2019 Echo: EF 55-60%, no rwma, mod LVH, GrII DD. Nl RV size/fxn. RVSP 54.35mmHg. Mildly dil LA.   Chronic low back pain 07/29/2014   Degenerative arthritis of lumbar spine 07/27/2014   Dementia (HCC)    Depression    Esophageal spasm 07/29/2014   GERD (gastroesophageal reflux disease)    History of stress test    a. 04/2017 MV: low risk stress test.  EF 55-65%. Probable apical ant/apical, basal, and mid inflat attenuation artifact vs ischemia/scar.   Hyperlipemia 07/29/2014   Hypertension    Pneumonia    Spinal stenosis 07/29/2014    Past Surgical History:  Procedure Laterality Date   HIP SURGERY Right    INNER EAR SURGERY Right    REPLACEMENT TOTAL KNEE BILATERAL Bilateral 07/27/14    Current Medications: No outpatient medications have been marked as taking for the 05/06/21 encounter (Appointment) with Fransico Michael, Shervon Kerwin H, PA-C.     Allergies:   Aspirin and Sulfa antibiotics   Social History   Socioeconomic History   Marital status: Married    Spouse name: Not on file   Number of children: Not on file   Years of education: Not on file   Highest education level: Not on file  Occupational History    Comment: retired  Tobacco Use   Smoking status: Former    Types: Cigarettes    Quit date: 07/29/1975    Years  since quitting: 45.7   Smokeless tobacco: Never  Vaping Use   Vaping Use: Never used  Substance and Sexual Activity   Alcohol use: No    Alcohol/week: 0.0 standard drinks   Drug use: No   Sexual activity: Never    Comment: postmenopause  Other Topics Concern   Not on file  Social History Narrative   Not on file   Social Determinants of Health   Financial Resource Strain: Not on file  Food Insecurity: Not on file  Transportation Needs: Not on file  Physical Activity: Not on file  Stress: Not on file  Social Connections: Not on file     Family History: The patient's ***family history includes Hypertension in her mother; Stroke in her mother.  ROS:   Please see the history of present illness.    *** All other systems reviewed and are negative.  EKGs/Labs/Other Studies Reviewed:    The following studies were reviewed today: ***  EKG:  EKG is *** ordered today.  The ekg ordered today demonstrates ***  Recent Labs: 04/10/2021: B Natriuretic Peptide 1,275.8 04/11/2021: Magnesium 1.8 04/14/2021: ALT 37; BUN 27; Creatinine, Ser 0.80; Hemoglobin 8.3; Platelets 202; Potassium 4.2; Sodium 141  Recent Lipid Panel    Component Value Date/Time   CHOL 149 09/18/2014 0655   CHOL 144 03/30/2012 0512  TRIG 94 09/18/2014 0655   TRIG 103 03/30/2012 0512   HDL 33 (L) 09/18/2014 0655   HDL 33 (L) 03/30/2012 0512   CHOLHDL 4.5 09/18/2014 0655   VLDL 19 09/18/2014 0655   VLDL 21 03/30/2012 0512   LDLCALC 97 09/18/2014 0655   LDLCALC 90 03/30/2012 0512     Risk Assessment/Calculations:   {Does this patient have ATRIAL FIBRILLATION?:[id number]}   Physical Exam:    VS:  LMP  (LMP Unknown)     Wt Readings from Last 3 Encounters:  04/14/21 113 lb 12.1 oz (51.6 kg)  01/28/21 113 lb 2 oz (51.3 kg)  12/29/20 114 lb 4 oz (51.8 kg)     GEN: *** Well nourished, well developed in no acute distress HEENT: Normal NECK: No JVD; No carotid bruits LYMPHATICS: No  lymphadenopathy CARDIAC: ***RRR, no murmurs, rubs, gallops RESPIRATORY:  Clear to auscultation without rales, wheezing or rhonchi  ABDOMEN: Soft, non-tender, non-distended MUSCULOSKELETAL:  No edema; No deformity  SKIN: Warm and dry NEUROLOGIC:  Alert and oriented x 3 PSYCHIATRIC:  Normal affect   ASSESSMENT:    No diagnosis found. PLAN:    In order of problems listed above:  ***  Disposition: Follow up {follow up:15908} with ***   Shared Decision Making/Informed Consent   {Are you ordering a CV Procedure (e.g. stress test, cath, DCCV, TEE, etc)?   Press F2        :[id number]}    Signed, Ernst Cumpston Ardelle Lesches  05/03/2021 3:39 PM    [facility]

## 2021-05-06 ENCOUNTER — Ambulatory Visit: Payer: Medicare Other | Admitting: Nurse Practitioner

## 2021-05-06 NOTE — Progress Notes (Deleted)
Office Visit    Patient Name: Jenna Dudley Date of Encounter: 05/06/2021  Primary Care Provider:  Briscoe Burns Jadene Pierini, MD Primary Cardiologist:  Kathlyn Sacramento, MD  Chief Complaint    86 year old female with a history of chronic diastolic heart failure, hypertension, hyperlipidemia, stroke, CKD stage 3, iron deficiency anemia,  GERD, esophageal spasm, bipolar disorder, and dementia who presents for follow-up related to heart failure.   Past Medical History    Past Medical History:  Diagnosis Date   Bilateral carpal tunnel syndrome 07/27/2014   Bipolar disorder ([place])    geropsych admission to [facility] in Dec 2018   Chronic diastolic CHF (congestive heart failure) ([place])    a. 03/2017 Echo: EF 55-60%, no rwma, Gr1 DD, mild MR; b. 06/2019 Echo: EF 55-60%, no rwma, mod LVH, GrII DD. Nl RV size/fxn. RVSP 54.48mmHg. Mildly dil LA.   Chronic low back pain 07/29/2014   Degenerative arthritis of lumbar spine 07/27/2014   Dementia ([place])    Depression    Esophageal spasm 07/29/2014   GERD (gastroesophageal reflux disease)    History of stress test    a. 04/2017 MV: low risk stress test.  EF 55-65%. Probable apical ant/apical, basal, and mid inflat attenuation artifact vs ischemia/scar.   Hyperlipemia 07/29/2014   Hypertension    Pneumonia    Spinal stenosis 07/29/2014   Past Surgical History:  Procedure Laterality Date   HIP SURGERY Right    INNER EAR SURGERY Right    REPLACEMENT TOTAL KNEE BILATERAL Bilateral 07/27/14    Allergies  Allergies  Allergen Reactions   Aspirin Other (See Comments)    Unknown reaction   Sulfa Antibiotics Other (See Comments)    Unknown reaction    History of Present Illness    86 year old female with the above past medical history including chronic diastolic heart failure, hypertension, hyperlipidemia, stroke, CKD stage 3, iron deficiency anemia, GERD, esophageal spasm, bipolar disorder, and dementia.  Echocardiogram in January 2019  showed EF 55-60%, G1DD, mild MR. Lexiscan myoview in 2019 showed no evidence of ischemia, though study was suboptimal. She was hospitalized in September 2022 for small bowel obstruction. CT showed moderate R pleural effusion s/p R thoracentesis. Repeat echocardiogram at the time showed an EF 65%, she was started on Lasix. She was last seen in the office on 01/28/21 and was stable from a cardiac standpoint, though she did report occasional mild dyspnea on exertion. She presented to the ED on 04/10/21 with complaints of R foot pain. She was hospitalized from 04/10/21-04/14/21 in the setting of R foot cellulitis and Covid 19 infection. Lactic acid was elevated on admission. Chest x-ray was negative. R foot xray showed no signs of fracture or osteomyelitis. Podiatry was consulted. She was hypokalemic on admission (3.0 improved to 4.2), creatinine was also elevated (1.19 improved to 0.8). She was treated with IV fluids and antibiotics. Her BP was low and her losartan was held and metoprolol was reduced form 100mg  daily to 25 mg daily.  She was discharge home on 04/14/21 with recommended outpatient follow-up with podiatry.   She presents today for follow-up. Since her hospitalization  Chronic diastolic heart failure: Hypertension: Hyperlipidemia: CKD stage 3: R foot cellulitis: Disposition:   Home Medications    Current Outpatient Medications  Medication Sig Dispense Refill   acetaminophen-codeine (TYLENOL #3) 300-30 MG per tablet Take 1 tablet by mouth every 8 (eight) hours as needed for moderate pain or severe pain.     albuterol (VENTOLIN HFA) 108 (  90 Base) MCG/ACT inhaler Inhale 2 puffs into the lungs every 6 (six) hours as needed for wheezing or shortness of breath. 8 g 0   ascorbic acid (VITAMIN C) 500 MG tablet Take 1 tablet (500 mg total) by mouth daily. 30 tablet 0   benzonatate (TESSALON PERLES) 100 MG capsule Take 1 capsule (100 mg total) by mouth every 6 (six) hours as needed for cough. 30  capsule 0   cholecalciferol (VITAMIN D) 1000 units tablet Take 1,000 Units by mouth daily.     furosemide (LASIX) 20 MG tablet Take 2 tablets (40 mg total) by mouth daily. 30 tablet 5   Lactobacillus Acid-Pectin (ACIDOPHILUS/PECTIN) CAPS Take by mouth.     magnesium oxide (MAG-OX) 400 MG tablet Take 400 mg by mouth daily.     metoprolol tartrate (LOPRESSOR) 25 MG tablet Take 1 tablet (25 mg total) by mouth 2 (two) times daily. 60 tablet 0   mirtazapine (REMERON) 45 MG tablet Take 1 tablet (45 mg total) by mouth at bedtime. 30 tablet 6   naloxone (NARCAN) nasal spray 4 mg/0.1 mL SMARTSIG:1 Spray(s) Both Nares Daily PRN     omeprazole (PRILOSEC) 20 MG capsule Take 20 mg by mouth 2 (two) times daily before a meal.      polyethylene glycol (MIRALAX) 17 g packet Take 17 g by mouth daily as needed for mild constipation. 14 each 0   povidone-iodine (BETADINE) 10 % external solution Apply topically daily. 480 mL 0   Probiotic Product (PROBIOTIC DAILY PO) Take by mouth daily.     senna-docusate (SENOKOT-S) 8.6-50 MG tablet Take 1 tablet by mouth 2 (two) times daily. Stool softener 60 tablet 1   simvastatin (ZOCOR) 40 MG tablet Take 40 mg by mouth at bedtime.      venlafaxine XR (EFFEXOR-XR) 150 MG 24 hr capsule 1 po daily 30 capsule 6   vitamin B-12 (CYANOCOBALAMIN) 500 MCG tablet Take 1 tablet (500 mcg total) by mouth daily. 100 tablet 2   zinc sulfate 220 (50 Zn) MG capsule Take 1 capsule (220 mg total) by mouth daily. 30 capsule 0   No current facility-administered medications for this visit.   Facility-Administered Medications Ordered in Other Visits  Medication Dose Route Frequency Provider Last Rate Last Admin   0.9 %  sodium chloride infusion    Continuous PRN Dionne Bucy, CRNA   Continued from Pre-op at 08/07/18 1110   lactated ringers infusion   Intravenous Continuous Clapacs, Madie Reno, MD   New Bag at 07/09/19 1014     Review of Systems    ***.  All other systems reviewed and are  otherwise negative except as noted above.    Physical Exam    VS:  LMP  (LMP Unknown)  , BMI There is no height or weight on file to calculate BMI.     GEN: Well nourished, well developed, in no acute distress. HEENT: normal. Neck: Supple, no JVD, carotid bruits, or masses. Cardiac: RRR, no murmurs, rubs, or gallops. No clubbing, cyanosis, edema.  Radials/DP/PT 2+ and equal bilaterally.  Respiratory:  Respirations regular and unlabored, clear to auscultation bilaterally. GI: Soft, nontender, nondistended, BS + x 4. MS: no deformity or atrophy. Skin: warm and dry, no rash. Neuro:  Strength and sensation are intact. Psych: Normal affect.  Accessory Clinical Findings    ECG personally reviewed by me today - *** - no acute changes.  Lab Results  Component Value Date   WBC 7.7 04/14/2021   HGB  8.3 (L) 04/14/2021   HCT 25.3 (L) 04/14/2021   MCV 111.9 (H) 04/14/2021   PLT 202 04/14/2021   Lab Results  Component Value Date   CREATININE 0.80 04/14/2021   BUN 27 (H) 04/14/2021   NA 141 04/14/2021   K 4.2 04/14/2021   CL 107 04/14/2021   CO2 28 04/14/2021   Lab Results  Component Value Date   ALT 37 04/14/2021   AST 39 04/14/2021   ALKPHOS 41 04/14/2021   BILITOT 0.4 04/14/2021   Lab Results  Component Value Date   CHOL 149 09/18/2014   HDL 33 (L) 09/18/2014   LDLCALC 97 09/18/2014   TRIG 94 09/18/2014   CHOLHDL 4.5 09/18/2014    Lab Results  Component Value Date   HGBA1C 6.0 10/03/2015    Assessment & Plan    1.  ***   Lenna Sciara, NP 05/06/2021, 5:30 AM

## 2021-06-01 ENCOUNTER — Emergency Department: Payer: Medicare Other

## 2021-06-01 ENCOUNTER — Other Ambulatory Visit: Payer: Self-pay

## 2021-06-01 ENCOUNTER — Inpatient Hospital Stay
Admission: EM | Admit: 2021-06-01 | Discharge: 2021-06-14 | DRG: 853 | Disposition: A | Discharge status: Skilled Nursing Facility | Payer: Medicare Other | Attending: Internal Medicine | Admitting: Internal Medicine

## 2021-06-01 ENCOUNTER — Encounter: Payer: Self-pay | Admitting: Emergency Medicine

## 2021-06-01 DIAGNOSIS — Z79899 Other long term (current) drug therapy: Secondary | ICD-10-CM

## 2021-06-01 DIAGNOSIS — F05 Delirium due to known physiological condition: Secondary | ICD-10-CM

## 2021-06-01 DIAGNOSIS — R630 Anorexia: Secondary | ICD-10-CM

## 2021-06-01 DIAGNOSIS — E611 Iron deficiency: Secondary | ICD-10-CM | POA: Diagnosis present

## 2021-06-01 DIAGNOSIS — Z8673 Personal history of transient ischemic attack (TIA), and cerebral infarction without residual deficits: Secondary | ICD-10-CM

## 2021-06-01 DIAGNOSIS — R739 Hyperglycemia, unspecified: Secondary | ICD-10-CM | POA: Diagnosis present

## 2021-06-01 DIAGNOSIS — R7989 Other specified abnormal findings of blood chemistry: Secondary | ICD-10-CM

## 2021-06-01 DIAGNOSIS — Z8249 Family history of ischemic heart disease and other diseases of the circulatory system: Secondary | ICD-10-CM

## 2021-06-01 DIAGNOSIS — K219 Gastro-esophageal reflux disease without esophagitis: Secondary | ICD-10-CM | POA: Diagnosis present

## 2021-06-01 DIAGNOSIS — L97519 Non-pressure chronic ulcer of other part of right foot with unspecified severity: Secondary | ICD-10-CM | POA: Diagnosis present

## 2021-06-01 DIAGNOSIS — R778 Other specified abnormalities of plasma proteins: Secondary | ICD-10-CM

## 2021-06-01 DIAGNOSIS — I998 Other disorder of circulatory system: Secondary | ICD-10-CM

## 2021-06-01 DIAGNOSIS — Z90710 Acquired absence of both cervix and uterus: Secondary | ICD-10-CM

## 2021-06-01 DIAGNOSIS — Z6820 Body mass index (BMI) 20.0-20.9, adult: Secondary | ICD-10-CM

## 2021-06-01 DIAGNOSIS — J850 Gangrene and necrosis of lung: Secondary | ICD-10-CM

## 2021-06-01 DIAGNOSIS — F319 Bipolar disorder, unspecified: Secondary | ICD-10-CM | POA: Diagnosis present

## 2021-06-01 DIAGNOSIS — M86671 Other chronic osteomyelitis, right ankle and foot: Secondary | ICD-10-CM | POA: Diagnosis present

## 2021-06-01 DIAGNOSIS — A419 Sepsis, unspecified organism: Principal | ICD-10-CM

## 2021-06-01 DIAGNOSIS — I5033 Acute on chronic diastolic (congestive) heart failure: Secondary | ICD-10-CM | POA: Diagnosis present

## 2021-06-01 DIAGNOSIS — I130 Hypertensive heart and chronic kidney disease with heart failure and stage 1 through stage 4 chronic kidney disease, or unspecified chronic kidney disease: Secondary | ICD-10-CM | POA: Diagnosis present

## 2021-06-01 DIAGNOSIS — R410 Disorientation, unspecified: Secondary | ICD-10-CM

## 2021-06-01 DIAGNOSIS — E785 Hyperlipidemia, unspecified: Secondary | ICD-10-CM | POA: Diagnosis present

## 2021-06-01 DIAGNOSIS — I2699 Other pulmonary embolism without acute cor pulmonale: Secondary | ICD-10-CM

## 2021-06-01 DIAGNOSIS — E876 Hypokalemia: Secondary | ICD-10-CM

## 2021-06-01 DIAGNOSIS — K449 Diaphragmatic hernia without obstruction or gangrene: Secondary | ICD-10-CM | POA: Diagnosis present

## 2021-06-01 DIAGNOSIS — M869 Osteomyelitis, unspecified: Secondary | ICD-10-CM

## 2021-06-01 DIAGNOSIS — D696 Thrombocytopenia, unspecified: Secondary | ICD-10-CM | POA: Diagnosis not present

## 2021-06-01 DIAGNOSIS — Z96653 Presence of artificial knee joint, bilateral: Secondary | ICD-10-CM | POA: Diagnosis present

## 2021-06-01 DIAGNOSIS — Z9889 Other specified postprocedural states: Secondary | ICD-10-CM

## 2021-06-01 DIAGNOSIS — F0393 Unspecified dementia, unspecified severity, with mood disturbance: Secondary | ICD-10-CM | POA: Diagnosis present

## 2021-06-01 DIAGNOSIS — D638 Anemia in other chronic diseases classified elsewhere: Secondary | ICD-10-CM

## 2021-06-01 DIAGNOSIS — I739 Peripheral vascular disease, unspecified: Secondary | ICD-10-CM | POA: Diagnosis present

## 2021-06-01 DIAGNOSIS — E872 Acidosis, unspecified: Secondary | ICD-10-CM | POA: Diagnosis present

## 2021-06-01 DIAGNOSIS — I248 Other forms of acute ischemic heart disease: Secondary | ICD-10-CM | POA: Diagnosis present

## 2021-06-01 DIAGNOSIS — J189 Pneumonia, unspecified organism: Principal | ICD-10-CM

## 2021-06-01 DIAGNOSIS — E43 Unspecified severe protein-calorie malnutrition: Secondary | ICD-10-CM | POA: Insufficient documentation

## 2021-06-01 DIAGNOSIS — I2693 Single subsegmental pulmonary embolism without acute cor pulmonale: Secondary | ICD-10-CM | POA: Diagnosis present

## 2021-06-01 DIAGNOSIS — J90 Pleural effusion, not elsewhere classified: Secondary | ICD-10-CM

## 2021-06-01 DIAGNOSIS — D6959 Other secondary thrombocytopenia: Secondary | ICD-10-CM | POA: Diagnosis present

## 2021-06-01 DIAGNOSIS — Z8616 Personal history of COVID-19: Secondary | ICD-10-CM

## 2021-06-01 DIAGNOSIS — I5032 Chronic diastolic (congestive) heart failure: Secondary | ICD-10-CM | POA: Diagnosis present

## 2021-06-01 DIAGNOSIS — F03911 Unspecified dementia, unspecified severity, with agitation: Secondary | ICD-10-CM | POA: Diagnosis present

## 2021-06-01 DIAGNOSIS — M1990 Unspecified osteoarthritis, unspecified site: Secondary | ICD-10-CM

## 2021-06-01 DIAGNOSIS — R6520 Severe sepsis without septic shock: Secondary | ICD-10-CM | POA: Diagnosis present

## 2021-06-01 DIAGNOSIS — Z823 Family history of stroke: Secondary | ICD-10-CM

## 2021-06-01 DIAGNOSIS — R079 Chest pain, unspecified: Secondary | ICD-10-CM

## 2021-06-01 DIAGNOSIS — Z6821 Body mass index (BMI) 21.0-21.9, adult: Secondary | ICD-10-CM

## 2021-06-01 DIAGNOSIS — I70261 Atherosclerosis of native arteries of extremities with gangrene, right leg: Secondary | ICD-10-CM | POA: Diagnosis present

## 2021-06-01 DIAGNOSIS — N1831 Chronic kidney disease, stage 3a: Secondary | ICD-10-CM | POA: Diagnosis present

## 2021-06-01 DIAGNOSIS — D539 Nutritional anemia, unspecified: Secondary | ICD-10-CM | POA: Diagnosis present

## 2021-06-01 DIAGNOSIS — R0902 Hypoxemia: Secondary | ICD-10-CM | POA: Diagnosis present

## 2021-06-01 DIAGNOSIS — Z86711 Personal history of pulmonary embolism: Secondary | ICD-10-CM

## 2021-06-01 DIAGNOSIS — Z87891 Personal history of nicotine dependence: Secondary | ICD-10-CM

## 2021-06-01 DIAGNOSIS — Z09 Encounter for follow-up examination after completed treatment for conditions other than malignant neoplasm: Secondary | ICD-10-CM

## 2021-06-01 DIAGNOSIS — J918 Pleural effusion in other conditions classified elsewhere: Secondary | ICD-10-CM | POA: Diagnosis present

## 2021-06-01 DIAGNOSIS — E8809 Other disorders of plasma-protein metabolism, not elsewhere classified: Secondary | ICD-10-CM | POA: Diagnosis present

## 2021-06-01 DIAGNOSIS — J690 Pneumonitis due to inhalation of food and vomit: Secondary | ICD-10-CM

## 2021-06-01 NOTE — ED Provider Notes (Signed)
[facility] Provider Note    Event Date/Time   First MD Initiated Contact with Patient 06/01/21 2322     (approximate)   History   Chest pain  Level V caveat: Limited by poor historian  HPI  Jenna Dudley is a 86 y.o. female brought to the ED ED via EMS from home with a chief complaint of chest pain.  Patient with a history of bipolar disorder, CHF not on home oxygen, hypertension, esophageal spasms, COVID-19 in January 2023, nonhealing right foot wound who reports chest pain tonight, substernal, nonradiating without diaphoresis, shortness of breath, nausea/vomiting or palpitations.  Has been gave her Tylenol #3 for her foot pain.     Past Medical History   Past Medical History:  Diagnosis Date   Bilateral carpal tunnel syndrome 07/27/2014   Bipolar disorder ([place])    geropsych admission to [facility] in Dec 2018   Chronic diastolic CHF (congestive heart failure) ([place])    a. 03/2017 Echo: EF 55-60%, no rwma, Gr1 DD, mild MR; b. 06/2019 Echo: EF 55-60%, no rwma, mod LVH, GrII DD. Nl RV size/fxn. RVSP 54.43mmHg. Mildly dil LA.   Chronic low back pain 07/29/2014   Degenerative arthritis of lumbar spine 07/27/2014   Dementia ([place])    Depression    Esophageal spasm 07/29/2014   GERD (gastroesophageal reflux disease)    History of stress test    a. 04/2017 MV: low risk stress test.  EF 55-65%. Probable apical ant/apical, basal, and mid inflat attenuation artifact vs ischemia/scar.   Hyperlipemia 07/29/2014   Hypertension    Pneumonia    Spinal stenosis 07/29/2014     Active Problem List   Patient Active Problem List   Diagnosis Date Noted   Pneumonia 06/02/2021   Pulmonary emboli ([place]) 06/02/2021   Elevated troponin 06/02/2021   Chest pain 06/02/2021   Necrotizing pneumonia ([place])    Sepsis ([place])    COVID-19 virus infection 04/11/2021   Right foot infection 04/10/2021   Elevated lactic acid level 04/10/2021   Hypokalemia 04/10/2021   Chronic  diastolic CHF (congestive heart failure) ([place]) 04/10/2021   CKD (chronic kidney disease), stage IIIa 04/10/2021   Iron deficiency anemia 04/10/2021   CHF exacerbation ([place]) 12/10/2020   Dementia ([place])    HCAP (healthcare-associated pneumonia) 12/18/2017   Bipolar 1 disorder ([place]) 05/29/2017   CHF (congestive heart failure) ([place]) 04/23/2017   Accelerated hypertension 04/21/2017   Severe sepsis ([place]) 10/03/2015   Hypernatremia 10/03/2015   Lactic acidosis 10/03/2015   Leukocytosis 10/03/2015   Agitation 10/03/2015   Severe recurrent major depression without psychotic features ([place])    Major depressive disorder, recurrent episode, severe, with psychotic behavior ([place]) 10/13/2014   Severe episode of recurrent major depressive disorder, with psychotic features ([place]) 10/13/2014   Major depressive disorder, 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 features ([place]) 10/06/2014   Essential hypertension 09/17/2014   Chronic back pain 09/17/2014   Gastric reflux 09/17/2014   History of stroke 09/17/2014   Severe recurrent major depression with psychotic features ([place])    Acute on chronic diastolic CHF (congestive heart failure) ([place]) 07/29/2014   Partial small bowel obstruction ([place]) 07/29/2014   Hallux abductovalgus with bunions 09/12/2013   Hammer toe 09/12/2013   Foot pain 09/12/2013   Fungal infection of nail 09/12/2013   Carpal tunnel syndrome 12/21/2010   Chronic low back pain 12/21/2010   Degenerative arthritis of lumbar spine 12/21/2010   Depression 12/21/2010   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 Authorizing Provider   acetaminophen (TYLENOL) 325 MG tablet Take 650 mg by mouth every 6 (six) hours.   Yes [provider]  acetaminophen-codeine (TYLENOL #3) 300-30 MG per tablet Take 1 tablet by mouth every 8 (eight) hours as needed for moderate pain or severe pain.   Yes [provider]  albuterol (VENTOLIN HFA) 108 (90 Base) MCG/ACT inhaler Inhale 2 puffs into the lungs every 6 (six) hours as needed for wheezing or shortness of breath. 04/14/21  Yes Darlin Drop, DO  clindamycin (CLEOCIN) 300 MG capsule Take 300 mg by mouth every 6 (six) hours. 05/31/21  Yes [provider]  HYDROcodone-acetaminophen (NORCO/VICODIN) 5-325 MG tablet Take 1 tablet by mouth every 6 (six) hours as needed. 05/31/21  Yes [provider]  naloxone (NARCAN) nasal spray 4 mg/0.1 mL SMARTSIG:1 Spray(s) Both Nares Daily PRN 07/22/20  Yes [provider]  polyethylene glycol (MIRALAX) 17 g packet Take 17 g by mouth daily as needed for mild constipation. 04/14/21  Yes Hall, Carole N, DO  benzonatate (TESSALON PERLES) 100 MG capsule Take 1 capsule (100 mg total) by mouth every 6 (six) hours as needed for cough. 04/14/21 04/14/22  Darlin Drop, DO  cholecalciferol (VITAMIN D) 1000 units tablet Take 1,000 Units by mouth daily.    [provider]  furosemide (LASIX) 20 MG tablet Take 2 tablets (40 mg total) by mouth daily. 12/18/20   Pokhrel, Rebekah [place], MD  Lactobacillus Acid-Pectin (ACIDOPHILUS/PECTIN) CAPS Take by mouth.    [provider]  magnesium oxide (MAG-OX) 400 MG tablet Take 400 mg by mouth daily.    [provider]  metoprolol tartrate (LOPRESSOR) 25 MG tablet Take 1 tablet (25 mg total) by mouth 2 (two) times daily. 04/14/21 06/02/21  Darlin Drop, DO  mirtazapine (REMERON) 45 MG tablet Take 1 tablet (45 mg total) by mouth at bedtime. 02/10/20   Clapacs, Jackquline Denmark, MD  omeprazole (PRILOSEC) 20 MG capsule Take 20 mg by mouth 2 (two) times daily before a meal.     [provider]  povidone-iodine (BETADINE) 10 % external solution Apply topically daily. 04/14/21   Darlin Drop, DO  Probiotic Product (PROBIOTIC DAILY PO) Take by mouth daily.    [provider]  senna-docusate (SENOKOT-S) 8.6-50 MG tablet Take 1 tablet by mouth 2 (two) times daily. Stool softener 12/17/20 12/17/21  Pokhrel, Rebekah Chesterfield, MD  simvastatin (ZOCOR) 40 MG tablet Take 40 mg by mouth at bedtime.     [provider]  venlafaxine XR (EFFEXOR-XR) 150 MG 24 hr capsule 1 po daily 02/10/20   Clapacs, Jackquline Denmark, MD  vitamin B-12 (CYANOCOBALAMIN) 500 MCG tablet Take 1 tablet (500 mcg total) by mouth daily. 12/17/20 12/17/21  Pokhrel, Rebekah Chesterfield, MD     Allergies  Aspirin and Sulfa antibiotics   Family History   Family History  Problem Relation Age of Onset   Hypertension Mother    Stroke Mother      Physical Exam  Triage Vital Signs: ED Triage Vitals  Enc Vitals Group     BP  Pulse      Resp      Temp      Temp src      SpO2      Weight      Height      Head Circumference      Peak Flow      Pain Score      Pain Loc      Pain Edu?      Excl. in [place]?     Updated Vital Signs: BP (!) 123/47    Pulse 92    Temp 98.4 F (36.9 C) (Oral)    Resp (!) 24    Ht 5' (1.524 m)    Wt 51.6 kg    LMP  (LMP Unknown)    SpO2 100%    BMI 22.22 kg/m    General: Awake, mild distress.  Speech difficult to understand. CV:  Tachycardic.  Good peripheral perfusion.  Resp:  Increased effort.  Diminished breath sounds on the right. Abd:  Mildly tender to palpation epigastrium without rebound or guarding.  No distention.  Other:  Necrotic right third toe.    ED Results / Procedures / Treatments  Labs (all labs ordered are listed, but only abnormal results are displayed) Labs Reviewed  CBC WITH DIFFERENTIAL/PLATELET - Abnormal; Notable for the following components:      Result Value   WBC 11.1 (*)    RBC 2.25 (*)    Hemoglobin 8.1 (*)    HCT 25.8 (*)    MCV 114.7 (*)     MCH 36.0 (*)    Neutro Abs 8.0 (*)    Abs Immature Granulocytes 1.38 (*)    All other components within normal limits  COMPREHENSIVE METABOLIC PANEL - Abnormal; Notable for the following components:   Potassium 3.1 (*)    Chloride 96 (*)    Glucose, Bld 122 (*)    Calcium 8.4 (*)    Albumin 3.1 (*)    All other components within normal limits  LACTIC ACID, PLASMA - Abnormal; Notable for the following components:   Lactic Acid, Venous 4.0 (*)    All other components within normal limits  URINALYSIS, ROUTINE W REFLEX MICROSCOPIC - Abnormal; Notable for the following components:   Color, Urine YELLOW (*)    APPearance HAZY (*)    Leukocytes,Ua SMALL (*)    Bacteria, UA RARE (*)    All other components within normal limits  TROPONIN I (HIGH SENSITIVITY) - Abnormal; Notable for the following components:   Troponin I (High Sensitivity) 108 (*)    All other components within normal limits  TROPONIN I (HIGH SENSITIVITY) - Abnormal; Notable for the following components:   Troponin I (High Sensitivity) 94 (*)    All other components within normal limits  RESP PANEL BY RT-PCR (FLU A&B, COVID) ARPGX2  URINE CULTURE  CULTURE, BLOOD (ROUTINE X 2) W REFLEX TO ID PANEL  CULTURE, BLOOD (ROUTINE X 2) W REFLEX TO ID PANEL  EXPECTORATED SPUTUM ASSESSMENT W GRAM STAIN, RFLX TO RESP C  LIPASE, BLOOD  LACTIC ACID, PLASMA  PROTIME-INR  APTT  BASIC METABOLIC PANEL  MAGNESIUM  CBC  LEGIONELLA PNEUMOPHILA SEROGP 1 UR AG  STREP PNEUMONIAE URINARY ANTIGEN  PROCALCITONIN  HEPARIN LEVEL (UNFRACTIONATED)     EKG  ED ECG REPORT I, Jurell Basista J, the attending physician, personally viewed and interpreted this ECG.   Date: 06/02/2021  EKG Time: 2326  Rate: 109  Rhythm: sinus tachycardia  Axis: Normal  Intervals:none  ST&T Change: Nonspecific    RADIOLOGY I have independently visualized and reviewed patient's chest x-ray, CT chest/abdomen/pelvis as well as noted the radiology  interpretation:  Chest x-ray: Right lung infiltrate, small right pleural effusion  CT chest/abdomen/pelvis discussed with radiologist: Necrotizing right pneumonia, small PE without right heart strain, moderate to large right pleural effusion  Right foot x-ray: Improved soft tissue swelling from prior, no osteomyelitis  Official radiology report(s): CT Angio Chest PE W/Cm &/Or Wo Cm  Result Date: 06/02/2021 CLINICAL DATA:  Chest and abdomen pain. EXAM: CT ANGIOGRAPHY CHEST CT ABDOMEN AND PELVIS WITH CONTRAST TECHNIQUE: Multidetector CT imaging of the chest was performed using the standard protocol during bolus administration of intravenous contrast. Multiplanar CT image reconstructions and MIPs were obtained to evaluate the vascular anatomy. Multidetector CT imaging of the abdomen and pelvis was performed using the standard protocol during bolus administration of intravenous contrast. RADIATION DOSE REDUCTION: This exam was performed according to the departmental dose-optimization program which includes automated exposure control, adjustment of the mA and/or kV according to patient size and/or use of iterative reconstruction technique. CONTRAST:  54mL OMNIPAQUE IOHEXOL 350 MG/ML SOLN COMPARISON:  Abdomen and pelvis CT with contrast 12/11/2020, CT chest, abdomen pelvis with contrast 12/20/2017, CTA chest 04/17/2017 FINDINGS: CTA CHEST FINDINGS Cardiovascular: Pulmonary arteries are upper limit of normal in caliber. Nonoccluding linear thrombotic filling defect is seen in right upper lobe segmental and 1 or possibly 2 subsegmental upstream arteries to the apical area and is also seen in 3 right lower lobe segmental and several downstream subsegmental arteries. Overall clot burden is small without findings of acute right heart strain. There is cardiomegaly with left chamber predominance, three-vessel calcific CAD, and thinning and fatty infiltration in the apical myocardium again noted consistent with a prior  apical infarct. No pericardial effusion is seen. No other pulmonary embolus is seen. Superior pulmonary veins are slightly prominent but no more than previously. There are calcifications of the mitral ring, and heavy aortic calcific atherosclerosis,without aortic aneurysm, penetrating ulcer or dissection. There is atherosclerosis in the great vessels without flow-limiting stenosis. Mediastinum/Nodes: Axillary spaces are clear. The thyroid gland is unremarkable. There is interval increased diffuse thickening and patulous Ness of the esophagus. Small hiatal hernia. Findings may suggest diffuse esophagitis. Endoscopic follow-up may be indicated. There are increasingly mildly enlarged lymph nodes in the AP window, precarinal and subcarinal mediastinum, and mildly prominent lymph nodes in the lower right hilum. No tracheal lesion or fluid is seen. The central airways are clear and normal caliber. There is no bulky or encasing adenopathy. Lungs/Pleura: A moderate-to-large right pleural effusion is again demonstrated, similar to 12/11/2020 but was not seen in 2019. There is a small layering left pleural effusion which is new from both prior studies. Increased subpleural interstitial markings in the lung bases and apices are noted probably due to interstitial edema. In the right lower lobe anterior basal area there is fluid and debris in the area segmental bronchi and multiple downstream small bronchi. A confluent opacity extending from the hilum to the anterolateral pleural surface has developed in this segment measuring 6.3 by 3.0 cm on series 3 axial 79 with only scant air bronchograms only in its medial aspect. The area of consolidation contains an air-fluid level measuring 1.6 x 1.3 cm on axial 75 which is worrisome for a small pulmonary abscess with possibly necrotizing pneumonic process. Underlying mass is difficult to exclude but should not have reached this size in 6 months. There are additional  scattered  ground-glass opacities in the more central lungs bilaterally and in the peripheral partially aerated right lower lobe, which could be ground-glass edema, pneumonitis or combination. There is compressive atelectasis along side the right pleural effusion. Musculoskeletal: There is dextroscoliosis and multilevel bridging enthesopathy of the thoracic spine. No acute or significant osseous findings. Review of the MIP images confirms the above findings. CT ABDOMEN and PELVIS FINDINGS Hepatobiliary: As previously the liver enhancement is heterogeneous initially but homogeneous on delayed phase compatible with perfusional differences. There is no mass enhancement. Post cholecystectomy intrahepatic and extrahepatic biliary dilatation is seen with common bile duct 1.8 cm, previously 1.6 cm. There is no appreciable ductal stone no increased prominence in the pre-existing intrahepatic biliary dilatation. Pancreas: Atrophic without appreciable mass or ductal dilatation. Spleen: Normal in size and enhancement. Adrenals/Urinary Tract: There is no adrenal mass. There is scattered cortical scarring of the kidneys without visible cortical mass. There is no evidence of urinary stone or obstruction. No focal bladder abnormality. Stomach/Bowel: Small hiatal hernia. Chronic thickened folds in the stomach unchanged. No bowel obstruction or inflammation. An appendix is not seen. There is mild fecal stasis, without evidence of colitis/diverticulitis. Vascular/Lymphatic: Heavy extensive calcification in the aorta, iliac and visceral branch arteries is again noted. There is no AAA. Major branch vessels appear patent. The portal and splenic veins are patent and normal caliber. There is no adenopathy. Reproductive: Surgically absent uterus.  No adnexal mass. Other: There is body wall anasarca. There is trace ascites in the pelvis. Musculoskeletal: There is advanced degenerative change of the lumbar spine and chronic wedging of the T12 and L1  vertebrae, multilevel acquired spinal canal and foraminal stenosis lumbar spine. No acute regional skeletal findings. Mild-to-moderate hip DJD. Review of the MIP images confirms the above findings. IMPRESSION: 1. Nonoccluding linear thrombus in segmental and subsegmental arteries in the right upper and lower lobes, overall small clot burden common, no findings of acute right heart strain. 2. 6.3 x 3.0 cm confluent opacity from the hilum to the pleural surface, anterior basal segment right lower lobe. There are mildly enlarged mediastinal and hilar lymph nodes which could be reactive but are nonspecific. 3. Underlying mass difficult to exclude but this is more likely a necrotizing pneumonic process and contains a small 1.6 cm air-fluid level which is probably an abscess. There is mucoid debris or fluid opacifying segmental and downstream subsegmental bronchi in the area. 4. Cardiomegaly, calcific CAD, aortic atherosclerosis and chronic apical left ventricular infarct. 5. Evidence of at least mild interstitial edema, moderate-to-large left right pleural effusion which was seen on 12/11/2020, and new small left pleural effusion with increased body wall edema. 6. Additional ground-glass opacities which could indicate additional edema and or pneumonitis. 7. Again noted intrahepatic and extrahepatic postcholecystectomy biliary dilatation. It is not significantly changed since 12/11/2020 but has increased since 2019. Laboratory and clinical correlation advised. MRCP or ERCP may be helpful if warranted. 8. Diffuse increased esophageal thickening consistent with esophagitis with increased patulous esophagus appearance. Small hiatal hernia. Consider endoscopic follow-up if warranted. 9. Minimal pelvic ascites and remaining findings discussed above. 10. Discussed over the phone with Dr. Beather Arbour at 3:50 a.m., 06/02/2021. Electronically Signed   By: Telford Nab M.D.   On: 06/02/2021 03:51   CT Abdomen Pelvis W  Contrast  Result Date: 06/02/2021 CLINICAL DATA:  Chest and abdomen pain. EXAM: CT ANGIOGRAPHY CHEST CT ABDOMEN AND PELVIS WITH CONTRAST TECHNIQUE: Multidetector CT imaging of the chest was performed using the standard protocol during  bolus administration of intravenous contrast. Multiplanar CT image reconstructions and MIPs were obtained to evaluate the vascular anatomy. Multidetector CT imaging of the abdomen and pelvis was performed using the standard protocol during bolus administration of intravenous contrast. RADIATION DOSE REDUCTION: This exam was performed according to the departmental dose-optimization program which includes automated exposure control, adjustment of the mA and/or kV according to patient size and/or use of iterative reconstruction technique. CONTRAST:  28mL OMNIPAQUE IOHEXOL 350 MG/ML SOLN COMPARISON:  Abdomen and pelvis CT with contrast 12/11/2020, CT chest, abdomen pelvis with contrast 12/20/2017, CTA chest 04/17/2017 FINDINGS: CTA CHEST FINDINGS Cardiovascular: Pulmonary arteries are upper limit of normal in caliber. Nonoccluding linear thrombotic filling defect is seen in right upper lobe segmental and 1 or possibly 2 subsegmental upstream arteries to the apical area and is also seen in 3 right lower lobe segmental and several downstream subsegmental arteries. Overall clot burden is small without findings of acute right heart strain. There is cardiomegaly with left chamber predominance, three-vessel calcific CAD, and thinning and fatty infiltration in the apical myocardium again noted consistent with a prior apical infarct. No pericardial effusion is seen. No other pulmonary embolus is seen. Superior pulmonary veins are slightly prominent but no more than previously. There are calcifications of the mitral ring, and heavy aortic calcific atherosclerosis,without aortic aneurysm, penetrating ulcer or dissection. There is atherosclerosis in the great vessels without flow-limiting stenosis.  Mediastinum/Nodes: Axillary spaces are clear. The thyroid gland is unremarkable. There is interval increased diffuse thickening and patulous Ness of the esophagus. Small hiatal hernia. Findings may suggest diffuse esophagitis. Endoscopic follow-up may be indicated. There are increasingly mildly enlarged lymph nodes in the AP window, precarinal and subcarinal mediastinum, and mildly prominent lymph nodes in the lower right hilum. No tracheal lesion or fluid is seen. The central airways are clear and normal caliber. There is no bulky or encasing adenopathy. Lungs/Pleura: A moderate-to-large right pleural effusion is again demonstrated, similar to 12/11/2020 but was not seen in 2019. There is a small layering left pleural effusion which is new from both prior studies. Increased subpleural interstitial markings in the lung bases and apices are noted probably due to interstitial edema. In the right lower lobe anterior basal area there is fluid and debris in the area segmental bronchi and multiple downstream small bronchi. A confluent opacity extending from the hilum to the anterolateral pleural surface has developed in this segment measuring 6.3 by 3.0 cm on series 3 axial 79 with only scant air bronchograms only in its medial aspect. The area of consolidation contains an air-fluid level measuring 1.6 x 1.3 cm on axial 75 which is worrisome for a small pulmonary abscess with possibly necrotizing pneumonic process. Underlying mass is difficult to exclude but should not have reached this size in 6 months. There are additional scattered ground-glass opacities in the more central lungs bilaterally and in the peripheral partially aerated right lower lobe, which could be ground-glass edema, pneumonitis or combination. There is compressive atelectasis along side the right pleural effusion. Musculoskeletal: There is dextroscoliosis and multilevel bridging enthesopathy of the thoracic spine. No acute or significant osseous  findings. Review of the MIP images confirms the above findings. CT ABDOMEN and PELVIS FINDINGS Hepatobiliary: As previously the liver enhancement is heterogeneous initially but homogeneous on delayed phase compatible with perfusional differences. There is no mass enhancement. Post cholecystectomy intrahepatic and extrahepatic biliary dilatation is seen with common bile duct 1.8 cm, previously 1.6 cm. There is no appreciable ductal stone no increased  prominence in the pre-existing intrahepatic biliary dilatation. Pancreas: Atrophic without appreciable mass or ductal dilatation. Spleen: Normal in size and enhancement. Adrenals/Urinary Tract: There is no adrenal mass. There is scattered cortical scarring of the kidneys without visible cortical mass. There is no evidence of urinary stone or obstruction. No focal bladder abnormality. Stomach/Bowel: Small hiatal hernia. Chronic thickened folds in the stomach unchanged. No bowel obstruction or inflammation. An appendix is not seen. There is mild fecal stasis, without evidence of colitis/diverticulitis. Vascular/Lymphatic: Heavy extensive calcification in the aorta, iliac and visceral branch arteries is again noted. There is no AAA. Major branch vessels appear patent. The portal and splenic veins are patent and normal caliber. There is no adenopathy. Reproductive: Surgically absent uterus.  No adnexal mass. Other: There is body wall anasarca. There is trace ascites in the pelvis. Musculoskeletal: There is advanced degenerative change of the lumbar spine and chronic wedging of the T12 and L1 vertebrae, multilevel acquired spinal canal and foraminal stenosis lumbar spine. No acute regional skeletal findings. Mild-to-moderate hip DJD. Review of the MIP images confirms the above findings. IMPRESSION: 1. Nonoccluding linear thrombus in segmental and subsegmental arteries in the right upper and lower lobes, overall small clot burden common, no findings of acute right heart  strain. 2. 6.3 x 3.0 cm confluent opacity from the hilum to the pleural surface, anterior basal segment right lower lobe. There are mildly enlarged mediastinal and hilar lymph nodes which could be reactive but are nonspecific. 3. Underlying mass difficult to exclude but this is more likely a necrotizing pneumonic process and contains a small 1.6 cm air-fluid level which is probably an abscess. There is mucoid debris or fluid opacifying segmental and downstream subsegmental bronchi in the area. 4. Cardiomegaly, calcific CAD, aortic atherosclerosis and chronic apical left ventricular infarct. 5. Evidence of at least mild interstitial edema, moderate-to-large left right pleural effusion which was seen on 12/11/2020, and new small left pleural effusion with increased body wall edema. 6. Additional ground-glass opacities which could indicate additional edema and or pneumonitis. 7. Again noted intrahepatic and extrahepatic postcholecystectomy biliary dilatation. It is not significantly changed since 12/11/2020 but has increased since 2019. Laboratory and clinical correlation advised. MRCP or ERCP may be helpful if warranted. 8. Diffuse increased esophageal thickening consistent with esophagitis with increased patulous esophagus appearance. Small hiatal hernia. Consider endoscopic follow-up if warranted. 9. Minimal pelvic ascites and remaining findings discussed above. 10. Discussed over the phone with Dr. Beather Arbour at 3:50 a.m., 06/02/2021. Electronically Signed   By: Telford Nab M.D.   On: 06/02/2021 03:51   DG Chest Port 1 View  Result Date: 06/01/2021 CLINICAL DATA:  Mid chest pain. EXAM: PORTABLE CHEST 1 VIEW COMPARISON:  April 10, 2021 FINDINGS: Mild, diffusely increased interstitial lung markings are seen with mild prominence of the pulmonary vasculature. There is mild right suprahilar and infrahilar peribronchial cuffing with marked severity airspace disease seen within the mid right lung. There is a small right  pleural effusion. Elevation of the right hemidiaphragm is noted. No pneumothorax is identified. The heart size and mediastinal contours are within normal limits. There is marked severity calcification of the aortic arch. Degenerative changes seen throughout the thoracic spine. IMPRESSION: 1. Marked severity infiltrate within the mid right lung, with a superimposed component of congestive heart failure. 2. Small right pleural effusion. Electronically Signed   By: Virgina Norfolk M.D.   On: 06/01/2021 23:43   DG Foot Complete Right  Result Date: 06/02/2021 CLINICAL DATA:  Right foot third  toe injury 04/05/2021. EXAM: RIGHT FOOT COMPLETE - 3+ VIEW COMPARISON:  Right foot plain films 04/10/2021. FINDINGS: Osteopenia. No fracture or erosive lesion is seen. Hammertoe deformities limited assessment of the phalanges. There was previously disproportionate soft tissue swelling of third toe which is no longer seen. There are vascular calcifications in the distal foreleg. Hallux valgus and advanced first MTP joint DJD are again shown, with osteophytes along the articulation of the hallux head with the hallux sesamoid ossicles. There is additional joint narrowing and osteophytes of the tarsometatarsal and tarsal tarsal joints, moderate nonerosive plantar heel spur and a small nonerosive posterior calcaneal spur. IMPRESSION: 1. Limited assessment of the phalanges due to hammertoe deformities. No acute abnormality within study limitations. 2. Disproportionate swelling previously was noted in the third toe but not seen today. 3. Osteopenia and degenerative change, chronic hallux valgus. 4. Vascular calcifications in the distal foreleg. Electronically Signed   By: Telford Nab M.D.   On: 06/02/2021 01:27     PROCEDURES:  Critical Care performed: Yes, see critical care procedure note(s)  CRITICAL CARE Performed by: Paulette Blanch   Total critical care time: 60 minutes  Critical care time was exclusive of separately  billable procedures and treating other patients.  Critical care was necessary to treat or prevent imminent or life-threatening deterioration.  Critical care was time spent personally by me on the following activities: development of treatment plan with patient and/or surrogate as well as nursing, discussions with consultants, evaluation of patient's response to treatment, examination of patient, obtaining history from patient or surrogate, ordering and performing treatments and interventions, ordering and review of laboratory studies, ordering and review of radiographic studies, pulse oximetry and re-evaluation of patient's condition.   Marland Kitchen1-3 Lead EKG Interpretation Performed by: Paulette Blanch, MD Authorized by: Paulette Blanch, MD     Interpretation: abnormal     ECG rate:  110   ECG rate assessment: tachycardic     Rhythm: sinus tachycardia     Ectopy: none     Conduction: normal   Comments:     Patient placed on cardiac monitor to evaluate for arrhythmias   MEDICATIONS ORDERED IN ED: Medications  heparin ADULT infusion 100 units/mL (25000 units/237mL) (850 Units/hr Intravenous New Bag/Given 06/02/21 0418)  sodium chloride flush (NS) 0.9 % injection 3 mL (has no administration in time range)  acetaminophen (TYLENOL) tablet 650 mg (has no administration in time range)    Or  acetaminophen (TYLENOL) suppository 650 mg (has no administration in time range)  ondansetron (ZOFRAN) tablet 4 mg (has no administration in time range)    Or  ondansetron (ZOFRAN) injection 4 mg (has no administration in time range)  pantoprazole (PROTONIX) injection 40 mg (has no administration in time range)  pantoprazole (PROTONIX) EC tablet 40 mg (has no administration in time range)  albuterol (PROVENTIL) (2.5 MG/3ML) 0.083% nebulizer solution 2.5 mg (2.5 mg Nebulization Given 06/02/21 0104)  sodium chloride 0.9 % bolus 1,000 mL (0 mLs Intravenous Stopped 06/02/21 0354)    And  sodium chloride 0.9 % bolus 500 mL  (0 mLs Intravenous Stopped 06/02/21 0318)    And  sodium chloride 0.9 % bolus 250 mL (0 mLs Intravenous Stopped 06/02/21 0319)  vancomycin (VANCOCIN) IVPB 1000 mg/200 mL premix (0 mg Intravenous Stopped 06/02/21 0319)  nitroGLYCERIN (NITROGLYN) 2 % ointment 0.5 inch (0.5 inches Topical Given 06/02/21 0122)  potassium chloride SA (KLOR-CON M) CR tablet 40 mEq (40 mEq Oral Given 06/02/21 0122)  iohexol (OMNIPAQUE) 350  MG/ML injection 75 mL (75 mLs Intravenous Contrast Given 06/02/21 0225)  LORazepam (ATIVAN) injection 0.5 mg (0.5 mg Intravenous Given 06/02/21 0136)  heparin bolus via infusion 3,100 Units (3,100 Units Intravenous Bolus from Bag 06/02/21 0418)     IMPRESSION / MDM / ASSESSMENT AND PLAN / ED COURSE  I reviewed the triage vital signs and the nursing notes.                             86 year old female who presents with chest pain. Differential diagnosis includes, but is not limited to, ACS, aortic dissection, pulmonary embolism, cardiac tamponade, pneumothorax, pneumonia, pericarditis, myocarditis, GI-related causes including esophagitis/gastritis, and musculoskeletal chest wall pain.   I have personally reviewed patient's records and note her hospitalization records from 1/15-19/2023 for right foot wound treated with antibiotics  The patient is on the cardiac monitor to evaluate for evidence of arrhythmia and/or significant heart rate changes.  We will obtain cardiac panel and add blood cultures/lactic acid for necrotic-appearing toe which has a similar appearance to the photo taken during patient's most recent hospitalization.  However, will obtain x-ray to evaluate for osteomyelitis.  Clinical Course as of 06/02/21 0524  Thu Jun 02, 2021  0025 Patient remains tachypneic; placed on 2 L nasal cannula oxygen for comfort.  We will also administer nebulizer treatment.  Updated patient and spouse on x-ray results. [JS]  0047 Lactate 4.0; will initiate ED Code Sepsis protocol.  [JS]  0105 Noted  troponin 108; patient has aspirin allergy, will hold.  Applied nitroglycerin paste.  We will replete potassium for mild hypokalemia 3.1. [JS]  0105 Low-dose Ativan given for patient's sundowning; unable to obtain CT scans at this time due to agitation. [JS]  [id number] Sepsis reassessment: Lactic acid normalized  [JS]  0350 Discussed CT scan with radiologist which is concerning for necrotizing pneumonia with abscess, moderate to large right pleural effusion; unremarkable CT abdomen/pelvis. [JS]    Clinical Course User Index [JS] Paulette Blanch, MD     FINAL CLINICAL IMPRESSION(S) / ED DIAGNOSES   Final diagnoses:  Community acquired pneumonia of right lung, unspecified part of lung  Chest pain, unspecified type  Sepsis, due to unspecified organism, unspecified whether acute organ dysfunction present ([place])  Hypokalemia  Elevated troponin  Pleural effusion  Necrotizing pneumonia ([place])  Other acute pulmonary embolism without acute cor pulmonale ([place])     Rx / DC Orders   ED Discharge Orders     None        Note:  This document was prepared using Dragon voice recognition software and may include unintentional dictation errors.   Paulette Blanch, MD 06/02/21 [phone]

## 2021-06-01 NOTE — ED Triage Notes (Addendum)
EMS brings pt to room 16 from home; pt anxious, st "I'm scared, just scared of hospitals"; st that her husband gave her pain med (Tylenol #3 per EMS") for her foot tonight and began having mid CP, nonradiating with no accomp symptoms; EMS reports currently being tx for rt foot wound from injury in January (was admitted for such and has HH visits) ?

## 2021-06-02 ENCOUNTER — Inpatient Hospital Stay: Admit: 2021-06-02 | Payer: Medicare Other

## 2021-06-02 ENCOUNTER — Inpatient Hospital Stay: Payer: Medicare Other

## 2021-06-02 ENCOUNTER — Emergency Department: Payer: Medicare Other

## 2021-06-02 ENCOUNTER — Other Ambulatory Visit (HOSPITAL_COMMUNITY): Payer: Self-pay

## 2021-06-02 ENCOUNTER — Encounter: Payer: Self-pay | Admitting: Family Medicine

## 2021-06-02 DIAGNOSIS — R778 Other specified abnormalities of plasma proteins: Secondary | ICD-10-CM | POA: Diagnosis present

## 2021-06-02 DIAGNOSIS — I2609 Other pulmonary embolism with acute cor pulmonale: Secondary | ICD-10-CM | POA: Diagnosis not present

## 2021-06-02 DIAGNOSIS — Z86711 Personal history of pulmonary embolism: Secondary | ICD-10-CM | POA: Diagnosis not present

## 2021-06-02 DIAGNOSIS — F03911 Unspecified dementia, unspecified severity, with agitation: Secondary | ICD-10-CM | POA: Diagnosis present

## 2021-06-02 DIAGNOSIS — I130 Hypertensive heart and chronic kidney disease with heart failure and stage 1 through stage 4 chronic kidney disease, or unspecified chronic kidney disease: Secondary | ICD-10-CM | POA: Diagnosis present

## 2021-06-02 DIAGNOSIS — R410 Disorientation, unspecified: Secondary | ICD-10-CM | POA: Diagnosis not present

## 2021-06-02 DIAGNOSIS — D638 Anemia in other chronic diseases classified elsewhere: Secondary | ICD-10-CM

## 2021-06-02 DIAGNOSIS — L97519 Non-pressure chronic ulcer of other part of right foot with unspecified severity: Secondary | ICD-10-CM | POA: Diagnosis present

## 2021-06-02 DIAGNOSIS — R079 Chest pain, unspecified: Secondary | ICD-10-CM | POA: Diagnosis not present

## 2021-06-02 DIAGNOSIS — Z79899 Other long term (current) drug therapy: Secondary | ICD-10-CM | POA: Diagnosis not present

## 2021-06-02 DIAGNOSIS — R7989 Other specified abnormal findings of blood chemistry: Secondary | ICD-10-CM | POA: Diagnosis present

## 2021-06-02 DIAGNOSIS — R630 Anorexia: Secondary | ICD-10-CM | POA: Diagnosis not present

## 2021-06-02 DIAGNOSIS — I2693 Single subsegmental pulmonary embolism without acute cor pulmonale: Secondary | ICD-10-CM | POA: Diagnosis present

## 2021-06-02 DIAGNOSIS — J90 Pleural effusion, not elsewhere classified: Secondary | ICD-10-CM | POA: Diagnosis not present

## 2021-06-02 DIAGNOSIS — J918 Pleural effusion in other conditions classified elsewhere: Secondary | ICD-10-CM | POA: Diagnosis present

## 2021-06-02 DIAGNOSIS — I2699 Other pulmonary embolism without acute cor pulmonale: Secondary | ICD-10-CM | POA: Diagnosis present

## 2021-06-02 DIAGNOSIS — D696 Thrombocytopenia, unspecified: Secondary | ICD-10-CM | POA: Diagnosis not present

## 2021-06-02 DIAGNOSIS — F319 Bipolar disorder, unspecified: Secondary | ICD-10-CM | POA: Diagnosis not present

## 2021-06-02 DIAGNOSIS — E8809 Other disorders of plasma-protein metabolism, not elsewhere classified: Secondary | ICD-10-CM | POA: Diagnosis present

## 2021-06-02 DIAGNOSIS — E43 Unspecified severe protein-calorie malnutrition: Secondary | ICD-10-CM | POA: Diagnosis present

## 2021-06-02 DIAGNOSIS — I998 Other disorder of circulatory system: Secondary | ICD-10-CM | POA: Diagnosis not present

## 2021-06-02 DIAGNOSIS — I97618 Postprocedural hemorrhage and hematoma of a circulatory system organ or structure following other circulatory system procedure: Secondary | ICD-10-CM | POA: Diagnosis not present

## 2021-06-02 DIAGNOSIS — J690 Pneumonitis due to inhalation of food and vomit: Secondary | ICD-10-CM | POA: Diagnosis present

## 2021-06-02 DIAGNOSIS — A419 Sepsis, unspecified organism: Secondary | ICD-10-CM | POA: Diagnosis present

## 2021-06-02 DIAGNOSIS — I5033 Acute on chronic diastolic (congestive) heart failure: Secondary | ICD-10-CM | POA: Diagnosis present

## 2021-06-02 DIAGNOSIS — I248 Other forms of acute ischemic heart disease: Secondary | ICD-10-CM | POA: Diagnosis present

## 2021-06-02 DIAGNOSIS — J850 Gangrene and necrosis of lung: Secondary | ICD-10-CM | POA: Diagnosis present

## 2021-06-02 DIAGNOSIS — F0393 Unspecified dementia, unspecified severity, with mood disturbance: Secondary | ICD-10-CM | POA: Diagnosis present

## 2021-06-02 DIAGNOSIS — Z87891 Personal history of nicotine dependence: Secondary | ICD-10-CM | POA: Diagnosis not present

## 2021-06-02 DIAGNOSIS — D6959 Other secondary thrombocytopenia: Secondary | ICD-10-CM | POA: Diagnosis present

## 2021-06-02 DIAGNOSIS — M86671 Other chronic osteomyelitis, right ankle and foot: Secondary | ICD-10-CM | POA: Diagnosis present

## 2021-06-02 DIAGNOSIS — N1831 Chronic kidney disease, stage 3a: Secondary | ICD-10-CM | POA: Diagnosis present

## 2021-06-02 DIAGNOSIS — I70235 Atherosclerosis of native arteries of right leg with ulceration of other part of foot: Secondary | ICD-10-CM | POA: Diagnosis not present

## 2021-06-02 DIAGNOSIS — I5032 Chronic diastolic (congestive) heart failure: Secondary | ICD-10-CM | POA: Diagnosis not present

## 2021-06-02 DIAGNOSIS — E876 Hypokalemia: Secondary | ICD-10-CM

## 2021-06-02 DIAGNOSIS — M86672 Other chronic osteomyelitis, left ankle and foot: Secondary | ICD-10-CM | POA: Diagnosis not present

## 2021-06-02 DIAGNOSIS — Z8616 Personal history of COVID-19: Secondary | ICD-10-CM | POA: Diagnosis not present

## 2021-06-02 DIAGNOSIS — M869 Osteomyelitis, unspecified: Secondary | ICD-10-CM | POA: Diagnosis not present

## 2021-06-02 DIAGNOSIS — I70261 Atherosclerosis of native arteries of extremities with gangrene, right leg: Secondary | ICD-10-CM | POA: Diagnosis present

## 2021-06-02 DIAGNOSIS — Z7189 Other specified counseling: Secondary | ICD-10-CM | POA: Diagnosis not present

## 2021-06-02 DIAGNOSIS — L089 Local infection of the skin and subcutaneous tissue, unspecified: Secondary | ICD-10-CM

## 2021-06-02 DIAGNOSIS — R6520 Severe sepsis without septic shock: Secondary | ICD-10-CM | POA: Diagnosis present

## 2021-06-02 DIAGNOSIS — E872 Acidosis, unspecified: Secondary | ICD-10-CM | POA: Diagnosis present

## 2021-06-02 LAB — COMPREHENSIVE METABOLIC PANEL
ALT: 9 U/L (ref 0–44)
AST: 24 U/L (ref 15–41)
Albumin: 3.1 g/dL — ABNORMAL LOW (ref 3.5–5.0)
Alkaline Phosphatase: 42 U/L (ref 38–126)
Anion gap: 14 (ref 5–15)
BUN: 18 mg/dL (ref 8–23)
CO2: 25 mmol/L (ref 22–32)
Calcium: 8.4 mg/dL — ABNORMAL LOW (ref 8.9–10.3)
Chloride: 96 mmol/L — ABNORMAL LOW (ref 98–111)
Creatinine, Ser: 0.88 mg/dL (ref 0.44–1.00)
GFR, Estimated: >60 mL/min (ref >60)
Glucose, Bld: 122 mg/dL — ABNORMAL HIGH (ref 70–99)
Potassium: 3.1 mmol/L — ABNORMAL LOW (ref 3.5–5.1)
Sodium: 135 mmol/L (ref 135–145)
Total Bilirubin: 0.8 mg/dL (ref 0.3–1.2)
Total Protein: 7.2 g/dL (ref 6.5–8.1)

## 2021-06-02 LAB — CBC
HCT: 23.7 % — ABNORMAL LOW (ref 36.0–46.0)
Hemoglobin: 7.5 g/dL — ABNORMAL LOW (ref 12.0–15.0)
MCH: 36.6 pg — ABNORMAL HIGH (ref 26.0–34.0)
MCHC: 31.6 g/dL (ref 30.0–36.0)
MCV: 115.6 fL — ABNORMAL HIGH (ref 80.0–100.0)
Platelets: 279 10*3/uL (ref 150–400)
RBC: 2.05 MIL/uL — ABNORMAL LOW (ref 3.87–5.11)
RDW: 15 % (ref 11.5–15.5)
WBC: 10.2 10*3/uL (ref 4.0–10.5)
nRBC: 0 % (ref 0.0–0.2)

## 2021-06-02 LAB — CBC WITH DIFFERENTIAL/PLATELET
Abs Immature Granulocytes: 1.38 10*3/uL — ABNORMAL HIGH (ref 0.00–0.07)
Basophils Absolute: 0.1 10*3/uL (ref 0.0–0.1)
Basophils Relative: 1 %
Eosinophils Absolute: 0 10*3/uL (ref 0.0–0.5)
Eosinophils Relative: 0 %
HCT: 25.8 % — ABNORMAL LOW (ref 36.0–46.0)
Hemoglobin: 8.1 g/dL — ABNORMAL LOW (ref 12.0–15.0)
Immature Granulocytes: 12 %
Lymphocytes Relative: 9 %
Lymphs Abs: 1 10*3/uL (ref 0.7–4.0)
MCH: 36 pg — ABNORMAL HIGH (ref 26.0–34.0)
MCHC: 31.4 g/dL (ref 30.0–36.0)
MCV: 114.7 fL — ABNORMAL HIGH (ref 80.0–100.0)
Monocytes Absolute: 0.6 10*3/uL (ref 0.1–1.0)
Monocytes Relative: 6 %
Neutro Abs: 8 10*3/uL — ABNORMAL HIGH (ref 1.7–7.7)
Neutrophils Relative %: 72 %
Platelets: 361 10*3/uL (ref 150–400)
RBC: 2.25 MIL/uL — ABNORMAL LOW (ref 3.87–5.11)
RDW: 15 % (ref 11.5–15.5)
Smear Review: NORMAL
WBC: 11.1 10*3/uL — ABNORMAL HIGH (ref 4.0–10.5)
nRBC: 0 % (ref 0.0–0.2)

## 2021-06-02 LAB — PROTEIN, PLEURAL OR PERITONEAL FLUID: Total protein, fluid: <3 g/dL

## 2021-06-02 LAB — URINALYSIS, ROUTINE W REFLEX MICROSCOPIC
Bilirubin Urine: NEGATIVE
Glucose, UA: NEGATIVE mg/dL
Hgb urine dipstick: NEGATIVE
Ketones, ur: NEGATIVE mg/dL
Nitrite: NEGATIVE
Protein, ur: NEGATIVE mg/dL
Specific Gravity, Urine: 1.024 (ref 1.005–1.030)
pH: 5 (ref 5.0–8.0)

## 2021-06-02 LAB — BASIC METABOLIC PANEL
Anion gap: 12 (ref 5–15)
BUN: 17 mg/dL (ref 8–23)
CO2: 25 mmol/L (ref 22–32)
Calcium: 8 mg/dL — ABNORMAL LOW (ref 8.9–10.3)
Chloride: 100 mmol/L (ref 98–111)
Creatinine, Ser: 0.87 mg/dL (ref 0.44–1.00)
GFR, Estimated: >60 mL/min (ref >60)
Glucose, Bld: 119 mg/dL — ABNORMAL HIGH (ref 70–99)
Potassium: 3.6 mmol/L (ref 3.5–5.1)
Sodium: 137 mmol/L (ref 135–145)

## 2021-06-02 LAB — RESP PANEL BY RT-PCR (FLU A&B, COVID) ARPGX2
Influenza A by PCR: NEGATIVE
Influenza B by PCR: NEGATIVE
SARS Coronavirus 2 by RT PCR: NEGATIVE

## 2021-06-02 LAB — MAGNESIUM: Magnesium: 1.5 mg/dL — ABNORMAL LOW (ref 1.7–2.4)

## 2021-06-02 LAB — BODY FLUID CELL COUNT WITH DIFFERENTIAL
Eos, Fluid: 0 %
Lymphs, Fluid: 15 %
Monocyte-Macrophage-Serous Fluid: 6 %
Neutrophil Count, Fluid: 79 %
Other Cells, Fluid: 0 %
Total Nucleated Cell Count, Fluid: 417 cu mm

## 2021-06-02 LAB — HEMOGLOBIN: Hemoglobin: 7.1 g/dL — ABNORMAL LOW (ref 12.0–15.0)

## 2021-06-02 LAB — LIPASE, BLOOD: Lipase: 28 U/L (ref 11–51)

## 2021-06-02 LAB — LACTATE DEHYDROGENASE, PLEURAL OR PERITONEAL FLUID: LD, Fluid: 61 U/L — ABNORMAL HIGH (ref 3–23)

## 2021-06-02 LAB — VITAMIN B12: Vitamin B-12: 377 pg/mL (ref 180–914)

## 2021-06-02 LAB — PREPARE RBC (CROSSMATCH)

## 2021-06-02 LAB — PROCALCITONIN: Procalcitonin: <0.1 ng/mL

## 2021-06-02 LAB — APTT: aPTT: 31 seconds (ref 24–36)

## 2021-06-02 LAB — TROPONIN I (HIGH SENSITIVITY)
Troponin I (High Sensitivity): 108 ng/L (ref <18)
Troponin I (High Sensitivity): 94 ng/L — ABNORMAL HIGH (ref <18)

## 2021-06-02 LAB — PROTIME-INR
INR: 1.2 (ref 0.8–1.2)
Prothrombin Time: 15 seconds (ref 11.4–15.2)

## 2021-06-02 LAB — IRON AND TIBC
Iron: 59 ug/dL (ref 28–170)
Saturation Ratios: 31 % (ref 10.4–31.8)
TIBC: 189 ug/dL — ABNORMAL LOW (ref 250–450)
UIBC: 130 ug/dL

## 2021-06-02 LAB — STREP PNEUMONIAE URINARY ANTIGEN: Strep Pneumo Urinary Antigen: NEGATIVE

## 2021-06-02 LAB — GLUCOSE, PLEURAL OR PERITONEAL FLUID: Glucose, Fluid: 144 mg/dL

## 2021-06-02 LAB — FERRITIN: Ferritin: 366 ng/mL — ABNORMAL HIGH (ref 11–307)

## 2021-06-02 LAB — LACTIC ACID, PLASMA
Lactic Acid, Venous: 1.9 mmol/L (ref 0.5–1.9)
Lactic Acid, Venous: 4 mmol/L (ref 0.5–1.9)

## 2021-06-02 LAB — HEPARIN LEVEL (UNFRACTIONATED): Heparin Unfractionated: 0.11 IU/mL — ABNORMAL LOW (ref 0.30–0.70)

## 2021-06-02 MED ORDER — SODIUM CHLORIDE 0.9 % IV BOLUS (SEPSIS)
250.0000 mL | Freq: Once | INTRAVENOUS | Status: AC
  Administered 2021-06-02: 02:00:00 250 mL via INTRAVENOUS

## 2021-06-02 MED ORDER — PANTOPRAZOLE SODIUM 40 MG PO TBEC
40.0000 mg | DELAYED_RELEASE_TABLET | Freq: Every day | ORAL | Status: DC
  Administered 2021-06-02 – 2021-06-14 (×11): 40 mg via ORAL
  Filled 2021-06-02 (×11): qty 1

## 2021-06-02 MED ORDER — ALBUTEROL SULFATE (2.5 MG/3ML) 0.083% IN NEBU
2.5000 mg | INHALATION_SOLUTION | Freq: Once | RESPIRATORY_TRACT | Status: AC
  Administered 2021-06-02: 01:00:00 2.5 mg via RESPIRATORY_TRACT
  Filled 2021-06-02: qty 3

## 2021-06-02 MED ORDER — SODIUM CHLORIDE 0.9 % IV SOLN
500.0000 mg | INTRAVENOUS | Status: DC
  Administered 2021-06-02: 01:00:00 500 mg via INTRAVENOUS
  Filled 2021-06-02: qty 5

## 2021-06-02 MED ORDER — HEPARIN BOLUS VIA INFUSION
1500.0000 [IU] | Freq: Once | INTRAVENOUS | Status: AC
  Administered 2021-06-02: 14:00:00 1500 [IU] via INTRAVENOUS
  Filled 2021-06-02: qty 1500

## 2021-06-02 MED ORDER — SODIUM CHLORIDE 0.9% IV SOLUTION: Freq: Once | INTRAVENOUS | Status: AC

## 2021-06-02 MED ORDER — PANTOPRAZOLE SODIUM 40 MG IV SOLR
40.0000 mg | Freq: Once | INTRAVENOUS | Status: AC
  Administered 2021-06-02: 07:00:00 40 mg via INTRAVENOUS
  Filled 2021-06-02: qty 10

## 2021-06-02 MED ORDER — MAGNESIUM SULFATE 4 GM/100ML IV SOLN
4.0000 g | Freq: Once | INTRAVENOUS | Status: AC
  Administered 2021-06-02: 09:00:00 4 g via INTRAVENOUS
  Filled 2021-06-02: qty 100

## 2021-06-02 MED ORDER — SODIUM CHLORIDE 0.9% FLUSH
3.0000 mL | Freq: Two times a day (BID) | INTRAVENOUS | Status: DC
  Administered 2021-06-02 – 2021-06-14 (×19): 3 mL via INTRAVENOUS

## 2021-06-02 MED ORDER — SODIUM CHLORIDE 0.9 % IV SOLN
2.0000 g | INTRAVENOUS | Status: DC
  Administered 2021-06-02: 01:00:00 2 g via INTRAVENOUS
  Filled 2021-06-02: qty 20

## 2021-06-02 MED ORDER — FUROSEMIDE 40 MG PO TABS
40.0000 mg | ORAL_TABLET | Freq: Every day | ORAL | Status: DC
Start: 2021-06-02 — End: 2021-06-04
  Administered 2021-06-02 – 2021-06-03 (×2): 40 mg via ORAL
  Filled 2021-06-02 (×2): qty 1

## 2021-06-02 MED ORDER — LORAZEPAM 2 MG/ML IJ SOLN
0.5000 mg | Freq: Once | INTRAMUSCULAR | Status: AC
  Administered 2021-06-02: 02:00:00 0.5 mg via INTRAVENOUS
  Filled 2021-06-02: qty 1

## 2021-06-02 MED ORDER — POLYETHYLENE GLYCOL 3350 17 G PO PACK: 17.0000 g | PACK | Freq: Every day | ORAL | Status: DC | PRN

## 2021-06-02 MED ORDER — ORAL CARE MOUTH RINSE
15.0000 mL | Freq: Two times a day (BID) | OROMUCOSAL | Status: DC
  Administered 2021-06-02 – 2021-06-14 (×17): 15 mL via OROMUCOSAL

## 2021-06-02 MED ORDER — HALOPERIDOL LACTATE 5 MG/ML IJ SOLN
2.0000 mg | Freq: Four times a day (QID) | INTRAMUSCULAR | Status: DC | PRN
  Administered 2021-06-02: 21:00:00 2 mg via INTRAVENOUS
  Filled 2021-06-02: qty 1

## 2021-06-02 MED ORDER — POTASSIUM CHLORIDE 10 MEQ/100ML IV SOLN: 10.0000 meq | INTRAVENOUS | Status: DC

## 2021-06-02 MED ORDER — VANCOMYCIN HCL 1250 MG/250ML IV SOLN: 1250.0000 mg | INTRAVENOUS | Status: DC

## 2021-06-02 MED ORDER — HEPARIN (PORCINE) 25000 UT/250ML-% IV SOLN
14.0000 [IU]/kg/h | INTRAVENOUS | Status: DC
Start: 2021-06-02 — End: 2021-06-02

## 2021-06-02 MED ORDER — METOPROLOL TARTRATE 25 MG PO TABS
25.0000 mg | ORAL_TABLET | Freq: Two times a day (BID) | ORAL | Status: DC
  Administered 2021-06-02 – 2021-06-07 (×9): 25 mg via ORAL
  Filled 2021-06-02 (×10): qty 1

## 2021-06-02 MED ORDER — VITAMIN B-12 1000 MCG PO TABS
500.0000 ug | ORAL_TABLET | Freq: Every day | ORAL | Status: DC
  Administered 2021-06-02: 16:00:00 500 ug via ORAL
  Filled 2021-06-02: qty 1

## 2021-06-02 MED ORDER — SIMVASTATIN 20 MG PO TABS
40.0000 mg | ORAL_TABLET | Freq: Every day | ORAL | Status: DC
  Administered 2021-06-02 – 2021-06-13 (×12): 40 mg via ORAL
  Filled 2021-06-02 (×12): qty 2

## 2021-06-02 MED ORDER — SODIUM CHLORIDE 0.9 % IV SOLN
3.0000 g | Freq: Three times a day (TID) | INTRAVENOUS | Status: AC
  Administered 2021-06-02 – 2021-06-06 (×14): 3 g via INTRAVENOUS
  Filled 2021-06-02 (×2): qty 3
  Filled 2021-06-02: qty 8
  Filled 2021-06-02: qty 3
  Filled 2021-06-02 (×4): qty 8
  Filled 2021-06-02: qty 3
  Filled 2021-06-02 (×7): qty 8

## 2021-06-02 MED ORDER — SODIUM CHLORIDE 0.9 % IV SOLN: 500.0000 mg | Freq: Once | INTRAVENOUS | Status: DC

## 2021-06-02 MED ORDER — HEPARIN BOLUS VIA INFUSION
3100.0000 [IU] | Freq: Once | INTRAVENOUS | Status: AC
  Administered 2021-06-02: 04:00:00 3100 [IU] via INTRAVENOUS
  Filled 2021-06-02: qty 3100

## 2021-06-02 MED ORDER — ALBUTEROL SULFATE HFA 108 (90 BASE) MCG/ACT IN AERS: 2.0000 | INHALATION_SPRAY | Freq: Four times a day (QID) | RESPIRATORY_TRACT | Status: DC | PRN

## 2021-06-02 MED ORDER — HEPARIN SODIUM (PORCINE) 5000 UNIT/ML IJ SOLN: 60.0000 [IU]/kg | Freq: Once | INTRAMUSCULAR | Status: DC

## 2021-06-02 MED ORDER — VENLAFAXINE HCL ER 75 MG PO CP24
75.0000 mg | ORAL_CAPSULE | Freq: Every day | ORAL | Status: DC
Start: 2021-06-03 — End: 2021-06-07
  Administered 2021-06-03 – 2021-06-05 (×3): 75 mg via ORAL
  Filled 2021-06-02 (×5): qty 1

## 2021-06-02 MED ORDER — ONDANSETRON HCL 4 MG/2ML IJ SOLN
4.0000 mg | Freq: Four times a day (QID) | INTRAMUSCULAR | Status: DC | PRN
  Administered 2021-06-04: 4 mg via INTRAVENOUS
  Filled 2021-06-02: qty 2

## 2021-06-02 MED ORDER — VANCOMYCIN HCL IN DEXTROSE 1-5 GM/200ML-% IV SOLN
1000.0000 mg | Freq: Once | INTRAVENOUS | Status: AC
  Administered 2021-06-02: 01:00:00 1000 mg via INTRAVENOUS
  Filled 2021-06-02: qty 200

## 2021-06-02 MED ORDER — CEFTRIAXONE SODIUM 1 G IJ SOLR
1.0000 g | Freq: Once | INTRAMUSCULAR | Status: DC
  Filled 2021-06-02: qty 10

## 2021-06-02 MED ORDER — IOHEXOL 350 MG/ML SOLN
75.0000 mL | Freq: Once | INTRAVENOUS | Status: AC | PRN
  Administered 2021-06-02: 02:00:00 75 mL via INTRAVENOUS

## 2021-06-02 MED ORDER — NITROGLYCERIN 2 % TD OINT
0.5000 [in_us] | TOPICAL_OINTMENT | Freq: Once | TRANSDERMAL | Status: AC
  Administered 2021-06-02: 01:00:00 0.5 [in_us] via TOPICAL
  Filled 2021-06-02: qty 1

## 2021-06-02 MED ORDER — SODIUM CHLORIDE 0.9 % IV BOLUS (SEPSIS)
1000.0000 mL | Freq: Once | INTRAVENOUS | Status: AC
  Administered 2021-06-02: 01:00:00 1000 mL via INTRAVENOUS

## 2021-06-02 MED ORDER — ALBUTEROL SULFATE (2.5 MG/3ML) 0.083% IN NEBU
2.5000 mg | INHALATION_SOLUTION | Freq: Four times a day (QID) | RESPIRATORY_TRACT | Status: DC | PRN
  Administered 2021-06-08: 2.5 mg via RESPIRATORY_TRACT
  Filled 2021-06-02: qty 3

## 2021-06-02 MED ORDER — PANTOPRAZOLE SODIUM 40 MG PO TBEC: 40.0000 mg | DELAYED_RELEASE_TABLET | Freq: Every day | ORAL | Status: DC

## 2021-06-02 MED ORDER — HEPARIN (PORCINE) 25000 UT/250ML-% IV SOLN
1000.0000 [IU]/h | INTRAVENOUS | Status: DC
  Administered 2021-06-02: 04:00:00 850 [IU]/h via INTRAVENOUS
  Filled 2021-06-02: qty 250

## 2021-06-02 MED ORDER — SENNOSIDES-DOCUSATE SODIUM 8.6-50 MG PO TABS
1.0000 | ORAL_TABLET | Freq: Two times a day (BID) | ORAL | Status: DC
  Administered 2021-06-02 – 2021-06-03 (×3): 1 via ORAL
  Filled 2021-06-02 (×4): qty 1

## 2021-06-02 MED ORDER — ACETAMINOPHEN 650 MG RE SUPP: 650.0000 mg | Freq: Four times a day (QID) | RECTAL | Status: DC | PRN

## 2021-06-02 MED ORDER — MIRTAZAPINE 15 MG PO TABS
45.0000 mg | ORAL_TABLET | Freq: Every day | ORAL | Status: DC
  Administered 2021-06-02 – 2021-06-06 (×5): 45 mg via ORAL
  Filled 2021-06-02 (×5): qty 3

## 2021-06-02 MED ORDER — SODIUM CHLORIDE 0.9 % IV BOLUS (SEPSIS)
500.0000 mL | Freq: Once | INTRAVENOUS | Status: AC
  Administered 2021-06-02: 01:00:00 500 mL via INTRAVENOUS

## 2021-06-02 MED ORDER — VANCOMYCIN HCL 1250 MG/250ML IV SOLN
1250.0000 mg | INTRAVENOUS | Status: DC
  Administered 2021-06-03: 1250 mg via INTRAVENOUS
  Filled 2021-06-02 (×2): qty 250

## 2021-06-02 MED ORDER — POTASSIUM CHLORIDE CRYS ER 20 MEQ PO TBCR
40.0000 meq | EXTENDED_RELEASE_TABLET | Freq: Once | ORAL | Status: AC
  Administered 2021-06-02: 01:00:00 40 meq via ORAL
  Filled 2021-06-02: qty 2

## 2021-06-02 MED ORDER — ACETAMINOPHEN 325 MG PO TABS
650.0000 mg | ORAL_TABLET | Freq: Four times a day (QID) | ORAL | Status: DC | PRN
Start: 2021-06-02 — End: 2021-06-14
  Administered 2021-06-02 – 2021-06-09 (×12): 650 mg via ORAL
  Filled 2021-06-02 (×14): qty 2

## 2021-06-02 MED ORDER — ONDANSETRON HCL 4 MG PO TABS
4.0000 mg | ORAL_TABLET | Freq: Four times a day (QID) | ORAL | Status: DC | PRN
  Administered 2021-06-05 – 2021-06-12 (×2): 4 mg via ORAL
  Filled 2021-06-02 (×2): qty 1

## 2021-06-02 MED ORDER — CYANOCOBALAMIN 1000 MCG/ML IJ SOLN
1000.0000 ug | Freq: Every day | INTRAMUSCULAR | Status: AC
  Administered 2021-06-03 – 2021-06-05 (×3): 1000 ug via INTRAMUSCULAR
  Filled 2021-06-02 (×5): qty 1

## 2021-06-02 NOTE — Assessment & Plan Note (Addendum)
Status post thoracentesis with removing 1.2 L.  Lab study showed 79% neutrophils, Gram stain and cultures pending.  Glucose high, protein low.  LDH 61.  Appears to be transudate. Culture negative.

## 2021-06-02 NOTE — TOC Initial Note (Addendum)
Transition of Care (TOC) - Initial/Assessment Note  ? ? ?Patient Details  ?Name: Jenna Dudley ?MRN: [id number] ?Date of Birth: 1935-05-22 ? ?Transition of Care (TOC) CM/SW Contact:    ?Conception Oms, RN ?Phone Number: ?06/02/2021, 9:09 AM ? ?Clinical Narrative:        Patient lives at home with her spouse Carloyn Manner [phone],    Has a PCP and Cardiologist, Gets meds from [facility], No obvious TOC needs at this time, Active with [facility] ? ?Transition of Care (TOC) Screening Note ? ? ?Patient Details  ?Name: Jenna Dudley ?Date of Birth: Mar 18, 1936 ? ? ?Transition of Care (TOC) CM/SW Contact:    ?Conception Oms, RN ?Phone Number: ?06/02/2021, 9:11 AM ? ? ? ?Transition of Care Department [facility]) has reviewed patient and no TOC needs have been identified at this time. We will continue to monitor patient advancement through interdisciplinary progression rounds. If new patient transition needs arise, please place a TOC consult. ?       ? ? ?  ?  ? ? ?Patient Goals and CMS Choice ?  ?  ?  ? ?Expected Discharge Plan and Services ?  ?  ?  ?  ?  ?                ?  ?  ?  ?  ?  ?  ?  ?  ?  ?  ? ?Prior Living Arrangements/Services ?  ?  ?  ?       ?  ?  ?  ?  ? ?Activities of Daily Living ?  ?  ? ?Permission Sought/Granted ?  ?  ?   ?   ?   ?   ? ?Emotional Assessment ?  ?  ?  ?  ?  ?  ? ?Admission diagnosis:  Necrotizing pneumonia ([place]) [J85.0] ?Hypokalemia [E87.6] ?Osteoarthrosis [M19.90] ?Pneumonia [J18.9] ?Pleural effusion [J90] ?Elevated troponin [R77.8] ?Other acute pulmonary embolism without acute cor pulmonale ([place]) [I26.99] ?Chest pain, unspecified type [R07.9] ?Community acquired pneumonia of right lung, unspecified part of lung [J18.9] ?Sepsis, due to unspecified organism, unspecified whether acute organ dysfunction present ([place]) [A41.9] ?Patient Active Problem List  ? Diagnosis Date Noted  ? Pneumonia 06/02/2021  ? Pulmonary emboli ([place]) 06/02/2021  ? Elevated troponin 06/02/2021  ? Chest pain 06/02/2021  ?  Necrotizing pneumonia ([place])   ? Sepsis ([place])   ? COVID-19 virus infection 04/11/2021  ? Right foot infection 04/10/2021  ? Elevated lactic acid level 04/10/2021  ? Hypokalemia 04/10/2021  ? Chronic diastolic CHF (congestive heart failure) ([place]) 04/10/2021  ? CKD (chronic kidney disease), stage IIIa 04/10/2021  ? Iron deficiency anemia 04/10/2021  ? CHF exacerbation ([place]) 12/10/2020  ? Dementia ([place])   ? HCAP (healthcare-associated pneumonia) 12/18/2017  ? Bipolar 1 disorder ([place]) 05/29/2017  ? CHF (congestive heart failure) ([place]) 04/23/2017  ? Accelerated hypertension 04/21/2017  ? Severe sepsis ([place]) 10/03/2015  ? Hypernatremia 10/03/2015  ? Lactic acidosis 10/03/2015  ? Leukocytosis 10/03/2015  ? Agitation 10/03/2015  ? Severe recurrent major depression without psychotic features ([place])   ? Major depressive disorder, recurrent episode, severe, with psychotic behavior ([place]) 10/13/2014  ? Severe episode of recurrent major depressive disorder, with psychotic features ([place]) 10/13/2014  ? Major depressive disorder, 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 features ([place]) 10/06/2014  ? Essential hypertension 09/17/2014  ? Chronic back pain 09/17/2014  ? Gastric reflux 09/17/2014  ? History of stroke 09/17/2014  ? Severe recurrent major  depression with psychotic features ([place])   ? Acute on chronic diastolic CHF (congestive heart failure) ([place]) 07/29/2014  ? Partial small bowel obstruction ([place]) 07/29/2014  ? Hallux abductovalgus with bunions 09/12/2013  ? Hammer toe 09/12/2013  ? Foot pain 09/12/2013  ? Fungal infection of nail 09/12/2013  ? Carpal tunnel syndrome 12/21/2010  ? Chronic low back pain 12/21/2010  ? Degenerative arthritis of lumbar spine 12/21/2010  ? Depression 12/21/2010  ? 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 HLD (hyperlipidemia) 12/21/2010  ? Acid reflux 12/21/2010  ? Spinal stenosis 12/21/2010  ? ?PCP:  Aderoju, Jadene Pierini, MD ?Pharmacy:   ?[facility] [address] ?[address]. ?[place] [address] ?Phone: [phone] Fax: [phone] ? ? ? ? ?Social Determinants of Health (SDOH) Interventions ?  ? ?Readmission Risk Interventions ?No flowsheet data found. ? ? ?

## 2021-06-02 NOTE — Progress Notes (Signed)
Pharmacy Antibiotic Note ? ?Jenna Dudley is a 86 y.o. female admitted on 06/01/2021 with aspiration pneumonia & cellulitis.  Pharmacy has been consulted for Vancomycin dosing x 7 days and Unasyn. ? ?Plan: ?Unasyn 3 gm q8h x 5 days per indication and renal fxn. ? ?Vancomycin ?Pt given initial dose of Vancomycin 1 gm in ED. ?Vancomycin 1250 mg IV Q 36 hrs.  ?Goal AUC 400-550. ?Expected AUC: 505.5 ?SCr used: 0.88 ? ?Pharmacy will continue to follow and will adjust abx dosing when warranted. ? ? ?Height: 5' (152.4 cm) ?Weight: 51.6 kg (113 lb 12.1 oz) ?IBW/kg (Calculated) : 45.5 ? ?Temp (24hrs), Avg:98.4 ?F (36.9 ?C), Min:98.4 ?F (36.9 ?C), Max:98.4 ?F (36.9 ?C) ? ?Recent Labs  ?Lab 06/01/21 ?2357 06/02/21 ?0257  ?WBC 11.1*  --   ?CREATININE 0.88  --   ?LATICACIDVEN 4.0* 1.9  ?  ?Estimated Creatinine Clearance: 34.8 mL/min (by C-G formula based on SCr of 0.88 mg/dL).   ? ?Allergies  ?Allergen Reactions  ? Aspirin Other (See Comments)  ?  Unknown reaction  ? Sulfa Antibiotics Other (See Comments)  ?  Unknown reaction  ? ? ?Antimicrobials this admission: ?3/9 Azithromycin >> x 1 dose ?3/9 Ceftriaxone >> x 1 dose ?3/9 Unasyn >> x 5 days ?3/9 Vancomycin >> x 7 days ? ?Microbiology results: ?3/8 BCx: Pending ?3/8 UCx: Pending  ?3/8 Sputum: Pending  ? ?Thank you for allowing pharmacy to be a part of this patient?s care. ? ?Otelia Sergeant, PharmD, MBA ?06/02/2021 ?5:46 AM ? ? ?

## 2021-06-02 NOTE — Progress Notes (Addendum)
?Progress Note ? ? ?Patient: Jenna Dudley:[id number] DOB: July 14, 1935 DOA: 06/01/2021     0 ?DOS: the patient was seen and examined on 06/02/2021 ?  ?Brief hospital course: ?Jenna Dudley is a pleasant 86 y.o. female with medical history significant for chronic diastolic CHF, GERD, bipolar disorder, history of CVA, and right third toe wound, now presenting to the emergency department for evaluation of chest pain. ?In the hospital, she had heart rate of 114, resp irate 28, lactic acid 4.0.  CTA chest showed segmental PEs, right side opacity with a fluid level.  Also showed additional groundglass opacities, thickened esophagus wall.  And moderate to large right-sided pleural effusion.  Patient was given IV fluids, lactic acid level has dropped down to normal.  She is also started on antibiotics with Unasyn and vancomycin. ? ?Assessment and Plan: ?* Pneumonia ?CT scan is concerning for pulm abscess.  Will obtain pulmonology consult. ?There is a possibility of aspiration pneumonia from acid reflux.  Obtain speech therapy evaluation. ?Continue Unasyn. ? ?Anemia of chronic disease ?Patient hemoglobin dropped down to 7.5, no evidence of active bleeding.  We will continue to follow closely. ?Iron level was normal, patient was chronically taking B12.  Recheck B12 level, may need a B12 injection if B12 level still low. ? ?Pleural effusion on right ?Patient also has moderate to severe large right-sided pleural effusion.  It appears to be secondary to congestive heart failure, however, will need to rule out infection.  Obtain thoracentesis ? ?Hypomagnesemia ?We will give magnesium sulfate. ? ?Chest pain ?Secondary to PE. ? ?Elevated troponin ?Peak troponin was 108, dropped down to 94.  This is secondary to sepsis. ? ?Pulmonary emboli ([place]) ?Patient currently treated with heparin drip.  We will continue for now. ? ?Chronic diastolic CHF (congestive heart failure) ([place]) ?Acute congestive heart failure ruled out. ?Patient does not  seem to have any volume overload.  Continue to follow. ? ?Hypokalemia ?Potassium repleted.  Recheck a BMP tomorrow. ? ?Right foot infection ?Patient will be evaluated by podiatry again today.  X-ray did not show any evidence of osteomyelitis.  I will continue vancomycin until seen by podiatry. ? ?Bipolar 1 disorder ([place]) ?Resume home medicines. ? ?Severe sepsis ([place]) ?Patient met severe sepsis criteria with tachycardia and tachypnea.  Lactic acid level of 4.0.  This is secondary to pneumonia.  Patient currently is hemodynamically stable, will treat underlying diseases. ? ?History of stroke ?Continue to follow. ? ? ?Addendum: 1700. ?Reviewed labs, hemoglobin dropped down to 7.1, discussed with patient husband, will transfuse 1 unit PRBC.  I will discontinue heparin for now until we are sure that patient has no bleeding. ?  ? ?Subjective:  ?Patient has some confusion, she does not feel short of breath.  She still complaining some chest pain, with deep breath. ?No abdominal pain nausea vomiting. ? ?Physical Exam: ?Vitals:  ? 06/02/21 0335 06/02/21 0400 06/02/21 0544 06/02/21 0748  ?BP: (!) 123/47 (!) 123/47 (!) 131/58 123/66  ?Pulse: (!) 104 92 95 85  ?Resp: 14 (!) '24 20 16  ' ?Temp:   97.7 ?F (36.5 ?C) 98.4 ?F (36.9 ?C)  ?TempSrc:      ?SpO2: 99% 100% 96% 100%  ?Weight:      ?Height:      ? ?General exam: Appears calm and comfortable  ?Respiratory system: Decreased breathing sounds on the right.  Respiratory effort normal. ?Cardiovascular system: S1 & S2 heard, RRR. No JVD, murmurs, rubs, gallops or clicks. No pedal edema. ?Gastrointestinal system:  Abdomen is nondistended, soft and nontender. No organomegaly or masses felt. Normal bowel sounds heard. ?Central nervous system: Alert and oriented x2. No focal neurological deficits. ?Extremities: Symmetric 5 x 5 power. ?Skin: No rashes, lesions or ulcers ?Psychiatry: Judgement and insight appear normal. Mood & affect appropriate.  ? ?Data Reviewed: ?Reviewed all imaging  studies and lab results. ? ?Family Communication: husband updated ? ?Disposition: ?Status is: Inpatient ?Remains inpatient appropriate because: Severity of disease, IV antibiotics, procedures. ? Planned Discharge Destination: Skilled nursing facility ? ? ? ?Time spent: 34 minutes, no charge filed.  ? ?Author: ?Sharen Hones, MD ?06/02/2021 12:07 PM ? ?For on call review [email or web address].  ?

## 2021-06-02 NOTE — Consult Note (Signed)
PULMONOLOGY         Date: 06/02/2021,   MRN# [id number] Jenna Dudley 1935-04-08     AdmissionWeight: 51.6 kg                 CurrentWeight: 51.6 kg   Referring physician: Dr Chipper Herb   CHIEF COMPLAINT:   Abnormal CT chest imaging with pleural effusion status postthoracentesis   HISTORY OF PRESENT ILLNESS   This is a pleasant 86 year old female with a history of esophageal spasms and GERD, chronic depression, heart failure with preserved ejection fraction, bipolar disorder, right foot wounds, initially came to the emergency department with chest pain.  She reports chest pain is only been for 1 day.  She was found to have mild hypoxemia with SPO2 in the 90s on room air blood work showed anemia which is chronic and macrocytic as well as mild hypokalemia with a potassium of 3.1 she did have lactic acidosis with mild troponin elevation. She had CTPE done with bilateral PE noted. There is additional consolidated area of right lung which appears to be infiltrate consistent with pneumonia. She has not smoked in >40 years and does not have a history of cancer personally not family history of cancer. Fluid profile is PMN predominant thus far with additional studies pending. PCCM consultation for further evaluation and management.    PAST MEDICAL HISTORY   Past Medical History:  Diagnosis Date   Bilateral carpal tunnel syndrome 07/27/2014   Bipolar disorder (HCC)    geropsych admission to [facility] in Dec 2018   Chronic diastolic CHF (congestive heart failure) (HCC)    a. 03/2017 Echo: EF 55-60%, no rwma, Gr1 DD, mild MR; b. 06/2019 Echo: EF 55-60%, no rwma, mod LVH, GrII DD. Nl RV size/fxn. RVSP 54.61mmHg. Mildly dil LA.   Chronic low back pain 07/29/2014   Degenerative arthritis of lumbar spine 07/27/2014   Dementia (HCC)    Depression    Esophageal spasm 07/29/2014   Gastric reflux 09/17/2014   GERD (gastroesophageal reflux disease)    History of stress test    a. 04/2017 MV:  low risk stress test.  EF 55-65%. Probable apical ant/apical, basal, and mid inflat attenuation artifact vs ischemia/scar.   Hyperlipemia 07/29/2014   Hypertension    Pneumonia    Spinal stenosis 07/29/2014     SURGICAL HISTORY   Past Surgical History:  Procedure Laterality Date   HIP SURGERY Right    INNER EAR SURGERY Right    REPLACEMENT TOTAL KNEE BILATERAL Bilateral 07/27/14     FAMILY HISTORY   Family History  Problem Relation Age of Onset   Hypertension Mother    Stroke Mother      SOCIAL HISTORY   Social History   Tobacco Use   Smoking status: Former    Types: Cigarettes    Quit date: 07/29/1975    Years since quitting: 45.8   Smokeless tobacco: Never  Vaping Use   Vaping Use: Never used  Substance Use Topics   Alcohol use: No    Alcohol/week: 0.0 standard drinks   Drug use: No     MEDICATIONS    Home Medication:    Current Medication:  Current Facility-Administered Medications:    acetaminophen (TYLENOL) tablet 650 mg, 650 mg, Oral, Q6H PRN **OR** acetaminophen (TYLENOL) suppository 650 mg, 650 mg, Rectal, Q6H PRN, Opyd, Timothy S, MD   albuterol (PROVENTIL) (2.5 MG/3ML) 0.083% nebulizer solution 2.5 mg, 2.5 mg, Nebulization, Q6H PRN, Marrion Coy, MD  Ampicillin-Sulbactam (UNASYN) 3 g in sodium chloride 0.9 % 100 mL IVPB, 3 g, Intravenous, Q8H, Otelia Sergeant, RPH, Stopped at 06/02/21 1308   furosemide (LASIX) tablet 40 mg, 40 mg, Oral, Daily, Marrion Coy, MD   haloperidol lactate (HALDOL) injection 2 mg, 2 mg, Intravenous, Q6H PRN, Marrion Coy, MD   heparin ADULT infusion 100 units/mL (25000 units/259mL), 1,000 Units/hr, Intravenous, Continuous, Marrion Coy, MD, Last Rate: 10 mL/hr at 06/02/21 1346, 1,000 Units/hr at 06/02/21 1346   MEDLINE mouth rinse, 15 mL, Mouth Rinse, BID, Marrion Coy, MD   metoprolol tartrate (LOPRESSOR) tablet 25 mg, 25 mg, Oral, BID, Marrion Coy, MD   mirtazapine (REMERON) tablet 45 mg, 45 mg, Oral, QHS, Zhang, Dekui,  MD   ondansetron (ZOFRAN) tablet 4 mg, 4 mg, Oral, Q6H PRN **OR** ondansetron (ZOFRAN) injection 4 mg, 4 mg, Intravenous, Q6H PRN, Opyd, Lavone Neri, MD   pantoprazole (PROTONIX) EC tablet 40 mg, 40 mg, Oral, Daily, Chipper Herb, Dekui, MD   polyethylene glycol (MIRALAX / GLYCOLAX) packet 17 g, 17 g, Oral, Daily PRN, Marrion Coy, MD   senna-docusate (Senokot-S) tablet 1 tablet, 1 tablet, Oral, BID, Marrion Coy, MD   simvastatin (ZOCOR) tablet 40 mg, 40 mg, Oral, QHS, Zhang, Dekui, MD   sodium chloride flush (NS) 0.9 % injection 3 mL, 3 mL, Intravenous, Q12H, Opyd, Lavone Neri, MD, 3 mL at 06/02/21 0846   [START ON 06/03/2021] vancomycin (VANCOREADY) IVPB 1250 mg/250 mL, 1,250 mg, Intravenous, Q36H, Otelia Sergeant, RPH   [START ON 06/03/2021] venlafaxine XR (EFFEXOR-XR) 24 hr capsule 75 mg, 75 mg, Oral, Q breakfast, Marrion Coy, MD   vitamin B-12 (CYANOCOBALAMIN) tablet 500 mcg, 500 mcg, Oral, Daily, Marrion Coy, MD  Facility-Administered Medications Ordered in Other Encounters:    0.9 %  sodium chloride infusion, , , Continuous PRN, Lily Kocher, CRNA, Continued from Pre-op at 08/07/18 1110   lactated ringers infusion, , Intravenous, Continuous, Clapacs, Jackquline Denmark, MD, New Bag at 07/09/19 1014    ALLERGIES   Aspirin and Sulfa antibiotics     REVIEW OF SYSTEMS    Review of Systems:  Gen:  Denies  fever, sweats, chills weigh loss  HEENT: Denies blurred vision, double vision, ear pain, eye pain, hearing loss, nose bleeds, sore throat Cardiac:  No dizziness, chest pain or heaviness, chest tightness,edema Resp:   Denies cough or sputum porduction, shortness of breath,wheezing, hemoptysis,  Gi: Denies swallowing difficulty, stomach pain, nausea or vomiting, diarrhea, constipation, bowel incontinence Gu:  Denies bladder incontinence, burning urine Ext:   Denies Joint pain, stiffness or swelling Skin: Denies  skin rash, easy bruising or bleeding or hives Endoc:  Denies polyuria, polydipsia ,  polyphagia or weight change Psych:   Denies depression, insomnia or hallucinations   Other:  All other systems negative   VS: BP 131/63 (BP Location: Left Arm)    Pulse 87    Temp 98 F (36.7 C)    Resp 16    Ht 5' (1.524 m)    Wt 51.6 kg    LMP  (LMP Unknown)    SpO2 100%    BMI 22.22 kg/m      PHYSICAL EXAM    GENERAL:NAD, no fevers, chills, no weakness no fatigue HEAD: Normocephalic, atraumatic.  EYES: Pupils equal, round, reactive to light. Extraocular muscles intact. No scleral icterus.  MOUTH: Moist mucosal membrane. Dentition intact. No abscess noted.  EAR, NOSE, THROAT: Clear without exudates. No external lesions.  NECK: Supple. No thyromegaly. No nodules. No JVD.  PULMONARY: mild rhonchi bilaterally  CARDIOVASCULAR: S1 and S2. Regular rate and rhythm. No murmurs, rubs, or gallops. No edema. Pedal pulses 2+ bilaterally.  GASTROINTESTINAL: Soft, nontender, nondistended. No masses. Positive bowel sounds. No hepatosplenomegaly.  MUSCULOSKELETAL: No swelling, clubbing, or edema. Range of motion full in all extremities.  NEUROLOGIC: Cranial nerves II through XII are intact. No gross focal neurological deficits. Sensation intact. Reflexes intact.  SKIN: No ulceration, lesions, rashes, or cyanosis. Skin warm and dry. Turgor intact.  PSYCHIATRIC: Mood, affect within normal limits. The patient is awake, alert and oriented x 3. Insight, judgment intact.       IMAGING    CT Angio Chest PE W/Cm &/Or Wo Cm  Result Date: 06/02/2021 CLINICAL DATA:  Chest and abdomen pain. EXAM: CT ANGIOGRAPHY CHEST CT ABDOMEN AND PELVIS WITH CONTRAST TECHNIQUE: Multidetector CT imaging of the chest was performed using the standard protocol during bolus administration of intravenous contrast. Multiplanar CT image reconstructions and MIPs were obtained to evaluate the vascular anatomy. Multidetector CT imaging of the abdomen and pelvis was performed using the standard protocol during bolus administration  of intravenous contrast. RADIATION DOSE REDUCTION: This exam was performed according to the departmental dose-optimization program which includes automated exposure control, adjustment of the mA and/or kV according to patient size and/or use of iterative reconstruction technique. CONTRAST:  75mL OMNIPAQUE IOHEXOL 350 MG/ML SOLN COMPARISON:  Abdomen and pelvis CT with contrast 12/11/2020, CT chest, abdomen pelvis with contrast 12/20/2017, CTA chest 04/17/2017 FINDINGS: CTA CHEST FINDINGS Cardiovascular: Pulmonary arteries are upper limit of normal in caliber. Nonoccluding linear thrombotic filling defect is seen in right upper lobe segmental and 1 or possibly 2 subsegmental upstream arteries to the apical area and is also seen in 3 right lower lobe segmental and several downstream subsegmental arteries. Overall clot burden is small without findings of acute right heart strain. There is cardiomegaly with left chamber predominance, three-vessel calcific CAD, and thinning and fatty infiltration in the apical myocardium again noted consistent with a prior apical infarct. No pericardial effusion is seen. No other pulmonary embolus is seen. Superior pulmonary veins are slightly prominent but no more than previously. There are calcifications of the mitral ring, and heavy aortic calcific atherosclerosis,without aortic aneurysm, penetrating ulcer or dissection. There is atherosclerosis in the great vessels without flow-limiting stenosis. Mediastinum/Nodes: Axillary spaces are clear. The thyroid gland is unremarkable. There is interval increased diffuse thickening and patulous Ness of the esophagus. Small hiatal hernia. Findings may suggest diffuse esophagitis. Endoscopic follow-up may be indicated. There are increasingly mildly enlarged lymph nodes in the AP window, precarinal and subcarinal mediastinum, and mildly prominent lymph nodes in the lower right hilum. No tracheal lesion or fluid is seen. The central airways are  clear and normal caliber. There is no bulky or encasing adenopathy. Lungs/Pleura: A moderate-to-large right pleural effusion is again demonstrated, similar to 12/11/2020 but was not seen in 2019. There is a small layering left pleural effusion which is new from both prior studies. Increased subpleural interstitial markings in the lung bases and apices are noted probably due to interstitial edema. In the right lower lobe anterior basal area there is fluid and debris in the area segmental bronchi and multiple downstream small bronchi. A confluent opacity extending from the hilum to the anterolateral pleural surface has developed in this segment measuring 6.3 by 3.0 cm on series 3 axial 79 with only scant air bronchograms only in its medial aspect. The area of consolidation contains an air-fluid level measuring 1.6 x  1.3 cm on axial 75 which is worrisome for a small pulmonary abscess with possibly necrotizing pneumonic process. Underlying mass is difficult to exclude but should not have reached this size in 6 months. There are additional scattered ground-glass opacities in the more central lungs bilaterally and in the peripheral partially aerated right lower lobe, which could be ground-glass edema, pneumonitis or combination. There is compressive atelectasis along side the right pleural effusion. Musculoskeletal: There is dextroscoliosis and multilevel bridging enthesopathy of the thoracic spine. No acute or significant osseous findings. Review of the MIP images confirms the above findings. CT ABDOMEN and PELVIS FINDINGS Hepatobiliary: As previously the liver enhancement is heterogeneous initially but homogeneous on delayed phase compatible with perfusional differences. There is no mass enhancement. Post cholecystectomy intrahepatic and extrahepatic biliary dilatation is seen with common bile duct 1.8 cm, previously 1.6 cm. There is no appreciable ductal stone no increased prominence in the pre-existing intrahepatic  biliary dilatation. Pancreas: Atrophic without appreciable mass or ductal dilatation. Spleen: Normal in size and enhancement. Adrenals/Urinary Tract: There is no adrenal mass. There is scattered cortical scarring of the kidneys without visible cortical mass. There is no evidence of urinary stone or obstruction. No focal bladder abnormality. Stomach/Bowel: Small hiatal hernia. Chronic thickened folds in the stomach unchanged. No bowel obstruction or inflammation. An appendix is not seen. There is mild fecal stasis, without evidence of colitis/diverticulitis. Vascular/Lymphatic: Heavy extensive calcification in the aorta, iliac and visceral branch arteries is again noted. There is no AAA. Major branch vessels appear patent. The portal and splenic veins are patent and normal caliber. There is no adenopathy. Reproductive: Surgically absent uterus.  No adnexal mass. Other: There is body wall anasarca. There is trace ascites in the pelvis. Musculoskeletal: There is advanced degenerative change of the lumbar spine and chronic wedging of the T12 and L1 vertebrae, multilevel acquired spinal canal and foraminal stenosis lumbar spine. No acute regional skeletal findings. Mild-to-moderate hip DJD. Review of the MIP images confirms the above findings. IMPRESSION: 1. Nonoccluding linear thrombus in segmental and subsegmental arteries in the right upper and lower lobes, overall small clot burden common, no findings of acute right heart strain. 2. 6.3 x 3.0 cm confluent opacity from the hilum to the pleural surface, anterior basal segment right lower lobe. There are mildly enlarged mediastinal and hilar lymph nodes which could be reactive but are nonspecific. 3. Underlying mass difficult to exclude but this is more likely a necrotizing pneumonic process and contains a small 1.6 cm air-fluid level which is probably an abscess. There is mucoid debris or fluid opacifying segmental and downstream subsegmental bronchi in the area. 4.  Cardiomegaly, calcific CAD, aortic atherosclerosis and chronic apical left ventricular infarct. 5. Evidence of at least mild interstitial edema, moderate-to-large left right pleural effusion which was seen on 12/11/2020, and new small left pleural effusion with increased body wall edema. 6. Additional ground-glass opacities which could indicate additional edema and or pneumonitis. 7. Again noted intrahepatic and extrahepatic postcholecystectomy biliary dilatation. It is not significantly changed since 12/11/2020 but has increased since 2019. Laboratory and clinical correlation advised. MRCP or ERCP may be helpful if warranted. 8. Diffuse increased esophageal thickening consistent with esophagitis with increased patulous esophagus appearance. Small hiatal hernia. Consider endoscopic follow-up if warranted. 9. Minimal pelvic ascites and remaining findings discussed above. 10. Discussed over the phone with Dr. Dolores Frame at 3:50 a.m., 06/02/2021. Electronically Signed   By: Almira Bar M.D.   On: 06/02/2021 03:51   CT Abdomen Pelvis W  Contrast  Result Date: 06/02/2021 CLINICAL DATA:  Chest and abdomen pain. EXAM: CT ANGIOGRAPHY CHEST CT ABDOMEN AND PELVIS WITH CONTRAST TECHNIQUE: Multidetector CT imaging of the chest was performed using the standard protocol during bolus administration of intravenous contrast. Multiplanar CT image reconstructions and MIPs were obtained to evaluate the vascular anatomy. Multidetector CT imaging of the abdomen and pelvis was performed using the standard protocol during bolus administration of intravenous contrast. RADIATION DOSE REDUCTION: This exam was performed according to the departmental dose-optimization program which includes automated exposure control, adjustment of the mA and/or kV according to patient size and/or use of iterative reconstruction technique. CONTRAST:  75mL OMNIPAQUE IOHEXOL 350 MG/ML SOLN COMPARISON:  Abdomen and pelvis CT with contrast 12/11/2020, CT chest,  abdomen pelvis with contrast 12/20/2017, CTA chest 04/17/2017 FINDINGS: CTA CHEST FINDINGS Cardiovascular: Pulmonary arteries are upper limit of normal in caliber. Nonoccluding linear thrombotic filling defect is seen in right upper lobe segmental and 1 or possibly 2 subsegmental upstream arteries to the apical area and is also seen in 3 right lower lobe segmental and several downstream subsegmental arteries. Overall clot burden is small without findings of acute right heart strain. There is cardiomegaly with left chamber predominance, three-vessel calcific CAD, and thinning and fatty infiltration in the apical myocardium again noted consistent with a prior apical infarct. No pericardial effusion is seen. No other pulmonary embolus is seen. Superior pulmonary veins are slightly prominent but no more than previously. There are calcifications of the mitral ring, and heavy aortic calcific atherosclerosis,without aortic aneurysm, penetrating ulcer or dissection. There is atherosclerosis in the great vessels without flow-limiting stenosis. Mediastinum/Nodes: Axillary spaces are clear. The thyroid gland is unremarkable. There is interval increased diffuse thickening and patulous Ness of the esophagus. Small hiatal hernia. Findings may suggest diffuse esophagitis. Endoscopic follow-up may be indicated. There are increasingly mildly enlarged lymph nodes in the AP window, precarinal and subcarinal mediastinum, and mildly prominent lymph nodes in the lower right hilum. No tracheal lesion or fluid is seen. The central airways are clear and normal caliber. There is no bulky or encasing adenopathy. Lungs/Pleura: A moderate-to-large right pleural effusion is again demonstrated, similar to 12/11/2020 but was not seen in 2019. There is a small layering left pleural effusion which is new from both prior studies. Increased subpleural interstitial markings in the lung bases and apices are noted probably due to interstitial edema. In  the right lower lobe anterior basal area there is fluid and debris in the area segmental bronchi and multiple downstream small bronchi. A confluent opacity extending from the hilum to the anterolateral pleural surface has developed in this segment measuring 6.3 by 3.0 cm on series 3 axial 79 with only scant air bronchograms only in its medial aspect. The area of consolidation contains an air-fluid level measuring 1.6 x 1.3 cm on axial 75 which is worrisome for a small pulmonary abscess with possibly necrotizing pneumonic process. Underlying mass is difficult to exclude but should not have reached this size in 6 months. There are additional scattered ground-glass opacities in the more central lungs bilaterally and in the peripheral partially aerated right lower lobe, which could be ground-glass edema, pneumonitis or combination. There is compressive atelectasis along side the right pleural effusion. Musculoskeletal: There is dextroscoliosis and multilevel bridging enthesopathy of the thoracic spine. No acute or significant osseous findings. Review of the MIP images confirms the above findings. CT ABDOMEN and PELVIS FINDINGS Hepatobiliary: As previously the liver enhancement is heterogeneous initially but homogeneous on delayed  phase compatible with perfusional differences. There is no mass enhancement. Post cholecystectomy intrahepatic and extrahepatic biliary dilatation is seen with common bile duct 1.8 cm, previously 1.6 cm. There is no appreciable ductal stone no increased prominence in the pre-existing intrahepatic biliary dilatation. Pancreas: Atrophic without appreciable mass or ductal dilatation. Spleen: Normal in size and enhancement. Adrenals/Urinary Tract: There is no adrenal mass. There is scattered cortical scarring of the kidneys without visible cortical mass. There is no evidence of urinary stone or obstruction. No focal bladder abnormality. Stomach/Bowel: Small hiatal hernia. Chronic thickened folds in  the stomach unchanged. No bowel obstruction or inflammation. An appendix is not seen. There is mild fecal stasis, without evidence of colitis/diverticulitis. Vascular/Lymphatic: Heavy extensive calcification in the aorta, iliac and visceral branch arteries is again noted. There is no AAA. Major branch vessels appear patent. The portal and splenic veins are patent and normal caliber. There is no adenopathy. Reproductive: Surgically absent uterus.  No adnexal mass. Other: There is body wall anasarca. There is trace ascites in the pelvis. Musculoskeletal: There is advanced degenerative change of the lumbar spine and chronic wedging of the T12 and L1 vertebrae, multilevel acquired spinal canal and foraminal stenosis lumbar spine. No acute regional skeletal findings. Mild-to-moderate hip DJD. Review of the MIP images confirms the above findings. IMPRESSION: 1. Nonoccluding linear thrombus in segmental and subsegmental arteries in the right upper and lower lobes, overall small clot burden common, no findings of acute right heart strain. 2. 6.3 x 3.0 cm confluent opacity from the hilum to the pleural surface, anterior basal segment right lower lobe. There are mildly enlarged mediastinal and hilar lymph nodes which could be reactive but are nonspecific. 3. Underlying mass difficult to exclude but this is more likely a necrotizing pneumonic process and contains a small 1.6 cm air-fluid level which is probably an abscess. There is mucoid debris or fluid opacifying segmental and downstream subsegmental bronchi in the area. 4. Cardiomegaly, calcific CAD, aortic atherosclerosis and chronic apical left ventricular infarct. 5. Evidence of at least mild interstitial edema, moderate-to-large left right pleural effusion which was seen on 12/11/2020, and new small left pleural effusion with increased body wall edema. 6. Additional ground-glass opacities which could indicate additional edema and or pneumonitis. 7. Again noted  intrahepatic and extrahepatic postcholecystectomy biliary dilatation. It is not significantly changed since 12/11/2020 but has increased since 2019. Laboratory and clinical correlation advised. MRCP or ERCP may be helpful if warranted. 8. Diffuse increased esophageal thickening consistent with esophagitis with increased patulous esophagus appearance. Small hiatal hernia. Consider endoscopic follow-up if warranted. 9. Minimal pelvic ascites and remaining findings discussed above. 10. Discussed over the phone with Dr. Dolores FrameSung at 3:50 a.m., 06/02/2021. Electronically Signed   By: Almira BarKeith  Chesser M.D.   On: 06/02/2021 03:51   DG Chest Port 1 View  Result Date: 06/02/2021 CLINICAL DATA:  Status post RIGHT thoracentesis EXAM: PORTABLE CHEST 1 VIEW COMPARISON:  Radiograph 06/01/2021, CT 06/02/2021 FINDINGS: Reduction in RIGHT pleural fluid following thoracentesis. Masslike consolidation remains in the RIGHT midlung. No pneumothorax. LEFT lung clear. IMPRESSION: 1. No pneumothorax following RIGHT thoracentesis. 2. Decrease in RIGHT pleural fluid volume. Electronically Signed   By: Genevive BiStewart  Edmunds M.D.   On: 06/02/2021 15:23   DG Chest Port 1 View  Result Date: 06/01/2021 CLINICAL DATA:  Mid chest pain. EXAM: PORTABLE CHEST 1 VIEW COMPARISON:  April 10, 2021 FINDINGS: Mild, diffusely increased interstitial lung markings are seen with mild prominence of the pulmonary vasculature. There is mild right  suprahilar and infrahilar peribronchial cuffing with marked severity airspace disease seen within the mid right lung. There is a small right pleural effusion. Elevation of the right hemidiaphragm is noted. No pneumothorax is identified. The heart size and mediastinal contours are within normal limits. There is marked severity calcification of the aortic arch. Degenerative changes seen throughout the thoracic spine. IMPRESSION: 1. Marked severity infiltrate within the mid right lung, with a superimposed component of congestive  heart failure. 2. Small right pleural effusion. Electronically Signed   By: Aram Candela M.D.   On: 06/01/2021 23:43   DG Foot Complete Right  Result Date: 06/02/2021 CLINICAL DATA:  Right foot third toe injury 04/05/2021. EXAM: RIGHT FOOT COMPLETE - 3+ VIEW COMPARISON:  Right foot plain films 04/10/2021. FINDINGS: Osteopenia. No fracture or erosive lesion is seen. Hammertoe deformities limited assessment of the phalanges. There was previously disproportionate soft tissue swelling of third toe which is no longer seen. There are vascular calcifications in the distal foreleg. Hallux valgus and advanced first MTP joint DJD are again shown, with osteophytes along the articulation of the hallux head with the hallux sesamoid ossicles. There is additional joint narrowing and osteophytes of the tarsometatarsal and tarsal tarsal joints, moderate nonerosive plantar heel spur and a small nonerosive posterior calcaneal spur. IMPRESSION: 1. Limited assessment of the phalanges due to hammertoe deformities. No acute abnormality within study limitations. 2. Disproportionate swelling previously was noted in the third toe but not seen today. 3. Osteopenia and degenerative change, chronic hallux valgus. 4. Vascular calcifications in the distal foreleg. Electronically Signed   By: Almira Bar M.D.   On: 06/02/2021 01:27      ASSESSMENT/PLAN   Right lung consolidated pneumonia Recent COVID 19 infection 04/10/21 -this may be post viral LRTI bacterial pneuomonia vs malignancy -paitent on vancomycin and Unasyn currently  - MRSA pcr  -cytology from thoracentesis is pending -strep pneumonand legionella negative -possible aspiratoin due to mucoid debris or fluid opacifying segmental RLL    Right pleural effusion   - neutrophil predominant speciment with other labs still in process  -s/p 1.2L fluid removed.  Patient has at least 1 life-threatening severe medical condition which is being evaluated managed during this  patient visit   Non-massive PE -Nonoccluding linear thrombus in segmental and subsegmental arteries in the right upper and lower lobes -on heparin currently may transition to eliquis when appropriate    Hiatal hernia with esophagitis and mucosal thickening    GI consultation   Thank you for allowing me to participate in the care of this patient.   Patient/Family are satisfied with care plan and all questions have been answered.  This document was prepared using Dragon voice recognition software and may include unintentional dictation errors.     Vida Rigger, M.D.  Division of Pulmonary & Critical Care Medicine  [facility]

## 2021-06-02 NOTE — Consult Note (Addendum)
?  Subjective:  ?Patient ID: Jenna Dudley, female    DOB: 13-Jul-1935,  MRN: [id number] ? ?A 86 y.o. 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  She has not been able to provide any information in regards to the toe and how long has been infected.  She is a very poor historian.  HPI is limited ? ?Objective:  ? ?Vitals:  ? 06/02/21 0544 06/02/21 0748  ?BP: (!) 131/58 123/66  ?Pulse: 95 85  ?Resp: 20 16  ?Temp: 97.7 ?F (36.5 ?C) 98.4 ?F (36.9 ?C)  ?SpO2: 96% 100%  ? ?General AA&O x3. Normal mood and affect.  ?Vascular Dorsalis pedis and posterior tibial pulses non palpable ?Diminished brisk cap refill to digits pedal hair not present.  ?Neurologic Epicritic sensation grossly intact.  ?Dermatologic Right third digit wound with a hard fibrotic eschar covering to the PIPJ joint.  Redness noted up to the metatarsophalangeal joint.  No malodor present.  No purulent drainage expressed.  ?Orthopedic: MMT 5/5 in dorsiflexion, plantarflexion, inversion, and eversion. ?Normal joint ROM without pain or crepitus.  ? ? ? ?Assessment & Plan:  ?Patient was evaluated and treated and all questions answered. ? ?Right third digit dry gangrene with cellulitis ?-All questions and concerns were discussed to the best my ability with the patient. ?-I believe she will benefit from ABIs PVRs and vascular work-up prior to amputation of the toe and vascular work-up prior to undergoing any type of surgical intervention.  Unable to confirm her ambulatory status. ?-There is concern for possible osteomyelitis and may benefit from a surgical amputation of the right third digit. ?-I will plan on ordering an MRI of the right foot to assess for osteomyelitis. ?-Continue IV antibiotics per primary team ?-Weightbearing as tolerated in a surgical shoe ?-Patient is a high risk of losing that digit versus the limb versus the leg. ? ?Candelaria Stagers,  DPM ? ?Accessible via secure chat for questions or concerns. ? ?

## 2021-06-02 NOTE — Progress Notes (Signed)
Consent for blood placed in chart. 

## 2021-06-02 NOTE — Assessment & Plan Note (Signed)
Secondary to PE.

## 2021-06-02 NOTE — Assessment & Plan Note (Addendum)
Continue heparin drip.

## 2021-06-02 NOTE — Assessment & Plan Note (Deleted)
Continue to follow.

## 2021-06-02 NOTE — Procedures (Signed)
PROCEDURE SUMMARY: ? ?Successful US guided right thoracentesis. ?Yielded 1.2 L of clear yellow fluid. ?Pt tolerated procedure well. ?No immediate complications. ? ?Specimen sent for labs. ?CXR ordered; no post-procedure pneumothorax identified ? ?EBL < 2 mL ? ?Mickie Kay, NP ?06/02/2021 ?3:50 PM ? ? ? ?

## 2021-06-02 NOTE — TOC Benefit Eligibility Note (Signed)
Patient Advocate Encounter ?  ?Insurance verification completed.   ?  ?The patient is currently admitted and upon discharge could be taking ELIQUIS. ?  ?The current 30 day co-pay is, $38.  ? ?The patient is currently admitted and upon discharge could be taking XARELTO. ?  ?The current 30 day co-pay is, $38.  ? ?The patient is currently admitted and upon discharge could be taking BRILINTA. ?  ?The current 30 day co-pay is, $38.  ? ?The patient is currently admitted and upon discharge could be taking PLAVIX. ?  ?The current 30 day co-pay is, $2.20.  ? ? ?The patient is insured through [employer]. ? ? ?  ? ?

## 2021-06-02 NOTE — Assessment & Plan Note (Deleted)
Improved

## 2021-06-02 NOTE — ED Notes (Signed)
Pt Urinated on bed pan.  ?

## 2021-06-02 NOTE — Assessment & Plan Note (Addendum)
Improved. °

## 2021-06-02 NOTE — Progress Notes (Signed)
CODE SEPSIS - PHARMACY COMMUNICATION ? ?**Broad Spectrum Antibiotics should be administered within 1 hour of Sepsis diagnosis** ? ?Time Code Sepsis Called/Page Received: 9201 ? ?Antibiotics Ordered: Azithromycin, Ceftriaxone, Vancomycin ? ?Time of 1st antibiotic administration: 0105 ? ?Otelia Sergeant, PharmD, MBA ?06/02/2021 ?12:56 AM ? ? ? ?

## 2021-06-02 NOTE — Assessment & Plan Note (Signed)
Peak troponin was 108, dropped down to 94.  This is secondary to sepsis.

## 2021-06-02 NOTE — H&P (Addendum)
History and Physical    Jenna Dudley [id number] DOB: Aug 26, 1935 DOA: 06/01/2021  PCP: Jovita Kussmaul, MD   Patient coming from: home   Chief Complaint: Chest pain   HPI: Jenna Dudley is a pleasant 86 y.o. female with medical history significant for chronic diastolic CHF, GERD, bipolar disorder, history of CVA, and right third toe wound, now presenting to the emergency department for evaluation of chest pain.  Patient reported that she has been experiencing increased pain at her right third toe for which she was taking Tylenol 3, but then developed chest pain last night.  She has difficulty providing any further information about her symptoms and tends to reply, "I do not know" to any direct questioning.  IV Ativan was administered in the emergency department but the patient was reportedly having difficulty providing history prior to that.  ED Course: Upon arrival to the ED, patient is found to be afebrile and saturating mid 90s on room air with tachypnea, tachycardia, and blood pressure 120/55.  EKG features sinus tachycardia with PVCs, T wave inversions, and significant artifact.  Chemistry panel notable for potassium 3.1 and CBC features a mild leukocytosis and macrocytic anemia.  Initial lactic acid was 4.0 and troponin 108.    CTA chest reveals segmental and subsegmental PE involving right upper and right lower lobes without right heart strain.  Also noted on CT chest is right-sided opacity with air-fluid level that likely reflects necrotizing pneumonia with abscess.  There are additional groundglass opacities on CT suspected to represent edema.  Also notable on imaging was patulous esophagus, fluid and debris in the right lower lobe bronchi, and pleural effusions.    Blood cultures were collected in the ED and the patient was given 1.75 L of saline, Rocephin, azithromycin, vancomycin, Ativan, nitroglycerin, potassium, and started on IV heparin.  Review of Systems:  Unable to  complete ROS secondary the patient's clinical condition.  Past Medical History:  Diagnosis Date   Bilateral carpal tunnel syndrome 07/27/2014   Bipolar disorder ([place])    geropsych admission to [facility] in Dec 2018   Chronic diastolic CHF (congestive heart failure) ([place])    a. 03/2017 Echo: EF 55-60%, no rwma, Gr1 DD, mild MR; b. 06/2019 Echo: EF 55-60%, no rwma, mod LVH, GrII DD. Nl RV size/fxn. RVSP 54.65mmHg. Mildly dil LA.   Chronic low back pain 07/29/2014   Degenerative arthritis of lumbar spine 07/27/2014   Dementia ([place])    Depression    Esophageal spasm 07/29/2014   GERD (gastroesophageal reflux disease)    History of stress test    a. 04/2017 MV: low risk stress test.  EF 55-65%. Probable apical ant/apical, basal, and mid inflat attenuation artifact vs ischemia/scar.   Hyperlipemia 07/29/2014   Hypertension    Pneumonia    Spinal stenosis 07/29/2014    Past Surgical History:  Procedure Laterality Date   HIP SURGERY Right    INNER EAR SURGERY Right    REPLACEMENT TOTAL KNEE BILATERAL Bilateral 07/27/14    Social History:   reports that she quit smoking about 45 years ago. Her smoking use included cigarettes. She has never used smokeless tobacco. She reports that she does not drink alcohol and does not use drugs.  Allergies  Allergen Reactions   Aspirin Other (See Comments)    Unknown reaction   Sulfa Antibiotics Other (See Comments)    Unknown reaction    Family History  Problem Relation Age of Onset   Hypertension Mother  Stroke Mother      Prior to Admission medications   Medication Sig Start Date End Date Taking? Authorizing Provider  acetaminophen (TYLENOL) 325 MG tablet Take 650 mg by mouth every 6 (six) hours.   Yes [provider]  acetaminophen-codeine (TYLENOL #3) 300-30 MG per tablet Take 1 tablet by mouth every 8 (eight) hours as needed for moderate pain or severe pain.   Yes [provider]  albuterol (VENTOLIN HFA) 108 (90  Base) MCG/ACT inhaler Inhale 2 puffs into the lungs every 6 (six) hours as needed for wheezing or shortness of breath. 04/14/21  Yes Hall, Carole N, DO  benzonatate (TESSALON PERLES) 100 MG capsule Take 1 capsule (100 mg total) by mouth every 6 (six) hours as needed for cough. 04/14/21 04/14/22 Yes Kayleen Memos, DO  HYDROcodone-acetaminophen (NORCO/VICODIN) 5-325 MG tablet Take 1 tablet by mouth every 6 (six) hours as needed. 05/31/21  Yes [provider]  metoprolol tartrate (LOPRESSOR) 25 MG tablet Take 1 tablet (25 mg total) by mouth 2 (two) times daily. 04/14/21 06/02/21 Yes Hall, Lorenda Cahill, DO  polyethylene glycol (MIRALAX) 17 g packet Take 17 g by mouth daily as needed for mild constipation. 04/14/21  Yes Kayleen Memos, DO  cholecalciferol (VITAMIN D) 1000 units tablet Take 1,000 Units by mouth daily.    [provider]  clindamycin (CLEOCIN) 300 MG capsule Take 300 mg by mouth every 6 (six) hours. 05/31/21   [provider]  furosemide (LASIX) 20 MG tablet Take 2 tablets (40 mg total) by mouth daily. 12/18/20   Pokhrel, Corrie Mckusick, MD  Lactobacillus Acid-Pectin (ACIDOPHILUS/PECTIN) CAPS Take by mouth.    [provider]  magnesium oxide (MAG-OX) 400 MG tablet Take 400 mg by mouth daily.    [provider]  mirtazapine (REMERON) 45 MG tablet Take 1 tablet (45 mg total) by mouth at bedtime. 02/10/20   Clapacs, Madie Reno, MD  naloxone [facility]) nasal spray 4 mg/0.1 mL SMARTSIG:1 Spray(s) Both Nares Daily PRN 07/22/20   [provider]  omeprazole (PRILOSEC) 20 MG capsule Take 20 mg by mouth 2 (two) times daily before a meal.     [provider]  povidone-iodine (BETADINE) 10 % external solution Apply topically daily. 04/14/21   Kayleen Memos, DO  Probiotic Product (PROBIOTIC DAILY PO) Take by mouth daily.    [provider]  senna-docusate (SENOKOT-S) 8.6-50 MG tablet Take 1 tablet by mouth 2 (two) times daily. Stool softener 12/17/20 12/17/21   Pokhrel, Corrie Mckusick, MD  simvastatin (ZOCOR) 40 MG tablet Take 40 mg by mouth at bedtime.     [provider]  venlafaxine XR (EFFEXOR-XR) 150 MG 24 hr capsule 1 po daily 02/10/20   Clapacs, Madie Reno, MD  vitamin B-12 (CYANOCOBALAMIN) 500 MCG tablet Take 1 tablet (500 mcg total) by mouth daily. 12/17/20 12/17/21  Flora Lipps, MD    Physical Exam: Vitals:   06/02/21 0130 06/02/21 0200 06/02/21 0335 06/02/21 0400  BP:  (!) 149/61 (!) 123/47 (!) 123/47  Pulse: (!) 122 (!) 110 (!) 104 92  Resp: 19 (!) 31 14 (!) 24  Temp:      TempSrc:      SpO2: 100% 100% 99% 100%  Weight:      Height:        Constitutional: NAD, calm  Eyes: PERTLA, lids and conjunctivae normal ENMT: Mucous membranes are moist. Posterior pharynx clear of any exudate or lesions.   Neck: supple, no masses  Respiratory: Diminished  breath sounds with rales bilaterally. Tachypnea. No accessory muscle use.   Cardiovascular: Rate ~100 and regular. Neck veins distended. No LE edema.  Abdomen: Soft, no rebound pain or guarding. Bowel sounds active.  Musculoskeletal: no clubbing / cyanosis. No joint deformity upper and lower extremities.   Skin: Necrotic tissues at distal 3rd toe on right with proximal erythema and edema extending to forefoot. Otherwise warm, dry, well-perfused. Neurologic: CN 2-12 grossly intact. Moving all extremities. Somnolent, wakes to loud voice.  Psychiatric: Calm. Cooperative.    Labs and Imaging on Admission: I have personally reviewed following labs and imaging studies  CBC: Recent Labs  Lab 06/01/21 2357  WBC 11.1*  NEUTROABS 8.0*  HGB 8.1*  HCT 25.8*  MCV 114.7*  PLT [id number]   Basic Metabolic Panel: Recent Labs  Lab 06/01/21 2357  NA 135  K 3.1*  CL 96*  CO2 25  GLUCOSE 122*  BUN 18  CREATININE 0.88  CALCIUM 8.4*   GFR: Estimated Creatinine Clearance: 34.8 mL/min (by C-G formula based on SCr of 0.88 mg/dL). Liver Function Tests: Recent Labs  Lab 06/01/21 2357  AST 24   ALT 9  ALKPHOS 42  BILITOT 0.8  PROT 7.2  ALBUMIN 3.1*   Recent Labs  Lab 06/01/21 2357  LIPASE 28   No results for input(s): AMMONIA in the last 168 hours. Coagulation Profile: No results for input(s): INR, PROTIME in the last 168 hours. Cardiac Enzymes: No results for input(s): CKTOTAL, CKMB, CKMBINDEX, TROPONINI in the last 168 hours. BNP (last 3 results) No results for input(s): PROBNP in the last 8760 hours. HbA1C: No results for input(s): HGBA1C in the last 72 hours. CBG: No results for input(s): GLUCAP in the last 168 hours. Lipid Profile: No results for input(s): CHOL, HDL, LDLCALC, TRIG, CHOLHDL, LDLDIRECT in the last 72 hours. Thyroid Function Tests: No results for input(s): TSH, T4TOTAL, FREET4, T3FREE, THYROIDAB in the last 72 hours. Anemia Panel: No results for input(s): VITAMINB12, FOLATE, FERRITIN, TIBC, IRON, RETICCTPCT in the last 72 hours. Urine analysis:    Component Value Date/Time   COLORURINE YELLOW (A) 06/01/2021 2335   APPEARANCEUR HAZY (A) 06/01/2021 2335   APPEARANCEUR Cloudy 03/29/2012 2142   LABSPEC 1.024 06/01/2021 2335   LABSPEC 1.026 03/29/2012 2142   PHURINE 5.0 06/01/2021 [place] 06/01/2021 2335   GLUCOSEU Negative 03/29/2012 2142   HGBUR NEGATIVE 06/01/2021 2335   [place] NEGATIVE 06/01/2021 2335   BILIRUBINUR Negative 03/29/2012 2142   [place] NEGATIVE 06/01/2021 2335   PROTEINUR NEGATIVE 06/01/2021 2335   NITRITE NEGATIVE 06/01/2021 2335   LEUKOCYTESUR SMALL (A) 06/01/2021 2335   LEUKOCYTESUR 3+ 03/29/2012 2142   Sepsis Labs: @LABRCNTIP (procalcitonin:4,lacticidven:4) ) Recent Results (from the past 240 hour(s))  Resp Panel by RT-PCR (Flu A&B, Covid) Nasopharyngeal Swab     Status: None   Collection Time: 06/01/21 11:24 PM   Specimen: Nasopharyngeal Swab; Nasopharyngeal(NP) swabs in vial transport medium  Result Value Ref Range Status   SARS Coronavirus 2 by RT PCR NEGATIVE NEGATIVE Final    Comment:  (NOTE) SARS-CoV-2 target nucleic acids are NOT DETECTED.  The SARS-CoV-2 RNA is generally detectable in upper respiratory specimens during the acute phase of infection. The lowest concentration of SARS-CoV-2 viral copies this assay can detect is 138 copies/mL. A negative result does not preclude SARS-Cov-2 infection and should not be used as the sole basis for treatment or other patient management decisions. A negative result may occur with  improper specimen collection/handling, submission of specimen  other than nasopharyngeal swab, presence of viral mutation(s) within the areas targeted by this assay, and inadequate number of viral copies(<138 copies/mL). A negative result must be combined with clinical observations, patient history, and epidemiological information. The expected result is Negative.  Fact Sheet for Patients:  EntrepreneurPulse.com.au  Fact Sheet for Healthcare Providers:  IncredibleEmployment.be  This test is no t yet approved or cleared by the [place] FDA and  has been authorized for detection and/or diagnosis of SARS-CoV-2 by FDA under an Emergency Use Authorization (EUA). This EUA will remain  in effect (meaning this test can be used) for the duration of the COVID-19 declaration under Section 564(b)(1) of the Act, 21 U.S.C.section 360bbb-3(b)(1), unless the authorization is terminated  or revoked sooner.       Influenza A by PCR NEGATIVE NEGATIVE Final   Influenza B by PCR NEGATIVE NEGATIVE Final    Comment: (NOTE) The Xpert Xpress SARS-CoV-2/FLU/RSV plus assay is intended as an aid in the diagnosis of influenza from Nasopharyngeal swab specimens and should not be used as a sole basis for treatment. Nasal washings and aspirates are unacceptable for Xpert Xpress SARS-CoV-2/FLU/RSV testing.  Fact Sheet for Patients: EntrepreneurPulse.com.au  Fact Sheet for Healthcare  Providers: IncredibleEmployment.be  This test is not yet approved or cleared by the [place] FDA and has been authorized for detection and/or diagnosis of SARS-CoV-2 by FDA under an Emergency Use Authorization (EUA). This EUA will remain in effect (meaning this test can be used) for the duration of the COVID-19 declaration under Section 564(b)(1) of the Act, 21 U.S.C. section 360bbb-3(b)(1), unless the authorization is terminated or revoked.  Performed at [facility], [address] [place], [place] [address]      Radiological Exams on Admission: CT Angio Chest PE W/Cm &/Or Wo Cm  Result Date: 06/02/2021 CLINICAL DATA:  Chest and abdomen pain. EXAM: CT ANGIOGRAPHY CHEST CT ABDOMEN AND PELVIS WITH CONTRAST TECHNIQUE: Multidetector CT imaging of the chest was performed using the standard protocol during bolus administration of intravenous contrast. Multiplanar CT image reconstructions and MIPs were obtained to evaluate the vascular anatomy. Multidetector CT imaging of the abdomen and pelvis was performed using the standard protocol during bolus administration of intravenous contrast. RADIATION DOSE REDUCTION: This exam was performed according to the departmental dose-optimization program which includes automated exposure control, adjustment of the mA and/or kV according to patient size and/or use of iterative reconstruction technique. CONTRAST:  40mL OMNIPAQUE IOHEXOL 350 MG/ML SOLN COMPARISON:  Abdomen and pelvis CT with contrast 12/11/2020, CT chest, abdomen pelvis with contrast 12/20/2017, CTA chest 04/17/2017 FINDINGS: CTA CHEST FINDINGS Cardiovascular: Pulmonary arteries are upper limit of normal in caliber. Nonoccluding linear thrombotic filling defect is seen in right upper lobe segmental and 1 or possibly 2 subsegmental upstream arteries to the apical area and is also seen in 3 right lower lobe segmental and several downstream subsegmental arteries. Overall  clot burden is small without findings of acute right heart strain. There is cardiomegaly with left chamber predominance, three-vessel calcific CAD, and thinning and fatty infiltration in the apical myocardium again noted consistent with a prior apical infarct. No pericardial effusion is seen. No other pulmonary embolus is seen. Superior pulmonary veins are slightly prominent but no more than previously. There are calcifications of the mitral ring, and heavy aortic calcific atherosclerosis,without aortic aneurysm, penetrating ulcer or dissection. There is atherosclerosis in the great vessels without flow-limiting stenosis. Mediastinum/Nodes: Axillary spaces are clear. The thyroid gland is unremarkable. There is interval increased  diffuse thickening and patulous Ness of the esophagus. Small hiatal hernia. Findings may suggest diffuse esophagitis. Endoscopic follow-up may be indicated. There are increasingly mildly enlarged lymph nodes in the AP window, precarinal and subcarinal mediastinum, and mildly prominent lymph nodes in the lower right hilum. No tracheal lesion or fluid is seen. The central airways are clear and normal caliber. There is no bulky or encasing adenopathy. Lungs/Pleura: A moderate-to-large right pleural effusion is again demonstrated, similar to 12/11/2020 but was not seen in 2019. There is a small layering left pleural effusion which is new from both prior studies. Increased subpleural interstitial markings in the lung bases and apices are noted probably due to interstitial edema. In the right lower lobe anterior basal area there is fluid and debris in the area segmental bronchi and multiple downstream small bronchi. A confluent opacity extending from the hilum to the anterolateral pleural surface has developed in this segment measuring 6.3 by 3.0 cm on series 3 axial 79 with only scant air bronchograms only in its medial aspect. The area of consolidation contains an air-fluid level measuring 1.6 x  1.3 cm on axial 75 which is worrisome for a small pulmonary abscess with possibly necrotizing pneumonic process. Underlying mass is difficult to exclude but should not have reached this size in 6 months. There are additional scattered ground-glass opacities in the more central lungs bilaterally and in the peripheral partially aerated right lower lobe, which could be ground-glass edema, pneumonitis or combination. There is compressive atelectasis along side the right pleural effusion. Musculoskeletal: There is dextroscoliosis and multilevel bridging enthesopathy of the thoracic spine. No acute or significant osseous findings. Review of the MIP images confirms the above findings. CT ABDOMEN and PELVIS FINDINGS Hepatobiliary: As previously the liver enhancement is heterogeneous initially but homogeneous on delayed phase compatible with perfusional differences. There is no mass enhancement. Post cholecystectomy intrahepatic and extrahepatic biliary dilatation is seen with common bile duct 1.8 cm, previously 1.6 cm. There is no appreciable ductal stone no increased prominence in the pre-existing intrahepatic biliary dilatation. Pancreas: Atrophic without appreciable mass or ductal dilatation. Spleen: Normal in size and enhancement. Adrenals/Urinary Tract: There is no adrenal mass. There is scattered cortical scarring of the kidneys without visible cortical mass. There is no evidence of urinary stone or obstruction. No focal bladder abnormality. Stomach/Bowel: Small hiatal hernia. Chronic thickened folds in the stomach unchanged. No bowel obstruction or inflammation. An appendix is not seen. There is mild fecal stasis, without evidence of colitis/diverticulitis. Vascular/Lymphatic: Heavy extensive calcification in the aorta, iliac and visceral branch arteries is again noted. There is no AAA. Major branch vessels appear patent. The portal and splenic veins are patent and normal caliber. There is no adenopathy.  Reproductive: Surgically absent uterus.  No adnexal mass. Other: There is body wall anasarca. There is trace ascites in the pelvis. Musculoskeletal: There is advanced degenerative change of the lumbar spine and chronic wedging of the T12 and L1 vertebrae, multilevel acquired spinal canal and foraminal stenosis lumbar spine. No acute regional skeletal findings. Mild-to-moderate hip DJD. Review of the MIP images confirms the above findings. IMPRESSION: 1. Nonoccluding linear thrombus in segmental and subsegmental arteries in the right upper and lower lobes, overall small clot burden common, no findings of acute right heart strain. 2. 6.3 x 3.0 cm confluent opacity from the hilum to the pleural surface, anterior basal segment right lower lobe. There are mildly enlarged mediastinal and hilar lymph nodes which could be reactive but are nonspecific. 3. Underlying mass  difficult to exclude but this is more likely a necrotizing pneumonic process and contains a small 1.6 cm air-fluid level which is probably an abscess. There is mucoid debris or fluid opacifying segmental and downstream subsegmental bronchi in the area. 4. Cardiomegaly, calcific CAD, aortic atherosclerosis and chronic apical left ventricular infarct. 5. Evidence of at least mild interstitial edema, moderate-to-large left right pleural effusion which was seen on 12/11/2020, and new small left pleural effusion with increased body wall edema. 6. Additional ground-glass opacities which could indicate additional edema and or pneumonitis. 7. Again noted intrahepatic and extrahepatic postcholecystectomy biliary dilatation. It is not significantly changed since 12/11/2020 but has increased since 2019. Laboratory and clinical correlation advised. MRCP or ERCP may be helpful if warranted. 8. Diffuse increased esophageal thickening consistent with esophagitis with increased patulous esophagus appearance. Small hiatal hernia. Consider endoscopic follow-up if warranted.  9. Minimal pelvic ascites and remaining findings discussed above. 10. Discussed over the phone with Dr. Beather Arbour at 3:50 a.m., 06/02/2021. Electronically Signed   By: Telford Nab M.D.   On: 06/02/2021 03:51   CT Abdomen Pelvis W Contrast  Result Date: 06/02/2021 CLINICAL DATA:  Chest and abdomen pain. EXAM: CT ANGIOGRAPHY CHEST CT ABDOMEN AND PELVIS WITH CONTRAST TECHNIQUE: Multidetector CT imaging of the chest was performed using the standard protocol during bolus administration of intravenous contrast. Multiplanar CT image reconstructions and MIPs were obtained to evaluate the vascular anatomy. Multidetector CT imaging of the abdomen and pelvis was performed using the standard protocol during bolus administration of intravenous contrast. RADIATION DOSE REDUCTION: This exam was performed according to the departmental dose-optimization program which includes automated exposure control, adjustment of the mA and/or kV according to patient size and/or use of iterative reconstruction technique. CONTRAST:  2mL OMNIPAQUE IOHEXOL 350 MG/ML SOLN COMPARISON:  Abdomen and pelvis CT with contrast 12/11/2020, CT chest, abdomen pelvis with contrast 12/20/2017, CTA chest 04/17/2017 FINDINGS: CTA CHEST FINDINGS Cardiovascular: Pulmonary arteries are upper limit of normal in caliber. Nonoccluding linear thrombotic filling defect is seen in right upper lobe segmental and 1 or possibly 2 subsegmental upstream arteries to the apical area and is also seen in 3 right lower lobe segmental and several downstream subsegmental arteries. Overall clot burden is small without findings of acute right heart strain. There is cardiomegaly with left chamber predominance, three-vessel calcific CAD, and thinning and fatty infiltration in the apical myocardium again noted consistent with a prior apical infarct. No pericardial effusion is seen. No other pulmonary embolus is seen. Superior pulmonary veins are slightly prominent but no more than  previously. There are calcifications of the mitral ring, and heavy aortic calcific atherosclerosis,without aortic aneurysm, penetrating ulcer or dissection. There is atherosclerosis in the great vessels without flow-limiting stenosis. Mediastinum/Nodes: Axillary spaces are clear. The thyroid gland is unremarkable. There is interval increased diffuse thickening and patulous Ness of the esophagus. Small hiatal hernia. Findings may suggest diffuse esophagitis. Endoscopic follow-up may be indicated. There are increasingly mildly enlarged lymph nodes in the AP window, precarinal and subcarinal mediastinum, and mildly prominent lymph nodes in the lower right hilum. No tracheal lesion or fluid is seen. The central airways are clear and normal caliber. There is no bulky or encasing adenopathy. Lungs/Pleura: A moderate-to-large right pleural effusion is again demonstrated, similar to 12/11/2020 but was not seen in 2019. There is a small layering left pleural effusion which is new from both prior studies. Increased subpleural interstitial markings in the lung bases and apices are noted probably due to interstitial edema.  In the right lower lobe anterior basal area there is fluid and debris in the area segmental bronchi and multiple downstream small bronchi. A confluent opacity extending from the hilum to the anterolateral pleural surface has developed in this segment measuring 6.3 by 3.0 cm on series 3 axial 79 with only scant air bronchograms only in its medial aspect. The area of consolidation contains an air-fluid level measuring 1.6 x 1.3 cm on axial 75 which is worrisome for a small pulmonary abscess with possibly necrotizing pneumonic process. Underlying mass is difficult to exclude but should not have reached this size in 6 months. There are additional scattered ground-glass opacities in the more central lungs bilaterally and in the peripheral partially aerated right lower lobe, which could be ground-glass edema,  pneumonitis or combination. There is compressive atelectasis along side the right pleural effusion. Musculoskeletal: There is dextroscoliosis and multilevel bridging enthesopathy of the thoracic spine. No acute or significant osseous findings. Review of the MIP images confirms the above findings. CT ABDOMEN and PELVIS FINDINGS Hepatobiliary: As previously the liver enhancement is heterogeneous initially but homogeneous on delayed phase compatible with perfusional differences. There is no mass enhancement. Post cholecystectomy intrahepatic and extrahepatic biliary dilatation is seen with common bile duct 1.8 cm, previously 1.6 cm. There is no appreciable ductal stone no increased prominence in the pre-existing intrahepatic biliary dilatation. Pancreas: Atrophic without appreciable mass or ductal dilatation. Spleen: Normal in size and enhancement. Adrenals/Urinary Tract: There is no adrenal mass. There is scattered cortical scarring of the kidneys without visible cortical mass. There is no evidence of urinary stone or obstruction. No focal bladder abnormality. Stomach/Bowel: Small hiatal hernia. Chronic thickened folds in the stomach unchanged. No bowel obstruction or inflammation. An appendix is not seen. There is mild fecal stasis, without evidence of colitis/diverticulitis. Vascular/Lymphatic: Heavy extensive calcification in the aorta, iliac and visceral branch arteries is again noted. There is no AAA. Major branch vessels appear patent. The portal and splenic veins are patent and normal caliber. There is no adenopathy. Reproductive: Surgically absent uterus.  No adnexal mass. Other: There is body wall anasarca. There is trace ascites in the pelvis. Musculoskeletal: There is advanced degenerative change of the lumbar spine and chronic wedging of the T12 and L1 vertebrae, multilevel acquired spinal canal and foraminal stenosis lumbar spine. No acute regional skeletal findings. Mild-to-moderate hip DJD. Review of  the MIP images confirms the above findings. IMPRESSION: 1. Nonoccluding linear thrombus in segmental and subsegmental arteries in the right upper and lower lobes, overall small clot burden common, no findings of acute right heart strain. 2. 6.3 x 3.0 cm confluent opacity from the hilum to the pleural surface, anterior basal segment right lower lobe. There are mildly enlarged mediastinal and hilar lymph nodes which could be reactive but are nonspecific. 3. Underlying mass difficult to exclude but this is more likely a necrotizing pneumonic process and contains a small 1.6 cm air-fluid level which is probably an abscess. There is mucoid debris or fluid opacifying segmental and downstream subsegmental bronchi in the area. 4. Cardiomegaly, calcific CAD, aortic atherosclerosis and chronic apical left ventricular infarct. 5. Evidence of at least mild interstitial edema, moderate-to-large left right pleural effusion which was seen on 12/11/2020, and new small left pleural effusion with increased body wall edema. 6. Additional ground-glass opacities which could indicate additional edema and or pneumonitis. 7. Again noted intrahepatic and extrahepatic postcholecystectomy biliary dilatation. It is not significantly changed since 12/11/2020 but has increased since 2019. Laboratory and clinical correlation  advised. MRCP or ERCP may be helpful if warranted. 8. Diffuse increased esophageal thickening consistent with esophagitis with increased patulous esophagus appearance. Small hiatal hernia. Consider endoscopic follow-up if warranted. 9. Minimal pelvic ascites and remaining findings discussed above. 10. Discussed over the phone with Dr. Beather Arbour at 3:50 a.m., 06/02/2021. Electronically Signed   By: Telford Nab M.D.   On: 06/02/2021 03:51   DG Chest Port 1 View  Result Date: 06/01/2021 CLINICAL DATA:  Mid chest pain. EXAM: PORTABLE CHEST 1 VIEW COMPARISON:  April 10, 2021 FINDINGS: Mild, diffusely increased interstitial lung  markings are seen with mild prominence of the pulmonary vasculature. There is mild right suprahilar and infrahilar peribronchial cuffing with marked severity airspace disease seen within the mid right lung. There is a small right pleural effusion. Elevation of the right hemidiaphragm is noted. No pneumothorax is identified. The heart size and mediastinal contours are within normal limits. There is marked severity calcification of the aortic arch. Degenerative changes seen throughout the thoracic spine. IMPRESSION: 1. Marked severity infiltrate within the mid right lung, with a superimposed component of congestive heart failure. 2. Small right pleural effusion. Electronically Signed   By: Virgina Norfolk M.D.   On: 06/01/2021 23:43   DG Foot Complete Right  Result Date: 06/02/2021 CLINICAL DATA:  Right foot third toe injury 04/05/2021. EXAM: RIGHT FOOT COMPLETE - 3+ VIEW COMPARISON:  Right foot plain films 04/10/2021. FINDINGS: Osteopenia. No fracture or erosive lesion is seen. Hammertoe deformities limited assessment of the phalanges. There was previously disproportionate soft tissue swelling of third toe which is no longer seen. There are vascular calcifications in the distal foreleg. Hallux valgus and advanced first MTP joint DJD are again shown, with osteophytes along the articulation of the hallux head with the hallux sesamoid ossicles. There is additional joint narrowing and osteophytes of the tarsometatarsal and tarsal tarsal joints, moderate nonerosive plantar heel spur and a small nonerosive posterior calcaneal spur. IMPRESSION: 1. Limited assessment of the phalanges due to hammertoe deformities. No acute abnormality within study limitations. 2. Disproportionate swelling previously was noted in the third toe but not seen today. 3. Osteopenia and degenerative change, chronic hallux valgus. 4. Vascular calcifications in the distal foreleg. Electronically Signed   By: Telford Nab M.D.   On: 06/02/2021  01:27    EKG: Independently reviewed. Sinus tachycardia, rate 109, PVCs, T-wave inversion, interpretation limited by artifact.   Assessment/Plan   1. Sepsis d/t pneumonia  - Presents with chest pain, noted to be dyspneic and has right-sided infiltrate on CT with air-fluid level, likely lung abscess from aspiration given patulous esophagus and fluid/debris in RLL bronchi  - Blood cultures collected in ED and Rocephin and azithromycin started  - Treat with Unasyn for now, check sputum culture, trend procalcitonin    2. Acute on chronic diastolic CHF  - Appears to have pulmonary edema on imaging and neck veins distended  - EF was 60-65% in Sept 2022  - Hold Lasix initially given concern for concomitant sepsis with elevated lactate that cleared with IVF, monitor wt and I/Os, will likely need diuresis this admission    3. Pulmonary embolism  - Presents with chest pain and found to have segmental and subsegmental PE involving RUL and RLL pulmonary arteries without right heart strain  - Continue IV heparin for now, check echocardiogram    4. Elevated troponin  - Presents with chest pain and has HS troponin of 108 then 94  - T-wave inversions appear to be chronic  and the chest pain and troponin elevation are likely d/t PNA and PE with demand ischemia rather than ACS  - Continue IV heparin for now in light of PE and check echocardiogram    5. Right foot infection  - Right 3rd toe wound with necrosis distally and more proximal swelling and erythema  - No acute osseous findings on plain radiographs  - Blood cultures collected in ED and vancomycin started  - Continue vancomycin, consider inpatient podiatry or ortho consultation    6. Bipolar disorder  - Calm on admission  - Follow-up pharmacy medication-reconciliation    7. Hypokalemia  - Potassium replaced in ED   - Repeat chem panel    DVT prophylaxis: IV heparin  Code Status: Full  Level of Care: Level of care: Telemetry  Medical Family Communication: Husband updated from ED  Disposition Plan:  Patient is from: home  Anticipated d/c is to: TBD Anticipated d/c date is: 06/06/21  Patient currently: pending SLP consult, cultures, improvement in respiratory status, echocardiogram, conversion to oral medications Consults called: none  Admission status: Inpatient     Vianne Bulls, MD [facility]  06/02/2021, 4:40 AM

## 2021-06-02 NOTE — Evaluation (Signed)
Clinical/Bedside Swallow Evaluation Patient Details  Name: Jenna Dudley MRN: [id number] Date of Birth: 1935-08-13  Today's Date: 06/02/2021 Time: SLP Start Time (ACUTE ONLY): 0820 SLP Stop Time (ACUTE ONLY): 0845 SLP Time Calculation (min) (ACUTE ONLY): 25 min  Past Medical History:  Past Medical History:  Diagnosis Date   Bilateral carpal tunnel syndrome 07/27/2014   Bipolar disorder (HCC)    [facility]   Chronic diastolic CHF (congestive heart failure) (HCC)    a. 03/2017 Echo: EF 55-60%, no rwma, Gr1 DD, mild MR; b. 06/2019 Echo: EF 55-60%, no rwma, mod LVH, GrII DD. Nl RV size/fxn. RVSP 54.506mmHg. Mildly dil LA.   Chronic low back pain 07/29/2014   Degenerative arthritis of lumbar spine 07/27/2014   Dementia (HCC)    Depression    Esophageal spasm 07/29/2014   GERD (gastroesophageal reflux disease)    History of stress test    a. 04/2017 MV: low risk stress test.  EF 55-65%. Probable apical ant/apical, basal, and mid inflat attenuation artifact vs ischemia/scar.   Hyperlipemia 07/29/2014   Hypertension    Pneumonia    Spinal stenosis 07/29/2014   Past Surgical History:  Past Surgical History:  Procedure Laterality Date   HIP SURGERY Right    INNER EAR SURGERY Right    REPLACEMENT TOTAL KNEE BILATERAL Bilateral 07/27/14   HPI:  Per Physician's H&P "HPI: Jenna DaftJoan M Wadas is a pleasant 86 y.o. female with medical history significant for chronic diastolic CHF, GERD, bipolar disorder, history of CVA, and right third toe wound, now presenting to the emergency department for evaluation of chest pain.  Patient reported that she has been experiencing increased pain at her right third toe for which she was taking Tylenol 3, but then developed chest pain last night.  She has difficulty providing any further information about her symptoms and tends to reply, "I do not know" to any direct questioning.  IV Ativan was administered in the emergency department but  the patient was reportedly having difficulty providing history prior to that." CTA chest completed - CTA chest reveals segmental and subsegmental PE involving right upper and right lower lobes without right heart strain.  Also noted on CT chest is right-sided opacity with air-fluid level that likely reflects necrotizing pneumonia with abscess.  There are additional groundglass opacities on CT suspected to represent edema.  Also notable on imaging was patulous esophagus, fluid and debris in the right lower lobe bronchi, and pleural effusions.    Assessment / Plan / Recommendation  Clinical Impression  Pt seen for clinical swallowing evaluation. Pt alert, confusion evident. Inconsistently following commands. Pt on room air. Cleared with RN.  Oral motor exam significant for xerostomia, sparse dentition in poor condition with no dentures present (edentulous upper arch; few lower teeth), generalized oral motor weakness, and extraneous facial and lingual movements (?tardive dyskinesia). Full oral motor exam unable to be completed given pt's inconsistent ability to follow commands.   Per chart review, temp and WBC WNL. CTA chest completed 06/01/21 and which noted the following: CTA chest reveals segmental and subsegmental PE involving right upper and right lower lobes without right heart strain.  Also noted on CT chest is right-sided opacity with air-fluid level that likely reflects necrotizing pneumonia with abscess.  There are additional groundglass opacities on CT suspected to represent edema.  Also notable on imaging was patulous esophagus, fluid and debris in the right lower lobe bronchi, and pleural effusions.   Pt offered trials  of thin liquids (via straw), puree (via teaspoon), and solid. Oral phase was functional for thin liquids (via straw). Mildly prolonged A-P transit noted with pureed. Pt politely declined solids trials despite education. Pharyngeal swallow appeared [facility] per clinical assistant. No overt  or subtle s/sx pharyngeal dysphagia. To palpation, pt with seemingly timely swallow initiation and seemingly adequate laryngeal elevation. No change to vocal quality across trials.  Pt is at increased risk for aspiration/aspiration PNA given dental status, mental status, dependence for feeding at present, and multiple medical comorbidities. hx of GERD/esophageal issues.   Recommend initiation of a pureed diet with thin liquids and safe swallowing strategies/aspiration precautions and reflux precautions as outlined below.   Given concern for suspected esophageal dysphagia given results of CTA chest, pt may benefit from GI consult for further esophageal work up.  SLP to f/u per POC for diet tolerance and trial of upgraded textures, as appropriate.   RN and MD made aware of results of assessment, diet recommendations, safe swallowing strategies/aspiration precautions/reflux precautions.   SLP Visit Diagnosis: Dysphagia, oral phase (R13.11);Dysphagia, unspecified (R13.10)    Aspiration Risk  Mild aspiration risk;Risk for inadequate nutrition/hydration    Diet Recommendation Dysphagia 1 (Puree);Thin liquid   Medication Administration: Crushed with puree Supervision: Staff to assist with self feeding;Full supervision/cueing for compensatory strategies (allow for pt to attempt to feed self) Compensations: Minimize environmental distractions;Slow rate;Small sips/bites (REFLUX precautions) Postural Changes: Seated upright at 90 degrees (Upright 60 minutes after meals)    Other  Recommendations Recommended Consults: Consider GI evaluation;Consider esophageal assessment Oral Care Recommendations: Oral care QID;Oral care before and after PO;Staff/trained caregiver to provide oral care    Recommendations for follow up therapy are one component of a multi-disciplinary discharge planning process, led by the attending physician.  Recommendations may be updated based on patient status, additional  functional criteria and insurance authorization.  Follow up Recommendations  (TBD)      Assistance Recommended at Discharge Frequent or constant Supervision/Assistance  Functional Status Assessment Patient has had a recent decline in their functional status and demonstrates the ability to make significant improvements in function in a reasonable and predictable amount of time.  Frequency and Duration min 2x/week  2 weeks       Prognosis Prognosis for Safe Diet Advancement: Fair Barriers to Reach Goals: Cognitive deficits;Severity of deficits;Behavior      Swallow Study   General Date of Onset: 06/01/21 HPI: Per Physician's H&P "HPI: Jenna Dudley is a pleasant 86 y.o. female with medical history significant for chronic diastolic CHF, GERD, bipolar disorder, history of CVA, and right third toe wound, now presenting to the emergency department for evaluation of chest pain.  Patient reported that she has been experiencing increased pain at her right third toe for which she was taking Tylenol 3, but then developed chest pain last night.  She has difficulty providing any further information about her symptoms and tends to reply, "I do not know" to any direct questioning.  IV Ativan was administered in the emergency department but the patient was reportedly having difficulty providing history prior to that." CTA chest completed - CTA chest reveals segmental and subsegmental PE involving right upper and right lower lobes without right heart strain.  Also noted on CT chest is right-sided opacity with air-fluid level that likely reflects necrotizing pneumonia with abscess.  There are additional groundglass opacities on CT suspected to represent edema.  Also notable on imaging was patulous esophagus, fluid and debris in the right lower  lobe bronchi, and pleural effusions. Type of Study: Bedside Swallow Evaluation Previous Swallow Assessment: unknown Diet Prior to this Study: NPO Temperature Spikes  Noted: No Respiratory Status: Room air History of Recent Intubation: No Behavior/Cognition: Alert;Confused;Requires cueing Oral Cavity Assessment: Dry Oral Care Completed by SLP: Yes Oral Cavity - Dentition: Poor condition;Missing dentition Vision: Functional for self-feeding Self-Feeding Abilities: Needs assist (due to confusion) Patient Positioning: Upright in bed Baseline Vocal Quality: Normal Volitional Cough: Cognitively unable to elicit Volitional Swallow: Unable to elicit    Oral/Motor/Sensory Function Overall Oral Motor/Sensory Function: Generalized oral weakness (extraneous eye closing and lingual labial movements; ?tardive dyskinesia)   Thin Liquid Thin Liquid: Within functional limits Presentation: Straw Other Comments: ~4 oz    Puree Puree: Impaired Oral Phase Impairments: Poor awareness of bolus Oral Phase Functional Implications: Prolonged oral transit Other Comments: ~2 oz   Solid     Solid: Not tested (pt declined trial despite encouragement)     Clyde Canterbury, M.S., CCC-SLP Speech-Language Pathologist [facility] [phone] (ASCOM)   Alessandra Bevels Basil Buffin 06/02/2021,10:39 AM

## 2021-06-02 NOTE — Hospital Course (Signed)
Jenna Dudley is a pleasant 86 y.o. female with medical history significant for chronic diastolic CHF, GERD, bipolar disorder, history of CVA, and right third toe wound, now presenting to the emergency department for evaluation of chest pain. In the hospital, she had heart rate of 114, resp irate 28, lactic acid 4.0.  CTA chest showed segmental PEs, right side opacity with a fluid level.  Also showed additional groundglass opacities, thickened esophagus wall.  And moderate to large right-sided pleural effusion.  Patient was given IV fluids, lactic acid level has dropped down to normal.  She is also started on antibiotics with Unasyn and vancomycin.

## 2021-06-02 NOTE — Progress Notes (Addendum)
ANTICOAGULATION CONSULT NOTE - Follow Up Consult ? ?Pharmacy Consult for Heparin ?Indication: pulmonary embolus ? ?Allergies  ?Allergen Reactions  ? Aspirin Other (See Comments)  ?  Unknown reaction  ? Sulfa Antibiotics Other (See Comments)  ?  Unknown reaction  ? ? ?Patient Measurements: ?Height: 5' (152.4 cm) ?Weight: 51.6 kg (113 lb 12.1 oz) ?IBW/kg (Calculated) : 45.5 ?Heparin Dosing Weight: 51.6 ? ?Vital Signs: ?Temp: 98.1 ?F (36.7 ?C) (03/09 1251) ?BP: 129/55 (03/09 1251) ?Pulse Rate: 96 (03/09 1251) ? ?Labs: ?Recent Labs  ?  06/01/21 ?2357 06/02/21 ?0110 06/02/21 ?0413 06/02/21 ?[id number] 06/02/21 ?1223  ?HGB 8.1*  --   --  7.5*  --   ?HCT 25.8*  --   --  23.7*  --   ?PLT 361  --   --  279  --   ?APTT  --   --  31  --   --   ?LABPROT  --   --  15.0  --   --   ?INR  --   --  1.2  --   --   ?HEPARINUNFRC  --   --   --   --  0.11*  ?CREATININE 0.88  --   --  0.87  --   ?TROPONINIHS 108* 94*  --   --   --   ? ? ?Estimated Creatinine Clearance: 35.2 mL/min (by C-G formula based on SCr of 0.87 mg/dL). ? ? ?Assessment: ?86 year old female admitted with CP found to be a result of segmental/subsegmental PEs in upper and lower lobes. Initial heparin gtt subtherapeutic, gtt running without complications. Hgb low in setting of Anemia of CKD. No active bleeding noted.  ? ?Goal of Therapy:  ?Heparin level 0.3-0.7 units/ml ?Monitor platelets by anticoagulation protocol: Yes ?  ?Plan:  ?Give 1500 units bolus x 1 ?Increase heparin infusion to 1000 units/hr ?Check anti-Xa level in 8 hours and daily while on heparin ?Continue to monitor H&H and platelets ? ?Lorenso Courier Chika ?06/02/2021,1:05 PM ? ? ?

## 2021-06-02 NOTE — Assessment & Plan Note (Addendum)
Patient has been evaluated by podiatry,  duplex ultrasound showed peripheral arterial disease, MRI showed third toe osteomyelitis.  Discussed with podiatry yesterday, obtain vascular surgery consult, pending decision about angiogram and amputation.   Continue vancomycin and Unasyn.

## 2021-06-02 NOTE — Assessment & Plan Note (Signed)
Resume home medicines.

## 2021-06-02 NOTE — Assessment & Plan Note (Addendum)
Patient is evaluated by speech therapy, currently on dysphagia 1 diet.  Continue Unasyn.

## 2021-06-02 NOTE — Sepsis Progress Note (Signed)
Elink following Code Sepsis. 

## 2021-06-02 NOTE — Sepsis Progress Note (Signed)
Notified bedside nurse of need to draw repeat lactic acid. 

## 2021-06-02 NOTE — Assessment & Plan Note (Addendum)
Patient met severe sepsis criteria with tachycardia and tachypnea.  Lactic acid level of 4.0.  This is secondary to pneumonia and toe osteomyelitis. Patient is now clinically stable.

## 2021-06-02 NOTE — Assessment & Plan Note (Addendum)
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 Continue oral iron. Hemoglobin is stable.

## 2021-06-02 NOTE — Assessment & Plan Note (Signed)
Acute congestive heart failure ruled out. ?Patient does not seem to have any volume overload.  Continue to follow. ?

## 2021-06-02 NOTE — Progress Notes (Signed)
ANTICOAGULATION CONSULT NOTE ? ?Pharmacy Consult for heparin infusion ?Indication: pulmonary embolus ? ?Allergies  ?Allergen Reactions  ? Aspirin Other (See Comments)  ?  Unknown reaction  ? Sulfa Antibiotics Other (See Comments)  ?  Unknown reaction  ? ? ?Patient Measurements: ?Height: 5' (152.4 cm) ?Weight: 51.6 kg (113 lb 12.1 oz) ?IBW/kg (Calculated) : 45.5 ?Heparin Dosing Weight: 51.6 kg ? ?Vital Signs: ?Temp: 98.4 ?F (36.9 ?C) (03/08 2329) ?Temp Source: Oral (03/08 2329) ?BP: 123/47 (03/09 0400) ?Pulse Rate: 92 (03/09 0400) ? ?Labs: ?Recent Labs  ?  06/01/21 ?2357 06/02/21 ?0110  ?HGB 8.1*  --   ?HCT 25.8*  --   ?PLT 361  --   ?CREATININE 0.88  --   ?TROPONINIHS 108* 94*  ? ? ?Estimated Creatinine Clearance: 34.8 mL/min (by C-G formula based on SCr of 0.88 mg/dL). ? ? ?Medical History: ?Past Medical History:  ?Diagnosis Date  ? Bilateral carpal tunnel syndrome 07/27/2014  ? Bipolar disorder (HCC)   ? geropsych admission to [facility] in Dec 2018  ? Chronic diastolic CHF (congestive heart failure) (HCC)   ? a. 03/2017 Echo: EF 55-60%, no rwma, Gr1 DD, mild MR; b. 06/2019 Echo: EF 55-60%, no rwma, mod LVH, GrII DD. Nl RV size/fxn. RVSP 54.3mmHg. Mildly dil LA.  ? Chronic low back pain 07/29/2014  ? Degenerative arthritis of lumbar spine 07/27/2014  ? Dementia (HCC)   ? Depression   ? Esophageal spasm 07/29/2014  ? GERD (gastroesophageal reflux disease)   ? History of stress test   ? a. 04/2017 MV: low risk stress test.  EF 55-65%. Probable apical ant/apical, basal, and mid inflat attenuation artifact vs ischemia/scar.  ? Hyperlipemia 07/29/2014  ? Hypertension   ? Pneumonia   ? Spinal stenosis 07/29/2014  ? ? ?Assessment: ?Pt is 86 yo presenting to ED w/ CP "and found to have segmental and subsegmental PE involving RUL and RLL pulmonary arteries without right heart strain" ? ?Goal of Therapy:  ?Heparin level 0.3-0.7 units/ml ?Monitor platelets by anticoagulation protocol: Yes ?  ?Plan:  ?Bolus 3100 units x  1 ?Start heparin infusion at 850 units/hr ?Check HL in 8 hr after start of infusion ?CBC daily while on heparin. ? ?Otelia Sergeant, PharmD, MBA ?06/02/2021 ?4:11 AM ? ? ? ?

## 2021-06-03 ENCOUNTER — Inpatient Hospital Stay: Payer: Medicare Other

## 2021-06-03 ENCOUNTER — Inpatient Hospital Stay (HOSPITAL_COMMUNITY)
Admit: 2021-06-03 | Discharge: 2021-06-03 | Disposition: A | Discharge status: Home / Self Care | Payer: Medicare Other | Attending: Family Medicine | Admitting: Family Medicine

## 2021-06-03 DIAGNOSIS — J690 Pneumonitis due to inhalation of food and vomit: Secondary | ICD-10-CM | POA: Diagnosis not present

## 2021-06-03 DIAGNOSIS — R410 Disorientation, unspecified: Secondary | ICD-10-CM

## 2021-06-03 DIAGNOSIS — I2609 Other pulmonary embolism with acute cor pulmonale: Secondary | ICD-10-CM | POA: Diagnosis not present

## 2021-06-03 DIAGNOSIS — M86672 Other chronic osteomyelitis, left ankle and foot: Secondary | ICD-10-CM | POA: Diagnosis not present

## 2021-06-03 DIAGNOSIS — J90 Pleural effusion, not elsewhere classified: Secondary | ICD-10-CM | POA: Diagnosis not present

## 2021-06-03 DIAGNOSIS — J850 Gangrene and necrosis of lung: Secondary | ICD-10-CM | POA: Diagnosis not present

## 2021-06-03 LAB — LEGIONELLA PNEUMOPHILA SEROGP 1 UR AG: L. pneumophila Serogp 1 Ur Ag: NEGATIVE

## 2021-06-03 LAB — RESPIRATORY PANEL BY PCR

## 2021-06-03 LAB — CBC
HCT: 27.4 % — ABNORMAL LOW (ref 36.0–46.0)
Hemoglobin: 8.8 g/dL — ABNORMAL LOW (ref 12.0–15.0)
MCH: 34.2 pg — ABNORMAL HIGH (ref 26.0–34.0)
MCHC: 32.1 g/dL (ref 30.0–36.0)
MCV: 106.6 fL — ABNORMAL HIGH (ref 80.0–100.0)
Platelets: 220 10*3/uL (ref 150–400)
RBC: 2.57 MIL/uL — ABNORMAL LOW (ref 3.87–5.11)
RDW: 20.4 % — ABNORMAL HIGH (ref 11.5–15.5)
WBC: 7.1 10*3/uL (ref 4.0–10.5)
nRBC: 0 % (ref 0.0–0.2)

## 2021-06-03 LAB — TYPE AND SCREEN
ABO/RH(D): AB POS
Antibody Screen: NEGATIVE
Unit division: 0

## 2021-06-03 LAB — BPAM RBC
Blood Product Expiration Date: 202303222359
ISSUE DATE / TIME: 202303092256
Unit Type and Rh: 6200

## 2021-06-03 LAB — BASIC METABOLIC PANEL
Anion gap: 8 (ref 5–15)
BUN: 16 mg/dL (ref 8–23)
CO2: 25 mmol/L (ref 22–32)
Calcium: 8 mg/dL — ABNORMAL LOW (ref 8.9–10.3)
Chloride: 104 mmol/L (ref 98–111)
Creatinine, Ser: 0.81 mg/dL (ref 0.44–1.00)
GFR, Estimated: >60 mL/min (ref >60)
Glucose, Bld: 99 mg/dL (ref 70–99)
Potassium: 3.7 mmol/L (ref 3.5–5.1)
Sodium: 137 mmol/L (ref 135–145)

## 2021-06-03 LAB — ECHOCARDIOGRAM COMPLETE
AR max vel: 0.69 cm2
AV Area VTI: 0.75 cm2
AV Area mean vel: 0.68 cm2
AV Mean grad: 8 mmHg
AV Peak grad: 15.2 mmHg
Ao pk vel: 1.95 m/s
Area-P 1/2: 4.17 cm2
Height: 60 in
MV VTI: 1.35 cm2
S' Lateral: 3.31 cm
Weight: 1680 oz

## 2021-06-03 LAB — PROTEIN, BODY FLUID (OTHER): Total Protein, Body Fluid Other: 1.9 g/dL

## 2021-06-03 LAB — PROCALCITONIN: Procalcitonin: <0.1 ng/mL

## 2021-06-03 LAB — MAGNESIUM: Magnesium: 2.4 mg/dL (ref 1.7–2.4)

## 2021-06-03 LAB — PHOSPHORUS: Phosphorus: 3.7 mg/dL (ref 2.5–4.6)

## 2021-06-03 MED ORDER — ADULT MULTIVITAMIN W/MINERALS CH
1.0000 | ORAL_TABLET | Freq: Every day | ORAL | Status: DC
  Administered 2021-06-03 – 2021-06-14 (×10): 1 via ORAL
  Filled 2021-06-03 (×10): qty 1

## 2021-06-03 MED ORDER — SUCRALFATE 1 G PO TABS
1.0000 g | ORAL_TABLET | Freq: Three times a day (TID) | ORAL | Status: DC
  Administered 2021-06-03 – 2021-06-14 (×38): 1 g via ORAL
  Filled 2021-06-03 (×39): qty 1

## 2021-06-03 MED ORDER — QUETIAPINE FUMARATE 25 MG PO TABS
25.0000 mg | ORAL_TABLET | Freq: Two times a day (BID) | ORAL | Status: DC
  Administered 2021-06-03 (×2): 25 mg via ORAL
  Filled 2021-06-03 (×2): qty 1

## 2021-06-03 MED ORDER — BOOST / RESOURCE BREEZE PO LIQD CUSTOM
1.0000 | Freq: Three times a day (TID) | ORAL | Status: DC
Start: 2021-06-03 — End: 2021-06-14
  Administered 2021-06-03 – 2021-06-14 (×22): 1 via ORAL

## 2021-06-03 NOTE — Progress Notes (Signed)
?Progress Note ? ? ?Patient: Jenna Dudley LYY:[id number] DOB: 03-24-1936 DOA: 06/01/2021     1 ?DOS: the patient was seen and examined on 06/03/2021 ?  ?Brief hospital course: ?Jenna Dudley is a pleasant 86 y.o. female with medical history significant for chronic diastolic CHF, GERD, bipolar disorder, history of CVA, and right third toe wound, now presenting to the emergency department for evaluation of chest pain. ?In the hospital, she had heart rate of 114, resp irate 28, lactic acid 4.0.  CTA chest showed segmental PEs, right side opacity with a fluid level.  Also showed additional groundglass opacities, thickened esophagus wall.  And moderate to large right-sided pleural effusion.  Patient was given IV fluids, lactic acid level has dropped down to normal.  She is also started on antibiotics with Unasyn and vancomycin. ? ?Assessment and Plan: ?* Pneumonia ?Patient is evaluated by speech therapy, currently on dysphagia 1 diet.  Continue Unasyn. ? 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 ? ?Pleural effusion on right ?Status post thoracentesis with removing 1.2 L.  Lab study showed 79% neutrophils, Gram stain and cultures pending.  Glucose high, protein low.  LDH 61.  Appears to be transudate. ? ?Hypomagnesemia ?Improved. ? ?Necrotizing pneumonia ([place]) ?Appreciate pulmonology consult.  Continue antibiotics for now. ? ?Chest pain ?Secondary to PE. ? ?Elevated troponin ?Peak troponin was 108, dropped down to 94.  This is secondary to sepsis. ? ?Pulmonary emboli ([place]) ?Due to concern of sudden drop in hemoglobin, heparin drip was discontinued we will continue hold it until decision is made about toe amputation. ?Patient has segmental/subsegmental PE, okay to hold for now.  I will restart anticoagulation as soon as possible. ? ?Chronic diastolic CHF  (congestive heart failure) ([place]) ?Acute congestive heart failure ruled out. ?Patient does not seem to have any volume overload.  Continue to follow. ? ?Hypokalemia ?Improved. ? ?Right foot infection ?Patient has been evaluated by podiatry, pending MRI and arterial ultrasound study.  Continue vancomycin. ? ?Delirium with dementia ?Patient had significant confusion and agitation overnight.  This is due to new environment.  She is given Seroquel 25 mg twice a day. ? ?Bipolar 1 disorder ([place]) ?Resume home medicines. ? ?Severe sepsis ([place]) ?Patient met severe sepsis criteria with tachycardia and tachypnea.  Lactic acid level of 4.0.  This is secondary to pneumonia. ?Condition has improved. ? ?History of stroke ?Continue to follow. ? ?Blood culture was positive in 1 out of 4 bottles.  Continue Unasyn awaiting final results. ? ? ?  ? ?Subjective:  ?Patient was very agitated last night could not sleep. ?No short of breath. ?No fever or chills. ? ?Physical Exam: ?Vitals:  ? 06/03/21 0142 06/03/21 0427 06/03/21 0444 06/03/21 0929  ?BP:  (!) 153/89 (!) 146/74 121/65  ?Pulse:  (!) 103 91 87  ?Resp:   18 16  ?Temp:   98.2 ?F (36.8 ?C) 98 ?F (36.7 ?C)  ?TempSrc:      ?SpO2:  98% 95% 95%  ?Weight: 47.6 kg     ?Height:      ? ?General exam: Appears calm and comfortable  ?Respiratory system: Clear to auscultation. Respiratory effort normal. ?Cardiovascular system: S1 & S2 heard, RRR. No JVD, murmurs, rubs, gallops or clicks. No pedal edema. ?Gastrointestinal system: Abdomen is nondistended, soft and nontender. No organomegaly or masses felt. Normal bowel sounds heard. ?Central  nervous system: Drowsy and disoriented.. No focal neurological deficits. ?Extremities: Symmetric 5 x 5 power. ?Skin: No rashes, lesions or ulcers ? ?Data Reviewed: ?All lab results. ?Family Communication: Daughter updated at bedside ? ?Disposition: ?Status is: Inpatient ?Remains inpatient appropriate because: Severity of disease and IV treatment. ? Planned  Discharge Destination: Home with Home Health ? ? ? ?Time spent: 36 minutes ? ?Author: ?Sharen Hones, MD ?06/03/2021 10:37 AM ? ?For on call review [email or web address].  ?

## 2021-06-03 NOTE — Progress Notes (Signed)
Initial Nutrition Assessment ? ?DOCUMENTATION CODES:  ? ?Not applicable ? ?INTERVENTION:  ? ?-Boost Breeze po TID, each supplement provides 250 kcal and 9 grams of protein  ?-30 ml Prosource Plus TID, each supplement provides 100 kcals and 15 grams protein ?-MVI with minerals daily ?-Feeding assistance at meals ? ?NUTRITION DIAGNOSIS:  ? ?Inadequate oral intake related to poor appetite as evidenced by per patient/family report, meal completion < 25%. ? ?GOAL:  ? ?Patient will meet greater than or equal to 90% of their needs ? ?MONITOR:  ? ?PO intake, Supplement acceptance, Diet advancement, Labs, Weight trends, Skin, I & O's ? ?REASON FOR ASSESSMENT:  ? ?Rounds ?  ? ?ASSESSMENT:  ? ?Jenna Dudley is a pleasant 86 y.o. female with medical history significant for chronic diastolic CHF, GERD, bipolar disorder, history of CVA, and right third toe wound, now presenting to the emergency department for evaluation of chest pain. ? ?Pt admitted with pneumonia.  ? ?3/9- s/p rt thoracentesis (1.2 L removed) ?3/10- s/p BSE- dysphagia 1 diet with thin liquids ? ?Reviewed I/O's: +1.6 L x 24 hours ? ?Case discussed extensively with SLP. She reports that pt with very poor oral intake and minimal interest in eating. Per SLP, pt has history of not eating for weeks at a time. She has had extensive medical work-up, including psych consultation. SLP reports pt also appears very cachetic. She is receptive to taking [employer].  ?  ?Reviewed wt hx; pt has experienced a 5% wt loss over the past 6 months, which is not significant for time frame. ? ?Highly suspect malnutrition, however, RD unable to identify at this time.   ? ?Medications reviewed and include vitamin B-12, lasix, remeron, and senokot. ? ?Lab Results  ?Component Value Date  ? HGBA1C 6.0 10/03/2015  ? PTA DM medications are none.  ? ?Labs reviewed: CBGS: 109 (inpatient orders for glycemic control are none).   ? ?Diet Order:   ?Diet Order   ? ?       ?  DIET - DYS 1 Room  service appropriate? Yes with Assist; Fluid consistency: Thin  Diet effective now       ?  ? ?  ?  ? ?  ? ? ?EDUCATION NEEDS:  ? ?Not appropriate for education at this time ? ?Skin:  Skin Assessment: Reviewed RN Assessment ? ?Last BM:  06/03/21 ? ?Height:  ? ?Ht Readings from Last 1 Encounters:  ?06/01/21 5' (1.524 m)  ? ? ?Weight:  ? ?Wt Readings from Last 1 Encounters:  ?06/03/21 47.6 kg  ? ? ?Ideal Body Weight:  45.5 kg ? ?BMI:  Body mass index is 20.51 kg/m?. ? ?Estimated Nutritional Needs:  ? ?Kcal:  1450-1650 ? ?Protein:  60-75 grams ? ?Fluid:  > 1.4 L ? ? ? ?Loistine Chance, RD, LDN, CDCES ?Registered Dietitian II ?Certified Diabetes Care and Education Specialist ?Please refer to [facility] for RD and/or RD on-call/weekend/after hours pager  ?

## 2021-06-03 NOTE — Progress Notes (Signed)
*  PRELIMINARY RESULTS* ?Echocardiogram ?2D Echocardiogram has been performed. ? ?Jenna Dudley Jenna Dudley ?06/03/2021, 9:33 AM ?

## 2021-06-03 NOTE — Progress Notes (Signed)
PULMONOLOGY         Date: 06/03/2021,   MRN# [id number] MALEIGH BAGOT 1935/10/31     AdmissionWeight: 51.6 kg                 CurrentWeight: 47.6 kg   Referring physician: Dr Chipper Herb   CHIEF COMPLAINT:   Abnormal CT chest imaging with pleural effusion status postthoracentesis   HISTORY OF PRESENT ILLNESS   This is a pleasant 86 year old female with a history of esophageal spasms and GERD, chronic depression, heart failure with preserved ejection fraction, bipolar disorder, right foot wounds, initially came to the emergency department with chest pain.  She reports chest pain is only been for 1 day.  She was found to have mild hypoxemia with SPO2 in the 90s on room air blood work showed anemia which is chronic and macrocytic as well as mild hypokalemia with a potassium of 3.1 she did have lactic acidosis with mild troponin elevation. She had CTPE done with bilateral PE noted. There is additional consolidated area of right lung which appears to be infiltrate consistent with pneumonia. She has not smoked in >40 years and does not have a history of cancer personally not family history of cancer. Fluid profile is PMN predominant thus far with additional studies pending. PCCM consultation for further evaluation and management.   06/03/21- patient is improved able to speak on phone with family. She is being optimized for dc home   PAST MEDICAL HISTORY   Past Medical History:  Diagnosis Date   Bilateral carpal tunnel syndrome 07/27/2014   Bipolar disorder (HCC)    geropsych admission to [facility] in Dec 2018   Chronic diastolic CHF (congestive heart failure) (HCC)    a. 03/2017 Echo: EF 55-60%, no rwma, Gr1 DD, mild MR; b. 06/2019 Echo: EF 55-60%, no rwma, mod LVH, GrII DD. Nl RV size/fxn. RVSP 54.34mmHg. Mildly dil LA.   Chronic low back pain 07/29/2014   Degenerative arthritis of lumbar spine 07/27/2014   Dementia (HCC)    Depression    Esophageal spasm 07/29/2014   Gastric  reflux 09/17/2014   GERD (gastroesophageal reflux disease)    History of stress test    a. 04/2017 MV: low risk stress test.  EF 55-65%. Probable apical ant/apical, basal, and mid inflat attenuation artifact vs ischemia/scar.   Hyperlipemia 07/29/2014   Hypertension    Pneumonia    Spinal stenosis 07/29/2014     SURGICAL HISTORY   Past Surgical History:  Procedure Laterality Date   HIP SURGERY Right    INNER EAR SURGERY Right    REPLACEMENT TOTAL KNEE BILATERAL Bilateral 07/27/14     FAMILY HISTORY   Family History  Problem Relation Age of Onset   Hypertension Mother    Stroke Mother      SOCIAL HISTORY   Social History   Tobacco Use   Smoking status: Former    Types: Cigarettes    Quit date: 07/29/1975    Years since quitting: 45.8   Smokeless tobacco: Never  Vaping Use   Vaping Use: Never used  Substance Use Topics   Alcohol use: No    Alcohol/week: 0.0 standard drinks   Drug use: No     MEDICATIONS    Home Medication:    Current Medication:  Current Facility-Administered Medications:    acetaminophen (TYLENOL) tablet 650 mg, 650 mg, Oral, Q6H PRN, 650 mg at 06/03/21 0624 **OR** acetaminophen (TYLENOL) suppository 650 mg, 650 mg, Rectal, Q6H PRN, Opyd,  Lavone Neri, MD   albuterol (PROVENTIL) (2.5 MG/3ML) 0.083% nebulizer solution 2.5 mg, 2.5 mg, Nebulization, Q6H PRN, Marrion Coy, MD   Ampicillin-Sulbactam (UNASYN) 3 g in sodium chloride 0.9 % 100 mL IVPB, 3 g, Intravenous, Q8H, Belue, Lendon Collar, RPH, Last Rate: 200 mL/hr at 06/03/21 0101, 3 g at 06/03/21 0101   cyanocobalamin ((VITAMIN B-12)) injection 1,000 mcg, 1,000 mcg, Intramuscular, Daily, Marrion Coy, MD   furosemide (LASIX) tablet 40 mg, 40 mg, Oral, Daily, Marrion Coy, MD, 40 mg at 06/02/21 1614   MEDLINE mouth rinse, 15 mL, Mouth Rinse, BID, Marrion Coy, MD, 15 mL at 06/02/21 2107   metoprolol tartrate (LOPRESSOR) tablet 25 mg, 25 mg, Oral, BID, Marrion Coy, MD, 25 mg at 06/02/21 1615    mirtazapine (REMERON) tablet 45 mg, 45 mg, Oral, QHS, Marrion Coy, MD, 45 mg at 06/02/21 2105   ondansetron (ZOFRAN) tablet 4 mg, 4 mg, Oral, Q6H PRN **OR** ondansetron (ZOFRAN) injection 4 mg, 4 mg, Intravenous, Q6H PRN, Opyd, Lavone Neri, MD   pantoprazole (PROTONIX) EC tablet 40 mg, 40 mg, Oral, Daily, Marrion Coy, MD, 40 mg at 06/02/21 1614   polyethylene glycol (MIRALAX / GLYCOLAX) packet 17 g, 17 g, Oral, Daily PRN, Marrion Coy, MD   QUEtiapine (SEROQUEL) tablet 25 mg, 25 mg, Oral, BID, Marrion Coy, MD   senna-docusate (Senokot-S) tablet 1 tablet, 1 tablet, Oral, BID, Marrion Coy, MD, 1 tablet at 06/02/21 2106   simvastatin (ZOCOR) tablet 40 mg, 40 mg, Oral, QHS, Marrion Coy, MD, 40 mg at 06/02/21 2105   sodium chloride flush (NS) 0.9 % injection 3 mL, 3 mL, Intravenous, Q12H, Opyd, Lavone Neri, MD, 3 mL at 06/02/21 2123   vancomycin (VANCOREADY) IVPB 1250 mg/250 mL, 1,250 mg, Intravenous, Q36H, Otelia Sergeant, RPH   venlafaxine XR (EFFEXOR-XR) 24 hr capsule 75 mg, 75 mg, Oral, Q breakfast, Marrion Coy, MD  Facility-Administered Medications Ordered in Other Encounters:    0.9 %  sodium chloride infusion, , , Continuous PRN, Lily Kocher, CRNA, Continued from Pre-op at 08/07/18 1110   lactated ringers infusion, , Intravenous, Continuous, Clapacs, Jackquline Denmark, MD, New Bag at 07/09/19 1014    ALLERGIES   Aspirin and Sulfa antibiotics     REVIEW OF SYSTEMS    Review of Systems:  Gen:  Denies  fever, sweats, chills weigh loss  HEENT: Denies blurred vision, double vision, ear pain, eye pain, hearing loss, nose bleeds, sore throat Cardiac:  No dizziness, chest pain or heaviness, chest tightness,edema Resp:   Denies cough or sputum porduction, shortness of breath,wheezing, hemoptysis,  Gi: Denies swallowing difficulty, stomach pain, nausea or vomiting, diarrhea, constipation, bowel incontinence Gu:  Denies bladder incontinence, burning urine Ext:   Denies Joint pain, stiffness or  swelling Skin: Denies  skin rash, easy bruising or bleeding or hives Endoc:  Denies polyuria, polydipsia , polyphagia or weight change Psych:   Denies depression, insomnia or hallucinations   Other:  All other systems negative   VS: BP (!) 146/74 (BP Location: Left Arm)    Pulse 91    Temp 98.2 F (36.8 C)    Resp 18    Ht 5' (1.524 m)    Wt 47.6 kg    LMP  (LMP Unknown)    SpO2 95%    BMI 20.51 kg/m      PHYSICAL EXAM    GENERAL:NAD, no fevers, chills, no weakness no fatigue HEAD: Normocephalic, atraumatic.  EYES: Pupils equal, round, reactive to light. Extraocular muscles intact.  No scleral icterus.  MOUTH: Moist mucosal membrane. Dentition intact. No abscess noted.  EAR, NOSE, THROAT: Clear without exudates. No external lesions.  NECK: Supple. No thyromegaly. No nodules. No JVD.  PULMONARY: mild rhonchi bilaterally  CARDIOVASCULAR: S1 and S2. Regular rate and rhythm. No murmurs, rubs, or gallops. No edema. Pedal pulses 2+ bilaterally.  GASTROINTESTINAL: Soft, nontender, nondistended. No masses. Positive bowel sounds. No hepatosplenomegaly.  MUSCULOSKELETAL: No swelling, clubbing, or edema. Range of motion full in all extremities.  NEUROLOGIC: Cranial nerves II through XII are intact. No gross focal neurological deficits. Sensation intact. Reflexes intact.  SKIN: No ulceration, lesions, rashes, or cyanosis. Skin warm and dry. Turgor intact.  PSYCHIATRIC: Mood, affect within normal limits. The patient is awake, alert and oriented x 3. Insight, judgment intact.       IMAGING    CT Angio Chest PE W/Cm &/Or Wo Cm  Result Date: 06/02/2021 CLINICAL DATA:  Chest and abdomen pain. EXAM: CT ANGIOGRAPHY CHEST CT ABDOMEN AND PELVIS WITH CONTRAST TECHNIQUE: Multidetector CT imaging of the chest was performed using the standard protocol during bolus administration of intravenous contrast. Multiplanar CT image reconstructions and MIPs were obtained to evaluate the vascular anatomy.  Multidetector CT imaging of the abdomen and pelvis was performed using the standard protocol during bolus administration of intravenous contrast. RADIATION DOSE REDUCTION: This exam was performed according to the departmental dose-optimization program which includes automated exposure control, adjustment of the mA and/or kV according to patient size and/or use of iterative reconstruction technique. CONTRAST:  75mL OMNIPAQUE IOHEXOL 350 MG/ML SOLN COMPARISON:  Abdomen and pelvis CT with contrast 12/11/2020, CT chest, abdomen pelvis with contrast 12/20/2017, CTA chest 04/17/2017 FINDINGS: CTA CHEST FINDINGS Cardiovascular: Pulmonary arteries are upper limit of normal in caliber. Nonoccluding linear thrombotic filling defect is seen in right upper lobe segmental and 1 or possibly 2 subsegmental upstream arteries to the apical area and is also seen in 3 right lower lobe segmental and several downstream subsegmental arteries. Overall clot burden is small without findings of acute right heart strain. There is cardiomegaly with left chamber predominance, three-vessel calcific CAD, and thinning and fatty infiltration in the apical myocardium again noted consistent with a prior apical infarct. No pericardial effusion is seen. No other pulmonary embolus is seen. Superior pulmonary veins are slightly prominent but no more than previously. There are calcifications of the mitral ring, and heavy aortic calcific atherosclerosis,without aortic aneurysm, penetrating ulcer or dissection. There is atherosclerosis in the great vessels without flow-limiting stenosis. Mediastinum/Nodes: Axillary spaces are clear. The thyroid gland is unremarkable. There is interval increased diffuse thickening and patulous Ness of the esophagus. Small hiatal hernia. Findings may suggest diffuse esophagitis. Endoscopic follow-up may be indicated. There are increasingly mildly enlarged lymph nodes in the AP window, precarinal and subcarinal mediastinum,  and mildly prominent lymph nodes in the lower right hilum. No tracheal lesion or fluid is seen. The central airways are clear and normal caliber. There is no bulky or encasing adenopathy. Lungs/Pleura: A moderate-to-large right pleural effusion is again demonstrated, similar to 12/11/2020 but was not seen in 2019. There is a small layering left pleural effusion which is new from both prior studies. Increased subpleural interstitial markings in the lung bases and apices are noted probably due to interstitial edema. In the right lower lobe anterior basal area there is fluid and debris in the area segmental bronchi and multiple downstream small bronchi. A confluent opacity extending from the hilum to the anterolateral pleural surface has developed in  this segment measuring 6.3 by 3.0 cm on series 3 axial 79 with only scant air bronchograms only in its medial aspect. The area of consolidation contains an air-fluid level measuring 1.6 x 1.3 cm on axial 75 which is worrisome for a small pulmonary abscess with possibly necrotizing pneumonic process. Underlying mass is difficult to exclude but should not have reached this size in 6 months. There are additional scattered ground-glass opacities in the more central lungs bilaterally and in the peripheral partially aerated right lower lobe, which could be ground-glass edema, pneumonitis or combination. There is compressive atelectasis along side the right pleural effusion. Musculoskeletal: There is dextroscoliosis and multilevel bridging enthesopathy of the thoracic spine. No acute or significant osseous findings. Review of the MIP images confirms the above findings. CT ABDOMEN and PELVIS FINDINGS Hepatobiliary: As previously the liver enhancement is heterogeneous initially but homogeneous on delayed phase compatible with perfusional differences. There is no mass enhancement. Post cholecystectomy intrahepatic and extrahepatic biliary dilatation is seen with common bile duct 1.8  cm, previously 1.6 cm. There is no appreciable ductal stone no increased prominence in the pre-existing intrahepatic biliary dilatation. Pancreas: Atrophic without appreciable mass or ductal dilatation. Spleen: Normal in size and enhancement. Adrenals/Urinary Tract: There is no adrenal mass. There is scattered cortical scarring of the kidneys without visible cortical mass. There is no evidence of urinary stone or obstruction. No focal bladder abnormality. Stomach/Bowel: Small hiatal hernia. Chronic thickened folds in the stomach unchanged. No bowel obstruction or inflammation. An appendix is not seen. There is mild fecal stasis, without evidence of colitis/diverticulitis. Vascular/Lymphatic: Heavy extensive calcification in the aorta, iliac and visceral branch arteries is again noted. There is no AAA. Major branch vessels appear patent. The portal and splenic veins are patent and normal caliber. There is no adenopathy. Reproductive: Surgically absent uterus.  No adnexal mass. Other: There is body wall anasarca. There is trace ascites in the pelvis. Musculoskeletal: There is advanced degenerative change of the lumbar spine and chronic wedging of the T12 and L1 vertebrae, multilevel acquired spinal canal and foraminal stenosis lumbar spine. No acute regional skeletal findings. Mild-to-moderate hip DJD. Review of the MIP images confirms the above findings. IMPRESSION: 1. Nonoccluding linear thrombus in segmental and subsegmental arteries in the right upper and lower lobes, overall small clot burden common, no findings of acute right heart strain. 2. 6.3 x 3.0 cm confluent opacity from the hilum to the pleural surface, anterior basal segment right lower lobe. There are mildly enlarged mediastinal and hilar lymph nodes which could be reactive but are nonspecific. 3. Underlying mass difficult to exclude but this is more likely a necrotizing pneumonic process and contains a small 1.6 cm air-fluid level which is probably an  abscess. There is mucoid debris or fluid opacifying segmental and downstream subsegmental bronchi in the area. 4. Cardiomegaly, calcific CAD, aortic atherosclerosis and chronic apical left ventricular infarct. 5. Evidence of at least mild interstitial edema, moderate-to-large left right pleural effusion which was seen on 12/11/2020, and new small left pleural effusion with increased body wall edema. 6. Additional ground-glass opacities which could indicate additional edema and or pneumonitis. 7. Again noted intrahepatic and extrahepatic postcholecystectomy biliary dilatation. It is not significantly changed since 12/11/2020 but has increased since 2019. Laboratory and clinical correlation advised. MRCP or ERCP may be helpful if warranted. 8. Diffuse increased esophageal thickening consistent with esophagitis with increased patulous esophagus appearance. Small hiatal hernia. Consider endoscopic follow-up if warranted. 9. Minimal pelvic ascites and remaining findings discussed  above. 10. Discussed over the phone with Dr. Dolores Frame at 3:50 a.m., 06/02/2021. Electronically Signed   By: Almira Bar M.D.   On: 06/02/2021 03:51   CT Abdomen Pelvis W Contrast  Result Date: 06/02/2021 CLINICAL DATA:  Chest and abdomen pain. EXAM: CT ANGIOGRAPHY CHEST CT ABDOMEN AND PELVIS WITH CONTRAST TECHNIQUE: Multidetector CT imaging of the chest was performed using the standard protocol during bolus administration of intravenous contrast. Multiplanar CT image reconstructions and MIPs were obtained to evaluate the vascular anatomy. Multidetector CT imaging of the abdomen and pelvis was performed using the standard protocol during bolus administration of intravenous contrast. RADIATION DOSE REDUCTION: This exam was performed according to the departmental dose-optimization program which includes automated exposure control, adjustment of the mA and/or kV according to patient size and/or use of iterative reconstruction technique. CONTRAST:   55mL OMNIPAQUE IOHEXOL 350 MG/ML SOLN COMPARISON:  Abdomen and pelvis CT with contrast 12/11/2020, CT chest, abdomen pelvis with contrast 12/20/2017, CTA chest 04/17/2017 FINDINGS: CTA CHEST FINDINGS Cardiovascular: Pulmonary arteries are upper limit of normal in caliber. Nonoccluding linear thrombotic filling defect is seen in right upper lobe segmental and 1 or possibly 2 subsegmental upstream arteries to the apical area and is also seen in 3 right lower lobe segmental and several downstream subsegmental arteries. Overall clot burden is small without findings of acute right heart strain. There is cardiomegaly with left chamber predominance, three-vessel calcific CAD, and thinning and fatty infiltration in the apical myocardium again noted consistent with a prior apical infarct. No pericardial effusion is seen. No other pulmonary embolus is seen. Superior pulmonary veins are slightly prominent but no more than previously. There are calcifications of the mitral ring, and heavy aortic calcific atherosclerosis,without aortic aneurysm, penetrating ulcer or dissection. There is atherosclerosis in the great vessels without flow-limiting stenosis. Mediastinum/Nodes: Axillary spaces are clear. The thyroid gland is unremarkable. There is interval increased diffuse thickening and patulous Ness of the esophagus. Small hiatal hernia. Findings may suggest diffuse esophagitis. Endoscopic follow-up may be indicated. There are increasingly mildly enlarged lymph nodes in the AP window, precarinal and subcarinal mediastinum, and mildly prominent lymph nodes in the lower right hilum. No tracheal lesion or fluid is seen. The central airways are clear and normal caliber. There is no bulky or encasing adenopathy. Lungs/Pleura: A moderate-to-large right pleural effusion is again demonstrated, similar to 12/11/2020 but was not seen in 2019. There is a small layering left pleural effusion which is new from both prior studies. Increased  subpleural interstitial markings in the lung bases and apices are noted probably due to interstitial edema. In the right lower lobe anterior basal area there is fluid and debris in the area segmental bronchi and multiple downstream small bronchi. A confluent opacity extending from the hilum to the anterolateral pleural surface has developed in this segment measuring 6.3 by 3.0 cm on series 3 axial 79 with only scant air bronchograms only in its medial aspect. The area of consolidation contains an air-fluid level measuring 1.6 x 1.3 cm on axial 75 which is worrisome for a small pulmonary abscess with possibly necrotizing pneumonic process. Underlying mass is difficult to exclude but should not have reached this size in 6 months. There are additional scattered ground-glass opacities in the more central lungs bilaterally and in the peripheral partially aerated right lower lobe, which could be ground-glass edema, pneumonitis or combination. There is compressive atelectasis along side the right pleural effusion. Musculoskeletal: There is dextroscoliosis and multilevel bridging enthesopathy of the thoracic spine.  No acute or significant osseous findings. Review of the MIP images confirms the above findings. CT ABDOMEN and PELVIS FINDINGS Hepatobiliary: As previously the liver enhancement is heterogeneous initially but homogeneous on delayed phase compatible with perfusional differences. There is no mass enhancement. Post cholecystectomy intrahepatic and extrahepatic biliary dilatation is seen with common bile duct 1.8 cm, previously 1.6 cm. There is no appreciable ductal stone no increased prominence in the pre-existing intrahepatic biliary dilatation. Pancreas: Atrophic without appreciable mass or ductal dilatation. Spleen: Normal in size and enhancement. Adrenals/Urinary Tract: There is no adrenal mass. There is scattered cortical scarring of the kidneys without visible cortical mass. There is no evidence of urinary  stone or obstruction. No focal bladder abnormality. Stomach/Bowel: Small hiatal hernia. Chronic thickened folds in the stomach unchanged. No bowel obstruction or inflammation. An appendix is not seen. There is mild fecal stasis, without evidence of colitis/diverticulitis. Vascular/Lymphatic: Heavy extensive calcification in the aorta, iliac and visceral branch arteries is again noted. There is no AAA. Major branch vessels appear patent. The portal and splenic veins are patent and normal caliber. There is no adenopathy. Reproductive: Surgically absent uterus.  No adnexal mass. Other: There is body wall anasarca. There is trace ascites in the pelvis. Musculoskeletal: There is advanced degenerative change of the lumbar spine and chronic wedging of the T12 and L1 vertebrae, multilevel acquired spinal canal and foraminal stenosis lumbar spine. No acute regional skeletal findings. Mild-to-moderate hip DJD. Review of the MIP images confirms the above findings. IMPRESSION: 1. Nonoccluding linear thrombus in segmental and subsegmental arteries in the right upper and lower lobes, overall small clot burden common, no findings of acute right heart strain. 2. 6.3 x 3.0 cm confluent opacity from the hilum to the pleural surface, anterior basal segment right lower lobe. There are mildly enlarged mediastinal and hilar lymph nodes which could be reactive but are nonspecific. 3. Underlying mass difficult to exclude but this is more likely a necrotizing pneumonic process and contains a small 1.6 cm air-fluid level which is probably an abscess. There is mucoid debris or fluid opacifying segmental and downstream subsegmental bronchi in the area. 4. Cardiomegaly, calcific CAD, aortic atherosclerosis and chronic apical left ventricular infarct. 5. Evidence of at least mild interstitial edema, moderate-to-large left right pleural effusion which was seen on 12/11/2020, and new small left pleural effusion with increased body wall edema. 6.  Additional ground-glass opacities which could indicate additional edema and or pneumonitis. 7. Again noted intrahepatic and extrahepatic postcholecystectomy biliary dilatation. It is not significantly changed since 12/11/2020 but has increased since 2019. Laboratory and clinical correlation advised. MRCP or ERCP may be helpful if warranted. 8. Diffuse increased esophageal thickening consistent with esophagitis with increased patulous esophagus appearance. Small hiatal hernia. Consider endoscopic follow-up if warranted. 9. Minimal pelvic ascites and remaining findings discussed above. 10. Discussed over the phone with Dr. Dolores FrameSung at 3:50 a.m., 06/02/2021. Electronically Signed   By: Almira BarKeith  Chesser M.D.   On: 06/02/2021 03:51   DG Chest Port 1 View  Result Date: 06/02/2021 CLINICAL DATA:  Status post RIGHT thoracentesis EXAM: PORTABLE CHEST 1 VIEW COMPARISON:  Radiograph 06/01/2021, CT 06/02/2021 FINDINGS: Reduction in RIGHT pleural fluid following thoracentesis. Masslike consolidation remains in the RIGHT midlung. No pneumothorax. LEFT lung clear. IMPRESSION: 1. No pneumothorax following RIGHT thoracentesis. 2. Decrease in RIGHT pleural fluid volume. Electronically Signed   By: Genevive BiStewart  Edmunds M.D.   On: 06/02/2021 15:23   DG Chest Port 1 View  Result Date: 06/01/2021 CLINICAL DATA:  Mid chest pain. EXAM: PORTABLE CHEST 1 VIEW COMPARISON:  April 10, 2021 FINDINGS: Mild, diffusely increased interstitial lung markings are seen with mild prominence of the pulmonary vasculature. There is mild right suprahilar and infrahilar peribronchial cuffing with marked severity airspace disease seen within the mid right lung. There is a small right pleural effusion. Elevation of the right hemidiaphragm is noted. No pneumothorax is identified. The heart size and mediastinal contours are within normal limits. There is marked severity calcification of the aortic arch. Degenerative changes seen throughout the thoracic spine.  IMPRESSION: 1. Marked severity infiltrate within the mid right lung, with a superimposed component of congestive heart failure. 2. Small right pleural effusion. Electronically Signed   By: Aram Candela M.D.   On: 06/01/2021 23:43   DG Foot Complete Right  Result Date: 06/02/2021 CLINICAL DATA:  Right foot third toe injury 04/05/2021. EXAM: RIGHT FOOT COMPLETE - 3+ VIEW COMPARISON:  Right foot plain films 04/10/2021. FINDINGS: Osteopenia. No fracture or erosive lesion is seen. Hammertoe deformities limited assessment of the phalanges. There was previously disproportionate soft tissue swelling of third toe which is no longer seen. There are vascular calcifications in the distal foreleg. Hallux valgus and advanced first MTP joint DJD are again shown, with osteophytes along the articulation of the hallux head with the hallux sesamoid ossicles. There is additional joint narrowing and osteophytes of the tarsometatarsal and tarsal tarsal joints, moderate nonerosive plantar heel spur and a small nonerosive posterior calcaneal spur. IMPRESSION: 1. Limited assessment of the phalanges due to hammertoe deformities. No acute abnormality within study limitations. 2. Disproportionate swelling previously was noted in the third toe but not seen today. 3. Osteopenia and degenerative change, chronic hallux valgus. 4. Vascular calcifications in the distal foreleg. Electronically Signed   By: Almira Bar M.D.   On: 06/02/2021 01:27   US THORACENTESIS ASP PLEURAL SPACE W/IMG GUIDE  Result Date: 06/02/2021 INDICATION: Patient with a history of heart failure presents today with right pleural effusion. Interventional radiology asked to perform a diagnostic and therapeutic thoracentesis. EXAM: ULTRASOUND GUIDED THORACENTESIS MEDICATIONS: 1% lidocaine 10 mL COMPLICATIONS: None immediate. PROCEDURE: An ultrasound guided thoracentesis was thoroughly discussed with the patient and questions answered. The benefits, risks,  alternatives and complications were also discussed. The patient understands and wishes to proceed with the procedure. Written consent was obtained. Ultrasound was performed to localize and mark an adequate pocket of fluid in the right chest. The area was then prepped and draped in the normal sterile fashion. 1% Lidocaine was used for local anesthesia. Under ultrasound guidance a 6 Fr Safe-T-Centesis catheter was introduced. Thoracentesis was performed. The catheter was removed and a dressing applied. FINDINGS: A total of approximately 1.2 L of clear yellow fluid was removed. Samples were sent to the laboratory as requested by the clinical team. IMPRESSION: Successful ultrasound guided right thoracentesis yielding 1.2 of pleural fluid. Read by: Alwyn Ren, NP Electronically Signed   By: Olive Bass M.D.   On: 06/02/2021 15:52      ASSESSMENT/PLAN   Right lung consolidated pneumonia Recent COVID 19 infection 04/10/21 -this may be post viral LRTI bacterial pneuomonia vs malignancy -paitent on vancomycin and Unasyn currently  - MRSA pcr  -cytology from thoracentesis is pending -strep pneumonand legionella negative -possible aspiratoin due to mucoid debris or fluid opacifying segmental RLL    Right pleural effusion   - neutrophil predominant speciment with other labs still in process  -s/p 1.2L fluid removed.  Patient has at least 1 life-threatening  severe medical condition which is being evaluated managed during this patient visit   Non-massive PE -Nonoccluding linear thrombus in segmental and subsegmental arteries in the right upper and lower lobes -on heparin currently may transition to eliquis when appropriate    Hiatal hernia with esophagitis and mucosal thickening    GI consultation   Thank you for allowing me to participate in the care of this patient.   Patient/Family are satisfied with care plan and all questions have been answered.  This document was prepared using Dragon  voice recognition software and may include unintentional dictation errors.     Vida Rigger, M.D.  Division of Pulmonary & Critical Care Medicine  [facility]

## 2021-06-03 NOTE — Assessment & Plan Note (Addendum)
Patient seen by pulmonology.  Completed course of antibiotics. Blood culture: 1/4 bottle blood culture positive for Corynebacterium.  This is a contaminant.

## 2021-06-03 NOTE — Progress Notes (Signed)
Subjective: ?86 year old female with past medical history significant for CHF, bipolar, history of CVA presented with concerns of chest pain.  Podiatrist consulted for right foot ulcer, infection. ? ? ?Objective: ?NAD- laying in bed ?Unable to palpate pulses ?Wound, gangrenous changes present distal aspect of right third toe.  There is also superficial breakdown of the right fourth toe lateral aspect and likely irritation from the third toe swelling.  There is no drainage or pus identified as having no ascending cellulitis. ? ? ? ? ? ?Assessment: ?Right third toe osteomyelitis, PAD ? ?Plan: ?Duplex reviewed which revealed superficial femoral-popliteal arteries with monophasic waveforms with proximal disease as well as anterior tibial artery occluded and decreased waveforms in the posterior tibial artery.  Recommend vascular surgery consult.  Likely to have amputation of the toe if medically stable but will await vascular surgery evaluation.  Podiatry will continue to follow. ? ?Ovid Curd, DPM ? ?

## 2021-06-03 NOTE — Progress Notes (Signed)
PHARMACY - PHYSICIAN COMMUNICATION ?CRITICAL VALUE ALERT - BLOOD CULTURE IDENTIFICATION (BCID) ? ?Jenna Dudley is an 86 y.o. female who presented to [facility] on 06/01/2021 with a chief complaint of chest pain ? ?Assessment:  blood cultures from 3/8 with GPR in 1 of 4 bottles.   No BCID performed.  Concern for possible necrotizing pneumonia w/ abscess.  Possible right foot infection.  ? ?Name of physician (or Provider) Contacted: Dr Roosevelt Locks ? ?Current antibiotics: vancomycin and ampicillin/sulbactam ? ?Changes to prescribed antibiotics recommended:  ?Patient is on recommended antibiotics - No changes needed ?Suspect may be contaminant but current antibiotic therapy would likely be active ? ?No results found for this or any previous visit. ? ?Doreene Eland, PharmD, BCPS, BCIDP ?Work Cell: [phone] ?06/03/2021 9:51 AM ? ? ? ?

## 2021-06-03 NOTE — Progress Notes (Addendum)
Speech Language Pathology Treatment: Dysphagia  ?Patient Details ?Name: Jenna Dudley ?MRN: [id number] ?DOB: 1935/09/02 ?Today's Date: 06/03/2021 ?Time: 6767-2094 ?SLP Time Calculation (min) (ACUTE ONLY): 35 min ? ?Assessment / Plan / Recommendation ?Clinical Impression ? Pt seen for ongoing assessment of swallowing. She was resting in bed; family/Dtrs present. Pt awake, min verbally responsive w/ min engagement in conversation and po trials w/ SLP. She stated her stomach "hurt"; family stated NSG aware and had given her medication recently. Pt on [place] O2; poor/few Dentition status.  ? ?Pt explained general aspiration precautions; education given to family present in room who agreed verbally it was important to sit fully upright for all oral intake -- supported pt behind the back for full upright sitting for the trials given. Pt assisted w/ positioning d/t min weakness. Discussed further general aspiration precautions w/ family including small bites/sips; NO USE OF STRAWS (updated). Family agreed. Precautions posted in room.  ? ?Pt fed herself trials of thin liquids initially via Straw (family stated it was preferred) w/ Overt clinical s/s of aspiration noted: immediate, continued mild coughing post trials(pt did take multiple sips).  After a brief rest, pt was instructed to take single sips via CUP -- NO overt clinical s/s of aspiration were noted(even w/ multiple sips taken by pt). Clear vocal quality and no decline in respiratory effort/status during/post trials. Oral phase appeared wfl for bolus management and timely A-P transfer w/ immediate swallowing and oral clearing.  ?Discussed w/ pt and family that the CUP drinking may be offering pt increased controlled w/ sipping. Family agreed. ? ?Upon offering and encouraging bites of foods to upgrade diet consistency, pt consistently declined stating "NO".  ?  ? ?Per discussion w/ MD, pt is at increased risk for aspiration/aspiration PNA given acuity of illness and  extended illness including Depression and poor oral intake per chart notes, dental status, Baseline Cognitive decline/Dementia per chart notes, h/o reduced oral intake and current report of by family in room(also see PCP note in 09/2020), and multiple medical comorbidities.  ?The PCP note in 09/2020 stated: "Husband brought pt in complaining that she is not eating for over a week. He states that she has bipolar 1 disorder and major depression and is just gotten fearful of food and will not eat much. He is just had a day or 2 where she would eat something and drink a few liquids and then nothing for a while.".  Husband said in that note that they had not seen Dr. Toni Amend recently. Children/Family in room stated pt often "did not eat at home" prior to this admit.   ?Pt also has a Significant Baseline of GERD/esophageal issues(see Imaging including CT of Abd. 06/02/2021 indicated: "Diffuse increased esophageal thickening consistent with  esophagitis with increased patulous esophagus appearance."; Imaging at [facility] indicating a "Moderate size Hiatal Hernia in 11/2020). ANY retrograde backflow or regurgitation of REFLUX can increase risk for aspiration of REFLUX material thus pneumonia. Pt is on a PPI currently per chart.  ?  ?Recommend continue a Pureed diet with thin liquids VIA CUP; general swallowing strategies/aspiration precautions and REFLUX precautions. Pills Whole in Puree; tray setup and positioning assistance for meals to best support pt being able to feed self. SLP to f/u per POC for diet tolerance and trial of upgraded textures, as appropriate next 2-3 days.  ?  ?Given concern for suspected Esophageal phase dysphagia given results of CTA chest/abd, pt would benefit from formal GI consult for assessment of Esophageal motility. Recommend Dietician  f/u for nutritional support. MD updated, agreed.  ?  ? ? ? ? ?  ?HPI HPI: Per Physician's H&P: pt is a pleasant 86 y.o. female with medical history significant for Delirium  w/ Dementia per chart, chronic diastolic CHF, GERD, Bipolar Disorder, history of CVA, and right third toe wound, now presenting to the emergency department for evaluation of chest pain.  Patient reported that she has been experiencing increased pain at her right third toe for which she was taking Tylenol 3, but then developed chest pain last night.  She has difficulty providing any further information about her symptoms and tends to reply, "I do not know" to any direct questioning.  IV Ativan was administered in the emergency department but the patient was reportedly having difficulty providing history prior to that." CTA chest completed - CTA chest reveals segmental and subsegmental PE involving right upper and right lower lobes without right heart strain.  Also noted on CT chest is right-sided opacity with air-fluid level that likely reflects necrotizing pneumonia with abscess.  There are additional groundglass opacities on CT suspected to represent edema.  Also notable on imaging was patulous esophagus, fluid and debris in the right lower lobe bronchi, and pleural effusions. CT of Abd. 06/02/2021 indicated: "Diffuse increased esophageal thickening consistent with  esophagitis with increased patulous esophagus appearance. Small  hiatal hernia.".  Pt has had a previous thoracentesis at a previous admit 11/2020; and at this admit. ?  ?   ?SLP Plan ? Continue with current plan of care ? ?  ?  ?Recommendations for follow up therapy are one component of a multi-disciplinary discharge planning process, led by the attending physician.  Recommendations may be updated based on patient status, additional functional criteria and insurance authorization. ?  ? ?Recommendations  ?Diet recommendations: Dysphagia 1 (puree);Thin liquid ?Liquids provided via: Cup;No straw ?Medication Administration: Crushed with puree ?Supervision: Patient able to self feed;Staff to assist with self feeding;Intermittent supervision to cue for  compensatory strategies ?Compensations: Minimize environmental distractions;Slow rate;Small sips/bites;Lingual sweep for clearance of pocketing;Follow solids with liquid ?Postural Changes and/or Swallow Maneuvers: Out of bed for meals;Seated upright 90 degrees;Upright 30-60 min after meal (REFLUX precautions)  ?   ?    ?   ? ? ? ? General recommendations:  (Dietician f/u; Palliative care f/u for GOC in light of pt's Baseline status) ?Oral Care Recommendations: Oral care BID;Oral care before and after PO;Staff/trained caregiver to provide oral care ?Follow Up Recommendations: Follow physician's recommendations for discharge plan and follow up therapies ?Assistance recommended at discharge: Frequent or constant Supervision/Assistance (baseline) ?SLP Visit Diagnosis: Dysphagia, unspecified (R13.10) (pt declines to eat; Missing MOST Dentition) ?Plan: Continue with current plan of care ? ? ? ? ?  ?  ? ? ? ?Jerilynn Som, MS, CCC-SLP ?Speech Language Pathologist ?Rehab Services; [facility] ?[phone] (ascom) ?Jenna Dudley ? ?06/03/2021, 4:19 PM ?

## 2021-06-03 NOTE — Plan of Care (Signed)
Patient is alert but only to self. Becomes more agitated at night. Haldol given to attempt to complete MR of toe but was unsuccessful. Haldol seems to increase agitation-alternative needed. Daughter at bedside. Patient received 1U-PRBC. Will have H&H w/CBC. Complained of abdominal pain-called NP on call because med on MAR was out of appropriate pain level range. NP instructed to give tylenol. Patient had bowel movement. No additional needs voiced. ?Problem: Activity: ?Goal: Ability to tolerate increased activity will improve ?Outcome: Progressing ?  ?Problem: Clinical Measurements: ?Goal: Ability to maintain a body temperature in the normal range will improve ?Outcome: Progressing ?  ?Problem: Respiratory: ?Goal: Ability to maintain adequate ventilation will improve ?Outcome: Progressing ?Goal: Ability to maintain a clear airway will improve ?Outcome: Progressing ?  ?Problem: Health Behavior/Discharge Planning: ?Goal: Ability to manage health-related needs will improve ?Outcome: Progressing ?  ?Problem: Clinical Measurements: ?Goal: Ability to maintain clinical measurements within normal limits will improve ?Outcome: Progressing ?Goal: Will remain free from infection ?Outcome: Progressing ?Goal: Diagnostic test results will improve ?Outcome: Progressing ?Goal: Respiratory complications will improve ?Outcome: Progressing ?Goal: Cardiovascular complication will be avoided ?Outcome: Progressing ?  ?Problem: Activity: ?Goal: Risk for activity intolerance will decrease ?Outcome: Progressing ?  ?Problem: Nutrition: ?Goal: Adequate nutrition will be maintained ?Outcome: Progressing ?  ?Problem: Elimination: ?Goal: Will not experience complications related to bowel motility ?Outcome: Progressing ?Goal: Will not experience complications related to urinary retention ?Outcome: Progressing ?  ?Problem: Pain Managment: ?Goal: General experience of comfort will improve ?Outcome: Progressing ?  ?Problem: Safety: ?Goal: Ability to  remain free from injury will improve ?Outcome: Progressing ?  ?Problem: Skin Integrity: ?Goal: Risk for impaired skin integrity will decrease ?Outcome: Progressing ?  ?

## 2021-06-03 NOTE — Assessment & Plan Note (Addendum)
Condition has improved.

## 2021-06-04 DIAGNOSIS — R630 Anorexia: Secondary | ICD-10-CM | POA: Diagnosis not present

## 2021-06-04 DIAGNOSIS — M86672 Other chronic osteomyelitis, left ankle and foot: Secondary | ICD-10-CM | POA: Diagnosis not present

## 2021-06-04 DIAGNOSIS — I2699 Other pulmonary embolism without acute cor pulmonale: Secondary | ICD-10-CM | POA: Diagnosis not present

## 2021-06-04 DIAGNOSIS — J850 Gangrene and necrosis of lung: Secondary | ICD-10-CM | POA: Diagnosis not present

## 2021-06-04 DIAGNOSIS — J690 Pneumonitis due to inhalation of food and vomit: Secondary | ICD-10-CM | POA: Diagnosis not present

## 2021-06-04 LAB — BASIC METABOLIC PANEL
Anion gap: 9 (ref 5–15)
BUN: 14 mg/dL (ref 8–23)
CO2: 26 mmol/L (ref 22–32)
Calcium: 8.2 mg/dL — ABNORMAL LOW (ref 8.9–10.3)
Chloride: 105 mmol/L (ref 98–111)
Creatinine, Ser: 0.8 mg/dL (ref 0.44–1.00)
GFR, Estimated: >60 mL/min (ref >60)
Glucose, Bld: 98 mg/dL (ref 70–99)
Potassium: 3.4 mmol/L — ABNORMAL LOW (ref 3.5–5.1)
Sodium: 140 mmol/L (ref 135–145)

## 2021-06-04 LAB — URINE CULTURE: Culture: 40000 — AB

## 2021-06-04 LAB — CBC
HCT: 27.2 % — ABNORMAL LOW (ref 36.0–46.0)
Hemoglobin: 8.7 g/dL — ABNORMAL LOW (ref 12.0–15.0)
MCH: 34.8 pg — ABNORMAL HIGH (ref 26.0–34.0)
MCHC: 32 g/dL (ref 30.0–36.0)
MCV: 108.8 fL — ABNORMAL HIGH (ref 80.0–100.0)
Platelets: 243 10*3/uL (ref 150–400)
RBC: 2.5 MIL/uL — ABNORMAL LOW (ref 3.87–5.11)
RDW: 20.7 % — ABNORMAL HIGH (ref 11.5–15.5)
WBC: 5.6 10*3/uL (ref 4.0–10.5)
nRBC: 0 % (ref 0.0–0.2)

## 2021-06-04 LAB — HEPATIC FUNCTION PANEL
ALT: 8 U/L (ref 0–44)
AST: 15 U/L (ref 15–41)
Albumin: 2.6 g/dL — ABNORMAL LOW (ref 3.5–5.0)
Alkaline Phosphatase: 36 U/L — ABNORMAL LOW (ref 38–126)
Bilirubin, Direct: 0.2 mg/dL (ref 0.0–0.2)
Indirect Bilirubin: 0.5 mg/dL (ref 0.3–0.9)
Total Bilirubin: 0.7 mg/dL (ref 0.3–1.2)
Total Protein: 6.3 g/dL — ABNORMAL LOW (ref 6.5–8.1)

## 2021-06-04 LAB — MAGNESIUM: Magnesium: 2 mg/dL (ref 1.7–2.4)

## 2021-06-04 LAB — HEPARIN LEVEL (UNFRACTIONATED): Heparin Unfractionated: <0.1 IU/mL — ABNORMAL LOW (ref 0.30–0.70)

## 2021-06-04 MED ORDER — LACTATED RINGERS IV SOLN
INTRAVENOUS | Status: DC
  Filled 2021-06-04 (×6): qty 1000

## 2021-06-04 MED ORDER — KCL-LACTATED RINGERS 20 MEQ/L IV SOLN
INTRAVENOUS | Status: DC
Start: 2021-06-04 — End: 2021-06-04
  Filled 2021-06-04: qty 1000

## 2021-06-04 MED ORDER — VANCOMYCIN HCL IN DEXTROSE 1-5 GM/200ML-% IV SOLN
1000.0000 mg | INTRAVENOUS | Status: AC
  Administered 2021-06-04 – 2021-06-07 (×3): 1000 mg via INTRAVENOUS
  Filled 2021-06-04 (×3): qty 200

## 2021-06-04 MED ORDER — HEPARIN BOLUS VIA INFUSION
3000.0000 [IU] | Freq: Once | INTRAVENOUS | Status: AC
  Administered 2021-06-04: 3000 [IU] via INTRAVENOUS
  Filled 2021-06-04: qty 3000

## 2021-06-04 MED ORDER — HEPARIN (PORCINE) 25000 UT/250ML-% IV SOLN
1250.0000 [IU]/h | INTRAVENOUS | Status: DC
  Administered 2021-06-04: 13:00:00 1000 [IU]/h via INTRAVENOUS
  Administered 2021-06-05 – 2021-06-06 (×2): 1250 [IU]/h via INTRAVENOUS
  Filled 2021-06-04 (×3): qty 250

## 2021-06-04 MED ORDER — SODIUM CHLORIDE 0.9 % IV SOLN
INTRAVENOUS | Status: DC | PRN
Start: 2021-06-04 — End: 2021-06-14

## 2021-06-04 MED ORDER — POTASSIUM CHLORIDE 10 MEQ/100ML IV SOLN
10.0000 meq | Freq: Once | INTRAVENOUS | Status: AC
  Administered 2021-06-04: 10 meq via INTRAVENOUS
  Filled 2021-06-04: qty 100

## 2021-06-04 MED ORDER — HEPARIN BOLUS VIA INFUSION
1400.0000 [IU] | Freq: Once | INTRAVENOUS | Status: AC
  Administered 2021-06-04: 1400 [IU] via INTRAVENOUS
  Filled 2021-06-04: qty 1400

## 2021-06-04 MED ORDER — QUETIAPINE FUMARATE 25 MG PO TABS
25.0000 mg | ORAL_TABLET | Freq: Every day | ORAL | Status: DC
Start: 2021-06-04 — End: 2021-06-09
  Administered 2021-06-04 – 2021-06-08 (×5): 25 mg via ORAL
  Filled 2021-06-04 (×5): qty 1

## 2021-06-04 MED ORDER — MEGESTROL ACETATE 400 MG/10ML PO SUSP
200.0000 mg | Freq: Every day | ORAL | Status: DC
  Administered 2021-06-04 – 2021-06-05 (×2): 200 mg via ORAL
  Filled 2021-06-04 (×4): qty 10

## 2021-06-04 NOTE — Progress Notes (Addendum)
?Progress Note ? ? ?Patient: Jenna Dudley AUQ:[id number] DOB: [birth year] DOA: 06/01/2021     2 ?DOS: the patient was seen and examined on 06/04/2021 ?  ?Brief hospital course: ?AIZAH GEHLHAUSEN is a pleasant 86 y.o. female with medical history significant for chronic diastolic CHF, GERD, bipolar disorder, history of CVA, and right third toe wound, now presenting to the emergency department for evaluation of chest pain. ?In the hospital, she had heart rate of 114, resp irate 28, lactic acid 4.0.  CTA chest showed segmental PEs, right side opacity with a fluid level.  Also showed additional groundglass opacities, thickened esophagus wall.  And moderate to large right-sided pleural effusion.  Patient was given IV fluids, lactic acid level has dropped down to normal.  She is also started on antibiotics with Unasyn and vancomycin. ? ?Assessment and Plan: ?* Aspiration pneumonia ([place]) ?Patient is evaluated by speech therapy, currently on dysphagia 1 diet.  Continue Unasyn. ? ?Anorexia ?Patient has a very poor appetite, very little p.o. intake.  We will start some IV fluids to prevent dehydration.  Also add megestrol. ?Based on record, patient has not been eating well at home.  She has episodes of time that she does not eat for a week due to bipolar depression. ? 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 ?Patient also has iron deficiency, start oral supplement.  ? ?Pleural effusion on right ?Status post thoracentesis with removing 1.2 L.  Lab study showed 79% neutrophils, Gram stain and cultures pending.  Glucose high, protein low.  LDH 61.  Appears to be transudate. ? ?Hypomagnesemia ?Improved. ? ?Necrotizing pneumonia ([place]) ?Continue antibiotics for now.  No plan for bronchoscopy. ? ?Chest pain ?Secondary to PE. ? ?Elevated troponin ?Peak troponin was 108, dropped down to 94.  This is secondary to sepsis. ? ?Pulmonary emboli  ([place]) ?Patient hemoglobin is better, no evidence of acute bleeding.  Restart heparin drip. ? ?Chronic diastolic CHF (congestive heart failure) ([place]) ?Acute congestive heart failure ruled out. ?Patient does not seem to have any volume overload.  Continue to follow. ? ?Hypokalemia ?Improved. ? ?Toe osteomyelitis, left ([place]) ?Patient has been evaluated by podiatry, pending duplex ultrasound showed peripheral arterial disease, MRI showed third toe osteomyelitis.  Pending decision for toe amputation.  Continue vancomycin. ? ?Delirium with dementia ?Patient slept better last night, no additional agitation today.  Will change Seroquel to 25 mg every evening. ? ?Bipolar 1 disorder ([place]) ?Resume home medicines. ? ?Severe sepsis ([place]) ?Patient met severe sepsis criteria with tachycardia and tachypnea.  Lactic acid level of 4.0.  This is secondary to pneumonia. ?Condition has improved. ? ?History of stroke ?Continue to follow. ? ? ? ? ?  ? ?Subjective:  ?Patient slept better last night, she is less confused today, no agitation.  But she still has very poor appetite, very little p.o. intake.  She also complaining right upper quadrant abdominal pain, multiple loose stools.  Stool softener discontinued. ?She denies any short of breath or cough. ? ?Physical Exam: ?Vitals:  ? 06/03/21 2022 06/04/21 0438 06/04/21 0731 06/04/21 1134  ?BP: (!) 113/56 140/65 115/70 133/60  ?Pulse: 85 78 76 71  ?Resp: '20 16 16 18  ' ?Temp: 97.6 ?F (36.4 ?C) 97.6 ?F (36.4 ?C) 98 ?F (36.7 ?C) 98.2 ?F (36.8 ?C)  ?TempSrc:      ?SpO2: 95% 93% 95% 97%  ?Weight:      ?Height:      ? ?  General exam: Appears calm and comfortable  ?Respiratory system: Clear to auscultation. Respiratory effort normal. ?Cardiovascular system: S1 & S2 heard, RRR. No JVD, murmurs, rubs, gallops or clicks. No pedal edema. ?Gastrointestinal system: Abdomen is nondistended, soft and mild gastric tender. No organomegaly or masses felt. Normal bowel sounds heard. ?Central nervous system:  Alert and oriented x2. No focal neurological deficits. ?Extremities: Symmetric 5 x 5 power. ?Skin: No rashes, lesions or ulcers ? ?Data Reviewed: ?Reviewed MRI, ultrasound, all lab results. ? ?Family Communication: Husband updated at bedside. ? ?Disposition: ?Status is: Inpatient ?Remains inpatient appropriate because: Severity of disease, IV antibiotics. ? Planned Discharge Destination: Skilled nursing facility ? ? ? ?Time spent: 45 minutes ? ?Author: ?Sharen Hones, MD ?06/04/2021 12:48 PM ? ?For on call review [email or web address].  ?

## 2021-06-04 NOTE — Progress Notes (Signed)
PULMONOLOGY         Date: 06/04/2021,   MRN# [id number] Jenna Dudley 1936-01-25     AdmissionWeight: 51.6 kg                 CurrentWeight: 47.6 kg   Referring physician: Dr Roosevelt Locks   CHIEF COMPLAINT:   Abnormal CT chest imaging with pleural effusion status postthoracentesis   HISTORY OF PRESENT ILLNESS   This is a pleasant 86 year old female with a history of esophageal spasms and GERD, chronic depression, heart failure with preserved ejection fraction, bipolar disorder, right foot wounds, initially came to the emergency department with chest pain.  She reports chest pain is only been for 1 day.  She was found to have mild hypoxemia with SPO2 in the 90s on room air blood work showed anemia which is chronic and macrocytic as well as mild hypokalemia with a potassium of 3.1 she did have lactic acidosis with mild troponin elevation. She had CTPE done with bilateral PE noted. There is additional consolidated area of right lung which appears to be infiltrate consistent with pneumonia. She has not smoked in >40 years and does not have a history of cancer personally not family history of cancer. Fluid profile is PMN predominant thus far with additional studies pending. PCCM consultation for further evaluation and management.   06/03/21- patient is improved able to speak on phone with family. She is being optimized for dc home.  06/04/21- patient is on room air does not reports SOB. She reports pain in RUQ and this is tender to mild palpation. CBD dilation noted on CT abd this admission. Will obtain biliary profile/with LFTs.    PAST MEDICAL HISTORY   Past Medical History:  Diagnosis Date   Bilateral carpal tunnel syndrome 07/27/2014   Bipolar disorder ([place])    geropsych admission to [facility] in Dec 2018   Chronic diastolic CHF (congestive heart failure) ([place])    a. 03/2017 Echo: EF 55-60%, no rwma, Gr1 DD, mild MR; b. 06/2019 Echo: EF 55-60%, no rwma, mod LVH, GrII DD. Nl RV  size/fxn. RVSP 54.52mmHg. Mildly dil LA.   Chronic low back pain 07/29/2014   Degenerative arthritis of lumbar spine 07/27/2014   Dementia ([place])    Depression    Esophageal spasm 07/29/2014   Gastric reflux 09/17/2014   GERD (gastroesophageal reflux disease)    History of stress test    a. 04/2017 MV: low risk stress test.  EF 55-65%. Probable apical ant/apical, basal, and mid inflat attenuation artifact vs ischemia/scar.   Hyperlipemia 07/29/2014   Hypertension    Pneumonia    Spinal stenosis 07/29/2014     SURGICAL HISTORY   Past Surgical History:  Procedure Laterality Date   HIP SURGERY Right    INNER EAR SURGERY Right    REPLACEMENT TOTAL KNEE BILATERAL Bilateral 07/27/14     FAMILY HISTORY   Family History  Problem Relation Age of Onset   Hypertension Mother    Stroke Mother      SOCIAL HISTORY   Social History   Tobacco Use   Smoking status: Former    Types: Cigarettes    Quit date: 07/29/1975    Years since quitting: 45.8   Smokeless tobacco: Never  Vaping Use   Vaping Use: Never used  Substance Use Topics   Alcohol use: No    Alcohol/week: 0.0 standard drinks   Drug use: No     MEDICATIONS    Home Medication:  Current Medication:  Current Facility-Administered Medications:    0.9 %  sodium chloride infusion, , Intravenous, PRN, Marrion Coy, MD, Last Rate: 10 mL/hr at 06/04/21 0132, New Bag at 06/04/21 0132   acetaminophen (TYLENOL) tablet 650 mg, 650 mg, Oral, Q6H PRN, 650 mg at 06/04/21 0255 **OR** acetaminophen (TYLENOL) suppository 650 mg, 650 mg, Rectal, Q6H PRN, Opyd, Lavone Neri, MD   albuterol (PROVENTIL) (2.5 MG/3ML) 0.083% nebulizer solution 2.5 mg, 2.5 mg, Nebulization, Q6H PRN, Marrion Coy, MD   Ampicillin-Sulbactam (UNASYN) 3 g in sodium chloride 0.9 % 100 mL IVPB, 3 g, Intravenous, Q8H, Belue, Lendon Collar, RPH, Last Rate: 200 mL/hr at 06/04/21 0132, 3 g at 06/04/21 0132   cyanocobalamin ((VITAMIN B-12)) injection 1,000 mcg, 1,000 mcg,  Intramuscular, Daily, Marrion Coy, MD, 1,000 mcg at 06/03/21 0947   feeding supplement (BOOST / RESOURCE BREEZE) liquid 1 Container, 1 Container, Oral, TID BM, Marrion Coy, MD   furosemide (LASIX) tablet 40 mg, 40 mg, Oral, Daily, Marrion Coy, MD, 40 mg at 06/03/21 0941   heparin bolus via infusion 3,000 Units, 3,000 Units, Intravenous, Once **FOLLOWED BY** heparin ADULT infusion 100 units/mL (25000 units/280mL), 1,000 Units/hr, Intravenous, Continuous, Chappell, Alex B, RPH   lactated ringers with KCl 20 mEq/L infusion, , Intravenous, Continuous, Marrion Coy, MD   MEDLINE mouth rinse, 15 mL, Mouth Rinse, BID, Marrion Coy, MD, 15 mL at 06/04/21 0830   metoprolol tartrate (LOPRESSOR) tablet 25 mg, 25 mg, Oral, BID, Marrion Coy, MD, 25 mg at 06/03/21 0941   mirtazapine (REMERON) tablet 45 mg, 45 mg, Oral, QHS, Marrion Coy, MD, 45 mg at 06/02/21 2105   multivitamin with minerals tablet 1 tablet, 1 tablet, Oral, Daily, Marrion Coy, MD, 1 tablet at 06/03/21 1815   ondansetron (ZOFRAN) tablet 4 mg, 4 mg, Oral, Q6H PRN **OR** ondansetron (ZOFRAN) injection 4 mg, 4 mg, Intravenous, Q6H PRN, Opyd, Lavone Neri, MD   pantoprazole (PROTONIX) EC tablet 40 mg, 40 mg, Oral, Daily, Chipper Herb, Dekui, MD, 40 mg at 06/03/21 0941   polyethylene glycol (MIRALAX / GLYCOLAX) packet 17 g, 17 g, Oral, Daily PRN, Marrion Coy, MD   potassium chloride 10 mEq in 100 mL IVPB, 10 mEq, Intravenous, Once, Marrion Coy, MD   QUEtiapine (SEROQUEL) tablet 25 mg, 25 mg, Oral, BID, Marrion Coy, MD, 25 mg at 06/03/21 0941   senna-docusate (Senokot-S) tablet 1 tablet, 1 tablet, Oral, BID, Marrion Coy, MD, 1 tablet at 06/03/21 0941   simvastatin (ZOCOR) tablet 40 mg, 40 mg, Oral, QHS, Zhang, Freda Munro, MD, 40 mg at 06/02/21 2105   sodium chloride flush (NS) 0.9 % injection 3 mL, 3 mL, Intravenous, Q12H, Opyd, Timothy S, MD, 3 mL at 06/04/21 0831   sucralfate (CARAFATE) tablet 1 g, 1 g, Oral, TID WC & HS, Marrion Coy, MD, 1 g at 06/03/21  1714   vancomycin (VANCOREADY) IVPB 1250 mg/250 mL, 1,250 mg, Intravenous, Q36H, Otelia Sergeant, RPH, Last Rate: 166.7 mL/hr at 06/03/21 1111, 1,250 mg at 06/03/21 1111   venlafaxine XR (EFFEXOR-XR) 24 hr capsule 75 mg, 75 mg, Oral, Q breakfast, Marrion Coy, MD, 75 mg at 06/03/21 1610  Facility-Administered Medications Ordered in Other Encounters:    0.9 %  sodium chloride infusion, , , Continuous PRN, Lily Kocher, CRNA, Continued from Pre-op at 08/07/18 1110   lactated ringers infusion, , Intravenous, Continuous, Clapacs, Jackquline Denmark, MD, New Bag at 07/09/19 1014    ALLERGIES   Aspirin and Sulfa antibiotics     REVIEW OF SYSTEMS  Review of Systems:  Gen:  Denies  fever, sweats, chills weigh loss  HEENT: Denies blurred vision, double vision, ear pain, eye pain, hearing loss, nose bleeds, sore throat Cardiac:  No dizziness, chest pain or heaviness, chest tightness,edema Resp:   Denies cough or sputum porduction, shortness of breath,wheezing, hemoptysis,  Gi: Denies swallowing difficulty, stomach pain, nausea or vomiting, diarrhea, constipation, bowel incontinence Gu:  Denies bladder incontinence, burning urine Ext:   Denies Joint pain, stiffness or swelling Skin: Denies  skin rash, easy bruising or bleeding or hives Endoc:  Denies polyuria, polydipsia , polyphagia or weight change Psych:   Denies depression, insomnia or hallucinations   Other:  All other systems negative   VS: BP 115/70 (BP Location: Left Arm)    Pulse 76    Temp 98 F (36.7 C)    Resp 16    Ht 5' (1.524 m)    Wt 47.6 kg    LMP  (LMP Unknown)    SpO2 95%    BMI 20.51 kg/m      PHYSICAL EXAM    GENERAL:NAD, no fevers, chills, no weakness no fatigue HEAD: Normocephalic, atraumatic.  EYES: Pupils equal, round, reactive to light. Extraocular muscles intact. No scleral icterus.  MOUTH: Moist mucosal membrane. Dentition intact. No abscess noted.  EAR, NOSE, THROAT: Clear without exudates. No external  lesions.  NECK: Supple. No thyromegaly. No nodules. No JVD.  PULMONARY: mild rhonchi bilaterally  CARDIOVASCULAR: S1 and S2. Regular rate and rhythm. No murmurs, rubs, or gallops. No edema. Pedal pulses 2+ bilaterally.  GASTROINTESTINAL: Soft, nontender, nondistended. No masses. Positive bowel sounds. No hepatosplenomegaly.  MUSCULOSKELETAL: No swelling, clubbing, or edema. Range of motion full in all extremities.  NEUROLOGIC: Cranial nerves II through XII are intact. No gross focal neurological deficits. Sensation intact. Reflexes intact.  SKIN: No ulceration, lesions, rashes, or cyanosis. Skin warm and dry. Turgor intact.  PSYCHIATRIC: Mood, affect within normal limits. The patient is awake, alert and oriented x 3. Insight, judgment intact.       IMAGING    CT Angio Chest PE W/Cm &/Or Wo Cm  Result Date: 06/02/2021 CLINICAL DATA:  Chest and abdomen pain. EXAM: CT ANGIOGRAPHY CHEST CT ABDOMEN AND PELVIS WITH CONTRAST TECHNIQUE: Multidetector CT imaging of the chest was performed using the standard protocol during bolus administration of intravenous contrast. Multiplanar CT image reconstructions and MIPs were obtained to evaluate the vascular anatomy. Multidetector CT imaging of the abdomen and pelvis was performed using the standard protocol during bolus administration of intravenous contrast. RADIATION DOSE REDUCTION: This exam was performed according to the departmental dose-optimization program which includes automated exposure control, adjustment of the mA and/or kV according to patient size and/or use of iterative reconstruction technique. CONTRAST:  69mL OMNIPAQUE IOHEXOL 350 MG/ML SOLN COMPARISON:  Abdomen and pelvis CT with contrast 12/11/2020, CT chest, abdomen pelvis with contrast 12/20/2017, CTA chest 04/17/2017 FINDINGS: CTA CHEST FINDINGS Cardiovascular: Pulmonary arteries are upper limit of normal in caliber. Nonoccluding linear thrombotic filling defect is seen in right upper lobe  segmental and 1 or possibly 2 subsegmental upstream arteries to the apical area and is also seen in 3 right lower lobe segmental and several downstream subsegmental arteries. Overall clot burden is small without findings of acute right heart strain. There is cardiomegaly with left chamber predominance, three-vessel calcific CAD, and thinning and fatty infiltration in the apical myocardium again noted consistent with a prior apical infarct. No pericardial effusion is seen. No other pulmonary embolus is seen.  Superior pulmonary veins are slightly prominent but no more than previously. There are calcifications of the mitral ring, and heavy aortic calcific atherosclerosis,without aortic aneurysm, penetrating ulcer or dissection. There is atherosclerosis in the great vessels without flow-limiting stenosis. Mediastinum/Nodes: Axillary spaces are clear. The thyroid gland is unremarkable. There is interval increased diffuse thickening and patulous Ness of the esophagus. Small hiatal hernia. Findings may suggest diffuse esophagitis. Endoscopic follow-up may be indicated. There are increasingly mildly enlarged lymph nodes in the AP window, precarinal and subcarinal mediastinum, and mildly prominent lymph nodes in the lower right hilum. No tracheal lesion or fluid is seen. The central airways are clear and normal caliber. There is no bulky or encasing adenopathy. Lungs/Pleura: A moderate-to-large right pleural effusion is again demonstrated, similar to 12/11/2020 but was not seen in 2019. There is a small layering left pleural effusion which is new from both prior studies. Increased subpleural interstitial markings in the lung bases and apices are noted probably due to interstitial edema. In the right lower lobe anterior basal area there is fluid and debris in the area segmental bronchi and multiple downstream small bronchi. A confluent opacity extending from the hilum to the anterolateral pleural surface has developed in this  segment measuring 6.3 by 3.0 cm on series 3 axial 79 with only scant air bronchograms only in its medial aspect. The area of consolidation contains an air-fluid level measuring 1.6 x 1.3 cm on axial 75 which is worrisome for a small pulmonary abscess with possibly necrotizing pneumonic process. Underlying mass is difficult to exclude but should not have reached this size in 6 months. There are additional scattered ground-glass opacities in the more central lungs bilaterally and in the peripheral partially aerated right lower lobe, which could be ground-glass edema, pneumonitis or combination. There is compressive atelectasis along side the right pleural effusion. Musculoskeletal: There is dextroscoliosis and multilevel bridging enthesopathy of the thoracic spine. No acute or significant osseous findings. Review of the MIP images confirms the above findings. CT ABDOMEN and PELVIS FINDINGS Hepatobiliary: As previously the liver enhancement is heterogeneous initially but homogeneous on delayed phase compatible with perfusional differences. There is no mass enhancement. Post cholecystectomy intrahepatic and extrahepatic biliary dilatation is seen with common bile duct 1.8 cm, previously 1.6 cm. There is no appreciable ductal stone no increased prominence in the pre-existing intrahepatic biliary dilatation. Pancreas: Atrophic without appreciable mass or ductal dilatation. Spleen: Normal in size and enhancement. Adrenals/Urinary Tract: There is no adrenal mass. There is scattered cortical scarring of the kidneys without visible cortical mass. There is no evidence of urinary stone or obstruction. No focal bladder abnormality. Stomach/Bowel: Small hiatal hernia. Chronic thickened folds in the stomach unchanged. No bowel obstruction or inflammation. An appendix is not seen. There is mild fecal stasis, without evidence of colitis/diverticulitis. Vascular/Lymphatic: Heavy extensive calcification in the aorta, iliac and  visceral branch arteries is again noted. There is no AAA. Major branch vessels appear patent. The portal and splenic veins are patent and normal caliber. There is no adenopathy. Reproductive: Surgically absent uterus.  No adnexal mass. Other: There is body wall anasarca. There is trace ascites in the pelvis. Musculoskeletal: There is advanced degenerative change of the lumbar spine and chronic wedging of the T12 and L1 vertebrae, multilevel acquired spinal canal and foraminal stenosis lumbar spine. No acute regional skeletal findings. Mild-to-moderate hip DJD. Review of the MIP images confirms the above findings. IMPRESSION: 1. Nonoccluding linear thrombus in segmental and subsegmental arteries in the right upper and  lower lobes, overall small clot burden common, no findings of acute right heart strain. 2. 6.3 x 3.0 cm confluent opacity from the hilum to the pleural surface, anterior basal segment right lower lobe. There are mildly enlarged mediastinal and hilar lymph nodes which could be reactive but are nonspecific. 3. Underlying mass difficult to exclude but this is more likely a necrotizing pneumonic process and contains a small 1.6 cm air-fluid level which is probably an abscess. There is mucoid debris or fluid opacifying segmental and downstream subsegmental bronchi in the area. 4. Cardiomegaly, calcific CAD, aortic atherosclerosis and chronic apical left ventricular infarct. 5. Evidence of at least mild interstitial edema, moderate-to-large left right pleural effusion which was seen on 12/11/2020, and new small left pleural effusion with increased body wall edema. 6. Additional ground-glass opacities which could indicate additional edema and or pneumonitis. 7. Again noted intrahepatic and extrahepatic postcholecystectomy biliary dilatation. It is not significantly changed since 12/11/2020 but has increased since 2019. Laboratory and clinical correlation advised. MRCP or ERCP may be helpful if warranted. 8.  Diffuse increased esophageal thickening consistent with esophagitis with increased patulous esophagus appearance. Small hiatal hernia. Consider endoscopic follow-up if warranted. 9. Minimal pelvic ascites and remaining findings discussed above. 10. Discussed over the phone with Dr. Beather Arbour at 3:50 a.m., 06/02/2021. Electronically Signed   By: Telford Nab M.D.   On: 06/02/2021 03:51   CT Abdomen Pelvis W Contrast  Result Date: 06/02/2021 CLINICAL DATA:  Chest and abdomen pain. EXAM: CT ANGIOGRAPHY CHEST CT ABDOMEN AND PELVIS WITH CONTRAST TECHNIQUE: Multidetector CT imaging of the chest was performed using the standard protocol during bolus administration of intravenous contrast. Multiplanar CT image reconstructions and MIPs were obtained to evaluate the vascular anatomy. Multidetector CT imaging of the abdomen and pelvis was performed using the standard protocol during bolus administration of intravenous contrast. RADIATION DOSE REDUCTION: This exam was performed according to the departmental dose-optimization program which includes automated exposure control, adjustment of the mA and/or kV according to patient size and/or use of iterative reconstruction technique. CONTRAST:  36mL OMNIPAQUE IOHEXOL 350 MG/ML SOLN COMPARISON:  Abdomen and pelvis CT with contrast 12/11/2020, CT chest, abdomen pelvis with contrast 12/20/2017, CTA chest 04/17/2017 FINDINGS: CTA CHEST FINDINGS Cardiovascular: Pulmonary arteries are upper limit of normal in caliber. Nonoccluding linear thrombotic filling defect is seen in right upper lobe segmental and 1 or possibly 2 subsegmental upstream arteries to the apical area and is also seen in 3 right lower lobe segmental and several downstream subsegmental arteries. Overall clot burden is small without findings of acute right heart strain. There is cardiomegaly with left chamber predominance, three-vessel calcific CAD, and thinning and fatty infiltration in the apical myocardium again noted  consistent with a prior apical infarct. No pericardial effusion is seen. No other pulmonary embolus is seen. Superior pulmonary veins are slightly prominent but no more than previously. There are calcifications of the mitral ring, and heavy aortic calcific atherosclerosis,without aortic aneurysm, penetrating ulcer or dissection. There is atherosclerosis in the great vessels without flow-limiting stenosis. Mediastinum/Nodes: Axillary spaces are clear. The thyroid gland is unremarkable. There is interval increased diffuse thickening and patulous Ness of the esophagus. Small hiatal hernia. Findings may suggest diffuse esophagitis. Endoscopic follow-up may be indicated. There are increasingly mildly enlarged lymph nodes in the AP window, precarinal and subcarinal mediastinum, and mildly prominent lymph nodes in the lower right hilum. No tracheal lesion or fluid is seen. The central airways are clear and normal caliber. There is no bulky  or encasing adenopathy. Lungs/Pleura: A moderate-to-large right pleural effusion is again demonstrated, similar to 12/11/2020 but was not seen in 2019. There is a small layering left pleural effusion which is new from both prior studies. Increased subpleural interstitial markings in the lung bases and apices are noted probably due to interstitial edema. In the right lower lobe anterior basal area there is fluid and debris in the area segmental bronchi and multiple downstream small bronchi. A confluent opacity extending from the hilum to the anterolateral pleural surface has developed in this segment measuring 6.3 by 3.0 cm on series 3 axial 79 with only scant air bronchograms only in its medial aspect. The area of consolidation contains an air-fluid level measuring 1.6 x 1.3 cm on axial 75 which is worrisome for a small pulmonary abscess with possibly necrotizing pneumonic process. Underlying mass is difficult to exclude but should not have reached this size in 6 months. There are  additional scattered ground-glass opacities in the more central lungs bilaterally and in the peripheral partially aerated right lower lobe, which could be ground-glass edema, pneumonitis or combination. There is compressive atelectasis along side the right pleural effusion. Musculoskeletal: There is dextroscoliosis and multilevel bridging enthesopathy of the thoracic spine. No acute or significant osseous findings. Review of the MIP images confirms the above findings. CT ABDOMEN and PELVIS FINDINGS Hepatobiliary: As previously the liver enhancement is heterogeneous initially but homogeneous on delayed phase compatible with perfusional differences. There is no mass enhancement. Post cholecystectomy intrahepatic and extrahepatic biliary dilatation is seen with common bile duct 1.8 cm, previously 1.6 cm. There is no appreciable ductal stone no increased prominence in the pre-existing intrahepatic biliary dilatation. Pancreas: Atrophic without appreciable mass or ductal dilatation. Spleen: Normal in size and enhancement. Adrenals/Urinary Tract: There is no adrenal mass. There is scattered cortical scarring of the kidneys without visible cortical mass. There is no evidence of urinary stone or obstruction. No focal bladder abnormality. Stomach/Bowel: Small hiatal hernia. Chronic thickened folds in the stomach unchanged. No bowel obstruction or inflammation. An appendix is not seen. There is mild fecal stasis, without evidence of colitis/diverticulitis. Vascular/Lymphatic: Heavy extensive calcification in the aorta, iliac and visceral branch arteries is again noted. There is no AAA. Major branch vessels appear patent. The portal and splenic veins are patent and normal caliber. There is no adenopathy. Reproductive: Surgically absent uterus.  No adnexal mass. Other: There is body wall anasarca. There is trace ascites in the pelvis. Musculoskeletal: There is advanced degenerative change of the lumbar spine and chronic wedging  of the T12 and L1 vertebrae, multilevel acquired spinal canal and foraminal stenosis lumbar spine. No acute regional skeletal findings. Mild-to-moderate hip DJD. Review of the MIP images confirms the above findings. IMPRESSION: 1. Nonoccluding linear thrombus in segmental and subsegmental arteries in the right upper and lower lobes, overall small clot burden common, no findings of acute right heart strain. 2. 6.3 x 3.0 cm confluent opacity from the hilum to the pleural surface, anterior basal segment right lower lobe. There are mildly enlarged mediastinal and hilar lymph nodes which could be reactive but are nonspecific. 3. Underlying mass difficult to exclude but this is more likely a necrotizing pneumonic process and contains a small 1.6 cm air-fluid level which is probably an abscess. There is mucoid debris or fluid opacifying segmental and downstream subsegmental bronchi in the area. 4. Cardiomegaly, calcific CAD, aortic atherosclerosis and chronic apical left ventricular infarct. 5. Evidence of at least mild interstitial edema, moderate-to-large left right pleural effusion  which was seen on 12/11/2020, and new small left pleural effusion with increased body wall edema. 6. Additional ground-glass opacities which could indicate additional edema and or pneumonitis. 7. Again noted intrahepatic and extrahepatic postcholecystectomy biliary dilatation. It is not significantly changed since 12/11/2020 but has increased since 2019. Laboratory and clinical correlation advised. MRCP or ERCP may be helpful if warranted. 8. Diffuse increased esophageal thickening consistent with esophagitis with increased patulous esophagus appearance. Small hiatal hernia. Consider endoscopic follow-up if warranted. 9. Minimal pelvic ascites and remaining findings discussed above. 10. Discussed over the phone with Dr. Beather Arbour at 3:50 a.m., 06/02/2021. Electronically Signed   By: Telford Nab M.D.   On: 06/02/2021 03:51   MR TOES RIGHT WO  CONTRAST  Result Date: 06/03/2021 CLINICAL DATA:  Osteomyelitis of the third digit. EXAM: MRI OF THE RIGHT TOES WITHOUT CONTRAST TECHNIQUE: Multiplanar, multisequence MR imaging of the right forefoot was performed. No intravenous contrast was administered. COMPARISON:  None. FINDINGS: Bones/Joint/Cartilage Soft tissue wound along the plantar aspect of the third MTP joint. Severe bone marrow edema in the third metatarsal head, third proximal phalanx, third middle phalanx and third distal phalanx most consistent with osteomyelitis. Moderate osteoarthritis of the talonavicular joint. 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 Moderate osteoarthritis of the fourth tarsometatarsal joint. Moderate osteoarthritis of the first MTP joint. Severe osteoarthritic changes of the medial hallux sesamoid-moderate tarsal articulation. No joint effusion. Ligaments Collateral ligaments are intact.  Lisfranc ligament is intact. Muscles and Tendons Flexor, peroneal and extensor compartment tendons are intact. T2 hyperintense signal throughout the plantar musculature likely neurogenic. Soft tissue Fluid collection along the plantar aspect of the third metatarsal in the subcutaneous fat measuring 8 x 4 x 43 mm. No soft tissue mass. IMPRESSION: 1. Soft tissue wound along the plantar aspect of the third MTP joint. Severe bone marrow edema in the third metatarsal head, third proximal phalanx, third middle phalanx and third distal phalanx most consistent with osteomyelitis. 2. Fluid collection along the plantar aspect of the third metatarsal in the subcutaneous fat measuring 8 x 4 x 43 mm concerning for a small abscess. 3. 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 4. Moderate osteoarthritis of the talonavicular joint. 5. Moderate osteoarthritis of the first MTP joint. 6. Severe osteoarthritic changes of the medial hallux sesamoid-moderate tarsal articulation. Electronically Signed   By:  Kathreen Devoid M.D.   On: 06/03/2021 10:34   US ARTERIAL LOWER EXTREMITY DUPLEX RIGHT(NON-ABI)  Result Date: 06/03/2021 CLINICAL DATA:  Osteomyelitis, right third toe wound EXAM: RIGHT LOWER EXTREMITY ARTERIAL DUPLEX SCAN TECHNIQUE: Gray-scale sonography as well as color Doppler and duplex ultrasound was performed to evaluate the lower extremity arteries including the common, superficial and profunda femoral arteries, popliteal artery and calf arteries. COMPARISON:  None. FINDINGS: Right lower Extremity Inflow: Normal common femoral arterial waveforms and velocities. No evidence of inflow (aortoiliac) disease. Outflow: The superficial femoral artery and popliteal artery demonstrate dampened and monophasic waveforms. Runoff: The anterior tibial artery appears occluded. Dampened waveforms of the posterior tibial artery. IMPRESSION: 1. Normal right common femoral artery. 2. The superficial femoral and popliteal arteries demonstrate dampened and monophasic waveforms, suggestive of proximal disease although a culprit lesion is not definitively identified on this exam. 3. The anterior tibial artery appears occluded. Dampened waveforms of the posterior tibial artery. Electronically Signed   By: Albin Felling M.D.   On: 06/03/2021 12:46   DG Chest Port 1 View  Result Date: 06/02/2021 CLINICAL DATA:  Status  post RIGHT thoracentesis EXAM: PORTABLE CHEST 1 VIEW COMPARISON:  Radiograph 06/01/2021, CT 06/02/2021 FINDINGS: Reduction in RIGHT pleural fluid following thoracentesis. Masslike consolidation remains in the RIGHT midlung. No pneumothorax. LEFT lung clear. IMPRESSION: 1. No pneumothorax following RIGHT thoracentesis. 2. Decrease in RIGHT pleural fluid volume. Electronically Signed   By: Suzy Bouchard M.D.   On: 06/02/2021 15:23   DG Chest Port 1 View  Result Date: 06/01/2021 CLINICAL DATA:  Mid chest pain. EXAM: PORTABLE CHEST 1 VIEW COMPARISON:  April 10, 2021 FINDINGS: Mild, diffusely increased  interstitial lung markings are seen with mild prominence of the pulmonary vasculature. There is mild right suprahilar and infrahilar peribronchial cuffing with marked severity airspace disease seen within the mid right lung. There is a small right pleural effusion. Elevation of the right hemidiaphragm is noted. No pneumothorax is identified. The heart size and mediastinal contours are within normal limits. There is marked severity calcification of the aortic arch. Degenerative changes seen throughout the thoracic spine. IMPRESSION: 1. Marked severity infiltrate within the mid right lung, with a superimposed component of congestive heart failure. 2. Small right pleural effusion. Electronically Signed   By: Virgina Norfolk M.D.   On: 06/01/2021 23:43   DG Foot Complete Right  Result Date: 06/02/2021 CLINICAL DATA:  Right foot third toe injury 04/05/2021. EXAM: RIGHT FOOT COMPLETE - 3+ VIEW COMPARISON:  Right foot plain films 04/10/2021. FINDINGS: Osteopenia. No fracture or erosive lesion is seen. Hammertoe deformities limited assessment of the phalanges. There was previously disproportionate soft tissue swelling of third toe which is no longer seen. There are vascular calcifications in the distal foreleg. Hallux valgus and advanced first MTP joint DJD are again shown, with osteophytes along the articulation of the hallux head with the hallux sesamoid ossicles. There is additional joint narrowing and osteophytes of the tarsometatarsal and tarsal tarsal joints, moderate nonerosive plantar heel spur and a small nonerosive posterior calcaneal spur. IMPRESSION: 1. Limited assessment of the phalanges due to hammertoe deformities. No acute abnormality within study limitations. 2. Disproportionate swelling previously was noted in the third toe but not seen today. 3. Osteopenia and degenerative change, chronic hallux valgus. 4. Vascular calcifications in the distal foreleg. Electronically Signed   By: Telford Nab M.D.    On: 06/02/2021 01:27   ECHOCARDIOGRAM COMPLETE  Result Date: 06/03/2021    ECHOCARDIOGRAM REPORT   Patient Name:   KYNLEY MISHER Date of Exam: 06/03/2021 Medical Rec #:  [id number]     Height:       60.0 in Accession #:    [id number]    Weight:       105.0 lb Date of Birth:  [birth year]     BSA:          1.419 m Patient Age:    12 years      BP:           146/74 mmHg Patient Gender: F             HR:           91 bpm. Exam Location:  [facility] Procedure: 2D Echo, Color Doppler and Cardiac Doppler Indications:     I26.09 Pulmonary Embolus; Elevated troponin  History:         Patient has prior history of Echocardiogram examinations, most                  recent 12/11/2020. CHF; Risk Factors:Hypertension and  Dyslipidemia.  Sonographer:     Charmayne Sheer Referring Phys:  [id number] Ilene Qua OPYD Diagnosing Phys: Ida Rogue MD IMPRESSIONS  1. Left ventricular ejection fraction, by estimation, is 55 to 60%. The left ventricle has normal function. The left ventricle has no regional wall motion abnormalities. There is moderate left ventricular hypertrophy. Left ventricular diastolic parameters are indeterminate.  2. Right ventricular systolic function is normal. The right ventricular size is normal. There is severely elevated pulmonary artery systolic pressure. The estimated right ventricular systolic pressure is 123XX123 mmHg.  3. Left atrial size was mildly dilated.  4. The mitral valve is normal in structure. Moderate to severe mitral valve regurgitation. No evidence of mitral stenosis.  5. Tricuspid valve regurgitation is moderate to severe.  6. The aortic valve was not well visualized. Aortic valve regurgitation is not visualized. No aortic stenosis is present.  7. The inferior vena cava is normal in size with <50% respiratory variability, suggesting right atrial pressure of 8 mmHg. FINDINGS  Left Ventricle: Left ventricular ejection fraction, by estimation, is 55 to 60%. The left ventricle has normal function.  The left ventricle has no regional wall motion abnormalities. The left ventricular internal cavity size was normal in size. There is  moderate left ventricular hypertrophy. Left ventricular diastolic parameters are indeterminate. Right Ventricle: The right ventricular size is normal. No increase in right ventricular wall thickness. Right ventricular systolic function is normal. There is severely elevated pulmonary artery systolic pressure. The tricuspid regurgitant velocity is 4.48 m/s, and with an assumed right atrial pressure of 5 mmHg, the estimated right ventricular systolic pressure is 123XX123 mmHg. Left Atrium: Left atrial size was mildly dilated. Right Atrium: Right atrial size was normal in size. Pericardium: There is no evidence of pericardial effusion. Mitral Valve: The mitral valve is normal in structure. Mild mitral annular calcification. Moderate to severe mitral valve regurgitation. No evidence of mitral valve stenosis. MV peak gradient, 8.0 mmHg. The mean mitral valve gradient is 3.0 mmHg. Tricuspid Valve: The tricuspid valve is normal in structure. Tricuspid valve regurgitation is moderate to severe. No evidence of tricuspid stenosis. Aortic Valve: The aortic valve was not well visualized. Aortic valve regurgitation is not visualized. No aortic stenosis is present. Aortic valve mean gradient measures 8.0 mmHg. Aortic valve peak gradient measures 15.2 mmHg. Aortic valve area, by VTI measures 0.75 cm. Pulmonic Valve: The pulmonic valve was normal in structure. Pulmonic valve regurgitation is not visualized. No evidence of pulmonic stenosis. Aorta: The aortic root is normal in size and structure. Venous: The inferior vena cava is normal in size with less than 50% respiratory variability, suggesting right atrial pressure of 8 mmHg. IAS/Shunts: No atrial level shunt detected by color flow Doppler.  LEFT VENTRICLE PLAX 2D LVIDd:         3.77 cm   Diastology LVIDs:         3.31 cm   LV e' medial:    5.33 cm/s  LV PW:         1.57 cm   LV E/e' medial:  21.8 LV IVS:        1.16 cm   LV e' lateral:   5.22 cm/s LVOT diam:     1.50 cm   LV E/e' lateral: 22.2 LV SV:         27 LV SV Index:   19 LVOT Area:     1.77 cm  RIGHT VENTRICLE RV Basal diam:  2.74 cm RV S prime:  7.94 cm/s LEFT ATRIUM             Index        RIGHT ATRIUM           Index LA diam:        4.50 cm 3.17 cm/m   RA Area:     17.50 cm LA Vol (A2C):   55.7 ml 39.24 ml/m  RA Volume:   49.50 ml  34.87 ml/m LA Vol (A4C):   61.2 ml 43.12 ml/m LA Biplane Vol: 63.0 ml 44.38 ml/m  AORTIC VALVE                     PULMONIC VALVE AV Area (Vmax):    0.69 cm      PV Vmax:       0.68 m/s AV Area (Vmean):   0.68 cm      PV Vmean:      47.800 cm/s AV Area (VTI):     0.75 cm      PV VTI:        0.107 m AV Vmax:           195.00 cm/s   PV Peak grad:  1.9 mmHg AV Vmean:          133.000 cm/s  PV Mean grad:  1.0 mmHg AV VTI:            0.355 m AV Peak Grad:      15.2 mmHg AV Mean Grad:      8.0 mmHg LVOT Vmax:         75.70 cm/s LVOT Vmean:        51.500 cm/s LVOT VTI:          0.150 m LVOT/AV VTI ratio: 0.42  AORTA Ao Root diam: 2.70 cm MITRAL VALVE                TRICUSPID VALVE MV Area (PHT): 4.17 cm     TR Peak grad:   80.3 mmHg MV Area VTI:   1.35 cm     TR Vmax:        448.00 cm/s MV Peak grad:  8.0 mmHg MV Mean grad:  3.0 mmHg     SHUNTS MV Vmax:       1.41 m/s     Systemic VTI:  0.15 m MV Vmean:      76.5 cm/s    Systemic Diam: 1.50 cm MV Decel Time: 182 msec MV E velocity: 116.00 cm/s MV A velocity: 66.80 cm/s MV E/A ratio:  1.74 Ida Rogue MD Electronically signed by Ida Rogue MD Signature Date/Time: 06/03/2021/12:43:56 PM    Final    US THORACENTESIS ASP PLEURAL SPACE W/IMG GUIDE  Result Date: 06/02/2021 INDICATION: Patient with a history of heart failure presents today with right pleural effusion. Interventional radiology asked to perform a diagnostic and therapeutic thoracentesis. EXAM: ULTRASOUND GUIDED THORACENTESIS MEDICATIONS: 1%  lidocaine 10 mL COMPLICATIONS: None immediate. PROCEDURE: An ultrasound guided thoracentesis was thoroughly discussed with the patient and questions answered. The benefits, risks, alternatives and complications were also discussed. The patient understands and wishes to proceed with the procedure. Written consent was obtained. Ultrasound was performed to localize and mark an adequate pocket of fluid in the right chest. The area was then prepped and draped in the normal sterile fashion. 1% Lidocaine was used for local anesthesia. Under ultrasound guidance a 6 Fr Safe-T-Centesis catheter was introduced. Thoracentesis was performed. The catheter was removed and  a dressing applied. FINDINGS: A total of approximately 1.2 L of clear yellow fluid was removed. Samples were sent to the laboratory as requested by the clinical team. IMPRESSION: Successful ultrasound guided right thoracentesis yielding 1.2 of pleural fluid. Read by: Soyla Dryer, NP Electronically Signed   By: Albin Felling M.D.   On: 06/02/2021 15:52      ASSESSMENT/PLAN   Right lung consolidated pneumonia Recent COVID 19 infection 04/10/21 -this may be post viral LRTI bacterial pneuomonia vs malignancy -paitent on vancomycin and Unasyn currently  - MRSA pcr  -cytology from thoracentesis is pending -strep pneumonand legionella negative -possible aspiratoin due to mucoid debris or fluid opacifying segmental RLL    Right pleural effusion   - neutrophil predominant speciment with other labs still in process  -s/p 1.2L fluid removed.  Patient has at least 1 life-threatening severe medical condition which is being evaluated managed during this patient visit   Non-massive PE -Nonoccluding linear thrombus in segmental and subsegmental arteries in the right upper and lower lobes -on heparin currently may transition to eliquis when appropriate    Hiatal hernia with esophagitis and mucosal thickening    GI consultation  -on carafate   -reports RUQ pain , LFTs ordered   Thank you for allowing me to participate in the care of this patient.   Patient/Family are satisfied with care plan and all questions have been answered.  This document was prepared using Dragon voice recognition software and may include unintentional dictation errors.     Ottie Glazier, M.D.  Division of [place]

## 2021-06-04 NOTE — Progress Notes (Signed)
ANTICOAGULATION CONSULT NOTE ? ?Pharmacy Consult for Heparin ?Indication: pulmonary embolus ? ?Patient Measurements: ?Height: 5' (152.4 cm) ?Weight: 47.6 kg (105 lb) ?IBW/kg (Calculated) : 45.5 ?Heparin Dosing Weight: 47.6 kg ? ?Labs: ?Recent Labs  ?  06/01/21 ?2357 06/02/21 ?0110 06/02/21 ?0413 06/02/21 ?5573 06/02/21 ?1223 06/02/21 ?1637 06/03/21 ?2202 06/04/21 ?5427  ?HGB 8.1*  --   --  7.5*  --    < > 8.8* 8.7*  ?HCT 25.8*  --   --  23.7*  --   --  27.4* 27.2*  ?PLT 361  --   --  279  --   --  220 243  ?APTT  --   --  31  --   --   --   --   --   ?LABPROT  --   --  15.0  --   --   --   --   --   ?INR  --   --  1.2  --   --   --   --   --   ?HEPARINUNFRC  --   --   --   --  0.11*  --   --   --   ?CREATININE 0.88  --   --  0.87  --   --  0.81 0.80  ?TROPONINIHS 108* 94*  --   --   --   --   --   --   ? < > = values in this interval not displayed.  ? ? ? ?Estimated Creatinine Clearance: 38.3 mL/min (by C-G formula based on SCr of 0.8 mg/dL). ? ? ?Assessment: ?86 year old female admitted with CP found to be a result of segmental/subsegmental PEs in upper and lower lobes. Initial heparin gtt subtherapeutic, gtt running without complications. Hgb low in setting of Anemia of CKD. No active bleeding noted.  ? ?Podiatry following for right third toe osteomyelitis and PAD. Recommending vascular surgery consult. Anticipating toe amputation. ? ?Goal of Therapy:  ?Heparin level 0.3-0.7 units/ml ?Monitor platelets by anticoagulation protocol: Yes ?  ?Plan:  ?--Heparin 3000 unit IV bolus followed by continuous infusion at 1000 units/hr based on previous dosing this admission ?--Heparin level 8 hours after initiation of infusion ?--Daily CBC per protocol while on IV heparin; heparin previously discontinued for worsening anemia ? ?Tressie Ellis ?06/04/2021,8:03 AM ? ? ?

## 2021-06-04 NOTE — Consult Note (Signed)
Vascular and Vein Specialist of   Patient name: Jenna Dudley MRN: [id number] DOB: 11-Jul-1935 Sex: female   REQUESTING PROVIDER:    Dr. Chipper Herb   REASON FOR CONSULT:    PAD withulcer  HISTORY OF PRESENT ILLNESS:   Jenna Dudley is a 86 y.o. female, who was admitted on 06/02/2021 with chest pain.  She was afebrile and tachycardic.  CT shows segmental PE with no heart strain.  She was diagnosed with sepsis secondary to pneumonia.  She was admitted and started on IV antibiotics.  She was also started on IV heparin.  She also has a right third toe wound  SHe has a history of chronic diastolic CHF, CVA, and bipolar disorder.  Today, she is sitting in a chair complaining of chest pain.  She did not describe any foot pain today  PAST MEDICAL HISTORY    Past Medical History:  Diagnosis Date   Bilateral carpal tunnel syndrome 07/27/2014   Bipolar disorder (HCC)    geropsych admission to [facility] in Dec 2018   Chronic diastolic CHF (congestive heart failure) (HCC)    a. 03/2017 Echo: EF 55-60%, no rwma, Gr1 DD, mild MR; b. 06/2019 Echo: EF 55-60%, no rwma, mod LVH, GrII DD. Nl RV size/fxn. RVSP 54.60mmHg. Mildly dil LA.   Chronic low back pain 07/29/2014   Degenerative arthritis of lumbar spine 07/27/2014   Dementia (HCC)    Depression    Esophageal spasm 07/29/2014   Gastric reflux 09/17/2014   GERD (gastroesophageal reflux disease)    History of stress test    a. 04/2017 MV: low risk stress test.  EF 55-65%. Probable apical ant/apical, basal, and mid inflat attenuation artifact vs ischemia/scar.   Hyperlipemia 07/29/2014   Hypertension    Pneumonia    Spinal stenosis 07/29/2014     FAMILY HISTORY   Family History  Problem Relation Age of Onset   Hypertension Mother    Stroke Mother     SOCIAL HISTORY:   Social History   Socioeconomic History   Marital status: Married    Spouse name: Not on file   Number of children: Not on  file   Years of education: Not on file   Highest education level: Not on file  Occupational History    Comment: retired  Tobacco Use   Smoking status: Former    Types: Cigarettes    Quit date: 07/29/1975    Years since quitting: 45.8   Smokeless tobacco: Never  Vaping Use   Vaping Use: Never used  Substance and Sexual Activity   Alcohol use: No    Alcohol/week: 0.0 standard drinks   Drug use: No   Sexual activity: Never    Comment: postmenopause  Other Topics Concern   Not on file  Social History Narrative   Not on file   Social Determinants of Health   Financial Resource Strain: Not on file  Food Insecurity: Not on file  Transportation Needs: Not on file  Physical Activity: Not on file  Stress: Not on file  Social Connections: Not on file  Intimate Partner Violence: Not on file    ALLERGIES:    Allergies  Allergen Reactions   Aspirin Other (See Comments)    Unknown reaction   Sulfa Antibiotics Other (See Comments)    Unknown reaction    CURRENT MEDICATIONS:    Current Facility-Administered Medications  Medication Dose Route Frequency Provider Last Rate Last Admin   0.9 %  sodium chloride infusion  Intravenous PRN Marrion CoyZhang, Dekui, MD   Paused at 06/04/21 1324   acetaminophen (TYLENOL) tablet 650 mg  650 mg Oral Q6H PRN Opyd, Lavone Neriimothy S, MD   650 mg at 06/04/21 1647   Or   acetaminophen (TYLENOL) suppository 650 mg  650 mg Rectal Q6H PRN Opyd, Lavone Neriimothy S, MD       albuterol (PROVENTIL) (2.5 MG/3ML) 0.083% nebulizer solution 2.5 mg  2.5 mg Nebulization Q6H PRN Marrion CoyZhang, Dekui, MD       Ampicillin-Sulbactam (UNASYN) 3 g in sodium chloride 0.9 % 100 mL IVPB  3 g Intravenous Q8H Belue, Lendon Collarathan S, RPH 200 mL/hr at 06/04/21 1653 3 g at 06/04/21 1653   cyanocobalamin ((VITAMIN B-12)) injection 1,000 mcg  1,000 mcg Intramuscular Daily Marrion CoyZhang, Dekui, MD   1,000 mcg at 06/04/21 0950   feeding supplement (BOOST / RESOURCE BREEZE) liquid 1 Container  1 Container Oral TID BM Marrion CoyZhang,  Dekui, MD   1 Container at 06/04/21 0950   heparin ADULT infusion 100 units/mL (25000 units/27350mL)  1,150 Units/hr Intravenous Continuous Rauer, Robyne PeersSamantha O, RPH 11.5 mL/hr at 06/04/21 2206 1,150 Units/hr at 06/04/21 2206   lactated ringers 1,000 mL with potassium chloride 20 mEq infusion   Intravenous Continuous Tressie EllisChappell, Alex B, RPH 50 mL/hr at 06/04/21 2156 Restarted at 06/04/21 2156   MEDLINE mouth rinse  15 mL Mouth Rinse BID Marrion CoyZhang, Dekui, MD   15 mL at 06/04/21 0830   megestrol (MEGACE) 400 MG/10ML suspension 200 mg  200 mg Oral Daily Marrion CoyZhang, Dekui, MD   200 mg at 06/04/21 1323   metoprolol tartrate (LOPRESSOR) tablet 25 mg  25 mg Oral BID Marrion CoyZhang, Dekui, MD   25 mg at 06/04/21 2017   mirtazapine (REMERON) tablet 45 mg  45 mg Oral QHS Marrion CoyZhang, Dekui, MD   45 mg at 06/04/21 2017   multivitamin with minerals tablet 1 tablet  1 tablet Oral Daily Marrion CoyZhang, Dekui, MD   1 tablet at 06/04/21 0950   ondansetron (ZOFRAN) tablet 4 mg  4 mg Oral Q6H PRN Opyd, Lavone Neriimothy S, MD       Or   ondansetron (ZOFRAN) injection 4 mg  4 mg Intravenous Q6H PRN Opyd, Lavone Neriimothy S, MD   4 mg at 06/04/21 2148   pantoprazole (PROTONIX) EC tablet 40 mg  40 mg Oral Daily Marrion CoyZhang, Dekui, MD   40 mg at 06/04/21 0950   polyethylene glycol (MIRALAX / GLYCOLAX) packet 17 g  17 g Oral Daily PRN Marrion CoyZhang, Dekui, MD       QUEtiapine (SEROQUEL) tablet 25 mg  25 mg Oral QHS Marrion CoyZhang, Dekui, MD   25 mg at 06/04/21 2018   simvastatin (ZOCOR) tablet 40 mg  40 mg Oral QHS Marrion CoyZhang, Dekui, MD   40 mg at 06/04/21 2017   sodium chloride flush (NS) 0.9 % injection 3 mL  3 mL Intravenous Q12H Opyd, Lavone Neriimothy S, MD   3 mL at 06/04/21 0831   sucralfate (CARAFATE) tablet 1 g  1 g Oral TID WC & HS Marrion CoyZhang, Dekui, MD   1 g at 06/04/21 2018   vancomycin (VANCOCIN) IVPB 1000 mg/200 mL premix  1,000 mg Intravenous Q36H Ronnald Rampatel, Kishan S, RPH 200 mL/hr at 06/04/21 2046 1,000 mg at 06/04/21 2046   venlafaxine XR (EFFEXOR-XR) 24 hr capsule 75 mg  75 mg Oral Q breakfast Marrion CoyZhang, Dekui,  MD   75 mg at 06/04/21 [id number]   Facility-Administered Medications Ordered in Other Encounters  Medication Dose Route Frequency Provider Last Rate Last Admin  0.9 %  sodium chloride infusion    Continuous PRN Lily Kocher, CRNA   Continued from Pre-op at 08/07/18 1110   lactated ringers infusion   Intravenous Continuous Clapacs, Jackquline Denmark, MD   New Bag at 07/09/19 1014    REVIEW OF SYSTEMS:   [X]  denotes positive finding, [ ]  denotes negative finding Cardiac  Comments:  Chest pain or chest pressure: x   Shortness of breath upon exertion:    Short of breath when lying flat:    Irregular heart rhythm:        Vascular    Pain in calf, thigh, or hip brought on by ambulation:    Pain in feet at night that wakes you up from your sleep:     Blood clot in your veins:    Leg swelling:         Pulmonary    Oxygen at home:    Productive cough:     Wheezing:         Neurologic    Sudden weakness in arms or legs:     Sudden numbness in arms or legs:     Sudden onset of difficulty speaking or slurred speech:    Temporary loss of vision in one eye:     Problems with dizziness:         Gastrointestinal    Blood in stool:      Vomited blood:         Genitourinary    Burning when urinating:     Blood in urine:        Psychiatric    Major depression:         Hematologic    Bleeding problems:    Problems with blood clotting too easily:        Skin    Rashes or ulcers: x       Constitutional    Fever or chills:     PHYSICAL EXAM:   Vitals:   06/04/21 0731 06/04/21 1134 06/04/21 1544 06/04/21 2008  BP: 115/70 133/60 (!) 141/70 133/65  Pulse: 76 71 70 86  Resp: 16 18 18 20   Temp: 98 F (36.7 C) 98.2 F (36.8 C) 98 F (36.7 C) 97.7 F (36.5 C)  TempSrc:      SpO2: 95% 97% 95% 100%  Weight:      Height:        GENERAL: The patient is a well-nourished female, in no acute distress. The vital signs are documented above. CARDIAC: There is a regular rate and rhythm.   VASCULAR: Palpable femoral pulses PULMONARY: Nonlabored respirations ABDOMEN: Soft and non-tender with normal pitched bowel sounds.  MUSCULOSKELETAL: There are no major deformities or cyanosis. NEUROLOGIC: No focal weakness or paresthesias are detected. SKIN: See photo below PSYCHIATRIC: The patient has a normal affect.   STUDIES:   I have reviewed the following duplex: 1. Normal right common femoral artery. 2. The superficial femoral and popliteal arteries demonstrate dampened and monophasic waveforms, suggestive of proximal disease although a culprit lesion is not definitively identified on this exam. 3. The anterior tibial artery appears occluded. Dampened waveforms of the posterior tibial artery.  ASSESSMENT and PLAN   Right foot ulcer: Patient is going to need further evaluation of her blood flow with angiography.  She has palpable femoral pulses and so I expect this could be a outflow and runoff problem.  I will make her n.p.o. after midnight for possible procedure tomorrow, however based on her appearance  today, I am not sure she will be medically ready for angiography.  She is complaining of constant chest pain.  She has a known pleural effusion and recent PE.  We will reevaluate her in the morning to see if she could proceed with angiography.   Charlena Cross, MD, FACS Vascular and Vein Specialists of [facility] [phone] Pager [phone]

## 2021-06-04 NOTE — Progress Notes (Addendum)
PT Cancellation Note ? ?Patient Details ?Name: Jenna Dudley ?MRN: [id number] ?DOB: Jan 25, 1936 ? ? ?Cancelled Treatment:    Reason Eval/Treat Not Completed: Patient not medically ready;Medical issues which prohibited therapy Due to patient recently placed on heparin drip secondary to acute PE, will hold on PT at this time, and will re-attempt at a later time/date as available and patient medically appropriate for PT. Thank you! ? ? ?Angelica Ran, PT  ?06/04/21. 1:45 PM ? ?

## 2021-06-04 NOTE — Progress Notes (Signed)
OT Cancellation Note ? ?Patient Details ?Name: Jenna Dudley ?MRN: [id number] ?DOB: [birth year] ? ? ?Cancelled Treatment:    Reason Eval/Treat Not Completed: Medical issues which prohibited therapy.  OT order received, chart reviewed. Patient recently placed on heparin drip secondary to acute PE, will hold OT evaluation at this time, and re-attempt after 24 hours or infusion DC'd per facility protocols. OT will continue to monitor remotely and initiate services as pt medically appropriate.  Thank you.  ? ?Rockney Ghee, M.S., OTR/L ?Feeding Team - [facility] ?Ascom: [phone] ?06/04/21, 1:49 PM ? ? ?

## 2021-06-04 NOTE — Progress Notes (Signed)
Subjective: ?86 year old female with past medical history significant for CHF, bipolar, history of CVA presented with concerns of chest pain.  Podiatrist consulted for right foot ulcer, infection.  Osteomyelitis of the third toe.  Ultrasound has been completed.  Patient's daughters are at bedside today. ? ?Objective: ?NAD- laying in bed ?Unable to palpate pulses ?Wound, gangrenous changes present distal aspect of right third toe.  There is also superficial breakdown of the right fourth toe lateral aspect and likely irritation from the third toe swelling.  There is purulence coming from the lateral aspect of the third digit.  No significant worsening cellulitis.  No fluctuation or crepitation. ? ?Assessment: ?Right third toe osteomyelitis, PAD ? ?Plan: ?Duplex reviewed which revealed superficial femoral-popliteal arteries with monophasic waveforms with proximal disease as well as anterior tibial artery occluded and decreased waveforms in the posterior tibial artery.  Recommend vascular surgery consult.  Likely to have amputation of the toe if medically stable but will await vascular surgery evaluation.  I discussed this with the patient's daughters today who are at bedside.  Podiatry will continue to follow. ? ?Ovid Curd, DPM ? ?

## 2021-06-04 NOTE — Assessment & Plan Note (Addendum)
Patient has very poor appetite, has not been eating for the last 4 days since admission.  Discussed with patient husband, patient had periods of not eating, then bounced back eat a lot.  I will increase the dose of megestrol to 400 mg daily.  We will consider tube feeding if condition does not improve.

## 2021-06-04 NOTE — Progress Notes (Signed)
ANTICOAGULATION CONSULT NOTE ? ?Pharmacy Consult for Heparin ?Indication: pulmonary embolus ? ?Patient Measurements: ?Height: 5' (152.4 cm) ?Weight: 47.6 kg (105 lb) ?IBW/kg (Calculated) : 45.5 ?Heparin Dosing Weight: 47.6 kg ? ?Labs: ?Recent Labs  ?  06/01/21 ?2357 06/02/21 ?0110 06/02/21 ?0413 06/02/21 ?1751 06/02/21 ?1223 06/02/21 ?1637 06/03/21 ?0258 06/04/21 ?5277 06/04/21 ?1855  ?HGB 8.1*  --   --  7.5*  --    < > 8.8* 8.7*  --   ?HCT 25.8*  --   --  23.7*  --   --  27.4* 27.2*  --   ?PLT 361  --   --  279  --   --  220 243  --   ?APTT  --   --  31  --   --   --   --   --   --   ?LABPROT  --   --  15.0  --   --   --   --   --   --   ?INR  --   --  1.2  --   --   --   --   --   --   ?HEPARINUNFRC  --   --   --   --  0.11*  --   --   --  <0.10*  ?CREATININE 0.88  --   --  0.87  --   --  0.81 0.80  --   ?TROPONINIHS 108* 94*  --   --   --   --   --   --   --   ? < > = values in this interval not displayed.  ? ? ? ?Estimated Creatinine Clearance: 38.3 mL/min (by C-G formula based on SCr of 0.8 mg/dL). ? ? ?Assessment: ?86 year old female admitted with CP found to be a result of segmental/subsegmental PEs in upper and lower lobes. Initial heparin gtt subtherapeutic, gtt running without complications. Hgb low in setting of Anemia of CKD. No active bleeding noted.  ? ?Podiatry following for right third toe osteomyelitis and PAD. Recommending vascular surgery consult. Anticipating toe amputation. ? ?3/11 1855 HL <0.10, subtherapeutic ? ?Goal of Therapy:  ?Heparin level 0.3-0.7 units/ml ?Monitor platelets by anticoagulation protocol: Yes ?  ?Plan:  ?Heparin level is subtherapeutic. Will give 1400 unit heparin bolus and increase infusion rate to 1150 units/hr. Re-check heparin level 8 hours after rate change. Daily CBC per protocol while on IV heparin; heparin previously discontinued for worsening anemia. ? ?Jenna Dudley Jenna Dudley ?06/04/2021,9:00 PM ? ? ?

## 2021-06-05 DIAGNOSIS — R630 Anorexia: Secondary | ICD-10-CM | POA: Diagnosis not present

## 2021-06-05 DIAGNOSIS — J690 Pneumonitis due to inhalation of food and vomit: Secondary | ICD-10-CM | POA: Diagnosis not present

## 2021-06-05 DIAGNOSIS — J90 Pleural effusion, not elsewhere classified: Secondary | ICD-10-CM | POA: Diagnosis not present

## 2021-06-05 DIAGNOSIS — M86672 Other chronic osteomyelitis, left ankle and foot: Secondary | ICD-10-CM | POA: Diagnosis not present

## 2021-06-05 DIAGNOSIS — J850 Gangrene and necrosis of lung: Secondary | ICD-10-CM | POA: Diagnosis not present

## 2021-06-05 LAB — CBC
HCT: 26.7 % — ABNORMAL LOW (ref 36.0–46.0)
Hemoglobin: 8.4 g/dL — ABNORMAL LOW (ref 12.0–15.0)
MCH: 34.4 pg — ABNORMAL HIGH (ref 26.0–34.0)
MCHC: 31.5 g/dL (ref 30.0–36.0)
MCV: 109.4 fL — ABNORMAL HIGH (ref 80.0–100.0)
Platelets: 199 10*3/uL (ref 150–400)
RBC: 2.44 MIL/uL — ABNORMAL LOW (ref 3.87–5.11)
RDW: 20.5 % — ABNORMAL HIGH (ref 11.5–15.5)
WBC: 4 10*3/uL (ref 4.0–10.5)
nRBC: 0 % (ref 0.0–0.2)

## 2021-06-05 LAB — CULTURE, BLOOD (ROUTINE X 2): Special Requests: ADEQUATE

## 2021-06-05 LAB — HEPARIN LEVEL (UNFRACTIONATED)
Heparin Unfractionated: 0.23 IU/mL — ABNORMAL LOW (ref 0.30–0.70)
Heparin Unfractionated: 0.33 IU/mL (ref 0.30–0.70)
Heparin Unfractionated: 0.33 IU/mL (ref 0.30–0.70)

## 2021-06-05 LAB — MAGNESIUM: Magnesium: 1.9 mg/dL (ref 1.7–2.4)

## 2021-06-05 LAB — BASIC METABOLIC PANEL
Anion gap: 5 (ref 5–15)
BUN: 15 mg/dL (ref 8–23)
CO2: 28 mmol/L (ref 22–32)
Calcium: 8.3 mg/dL — ABNORMAL LOW (ref 8.9–10.3)
Chloride: 107 mmol/L (ref 98–111)
Creatinine, Ser: 0.68 mg/dL (ref 0.44–1.00)
GFR, Estimated: >60 mL/min (ref >60)
Glucose, Bld: 113 mg/dL — ABNORMAL HIGH (ref 70–99)
Potassium: 3.5 mmol/L (ref 3.5–5.1)
Sodium: 140 mmol/L (ref 135–145)

## 2021-06-05 MED ORDER — HEPARIN BOLUS VIA INFUSION
700.0000 [IU] | Freq: Once | INTRAVENOUS | Status: AC
  Administered 2021-06-05: 700 [IU] via INTRAVENOUS
  Filled 2021-06-05: qty 700

## 2021-06-05 NOTE — TOC Progression Note (Addendum)
Transition of Care (TOC) - Progression Note  ? ? ?Patient Details  ?Name: Jenna Dudley ?MRN: [id number] ?Date of Birth: 27-Jul-1935 ? ?Transition of Care (TOC) CM/SW Contact  ?Kamill Fulbright, LCSW ?Phone Number: [phone] ?06/05/2021, 10:33 AM ? ?Clinical Narrative:    ? ?Patient lives at home with her spouse Deshanda Molitor [phone], has a PCP and cardiologist, receives medication from [facility],  active with [facility]. Patient currently on heparin drip. PT recommendation for home health PT.  Attending confirmed he will order PT home health closer to d/c, estimated d/c date is 3/16 or 3/17. ?  ?  ? ?Expected Discharge Plan and Services ?  ?  ?  ?  ?  ?                ?  ?  ?  ?  ?  ?  ?  ?  ?  ?  ? ? ?Social Determinants of Health (SDOH) Interventions ?  ? ?Readmission Risk Interventions ?No flowsheet data found. ? ?

## 2021-06-05 NOTE — Progress Notes (Signed)
?Progress Note ? ? ?Patient: Jenna Dudley PYP:[id number] DOB: Aug 02, 1935 DOA: 06/01/2021     3 ?DOS: the patient was seen and examined on 06/05/2021 ?  ?Brief hospital course: ?BRENDALIZ KUK is a pleasant 86 y.o. female with medical history significant for chronic diastolic CHF, GERD, bipolar disorder, history of CVA, and right third toe wound, now presenting to the emergency department for evaluation of chest pain. ?In the hospital, she had heart rate of 114, resp irate 28, lactic acid 4.0.  CTA chest showed segmental PEs, right side opacity with a fluid level.  Also showed additional groundglass opacities, thickened esophagus wall.  And moderate to large right-sided pleural effusion.  Patient was given IV fluids, lactic acid level has dropped down to normal.  She is also started on antibiotics with Unasyn and vancomycin. ? ?Assessment and Plan: ?* Aspiration pneumonia ([place]) ?Patient is evaluated by speech therapy, currently on dysphagia 1 diet.  Continue Unasyn. ? ?Anorexia ?Spoke with RN, need a better documentation of I&O's.  I will continue megestrol. ? 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 ?Continue oral iron. ? ?Pleural effusion on right ?Status post thoracentesis with removing 1.2 L.  Lab study showed 79% neutrophils, Gram stain and cultures pending.  Glucose high, protein low.  LDH 61.  Appears to be transudate. ?Culture negative in 3 days. ? ?Hypomagnesemia ?Improved. ? ?Necrotizing pneumonia ([place]) ?Appreciate consult from pulmonology.  Continue antibiotics. ?Blood culture ?1/4 bottle blood culture positive for Corynebacterium.  This is a contaminant. ? ?Chest pain ?Secondary to PE. ? ?Elevated troponin ?Peak troponin was 108, dropped down to 94.  This is secondary to sepsis. ? ?Pulmonary emboli ([place]) ?Continue heparin drip. ? ?Chronic diastolic CHF (congestive heart failure) ([place]) ?Acute congestive heart  failure ruled out. ?Patient does not seem to have any volume overload.  Continue to follow. ? ?Hypokalemia ?Improved. ? ?Toe osteomyelitis, left ([place]) ?Patient has been evaluated by podiatry,  duplex ultrasound showed peripheral arterial disease, MRI showed third toe osteomyelitis.  Discussed with podiatry yesterday, obtain vascular surgery consult, patient may need amputation on Monday. ?Continue Unasyn and vancomycin for now. ? ?Delirium with dementia ?Condition has improved. ? ?Bipolar 1 disorder ([place]) ?Resume home medicines. ? ?Severe sepsis ([place]) ?Patient met severe sepsis criteria with tachycardia and tachypnea.  Lactic acid level of 4.0.  This is secondary to pneumonia. ?Patient is clinically stable. ? ?History of stroke ?Continue to follow. ? ? ? ? ?  ? ?Subjective:  ?Patient has some confusion, but is sleeping better at night. ?She appears to still has poor appetite, I spoke with nurse, there is no documented I&O's.  Will need better documentation of input. ?No short of breath or cough. ? ?Physical Exam: ?Vitals:  ? 06/05/21 0012 06/05/21 0500 06/05/21 2458 06/05/21 0749  ?BP: 113/71  (!) 120/45 (!) 150/60  ?Pulse: 78  81 79  ?Resp: _0 ?Temp: 97.8 ?F (36.6 ?C)  98.4 ?F (36.9 ?C) 98.2 ?F (36.8 ?C)  ?TempSrc:      ?SpO2: (!) 89%  91% 90%  ?Weight:  49.9 kg    ?Height:      ? ?General exam: Appears calm and comfortable  ?Respiratory system: Clear to auscultation. Respiratory effort normal. ?Cardiovascular system: S1 & S2 heard, RRR. No JVD, murmurs, rubs, gallops or clicks. No pedal edema. ?Gastrointestinal system: Abdomen is nondistended, soft and nontender. No organomegaly or masses felt.  Normal bowel sounds heard. ?Central nervous system: Drowsy and oriented x2. No focal neurological deficits. ?Extremities: Symmetric 5 x 5 power. ?Skin: No rashes, lesions or ulcers ? ?Data Reviewed: ?Reviewed all lab results. ? ?Family Communication:  ? ?Disposition: ?Status is: Inpatient ?Remains inpatient  appropriate because: Verity of disease, IV treatments. ? Planned Discharge Destination: Home with Home Health ? ? ? ?Time spent: 35 minutes ? ?Author: ?Sharen Hones, MD ?06/05/2021 12:25 PM ? ?For on call review [email or web address].  ?

## 2021-06-05 NOTE — Plan of Care (Signed)
?  Problem: Activity: ?Goal: Ability to tolerate increased activity will improve ?Outcome: Progressing ?  ?Problem: Clinical Measurements: ?Goal: Ability to maintain a body temperature in the normal range will improve ?Outcome: Progressing ?  ?Problem: Respiratory: ?Goal: Ability to maintain adequate ventilation will improve ?Outcome: Progressing ?Goal: Ability to maintain a clear airway will improve ?Outcome: Progressing ?  ?Problem: Education: ?Goal: Knowledge of General Education information will improve ?Description: Including pain rating scale, medication(s)/side effects and non-pharmacologic comfort measures ?Outcome: Not Met (add Reason) ?  ?Problem: Health Behavior/Discharge Planning: ?Goal: Ability to manage health-related needs will improve ?Outcome: Not Met (add Reason) ?  ?Problem: Clinical Measurements: ?Goal: Ability to maintain clinical measurements within normal limits will improve ?Outcome: Progressing ?Goal: Will remain free from infection ?Outcome: Progressing ?Goal: Diagnostic test results will improve ?Outcome: Progressing ?Goal: Respiratory complications will improve ?Outcome: Progressing ?Goal: Cardiovascular complication will be avoided ?Outcome: Progressing ?  ?Problem: Activity: ?Goal: Risk for activity intolerance will decrease ?Outcome: Progressing ?  ?Problem: Nutrition: ?Goal: Adequate nutrition will be maintained ?Outcome: Progressing ?  ?Problem: Clinical Measurements: ?Goal: Will remain free from infection ?Outcome: Progressing ?  ?Problem: Clinical Measurements: ?Goal: Diagnostic test results will improve ?Outcome: Progressing ?  ?Problem: Clinical Measurements: ?Goal: Respiratory complications will improve ?Outcome: Progressing ?  ?Problem: Clinical Measurements: ?Goal: Cardiovascular complication will be avoided ?Outcome: Progressing ?  ?Problem: Pain Managment: ?Goal: General experience of comfort will improve ?Outcome: Progressing ?  ?Problem: Safety: ?Goal: Ability to remain  free from injury will improve ?Outcome: Progressing ?  ?Problem: Skin Integrity: ?Goal: Risk for impaired skin integrity will decrease ?Outcome: Progressing ?  ?

## 2021-06-05 NOTE — Progress Notes (Signed)
Subjective: ?86 year old female with past medical history significant for CHF, bipolar, history of CVA presented with concerns of chest pain.  Podiatry consulted for right foot ulcer, infection.  Osteomyelitis  noted of the third toe.  Ultrasound has been completed and awaiting vascular surgery.  ? ?Objective: ?NAD- laying in bed ?Unable to palpate pulses ?Exam agrees but unchanged.  Necrotic changes present distal aspect the right third toe appears normal purulence noted along the interspace with wound, skin breakdown left fourth digit as well.  Mild erythema.  No fluctuance or crepitation.  There is tenderness palpation to the digit. ? ?Assessment: ?Right third toe osteomyelitis, PAD ? ?Plan: ?Duplex reviewed which revealed superficial femoral-popliteal arteries with monophasic waveforms with proximal disease as well as anterior tibial artery occluded and decreased waveforms in the posterior tibial artery.  Vascular surgery consult.  Plan likely for angio tomorrow.  Patient likely need amputation however we will await vascular surgery evaluation prior to any surgical invention.  Patient's daughters were at bedside yesterday I discussed this with them as well.   ? ?Ovid Curd, DPM ? ?

## 2021-06-05 NOTE — Progress Notes (Signed)
ANTICOAGULATION CONSULT NOTE ? ?Pharmacy Consult for Heparin ?Indication: pulmonary embolus ? ?Patient Measurements: ?Height: 5' (152.4 cm) ?Weight: 49.9 kg (110 lb 0.2 oz) ?IBW/kg (Calculated) : 45.5 ?Heparin Dosing Weight: 47.6 kg ? ?Labs: ?Recent Labs  ?  06/03/21 ?0418 06/04/21 ?1884 06/04/21 ?1855 06/05/21 ?0436 06/05/21 ?1352  ?HGB 8.8* 8.7*  --  8.4*  --   ?HCT 27.4* 27.2*  --  26.7*  --   ?PLT 220 243  --  199  --   ?HEPARINUNFRC  --   --  <0.10* 0.23* 0.33  ?CREATININE 0.81 0.80  --  0.68  --   ? ? ? ?Estimated Creatinine Clearance: 38.3 mL/min (by C-G formula based on SCr of 0.68 mg/dL). ? ? ?Assessment: ?86 year old female admitted with CP found to be a result of segmental/subsegmental PEs in upper and lower lobes. Initial heparin gtt subtherapeutic, gtt running without complications. Hgb low in setting of Anemia of CKD. No active bleeding noted.  ? ?Podiatry following for right third toe osteomyelitis and PAD. Recommending vascular surgery consult. Anticipating toe amputation. ? ?Goal of Therapy:  ?Heparin level 0.3-0.7 units/ml ?Monitor platelets by anticoagulation protocol: Yes ?  ?Plan:  ?--Heparin level is therapeutic x 1 ?--Continue heparin infusion at 1250 units/hr ?--Re-check confirmatory HL in 8 hours ?--Daily CBC per protocol while on IV heparin; heparin previously discontinued for worsening anemia ? ?Tressie Ellis  ?06/05/2021 ?2:44 PM ? ? ? ?

## 2021-06-05 NOTE — Evaluation (Signed)
Occupational Therapy Evaluation ?Patient Details ?Name: Jenna Dudley ?MRN: [id number] ?DOB: 1935-10-19 ?Today's Date: 06/05/2021 ? ? ?History of Present Illness Patient is an 86 year old female with a PMH (+) for chronic diastolic CHF, GERD, bipolar disorder, history of CVA, and right third toe wound. Patient presented to [facility] ED on 06/01/21 for chest pain, and was admitted for Aspiration pneumonia.  ? ?Clinical Impression ?  ?Chart reviewed, RN present during evaluation attempt. Husband present at start of evaluation to provide some biographical information. Pt is oriented to self only at this time. Pt refuses much of attempted mobility/ADL task completion on this date. She requires MAX A for ADL task completion, MAX A for bed mobility, sits on edge of bed for no more than 10 seconds and returns to bed stating "I just want to sleep". Per RN, husband and chart review pt is able to perform bed mobility and transfer to chair with CGA. Husband reports he will assist pt at home following discharge. At this time recommend [place]. OT will continue to follow acutely.  ?   ? ?Recommendations for follow up therapy are one component of a multi-disciplinary discharge planning process, led by the attending physician.  Recommendations may be updated based on patient status, additional functional criteria and insurance authorization.  ? ?Follow Up Recommendations ? Home health OT  ?  ?Assistance Recommended at Discharge Frequent or constant Supervision/Assistance  ?Patient can return home with the following A little help with walking and/or transfers;A little help with bathing/dressing/bathroom;Assistance with cooking/housework;Direct supervision/assist for financial management;Assist for transportation;Direct supervision/assist for medications management ? ?  ?Functional Status Assessment ? Patient has had a recent decline in their functional status and demonstrates the ability to make significant improvements in function in a  reasonable and predictable amount of time.  ?Equipment Recommendations ? None recommended by OT  ?  ?Recommendations for Other Services   ? ? ?  ?Precautions / Restrictions Precautions ?Precautions: Fall ?Restrictions ?Weight Bearing Restrictions: No  ? ?  ? ?Mobility Bed Mobility ?Overal bed mobility: Needs Assistance ?Bed Mobility: Supine to Sit, Rolling ?Rolling: Max assist ?  ?Supine to sit: Max assist ?  ?  ?  ?  ? ?Transfers ?  ?  ?  ?  ?  ?  ?  ?  ?  ?General transfer comment: pt declined ?  ? ?  ?Balance   ?  ?  ?  ?  ?  ?  ?  ?  ?  ?  ?  ?  ?  ?  ?  ?  ?  ?  ?   ? ?ADL either performed or assessed with clinical judgement  ? ?ADL Overall ADL's : Needs assistance/impaired ?  ?  ?  ?  ?  ?  ?  ?  ?  ?  ?  ?  ?  ?  ?  ?  ?  ?  ?  ?General ADL Comments: MAX A for LB dressing, pt is resistive to all other ADL task completion; MAX A for boosting up the bed; per RN report and discussion with PT pt amb with RW with CGA to bedside chair  ? ? ? ?Vision Patient Visual Report: No change from baseline ?   ?   ?Perception   ?  ?Praxis   ?  ? ?Pertinent Vitals/Pain Pain Assessment ?Pain Assessment: Faces ?Faces Pain Scale: Hurts a little bit ?Pain Descriptors / Indicators: Guarding ?Pain Intervention(s): Monitored during session  ? ? ? ?  Hand Dominance   ?  ?Extremity/Trunk Assessment Upper Extremity Assessment ?Upper Extremity Assessment: Overall WFL for tasks assessed ?  ?Lower Extremity Assessment ?Lower Extremity Assessment: Generalized weakness ?  ?  ?  ?Communication   ?  ?Cognition Arousal/Alertness: Lethargic ?Behavior During Therapy: Agitated, Impulsive ?Overall Cognitive Status: Impaired/Different from baseline ?Area of Impairment: Orientation, Attention, Following commands, Safety/judgement, Awareness, Problem solving ?  ?  ?  ?  ?  ?  ?  ?  ?Orientation Level: Disoriented to, Time, Situation ?Current Attention Level: Focused ?  ?Following Commands: Follows one step commands inconsistently ?Safety/Judgement:  Decreased awareness of safety, Decreased awareness of deficits ?Awareness: Intellectual ?Problem Solving: Decreased initiation, Requires verbal cues, Requires tactile cues ?General Comments: Pt stating "I just want to lay here" "you dont want to help me"; husband reports behavior similar at times at home due to psych history ?  ?  ?General Comments  vss throughout; pt resistive to all intervention ? ?  ?Exercises Other Exercises ?Other Exercises: educated re: role of OT, role of rehab, importance of oob mobility, importance of participation in therapy ?  ?Shoulder Instructions    ? ? ?Home Living Family/patient expects to be discharged to:: Private residence ?Living Arrangements: Alone ?Available Help at Discharge: Family ?Type of Home: House ?  ?  ?  ?Home Layout: One level ?  ?  ?Bathroom Shower/Tub: Tub/shower unit ?  ?  ?  ?  ?  ?  ?Additional Comments: Pt unable to answer home set up, home set up from  husband and chart review ?  ? ?  ?Prior Functioning/Environment   ?  ?  ?  ?  ?  ?  ?Mobility Comments: Pt husband reports amb with no AD ?ADLs Comments: MOD I in ADL per husband, assist when needed due to psych status ?  ? ?  ?  ?OT Problem List: Decreased strength;Decreased activity tolerance ?  ?   ?OT Treatment/Interventions: Self-care/ADL training;Manual therapy;Therapeutic exercise;Neuromuscular education;Energy conservation;Therapeutic activities;DME and/or AE instruction;Patient/family education  ?  ?OT Goals(Current goals can be found in the care plan section) Acute Rehab OT Goals ?Patient Stated Goal: sleep ?OT Goal Formulation: With patient ?Time For Goal Achievement: 06/19/21 ?Potential to Achieve Goals: Good ?ADL Goals ?Pt Will Perform Grooming: with supervision ?Pt Will Perform Upper Body Dressing: with supervision ?Pt Will Perform Lower Body Dressing: with supervision ?Pt Will Transfer to Toilet: with supervision ?Pt Will Perform Toileting - Clothing Manipulation and hygiene: with supervision  ?OT  Frequency: Min 2X/week ?  ? ?Co-evaluation   ?  ?  ?  ?  ? ?  ?AM-PAC OT "6 Clicks" Daily Activity     ?Outcome Measure Help from another person eating meals?: A Lot ?Help from another person taking care of personal grooming?: A Lot ?Help from another person toileting, which includes using toliet, bedpan, or urinal?: A Lot ?Help from another person bathing (including washing, rinsing, drying)?: A Lot ?Help from another person to put on and taking off regular upper body clothing?: A Little ?Help from another person to put on and taking off regular lower body clothing?: A Lot ?6 Click Score: 13 ?  ?End of Session Nurse Communication: Mobility status ? ?Activity Tolerance: Treatment limited secondary to agitation ?Patient left: in bed;with call bell/phone within reach;with bed alarm set;with nursing/sitter in room ? ?OT Visit Diagnosis: Muscle weakness (generalized) (M62.81)  ?              ?Time: [id number] ?OT Time Calculation (min): 22  min ?Charges:  OT General Charges ?$OT Visit: 1 Visit ?OT Evaluation ?$OT Eval Moderate Complexity: 1 Mod ? ?Shanon Payor, OTD OTR/L  ?06/05/21, 4:38 PM  ?

## 2021-06-05 NOTE — Evaluation (Addendum)
Physical Therapy Evaluation ?Patient Details ?Name: Jenna Dudley ?MRN: [id number] ?DOB: [birth year] ?Today's Date: 06/05/2021 ? ?History of Present Illness ? Patient is an 86 year old female with a PMH (+) for chronic diastolic CHF, GERD, bipolar disorder, history of CVA, and right third toe wound. Patient presented to [facility] ED on 06/01/21 for chest pain, and was admitted for Aspiration pneumonia. ?  ?Clinical Impression ? Cleared by MD for evaluation. Physical Therapy Evaluation completed on this date. Patient tolerated session fairly well and was agreeable to treatment. Upon arrival patient was resting in bed, and woke to sternal rub. Patient is a poor historian, call was attempted to husband, however no answer. PLOF and home set up was gathered from previous admission.  ? ?Patient demonstrated generalized weakness in BUE and BLEs, at least 3/5. Patient was able to demonstrate rolling bilaterally at supervision, and required Min A to complete 2 supine to sit transfers through [facility]. When instructed to transfer from EOB to recliner, patient demonstrated impulsive behavior, and stood CGA with RW initially. Patient however quickly moved RW aside and ambulate ~2 feet at [facility] while reaching for recliner. Mild unsteadiness noted during transfer. Upon sitting in recliner patient reported she just wanted to be left alone and refused additional therapy. Patient left in recliner with RN present. Patient would benefit from continued skilled physical therapy in order to optimize patient's safety and independence with ADLs. Recommend HHPT upon discharge from acute hospitalization.   ?   ? ?Recommendations for follow up therapy are one component of a multi-disciplinary discharge planning process, led by the attending physician.  Recommendations may be updated based on patient status, additional functional criteria and insurance authorization. ? ?Follow Up Recommendations Home health PT ? ?  ?Assistance Recommended at Discharge Frequent  or constant Supervision/Assistance  ?Patient can return home with the following ? A little help with walking and/or transfers;Assist for transportation ? ?  ?Equipment Recommendations Other (comment) (will update once better understanding of PLOF and home set up have been established)  ?Recommendations for Other Services ?    ?  ?Functional Status Assessment Patient has had a recent decline in their functional status and demonstrates the ability to make significant improvements in function in a reasonable and predictable amount of time.  ? ?  ?Precautions / Restrictions Precautions ?Precautions: Fall ?Restrictions ?Weight Bearing Restrictions: No  ? ?  ? ?Mobility ? Bed Mobility ?Overal bed mobility: Needs Assistance ?Bed Mobility: Supine to Sit, Rolling ?Rolling: Supervision ?  ?Supine to sit: Min assist ?  ?  ?General bed mobility comments: through HHA ?Patient Response: Impulsive ? ?Transfers ?Overall transfer level: Needs assistance ?Equipment used: Rolling walker (2 wheels) ?Transfers: Sit to/from Stand ?Sit to Stand: Min guard ?  ?  ?  ?  ?  ?  ?  ? ?Ambulation/Gait ?Ambulation/Gait assistance: Min guard ?Gait Distance (Feet): 5 Feet (from EOB to recliner) ?Assistive device: Rolling walker (2 wheels) ?Gait Pattern/deviations: Step-through pattern, Decreased step length - right, Decreased step length - left, Narrow base of support ?  ?  ?  ?General Gait Details: started steps with RW (~3 steps), however patient is impulsive and moved it aside while reaching for recliner to sit down (~2 steps) ? ?Stairs ?  ?  ?  ?  ?  ? ?Wheelchair Mobility ?  ? ?Modified Rankin (Stroke Patients Only) ?  ? ?  ? ?Balance Overall balance assessment: Needs assistance ?Sitting-balance support: Bilateral upper extremity supported, Feet supported ?Sitting balance-Leahy Scale: Fair ?  ?  ?  Standing balance support: Bilateral upper extremity supported, Single extremity supported ?Standing balance-Leahy Scale: Fair ?Standing balance  comment: unsteadiness with functional tasks, patient's impulsiveness ?  ?  ?  ?  ?  ?  ?  ?  ?  ?  ?  ?   ? ? ? ?Pertinent Vitals/Pain Pain Assessment ?Pain Assessment: No/denies pain  ? ? ?Home Living Family/patient expects to be discharged to:: Private residence ?Living Arrangements: Alone ?Available Help at Discharge: Family ?Type of Home: House ?  ?  ?  ?  ?Home Layout: One level ?  ?Additional Comments: Patient quite lethargic. Tells me she lives alone. Unable to answer home set up questions at this time.  ?  ?Prior Function   ?  ?  ?  ?  ?  ?  ?Mobility Comments: most PLOF information gathered from previous evaluations. Pt poor historian ?ADLs Comments: unsure ?  ? ? ?Hand Dominance  ?   ? ?  ?Extremity/Trunk Assessment  ? Upper Extremity Assessment ?Upper Extremity Assessment: Generalized weakness ?  ? ?Lower Extremity Assessment ?Lower Extremity Assessment: Generalized weakness (at least 3/5 strength bilaterally) ?  ? ?   ?Communication  ? Communication: HOH  ?Cognition Arousal/Alertness: Awake/alert ?Behavior During Therapy: [facility] for tasks assessed/performed ?Overall Cognitive Status: Within Functional Limits for tasks assessed ?  ?  ?  ?  ?  ?  ?  ?  ?  ?  ?  ?  ?  ?  ?  ?  ?  ?  ?  ? ?  ?General Comments   ? ?  ?Exercises    ? ?Assessment/Plan  ?  ?PT Assessment Patient needs continued PT services  ?PT Problem List Decreased strength;Decreased balance;Decreased safety awareness;Decreased knowledge of use of DME;Decreased knowledge of precautions ? ?   ?  ?PT Treatment Interventions DME instruction;Therapeutic activities;Gait training;Therapeutic exercise;Patient/family education;Balance training   ? ?PT Goals (Current goals can be found in the Care Plan section)  ?Acute Rehab PT Goals ?Patient Stated Goal: none stated ?Time For Goal Achievement: 06/19/21 ?Potential to Achieve Goals: Good ? ?  ?Frequency Min 2X/week ?  ? ? ?Co-evaluation   ?  ?  ?  ?  ? ? ?  ?AM-PAC PT "6 Clicks" Mobility  ?Outcome Measure  Help needed turning from your back to your side while in a flat bed without using bedrails?: A Little ?Help needed moving from lying on your back to sitting on the side of a flat bed without using bedrails?: A Little ?Help needed moving to and from a bed to a chair (including a wheelchair)?: A Little ?Help needed standing up from a chair using your arms (e.g., wheelchair or bedside chair)?: A Little ?Help needed to walk in hospital room?: A Little ?Help needed climbing 3-5 steps with a railing? : A Lot ?6 Click Score: 17 ? ?  ?End of Session   ?Activity Tolerance: Patient tolerated treatment well ?Patient left: in chair;with nursing/sitter in room;with chair alarm set ?Nurse Communication: Mobility status ?PT Visit Diagnosis: Unsteadiness on feet (R26.81);Muscle weakness (generalized) (M62.81);Difficulty in walking, not elsewhere classified (R26.2) ?  ? ?Time: 1005-1016 ?PT Time Calculation (min) (ACUTE ONLY): 11 min ? ? ?Charges:   PT Evaluation ?$PT Eval Low Complexity: 1 Low ?  ?  ?   ? ?Angelica Ran, PT  ?06/05/21. 10:39 AM ? ? ?

## 2021-06-05 NOTE — Progress Notes (Signed)
ANTICOAGULATION CONSULT NOTE ? ?Pharmacy Consult for Heparin ?Indication: pulmonary embolus ? ?Patient Measurements: ?Height: 5' (152.4 cm) ?Weight: 47.6 kg (105 lb) ?IBW/kg (Calculated) : 45.5 ?Heparin Dosing Weight: 47.6 kg ? ?Labs: ?Recent Labs  ?  06/02/21 ?1223 06/02/21 ?1637 06/03/21 ?0418 06/04/21 ?[id number] 06/04/21 ?1855 06/05/21 ?0436  ?HGB  --    < > 8.8* 8.7*  --  8.4*  ?HCT  --   --  27.4* 27.2*  --  26.7*  ?PLT  --   --  220 243  --  199  ?HEPARINUNFRC 0.11*  --   --   --  <0.10* 0.23*  ?CREATININE  --   --  0.81 0.80  --  0.68  ? < > = values in this interval not displayed.  ? ? ? ?Estimated Creatinine Clearance: 38.3 mL/min (by C-G formula based on SCr of 0.68 mg/dL). ? ? ?Assessment: ?86 year old female admitted with CP found to be a result of segmental/subsegmental PEs in upper and lower lobes. Initial heparin gtt subtherapeutic, gtt running without complications. Hgb low in setting of Anemia of CKD. No active bleeding noted.  ? ?Podiatry following for right third toe osteomyelitis and PAD. Recommending vascular surgery consult. Anticipating toe amputation. ? ?3/11 1855 HL <0.10, subtherapeutic ?3/12 0436 HL 0.23, subtherapeutic ? ?Goal of Therapy:  ?Heparin level 0.3-0.7 units/ml ?Monitor platelets by anticoagulation protocol: Yes ?  ?Plan:  ?Heparin level is subtherapeutic. Will give 700 unit heparin bolus and increase infusion rate to 1250 units/hr. Re-check heparin level 8 hours after rate change. Daily CBC per protocol while on IV heparin; heparin previously discontinued for worsening anemia. ? ?Paulina Fusi, PharmD, BCPS ?06/05/2021 ?5:50 AM ? ? ? ?

## 2021-06-05 NOTE — Plan of Care (Signed)
  Problem: Activity: Goal: Ability to tolerate increased activity will improve Outcome: Progressing   Problem: Clinical Measurements: Goal: Ability to maintain a body temperature in the normal range will improve Outcome: Progressing   Problem: Respiratory: Goal: Ability to maintain adequate ventilation will improve Outcome: Progressing Goal: Ability to maintain a clear airway will improve Outcome: Progressing   Problem: Education: Goal: Knowledge of General Education information will improve Description: Including pain rating scale, medication(s)/side effects and non-pharmacologic comfort measures Outcome: Progressing   

## 2021-06-05 NOTE — Progress Notes (Signed)
Speech Language Pathology Treatment:    ?Patient Details ?Name: Jenna Dudley ?MRN: [id number] ?DOB: [birth year] ?Today's Date: 06/05/2021 ?Time: [id number] ?SLP Time Calculation (min) (ACUTE ONLY): 10 min ? ?Assessment / Plan / Recommendation ?Clinical Impression ? Pt seen for ongoing assessment of swallowing. Pt sleeping in recliner upon SLP entrance to room. Roused to voice without difficulty. On room air. No family present. ? ?Pt observed with bites of pureed (via teaspoon) and sips of thin liquids (via cup sip) from meal tray. No overt s/sx pharyngeal dysphagia, changes to voice, or changes to respiratory status following trials. Oral phase was [facility] across trials. Limited PO intake noted. Offered trials of soft solids, but pt declined despite education.  ? ?Findings from today's re-assessment seem consistent with findings on Friday, 3/10. ? ?Recommend continuation of pureed diet with thin liquids with safe swallowing strategies/aspiration precautions/reflux precautions as outlined below.  ? ?Reviewed diet recommendations, safe swallowing strategies/aspiration precautions, and SLP POC with pt. ?full understanding given AMS/hx dementia. RN made aware of results, recommendations, and SLP POC.  ? ?SLP to f/u per POC for clinical swallowing re-evaluation in 2-3 days pending improvements in participation/willingness to accept POs. Concern for poor PO intake/ability to meet nutritional needs orally. Pt would benefit from RD and Palliative Care consults (if not already following). As mentioned in previous SLP notes, may also benefit from GI consult for assessment of esophageal motility.  ?  ?HPI HPI: Per 36 H&P: pt is a pleasant 86 y.o. female with medical history significant for Delirium w/ Dementia per chart, chronic diastolic CHF, GERD, Bipolar Disorder, history of CVA, and right third toe wound, now presenting to the emergency department for evaluation of chest pain.  Patient reported that she has been  experiencing increased pain at her right third toe for which she was taking Tylenol 3, but then developed chest pain last night.  She has difficulty providing any further information about her symptoms and tends to reply, "I do not know" to any direct questioning.  IV Ativan was administered in the emergency department but the patient was reportedly having difficulty providing history prior to that." CTA chest completed - CTA chest reveals segmental and subsegmental PE involving right upper and right lower lobes without right heart strain.  Also noted on CT chest is right-sided opacity with air-fluid level that likely reflects necrotizing pneumonia with abscess.  There are additional groundglass opacities on CT suspected to represent edema.  Also notable on imaging was patulous esophagus, fluid and debris in the right lower lobe bronchi, and pleural effusions. CT of Abd. 06/02/2021 indicated: "Diffuse increased esophageal thickening consistent with  esophagitis with increased patulous esophagus appearance. Small  hiatal hernia.".  Pt has had a previous thoracentesis at a previous admit 11/2020; and at this admit. ?  ?   ?SLP Plan ? Continue with current plan of care ? ?  ?  ?Recommendations for follow up therapy are one component of a multi-disciplinary discharge planning process, led by the attending physician.  Recommendations may be updated based on patient status, additional functional criteria and insurance authorization. ?  ? ?Recommendations  ?Diet recommendations: Dysphagia 1 (puree);Thin liquid ?Liquids provided via: Cup;Straw ?Medication Administration: Crushed with puree ?Supervision: Patient able to self feed;Staff to assist with self feeding;Intermittent supervision to cue for compensatory strategies ?Compensations: Minimize environmental distractions;Slow rate;Small sips/bites;Lingual sweep for clearance of pocketing;Follow solids with liquid ?Postural Changes and/or Swallow Maneuvers: Out of bed for  meals;Seated upright 90 degrees;Upright 30-60 min after meal (reflux precautions)  ?   ?    ?   ? ? ? ?  General recommendations:  (dietician f/u; Palliative Care) ?Oral Care Recommendations: Oral care BID;Oral care before and after PO;Staff/trained caregiver to provide oral care ?Follow Up Recommendations: Follow physician's recommendations for discharge plan and follow up therapies ?Assistance recommended at discharge: Frequent or constant Supervision/Assistance ?SLP Visit Diagnosis: Dysphagia, unspecified (R13.10) ?Plan: Continue with current plan of care ? ? ? ? ?  ?  ? ?Cherrie Gauze, M.S., CCC-SLP ?Speech-Language Pathologist ?[facility] ?[phone] ([place])  ? ?Jenna Dudley ? ?06/05/2021, 12:46 PM ?

## 2021-06-05 NOTE — Progress Notes (Signed)
PULMONOLOGY         Date: 06/05/2021,   MRN# [id number] Galen DaftJoan M Konicek 06-16-1935     AdmissionWeight: 51.6 kg                 CurrentWeight: 49.9 kg   Referring physician: Dr Chipper HerbZhang   CHIEF COMPLAINT:   Abnormal CT chest imaging with pleural effusion status postthoracentesis   HISTORY OF PRESENT ILLNESS   This is a pleasant 86 year old female with a history of esophageal spasms and GERD, chronic depression, heart failure with preserved ejection fraction, bipolar disorder, right foot wounds, initially came to the emergency department with chest pain.  She reports chest pain is only been for 1 day.  She was found to have mild hypoxemia with SPO2 in the 90s on room air blood work showed anemia which is chronic and macrocytic as well as mild hypokalemia with a potassium of 3.1 she did have lactic acidosis with mild troponin elevation. She had CTPE done with bilateral PE noted. There is additional consolidated area of right lung which appears to be infiltrate consistent with pneumonia. She has not smoked in >40 years and does not have a history of cancer personally not family history of cancer. Fluid profile is PMN predominant thus far with additional studies pending. PCCM consultation for further evaluation and management.   06/03/21- patient is improved able to speak on phone with family. She is being optimized for dc home.  06/04/21- patient is on room air does not reports SOB. She reports pain in RUQ and this is tender to mild palpation. CBD dilation noted on CT abd this admission. Will obtain biliary profile/with LFTs.   06/05/21- patient is stable from pulmonary perspective. She is hypoalbuminemic due to moderate protein calorie malnutrition.    PAST MEDICAL HISTORY   Past Medical History:  Diagnosis Date   Bilateral carpal tunnel syndrome 07/27/2014   Bipolar disorder (HCC)    geropsych admission to [facility] in Dec 2018   Chronic diastolic CHF (congestive heart  failure) (HCC)    a. 03/2017 Echo: EF 55-60%, no rwma, Gr1 DD, mild MR; b. 06/2019 Echo: EF 55-60%, no rwma, mod LVH, GrII DD. Nl RV size/fxn. RVSP 54.296mmHg. Mildly dil LA.   Chronic low back pain 07/29/2014   Degenerative arthritis of lumbar spine 07/27/2014   Dementia (HCC)    Depression    Esophageal spasm 07/29/2014   Gastric reflux 09/17/2014   GERD (gastroesophageal reflux disease)    History of stress test    a. 04/2017 MV: low risk stress test.  EF 55-65%. Probable apical ant/apical, basal, and mid inflat attenuation artifact vs ischemia/scar.   Hyperlipemia 07/29/2014   Hypertension    Pneumonia    Spinal stenosis 07/29/2014     SURGICAL HISTORY   Past Surgical History:  Procedure Laterality Date   HIP SURGERY Right    INNER EAR SURGERY Right    REPLACEMENT TOTAL KNEE BILATERAL Bilateral 07/27/14     FAMILY HISTORY   Family History  Problem Relation Age of Onset   Hypertension Mother    Stroke Mother      SOCIAL HISTORY   Social History   Tobacco Use   Smoking status: Former    Types: Cigarettes    Quit date: 07/29/1975    Years since quitting: 45.8   Smokeless tobacco: Never  Vaping Use   Vaping Use: Never used  Substance Use Topics   Alcohol use: No    Alcohol/week: 0.0 standard  drinks   Drug use: No     MEDICATIONS    Home Medication:    Current Medication:  Current Facility-Administered Medications:    0.9 %  sodium chloride infusion, , Intravenous, PRN, Marrion Coy, MD, Paused at 06/04/21 1324   acetaminophen (TYLENOL) tablet 650 mg, 650 mg, Oral, Q6H PRN, 650 mg at 06/04/21 1647 **OR** acetaminophen (TYLENOL) suppository 650 mg, 650 mg, Rectal, Q6H PRN, Opyd, Lavone Neri, MD   albuterol (PROVENTIL) (2.5 MG/3ML) 0.083% nebulizer solution 2.5 mg, 2.5 mg, Nebulization, Q6H PRN, Marrion Coy, MD   Ampicillin-Sulbactam (UNASYN) 3 g in sodium chloride 0.9 % 100 mL IVPB, 3 g, Intravenous, Q8H, Belue, Lendon Collar, RPH, Last Rate: 200 mL/hr at 06/05/21  0820, 3 g at 06/05/21 0820   cyanocobalamin ((VITAMIN B-12)) injection 1,000 mcg, 1,000 mcg, Intramuscular, Daily, Marrion Coy, MD, 1,000 mcg at 06/05/21 3532   feeding supplement (BOOST / RESOURCE BREEZE) liquid 1 Container, 1 Container, Oral, TID BM, Marrion Coy, MD, 1 Container at 06/04/21 0950   [COMPLETED] heparin bolus via infusion 3,000 Units, 3,000 Units, Intravenous, Once, 3,000 Units at 06/04/21 1322 **FOLLOWED BY** heparin ADULT infusion 100 units/mL (25000 units/292mL), 1,250 Units/hr, Intravenous, Continuous, Foye Deer, RPH, Last Rate: 12.5 mL/hr at 06/05/21 0957, 1,250 Units/hr at 06/05/21 0957   lactated ringers 1,000 mL with potassium chloride 20 mEq infusion, , Intravenous, Continuous, Tressie Ellis, RPH, Last Rate: 50 mL/hr at 06/05/21 0505, Infusion Verify at 06/05/21 0505   MEDLINE mouth rinse, 15 mL, Mouth Rinse, BID, Marrion Coy, MD, 15 mL at 06/04/21 0830   megestrol (MEGACE) 400 MG/10ML suspension 200 mg, 200 mg, Oral, Daily, Marrion Coy, MD, 200 mg at 06/05/21 1015   metoprolol tartrate (LOPRESSOR) tablet 25 mg, 25 mg, Oral, BID, Marrion Coy, MD, 25 mg at 06/05/21 1014   mirtazapine (REMERON) tablet 45 mg, 45 mg, Oral, QHS, Marrion Coy, MD, 45 mg at 06/04/21 2017   multivitamin with minerals tablet 1 tablet, 1 tablet, Oral, Daily, Marrion Coy, MD, 1 tablet at 06/05/21 1014   ondansetron (ZOFRAN) tablet 4 mg, 4 mg, Oral, Q6H PRN **OR** ondansetron (ZOFRAN) injection 4 mg, 4 mg, Intravenous, Q6H PRN, Opyd, Lavone Neri, MD, 4 mg at 06/04/21 2148   pantoprazole (PROTONIX) EC tablet 40 mg, 40 mg, Oral, Daily, Marrion Coy, MD, 40 mg at 06/05/21 1014   polyethylene glycol (MIRALAX / GLYCOLAX) packet 17 g, 17 g, Oral, Daily PRN, Marrion Coy, MD   QUEtiapine (SEROQUEL) tablet 25 mg, 25 mg, Oral, QHS, Marrion Coy, MD, 25 mg at 06/04/21 2018   simvastatin (ZOCOR) tablet 40 mg, 40 mg, Oral, QHS, Zhang, Freda Munro, MD, 40 mg at 06/04/21 2017   sodium chloride flush (NS) 0.9 %  injection 3 mL, 3 mL, Intravenous, Q12H, Opyd, Timothy S, MD, 3 mL at 06/05/21 0823   sucralfate (CARAFATE) tablet 1 g, 1 g, Oral, TID WC & HS, Marrion Coy, MD, 1 g at 06/05/21 1014   vancomycin (VANCOCIN) IVPB 1000 mg/200 mL premix, 1,000 mg, Intravenous, Q36H, Ronnald Ramp, RPH, Stopped at 06/04/21 2146   venlafaxine XR (EFFEXOR-XR) 24 hr capsule 75 mg, 75 mg, Oral, Q breakfast, Marrion Coy, MD, 75 mg at 06/05/21 1014  Facility-Administered Medications Ordered in Other Encounters:    0.9 %  sodium chloride infusion, , , Continuous PRN, Lily Kocher, CRNA, Continued from Pre-op at 08/07/18 1110   lactated ringers infusion, , Intravenous, Continuous, Clapacs, Jackquline Denmark, MD, New Bag at 07/09/19 1014    ALLERGIES  Aspirin and Sulfa antibiotics     REVIEW OF SYSTEMS    Review of Systems:  Gen:  Denies  fever, sweats, chills weigh loss  HEENT: Denies blurred vision, double vision, ear pain, eye pain, hearing loss, nose bleeds, sore throat Cardiac:  No dizziness, chest pain or heaviness, chest tightness,edema Resp:   Denies cough or sputum porduction, shortness of breath,wheezing, hemoptysis,  Gi: Denies swallowing difficulty, stomach pain, nausea or vomiting, diarrhea, constipation, bowel incontinence Gu:  Denies bladder incontinence, burning urine Ext:   Denies Joint pain, stiffness or swelling Skin: Denies  skin rash, easy bruising or bleeding or hives Endoc:  Denies polyuria, polydipsia , polyphagia or weight change Psych:   Denies depression, insomnia or hallucinations   Other:  All other systems negative   VS: BP (!) 150/60 (BP Location: Left Arm)    Pulse 79    Temp 98.2 F (36.8 C)    Resp 18    Ht 5' (1.524 m)    Wt 49.9 kg    LMP  (LMP Unknown)    SpO2 90%    BMI 21.48 kg/m      PHYSICAL EXAM    GENERAL:NAD, no fevers, chills, no weakness no fatigue HEAD: Normocephalic, atraumatic.  EYES: Pupils equal, round, reactive to light. Extraocular muscles intact. No  scleral icterus.  MOUTH: Moist mucosal membrane. Dentition intact. No abscess noted.  EAR, NOSE, THROAT: Clear without exudates. No external lesions.  NECK: Supple. No thyromegaly. No nodules. No JVD.  PULMONARY: mild rhonchi bilaterally  CARDIOVASCULAR: S1 and S2. Regular rate and rhythm. No murmurs, rubs, or gallops. No edema. Pedal pulses 2+ bilaterally.  GASTROINTESTINAL: Soft, nontender, nondistended. No masses. Positive bowel sounds. No hepatosplenomegaly.  MUSCULOSKELETAL: No swelling, clubbing, or edema. Range of motion full in all extremities.  NEUROLOGIC: Cranial nerves II through XII are intact. No gross focal neurological deficits. Sensation intact. Reflexes intact.  SKIN: No ulceration, lesions, rashes, or cyanosis. Skin warm and dry. Turgor intact.  PSYCHIATRIC: Mood, affect within normal limits. The patient is awake, alert and oriented x 3. Insight, judgment intact.       IMAGING    CT Angio Chest PE W/Cm &/Or Wo Cm  Result Date: 06/02/2021 CLINICAL DATA:  Chest and abdomen pain. EXAM: CT ANGIOGRAPHY CHEST CT ABDOMEN AND PELVIS WITH CONTRAST TECHNIQUE: Multidetector CT imaging of the chest was performed using the standard protocol during bolus administration of intravenous contrast. Multiplanar CT image reconstructions and MIPs were obtained to evaluate the vascular anatomy. Multidetector CT imaging of the abdomen and pelvis was performed using the standard protocol during bolus administration of intravenous contrast. RADIATION DOSE REDUCTION: This exam was performed according to the departmental dose-optimization program which includes automated exposure control, adjustment of the mA and/or kV according to patient size and/or use of iterative reconstruction technique. CONTRAST:  75mL OMNIPAQUE IOHEXOL 350 MG/ML SOLN COMPARISON:  Abdomen and pelvis CT with contrast 12/11/2020, CT chest, abdomen pelvis with contrast 12/20/2017, CTA chest 04/17/2017 FINDINGS: CTA CHEST FINDINGS  Cardiovascular: Pulmonary arteries are upper limit of normal in caliber. Nonoccluding linear thrombotic filling defect is seen in right upper lobe segmental and 1 or possibly 2 subsegmental upstream arteries to the apical area and is also seen in 3 right lower lobe segmental and several downstream subsegmental arteries. Overall clot burden is small without findings of acute right heart strain. There is cardiomegaly with left chamber predominance, three-vessel calcific CAD, and thinning and fatty infiltration in the apical myocardium again noted consistent with  a prior apical infarct. No pericardial effusion is seen. No other pulmonary embolus is seen. Superior pulmonary veins are slightly prominent but no more than previously. There are calcifications of the mitral ring, and heavy aortic calcific atherosclerosis,without aortic aneurysm, penetrating ulcer or dissection. There is atherosclerosis in the great vessels without flow-limiting stenosis. Mediastinum/Nodes: Axillary spaces are clear. The thyroid gland is unremarkable. There is interval increased diffuse thickening and patulous Ness of the esophagus. Small hiatal hernia. Findings may suggest diffuse esophagitis. Endoscopic follow-up may be indicated. There are increasingly mildly enlarged lymph nodes in the AP window, precarinal and subcarinal mediastinum, and mildly prominent lymph nodes in the lower right hilum. No tracheal lesion or fluid is seen. The central airways are clear and normal caliber. There is no bulky or encasing adenopathy. Lungs/Pleura: A moderate-to-large right pleural effusion is again demonstrated, similar to 12/11/2020 but was not seen in 2019. There is a small layering left pleural effusion which is new from both prior studies. Increased subpleural interstitial markings in the lung bases and apices are noted probably due to interstitial edema. In the right lower lobe anterior basal area there is fluid and debris in the area segmental  bronchi and multiple downstream small bronchi. A confluent opacity extending from the hilum to the anterolateral pleural surface has developed in this segment measuring 6.3 by 3.0 cm on series 3 axial 79 with only scant air bronchograms only in its medial aspect. The area of consolidation contains an air-fluid level measuring 1.6 x 1.3 cm on axial 75 which is worrisome for a small pulmonary abscess with possibly necrotizing pneumonic process. Underlying mass is difficult to exclude but should not have reached this size in 6 months. There are additional scattered ground-glass opacities in the more central lungs bilaterally and in the peripheral partially aerated right lower lobe, which could be ground-glass edema, pneumonitis or combination. There is compressive atelectasis along side the right pleural effusion. Musculoskeletal: There is dextroscoliosis and multilevel bridging enthesopathy of the thoracic spine. No acute or significant osseous findings. Review of the MIP images confirms the above findings. CT ABDOMEN and PELVIS FINDINGS Hepatobiliary: As previously the liver enhancement is heterogeneous initially but homogeneous on delayed phase compatible with perfusional differences. There is no mass enhancement. Post cholecystectomy intrahepatic and extrahepatic biliary dilatation is seen with common bile duct 1.8 cm, previously 1.6 cm. There is no appreciable ductal stone no increased prominence in the pre-existing intrahepatic biliary dilatation. Pancreas: Atrophic without appreciable mass or ductal dilatation. Spleen: Normal in size and enhancement. Adrenals/Urinary Tract: There is no adrenal mass. There is scattered cortical scarring of the kidneys without visible cortical mass. There is no evidence of urinary stone or obstruction. No focal bladder abnormality. Stomach/Bowel: Small hiatal hernia. Chronic thickened folds in the stomach unchanged. No bowel obstruction or inflammation. An appendix is not seen.  There is mild fecal stasis, without evidence of colitis/diverticulitis. Vascular/Lymphatic: Heavy extensive calcification in the aorta, iliac and visceral branch arteries is again noted. There is no AAA. Major branch vessels appear patent. The portal and splenic veins are patent and normal caliber. There is no adenopathy. Reproductive: Surgically absent uterus.  No adnexal mass. Other: There is body wall anasarca. There is trace ascites in the pelvis. Musculoskeletal: There is advanced degenerative change of the lumbar spine and chronic wedging of the T12 and L1 vertebrae, multilevel acquired spinal canal and foraminal stenosis lumbar spine. No acute regional skeletal findings. Mild-to-moderate hip DJD. Review of the MIP images confirms the above findings.  IMPRESSION: 1. Nonoccluding linear thrombus in segmental and subsegmental arteries in the right upper and lower lobes, overall small clot burden common, no findings of acute right heart strain. 2. 6.3 x 3.0 cm confluent opacity from the hilum to the pleural surface, anterior basal segment right lower lobe. There are mildly enlarged mediastinal and hilar lymph nodes which could be reactive but are nonspecific. 3. Underlying mass difficult to exclude but this is more likely a necrotizing pneumonic process and contains a small 1.6 cm air-fluid level which is probably an abscess. There is mucoid debris or fluid opacifying segmental and downstream subsegmental bronchi in the area. 4. Cardiomegaly, calcific CAD, aortic atherosclerosis and chronic apical left ventricular infarct. 5. Evidence of at least mild interstitial edema, moderate-to-large left right pleural effusion which was seen on 12/11/2020, and new small left pleural effusion with increased body wall edema. 6. Additional ground-glass opacities which could indicate additional edema and or pneumonitis. 7. Again noted intrahepatic and extrahepatic postcholecystectomy biliary dilatation. It is not significantly  changed since 12/11/2020 but has increased since 2019. Laboratory and clinical correlation advised. MRCP or ERCP may be helpful if warranted. 8. Diffuse increased esophageal thickening consistent with esophagitis with increased patulous esophagus appearance. Small hiatal hernia. Consider endoscopic follow-up if warranted. 9. Minimal pelvic ascites and remaining findings discussed above. 10. Discussed over the phone with Dr. Dolores Frame at 3:50 a.m., 06/02/2021. Electronically Signed   By: Almira Bar M.D.   On: 06/02/2021 03:51   CT Abdomen Pelvis W Contrast  Result Date: 06/02/2021 CLINICAL DATA:  Chest and abdomen pain. EXAM: CT ANGIOGRAPHY CHEST CT ABDOMEN AND PELVIS WITH CONTRAST TECHNIQUE: Multidetector CT imaging of the chest was performed using the standard protocol during bolus administration of intravenous contrast. Multiplanar CT image reconstructions and MIPs were obtained to evaluate the vascular anatomy. Multidetector CT imaging of the abdomen and pelvis was performed using the standard protocol during bolus administration of intravenous contrast. RADIATION DOSE REDUCTION: This exam was performed according to the departmental dose-optimization program which includes automated exposure control, adjustment of the mA and/or kV according to patient size and/or use of iterative reconstruction technique. CONTRAST:  75mL OMNIPAQUE IOHEXOL 350 MG/ML SOLN COMPARISON:  Abdomen and pelvis CT with contrast 12/11/2020, CT chest, abdomen pelvis with contrast 12/20/2017, CTA chest 04/17/2017 FINDINGS: CTA CHEST FINDINGS Cardiovascular: Pulmonary arteries are upper limit of normal in caliber. Nonoccluding linear thrombotic filling defect is seen in right upper lobe segmental and 1 or possibly 2 subsegmental upstream arteries to the apical area and is also seen in 3 right lower lobe segmental and several downstream subsegmental arteries. Overall clot burden is small without findings of acute right heart strain. There is  cardiomegaly with left chamber predominance, three-vessel calcific CAD, and thinning and fatty infiltration in the apical myocardium again noted consistent with a prior apical infarct. No pericardial effusion is seen. No other pulmonary embolus is seen. Superior pulmonary veins are slightly prominent but no more than previously. There are calcifications of the mitral ring, and heavy aortic calcific atherosclerosis,without aortic aneurysm, penetrating ulcer or dissection. There is atherosclerosis in the great vessels without flow-limiting stenosis. Mediastinum/Nodes: Axillary spaces are clear. The thyroid gland is unremarkable. There is interval increased diffuse thickening and patulous Ness of the esophagus. Small hiatal hernia. Findings may suggest diffuse esophagitis. Endoscopic follow-up may be indicated. There are increasingly mildly enlarged lymph nodes in the AP window, precarinal and subcarinal mediastinum, and mildly prominent lymph nodes in the lower right hilum. No tracheal lesion or  fluid is seen. The central airways are clear and normal caliber. There is no bulky or encasing adenopathy. Lungs/Pleura: A moderate-to-large right pleural effusion is again demonstrated, similar to 12/11/2020 but was not seen in 2019. There is a small layering left pleural effusion which is new from both prior studies. Increased subpleural interstitial markings in the lung bases and apices are noted probably due to interstitial edema. In the right lower lobe anterior basal area there is fluid and debris in the area segmental bronchi and multiple downstream small bronchi. A confluent opacity extending from the hilum to the anterolateral pleural surface has developed in this segment measuring 6.3 by 3.0 cm on series 3 axial 79 with only scant air bronchograms only in its medial aspect. The area of consolidation contains an air-fluid level measuring 1.6 x 1.3 cm on axial 75 which is worrisome for a small pulmonary abscess with  possibly necrotizing pneumonic process. Underlying mass is difficult to exclude but should not have reached this size in 6 months. There are additional scattered ground-glass opacities in the more central lungs bilaterally and in the peripheral partially aerated right lower lobe, which could be ground-glass edema, pneumonitis or combination. There is compressive atelectasis along side the right pleural effusion. Musculoskeletal: There is dextroscoliosis and multilevel bridging enthesopathy of the thoracic spine. No acute or significant osseous findings. Review of the MIP images confirms the above findings. CT ABDOMEN and PELVIS FINDINGS Hepatobiliary: As previously the liver enhancement is heterogeneous initially but homogeneous on delayed phase compatible with perfusional differences. There is no mass enhancement. Post cholecystectomy intrahepatic and extrahepatic biliary dilatation is seen with common bile duct 1.8 cm, previously 1.6 cm. There is no appreciable ductal stone no increased prominence in the pre-existing intrahepatic biliary dilatation. Pancreas: Atrophic without appreciable mass or ductal dilatation. Spleen: Normal in size and enhancement. Adrenals/Urinary Tract: There is no adrenal mass. There is scattered cortical scarring of the kidneys without visible cortical mass. There is no evidence of urinary stone or obstruction. No focal bladder abnormality. Stomach/Bowel: Small hiatal hernia. Chronic thickened folds in the stomach unchanged. No bowel obstruction or inflammation. An appendix is not seen. There is mild fecal stasis, without evidence of colitis/diverticulitis. Vascular/Lymphatic: Heavy extensive calcification in the aorta, iliac and visceral branch arteries is again noted. There is no AAA. Major branch vessels appear patent. The portal and splenic veins are patent and normal caliber. There is no adenopathy. Reproductive: Surgically absent uterus.  No adnexal mass. Other: There is body wall  anasarca. There is trace ascites in the pelvis. Musculoskeletal: There is advanced degenerative change of the lumbar spine and chronic wedging of the T12 and L1 vertebrae, multilevel acquired spinal canal and foraminal stenosis lumbar spine. No acute regional skeletal findings. Mild-to-moderate hip DJD. Review of the MIP images confirms the above findings. IMPRESSION: 1. Nonoccluding linear thrombus in segmental and subsegmental arteries in the right upper and lower lobes, overall small clot burden common, no findings of acute right heart strain. 2. 6.3 x 3.0 cm confluent opacity from the hilum to the pleural surface, anterior basal segment right lower lobe. There are mildly enlarged mediastinal and hilar lymph nodes which could be reactive but are nonspecific. 3. Underlying mass difficult to exclude but this is more likely a necrotizing pneumonic process and contains a small 1.6 cm air-fluid level which is probably an abscess. There is mucoid debris or fluid opacifying segmental and downstream subsegmental bronchi in the area. 4. Cardiomegaly, calcific CAD, aortic atherosclerosis and chronic apical left  ventricular infarct. 5. Evidence of at least mild interstitial edema, moderate-to-large left right pleural effusion which was seen on 12/11/2020, and new small left pleural effusion with increased body wall edema. 6. Additional ground-glass opacities which could indicate additional edema and or pneumonitis. 7. Again noted intrahepatic and extrahepatic postcholecystectomy biliary dilatation. It is not significantly changed since 12/11/2020 but has increased since 2019. Laboratory and clinical correlation advised. MRCP or ERCP may be helpful if warranted. 8. Diffuse increased esophageal thickening consistent with esophagitis with increased patulous esophagus appearance. Small hiatal hernia. Consider endoscopic follow-up if warranted. 9. Minimal pelvic ascites and remaining findings discussed above. 10. Discussed over  the phone with Dr. Dolores Frame at 3:50 a.m., 06/02/2021. Electronically Signed   By: Almira Bar M.D.   On: 06/02/2021 03:51   MR TOES RIGHT WO CONTRAST  Result Date: 06/03/2021 CLINICAL DATA:  Osteomyelitis of the third digit. EXAM: MRI OF THE RIGHT TOES WITHOUT CONTRAST TECHNIQUE: Multiplanar, multisequence MR imaging of the right forefoot was performed. No intravenous contrast was administered. COMPARISON:  None. FINDINGS: Bones/Joint/Cartilage Soft tissue wound along the plantar aspect of the third MTP joint. Severe bone marrow edema in the third metatarsal head, third proximal phalanx, third middle phalanx and third distal phalanx most consistent with osteomyelitis. Moderate osteoarthritis of the talonavicular joint. 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 Moderate osteoarthritis of the fourth tarsometatarsal joint. Moderate osteoarthritis of the first MTP joint. Severe osteoarthritic changes of the medial hallux sesamoid-moderate tarsal articulation. No joint effusion. Ligaments Collateral ligaments are intact.  Lisfranc ligament is intact. Muscles and Tendons Flexor, peroneal and extensor compartment tendons are intact. T2 hyperintense signal throughout the plantar musculature likely neurogenic. Soft tissue Fluid collection along the plantar aspect of the third metatarsal in the subcutaneous fat measuring 8 x 4 x 43 mm. No soft tissue mass. IMPRESSION: 1. Soft tissue wound along the plantar aspect of the third MTP joint. Severe bone marrow edema in the third metatarsal head, third proximal phalanx, third middle phalanx and third distal phalanx most consistent with osteomyelitis. 2. Fluid collection along the plantar aspect of the third metatarsal in the subcutaneous fat measuring 8 x 4 x 43 mm concerning for a small abscess. 3. 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 4. Moderate osteoarthritis of the talonavicular joint. 5. Moderate osteoarthritis of  the first MTP joint. 6. Severe osteoarthritic changes of the medial hallux sesamoid-moderate tarsal articulation. Electronically Signed   By: Elige Ko M.D.   On: 06/03/2021 10:34   US ARTERIAL LOWER EXTREMITY DUPLEX RIGHT(NON-ABI)  Result Date: 06/03/2021 CLINICAL DATA:  Osteomyelitis, right third toe wound EXAM: RIGHT LOWER EXTREMITY ARTERIAL DUPLEX SCAN TECHNIQUE: Gray-scale sonography as well as color Doppler and duplex ultrasound was performed to evaluate the lower extremity arteries including the common, superficial and profunda femoral arteries, popliteal artery and calf arteries. COMPARISON:  None. FINDINGS: Right lower Extremity Inflow: Normal common femoral arterial waveforms and velocities. No evidence of inflow (aortoiliac) disease. Outflow: The superficial femoral artery and popliteal artery demonstrate dampened and monophasic waveforms. Runoff: The anterior tibial artery appears occluded. Dampened waveforms of the posterior tibial artery. IMPRESSION: 1. Normal right common femoral artery. 2. The superficial femoral and popliteal arteries demonstrate dampened and monophasic waveforms, suggestive of proximal disease although a culprit lesion is not definitively identified on this exam. 3. The anterior tibial artery appears occluded. Dampened waveforms of the posterior tibial artery. Electronically Signed   By: Olive Bass M.D.   On: 06/03/2021 12:46  DG Chest Port 1 View  Result Date: 06/02/2021 CLINICAL DATA:  Status post RIGHT thoracentesis EXAM: PORTABLE CHEST 1 VIEW COMPARISON:  Radiograph 06/01/2021, CT 06/02/2021 FINDINGS: Reduction in RIGHT pleural fluid following thoracentesis. Masslike consolidation remains in the RIGHT midlung. No pneumothorax. LEFT lung clear. IMPRESSION: 1. No pneumothorax following RIGHT thoracentesis. 2. Decrease in RIGHT pleural fluid volume. Electronically Signed   By: Genevive Bi M.D.   On: 06/02/2021 15:23   DG Chest Port 1 View  Result Date:  06/01/2021 CLINICAL DATA:  Mid chest pain. EXAM: PORTABLE CHEST 1 VIEW COMPARISON:  April 10, 2021 FINDINGS: Mild, diffusely increased interstitial lung markings are seen with mild prominence of the pulmonary vasculature. There is mild right suprahilar and infrahilar peribronchial cuffing with marked severity airspace disease seen within the mid right lung. There is a small right pleural effusion. Elevation of the right hemidiaphragm is noted. No pneumothorax is identified. The heart size and mediastinal contours are within normal limits. There is marked severity calcification of the aortic arch. Degenerative changes seen throughout the thoracic spine. IMPRESSION: 1. Marked severity infiltrate within the mid right lung, with a superimposed component of congestive heart failure. 2. Small right pleural effusion. Electronically Signed   By: Aram Candela M.D.   On: 06/01/2021 23:43   DG Foot Complete Right  Result Date: 06/02/2021 CLINICAL DATA:  Right foot third toe injury 04/05/2021. EXAM: RIGHT FOOT COMPLETE - 3+ VIEW COMPARISON:  Right foot plain films 04/10/2021. FINDINGS: Osteopenia. No fracture or erosive lesion is seen. Hammertoe deformities limited assessment of the phalanges. There was previously disproportionate soft tissue swelling of third toe which is no longer seen. There are vascular calcifications in the distal foreleg. Hallux valgus and advanced first MTP joint DJD are again shown, with osteophytes along the articulation of the hallux head with the hallux sesamoid ossicles. There is additional joint narrowing and osteophytes of the tarsometatarsal and tarsal tarsal joints, moderate nonerosive plantar heel spur and a small nonerosive posterior calcaneal spur. IMPRESSION: 1. Limited assessment of the phalanges due to hammertoe deformities. No acute abnormality within study limitations. 2. Disproportionate swelling previously was noted in the third toe but not seen today. 3. Osteopenia and  degenerative change, chronic hallux valgus. 4. Vascular calcifications in the distal foreleg. Electronically Signed   By: Almira Bar M.D.   On: 06/02/2021 01:27   ECHOCARDIOGRAM COMPLETE  Result Date: 06/03/2021    ECHOCARDIOGRAM REPORT   Patient Name:   MOLLEIGH HUOT Date of Exam: 06/03/2021 Medical Rec #:  [id number]     Height:       60.0 in Accession #:    [id number]    Weight:       105.0 lb Date of Birth:  08-04-1935     BSA:          1.419 m Patient Age:    85 years      BP:           146/74 mmHg Patient Gender: F             HR:           91 bpm. Exam Location:  [facility] Procedure: 2D Echo, Color Doppler and Cardiac Doppler Indications:     I26.09 Pulmonary Embolus; Elevated troponin  History:         Patient has prior history of Echocardiogram examinations, most                  recent 12/11/2020. CHF;  Risk Factors:Hypertension and                  Dyslipidemia.  Sonographer:     Humphrey Rolls Referring Phys:  [id number] Lavone Neri OPYD Diagnosing Phys: Julien Nordmann MD IMPRESSIONS  1. Left ventricular ejection fraction, by estimation, is 55 to 60%. The left ventricle has normal function. The left ventricle has no regional wall motion abnormalities. There is moderate left ventricular hypertrophy. Left ventricular diastolic parameters are indeterminate.  2. Right ventricular systolic function is normal. The right ventricular size is normal. There is severely elevated pulmonary artery systolic pressure. The estimated right ventricular systolic pressure is 85.3 mmHg.  3. Left atrial size was mildly dilated.  4. The mitral valve is normal in structure. Moderate to severe mitral valve regurgitation. No evidence of mitral stenosis.  5. Tricuspid valve regurgitation is moderate to severe.  6. The aortic valve was not well visualized. Aortic valve regurgitation is not visualized. No aortic stenosis is present.  7. The inferior vena cava is normal in size with <50% respiratory variability, suggesting right atrial  pressure of 8 mmHg. FINDINGS  Left Ventricle: Left ventricular ejection fraction, by estimation, is 55 to 60%. The left ventricle has normal function. The left ventricle has no regional wall motion abnormalities. The left ventricular internal cavity size was normal in size. There is  moderate left ventricular hypertrophy. Left ventricular diastolic parameters are indeterminate. Right Ventricle: The right ventricular size is normal. No increase in right ventricular wall thickness. Right ventricular systolic function is normal. There is severely elevated pulmonary artery systolic pressure. The tricuspid regurgitant velocity is 4.48 m/s, and with an assumed right atrial pressure of 5 mmHg, the estimated right ventricular systolic pressure is 85.3 mmHg. Left Atrium: Left atrial size was mildly dilated. Right Atrium: Right atrial size was normal in size. Pericardium: There is no evidence of pericardial effusion. Mitral Valve: The mitral valve is normal in structure. Mild mitral annular calcification. Moderate to severe mitral valve regurgitation. No evidence of mitral valve stenosis. MV peak gradient, 8.0 mmHg. The mean mitral valve gradient is 3.0 mmHg. Tricuspid Valve: The tricuspid valve is normal in structure. Tricuspid valve regurgitation is moderate to severe. No evidence of tricuspid stenosis. Aortic Valve: The aortic valve was not well visualized. Aortic valve regurgitation is not visualized. No aortic stenosis is present. Aortic valve mean gradient measures 8.0 mmHg. Aortic valve peak gradient measures 15.2 mmHg. Aortic valve area, by VTI measures 0.75 cm. Pulmonic Valve: The pulmonic valve was normal in structure. Pulmonic valve regurgitation is not visualized. No evidence of pulmonic stenosis. Aorta: The aortic root is normal in size and structure. Venous: The inferior vena cava is normal in size with less than 50% respiratory variability, suggesting right atrial pressure of 8 mmHg. IAS/Shunts: No atrial  level shunt detected by color flow Doppler.  LEFT VENTRICLE PLAX 2D LVIDd:         3.77 cm   Diastology LVIDs:         3.31 cm   LV e' medial:    5.33 cm/s LV PW:         1.57 cm   LV E/e' medial:  21.8 LV IVS:        1.16 cm   LV e' lateral:   5.22 cm/s LVOT diam:     1.50 cm   LV E/e' lateral: 22.2 LV SV:         27 LV SV Index:   19 LVOT Area:  1.77 cm  RIGHT VENTRICLE RV Basal diam:  2.74 cm RV S prime:     7.94 cm/s LEFT ATRIUM             Index        RIGHT ATRIUM           Index LA diam:        4.50 cm 3.17 cm/m   RA Area:     17.50 cm LA Vol (A2C):   55.7 ml 39.24 ml/m  RA Volume:   49.50 ml  34.87 ml/m LA Vol (A4C):   61.2 ml 43.12 ml/m LA Biplane Vol: 63.0 ml 44.38 ml/m  AORTIC VALVE                     PULMONIC VALVE AV Area (Vmax):    0.69 cm      PV Vmax:       0.68 m/s AV Area (Vmean):   0.68 cm      PV Vmean:      47.800 cm/s AV Area (VTI):     0.75 cm      PV VTI:        0.107 m AV Vmax:           195.00 cm/s   PV Peak grad:  1.9 mmHg AV Vmean:          133.000 cm/s  PV Mean grad:  1.0 mmHg AV VTI:            0.355 m AV Peak Grad:      15.2 mmHg AV Mean Grad:      8.0 mmHg LVOT Vmax:         75.70 cm/s LVOT Vmean:        51.500 cm/s LVOT VTI:          0.150 m LVOT/AV VTI ratio: 0.42  AORTA Ao Root diam: 2.70 cm MITRAL VALVE                TRICUSPID VALVE MV Area (PHT): 4.17 cm     TR Peak grad:   80.3 mmHg MV Area VTI:   1.35 cm     TR Vmax:        448.00 cm/s MV Peak grad:  8.0 mmHg MV Mean grad:  3.0 mmHg     SHUNTS MV Vmax:       1.41 m/s     Systemic VTI:  0.15 m MV Vmean:      76.5 cm/s    Systemic Diam: 1.50 cm MV Decel Time: 182 msec MV E velocity: 116.00 cm/s MV A velocity: 66.80 cm/s MV E/A ratio:  1.74 Julien Nordmann MD Electronically signed by Julien Nordmann MD Signature Date/Time: 06/03/2021/12:43:56 PM    Final    US THORACENTESIS ASP PLEURAL SPACE W/IMG GUIDE  Result Date: 06/02/2021 INDICATION: Patient with a history of heart failure presents today with right pleural  effusion. Interventional radiology asked to perform a diagnostic and therapeutic thoracentesis. EXAM: ULTRASOUND GUIDED THORACENTESIS MEDICATIONS: 1% lidocaine 10 mL COMPLICATIONS: None immediate. PROCEDURE: An ultrasound guided thoracentesis was thoroughly discussed with the patient and questions answered. The benefits, risks, alternatives and complications were also discussed. The patient understands and wishes to proceed with the procedure. Written consent was obtained. Ultrasound was performed to localize and mark an adequate pocket of fluid in the right chest. The area was then prepped and draped in the normal sterile fashion. 1% Lidocaine was used for local anesthesia.  Under ultrasound guidance a 6 Fr Safe-T-Centesis catheter was introduced. Thoracentesis was performed. The catheter was removed and a dressing applied. FINDINGS: A total of approximately 1.2 L of clear yellow fluid was removed. Samples were sent to the laboratory as requested by the clinical team. IMPRESSION: Successful ultrasound guided right thoracentesis yielding 1.2 of pleural fluid. Read by: Alwyn Ren, NP Electronically Signed   By: Olive Bass M.D.   On: 06/02/2021 15:52      ASSESSMENT/PLAN   Right lung consolidated pneumonia Recent COVID 19 infection 04/10/21 -this may be post viral LRTI bacterial pneuomonia vs malignancy -paitent on vancomycin and Unasyn currently  - MRSA pcr  -cytology from thoracentesis is pending -strep pneumonand legionella negative -possible aspiratoin due to mucoid debris or fluid opacifying segmental RLL    Right pleural effusion   - neutrophil predominant speciment with other labs still in process  -s/p 1.2L fluid removed.    Non-massive PE -Nonoccluding linear thrombus in segmental and subsegmental arteries in the right upper and lower lobes -on heparin currently may transition to eliquis when appropriate    Hiatal hernia with esophagitis and mucosal thickening    GI  consultation  -on carafate  -reports RUQ pain , LFTwith low albumin  Thank you for allowing me to participate in the care of this patient.   Patient has at least 1 life-threatening severe medical condition which is being evaluated managed during this patient visit Patient/Family are satisfied with care plan and all questions have been answered.  This document was prepared using Dragon voice recognition software and may include unintentional dictation errors.     Vida Rigger, M.D.  Division of Pulmonary & Critical Care Medicine  [facility]

## 2021-06-05 NOTE — Progress Notes (Signed)
ANTICOAGULATION CONSULT NOTE ? ?Pharmacy Consult for Heparin ?Indication: pulmonary embolus ? ?Patient Measurements: ?Height: 5' (152.4 cm) ?Weight: 49.9 kg (110 lb 0.2 oz) ?IBW/kg (Calculated) : 45.5 ?Heparin Dosing Weight: 47.6 kg ? ?Labs: ?Recent Labs  ?  06/03/21 ?0418 06/04/21 ?6599 06/04/21 ?1855 06/05/21 ?0436 06/05/21 ?1352  ?HGB 8.8* 8.7*  --  8.4*  --   ?HCT 27.4* 27.2*  --  26.7*  --   ?PLT 220 243  --  199  --   ?HEPARINUNFRC  --   --  <0.10* 0.23* 0.33  ?CREATININE 0.81 0.80  --  0.68  --   ? ? ? ?Estimated Creatinine Clearance: 38.3 mL/min (by C-G formula based on SCr of 0.68 mg/dL). ? ? ?Assessment: ?86 year old female admitted with CP found to be a result of segmental/subsegmental PEs in upper and lower lobes. Initial heparin gtt subtherapeutic, gtt running without complications. Hgb low in setting of Anemia of CKD. No active bleeding noted.  ? ?Podiatry following for right third toe osteomyelitis and PAD. Recommending vascular surgery consult. Anticipating toe amputation. ? ?Goal of Therapy:  ?Heparin level 0.3-0.7 units/ml ?Monitor platelets by anticoagulation protocol: Yes ?  ?Plan:  ?--Heparin level is therapeutic x 2 ?--Continue heparin infusion at 1250 units/hr ?--check heparin level once daily with am labs ?--Daily CBC per protocol while on IV heparin ? ?Lowella Bandy  ?06/05/2021 ?9:53 PM ? ? ? ?

## 2021-06-06 ENCOUNTER — Encounter
Admission: EM | Disposition: A | Discharge status: Skilled Nursing Facility | Payer: Self-pay | Source: Home / Self Care | Attending: Internal Medicine

## 2021-06-06 DIAGNOSIS — I97618 Postprocedural hemorrhage and hematoma of a circulatory system organ or structure following other circulatory system procedure: Secondary | ICD-10-CM

## 2021-06-06 DIAGNOSIS — J850 Gangrene and necrosis of lung: Secondary | ICD-10-CM | POA: Diagnosis not present

## 2021-06-06 DIAGNOSIS — J690 Pneumonitis due to inhalation of food and vomit: Secondary | ICD-10-CM | POA: Diagnosis not present

## 2021-06-06 DIAGNOSIS — M869 Osteomyelitis, unspecified: Secondary | ICD-10-CM

## 2021-06-06 DIAGNOSIS — R630 Anorexia: Secondary | ICD-10-CM | POA: Diagnosis not present

## 2021-06-06 DIAGNOSIS — I2699 Other pulmonary embolism without acute cor pulmonale: Secondary | ICD-10-CM | POA: Diagnosis not present

## 2021-06-06 DIAGNOSIS — L97519 Non-pressure chronic ulcer of other part of right foot with unspecified severity: Secondary | ICD-10-CM

## 2021-06-06 DIAGNOSIS — I70235 Atherosclerosis of native arteries of right leg with ulceration of other part of foot: Secondary | ICD-10-CM

## 2021-06-06 HISTORY — PX: LOWER EXTREMITY ANGIOGRAPHY: CATH118251

## 2021-06-06 LAB — CBC WITH DIFFERENTIAL/PLATELET
Abs Immature Granulocytes: 0 10*3/uL (ref 0.00–0.07)
Band Neutrophils: 27 %
Basophils Absolute: 0 10*3/uL (ref 0.0–0.1)
Basophils Relative: 0 %
Eosinophils Absolute: 0 10*3/uL (ref 0.0–0.5)
Eosinophils Relative: 1 %
HCT: 25.8 % — ABNORMAL LOW (ref 36.0–46.0)
Hemoglobin: 8.2 g/dL — ABNORMAL LOW (ref 12.0–15.0)
Lymphocytes Relative: 17 %
Lymphs Abs: 0.7 10*3/uL (ref 0.7–4.0)
MCH: 35.5 pg — ABNORMAL HIGH (ref 26.0–34.0)
MCHC: 31.8 g/dL (ref 30.0–36.0)
MCV: 111.7 fL — ABNORMAL HIGH (ref 80.0–100.0)
Monocytes Absolute: 0.6 10*3/uL (ref 0.1–1.0)
Monocytes Relative: 15 %
Neutro Abs: 2.9 10*3/uL (ref 1.7–7.7)
Neutrophils Relative %: 40 %
Platelets: 157 10*3/uL (ref 150–400)
RBC: 2.31 MIL/uL — ABNORMAL LOW (ref 3.87–5.11)
RDW: 19.5 % — ABNORMAL HIGH (ref 11.5–15.5)
Smear Review: NORMAL
WBC Morphology: INCREASED
WBC: 4.3 10*3/uL (ref 4.0–10.5)
nRBC: 0 % (ref 0.0–0.2)

## 2021-06-06 LAB — CBC
HCT: 26 % — ABNORMAL LOW (ref 36.0–46.0)
Hemoglobin: 8.2 g/dL — ABNORMAL LOW (ref 12.0–15.0)
MCH: 34.7 pg — ABNORMAL HIGH (ref 26.0–34.0)
MCHC: 31.5 g/dL (ref 30.0–36.0)
MCV: 110.2 fL — ABNORMAL HIGH (ref 80.0–100.0)
Platelets: 163 10*3/uL (ref 150–400)
RBC: 2.36 MIL/uL — ABNORMAL LOW (ref 3.87–5.11)
RDW: 19.7 % — ABNORMAL HIGH (ref 11.5–15.5)
WBC: 3.9 10*3/uL — ABNORMAL LOW (ref 4.0–10.5)
nRBC: 0 % (ref 0.0–0.2)

## 2021-06-06 LAB — CREATININE, SERUM
Creatinine, Ser: 0.78 mg/dL (ref 0.44–1.00)
GFR, Estimated: >60 mL/min (ref >60)

## 2021-06-06 LAB — COMPREHENSIVE METABOLIC PANEL
ALT: 7 U/L (ref 0–44)
AST: 12 U/L — ABNORMAL LOW (ref 15–41)
Albumin: 2.4 g/dL — ABNORMAL LOW (ref 3.5–5.0)
Alkaline Phosphatase: 33 U/L — ABNORMAL LOW (ref 38–126)
Anion gap: 6 (ref 5–15)
BUN: 15 mg/dL (ref 8–23)
CO2: 27 mmol/L (ref 22–32)
Calcium: 8.3 mg/dL — ABNORMAL LOW (ref 8.9–10.3)
Chloride: 108 mmol/L (ref 98–111)
Creatinine, Ser: 0.77 mg/dL (ref 0.44–1.00)
GFR, Estimated: >60 mL/min (ref >60)
Glucose, Bld: 98 mg/dL (ref 70–99)
Potassium: 3.7 mmol/L (ref 3.5–5.1)
Sodium: 141 mmol/L (ref 135–145)
Total Bilirubin: 0.4 mg/dL (ref 0.3–1.2)
Total Protein: 5.6 g/dL — ABNORMAL LOW (ref 6.5–8.1)

## 2021-06-06 LAB — BODY FLUID CULTURE W GRAM STAIN: Culture: NO GROWTH

## 2021-06-06 LAB — BUN: BUN: 14 mg/dL (ref 8–23)

## 2021-06-06 LAB — HEPARIN LEVEL (UNFRACTIONATED)
Heparin Unfractionated: 0.34 IU/mL (ref 0.30–0.70)
Heparin Unfractionated: 0.45 IU/mL (ref 0.30–0.70)

## 2021-06-06 LAB — MAGNESIUM: Magnesium: 1.8 mg/dL (ref 1.7–2.4)

## 2021-06-06 LAB — PHOSPHORUS: Phosphorus: 3 mg/dL (ref 2.5–4.6)

## 2021-06-06 LAB — MRSA NEXT GEN BY PCR, NASAL: MRSA by PCR Next Gen: NOT DETECTED

## 2021-06-06 LAB — CYTOLOGY - NON PAP

## 2021-06-06 SURGERY — LOWER EXTREMITY ANGIOGRAPHY
Anesthesia: Moderate Sedation

## 2021-06-06 MED ORDER — SODIUM CHLORIDE 0.9 % IV SOLN: INTRAVENOUS | Status: DC

## 2021-06-06 MED ORDER — LIDOCAINE-EPINEPHRINE 2 %-1:100000 IJ SOLN
10.0000 mL | Freq: Once | INTRAMUSCULAR | Status: AC
  Administered 2021-06-06: 10 mL via INTRADERMAL
  Filled 2021-06-06: qty 20
  Filled 2021-06-06: qty 10

## 2021-06-06 MED ORDER — DIPHENHYDRAMINE HCL 50 MG/ML IJ SOLN: 50.0000 mg | Freq: Once | INTRAMUSCULAR | Status: DC | PRN

## 2021-06-06 MED ORDER — CLOPIDOGREL BISULFATE 75 MG PO TABS
ORAL_TABLET | ORAL | Status: AC
  Administered 2021-06-06: 75 mg via ORAL
  Filled 2021-06-06: qty 1

## 2021-06-06 MED ORDER — HEPARIN SODIUM (PORCINE) 1000 UNIT/ML IJ SOLN
INTRAMUSCULAR | Status: AC
  Filled 2021-06-06: qty 10

## 2021-06-06 MED ORDER — CLOPIDOGREL BISULFATE 75 MG PO TABS
75.0000 mg | ORAL_TABLET | Freq: Every day | ORAL | Status: DC
  Administered 2021-06-07 – 2021-06-14 (×8): 75 mg via ORAL
  Filled 2021-06-06 (×8): qty 1

## 2021-06-06 MED ORDER — IODIXANOL 320 MG/ML IV SOLN
INTRAVENOUS | Status: DC | PRN
  Administered 2021-06-06: 60 mL

## 2021-06-06 MED ORDER — DIPHENHYDRAMINE HCL 50 MG/ML IJ SOLN
12.5000 mg | Freq: Three times a day (TID) | INTRAMUSCULAR | Status: DC | PRN
  Administered 2021-06-06 – 2021-06-10 (×4): 12.5 mg via INTRAVENOUS
  Filled 2021-06-06 (×4): qty 1

## 2021-06-06 MED ORDER — CEFAZOLIN SODIUM-DEXTROSE 2-4 GM/100ML-% IV SOLN: 2.0000 g | Freq: Once | INTRAVENOUS | Status: AC

## 2021-06-06 MED ORDER — HEPARIN SODIUM (PORCINE) 1000 UNIT/ML IJ SOLN
INTRAMUSCULAR | Status: DC | PRN
  Administered 2021-06-06: 5000 [IU] via INTRAVENOUS

## 2021-06-06 MED ORDER — FAMOTIDINE 20 MG PO TABS: 40.0000 mg | ORAL_TABLET | Freq: Once | ORAL | Status: DC | PRN

## 2021-06-06 MED ORDER — CHLORHEXIDINE GLUCONATE CLOTH 2 % EX PADS
6.0000 | MEDICATED_PAD | Freq: Once | CUTANEOUS | Status: AC
  Administered 2021-06-07: 6 via TOPICAL

## 2021-06-06 MED ORDER — ONDANSETRON HCL 4 MG/2ML IJ SOLN: 4.0000 mg | Freq: Four times a day (QID) | INTRAMUSCULAR | Status: DC | PRN

## 2021-06-06 MED ORDER — CEFAZOLIN SODIUM-DEXTROSE 2-4 GM/100ML-% IV SOLN
INTRAVENOUS | Status: AC
  Administered 2021-06-06: 2 g via INTRAVENOUS
  Filled 2021-06-06: qty 100

## 2021-06-06 MED ORDER — HYDROMORPHONE HCL 1 MG/ML IJ SOLN: 1.0000 mg | Freq: Once | INTRAMUSCULAR | Status: DC | PRN

## 2021-06-06 MED ORDER — METHYLPREDNISOLONE SODIUM SUCC 125 MG IJ SOLR: 125.0000 mg | Freq: Once | INTRAMUSCULAR | Status: DC | PRN

## 2021-06-06 MED ORDER — MIDAZOLAM HCL 2 MG/2ML IJ SOLN
INTRAMUSCULAR | Status: AC
  Filled 2021-06-06: qty 4

## 2021-06-06 MED ORDER — FENTANYL CITRATE PF 50 MCG/ML IJ SOSY
PREFILLED_SYRINGE | INTRAMUSCULAR | Status: AC
  Filled 2021-06-06: qty 2

## 2021-06-06 MED ORDER — MIDAZOLAM HCL 2 MG/2ML IJ SOLN
INTRAMUSCULAR | Status: DC | PRN
  Administered 2021-06-06: 1 mg via INTRAVENOUS
  Administered 2021-06-06 (×2): .5 mg via INTRAVENOUS

## 2021-06-06 MED ORDER — MORPHINE SULFATE (PF) 2 MG/ML IV SOLN
2.0000 mg | INTRAVENOUS | Status: DC | PRN
  Administered 2021-06-06 – 2021-06-09 (×8): 2 mg via INTRAVENOUS
  Filled 2021-06-06 (×9): qty 1

## 2021-06-06 MED ORDER — MIDAZOLAM HCL 2 MG/ML PO SYRP: 8.0000 mg | ORAL_SOLUTION | Freq: Once | ORAL | Status: DC | PRN

## 2021-06-06 MED ORDER — HEPARIN (PORCINE) 25000 UT/250ML-% IV SOLN
1250.0000 [IU]/h | INTRAVENOUS | Status: DC
  Administered 2021-06-07 (×2): 1250 [IU]/h via INTRAVENOUS
  Filled 2021-06-06 (×2): qty 250

## 2021-06-06 MED ORDER — HEPARIN (PORCINE) 25000 UT/250ML-% IV SOLN
1250.0000 [IU]/h | INTRAVENOUS | Status: DC
Start: 2021-06-06 — End: 2021-06-06

## 2021-06-06 MED ORDER — FENTANYL CITRATE (PF) 100 MCG/2ML IJ SOLN
INTRAMUSCULAR | Status: DC | PRN
  Administered 2021-06-06 (×2): 25 ug via INTRAVENOUS

## 2021-06-06 MED ORDER — MEGESTROL ACETATE 400 MG/10ML PO SUSP
400.0000 mg | Freq: Every day | ORAL | Status: DC
  Administered 2021-06-08 – 2021-06-14 (×7): 400 mg via ORAL
  Filled 2021-06-06 (×9): qty 10

## 2021-06-06 SURGICAL SUPPLY — 21 items
BALLN LUTONIX 018 5X300X130 (BALLOONS) ×4
BALLN ULTRVRSE 3X300X150 (BALLOONS) ×2
BALLN ULTRVRSE 3X300X150 OTW (BALLOONS) ×1
BALLOON LUTONIX 018 5X300X130 (BALLOONS) IMPLANT
BALLOON ULTRVRSE 3X300X150 OTW (BALLOONS) IMPLANT
CATH ANGIO 5F PIGTAIL 65CM (CATHETERS) ×1 IMPLANT
CATH BEACON 5 .038 100 VERT TP (CATHETERS) ×1 IMPLANT
CATH NAVICROSS ANGLED 135CM (MICROCATHETER) ×1 IMPLANT
COVER PROBE U/S 5X48 (MISCELLANEOUS) ×1 IMPLANT
DEVICE STARCLOSE SE CLOSURE (Vascular Products) ×1 IMPLANT
DEVICE TORQUE (MISCELLANEOUS) ×1 IMPLANT
GLIDEWIRE ADV .035X260CM (WIRE) ×1 IMPLANT
KIT ENCORE 26 ADVANTAGE (KITS) ×1 IMPLANT
PACK ANGIOGRAPHY (CUSTOM PROCEDURE TRAY) ×2 IMPLANT
SHEATH ANL2 6FRX45 HC (SHEATH) ×1 IMPLANT
SHEATH BRITE TIP 5FRX11 (SHEATH) ×1 IMPLANT
STENT VIABAHN 6X250X120 (Permanent Stent) ×1 IMPLANT
SYR MEDRAD MARK 7 150ML (SYRINGE) ×1 IMPLANT
TUBING CONTRAST HIGH PRESS 72 (TUBING) ×1 IMPLANT
WIRE G V18X300CM (WIRE) ×1 IMPLANT
WIRE GUIDERIGHT .035X150 (WIRE) ×1 IMPLANT

## 2021-06-06 NOTE — Progress Notes (Signed)
?Progress Note ? ? ?Patient: Jenna Dudley:[id number] DOB: [birth year] DOA: 06/01/2021     4 ?DOS: the patient was seen and examined on 06/06/2021 ?  ?Brief hospital course: ?Jenna Dudley is a pleasant 86 y.o. female with medical history significant for chronic diastolic CHF, GERD, bipolar disorder, history of CVA, and right third toe wound, now presenting to the emergency department for evaluation of chest pain. ?In the hospital, she had heart rate of 114, resp irate 28, lactic acid 4.0.  CTA chest showed segmental PEs, right side opacity with a fluid level.  Also showed additional groundglass opacities, thickened esophagus wall.  And moderate to large right-sided pleural effusion.  Patient was given IV fluids, lactic acid level has dropped down to normal.  She is also started on antibiotics with Unasyn and vancomycin. ? ?Assessment and Plan: ?* Aspiration pneumonia ([place]) ?Patient is evaluated by speech therapy, currently on dysphagia 1 diet.  Continue Unasyn. ? ?Anorexia ?Patient has very poor appetite, has not been eating for the last 4 days since admission.  Discussed with patient husband, patient had periods of not eating, then bounced back eat a lot.  I will increase the dose of megestrol to 400 mg daily.  We will consider tube feeding if condition does not improve. ? 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 ?Continue oral iron. ?Hemoglobin is stable. ? ?Pleural effusion on right ?Status post thoracentesis with removing 1.2 L.  Lab study showed 79% neutrophils, Gram stain and cultures pending.  Glucose high, protein low.  LDH 61.  Appears to be transudate. ?Culture negative. ? ?Hypomagnesemia ?Improved. ? ?Necrotizing pneumonia ([place]) ?Appreciate consult from pulmonology.  Continue antibiotics. ?Blood culture ?1/4 bottle blood culture positive for Corynebacterium.  This is a contaminant. ?Continue  Unasyn. ? ?Chest pain ?Secondary to PE. ? ?Elevated troponin ?Peak troponin was 108, dropped down to 94.  This is secondary to sepsis. ? ?Pulmonary emboli ([place]) ?Continue heparin drip. ? ?Chronic diastolic CHF (congestive heart failure) ([place]) ?Acute congestive heart failure ruled out. ?Patient does not seem to have any volume overload.  Continue to follow. ? ?Hypokalemia ?Improved. ? ?Toe osteomyelitis, left ([place]) ?Patient has been evaluated by podiatry,  duplex ultrasound showed peripheral arterial disease, MRI showed third toe osteomyelitis.  Discussed with podiatry yesterday, obtain vascular surgery consult, pending decision about angiogram and amputation.   Continue vancomycin and Unasyn. ? ?Delirium with dementia ?Condition has improved. ? ?Bipolar 1 disorder ([place]) ?Resume home medicines. ? ?Severe sepsis ([place]) ?Patient met severe sepsis criteria with tachycardia and tachypnea.  Lactic acid level of 4.0.  This is secondary to pneumonia. ?Patient is clinically stable. ? ?History of stroke ?Continue to follow. ? ? ? ? ?  ? ?Subjective:  ?Patient has a baseline confusion, no agitation.  Spoke with the nurse, patient had a very little p.o. intake.  She states that she was not hungry. ?No nausea vomiting abdominal pain. ? ? ?Physical Exam: ?Vitals:  ? 06/05/21 1939 06/06/21 0454 06/06/21 0500 06/06/21 0947  ?BP: 135/73 (!) 116/52  140/68  ?Pulse: 83 77  82  ?Resp: _0 ?Temp: 98.5 ?F (36.9 ?C) 98 ?F (36.7 ?C)  98.2 ?F (36.8 ?C)  ?TempSrc:      ?SpO2: 92% 90%  96%  ?Weight:   47.5 kg   ?Height:      ? ?General exam: Appears calm and comfortable  ?Respiratory system:  Clear to auscultation. Respiratory effort normal. ?Cardiovascular system: S1 & S2 heard, RRR. No JVD, murmurs, rubs, gallops or clicks. No pedal edema. ?Gastrointestinal system: Abdomen is nondistended, soft and nontender. No organomegaly or masses felt. Normal bowel sounds heard. ?Central nervous system: Alert and oriented x2. No focal neurological  deficits. ?Extremities: Symmetric 5 x 5 power. ?Skin: No rashes, lesions or ulcers ? ? ?Data Reviewed: ?All labs reviewed ? ?Family Communication: Husband updated ? ?Disposition: ?Status is: Inpatient ?Remains inpatient appropriate because: Severity of disease and the pending procedure, IV antibiotics. ? Planned Discharge Destination: Home with Home Health ? ? ? ?Time spent: 35 minutes ? ?Author: ?Sharen Hones, MD ?06/06/2021 10:27 AM ? ?For on call review [email or web address].  ?

## 2021-06-06 NOTE — Progress Notes (Signed)
Pt arrived to the unit from 1A with moderate bleeding from left femoral groin post angioplasty earlier today. Dr. Delana Meyer was immediately notified about patient's condition. Pressure was held since admit to 2A, 40 ml removed from PAD, PAD removed and additional pressure held for more than 20 minutes. Dr. Delana Meyer at bedside to assess and treat. Primary nurse at bedside assisting as well. Continue to monitor patient who was requesting pain medication.  ? ?Bridgette Habermann DNP RN 2217 PM ?

## 2021-06-06 NOTE — Progress Notes (Signed)
ANTICOAGULATION CONSULT NOTE ? ?Pharmacy Consult for Heparin ?Indication: pulmonary embolus ? ?Patient Measurements: ?Height: 5' (152.4 cm) ?Weight: 49.9 kg (110 lb 0.2 oz) ?IBW/kg (Calculated) : 45.5 ?Heparin Dosing Weight: 47.6 kg ? ?Labs: ?Recent Labs  ?  06/03/21 ?0418 06/04/21 ?7209 06/04/21 ?1855 06/05/21 ?0436 06/05/21 ?1352 06/05/21 ?2214  ?HGB 8.8* 8.7*  --  8.4*  --   --   ?HCT 27.4* 27.2*  --  26.7*  --   --   ?PLT 220 243  --  199  --   --   ?HEPARINUNFRC  --   --    < > 0.23* 0.33 0.33  ?CREATININE 0.81 0.80  --  0.68  --   --   ? < > = values in this interval not displayed.  ? ? ? ?Estimated Creatinine Clearance: 38.3 mL/min (by C-G formula based on SCr of 0.68 mg/dL). ? ? ?Assessment: ?86 year old female admitted with CP found to be a result of segmental/subsegmental PEs in upper and lower lobes. Initial heparin gtt subtherapeutic, gtt running without complications. Hgb low in setting of Anemia of CKD. No active bleeding noted. H&H, platelets stable ? ?Podiatry following for right third toe osteomyelitis and PAD. Recommending vascular surgery consult. Anticipating toe amputation. ? ?Goal of Therapy:  ?Heparin level 0.3-0.7 units/ml ?Monitor platelets by anticoagulation protocol: Yes ?  ?Plan:  ?--Heparin level remains therapeutic  ?--Continue heparin infusion at 1250 units/hr ?--check heparin level once daily with am labs: next 06/07/21 ?--Daily CBC per protocol while on IV heparin ? ?Lowella Bandy  ?06/06/2021 ?4:08 AM ? ? ? ?

## 2021-06-06 NOTE — Progress Notes (Signed)
Pt returned from special procedural unit. Heparin gtt stopped for procedure. Plavix was given in procedural unit per RN. MD Chipper Herb and Wyn Quaker made aware, heparin gtt to be restarted in 4 hours at 1900. Patient to remain flat as possible until 1530.  ?

## 2021-06-06 NOTE — Progress Notes (Signed)
I was paged at 8:59 PM regarding bleeding from the left groin.  I was informed the patient has been just transferred to 2A via rapid response for bleeding that has been ongoing since 5 PM perhaps earlier.  Record shows that the procedure was completed at 2:13 PM.  This was my initial contact. ? ?Vital signs recorded show a blood pressure of 147/86 with a heart rate of 93 at 3:22 PM next set of vitals shows a blood pressure of 152/73 with a heart rate of 95 at 8:23 PM. ? ?At the time of the page I asked that the PAD be removed there is no protocol for removing a PAD under these circumstances it should be deflated and then removed and then direct pressure held.  This was performed in an excellent and expeditious fashion. ? ?Upon my arrival there is no bleeding, small incision consistent with access for an angiogram is noted.  The skin of the left groin is soft her groin is actually scaphoid her belly is soft and nondistended.  There is no ecchymoses there is no evidence for hematoma at all. ? ?I am uncertain of the earlier circumstances but I find no evidence that would support a groin complication or significant bleed.  Especially one that would warrant a rapid response and transfer of the patient. ? ?Interventions given the circumstances I will hold the heparin for 4 hours I have infiltrated 1% lidocaine with epinephrine into the soft tissues and the skin edge I applied Dermabond and then a pressure dressing. ? ?

## 2021-06-06 NOTE — H&P (View-Only) (Signed)
Subjective: ?86 year old female with past medical history significant for CHF, bipolar, history of CVA presented with concerns of chest pain.  Podiatry consulted for right foot ulcer, infection.  Osteomyelitis noted of the third toe.  Angio was preformed today. She has had some bleeding from the procedure site and is going to be transferred to PCU. Patient states she is having some pain to the foot. No fevers, chills.  ? ?Objective: ?NAD- laying in bed ?Necrotic changes present distal aspect the right third toe appears normal purulence noted along the interspace with wound, skin breakdown left fourth digit as well.  Mild erythema.  No fluctuance or crepitation.  There is tenderness palpation to the digit. ? ?Assessment: ?Right third toe osteomyelitis, PAD ? ?Plan: ?Duplex reviewed which revealed superficial femoral-popliteal arteries with monophasic waveforms with proximal disease as well as anterior tibial artery occluded and decreased waveforms in the posterior tibial artery. Underwent angioplasty today.  ? ?I called and discussed with the patients husband today. We discussed the infection and amputation versus long term antibiotics. He agrees to proceed with surgery. I discussed partial 3rd ray amputation, and possibly 4th.  We discussed alternatives, risks, complications.  No promises or guarantees given.  We discussed risks of surgery including spread of infection, delayed or nonhealing, further amputation, bleeding as well as general risks of surgery.  The patient's husband is understanding he wishes to proceed with surgery.  She is scheduled for surgery tomorrow with Dr. Amalia Hailey if stable. NPO after midnight.  ? ?Celesta Gentile, DPM ?

## 2021-06-06 NOTE — Op Note (Signed)
[facility] ? Percutaneous Study/Intervention Procedural Note ? ? ?Date of Surgery: 06/06/2021 ? ?Surgeon(s):Antonetta Clanton   ? ?Assistants:none ? ?Pre-operative Diagnosis: PAD with ulceration and infection right lower extremity ? ?Post-operative diagnosis:  Same ? ?Procedure(s) Performed: ?            1.  Ultrasound guidance for vascular access left femoral artery ?            2.  Catheter placement into right common femoral artery from left femoral approach ?            3.  Aortogram and selective right lower extremity angiogram ?            4.  Percutaneous transluminal angioplasty of right peroneal artery and tibioperoneal trunk with 3 mm diameter angioplasty balloon ?            5.  Percutaneous transluminal angioplasty of right distal SFA and popliteal arteries with 5 mm diameter by 30 cm length Lutonix drug-coated angioplasty balloon ? 6.  Viabahn stent placement to the distal right SFA and popliteal arteries with 6 mm diameter by 25 cm length stent for greater than 50% stenosis after angioplasty ?            7.  StarClose closure device left femoral artery ? ?EBL: 5 cc ? ?Contrast: 60 cc ? ?Fluoro Time: 10 minutes ? ?Moderate Conscious Sedation Time: approximately 47 minutes using 2 mg of Versed and 50 mcg of Fentanyl ?             ?Indications:  Patient is a 86 y.o.female with infected ulceration of the right foot and nonpalpable pedal pulses. The patient has noninvasive study showing reduced flow in the right leg. The patient is brought in for angiography for further evaluation and potential treatment.  Due to the limb threatening nature of the situation, angiogram was performed for attempted limb salvage. The patient is aware that if the procedure fails, amputation would be expected.  The patient also understands that even with successful revascularization, amputation may still be required due to the severity of the situation.  Risks and benefits are discussed and informed consent is  obtained.  ? ?Procedure:  The patient was identified and appropriate procedural time out was performed.  The patient was then placed supine on the table and prepped and draped in the usual sterile fashion. Moderate conscious sedation was administered during a face to face encounter with the patient throughout the procedure with my supervision of the RN administering medicines and monitoring the patient's vital signs, pulse oximetry, telemetry and mental status throughout from the start of the procedure until the patient was taken to the recovery room. Ultrasound was used to evaluate the left common femoral artery.  It was patent .  A digital ultrasound image was acquired.  A Seldinger needle was used to access the left common femoral artery under direct ultrasound guidance and a permanent image was performed.  A 0.035 J wire was advanced without resistance and a 5Fr sheath was placed.  Pigtail catheter was placed into the aorta and an AP aortogram was performed. This demonstrated Multiple renal arteries bilaterally without obvious stenosis.  The aorta and iliac arteries were highly calcific but not stenotic. I then crossed the aortic bifurcation and advanced to the right femoral head. Selective right lower extremity angiogram was then performed. This demonstrated fairly normal common femoral artery and profunda femoris artery.  The proximal to mid SFA were calcific but not  stenotic.  The mid to distal SFA had a short segment occlusion that reconstituted in the distal SFA but then there was subsequent occlusion at Hunter's canal down to below the knee.  Distal runoff was hard to opacify due to poor flow as well as her knee prosthesis, but she appeared to reconstitute the popliteal artery with a diseased tibioperoneal trunk and proximal peroneal artery with at least a moderate stenosis in the 60% range and then the peroneal artery became fairly large and was the only continuous runoff vessel distally. It was felt that  it was in the patient's best interest to proceed with intervention after these images to avoid a second procedure and a larger amount of contrast and fluoroscopy based off of the findings from the initial angiogram. The patient was systemically heparinized and a 6 [place] Ansell sheath was then placed over the [employer] wire. I then used a Kumpe catheter and the advantage wire to get down into the SFA and popliteal occlusions.  These were highly calcific and very tedious and difficult to cross.  Ultimately I had to exchanged for a Ferd Hibbs cross catheter and reentered in the tibioperoneal trunk.  I confirmed intraluminal catheter placement after advancing into the peroneal artery and exchanging for a V18 wire.  I then proceeded with treatment.  A 3 mm diameter by 30 cm length angioplasty balloon was used to angioplasty the peroneal artery and tibioperoneal trunk at the reentry site which was diseased and this was inflated to 6 atm for 1 minute.  3 mm balloon was then used to predilate the SFA and popliteal lesions.  I then upsized to a 5 mm diameter by 30 cm length Lutonix drug-coated angioplasty balloon and treated the entire popliteal artery and the distal SFA inflating this to 8 atm for 1 minute.  Completion imaging showed extensive residual disease with greater than 50% throughout the distal SFA and popliteal arteries and a 6 mm diameter by 25 cm length Viabahn stent was deployed in the distal SFA and popliteal arteries down to the mid to distal popliteal artery.  This was postdilated with 5 mm balloon.  Completion imaging showed less than 20% residual stenosis in the SFA, popliteal artery, tibioperoneal trunk, and proximal peroneal arteries that were treated. I elected to terminate the procedure. The sheath was removed and StarClose closure device was deployed in the left femoral artery with excellent hemostatic result. The patient was taken to the recovery room in stable condition having tolerated the  procedure well. ? ?Findings:   ?            Aortogram: Multiple renal arteries bilaterally without obvious stenosis.  The aorta and iliac arteries were highly calcific but not stenotic. ?            Right lower Extremity:  This demonstrated fairly normal common femoral artery and profunda femoris artery.  The proximal to mid SFA were calcific but not stenotic.  The mid to distal SFA had a short segment occlusion that reconstituted in the distal SFA but then there was subsequent occlusion at Hunter's canal down to below the knee.  Distal runoff was hard to opacify due to poor flow as well as her knee prosthesis, but she appeared to reconstitute the popliteal artery with a diseased tibioperoneal trunk and proximal peroneal artery with at least a moderate stenosis in the 60% range and then the peroneal artery became fairly large and was the only continuous runoff vessel distally. ? ? ?Disposition: Patient was  taken to the recovery room in stable condition having tolerated the procedure well. ? ?Complications: None ? ?Leotis Pain ?06/06/2021 ?2:14 PM ? ? ?This note was created with Dragon Medical transcription system. Any errors in dictation are purely unintentional.  ?

## 2021-06-06 NOTE — Progress Notes (Signed)
PT Cancellation Note ? ?Patient Details ?Name: Jenna Dudley ?MRN: [id number] ?DOB: Feb 22, 1936 ? ? ?Cancelled Treatment:    Reason Eval/Treat Not Completed: Patient at procedure or test/unavailable. Pt off floor for LE angiography. Will re-attempt at later time/date as medically appropriate.  ? ? ?Delphia Grates. Fairly IV, PT, DPT ?Physical Therapist- [place]  ?[facility]  ?06/06/2021, 12:54 PM ?

## 2021-06-06 NOTE — Progress Notes (Signed)
Called by patient's primary RN to come and assess groin site after procedure d/t increase in bleeding. On my arrival pt is lying in bed asleep. Left groin has PAD in place and is intact. Noticeable amount of oozing from site. Dr. Wyn Quaker has been notified. Spoke with primary RN and AC at this time and pt will be transferred to PCU to monitor site more closely. ?

## 2021-06-06 NOTE — Care Management Important Message (Signed)
Important Message ? ?Patient Details  ?Name: Jenna Dudley ?MRN: [id number] ?Date of Birth: March 18, 1936 ? ? ?Medicare Important Message Given:  Yes ? ? ? ? ?Olegario Messier A [place] Wahler ?06/06/2021, 11:30 AM ?

## 2021-06-06 NOTE — Progress Notes (Signed)
ICU Rounder notified. At bedside. Small increase in bleeding at L groin site. Pressure dressing intact. ?

## 2021-06-06 NOTE — Progress Notes (Signed)
Subjective: ?85-year-old female with past medical history significant for CHF, bipolar, history of CVA presented with concerns of chest pain.  Podiatry consulted for right foot ulcer, infection.  Osteomyelitis noted of the third toe.  Angio was preformed today. She has had some bleeding from the procedure site and is going to be transferred to PCU. Patient states she is having some pain to the foot. No fevers, chills.  ? ?Objective: ?NAD- laying in bed ?Necrotic changes present distal aspect the right third toe appears normal purulence noted along the interspace with wound, skin breakdown left fourth digit as well.  Mild erythema.  No fluctuance or crepitation.  There is tenderness palpation to the digit. ? ?Assessment: ?Right third toe osteomyelitis, PAD ? ?Plan: ?Duplex reviewed which revealed superficial femoral-popliteal arteries with monophasic waveforms with proximal disease as well as anterior tibial artery occluded and decreased waveforms in the posterior tibial artery. Underwent angioplasty today.  ? ?I called and discussed with the patients husband today. We discussed the infection and amputation versus long term antibiotics. He agrees to proceed with surgery. I discussed partial 3rd ray amputation, and possibly 4th.  We discussed alternatives, risks, complications.  No promises or guarantees given.  We discussed risks of surgery including spread of infection, delayed or nonhealing, further amputation, bleeding as well as general risks of surgery.  The patient's husband is understanding he wishes to proceed with surgery.  She is scheduled for surgery tomorrow with Dr. Evans if stable. NPO after midnight.  ? ?Dravin Lance, DPM ?

## 2021-06-06 NOTE — Progress Notes (Signed)
L groin site having small amount of bleeding, Dr. Wyn Quaker notified. ?

## 2021-06-06 NOTE — Progress Notes (Signed)
ANTICOAGULATION CONSULT NOTE ? ?Pharmacy Consult for Heparin ?Indication: pulmonary embolus ? ?Patient Measurements: ?Height: 5' (152.4 cm) ?Weight: 47.5 kg (104 lb 11.5 oz) ?IBW/kg (Calculated) : 45.5 ?Heparin Dosing Weight: 47.6 kg ? ?Labs: ?Recent Labs  ?  06/05/21 ?0436 06/05/21 ?1352 06/05/21 ?2214 06/06/21 ?0302 06/06/21 ?0370 06/06/21 ?1604  ?HGB 8.4*  --   --  8.2* 8.2*  --   ?HCT 26.7*  --   --  25.8* 26.0*  --   ?PLT 199  --   --  157 163  --   ?HEPARINUNFRC 0.23*   < > 0.33 0.34 0.45  --   ?CREATININE 0.68  --   --  0.77  --  0.78  ? < > = values in this interval not displayed.  ? ? ? ?Estimated Creatinine Clearance: 38.3 mL/min (by C-G formula based on SCr of 0.78 mg/dL). ? ? ?Assessment: ?86 year old female admitted with CP found to be a result of segmental/subsegmental PEs in upper and lower lobes. Initial heparin gtt subtherapeutic, gtt running without complications. Hgb low in setting of Anemia of CKD. No active bleeding noted. H&H, platelets stable ? ?Podiatry following for right third toe osteomyelitis and PAD. Recommending vascular surgery consult. Anticipating toe amputation. ? ?Goal of Therapy:  ?Heparin level 0.3-0.7 units/ml ?Monitor platelets by anticoagulation protocol: Yes ?  ?Plan:  ?3/13 PM-AM Addendum: Plan to hold heparin gtt starting at 2130 on 3/13-3/14 at 0400 then resume at prior rate w/o initial bolus. Okay to dose by nomogram thereafter. ? ?--Heparin level remains therapeutic  ?--Continue heparin infusion at 1250 units/hr ?--check heparin level once daily with am labs: next 06/07/21 ?--Daily CBC per protocol while on IV heparin ? ?Sherlynn Carbon Eustace Hur  ?06/06/2021 ?9:50 PM ? ? ? ?

## 2021-06-07 ENCOUNTER — Inpatient Hospital Stay: Payer: Medicare Other

## 2021-06-07 ENCOUNTER — Inpatient Hospital Stay: Payer: Medicare Other | Admitting: Anesthesiology

## 2021-06-07 ENCOUNTER — Encounter
Admission: EM | Disposition: A | Discharge status: Skilled Nursing Facility | Payer: Self-pay | Source: Home / Self Care | Attending: Internal Medicine

## 2021-06-07 ENCOUNTER — Encounter: Payer: Self-pay | Admitting: Vascular Surgery

## 2021-06-07 ENCOUNTER — Other Ambulatory Visit: Payer: Self-pay

## 2021-06-07 DIAGNOSIS — I998 Other disorder of circulatory system: Secondary | ICD-10-CM | POA: Diagnosis not present

## 2021-06-07 DIAGNOSIS — J690 Pneumonitis due to inhalation of food and vomit: Secondary | ICD-10-CM | POA: Diagnosis not present

## 2021-06-07 DIAGNOSIS — R630 Anorexia: Secondary | ICD-10-CM | POA: Diagnosis not present

## 2021-06-07 DIAGNOSIS — E43 Unspecified severe protein-calorie malnutrition: Secondary | ICD-10-CM | POA: Insufficient documentation

## 2021-06-07 DIAGNOSIS — I70261 Atherosclerosis of native arteries of extremities with gangrene, right leg: Secondary | ICD-10-CM

## 2021-06-07 DIAGNOSIS — I2699 Other pulmonary embolism without acute cor pulmonale: Secondary | ICD-10-CM | POA: Diagnosis not present

## 2021-06-07 DIAGNOSIS — M86671 Other chronic osteomyelitis, right ankle and foot: Secondary | ICD-10-CM

## 2021-06-07 HISTORY — PX: AMPUTATION TOE: SHX6595

## 2021-06-07 LAB — CULTURE, BLOOD (ROUTINE X 2): Culture: NO GROWTH

## 2021-06-07 LAB — HEPARIN LEVEL (UNFRACTIONATED): Heparin Unfractionated: 0.34 IU/mL (ref 0.30–0.70)

## 2021-06-07 LAB — CBC
HCT: 28.1 % — ABNORMAL LOW (ref 36.0–46.0)
Hemoglobin: 8.7 g/dL — ABNORMAL LOW (ref 12.0–15.0)
MCH: 34.7 pg — ABNORMAL HIGH (ref 26.0–34.0)
MCHC: 31 g/dL (ref 30.0–36.0)
MCV: 112 fL — ABNORMAL HIGH (ref 80.0–100.0)
Platelets: 142 10*3/uL — ABNORMAL LOW (ref 150–400)
RBC: 2.51 MIL/uL — ABNORMAL LOW (ref 3.87–5.11)
RDW: 19.5 % — ABNORMAL HIGH (ref 11.5–15.5)
WBC: 9.8 10*3/uL (ref 4.0–10.5)
nRBC: 0 % (ref 0.0–0.2)

## 2021-06-07 SURGERY — AMPUTATION, TOE
Anesthesia: General | Site: Toe | Laterality: Right

## 2021-06-07 MED ORDER — PROPOFOL 500 MG/50ML IV EMUL
INTRAVENOUS | Status: AC
  Filled 2021-06-07: qty 50

## 2021-06-07 MED ORDER — ALBUTEROL SULFATE (2.5 MG/3ML) 0.083% IN NEBU: 2.5000 mg | INHALATION_SOLUTION | Freq: Once | RESPIRATORY_TRACT | Status: AC

## 2021-06-07 MED ORDER — PHENYLEPHRINE HCL (PRESSORS) 10 MG/ML IV SOLN
INTRAVENOUS | Status: AC
  Filled 2021-06-07: qty 1

## 2021-06-07 MED ORDER — ALBUTEROL SULFATE (2.5 MG/3ML) 0.083% IN NEBU
INHALATION_SOLUTION | RESPIRATORY_TRACT | Status: AC
  Administered 2021-06-07: 2.5 mg via RESPIRATORY_TRACT
  Filled 2021-06-07: qty 3

## 2021-06-07 MED ORDER — LIDOCAINE HCL (PF) 2 % IJ SOLN
INTRAMUSCULAR | Status: AC
  Filled 2021-06-07: qty 5

## 2021-06-07 MED ORDER — SODIUM CHLORIDE 0.9 % IV SOLN
3.0000 g | Freq: Four times a day (QID) | INTRAVENOUS | Status: DC
  Administered 2021-06-07 – 2021-06-10 (×13): 3 g via INTRAVENOUS
  Filled 2021-06-07 (×2): qty 8
  Filled 2021-06-07 (×3): qty 3
  Filled 2021-06-07: qty 8
  Filled 2021-06-07: qty 3
  Filled 2021-06-07: qty 8
  Filled 2021-06-07: qty 3
  Filled 2021-06-07 (×4): qty 8
  Filled 2021-06-07: qty 3
  Filled 2021-06-07: qty 8

## 2021-06-07 MED ORDER — ASCORBIC ACID 500 MG PO TABS
500.0000 mg | ORAL_TABLET | Freq: Two times a day (BID) | ORAL | Status: DC
  Administered 2021-06-08 – 2021-06-14 (×13): 500 mg via ORAL
  Filled 2021-06-07 (×13): qty 1

## 2021-06-07 MED ORDER — PROPOFOL 10 MG/ML IV BOLUS
INTRAVENOUS | Status: AC
  Filled 2021-06-07: qty 20

## 2021-06-07 MED ORDER — MIDAZOLAM HCL 2 MG/2ML IJ SOLN
INTRAMUSCULAR | Status: DC | PRN
  Administered 2021-06-07 (×2): 1 mg via INTRAVENOUS

## 2021-06-07 MED ORDER — ONDANSETRON HCL 4 MG/2ML IJ SOLN: 4.0000 mg | Freq: Once | INTRAMUSCULAR | Status: DC | PRN

## 2021-06-07 MED ORDER — BUPIVACAINE HCL 0.5 % IJ SOLN
INTRAMUSCULAR | Status: DC | PRN
  Administered 2021-06-07: 30 mL

## 2021-06-07 MED ORDER — ACETAMINOPHEN 10 MG/ML IV SOLN
INTRAVENOUS | Status: AC
  Administered 2021-06-07: 10 mg
  Filled 2021-06-07: qty 100

## 2021-06-07 MED ORDER — BUPIVACAINE HCL (PF) 0.5 % IJ SOLN
INTRAMUSCULAR | Status: AC
  Filled 2021-06-07: qty 30

## 2021-06-07 MED ORDER — METOPROLOL SUCCINATE ER 50 MG PO TB24
50.0000 mg | ORAL_TABLET | Freq: Every day | ORAL | Status: DC
  Administered 2021-06-07 – 2021-06-08 (×2): 50 mg via ORAL
  Filled 2021-06-07: qty 2
  Filled 2021-06-07 (×2): qty 1

## 2021-06-07 MED ORDER — ZINC SULFATE 220 (50 ZN) MG PO CAPS
220.0000 mg | ORAL_CAPSULE | Freq: Every day | ORAL | Status: DC
  Administered 2021-06-08 – 2021-06-14 (×7): 220 mg via ORAL
  Filled 2021-06-07 (×7): qty 1

## 2021-06-07 MED ORDER — LIDOCAINE HCL (CARDIAC) PF 100 MG/5ML IV SOSY
PREFILLED_SYRINGE | INTRAVENOUS | Status: DC | PRN
  Administered 2021-06-07: 20 mg via INTRAVENOUS

## 2021-06-07 MED ORDER — FENTANYL CITRATE (PF) 100 MCG/2ML IJ SOLN: 25.0000 ug | INTRAMUSCULAR | Status: DC | PRN

## 2021-06-07 MED ORDER — FENTANYL CITRATE (PF) 100 MCG/2ML IJ SOLN
INTRAMUSCULAR | Status: AC
  Filled 2021-06-07: qty 2

## 2021-06-07 MED ORDER — 0.9 % SODIUM CHLORIDE (POUR BTL) OPTIME
TOPICAL | Status: DC | PRN
  Administered 2021-06-07: 1000 mL

## 2021-06-07 MED ORDER — PROSOURCE PLUS PO LIQD
30.0000 mL | Freq: Three times a day (TID) | ORAL | Status: DC
  Administered 2021-06-08 – 2021-06-14 (×17): 30 mL via ORAL
  Filled 2021-06-07 (×21): qty 30

## 2021-06-07 MED ORDER — PROPOFOL 500 MG/50ML IV EMUL
INTRAVENOUS | Status: DC | PRN
  Administered 2021-06-07 (×2): 20 mg via INTRAVENOUS

## 2021-06-07 MED ORDER — MIDAZOLAM HCL 2 MG/2ML IJ SOLN
INTRAMUSCULAR | Status: AC
  Filled 2021-06-07: qty 2

## 2021-06-07 MED ORDER — HEPARIN (PORCINE) 25000 UT/250ML-% IV SOLN: 1250.0000 [IU]/h | INTRAVENOUS | Status: DC

## 2021-06-07 SURGICAL SUPPLY — 49 items
BLADE MED AGGRESSIVE (BLADE) ×2 IMPLANT
BLADE OSC/SAGITTAL MD 5.5X18 (BLADE) IMPLANT
BLADE SURG 15 STRL LF DISP TIS (BLADE) IMPLANT
BLADE SURG 15 STRL SS (BLADE)
BLADE SURG MINI STRL (BLADE) IMPLANT
BNDG CONFORM 2 STRL LF (GAUZE/BANDAGES/DRESSINGS) IMPLANT
BNDG ELASTIC 4X5.8 VLCR STR LF (GAUZE/BANDAGES/DRESSINGS) ×2 IMPLANT
BNDG ESMARK 4X12 TAN STRL LF (GAUZE/BANDAGES/DRESSINGS) ×2 IMPLANT
BNDG GAUZE ELAST 4 BULKY (GAUZE/BANDAGES/DRESSINGS) ×2 IMPLANT
CNTNR SPEC 2.5X3XGRAD LEK (MISCELLANEOUS) ×1
CONT SPEC 4OZ STER OR WHT (MISCELLANEOUS) ×1
CONT SPEC 4OZ STRL OR WHT (MISCELLANEOUS) ×1
CONTAINER SPEC 2.5X3XGRAD LEK (MISCELLANEOUS) ×1 IMPLANT
CUFF TOURN SGL QUICK 12 (TOURNIQUET CUFF) IMPLANT
CUFF TOURN SGL QUICK 18X4 (TOURNIQUET CUFF) IMPLANT
DRAPE FLUOR MINI C-ARM 54X84 (DRAPES) IMPLANT
DRSG EMULSION OIL 3X3 NADH (GAUZE/BANDAGES/DRESSINGS) ×1 IMPLANT
DURAPREP 26ML APPLICATOR (WOUND CARE) ×2 IMPLANT
ELECT REM PT RETURN 9FT ADLT (ELECTROSURGICAL) ×2
ELECTRODE REM PT RTRN 9FT ADLT (ELECTROSURGICAL) ×1 IMPLANT
GAUZE SPONGE 4X4 12PLY STRL (GAUZE/BANDAGES/DRESSINGS) ×4 IMPLANT
GAUZE XEROFORM 1X8 LF (GAUZE/BANDAGES/DRESSINGS) ×2 IMPLANT
GLOVE SRG 8 PF TXTR STRL LF DI (GLOVE) ×1 IMPLANT
GLOVE SURG ENC MOIS LTX SZ8 (GLOVE) ×2 IMPLANT
GLOVE SURG UNDER POLY LF SZ8 (GLOVE) ×2
GOWN STRL REUS W/ TWL LRG LVL3 (GOWN DISPOSABLE) ×2 IMPLANT
GOWN STRL REUS W/TWL LRG LVL3 (GOWN DISPOSABLE) ×4
HANDPIECE VERSAJET DEBRIDEMENT (MISCELLANEOUS) IMPLANT
IV NS IRRIG 3000ML ARTHROMATIC (IV SOLUTION) IMPLANT
KIT TURNOVER KIT A (KITS) ×2 IMPLANT
LABEL OR SOLS (LABEL) ×2 IMPLANT
MANIFOLD NEPTUNE II (INSTRUMENTS) ×2 IMPLANT
NDL FILTER BLUNT 18X1 1/2 (NEEDLE) ×1 IMPLANT
NDL HYPO 25X1 1.5 SAFETY (NEEDLE) ×2 IMPLANT
NEEDLE FILTER BLUNT 18X 1/2SAF (NEEDLE) ×1
NEEDLE FILTER BLUNT 18X1 1/2 (NEEDLE) ×1 IMPLANT
NEEDLE HYPO 25X1 1.5 SAFETY (NEEDLE) ×4 IMPLANT
NS IRRIG 500ML POUR BTL (IV SOLUTION) ×2 IMPLANT
PACK EXTREMITY ARMC (MISCELLANEOUS) ×2 IMPLANT
SOL PREP PVP 2OZ (MISCELLANEOUS) ×2
SOLUTION PREP PVP 2OZ (MISCELLANEOUS) ×1 IMPLANT
STOCKINETTE STRL 6IN 960660 (GAUZE/BANDAGES/DRESSINGS) ×2 IMPLANT
SUT ETHILON 3-0 FS-10 30 BLK (SUTURE) ×4
SUT VIC AB 3-0 SH 27 (SUTURE) ×2
SUT VIC AB 3-0 SH 27X BRD (SUTURE) ×1 IMPLANT
SUTURE EHLN 3-0 FS-10 30 BLK (SUTURE) ×2 IMPLANT
SWAB CULTURE AMIES ANAERIB BLU (MISCELLANEOUS) IMPLANT
SYR 10ML LL (SYRINGE) ×2 IMPLANT
WATER STERILE IRR 500ML POUR (IV SOLUTION) ×2 IMPLANT

## 2021-06-07 NOTE — Progress Notes (Signed)
On arrival to my shift, patient still had a diet order. I released the held surgery orders, making her NPO. Breakfast tray was brought to the room, and patient drank the orange juice on the tray before it was removed. She also had 2 bites of applesauce for her necessary medications this morning. Patient now NPO.  ?

## 2021-06-07 NOTE — Brief Op Note (Signed)
06/07/2021 ? ?7:27 PM ? ?PATIENT:  Jenna Dudley  86 y.o. female ? ?PRE-OPERATIVE DIAGNOSIS:  Right 3rd and 4th toe infection ? ?POST-OPERATIVE DIAGNOSIS:  Right third toe osteomyelitis, PAD ? ?PROCEDURE:  Procedure(s): ?AMPUTATION TOE (Right) ? ?SURGEON:  Surgeon(s) and Role: ?   Felecia Shelling, DPM - Primary ? ?PHYSICIAN ASSISTANT:  ? ?ASSISTANTS: none  ? ?ANESTHESIA:   local and IV sedation ? ?EBL:  1 mL  ? ?BLOOD ADMINISTERED:none ? ?DRAINS: none  ? ?LOCAL MEDICATIONS USED:  MARCAINE    ? ?SPECIMEN:  Source of Specimen:  Third Metatarsal RT  ? ?DISPOSITION OF SPECIMEN:  PATHOLOGY ? ?COUNTS:  YES ? ?TOURNIQUET:  * Missing tourniquet times found for documented tourniquets in log: [id number] * ? ?DICTATION: .Dragon Dictation ? ?PLAN OF CARE: Admit to inpatient  ? ?PATIENT DISPOSITION:  PACU - hemodynamically stable. ?  ?Delay start of Pharmacological VTE agent (>24hrs) due to surgical blood loss or risk of bleeding: not applicable ? ?

## 2021-06-07 NOTE — Progress Notes (Signed)
? ? ? ?PULMONOLOGY ? ? ? ? ? ? ? ? ?Date: 06/07/2021,   ?MRN# [id number] Jenna Dudley Aug 13, 1935 ? ? ?  ?AdmissionWeight: 51.6 kg                 ?CurrentWeight: 50.2 kg ? ? ?Referring physician: Dr Chipper HerbZhang ? ? ?CHIEF COMPLAINT:  ? ?Abnormal CT chest imaging with pleural effusion status postthoracentesis ? ? ?HISTORY OF PRESENT ILLNESS  ? ?This is a pleasant 86 year old female with a history of esophageal spasms and GERD, chronic depression, heart failure with preserved ejection fraction, bipolar disorder, right foot wounds, initially came to the emergency department with chest pain.  She reports chest pain is only been for 1 day.  She was found to have mild hypoxemia with SPO2 in the 90s on room air blood work showed anemia which is chronic and macrocytic as well as mild hypokalemia with a potassium of 3.1 she did have lactic acidosis with mild troponin elevation. She had CTPE done with bilateral PE noted. There is additional consolidated area of right lung which appears to be infiltrate consistent with pneumonia. She has not smoked in >40 years and does not have a history of cancer personally not family history of cancer. Fluid profile is PMN predominant thus far with additional studies pending. PCCM consultation for further evaluation and management.  ? ?06/03/21- patient is improved able to speak on phone with family. She is being optimized for dc home. ? ?06/04/21- patient is on room air does not reports SOB. She reports pain in RUQ and this is tender to mild palpation. CBD dilation noted on CT abd this admission. Will obtain biliary profile/with LFTs.  ? ?06/05/21- patient is stable from pulmonary perspective. She is hypoalbuminemic due to moderate protein calorie malnutrition.  ? ?06/07/21- patient in bed in no distress.  Mildly rhonchorous but nonlabred breathing. Reports foot pain but improved from previous.  PCCM will sign off at this point due to patient only requiring 2L/min [place] and she is on heparin for small  PE ? ? ?PAST MEDICAL HISTORY  ? ?Past Medical History:  ?Diagnosis Date  ? Bilateral carpal tunnel syndrome 07/27/2014  ? Bipolar disorder (HCC)   ? [facility] 2018  ? Chronic diastolic CHF (congestive heart failure) (HCC)   ? a. 03/2017 Echo: EF 55-60%, no rwma, Gr1 DD, mild MR; b. 06/2019 Echo: EF 55-60%, no rwma, mod LVH, GrII DD. Nl RV size/fxn. RVSP 54.406mmHg. Mildly dil LA.  ? Chronic low back pain 07/29/2014  ? Degenerative arthritis of lumbar spine 07/27/2014  ? Dementia (HCC)   ? Depression   ? Esophageal spasm 07/29/2014  ? Gastric reflux 09/17/2014  ? GERD (gastroesophageal reflux disease)   ? History of stress test   ? a. 04/2017 MV: low risk stress test.  EF 55-65%. Probable apical ant/apical, basal, and mid inflat attenuation artifact vs ischemia/scar.  ? Hyperlipemia 07/29/2014  ? Hypertension   ? Pneumonia   ? Spinal stenosis 07/29/2014  ? ? ? ?SURGICAL HISTORY  ? ?Past Surgical History:  ?Procedure Laterality Date  ? HIP SURGERY Right   ? INNER EAR SURGERY Right   ? LOWER EXTREMITY ANGIOGRAPHY N/A 06/06/2021  ? Procedure: Lower Extremity Angiography;  Surgeon: Annice Needyew, Jason S, MD;  Location: [facility] INVASIVE CV LAB;  Service: Cardiovascular;  Laterality: N/A;  ? REPLACEMENT TOTAL KNEE BILATERAL Bilateral 07/27/14  ? ? ? ?FAMILY HISTORY  ? ?Family History  ?Problem Relation Age of Onset  ?  Hypertension Mother   ? Stroke Mother   ? ? ? ?SOCIAL HISTORY  ? ?Social History  ? ?Tobacco Use  ? Smoking status: Former  ?  Types: Cigarettes  ?  Quit date: 07/29/1975  ?  Years since quitting: 45.8  ? Smokeless tobacco: Never  ?Vaping Use  ? Vaping Use: Never used  ?Substance Use Topics  ? Alcohol use: No  ?  Alcohol/week: 0.0 standard drinks  ? Drug use: No  ? ? ? ?MEDICATIONS  ? ? ?Home Medication:  ?  ?Current Medication: ? ?Current Facility-Administered Medications:  ?  [START ON 06/08/2021] (feeding supplement) PROSource Plus liquid 30 mL, 30 mL, Oral, TID BM, Marrion Coy, MD ?  0.9 %   sodium chloride infusion, , Intravenous, PRN, Wyn Quaker, Marlow Baars, MD, Paused at 06/04/21 1324 ?  acetaminophen (TYLENOL) tablet 650 mg, 650 mg, Oral, Q6H PRN, 650 mg at 06/07/21 0656 **OR** acetaminophen (TYLENOL) suppository 650 mg, 650 mg, Rectal, Q6H PRN, Wyn Quaker, Marlow Baars, MD ?  albuterol (PROVENTIL) (2.5 MG/3ML) 0.083% nebulizer solution 2.5 mg, 2.5 mg, Nebulization, Q6H PRN, Wyn Quaker, Marlow Baars, MD ?  Ampicillin-Sulbactam (UNASYN) 3 g in sodium chloride 0.9 % 100 mL IVPB, 3 g, Intravenous, Q6H, Marrion Coy, MD, Last Rate: 200 mL/hr at 06/07/21 1527, 3 g at 06/07/21 1527 ?  [START ON 06/08/2021] ascorbic acid (VITAMIN C) tablet 500 mg, 500 mg, Oral, BID, Marrion Coy, MD ?  clopidogrel (PLAVIX) tablet 75 mg, 75 mg, Oral, Daily, Dew, Marlow Baars, MD, 75 mg at 06/07/21 7322 ?  diphenhydrAMINE (BENADRYL) injection 12.5 mg, 12.5 mg, Intravenous, Q8H PRN, Schnier, Latina Craver, MD, 12.5 mg at 06/06/21 2235 ?  feeding supplement (BOOST / RESOURCE BREEZE) liquid 1 Container, 1 Container, Oral, TID BM, Annice Needy, MD, 1 Container at 06/06/21 2223 ?  [COMPLETED] heparin bolus via infusion 3,000 Units, 3,000 Units, Intravenous, Once, 3,000 Units at 06/04/21 1322 **FOLLOWED BY** heparin ADULT infusion 100 units/mL (25000 units/243mL), 1,250 Units/hr, Intravenous, Continuous, Beers, Sherlynn Carbon, RPH, Stopped at 06/07/21 1123 ?  lactated ringers 1,000 mL with potassium chloride 20 mEq infusion, , Intravenous, Continuous, Dew, Marlow Baars, MD, Stopped at 06/07/21 [phone]  MEDLINE mouth rinse, 15 mL, Mouth Rinse, BID, Dew, Marlow Baars, MD, 15 mL at 06/06/21 2223 ?  megestrol (MEGACE) 400 MG/10ML suspension 400 mg, 400 mg, Oral, Daily, Dew, Marlow Baars, MD ?  metoprolol succinate (TOPROL-XL) 24 hr tablet 50 mg, 50 mg, Oral, Daily, Marrion Coy, MD ?  morphine (PF) 2 MG/ML injection 2 mg, 2 mg, Intravenous, Q4H PRN, Schnier, Latina Craver, MD, 2 mg at 06/07/21 1521 ?  multivitamin with minerals tablet 1 tablet, 1 tablet, Oral, Daily, Annice Needy, MD, 1 tablet at  06/05/21 1014 ?  ondansetron (ZOFRAN) tablet 4 mg, 4 mg, Oral, Q6H PRN, 4 mg at 06/05/21 1412 **OR** ondansetron (ZOFRAN) injection 4 mg, 4 mg, Intravenous, Q6H PRN, Annice Needy, MD, 4 mg at 06/04/21 2148 ?  ondansetron (ZOFRAN) injection 4 mg, 4 mg, Intravenous, Q6H PRN, Dew, Marlow Baars, MD ?  pantoprazole (PROTONIX) EC tablet 40 mg, 40 mg, Oral, Daily, Dew, Marlow Baars, MD, 40 mg at 06/05/21 1014 ?  polyethylene glycol (MIRALAX / GLYCOLAX) packet 17 g, 17 g, Oral, Daily PRN, Wyn Quaker, Marlow Baars, MD ?  QUEtiapine (SEROQUEL) tablet 25 mg, 25 mg, Oral, QHS, Dew, Marlow Baars, MD, 25 mg at 06/06/21 2224 ?  simvastatin (ZOCOR) tablet 40 mg, 40 mg, Oral, QHS, Dew, Marlow Baars, MD, 40 mg  at 06/06/21 2224 ?  sodium chloride flush (NS) 0.9 % injection 3 mL, 3 mL, Intravenous, Q12H, Dew, Marlow Baars, MD, 3 mL at 06/07/21 4825 ?  sucralfate (CARAFATE) tablet 1 g, 1 g, Oral, TID WC & HS, Dew, Marlow Baars, MD, 1 g at 06/06/21 2223 ?  vancomycin (VANCOCIN) IVPB 1000 mg/200 mL premix, 1,000 mg, Intravenous, Q36H, Annice Needy, MD, Last Rate: 200 mL/hr at 06/06/21 1103, 1,000 mg at 06/06/21 1103 ?  [START ON 06/08/2021] zinc sulfate capsule 220 mg, 220 mg, Oral, Daily, Marrion Coy, MD ? ?Facility-Administered Medications Ordered in Other Encounters:  ?  0.9 %  sodium chloride infusion, , , Continuous PRN, Lily Kocher, CRNA, Continued from Pre-op at 08/07/18 1110 ? ? ? ?ALLERGIES  ? ?Aspirin and Sulfa antibiotics ? ? ? ? ?REVIEW OF SYSTEMS  ? ? ?Review of Systems: ? ?Gen:  Denies  fever, sweats, chills weigh loss  ?HEENT: Denies blurred vision, double vision, ear pain, eye pain, hearing loss, nose bleeds, sore throat ?Cardiac:  No dizziness, chest pain or heaviness, chest tightness,edema ?Resp:   Denies cough or sputum porduction, shortness of breath,wheezing, hemoptysis,  ?Gi: Denies swallowing difficulty, stomach pain, nausea or vomiting, diarrhea, constipation, bowel incontinence ?Gu:  Denies bladder incontinence, burning urine ?Ext:   Denies Joint  pain, stiffness or swelling ?Skin: Denies  skin rash, easy bruising or bleeding or hives ?Endoc:  Denies polyuria, polydipsia , polyphagia or weight change ?Psych:   Denies depression, insomnia or hallucinations  ?

## 2021-06-07 NOTE — Anesthesia Postprocedure Evaluation (Signed)
Anesthesia Post Note ? ?Patient: Jenna Dudley ? ?Procedure(s) Performed: AMPUTATION TOE (Right: Toe) ? ?Patient location during evaluation: PACU ?Anesthesia Type: General ?Level of consciousness: awake and alert and confused ?Pain management: pain level controlled ?Vital Signs Assessment: post-procedure vital signs reviewed and stable ?Respiratory status: spontaneous breathing, nonlabored ventilation, respiratory function stable and patient connected to nasal cannula oxygen ?Cardiovascular status: blood pressure returned to baseline and stable ?Postop Assessment: no apparent nausea or vomiting ?Anesthetic complications: no ? ? ?No notable events documented. ? ? ?Last Vitals:  ?Vitals:  ? 06/07/21 1930 06/07/21 2011  ?BP: (!) 145/75 135/71  ?Pulse: 95 96  ?Resp: (!) 22 18  ?Temp: 36.4 ?C 36.7 ?C  ?SpO2: 100% 100%  ?  ?Last Pain:  ?Vitals:  ? 06/07/21 1930  ?TempSrc:   ?PainSc: 3   ? ? ?  ?  ?  ?  ?  ?  ? ?Foye Deer ? ? ? ? ?

## 2021-06-07 NOTE — Progress Notes (Signed)
ANTICOAGULATION CONSULT NOTE ? ?Pharmacy Consult for Heparin ?Indication: pulmonary embolus ? ?Patient Measurements: ?Height: 5' (152.4 cm) ?Weight: 50.2 kg (110 lb 10.7 oz) ?IBW/kg (Calculated) : 45.5 ?Heparin Dosing Weight: 47.6 kg ? ?Labs: ?Recent Labs  ?  06/05/21 ?0436 06/05/21 ?1352 06/06/21 ?0302 06/06/21 ?0619 06/06/21 ?1604 06/07/21 ?1116  ?HGB 8.4*  --  8.2* 8.2*  --  8.7*  ?HCT 26.7*  --  25.8* 26.0*  --  28.1*  ?PLT 199  --  157 163  --  142*  ?HEPARINUNFRC 0.23*   < > 0.34 0.45  --  0.34  ?CREATININE 0.68  --  0.77  --  0.78  --   ? < > = values in this interval not displayed.  ? ? ? ?Estimated Creatinine Clearance: 38.3 mL/min (by C-G formula based on SCr of 0.78 mg/dL). ? ? ?Assessment: ?86 year old female admitted with CP found to be a result of segmental/subsegmental PEs in upper and lower lobes. Initial heparin gtt subtherapeutic, gtt running without complications. Hgb low in setting of Anemia of CKD. No active bleeding noted. H&H, platelets stable ? ?Podiatry following for right third toe osteomyelitis and PAD. Recommending vascular surgery consult. Anticipating toe amputation. ? ?Date/Time HL Rate/Comment ?3/11   <0.10 1,000 units/hr; subtherapeutic ?3/12 0528 0.23 1150 units/hr; subtherapeutic ?3/12 1352 0.33 1250 units/hr; therapeutic    3/ ?3/12 2214 0.33 1250 units/hr; therapeutic ?3/13 heparin gtt held @2130  ?3/14 heparin restarted @0400   ?3/14 heparin held @1129  ? ?Surgery scheduled for 1700- continuing to hold heparin ? ?Goal of Therapy:  ?Heparin level 0.3-0.7 units/ml ?Monitor platelets by anticoagulation protocol: Yes ?  ?Plan: Per d/w Dr. (Podiatry) plan to hold heparin for 6hrs post-operatively, then resume at midnight 3/15. ?Will follow up restart heparin after surgery. No initial bolus; then bolus per nomogram thereafter. ?Planning to resume heparin at previously therapeutic rate of 1250 un/hr.  ?CTM heparin level every 8hours until consecutively therapeutic, then daily  thereafter. ?CTM daily CBC ? ? , PharmD, BCCP ?Clinical Pharmacist ?06/07/2021 9:08 PM ? ? ? ? ? ?

## 2021-06-07 NOTE — Interval H&P Note (Signed)
History and Physical Interval Note: ? ?06/07/2021 ?12:01 PM ? ?[name]  has presented today for surgery, with the diagnosis of Right 3rd and 4th toe infection.  The various methods of treatment have been discussed with the patient and family. After consideration of risks, benefits and other options for treatment, the patient has consented to  Procedure(s): ?AMPUTATION TOE (Right) as a surgical intervention.  The patient's history has been reviewed, patient examined, no change in status, stable for surgery.  I have reviewed the patient's chart and labs.  Questions were answered to the patient's satisfaction.   ? ? ?Jenna Dudley ? ? ?

## 2021-06-07 NOTE — Progress Notes (Addendum)
Nutrition Follow-up ? ?DOCUMENTATION CODES:  ? ?Severe malnutrition in context of chronic illness ? ?INTERVENTION:  ? ?-Continue Boost Breeze po TID, each supplement provides 250 kcal and 9 grams of protein  ?-30 ml Prosource Plus TID, each supplement provides 100 kcals and 15 grams protein ?-Hormel Shake TID with meals, each supplement provides 520 kcals and 22 grams protein ?-MVI with minerals daily ?-500 mg vitamin C BID ?-220 mg zinc sulfate daily x 14 days ?-Feeding assistance with meals ? ?NUTRITION DIAGNOSIS:  ? ?Severe Malnutrition related to chronic illness (CHF, CVA) as evidenced by moderate fat depletion, severe fat depletion, moderate muscle depletion, severe muscle depletion. ? ?Ongoing ? ?GOAL:  ? ?Patient will meet greater than or equal to 90% of their needs ? ?Progressing  ? ?MONITOR:  ? ?PO intake, Supplement acceptance, Diet advancement, Labs, Weight trends, Skin, I & O's ? ?REASON FOR ASSESSMENT:  ? ?Rounds ?  ? ?ASSESSMENT:  ? ?Jenna Dudley is a pleasant 86 y.o. female with medical history significant for chronic diastolic CHF, GERD, bipolar disorder, history of CVA, and right third toe wound, now presenting to the emergency department for evaluation of chest pain. ? ?3/9- s/p rt thoracentesis (1.2 L removed) ?3/10- s/p BSE- dysphagia 1 diet with thin liquids ?3/13- s/p aortogram ? ?Reviewed I/O's: +1 L x 24 hours and +5.3 L since admission ? ?Per podiatry notes, plan for partial 3rd ray amputation (possibly 4th) today. Pt currently NPO for procedure.  ? ?Spoke with pt at bedside, who was very tearful at time of visit. Pt able to state that she is in the hospital, however, reports "I don't have a home to go to". RD attempted to re-orient pt to place and situation and provide comfort; reminded pt of her family members who have visited. Pt continued to cry and say "Can I come with you to your house?".  ? ?Pt with very poor oral intake. Noted meal completions 0-5%. Pt also minimally accepting of  supplements.  ? ?Reviewed wt hx; pt has experienced a 5% wt loss over the past 6 months, which is not significant for time frame. ?  ?Medications reviewed and include megace, remeron, effexor, and lactated ringers with KCl @ 50 ml/hr.  ? ?Labs reviewed: CBGS: 109.  ? ?NUTRITION - FOCUSED PHYSICAL EXAM: ? ?Flowsheet Row Most Recent Value  ?Orbital Region Moderate depletion  ?Upper Arm Region Severe depletion  ?Thoracic and Lumbar Region Severe depletion  ?Buccal Region Moderate depletion  ?Temple Region Severe depletion  ?Clavicle Bone Region Severe depletion  ?Clavicle and Acromion Bone Region Severe depletion  ?Scapular Bone Region Severe depletion  ?Dorsal Hand Moderate depletion  ?Patellar Region Moderate depletion  ?Anterior Thigh Region Moderate depletion  ?Posterior Calf Region Moderate depletion  ?Edema (RD Assessment) None  ?Hair Reviewed  ?Eyes Reviewed  ?Mouth Reviewed  ?Skin Reviewed  ?Nails Reviewed  ? ?  ? ? ?Diet Order:   ?Diet Order   ? ?       ?  Diet NPO time specified  Diet effective midnight       ?  ?  DIET - DYS 1 Room service appropriate? Yes; Fluid consistency: Thin  Diet effective now       ?  ? ?  ?  ? ?  ? ? ?EDUCATION NEEDS:  ? ?Not appropriate for education at this time ? ?Skin:  Skin Assessment: Skin Integrity Issues: ?Skin Integrity Issues:: Other (Comment) ?Other: non-pressure wound to rt anterior toe ? ?Last BM:  06/06/21 ? ?Height:  ? ?Ht Readings from Last 1 Encounters:  ?06/01/21 5' (1.524 m)  ? ? ?Weight:  ? ?Wt Readings from Last 1 Encounters:  ?06/07/21 50.2 kg  ? ? ?Ideal Body Weight:  45.5 kg ? ?BMI:  Body mass index is 21.61 kg/m?. ? ?Estimated Nutritional Needs:  ? ?Kcal:  1450-1650 ? ?Protein:  60-75 grams ? ?Fluid:  > 1.4 L ? ? ? ?Levada Schilling, RD, LDN, CDCES ?Registered Dietitian II ?Certified Diabetes Care and Education Specialist ?Please refer to [facility] for RD and/or RD on-call/weekend/after hours pager  ?

## 2021-06-07 NOTE — Progress Notes (Signed)
ANTICOAGULATION CONSULT NOTE ? ?Pharmacy Consult for Heparin ?Indication: pulmonary embolus ? ?Patient Measurements: ?Height: 5' (152.4 cm) ?Weight: 50.2 kg (110 lb 10.7 oz) ?IBW/kg (Calculated) : 45.5 ?Heparin Dosing Weight: 47.6 kg ? ?Labs: ?Recent Labs  ?  06/05/21 ?0436 06/05/21 ?1352 06/06/21 ?0302 06/06/21 ?0619 06/06/21 ?1604 06/07/21 ?1116  ?HGB 8.4*  --  8.2* 8.2*  --  8.7*  ?HCT 26.7*  --  25.8* 26.0*  --  28.1*  ?PLT 199  --  157 163  --  142*  ?HEPARINUNFRC 0.23*   < > 0.34 0.45  --  0.34  ?CREATININE 0.68  --  0.77  --  0.78  --   ? < > = values in this interval not displayed.  ? ? ? ?Estimated Creatinine Clearance: 38.3 mL/min (by C-G formula based on SCr of 0.78 mg/dL). ? ? ?Assessment: ?86 year old female admitted with CP found to be a result of segmental/subsegmental PEs in upper and lower lobes. Initial heparin gtt subtherapeutic, gtt running without complications. Hgb low in setting of Anemia of CKD. No active bleeding noted. H&H, platelets stable ? ?Podiatry following for right third toe osteomyelitis and PAD. Recommending vascular surgery consult. Anticipating toe amputation. ? ?Date/Time HL Rate/Comment ?3/11   <0.10 1,000 units/hr; subtherapeutic ?3/12 0528 0.23 1150 units/hr; subtherapeutic ?3/12 1352 0.33 1250 units/hr; therapeutic    3/ ?3/12 2214 0.33 1250 units/hr; therapeutic ?3/13 heparin gtt held @2130  ?3/14 heparin restarted @0400   ?3/14 heparin held @1129  ? ?Surgery scheduled for 1700- continuing to hold heparin ? ?Goal of Therapy:  ?Heparin level 0.3-0.7 units/ml ?Monitor platelets by anticoagulation protocol: Yes ?  ?Plan: Continue to hold heparin per medical team. ?Follow up restart heparin after surgery ? ? ? , PharmD ?Pharmacy Resident  ?06/07/2021 ?1:14 PM ? ? ? ? ?

## 2021-06-07 NOTE — Progress Notes (Signed)
?Progress Note ? ? ?Patient: Jenna Dudley PNT:[id number] DOB: 1935/11/17 DOA: 06/01/2021     5 ?DOS: the patient was seen and examined on 06/07/2021 ?  ?Brief hospital course: ?Jenna Dudley is a pleasant 86 y.o. female with medical history significant for chronic diastolic CHF, GERD, bipolar disorder, history of CVA, and right third toe wound, now presenting to the emergency department for evaluation of chest pain. ?In the hospital, she had heart rate of 114, resp irate 28, lactic acid 4.0.  CTA chest showed segmental PEs, right side opacity with a fluid level.  Also showed additional groundglass opacities, thickened esophagus wall.  And moderate to large right-sided pleural effusion.  Patient was given IV fluids, lactic acid level has dropped down to normal.  She is also started on antibiotics with Unasyn and vancomycin. ?Patient also has a left third toe osteomyelitis peripheral vascular disease.  Angioplasty and stent placement was performed on 3/13. ?Toe amputation scheduled 3/14. ? ? ?Assessment and Plan: ?* Aspiration pneumonia ([place]) ?Patient is evaluated by speech therapy, currently on dysphagia 1 diet.  Still on Unasyn for osteomyelitis. ? ?Protein-calorie malnutrition, severe ?Patient has not been eating, she appears severely malnourished.  Discussed with husband, will try megestrol for few days.  May consider tube feeding if condition does not improve. ? ?Anorexia ?Patient has very poor appetite, has not been eating for the last 5 days since admission except a few bites.  Discussed with patient husband, patient had periods of not eating, then bounced back eat a lot.  ?Started megestrol, only getting it since yesterday.  Patient has been mostly n.p.o. for the last 2 days for procedures.  We will continue to monitor p.o. intake, if does not improve, may consider tube feeding. ? 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 ?Continue oral iron. ?Hemoglobin is stable. ? ?Pleural effusion on right ?Status post thoracentesis with removing 1.2 L.  Lab study showed 79% neutrophils, Gram stain and cultures pending.  Glucose high, protein low.  LDH 61.  Appears to be transudate. ?Culture negative. ? ?Hypomagnesemia ?Improved. ? ?Necrotizing pneumonia ([place]) ?Appreciate consult from pulmonology.  Continue antibiotics. ?Blood culture ?1/4 bottle blood culture positive for Corynebacterium.  This is a contaminant. ?Continue Unasyn. ? ?Chest pain ?Secondary to PE. ? ?Elevated troponin ?Peak troponin was 108, dropped down to 94.  This is secondary to sepsis. ? ?Pulmonary emboli ([place]) ?On heparin drip, will switch to Eliquis tomorrow after amputation. ? ?Chronic diastolic CHF (congestive heart failure) ([place]) ?Acute congestive heart failure ruled out. ?Patient does not seem to have any volume overload.  Continue to follow. ? ?Hypokalemia ?Improved. ? ?Toe osteomyelitis, left ([place]) ?Patient has been evaluated by podiatry,  duplex ultrasound showed peripheral arterial disease, MRI showed third toe osteomyelitis.  Patient has had vascular intervention with angioplasty, stent placement on 3/13.  Scheduled for toe amputation today. ?Continue vancomycin. ? ?Delirium with dementia ?Condition has improved. ? ?Bipolar 1 disorder ([place]) ?Resume home medicines. ? ?Severe sepsis ([place]) ?Patient met severe sepsis criteria with tachycardia and tachypnea.  Lactic acid level of 4.0.  This is secondary to pneumonia and toe osteomyelitis. ?Patient is clinically stable. ? ?History of stroke ?Continue to follow. ? ? ? ? ?  ? ?Subjective:  ?Patient has mostly n.p.o. for the last 2 days due to procedures.  Overall, she has a very poor appetite.  But no nausea vomiting or abdominal pain. ?No  short of breath. ? ?Physical Exam: ?Vitals:  ? 06/07/21 0252 06/07/21 0345 06/07/21 0907 06/07/21 1134  ?BP:  (!) 151/75 (!) 152/76 134/75  ?Pulse:  93 98 100  ?Resp:  16 (!) 21  18  ?Temp:  97.7 ?F (36.5 ?C) 97.6 ?F (36.4 ?C) 97.8 ?F (36.6 ?C)  ?TempSrc:    Tympanic  ?SpO2:  100% 100% 100%  ?Weight: 50.2 kg   50.2 kg  ?Height:    5' (1.524 m)  ? ?General exam: Appears calm and comfortable, severely malnourished. ?Respiratory system: Clear to auscultation. Respiratory effort normal. ?Cardiovascular system: Irregular. No JVD, murmurs, rubs, gallops or clicks. No pedal edema. ?Gastrointestinal system: Abdomen is nondistended, soft and nontender. No organomegaly or masses felt. Normal bowel sounds heard. ?Central nervous system: Alert and oriented x2. No focal neurological deficits. ?Extremities: Muscle atrophy. ?Skin: No rashes, lesions or ulcers ? ?Data Reviewed: ?Reviewed all lab results. ?Family Communication:  ? ?Disposition: ?Status is: Inpatient ?Remains inpatient appropriate because: Severity of disease, IV treatment. ? Planned Discharge Destination: Skilled nursing facility ? ? ? ?Time spent: 32 minutes ? ?Author: ?Sharen Hones, MD ?06/07/2021 12:51 PM ? ?For on call review [email or web address].  ?

## 2021-06-07 NOTE — Progress Notes (Signed)
Occupational Therapy Treatment ?Patient Details ?Name: Jenna Dudley ?MRN: [id number] ?DOB: 02-18-1936 ?Today's Date: 06/07/2021 ? ? ?History of present illness Patient is an 86 year old female with a PMH (+) for chronic diastolic CHF, GERD, bipolar disorder, history of CVA, and right third toe wound. Patient presented to [facility] ED on 06/01/21 for chest pain, and was admitted for Aspiration pneumonia. ?  ?OT comments ? Pt in bed upon OT arrival, agitated and reporting wanting to be left alone.  OT provided education on benefits of repositioning to reduce risk of skin break down, and encouraged ADLs EOB.  OT provided encouragement and tactile cues to help transition pt to EOB.  Pt adamantly declined and moved her feet back to middle of bed.  Pt agreeable only to repositioning in bed.  Pt rolled from R to L with mod A and assisted minimally with scoot toward HOB.  OT wedged pillow under buttocks to keep pt in L side lying and placed pillow under feet to float heels.  Pt left with all needs met, bed alarm set, tray and phone within reach.  Will continue to attempt ADLs and functional mobility in the acute setting in order to work towards return to PLOF.  Continue to recommend [facility] at d/c.   ? ?Recommendations for follow up therapy are one component of a multi-disciplinary discharge planning process, led by the attending physician.  Recommendations may be updated based on patient status, additional functional criteria and insurance authorization. ?   ?Follow Up Recommendations ? Home health OT  ?  ?Assistance Recommended at Discharge Frequent or constant Supervision/Assistance  ?Patient can return home with the following ? A little help with walking and/or transfers;A little help with bathing/dressing/bathroom;Assistance with cooking/housework;Direct supervision/assist for financial management;Assist for transportation;Direct supervision/assist for medications management ?  ?Equipment Recommendations ? None recommended by OT   ?  ?Recommendations for Other Services   ? ?  ?Precautions / Restrictions Precautions ?Precautions: Fall ?Restrictions ?Weight Bearing Restrictions: No  ? ? ?  ? ?Mobility Bed Mobility ?Overal bed mobility: Needs Assistance ?  ?Rolling: Mod assist ?  ?  ?  ?  ?General bed mobility comments: pt resisted OT's attempts at transitioning feet toward EOB.  OT scooted feet toward EOB and pt scooted right back. ?Patient Response: Anxious ? ?Transfers ?  ?  ?  ?  ?  ?  ?  ?  ?  ?General transfer comment: pt declined; OT was agreeable to positional change in bed and assisted minimally with scoot toward [facility]; OT then initiated a weight shift to prompt rolling to the L; pt initially resisted then rolled with mod A. ?  ?  ?Balance   ?  ?  ?Sitting balance - Comments: unable to assess this day as pt declined all attempts to transition to EOB ?  ?  ?  ?  ?  ?  ?  ?  ?  ?  ?  ?  ?  ?  ?  ?   ? ?ADL either performed or assessed with clinical judgement  ? ?ADL Overall ADL's : Needs assistance/impaired ?  ?  ?  ?  ?  ?  ?  ?  ?  ?  ?  ?  ?  ?  ?  ?  ?  ?  ?  ?General ADL Comments: pt declined all attempts at ADLs, and resisted transition to EOB ?  ? ?Extremity/Trunk Assessment Upper Extremity Assessment ?Upper Extremity Assessment: Generalized weakness ?  ?  Lower Extremity Assessment ?Lower Extremity Assessment: Generalized weakness ?  ?  ?  ? ?Vision Patient Visual Report: No change from baseline ?  ?  ?   ?  ?   ?  ? ?Cognition Arousal/Alertness: Lethargic ?Behavior During Therapy: Agitated ?Overall Cognitive Status: Impaired/Different from baseline ?Area of Impairment: Orientation, Attention, Following commands, Safety/judgement, Awareness, Problem solving ?  ?  ?  ?  ?  ?  ?  ?  ?Orientation Level: Disoriented to, Time, Situation ?Current Attention Level: Focused ?  ?Following Commands: Follows one step commands inconsistently ?  ?Awareness: Intellectual ?Problem Solving: Decreased initiation, Requires verbal cues, Requires tactile  cues ?General Comments: Pt stated she wanted to be left alone ?  ?  ?   ?   ? ?  ?   ? ? ?  ?General Comments pt remained resistaive to all intervention  ? ? ?Pertinent Vitals/ Pain       Pain Assessment ?Pain Assessment: Faces ?Faces Pain Scale: Hurts a little bit ?Breathing: normal ?Negative Vocalization: occasional moan/groan, low speech, negative/disapproving quality ?Facial Expression: sad, frightened, frown ?Body Language: tense, distressed pacing, fidgeting ?Consolability: no need to console ?PAINAD Score: 3 ?Pain Location: Pt just reports she wants to be left alone and would not verbalize location of discomfort ?Pain Descriptors / Indicators: Guarding ?Pain Intervention(s): Repositioned ? ?Home Living   ?  ?  ?  ?  ?  ?  ?  ?  ?  ?  ?  ?  ?  ?  ?  ?  ?  ?  ? ?  ?Prior Functioning/Environment    ?  ?  ?  ?   ? ?Frequency ? Min 2X/week  ? ? ? ? ?  ?Progress Toward Goals ? ?OT Goals(current goals can now be found in the care plan section) ? Progress towards OT goals: Not progressing toward goals - comment (pt resistive to intervention) ? ?Acute Rehab OT Goals ?Patient Stated Goal: to be left alone ?OT Goal Formulation: With patient ?Time For Goal Achievement: 06/19/21 ?Potential to Achieve Goals: Good  ?Plan     ? ?Co-evaluation ? ? ?   ?  ?  ?  ?  ? ?  ?AM-PAC OT "6 Clicks" Daily Activity     ?Outcome Measure ? ? Help from another person eating meals?: A Lot ?Help from another person taking care of personal grooming?: A Lot ?Help from another person toileting, which includes using toliet, bedpan, or urinal?: A Lot ?Help from another person bathing (including washing, rinsing, drying)?: A Lot ?Help from another person to put on and taking off regular upper body clothing?: A Little ?Help from another person to put on and taking off regular lower body clothing?: A Lot ?6 Click Score: 13 ? ?  ?End of Session   ? ?OT Visit Diagnosis: Muscle weakness (generalized) (M62.81) ?  ?Activity Tolerance Treatment limited  secondary to agitation ?  ?Patient Left in bed;with call bell/phone within reach;with bed alarm set;with nursing/sitter in room ?  ?Nurse Communication Mobility status ?  ? ?   ? ?Time: 7408-1448 ?OT Time Calculation (min): 21 min ? ?Charges: OT General Charges ?$OT Visit: 1 Visit ?OT Treatments ?$Self Care/Home Management : 8-22 mins ? ?Leta Speller, MS, OTR/L ? ? ?Darleene Cleaver ?06/07/2021, 11:16 AM ?

## 2021-06-07 NOTE — Transfer of Care (Addendum)
Immediate Anesthesia Transfer of Care Note ? ?Patient: Jenna Dudley ? ?Procedure(s) Performed: AMPUTATION TOE (Right: Toe) ? ?Patient Location: PACU ? ?Anesthesia Type:MAC ? ?Level of Consciousness: oriented ? ?Airway & Oxygen Therapy: Patient Spontanous Breathing and Patient connected to nasal cannula oxygen ? ?Post-op Assessment: Report given to RN and Post -op Vital signs reviewed and stable ? ?Post vital signs: Reviewed and stable ? ?Last Vitals:  ?Vitals Value Taken Time  ?BP 142/67 06/07/21 1848  ?Temp    ?Pulse 98 06/07/21 1852  ?Resp 29 06/07/21 1852  ?SpO2 100 % 06/07/21 1852  ?Vitals shown include unvalidated device data. ? ?Last Pain:  ?Vitals:  ? 06/07/21 1628  ?TempSrc: Oral  ?PainSc:   ?   ? ?Patients Stated Pain Goal: 0 (06/07/21 1134) ? ?Complications: No notable events documented. ?

## 2021-06-07 NOTE — Progress Notes (Signed)
Pharmacy Antibiotic Note ? ?Jenna Dudley is a 86 y.o. female admitted on 06/01/2021 with aspiration pneumonia & osteomyelitis. Pharmacy has been consulted for vancomycin and ampicillin/sulbactam. ? ? ?Plan: ?Continue vancomycin for 7 days with end date 3/14 (today). No levels needed unless vancomycin is restarted.  ? ?Continue Unasyn 3 gm q8h x 10 days. End date placed. ? ?Follow up transition to Augmentin. ? ?Height: 5' (152.4 cm) ?Weight: 50.2 kg (110 lb 10.7 oz) ?IBW/kg (Calculated) : 45.5 ? ?Temp (24hrs), Avg:98.1 ?F (36.7 ?C), Min:97.7 ?F (36.5 ?C), Max:98.5 ?F (36.9 ?C) ? ?Recent Labs  ?Lab 06/01/21 ?2357 06/02/21 ?0257 06/02/21 ?1308 06/03/21 ?6578 06/04/21 ?4696 06/05/21 ?2952 06/06/21 ?0302 06/06/21 ?8413 06/06/21 ?1604  ?WBC 11.1*  --    < > 7.1 5.6 4.0 4.3 3.9*  --   ?CREATININE 0.88  --    < > 0.81 0.80 0.68 0.77  --  0.78  ?LATICACIDVEN 4.0* 1.9  --   --   --   --   --   --   --   ? < > = values in this interval not displayed.  ? ?  ?Estimated Creatinine Clearance: 38.3 mL/min (by C-G formula based on SCr of 0.78 mg/dL).   ? ?Allergies  ?Allergen Reactions  ? Aspirin Other (See Comments)  ?  Unknown reaction  ? Sulfa Antibiotics Other (See Comments)  ?  Unknown reaction  ? ? ?Antimicrobials this admission: ?3/9 Azithromycin >> x 1 dose ?3/9 Ceftriaxone >> x 1 dose ?3/9 Unasyn >> x 10 days ?3/9 Vancomycin >> x 7 days ? ?Microbiology results: ?3/8 BCx: NG x 5 d ?3/8 UCx: NG x 3 d ?4/9: pleural: few WBC, NG x 3 d ?3/13 MRSA PCR: not detected ? ?Thank you for allowing pharmacy to be a part of this patient?s care. ? ?Jaynie Bream, PharmD ?Pharmacy Resident  ?06/07/2021 ?9:04 AM ? ? ? ?

## 2021-06-07 NOTE — Progress Notes (Signed)
PT Cancellation Note ? ?Patient Details ?Name: JUNEAU CONTOIS ?MRN: [id number] ?DOB: 05/31/1935 ? ? ?Cancelled Treatment:    Reason Eval/Treat Not Completed: Patient at procedure or test/unavailable. Pt off floor for toe amputation. Will re-attempt at later time/date as available. ? ?Salem Caster. Fairly IV, PT, DPT ?Physical Therapist- [place]  ?[facility]  ?06/07/2021, 12:18 PM ?

## 2021-06-07 NOTE — Anesthesia Preprocedure Evaluation (Addendum)
Anesthesia Evaluation  ?Patient identified by MRN, date of birth, ID band ?Patient awake ? ?General Assessment Comment: ? ?Presented to hospital with chest pain, found to have evidence of pneumonia, sepsis, pleural effusion. S/p 1.2L thoracentesis. On minimal O2. Chest pain attributed to pneumonia and sepsis. ?Last drank orange juice at 11am, will proceed with case at 1pm ? ?Reviewed: ?Allergy & Precautions, NPO status , Patient's Chart, lab work & pertinent test results ? ?History of Anesthesia Complications ?Negative for: history of anesthetic complications ? ?Airway ?Mallampati: II ? ?TM Distance: >3 FB ?Neck ROM: Full ? ? ? Dental ? ?(+) Edentulous Upper, Poor Dentition, Missing ?  ?Pulmonary ?neg sleep apnea, neg COPD, former smoker,  ?Pulmonary Embolisms on heparin drip with severe pulmonary hypertension and supplemental oxygen ?  ?- rhonchi ?+ decreased breath sounds(-) wheezing ? ? ? ? ? Cardiovascular ?Exercise Tolerance: Poor ?hypertension, Pt. on medications ?pulmonary hypertension+CHF  ?(-) CAD, (-) Past MI, (-) Cardiac Stents and (-) CABG + Valvular Problems/Murmurs MR  ?Rhythm:Regular Rate:Tachycardia ?- Systolic murmurs and - Diastolic murmurs ?1. Left ventricular ejection fraction, by estimation, is 55 to 60%. The  ?left ventricle has normal function. The left ventricle has no regional  ?wall motion abnormalities. There is moderate left ventricular hypertrophy.  ?Left ventricular diastolic  ?parameters are indeterminate.  ??2. Right ventricular systolic function is normal. The right ventricular  ?size is normal. There is severely elevated pulmonary artery systolic  ?pressure. The estimated right ventricular systolic pressure is 85.3 mmHg.  ??3. Left atrial size was mildly dilated.  ??4. The mitral valve is normal in structure. Moderate to severe mitral  ?valve regurgitation. No evidence of mitral stenosis.  ??5. Tricuspid valve regurgitation is moderate to severe.   ??6. The aortic valve was not well visualized. Aortic valve regurgitation  ?is not visualized. No aortic stenosis is present.  ??7. The inferior vena cava is normal in size with <50% respiratory  ?variability, suggesting right atrial pressure of 8 mmHg.  ?  ?Neuro/Psych ?PSYCHIATRIC DISORDERS Anxiety Depression Bipolar Disorder Dementia negative neurological ROS ?   ? GI/Hepatic ?Neg liver ROS, GERD  ,  ?Endo/Other  ?negative endocrine ROSneg diabetes ? Renal/GU ?negative Renal ROS  ? ?  ?Musculoskeletal ? ?(+) Arthritis ,  ? Abdominal ?(+) - obese,   ?Peds ? Hematology ? ?(+) Blood dyscrasia, anemia , thrombocytopenia   ?Anesthesia Other Findings ?Past Medical History: ?07/27/14: Bilateral carpal tunnel syndrome ?No date: Bipolar disorder (HCC) ?    Comment:  geropsych admission to [facility] in Dec 2018 ?No date: Chronic diastolic CHF (congestive heart failure) (HCC) ?    Comment:  a. 03/2017 Echo: EF 55-60%, no rwma, Gr1 DD, mild MR. ?07/29/14: Chronic low back pain ?07/27/14: Degenerative arthritis of lumbar spine ?No date: Dementia [facility]) ?No date: Depression ?07/29/14: Esophageal spasm ?No date: GERD (gastroesophageal reflux disease) ?07/29/14: Hyperlipemia ?No date: Hypertension ?No date: Pneumonia ?07/29/14: Spinal stenosis ? ? Reproductive/Obstetrics ? ?  ? ? ? ? ? ? ? ? ? ? ? ? ? ?  ?  ? ? ? ? ? ?Anesthesia Physical ? ?Anesthesia Plan ? ?ASA: 4 ? ?Anesthesia Plan: General  ? ?Post-op Pain Management: Minimal or no pain anticipated and Regional block*  ? ?Induction: Intravenous ? ?PONV Risk Score and Plan: 3 and Ondansetron, Treatment may vary due to age or medical condition, Propofol infusion and TIVA ? ?Airway Management Planned: Natural Airway ? ?Additional Equipment: None ? ?Intra-op Plan:  ? ?Post-operative Plan:  ? ?Informed Consent: I have  reviewed the patients History and Physical, chart, labs and discussed the procedure including the risks, benefits and alternatives for the proposed anesthesia with the patient  or authorized representative who has indicated his/her understanding and acceptance.  ? ? ? ?Dental advisory given ? ?Plan Discussed with: CRNA and Anesthesiologist ? ?Anesthesia Plan Comments: (Discussed risks of anesthesia with patient, including possibility of difficulty with spontaneous ventilation under anesthesia necessitating airway intervention, PONV, and rare risks such as cardiac or respiratory or neurological events, and allergic reactions. Discussed the role of CRNA in patient's perioperative care. Patient understands. ?Patient counseled on being higher risk for anesthesia due to comorbidities: severe pulm HTN. Patient was told about increased risk of cardiac and respiratory events, including death. ?)  ? ? ? ? ? ?Anesthesia Quick Evaluation ? ?

## 2021-06-07 NOTE — Progress Notes (Signed)
Mobility Specialist - Progress Note ? ? 06/07/21 1500  ?Mobility  ?Activity Transferred to/from [facility]  ?Level of Assistance Contact guard assist, steadying assist  ?Assistive Device Other (Comment)  ?Distance Ambulated (ft) 4 ft  ?Activity Response Tolerated well  ?$Mobility charge 1 Mobility  ? ? ? ?Pt lying in bed upon arrival, utilizing 2L. Pt teary-eyed complaining of pain in back and R foot, repeatedly requesting medication. RN notified and entered to administer medication mid-session. Pt initially declined any OOB activity d/t severity of pain, however, as mobility prepares to exit pt sits up requesting assistance to bathroom. Pt very impulsive, standing and attempting transfer before safe to do so. Pt then quickly sits in chair and begins to urinate, as author repeatedly states "that's not a toilet". Author assists pt to [facility] where she continues with urinal out and very small BM. Author cleans floors for safe return transfer and provides new gown. Assist for peri-care. Pt ambulates back to bed where she was left with alarm set, needs in reach.  ? ? ?Filiberto Pinks ?Mobility Specialist ?06/07/21, 3:54 PM ? ? ? ? ?

## 2021-06-07 NOTE — Assessment & Plan Note (Addendum)
Continue nutritional supplements.  On megestrol to stimulate her appetite.  Patient encouraged to eat her meals.  Oral intake has improved. ?

## 2021-06-07 NOTE — Progress Notes (Signed)
Patient returned from OR, unable to complete surgery at this time d/t drinking orange juice given by husband just prior. Discussed heparin drip with MD, continue to hold for now. Plan is to return to OR at 5pm. Updated husband and patient and reinforced NPO status. Husband verbalized understanding.  ?

## 2021-06-08 ENCOUNTER — Encounter: Payer: Self-pay | Admitting: Podiatry

## 2021-06-08 ENCOUNTER — Ambulatory Visit: Payer: Medicare Other | Admitting: Medical

## 2021-06-08 DIAGNOSIS — D696 Thrombocytopenia, unspecified: Secondary | ICD-10-CM | POA: Diagnosis not present

## 2021-06-08 DIAGNOSIS — D638 Anemia in other chronic diseases classified elsewhere: Secondary | ICD-10-CM | POA: Diagnosis not present

## 2021-06-08 DIAGNOSIS — I739 Peripheral vascular disease, unspecified: Secondary | ICD-10-CM | POA: Diagnosis present

## 2021-06-08 DIAGNOSIS — I2699 Other pulmonary embolism without acute cor pulmonale: Secondary | ICD-10-CM | POA: Diagnosis not present

## 2021-06-08 DIAGNOSIS — J690 Pneumonitis due to inhalation of food and vomit: Secondary | ICD-10-CM | POA: Diagnosis not present

## 2021-06-08 LAB — CBC WITH DIFFERENTIAL/PLATELET
Abs Immature Granulocytes: 0.82 10*3/uL — ABNORMAL HIGH (ref 0.00–0.07)
Basophils Absolute: 0.1 10*3/uL (ref 0.0–0.1)
Basophils Relative: 1 %
Eosinophils Absolute: 0.1 10*3/uL (ref 0.0–0.5)
Eosinophils Relative: 1 %
HCT: 25.8 % — ABNORMAL LOW (ref 36.0–46.0)
Hemoglobin: 8 g/dL — ABNORMAL LOW (ref 12.0–15.0)
Immature Granulocytes: 12 %
Lymphocytes Relative: 18 %
Lymphs Abs: 1.3 10*3/uL (ref 0.7–4.0)
MCH: 34.8 pg — ABNORMAL HIGH (ref 26.0–34.0)
MCHC: 31 g/dL (ref 30.0–36.0)
MCV: 112.2 fL — ABNORMAL HIGH (ref 80.0–100.0)
Monocytes Absolute: 0.6 10*3/uL (ref 0.1–1.0)
Monocytes Relative: 9 %
Neutro Abs: 4.3 10*3/uL (ref 1.7–7.7)
Neutrophils Relative %: 59 %
Platelets: 120 10*3/uL — ABNORMAL LOW (ref 150–400)
RBC: 2.3 MIL/uL — ABNORMAL LOW (ref 3.87–5.11)
RDW: 19.7 % — ABNORMAL HIGH (ref 11.5–15.5)
Smear Review: NORMAL
WBC Morphology: INCREASED
WBC: 7.2 10*3/uL (ref 4.0–10.5)
nRBC: 0 % (ref 0.0–0.2)

## 2021-06-08 LAB — PHOSPHORUS: Phosphorus: 3.5 mg/dL (ref 2.5–4.6)

## 2021-06-08 LAB — BASIC METABOLIC PANEL
Anion gap: 6 (ref 5–15)
BUN: 14 mg/dL (ref 8–23)
CO2: 27 mmol/L (ref 22–32)
Calcium: 8.3 mg/dL — ABNORMAL LOW (ref 8.9–10.3)
Chloride: 108 mmol/L (ref 98–111)
Creatinine, Ser: 0.97 mg/dL (ref 0.44–1.00)
GFR, Estimated: 58 mL/min — ABNORMAL LOW (ref >60)
Glucose, Bld: 106 mg/dL — ABNORMAL HIGH (ref 70–99)
Potassium: 4.3 mmol/L (ref 3.5–5.1)
Sodium: 141 mmol/L (ref 135–145)

## 2021-06-08 LAB — HEPARIN LEVEL (UNFRACTIONATED): Heparin Unfractionated: 0.29 IU/mL — ABNORMAL LOW (ref 0.30–0.70)

## 2021-06-08 LAB — MAGNESIUM: Magnesium: 1.8 mg/dL (ref 1.7–2.4)

## 2021-06-08 LAB — APTT
aPTT: 51 seconds — ABNORMAL HIGH (ref 24–36)
aPTT: 75 seconds — ABNORMAL HIGH (ref 24–36)

## 2021-06-08 MED ORDER — SODIUM CHLORIDE 0.9 % IV SOLN
250.0000 mL | INTRAVENOUS | Status: DC | PRN
Start: 2021-06-08 — End: 2021-06-14

## 2021-06-08 MED ORDER — SODIUM CHLORIDE 0.9% FLUSH: 3.0000 mL | INTRAVENOUS | Status: DC | PRN

## 2021-06-08 MED ORDER — ARGATROBAN 50 MG/50ML IV SOLN
1.0000 ug/kg/min | INTRAVENOUS | Status: DC
  Administered 2021-06-08 – 2021-06-11 (×6): 1 ug/kg/min via INTRAVENOUS
  Filled 2021-06-08 (×6): qty 50

## 2021-06-08 MED ORDER — LACTATED RINGERS IV SOLN
INTRAVENOUS | Status: DC
Start: 2021-06-08 — End: 2021-06-09
  Filled 2021-06-08 (×2): qty 1000

## 2021-06-08 MED ORDER — SODIUM CHLORIDE 0.9% FLUSH
3.0000 mL | Freq: Two times a day (BID) | INTRAVENOUS | Status: DC
  Administered 2021-06-09 – 2021-06-14 (×7): 3 mL via INTRAVENOUS

## 2021-06-08 NOTE — Assessment & Plan Note (Addendum)
Significant drop in platelet count noted.  More than 50% drop.  ?Discussed with pharmacy.  HIT antibodies ordered.  Patient was transitioned over to argatroban.   ?Platelet counts have rebounded.   ?Heparin-induced platelet antibody level is 0.823 OD.  This is a indeterminate diagnosis with a 4T score of 6.  In view of this a functional assay, SRA, was ordered.  Results are pending. ?Platelet counts are now normal.  Transitioned to apixaban.  Stable. ?

## 2021-06-08 NOTE — Progress Notes (Signed)
Occupational Therapy Treatment ?Patient Details ?Name: Jenna Dudley ?MRN: [id number] ?DOB: 1935/08/14 ?Today's Date: 06/08/2021 ? ? ?History of present illness Patient is an 86 year old female with a PMH (+) for chronic diastolic CHF, GERD, bipolar disorder, history of CVA, and right third toe wound. Patient presented to [facility] ED on 06/01/21 for chest pain, and was admitted for Aspiration pneumonia. s/p R 3rd toe amputation 06/07/21 ?  ?OT comments ? Pt in bed upon OT arrival.  Agreeable to sit EOB to sip juice.  Pt sat up briefly, ~1 min with min guard to sip juice, and forcefully returned herself to supine.  Pt declined anything from her food tray.  OT assisted pt to roll R/L to fix chuck pad beneath pt, requiring min A only d/t pt resisting activity.  Pt then remained in fetal position with eyes closed and continued to ask to be left alone.  OT assisted with elevating R foot with pt in L side lying position.  RN reports palliative care consult has been put in.  Will continue to make additional attempts to work towards OT goals unless Palliative Care order is put in place.  Pt left with spouse in room, bed alarm set, and all needed supplies within reach.   ? ?Recommendations for follow up therapy are one component of a multi-disciplinary discharge planning process, led by the attending physician.  Recommendations may be updated based on patient status, additional functional criteria and insurance authorization. ?   ?Follow Up Recommendations ? Home health OT  ?  ?Assistance Recommended at Discharge Frequent or constant Supervision/Assistance  ?Patient can return home with the following ? A little help with walking and/or transfers;Assistance with cooking/housework;Direct supervision/assist for financial management;Assist for transportation;Direct supervision/assist for medications management;A lot of help with bathing/dressing/bathroom;Assistance with feeding ?  ?Equipment Recommendations ? None recommended by OT  ?   ?Recommendations for Other Services   ? ?  ?Precautions / Restrictions Precautions ?Precautions: Fall ?Precaution Comments: pt is impulsive; RN reports pt got up earlier today from chair, alarm went off, and pt was already back in bed with nursing right outside the door. ?Restrictions ?Weight Bearing Restrictions: Yes ?RLE Weight Bearing: Partial weight bearing ?Other Position/Activity Restrictions: per podiatrist, minimal weight bearing on R LE for transitions only  ? ? ?  ? ?Mobility Bed Mobility ?Overal bed mobility: Needs Assistance ?Bed Mobility: Supine to Sit, Sit to Supine ?Rolling: Min assist ?  ?Supine to sit: Mod assist ?Sit to supine: Min assist ?  ?General bed mobility comments: Pt sat EOB to sip drink, returned herself to supine before RN could give a spoonful of meds, and pt declined all other activity attempts.  Pt rolled today with min A to adjust chuck pad beneath her, but min A only d/t pt resisting activity. ?Patient Response: Anxious ? ?Transfers ?  ?  ?  ?  ?  ?  ?  ?  ?  ?General transfer comment: transfers not assessed by OT today d/t pt non-compliance with activity progression. ?  ?  ?Balance Overall balance assessment: Needs assistance ?Sitting-balance support: Feet unsupported, Single extremity supported ?Sitting balance-Leahy Scale: Fair ?Sitting balance - Comments: sat EOB min guard to sip drink ?  ?  ?  ?Standing balance comment: standing balance not assessed during OT session ?  ?  ?  ?  ?  ?  ?  ?  ?  ?  ?  ?   ? ?ADL either performed or assessed with clinical judgement  ? ?  ADL Overall ADL's : Needs assistance/impaired ?Eating/Feeding: Sitting;Minimal assistance ?Eating/Feeding Details (indicate cue type and reason): sat EOB to sip juice; min guard to maintain sitting balance ?  ?  ?  ?  ?  ?  ?  ?  ?  ?  ?  ?  ?  ?  ?  ?  ?Functional mobility during ADLs: Moderate assistance ?General ADL Comments: transition supine<>sit with mod A; pt then declined all other activity and returned to  supine in fetal position with eyes closed. ?  ? ?Extremity/Trunk Assessment Upper Extremity Assessment ?Upper Extremity Assessment: Generalized weakness ?  ?Lower Extremity Assessment ?Lower Extremity Assessment: Generalized weakness ?  ?  ?  ? ?Vision Patient Visual Report: No change from baseline ?  ?  ?   ?  ?   ?  ? ?Cognition Arousal/Alertness: Awake/alert ?Behavior During Therapy: Agitated ?  ?  ?  ?  ?  ?  ?  ?  ?  ?  ?  ?  ?  ?Following Commands: Follows one step commands inconsistently ?Safety/Judgement: Decreased awareness of deficits, Decreased awareness of safety ?Awareness: Intellectual ?  ?General Comments: continually states "I'm just scared" ?  ?  ?   ?   ? ?  ?   ? ? ?  ?    ? ? ?Pertinent Vitals/ Pain       Pain Assessment ?Pain Assessment: Faces ?Faces Pain Scale: Hurts a little bit ?Breathing: normal ?Negative Vocalization: occasional moan/groan, low speech, negative/disapproving quality ?Facial Expression: sad, frightened, frown ?Body Language: tense, distressed pacing, fidgeting ?Consolability: distracted or reassured by voice/touch ?PAINAD Score: 4 ?Pain Location: R foot ?Pain Descriptors / Indicators: Guarding ?Pain Intervention(s): Repositioned ? ?Home Living   ?  ?  ?  ?  ?  ?  ?  ?  ?  ?  ?  ?  ?  ?  ?  ?  ?  ?  ? ?  ?    ?  ?  ?  ?   ? ?Frequency ? Min 2X/week  ? ? ? ? ?  ?Progress Toward Goals ? ?OT Goals(current goals can now be found in the care plan section) ? Progress towards OT goals: Not progressing toward goals - comment ? ?Acute Rehab OT Goals ?Patient Stated Goal: to be left alone ?OT Goal Formulation: With patient ?Time For Goal Achievement: 06/19/21 ?Potential to Achieve Goals: Poor  ?Plan Discharge plan remains appropriate   ? ? ? ? ?   ?  ?  ?  ?  ? ?  ?AM-PAC OT "6 Clicks" Daily Activity     ?Outcome Measure ? ? Help from another person eating meals?: A Lot ?Help from another person taking care of personal grooming?: A Lot ?Help from another person toileting, which includes  using toliet, bedpan, or urinal?: A Lot ?Help from another person bathing (including washing, rinsing, drying)?: A Lot ?Help from another person to put on and taking off regular upper body clothing?: A Little ?Help from another person to put on and taking off regular lower body clothing?: A Lot ?6 Click Score: 13 ? ?  ?End of Session   ? ?OT Visit Diagnosis: Muscle weakness (generalized) (M62.81) ?  ?Activity Tolerance Treatment limited secondary to agitation;Other (comment) (limited by anxiety) ?  ?Patient Left in bed;with call bell/phone within reach;with bed alarm set;with family/visitor present ?  ?Nurse Communication   ?  ? ?   ? ?Time: 4315-4008 ?OT Time Calculation (min): 14  min ? ?Charges: OT General Charges ?$OT Visit: 1 Visit ?OT Treatments ?$Self Care/Home Management : 8-22 mins ? ?Danelle EarthlyNancy Desiray Orchard, MS, OTR/L ? ? ?Otis DialsNancy K Vasil Juhasz ?06/08/2021, 5:35 PM ?

## 2021-06-08 NOTE — Progress Notes (Signed)
? ?[facility] ?PROGRESS NOTE ? ? ?Jenna Dudley SEG:[id number] DOB: 12/03/1935 DOA: 06/01/2021  6 ?DOS: the patient was seen and examined on 06/08/2021 ? ?PCP: Aderoju, Jadene Pierini, MD ? ?Brief History and Hospital Course:  ?Jenna Dudley is a pleasant 86 y.o. female with medical history significant for chronic diastolic CHF, GERD, bipolar disorder, history of CVA, and right third toe wound, now presenting to the emergency department for evaluation of chest pain. ?In the hospital, she had heart rate of 114, resp irate 28, lactic acid 4.0.  CTA chest showed segmental PEs, right side opacity with a fluid level.  Also showed additional groundglass opacities, thickened esophagus wall.  And moderate to large right-sided pleural effusion.  Patient was given IV fluids, lactic acid level has dropped down to normal.  She is also started on antibiotics with Unasyn and vancomycin. ?Patient also has a left third toe osteomyelitis peripheral vascular disease.  Angioplasty and stent placement was performed on 3/13. ?Toe amputation on 3/14. ? ?Consultants: Podiatry.  Vascular surgery.  Pulmonology ? ?Procedures:  ? ?Toe amputation on the right, 3/14. ? ?3/13 ?Aortogram and selective right lower extremity angiogram ?Percutaneous transluminal angioplasty of right peroneal artery and tibioperoneal trunk with 3 mm diameter angioplasty balloon ?Percutaneous transluminal angioplasty of right distal SFA and popliteal arteries with 5 mm diameter by 30 cm length Lutonix drug-coated angioplasty balloon ?Viabahn stent placement to the distal right SFA and popliteal arteries with 6 mm diameter by 25 cm length stent for greater than 50% stenosis after angioplasty ? ? ?Subjective: ?Patient mentions that she is feeling very weak.  Has very poor appetite.  Denies any chest pain shortness of breath.  No nausea vomiting.  Denies any abdominal pain. ? ? ? ?Assessment/Plan: ? ? ?* Aspiration pneumonia ([place]) ?Respiratory status is stable.   Patient seen by speech therapy.  On dysphagia 1 diet.  Remains on Unasyn.   ? ?Pleural effusion on right ?Status post thoracentesis with removing 1.2 L.  Lab study showed 79% neutrophils.  Cultures are negative. ?Glucose high, protein low.  LDH 61.  Appears to be transudate. ? ?Necrotizing pneumonia ([place]) ?Patient seen by pulmonology.  Continue on antibiotics.  Respiratory status is stable for the most part.   ?Blood culture: 1/4 bottle blood culture positive for Corynebacterium.  This is a contaminant. ? ?Severe sepsis ([place]) ?Patient met severe sepsis criteria with tachycardia and tachypnea.  Lactic acid level of 4.0.  This is secondary to pneumonia and toe osteomyelitis. ?Patient is now clinically stable. ? ?Toe osteomyelitis, right ([place]) ?Patient has been evaluated by podiatry,  duplex ultrasound showed peripheral arterial disease, MRI showed third toe osteomyelitis.  Patient has had vascular intervention with angioplasty, stent placement on 3/13.  ?Underwent right toe amputation on 3/14. ? ?PAD (peripheral artery disease) ([place]) ?Duplex ultrasound showed peripheral arterial disease, MRI showed third toe osteomyelitis.  Patient has had vascular intervention with angioplasty, stent placement on 3/13.  ?Patient remains on Plavix and statin. ? ?Pulmonary emboli ([place]) ?Remains on IV heparin.  Platelet counts noted to drop significantly.  More than 50% decrease.  See below.  To be switched over to argatroban.   ? ?Thrombocytopenia ([place]) ?Significant drop in platelet count noted.  More than 50% drop.  Discussed with pharmacy.  HIT panel to be ordered.  Switch heparin to argatroban. ? ?Anorexia ?Continues to have very poor appetite.  Started on megestrol.  Patient told importance of eating and drinking.  Continue with nutritional supplements.   ? ?  Anemia of chronic disease ?Hemoglobin is low but stable.  The level was noted to be 377.  She was apparently already taking oral B12 supplements.  She also received injections  of B12 during this hospital stay.   ? ?Bipolar 1 disorder ([place]) ?Mentation is stable.  Noted to be on Seroquel. ? ?Delirium with dementia ?Stable.  No evidence for delirium at this time. ? ?Protein-calorie malnutrition, severe ?Continue nutritional supplements.  On megestrol to stimulate her appetite.   ? ?Chest pain ?Secondary to PE.  None currently. ? ?Elevated troponin ?Peak troponin was 108, dropped down to 94.  This is secondary to sepsis and PE. ? ?Hypokalemia ?Potassium 4.3.  Magnesium 1.8. ? ? ? ?DVT Prophylaxis: Argatroban ?Code Status: Full code ?Family Communication: No family at bedside ?Disposition Plan: To be determined.  PT and OT evaluation is pending ? ?Status is: Inpatient ?Remains inpatient appropriate because: Osteomyelitis right toe, peripheral artery disease, PE, thrombocytopenia ? ? ? ? ?Medications: Scheduled: ? (feeding supplement) PROSource Plus  30 mL Oral TID BM  ? vitamin C  500 mg Oral BID  ? clopidogrel  75 mg Oral Daily  ? feeding supplement  1 Container Oral TID BM  ? mouth rinse  15 mL Mouth Rinse BID  ? megestrol  400 mg Oral Daily  ? metoprolol succinate  50 mg Oral Daily  ? multivitamin with minerals  1 tablet Oral Daily  ? pantoprazole  40 mg Oral Daily  ? QUEtiapine  25 mg Oral QHS  ? simvastatin  40 mg Oral QHS  ? sodium chloride flush  3 mL Intravenous Q12H  ? sodium chloride flush  3 mL Intravenous Q12H  ? sucralfate  1 g Oral TID WC & HS  ? zinc sulfate  220 mg Oral Daily  ? ?Continuous: ? sodium chloride Stopped (06/04/21 1324)  ? sodium chloride    ? ampicillin-sulbactam (UNASYN) IV Stopped (06/08/21 7517)  ? argatroban 1 mcg/kg/min (06/08/21 1019)  ? lactated ringers with kcl 50 mL/hr at 06/08/21 1019  ? ?GYF:VCBSWH chloride, sodium chloride, acetaminophen **OR** acetaminophen, albuterol, diphenhydrAMINE, morphine injection, ondansetron **OR** ondansetron (ZOFRAN) IV, ondansetron (ZOFRAN) IV, polyethylene glycol, sodium chloride flush ? ?Antibiotics: ?Anti-infectives  (From admission, onward)  ? ? Start     Dose/Rate Route Frequency Ordered Stop  ? 06/07/21 0900  Ampicillin-Sulbactam (UNASYN) 3 g in sodium chloride 0.9 % 100 mL IVPB       ? 3 g ?200 mL/hr over 30 Minutes Intravenous Every 6 hours 06/07/21 0812 06/12/21 0859  ? 06/06/21 1300  ceFAZolin (ANCEF) IVPB 2g/100 mL premix       ? 2 g ?200 mL/hr over 30 Minutes Intravenous  Once 06/06/21 1249 06/06/21 1354  ? 06/04/21 2100  vancomycin (VANCOCIN) IVPB 1000 mg/200 mL premix       ? 1,000 mg ?200 mL/hr over 60 Minutes Intravenous Every 36 hours 06/04/21 1104 06/08/21 0730  ? 06/03/21 0900  vancomycin (VANCOREADY) IVPB 1250 mg/250 mL  Status:  Discontinued       ? 1,250 mg ?166.7 mL/hr over 90 Minutes Intravenous Every 36 hours 06/02/21 0538 06/02/21 0700  ? 06/03/21 0900  vancomycin (VANCOREADY) IVPB 1250 mg/250 mL  Status:  Discontinued       ? 1,250 mg ?166.7 mL/hr over 90 Minutes Intravenous Every 36 hours 06/02/21 0700 06/04/21 1104  ? 06/02/21 0800  Ampicillin-Sulbactam (UNASYN) 3 g in sodium chloride 0.9 % 100 mL IVPB       ? 3 g ?200  mL/hr over 30 Minutes Intravenous Every 8 hours 06/02/21 0527 06/07/21 0759  ? 06/02/21 0100  cefTRIAXone (ROCEPHIN) 2 g in sodium chloride 0.9 % 100 mL IVPB  Status:  Discontinued       ? 2 g ?200 mL/hr over 30 Minutes Intravenous Every 24 hours 06/02/21 0048 06/02/21 0440  ? 06/02/21 0100  azithromycin (ZITHROMAX) 500 mg in sodium chloride 0.9 % 250 mL IVPB  Status:  Discontinued       ? 500 mg ?250 mL/hr over 60 Minutes Intravenous Every 24 hours 06/02/21 0048 06/02/21 0440  ? 06/02/21 0100  vancomycin (VANCOCIN) IVPB 1000 mg/200 mL premix       ? 1,000 mg ?200 mL/hr over 60 Minutes Intravenous  Once 06/02/21 0049 06/02/21 0319  ? 06/02/21 0030  cefTRIAXone (ROCEPHIN) 1 g in sodium chloride 0.9 % 100 mL IVPB  Status:  Discontinued       ? 1 g ?200 mL/hr over 30 Minutes Intravenous  Once 06/02/21 0026 06/02/21 0049  ? 06/02/21 0030  azithromycin (ZITHROMAX) 500 mg in sodium chloride 0.9  % 250 mL IVPB  Status:  Discontinued       ? 500 mg ?250 mL/hr over 60 Minutes Intravenous  Once 06/02/21 0026 06/02/21 0049  ? ?  ? ? ?Objective: ? ?Vital Signs ? ?Vitals:  ? 06/07/21 2358 06/08/21 0335

## 2021-06-08 NOTE — Assessment & Plan Note (Signed)
Duplex ultrasound showed peripheral arterial disease, MRI showed third toe osteomyelitis.  Patient has had vascular intervention with angioplasty, stent placement on 3/13.  ?Patient remains on Plavix and statin. ?

## 2021-06-08 NOTE — Progress Notes (Signed)
[facility] ? ?Daily Progress Note ? ? ?Subjective  -  ? ?Patient is status post right lower extremity angiogram due to gangrenous change of her right second toe.  Intervention included: ?Procedure(s) Performed: ?            1.  Ultrasound guidance for vascular access left femoral artery ?            2.  Catheter placement into right common femoral artery from left femoral approach ?            3.  Aortogram and selective right lower extremity angiogram ?            4.  Percutaneous transluminal angioplasty of right peroneal artery and tibioperoneal trunk with 3 mm diameter angioplasty balloon ?            5.  Percutaneous transluminal angioplasty of right distal SFA and popliteal arteries with 5 mm diameter by 30 cm length Lutonix drug-coated angioplasty balloon ?            6.  Viabahn stent placement to the distal right SFA and popliteal arteries with 6 mm diameter by 25 cm length stent for greater than 50% stenosis after angioplasty ?            7.  StarClose closure device left femoral artery ? ? ?Patient resting comfortably today.  There was incidental bleeding from her groin site yesterday which was treated with lidocaine and epi.  No further issues have been noted.  Patient is scheduled for upcoming right second toe amputation. ? ?Objective ?Vitals:  ? 06/07/21 1900 06/07/21 1930 06/07/21 2011 06/07/21 2358  ?BP: (!) 147/81 (!) 145/75 135/71 (!) 114/57  ?Pulse: 98 95 96 81  ?Resp: (!) 24 (!) 22 18 18   ?Temp:  97.6 ?F (36.4 ?C) 98.1 ?F (36.7 ?C) 98.1 ?F (36.7 ?C)  ?TempSrc:      ?SpO2: 100% 100% 100% 100%  ?Weight:      ?Height:      ? ? ?Intake/Output Summary (Last 24 hours) at 06/08/2021 0015 ?Last data filed at 06/07/2021 1929 ?Gross per 24 hour  ?Intake 1831.13 ml  ?Output 1 ml  ?Net 1830.13 ml  ? ? ?PULM  CTAB ?CV  RRR ?VASC  right foot warm, groin soft no bleeding ? ?Laboratory ?CBC ?   ?Component Value Date/Time  ? WBC 9.8 06/07/2021 1116  ? HGB 8.7 (L) 06/07/2021 1116  ? HGB 10.6  (L) 09/01/2019 1348  ? HCT 28.1 (L) 06/07/2021 1116  ? HCT 31.4 (L) 09/01/2019 1348  ? PLT 142 (L) 06/07/2021 1116  ? PLT 207 09/01/2019 1348  ? ? ?BMET ?   ?Component Value Date/Time  ? NA 141 06/06/2021 0302  ? NA 140 02/07/2021 0927  ? NA 137 10/14/2012 1224  ? K 3.7 06/06/2021 0302  ? K 3.2 (L) 10/14/2012 1224  ? CL 108 06/06/2021 0302  ? CL 104 10/14/2012 1224  ? CO2 27 06/06/2021 0302  ? CO2 26 10/14/2012 1224  ? GLUCOSE 98 06/06/2021 0302  ? GLUCOSE 149 (H) 10/14/2012 1224  ? BUN 14 06/06/2021 1604  ? BUN 21 02/07/2021 0927  ? BUN 19 (H) 10/14/2012 1224  ? CREATININE 0.78 06/06/2021 1604  ? CREATININE 0.96 10/14/2012 1224  ? CALCIUM 8.3 (L) 06/06/2021 0302  ? CALCIUM 8.9 10/14/2012 1224  ? GFRNONAA >60 06/06/2021 1604  ? GFRNONAA 58 (L) 10/14/2012 1224  ? GFRAA 76 09/01/2019 1348  ? GFRAA >  60 10/14/2012 1224  ? ? ?Assessment/Planning: ?POD #1 s/p right lower extremity angiogram due to ulceration ? ?Currently the patient is resting comfortably, family reports the patient has been much more alert with decreased pain since intervention. ?Patient will have upcoming amputation of right third toe by podiatry today. ?We will follow-up with patient in office in 3 to 4 weeks with ABIs ? ?Georgiana Spinner ? ?06/08/2021, 12:15 AM ? ? ? ?  ?

## 2021-06-08 NOTE — Progress Notes (Signed)
Physical Therapy Treatment ?Patient Details ?Name: Jenna Dudley ?MRN: [id number] ?DOB: 1936/03/10 ?Today's Date: 06/08/2021 ? ? ?History of Present Illness Patient is an 86 year old female with a PMH (+) for chronic diastolic CHF, GERD, bipolar disorder, history of CVA, and right third toe wound. Patient [facility] to [facility] ED on 06/01/21 for chest pain, and was admitted for Aspiration pneumonia. s/p R 3rd toe amputation 06/07/21 ? ?  ?PT Comments  ? ? Patient received in bed, she is agreeable to PT session. States "I'll try" "I'm just scared." Patient requires re-assurance during session. She is alert and oriented to place, situation. Mod independent with bed mobility. Min A for pivot from bed to recliner. She will continue to benefit from skilled PT while here to improve safety and independence with mobility.   ?  ?Recommendations for follow up therapy are one component of a multi-disciplinary discharge planning process, led by the attending physician.  Recommendations may be updated based on patient status, additional functional criteria and insurance authorization. ? ?Follow Up Recommendations ? Skilled nursing-short term rehab (<3 hours/day) ?  ?  ?Assistance Recommended at Discharge Frequent or constant Supervision/Assistance  ?Patient can return home with the following Help with stairs or ramp for entrance;A little help with walking and/or transfers;A little help with bathing/dressing/bathroom ?  ?Equipment Recommendations ? None recommended by PT  ?  ?Recommendations for Other Services   ? ? ?  ?Precautions / Restrictions Precautions ?Precautions: Fall ?Restrictions ?Weight Bearing Restrictions: Yes ?RLE Weight Bearing: Partial weight bearing ?Other Position/Activity Restrictions: per podiatrist, minimal weight bearing on R LE for transitions only  ?  ? ?Mobility ? Bed Mobility ?Overal bed mobility: Modified Independent ?Bed Mobility: Supine to Sit ?  ?  ?Supine to sit: Modified independent (Device/Increase time) ?   ?  ?  ?  ? ?Transfers ?Overall transfer level: Needs assistance ?Equipment used: None ?Transfers: Bed to chair/wheelchair/BSC ?  ?  ?  ?  ?  ?  ?General transfer comment: squat pivot from bed to recliner. MinA ?  ? ?Ambulation/Gait ?  ?  ?  ?  ?Gait velocity: decr ?  ?  ?General Gait Details: no ambulation now since amputation ? ? ?Stairs ?  ?  ?  ?  ?  ? ? ?Wheelchair Mobility ?  ? ?Modified Rankin (Stroke Patients Only) ?  ? ? ?  ?Balance Overall balance assessment: Needs assistance ?Sitting-balance support: Feet supported ?Sitting balance-Leahy Scale: Good ?Sitting balance - Comments: able to sit edge of bed with supervision ?  ?Standing balance support: Single extremity supported, During functional activity ?Standing balance-Leahy Scale: Fair ?Standing balance comment: able to pivot to recliner with min A. Requires cues to wait until I am ready to assist her ?  ?  ?  ?  ?  ?  ?  ?  ?  ?  ?  ?  ? ?  ?Cognition Arousal/Alertness: Awake/alert ?Behavior During Therapy: [facility] for tasks assessed/performed ?Overall Cognitive Status: No family/caregiver present to determine baseline cognitive functioning ?Area of Impairment: Awareness, Problem solving ?  ?  ?  ?  ?  ?  ?  ?  ?Orientation Level: Disoriented to, Time, Situation ?Current Attention Level: Sustained ?  ?Following Commands: Follows one step commands inconsistently ?Safety/Judgement: Decreased awareness of deficits, Decreased awareness of safety ?Awareness: Intellectual ?Problem Solving: Requires verbal cues, Requires tactile cues ?General Comments: continually states "I'm just scared" ?  ?  ? ?  ?Exercises   ? ?  ?General  Comments   ?  ?  ? ?Pertinent Vitals/Pain Pain Assessment ?Pain Assessment: Faces ?Faces Pain Scale: Hurts a little bit ?Breathing: normal ?Negative Vocalization: none ?Facial Expression: sad, frightened, frown ?Body Language: tense, distressed pacing, fidgeting ?Consolability: distracted or reassured by voice/touch ?PAINAD Score: 3   ? ? ?Home Living   ?  ?  ?  ?  ?  ?  ?  ?  ?  ?   ?  ?Prior Function    ?  ?  ?   ? ?PT Goals (current goals can now be found in the care plan section) Acute Rehab PT Goals ?Patient Stated Goal: none stated ?Time For Goal Achievement: 06/19/21 ?Progress towards PT goals: Progressing toward goals ? ?  ?Frequency ? ? ? Min 2X/week ? ? ? ?  ?PT Plan Discharge plan needs to be updated  ? ? ?Co-evaluation   ?  ?  ?  ?  ? ?  ?AM-PAC PT "6 Clicks" Mobility   ?Outcome Measure ? Help needed turning from your back to your side while in a flat bed without using bedrails?: A Little ?Help needed moving from lying on your back to sitting on the side of a flat bed without using bedrails?: A Little ?Help needed moving to and from a bed to a chair (including a wheelchair)?: A Little ?Help needed standing up from a chair using your arms (e.g., wheelchair or bedside chair)?: A Little ?Help needed to walk in hospital room?: A Lot ?Help needed climbing 3-5 steps with a railing? : A Lot ?6 Click Score: 16 ? ?  ?End of Session Equipment Utilized During Treatment: Oxygen ?Activity Tolerance: Patient tolerated treatment well ?Patient left: in chair;with chair alarm set;with nursing/sitter in room;with call bell/phone within reach ?Nurse Communication: Mobility status;Weight bearing status ?PT Visit Diagnosis: Unsteadiness on feet (R26.81);Muscle weakness (generalized) (M62.81);Other abnormalities of gait and mobility (R26.89);Pain ?Pain - Right/Left: Right ?Pain - part of body: Ankle and joints of foot ?  ? ? ?Time: 0355-9741 ?PT Time Calculation (min) (ACUTE ONLY): 15 min ? ?Charges:  $Therapeutic Activity: 8-22 mins          ?          ? ?Lissa Merlin, PT, GCS ?06/08/21,3:01 PM ? ?

## 2021-06-08 NOTE — Plan of Care (Signed)
PMT note:  ? ?Patient is resting in bed. She opens her eyes briefly and closes them. No family at bedside. Nurse states she has not been oriented. Will need to speak with husband.    ?

## 2021-06-08 NOTE — Progress Notes (Addendum)
ANTICOAGULATION CONSULT NOTE ? ?Pharmacy Consult for argatroban ?Indication: pulmonary embolus ? ?Patient Measurements: ?Height: 5' (152.4 cm) ?Weight: 53 kg (116 lb 13.5 oz) ?IBW/kg (Calculated) : 45.5 ?Heparin Dosing Weight: 47.6 kg ? ?Labs: ?Recent Labs  ?  06/06/21 ?0302 06/06/21 ?0619 06/06/21 ?1604 06/07/21 ?1116 06/08/21 ?0439 06/08/21 ?0753  ?HGB 8.2* 8.2*  --  8.7* 8.0*  --   ?HCT 25.8* 26.0*  --  28.1* 25.8*  --   ?PLT 157 163  --  142* 120*  --   ?HEPARINUNFRC 0.34 0.45  --  0.34  --  0.29*  ?CREATININE 0.77  --  0.78  --  0.97  --   ? ? ?Estimated Creatinine Clearance: 31.6 mL/min (by C-G formula based on SCr of 0.97 mg/dL). ? ?Assessment: ?86 year old female admitted with CP found to be a result of segmental/subsegmental PEs in upper and lower lobes. Concomitant toe amputation for OM completed 3/14 PM. ? ?On day 7 of heparin. Platelets have dropped from 336 (3/8) > 142 (3/14 AM) > 120 (3/14). Ultimately, platelets have decreased by > 50% from admission. History of anemia, though Hgb has remained stable at ~8. Estimated 4T score for HITT is ~5 (intermediate risk).  ? ?LFTs WNL. BL aPTT 31. ? ?Goal of Therapy:  ?aPTT 50-90 seconds ?Monitor platelets by anticoagulation protocol: Yes ?  ?Plan: Proceeding with HITT work-up per MD.  ?Start argatroban 1 mcg/kg/min (53 mcg/min) continuous infusion ?4 hour aPTT level  ?CBC, aPTT daily ?Follow results of HIT Ab and SRA (both in process).  ? ?Jaynie Bream, PharmD ?Pharmacy Resident  ?06/08/2021 ?8:52 AM ? ?

## 2021-06-08 NOTE — Progress Notes (Addendum)
ANTICOAGULATION CONSULT NOTE ? ?Pharmacy Consult for argatroban ?Indication: pulmonary embolus ? ?Patient Measurements: ?Height: 5' (152.4 cm) ?Weight: 53 kg (116 lb 13.5 oz) ?IBW/kg (Calculated) : 45.5 ?Heparin Dosing Weight: 47.6 kg ? ?Labs: ?Recent Labs  ?  06/06/21 ?0302 06/06/21 ?0619 06/06/21 ?1604 06/07/21 ?1116 06/08/21 ?0439 06/08/21 ?0753 06/08/21 ?1425  ?HGB 8.2* 8.2*  --  8.7* 8.0*  --   --   ?HCT 25.8* 26.0*  --  28.1* 25.8*  --   --   ?PLT 157 163  --  142* 120*  --   --   ?APTT  --   --   --   --   --   --  51*  ?HEPARINUNFRC 0.34 0.45  --  0.34  --  0.29*  --   ?CREATININE 0.77  --  0.78  --  0.97  --   --   ? ? ?Estimated Creatinine Clearance: 31.6 mL/min (by C-G formula based on SCr of 0.97 mg/dL). ? ?Assessment: ?86 year old female admitted with CP found to be a result of segmental/subsegmental PEs in upper and lower lobes. Concomitant toe amputation for OM completed 3/14 PM. ? ?On day 7 of heparin. Platelets have dropped from 336 (3/8) > 142 (3/14 AM) > 120 (3/14). Ultimately, platelets have decreased by > 50% from admission. History of anemia, though Hgb has remained stable at ~8. Estimated 4T score for HITT is ~5 (intermediate risk).  ? ?LFTs WNL. Baseline aPTT 31. ? ?Date/Time aPTT Comment/Rate ?0315 1425 51 1 mcg/kg/min (53 mcg/min); therapeutic ? ?Goal of Therapy:  ?aPTT 50-90 seconds ?Monitor platelets by anticoagulation protocol: Yes ?  ?Plan: aPTT therapeutic x 1 ?Continue argatroban 1 mcg/kg/min (53 mcg/min) continuous infusion ?4 hour aPTT level. If next level therapeutic, may transition to daily aPTT. ?CBC daily ?Follow results of HIT Ab and SRA (both in process).  ? ? ?Wynelle Cleveland, PharmD ?Pharmacy Resident  ?06/08/2021 ?3:13 PM ? ? ?

## 2021-06-08 NOTE — Progress Notes (Signed)
PT Cancellation Note ? ?Patient Details ?Name: Jenna Dudley ?MRN: [id number] ?DOB: [birth year] ? ? ?Cancelled Treatment:    Reason Eval/Treat Not Completed: Patient not medically ready. Patient had toe amputation yesterday. No weight bearing information in chart. Secure chat sent to surgeon. Will hold until more information available. ? ? ?Amaryllis Malmquist ?06/08/2021, 11:37 AM ?

## 2021-06-08 NOTE — Treatment Plan (Signed)
Came to see patient, she is arousable but falls back asleep. We will attempt to see her again tomorrow. No further OR plans from our service currently.  ?

## 2021-06-08 NOTE — Progress Notes (Signed)
ANTICOAGULATION CONSULT NOTE ? ?Pharmacy Consult for argatroban ?Indication: pulmonary embolus ? ?Patient Measurements: ?Height: 5' (152.4 cm) ?Weight: 53 kg (116 lb 13.5 oz) ?IBW/kg (Calculated) : 45.5 ?Heparin Dosing Weight: 47.6 kg ? ?Labs: ?Recent Labs  ?  06/06/21 ?0302 06/06/21 ?0619 06/06/21 ?1604 06/07/21 ?1116 06/08/21 ?0439 06/08/21 ?0753 06/08/21 ?1425  ?HGB 8.2* 8.2*  --  8.7* 8.0*  --   --   ?HCT 25.8* 26.0*  --  28.1* 25.8*  --   --   ?PLT 157 163  --  142* 120*  --   --   ?APTT  --   --   --   --   --   --  51*  ?HEPARINUNFRC 0.34 0.45  --  0.34  --  0.29*  --   ?CREATININE 0.77  --  0.78  --  0.97  --   --   ? ? ?Estimated Creatinine Clearance: 31.6 mL/min (by C-G formula based on SCr of 0.97 mg/dL). ? ?Assessment: ?86 year old female admitted with CP found to be a result of segmental/subsegmental PEs in upper and lower lobes. Concomitant toe amputation for OM completed 3/14 PM. ? ?Platelets had dropped from 336 (3/8) > 142 (3/14 AM) > 120 (3/14). Ultimately, platelets had decreased by > 50% from admission. History of anemia, though Hgb has remained stable at ~8. Estimated 4T score for HITT is ~5 (intermediate risk).  ? ?Baseline aPTT 31. ? ?Date/Time aPTT Comment/Rate ?0315 1425 51 1 mcg/kg/min (53 mcg/min) ?0315 1907  75  1 mcg/kg/min (53 mcg/min) ? ?Goal of Therapy:  ?aPTT 50-90 seconds ?Monitor platelets by anticoagulation protocol: Yes ?  ?Plan: aPTT therapeutic x 2 ?Continue argatroban 1 mcg/kg/min (53 mcg/min) continuous infusion ?may transition to daily aPTT: next 06/09/21 am ?CBC daily ?Follow results of HIT Ab and SRA (both in process).  ? ? ?Lowella Bandy, PharmD ?06/08/2021 ?4:51 PM ? ? ?

## 2021-06-09 DIAGNOSIS — Z7189 Other specified counseling: Secondary | ICD-10-CM | POA: Diagnosis not present

## 2021-06-09 DIAGNOSIS — I2699 Other pulmonary embolism without acute cor pulmonale: Secondary | ICD-10-CM | POA: Diagnosis not present

## 2021-06-09 DIAGNOSIS — J690 Pneumonitis due to inhalation of food and vomit: Secondary | ICD-10-CM | POA: Diagnosis not present

## 2021-06-09 DIAGNOSIS — D696 Thrombocytopenia, unspecified: Secondary | ICD-10-CM | POA: Diagnosis not present

## 2021-06-09 DIAGNOSIS — D638 Anemia in other chronic diseases classified elsewhere: Secondary | ICD-10-CM | POA: Diagnosis not present

## 2021-06-09 LAB — CBC
HCT: 23.7 % — ABNORMAL LOW (ref 36.0–46.0)
Hemoglobin: 7.1 g/dL — ABNORMAL LOW (ref 12.0–15.0)
MCH: 34 pg (ref 26.0–34.0)
MCHC: 30 g/dL (ref 30.0–36.0)
MCV: 113.4 fL — ABNORMAL HIGH (ref 80.0–100.0)
Platelets: 94 10*3/uL — ABNORMAL LOW (ref 150–400)
RBC: 2.09 MIL/uL — ABNORMAL LOW (ref 3.87–5.11)
RDW: 19.6 % — ABNORMAL HIGH (ref 11.5–15.5)
WBC: 6 10*3/uL (ref 4.0–10.5)
nRBC: 0 % (ref 0.0–0.2)

## 2021-06-09 LAB — HEMOGLOBIN AND HEMATOCRIT, BLOOD
HCT: 33.8 % — ABNORMAL LOW (ref 36.0–46.0)
Hemoglobin: 10.4 g/dL — ABNORMAL LOW (ref 12.0–15.0)

## 2021-06-09 LAB — GLUCOSE, CAPILLARY: Glucose-Capillary: 115 mg/dL — ABNORMAL HIGH (ref 70–99)

## 2021-06-09 LAB — PREPARE RBC (CROSSMATCH)

## 2021-06-09 LAB — SURGICAL PATHOLOGY

## 2021-06-09 LAB — APTT: aPTT: 87 seconds — ABNORMAL HIGH (ref 24–36)

## 2021-06-09 MED ORDER — OXYCODONE HCL 5 MG PO TABS
5.0000 mg | ORAL_TABLET | Freq: Four times a day (QID) | ORAL | Status: DC | PRN
  Administered 2021-06-09 – 2021-06-14 (×10): 5 mg via ORAL
  Filled 2021-06-09 (×11): qty 1

## 2021-06-09 MED ORDER — MORPHINE SULFATE (PF) 2 MG/ML IV SOLN
1.0000 mg | INTRAVENOUS | Status: DC | PRN
  Administered 2021-06-09 – 2021-06-10 (×3): 1 mg via INTRAVENOUS
  Filled 2021-06-09 (×3): qty 1

## 2021-06-09 MED ORDER — METOPROLOL SUCCINATE ER 25 MG PO TB24
25.0000 mg | ORAL_TABLET | Freq: Every day | ORAL | Status: DC
  Administered 2021-06-10 – 2021-06-13 (×4): 25 mg via ORAL
  Filled 2021-06-09 (×4): qty 1

## 2021-06-09 MED ORDER — FUROSEMIDE 10 MG/ML IJ SOLN
20.0000 mg | Freq: Once | INTRAMUSCULAR | Status: AC
  Administered 2021-06-09: 20 mg via INTRAVENOUS
  Filled 2021-06-09: qty 2

## 2021-06-09 MED ORDER — SODIUM CHLORIDE 0.9% IV SOLUTION: Freq: Once | INTRAVENOUS | Status: AC

## 2021-06-09 NOTE — Consult Note (Addendum)
? ?                                                                                ?Consultation Note ?Date: 06/09/2021  ? ?Patient Name: Jenna Dudley  ?DOB: 19-Jan-1936  MRN: [id number]  Age / Sex: 86 y.o., female  ?PCP: Aderoju, Marin OlpElizabeth Oyeyemi, MD ?Referring Physician: Osvaldo ShipperKrishnan, Gokul, MD ? ?Reason for Consultation: Establishing goals of care ? ?HPI/Patient Profile: Jenna Dudley is a pleasant 86 y.o. female with medical history significant for chronic diastolic CHF, GERD, bipolar disorder, history of CVA, and right third toe wound, now presenting to the emergency department for evaluation of chest pain. ?In the hospital, she had heart rate of 114, resp irate 28, lactic acid 4.0.  CTA chest showed segmental PEs, right side opacity with a fluid level.  Also showed additional groundglass opacities, thickened esophagus wall.  And moderate to large right-sided pleural effusion.  Patient was given IV fluids, lactic acid level has dropped down to normal.  She is also started on antibiotics with Unasyn and vancomycin. ?Patient also has a left third toe osteomyelitis peripheral vascular disease.  Angioplasty and stent placement was performed on 3/13. ?Toe amputation on 3/14. ? ?Clinical Assessment and Goals of Care: ?Notes and labs reviewed.  Patient is resting in bed with staff at bedside; she is complaining of pain and there is concern for borderline low blood pressure.  Patient complains of pain and appears very uncomfortable, but is unable to articulate where her pain is.  No family at bedside. ? ?Called to speak to patient's husband.  He discusses that his wife has a long history of bipolar disorder.  He states that she has had many ECT therapies in the past.  He tells me that those were eventually stopped as they were no longer working.  He states as a consequence of stopping these treatments she has a fear of him leaving her. ? ?He tells me prior to her initial injury of the toe, patient was actually very much  alert and oriented and independent, with no signs of dementia.  ? ?We discussed her various diagnoses, prognosis, GOC, EOL wishes disposition and options. ? ?Created space and opportunity for patient  to explore thoughts and feelings regarding current medical information.  ? ?A detailed discussion was had today regarding advanced directives.  Concepts specific to code status, artifical feeding and hydration, IV antibiotics and rehospitalization were discussed.  The difference between an aggressive medical intervention path and a comfort care path was discussed.  Values and goals of care important to patient and family were attempted to be elicited. ? ?Discussed limitations of medical interventions to prolong quality of life in some situations and discussed the concept of human mortality.  Discussed suffering. ? ?He states he understands her intake is poor.  He is unsure of how he feels about a feeding tube as historically she goes through times where she will go for days without eating and then eat large amounts of food for the next few days following. ? ?He states he is not giving up hope on her, and will continue to monitor how she does.  PMT will continue to follow for  further conversations. ?  ? ?SUMMARY OF RECOMMENDATIONS   ? ?Continue current care.  Husband is unsure of his thoughts on a feeding tube if her intake continues to be poor.  He states historically she has gone days without eating, and then has a large appetite for a few days following this. ? ?PMT will continue to follow ? ?Prognosis:  ?Poor ? ? ?  ? ?Primary Diagnoses: ?Present on Admission: ? Hypokalemia ? Pulmonary emboli (HCC) ? Elevated troponin ? Bipolar 1 disorder (HCC) ? Chest pain ? Severe sepsis (HCC) ? Necrotizing pneumonia (HCC) ? PAD (peripheral artery disease) (HCC) ? ? ?I have reviewed the medical record, interviewed the patient and family, and examined the patient. The following aspects are pertinent. ? ?Past Medical History:   ?Diagnosis Date  ? Bilateral carpal tunnel syndrome 07/27/2014  ? Bipolar disorder (HCC)   ? geropsych admission to [facility] in Dec 2018  ? Chronic diastolic CHF (congestive heart failure) (HCC)   ? a. 03/2017 Echo: EF 55-60%, no rwma, Gr1 DD, mild MR; b. 06/2019 Echo: EF 55-60%, no rwma, mod LVH, GrII DD. Nl RV size/fxn. RVSP 54.12mmHg. Mildly dil LA.  ? Chronic low back pain 07/29/2014  ? Degenerative arthritis of lumbar spine 07/27/2014  ? Dementia (HCC)   ? Depression   ? Esophageal spasm 07/29/2014  ? Gastric reflux 09/17/2014  ? GERD (gastroesophageal reflux disease)   ? History of stress test   ? a. 04/2017 MV: low risk stress test.  EF 55-65%. Probable apical ant/apical, basal, and mid inflat attenuation artifact vs ischemia/scar.  ? Hyperlipemia 07/29/2014  ? Hypertension   ? Pneumonia   ? Spinal stenosis 07/29/2014  ? ?Social History  ? ?Socioeconomic History  ? Marital status: Married  ?  Spouse name: Not on file  ? Number of children: Not on file  ? Years of education: Not on file  ? Highest education level: Not on file  ?Occupational History  ?  Comment: retired  ?Tobacco Use  ? Smoking status: Former  ?  Types: Cigarettes  ?  Quit date: 07/29/1975  ?  Years since quitting: 45.8  ? Smokeless tobacco: Never  ?Vaping Use  ? Vaping Use: Never used  ?Substance and Sexual Activity  ? Alcohol use: No  ?  Alcohol/week: 0.0 standard drinks  ? Drug use: No  ? Sexual activity: Never  ?  Comment: postmenopause  ?Other Topics Concern  ? Not on file  ?Social History Narrative  ? Not on file  ? ?Social Determinants of Health  ? ?Financial Resource Strain: Not on file  ?Food Insecurity: Not on file  ?Transportation Needs: Not on file  ?Physical Activity: Not on file  ?Stress: Not on file  ?Social Connections: Not on file  ? ?Family History  ?Problem Relation Age of Onset  ? Hypertension Mother   ? Stroke Mother   ? ?Scheduled Meds: ? (feeding supplement) PROSource Plus  30 mL Oral TID BM  ? vitamin C  500 mg Oral BID  ?  clopidogrel  75 mg Oral Daily  ? feeding supplement  1 Container Oral TID BM  ? furosemide  20 mg Intravenous Once  ? mouth rinse  15 mL Mouth Rinse BID  ? megestrol  400 mg Oral Daily  ? [START ON 06/10/2021] metoprolol succinate  25 mg Oral Daily  ? multivitamin with minerals  1 tablet Oral Daily  ? pantoprazole  40 mg Oral Daily  ? simvastatin  40 mg Oral QHS  ?  sodium chloride flush  3 mL Intravenous Q12H  ? sodium chloride flush  3 mL Intravenous Q12H  ? sucralfate  1 g Oral TID WC & HS  ? zinc sulfate  220 mg Oral Daily  ? ?Continuous Infusions: ? sodium chloride Stopped (06/04/21 1324)  ? sodium chloride    ? ampicillin-sulbactam (UNASYN) IV 3 g (06/09/21 5374)  ? argatroban 1 mcg/kg/min (06/09/21 1115)  ? ?PRN Meds:.sodium chloride, sodium chloride, acetaminophen **OR** acetaminophen, albuterol, diphenhydrAMINE, morphine injection, ondansetron **OR** ondansetron (ZOFRAN) IV, ondansetron (ZOFRAN) IV, oxyCODONE, polyethylene glycol, sodium chloride flush ?Medications Prior to Admission:  ?Prior to Admission medications   ?Medication Sig Start Date End Date Taking? Authorizing Provider  ?acetaminophen (TYLENOL) 325 MG tablet Take 650 mg by mouth every 6 (six) hours.   Yes [provider]  ?acetaminophen-codeine (TYLENOL #3) 300-30 MG per tablet Take 1 tablet by mouth every 8 (eight) hours as needed for moderate pain or severe pain.   Yes [provider]  ?albuterol (VENTOLIN HFA) 108 (90 Base) MCG/ACT inhaler Inhale 2 puffs into the lungs every 6 (six) hours as needed for wheezing or shortness of breath. 04/14/21  Yes Darlin Drop, DO  ?HYDROcodone-acetaminophen (NORCO/VICODIN) 5-325 MG tablet Take 1 tablet by mouth every 6 (six) hours as needed. 05/31/21  Yes [provider]  ?Lactobacillus Acid-Pectin (ACIDOPHILUS/PECTIN) CAPS Take by mouth.   Yes [provider]  ?magnesium oxide (MAG-OX) 400 MG tablet Take 400 mg by mouth daily.   Yes [provider]  ?naloxone  (NARCAN) nasal spray 4 mg/0.1 mL SMARTSIG:1 Spray(s) Both Nares Daily PRN 07/22/20  Yes [provider]  ?polyethylene glycol (MIRALAX) 17 g packet Take 17 g by mouth daily as needed for mild constipatio

## 2021-06-09 NOTE — Progress Notes (Signed)
ANTICOAGULATION CONSULT NOTE ? ?Pharmacy Consult for argatroban ?Indication: pulmonary embolus ? ?Patient Measurements: ?Height: 5' (152.4 cm) ?Weight: 56 kg (123 lb 7.3 oz) ?IBW/kg (Calculated) : 45.5 ?Heparin Dosing Weight: 47.6 kg ? ?Labs: ?Recent Labs  ?  06/06/21 ?1604 06/07/21 ?1116 06/07/21 ?1116 06/08/21 ?0439 06/08/21 ?0753 06/08/21 ?1425 06/08/21 ?1907 06/09/21 ?0543  ?HGB  --  8.7*   < > 8.0*  --   --   --  7.1*  ?HCT  --  28.1*  --  25.8*  --   --   --  23.7*  ?PLT  --  142*  --  120*  --   --   --  94*  ?APTT  --   --   --   --   --  51* 75* 87*  ?HEPARINUNFRC  --  0.34  --   --  0.29*  --   --   --   ?CREATININE 0.78  --   --  0.97  --   --   --   --   ? < > = values in this interval not displayed.  ? ? ?Estimated Creatinine Clearance: 34.5 mL/min (by C-G formula based on SCr of 0.97 mg/dL). ? ?Assessment: ?86 year old female admitted with CP found to be a result of segmental/subsegmental PEs in upper and lower lobes. Concomitant toe amputation for OM completed 3/14 PM. ? ?Platelets had dropped from 336 (3/8) > 142 (3/14 AM) > 120 (3/14). Ultimately, platelets had decreased by > 50% from admission. History of anemia, though Hgb has remained stable at ~8. Estimated 4T score for HITT is ~5 (intermediate risk).  ? ?Baseline aPTT 31. ? ?Date/Time aPTT Comment/Rate ?0315 1425 51 1 mcg/kg/min (53 mcg/min) ?0315 1907  75  1 mcg/kg/min (53 mcg/min) ?0316 0543      87        1 mcg/kg/min (53 mcg/min) ? ?Goal of Therapy:  ?aPTT 50-90 seconds ?Monitor platelets by anticoagulation protocol: Yes ?  ?Plan:  ?3/16:  aPTT @ 0543 = 87, therapeutic X 3  ?Will continue pt on current rate and recheck HL on 3/17 with AM labs.  ? ?Krystine Pabst D, PharmD ?06/09/2021 ?6:53 AM ? ? ?

## 2021-06-09 NOTE — Op Note (Signed)
? ?OPERATIVE REPORT ?Patient name: Jenna Dudley ?MRN: [id number] ?DOB: Jun 12, 1935 ? ?DOS: 06/09/21 ? ?Preop Dx: gangrene right third toe. Osteomyelitis right third toe ?Postop Dx: same ? ?Procedure:  ?1. Amputation right third toe ?2. Bone biopsy right third metatarsal ? ?Surgeon: Felecia Shelling DPM ? ?Anesthesia: 50-50 mixture of 2% lidocaine plain with 0.5% Marcaine plain totaling 72mL. infiltrated in the patient's right foot  ? ?Hemostasis: None  ? ?EBL: 10 mL ?Materials: none ?Injectables: none ?Pathology: bone specimen right third metatarsal  ? ?Condition: The patient tolerated the procedure and anesthesia well. No complications noted or reported ? ? ?Justification for procedure: The patient is a 86 y.o. female who presents today for surgical correction of gangrene of the right third toe with osteomyelitis. All conservative modalities of been unsuccessful in providing any sort of satisfactory alleviation of symptoms with the patient. The patient was told benefits as well as possible side effects of the surgery. The patient consented for surgical correction. The patient consent form was reviewed. All patient questions were answered. No guarantees were expressed or implied. The patient and the surgeon both signed the patient consent form with the witness present and placed in the patient's chart. ? ? ?Procedure in Detail: The patient was brought to the operating room, placed in the operating table in the supine position at which time an aseptic scrub and drape were performed about the patient's respective lower extremity after anesthesia was induced as described above. Attention was then directed to the surgical area where procedure number one commenced. ? ?Procedure #1: Amputation right third toe ?Attention was directed to the right third toe where a racquet type incision was planned and made around the third MTP of the joint.  Incision was carried down to the level of joint capsule and the third toe was  distracted distally and disarticulated at the MTP joint.  Soft tissue at this level was healthy margins.  The extensor and flexor tendon was distracted distally and cut as far proximal as could be visualized.  Irrigation was utilized and the attention was directed to the third metatarsal within the amputation site where procedure 2 commenced ? ?Procedure #2: Bone biopsy right third metatarsal ?Evaluation of the third metatarsal indicated erosive changes consistent with chronic osteomyelitis of the metatarsal.  Additional soft tissue dissection was performed more proximal along the metatarsal and an osteotomy was created and the distal portion of the metatarsal was resected away and sent to pathology.  Again, copious irrigation was utilized and the amputation site was able to be primarily closed using 4-0 Prolene suture. ? ?Dry sterile compressive dressings were then applied to all previously mentioned incision sites about the patient's lower extremity.  ? ?The patient was then transferred from the operating room to the recovery room having tolerated the procedure and anesthesia well. All vital signs are stable. After a brief stay in the recovery room the patient was readmitted to inpatient room with adequate prescriptions for analgesia. Verbal as well as written instructions were provided for the patient regarding wound care. The patient is to keep the dressings clean dry and intact until they are to follow surgeon Dr. Gala Lewandowsky in the office upon discharge.  ? ?Felecia Shelling, DPM ?[facility] ? ?Dr. Felecia Shelling, DPM  ?  ?[address]                                       ?  [place], [place] [address]                ?Office [phone]  ?Fax [phone] ? ? ?

## 2021-06-09 NOTE — Progress Notes (Addendum)
Speech Language Pathology Treatment:    ?Patient Details ?Name: Jenna Dudley ?MRN: [id number] ?DOB: Feb 19, 1936 ?Today's Date: 06/09/2021 ?Time: 3474-2595 ?SLP Time Calculation (min) (ACUTE ONLY): 15 min ? ?Assessment / Plan / Recommendation ?Clinical Impression ? Pt seen for diet tolerance/trials of upgraded consistencies. Pt alert, c/o R shoulder pain (RN aware). Confusion noted. Husband at bedside. Husband hopeful for pt's diet to be upgraded with the hope that it will promote PO intake. Pt continues to have reduced PO intake on pureed diet/thin liquids. Per husband and RN report, no overt s/sx pharyngeal dysphagia with current diet. Pt's husband stated pt consuming snacks of "pecan rolls" (similar to honey bun) without difficulty.  ? ?Pt observed with a few bites of pecan roll and with thin liquids. Pt with mildly prolonged, but functional, mastication of soft solid. No overt s/sx pharyngeal dysphagia across trials. Pt's husband denies pt with dysphagia PTA.  ? ?Recommend diet upgrade to Dysphagia 3 (with finely chopped meats) and thin liquids. Safe swallowing strategies/aspiration precautions/reflux precautions as outlined below. ? ?SLP to f/u per POC for diet tolerance. Concern for poor PO intake/ability to meet nutritional needs orally. Pt would benefit from RD and Palliative Care consults (if not already following). As mentioned in previous SLP notes, may also benefit from GI consult for assessment of esophageal motility.  ? ?Pt, pt's husband, and RN made aware of results, recommendations, safe swallowing strategies/aspiration precautions, and SLP POC. RN and Husband also encouraged to contact SLP if diet upgrade is too difficult for pt. Husband verbalized understanding/agreement. Communication board in pt's room updated accordingly.  ? ? ? ?  ?HPI HPI: Per Physician's H&P: pt is a pleasant 86 y.o. female with medical history significant for Delirium w/ Dementia per chart, chronic diastolic CHF, GERD, Bipolar  Disorder, history of CVA, and right third toe wound, now presenting to the emergency department for evaluation of chest pain.  Patient reported that she has been experiencing increased pain at her right third toe for which she was taking Tylenol 3, but then developed chest pain last night.  She has difficulty providing any further information about her symptoms and tends to reply, "I do not know" to any direct questioning.  IV Ativan was administered in the emergency department but the patient was reportedly having difficulty providing history prior to that." CTA chest completed - CTA chest reveals segmental and subsegmental PE involving right upper and right lower lobes without right heart strain.  Also noted on CT chest is right-sided opacity with air-fluid level that likely reflects necrotizing pneumonia with abscess.  There are additional groundglass opacities on CT suspected to represent edema.  Also notable on imaging was patulous esophagus, fluid and debris in the right lower lobe bronchi, and pleural effusions. CT of Abd. 06/02/2021 indicated: "Diffuse increased esophageal thickening consistent with  esophagitis with increased patulous esophagus appearance. Small  hiatal hernia.".  Pt has had a previous thoracentesis at a previous admit 11/2020; and at this admit. ?  ?   ?SLP Plan ? Continue with current plan of care ? ?  ?  ?Recommendations for follow up therapy are one component of a multi-disciplinary discharge planning process, led by the attending physician.  Recommendations may be updated based on patient status, additional functional criteria and insurance authorization. ?  ? ?Recommendations  ?Diet recommendations: Dysphagia 3 (mechanical soft);Thin liquid (finely chopped meats) ?Liquids provided via: Cup;Straw ?Medication Administration: Crushed with puree ?Supervision: Patient able to self feed;Staff to assist with self feeding;Intermittent supervision to  cue for compensatory  strategies ?Compensations: Minimize environmental distractions;Slow rate;Small sips/bites;Lingual sweep for clearance of pocketing;Follow solids with liquid  ?   ?    ?   ? ? ? ? Oral Care Recommendations: Oral care BID;Oral care before and after PO;Staff/trained caregiver to provide oral care ?Follow Up Recommendations: Follow physician's recommendations for discharge plan and follow up therapies ?Assistance recommended at discharge: Frequent or constant Supervision/Assistance ?SLP Visit Diagnosis: Dysphagia, unspecified (R13.10) ?Plan: Continue with current plan of care ? ? ? ? ?  ?  ? ?Jenna Dudley, M.S., CCC-SLP ?Speech-Language Pathologist ?[facility] ?[phone])  ? ?Jenna Dudley ? ?06/09/2021, 12:47 PM ?

## 2021-06-09 NOTE — Progress Notes (Signed)
Nutrition Follow-up ? ?DOCUMENTATION CODES:  ? ?Severe malnutrition in context of chronic illness ? ?INTERVENTION:  ? ?-Continue Boost Breeze po TID, each supplement provides 250 kcal and 9 grams of protein  ?-Continue 30 ml Prosource Plus TID, each supplement provides 100 kcals and 15 grams protein ?-Continue Hormel Shake TID with meals, each supplement provides 520 kcals and 22 grams protein ?-Continue MVI with minerals daily ?-Continue 500 mg vitamin C BID ?-Continue 220 mg zinc sulfate daily x 14 days ?-Continue feeding assistance with meals ?-If feeding tube is desired, recommend: ? ?Initiate Osmolite 1.2 @ 20 ml/hr and increase by 10 ml every 12 hours to goal rate of 50 ml/hr.  ? ?105 ml free water flush every 6 hours   ? ?Tube feeding regimen provides 1440 kcal (99% of needs), 67 grams of protein, and 984 ml of H2O. Total free water: 1404 ml daily  ? ?NUTRITION DIAGNOSIS:  ? ?Severe Malnutrition related to chronic illness (CHF, CVA) as evidenced by moderate fat depletion, severe fat depletion, moderate muscle depletion, severe muscle depletion. ? ?Ongoing ? ?GOAL:  ? ?Patient will meet greater than or equal to 90% of their needs ? ?Progressing  ? ?MONITOR:  ? ?PO intake, Supplement acceptance, Diet advancement, Labs, Weight trends, Skin, I & O's ? ?REASON FOR ASSESSMENT:  ? ?Rounds ?  ? ?ASSESSMENT:  ? ?Jenna Dudley is a pleasant 86 y.o. female with medical history significant for chronic diastolic CHF, GERD, bipolar disorder, history of CVA, and right third toe wound, now presenting to the emergency department for evaluation of chest pain. ? ?3/14- megace started, s/p Procedure(s): ?AMPUTATION TOE (Right) ? ?Reviewed I/O's: +1.7 L x 24 hours and +8.8 L since admission ? ?UOP: 500 ml x 24 hours ? ?Pt sleeping soundly at time of visit. Noted breakfast tray untouched.   ? ?Noted intake continues to be erratic. Noted meal completions 0-50%. Pt taking supplements today, however, historically has been refusing.  Per palliative care notes, pt husband states that intake is variable at baseline. Husband is unsure of he wants to pursue feeding tube at this time.  ? ?Per palliative care notes, plan to continue current care. Pt with poor prognosis.  ? ?Medications reviewed and include lasix and megace.  ? ?Labs reviewed: CBGS: 109 (inpatient orders for glycemic control are none).   ? ?Diet Order:   ?Diet Order   ? ?       ?  DIET DYS 3 Room service appropriate? Yes; Fluid consistency: Thin  Diet effective now       ?  ? ?  ?  ? ?  ? ? ?EDUCATION NEEDS:  ? ?Not appropriate for education at this time ? ?Skin:  Skin Assessment: Skin Integrity Issues: ?Skin Integrity Issues:: Other (Comment) ?Other: rt third toe osteomyelitis ? ?Last BM:  06/06/21 ? ?Height:  ? ?Ht Readings from Last 1 Encounters:  ?06/07/21 5' (1.524 m)  ? ? ?Weight:  ? ?Wt Readings from Last 1 Encounters:  ?06/09/21 56 kg  ? ? ?Ideal Body Weight:  45.5 kg ? ?BMI:  Body mass index is 24.11 kg/m?. ? ?Estimated Nutritional Needs:  ? ?Kcal:  1450-1650 ? ?Protein:  60-75 grams ? ?Fluid:  > 1.4 L ? ? ? ?Levada Schilling, RD, LDN, CDCES ?Registered Dietitian II ?Certified Diabetes Care and Education Specialist ?Please refer to [facility] for RD and/or RD on-call/weekend/after hours pager  ?

## 2021-06-09 NOTE — Progress Notes (Signed)
? ?[facility] ?PROGRESS NOTE ? ? ?Jenna Dudley FGH:[id number] DOB: 05/02/1935 DOA: 06/01/2021  7 ?DOS: the patient was seen and examined on 06/09/2021 ? ?PCP: Aderoju, Jadene Pierini, MD ? ?Brief History and Hospital Course:  ?Jenna Dudley is a pleasant 86 y.o. female with medical history significant for chronic diastolic CHF, GERD, bipolar disorder, history of CVA, and right third toe wound, now presenting to the emergency department for evaluation of chest pain. ?In the hospital, she had heart rate of 114, resp irate 28, lactic acid 4.0.  CTA chest showed segmental PEs, right side opacity with a fluid level.  Also showed additional groundglass opacities, thickened esophagus wall.  And moderate to large right-sided pleural effusion.  Patient was given IV fluids, lactic acid level has dropped down to normal.  She is also started on antibiotics with Unasyn and vancomycin. ?Patient also has a left third toe osteomyelitis peripheral vascular disease.  Angioplasty and stent placement was performed on 3/13. ?Toe amputation on 3/14.  Noted to be quite lethargic today with drop in hemoglobin without any overt bleeding. ? ?Consultants: Podiatry.  Vascular surgery.  Pulmonology ? ?Procedures:  ? ?Toe amputation on the right, 3/14. ? ?3/13 ?Aortogram and selective right lower extremity angiogram ?Percutaneous transluminal angioplasty of right peroneal artery and tibioperoneal trunk with 3 mm diameter angioplasty balloon ?Percutaneous transluminal angioplasty of right distal SFA and popliteal arteries with 5 mm diameter by 30 cm length Lutonix drug-coated angioplasty balloon ?Viabahn stent placement to the distal right SFA and popliteal arteries with 6 mm diameter by 25 cm length stent for greater than 50% stenosis after angioplasty ? ? ?Subjective: ?Patient noted to be very somnolent this morning and lethargic.  She was arousable but went back to sleep.   ? ? ?Assessment/Plan: ? ? ?* Aspiration pneumonia  ([place]) ?Respiratory status is stable.  Patient seen by speech therapy.  On dysphagia 1 diet.  Remains on Unasyn.  Will end on 06/11/2021 ? ?Pleural effusion on right ?Status post thoracentesis with removing 1.2 L.  Lab study showed 79% neutrophils.  Cultures are negative. ?Glucose high, protein low.  LDH 61.  Appears to be transudate. ? ?Necrotizing pneumonia ([place]) ?Patient seen by pulmonology.  Continue on antibiotics.  Respiratory status is stable for the most part.   ?Blood culture: 1/4 bottle blood culture positive for Corynebacterium.  This is a contaminant. ? ?Severe sepsis ([place]) ?Patient met severe sepsis criteria with tachycardia and tachypnea.  Lactic acid level of 4.0.  This is secondary to pneumonia and toe osteomyelitis. ?Patient is now clinically stable. ? ?Toe osteomyelitis, right ([place]) ?Patient has been evaluated by podiatry,  duplex ultrasound showed peripheral arterial disease, MRI showed third toe osteomyelitis.  Patient has had vascular intervention with angioplasty, stent placement on 3/13.  ?Underwent right toe amputation on 3/14. ? ?PAD (peripheral artery disease) ([place]) ?Duplex ultrasound showed peripheral arterial disease, MRI showed third toe osteomyelitis.  Patient has had vascular intervention with angioplasty, stent placement on 3/13.  ?Patient remains on Plavix and statin. ? ?Pulmonary emboli ([place]) ?Remains on IV heparin.  Platelet counts noted to drop significantly.  More than 50% decrease.  See below.  Switched over to argatroban.   ? ?Thrombocytopenia ([place]) ?Significant drop in platelet count noted.  More than 50% drop.  ?Discussed with pharmacy.  HIT antibodies ordered.  Patient was transitioned over to argatroban.  Continue to monitor.   ? ?Anorexia ?Continues to have very poor appetite.  Started on megestrol.  Patient told importance  of eating and drinking.  Continue with nutritional supplements.   ? ?Anemia of chronic disease ?Hemoglobin has been low for the last several days but no  significant drop noted today.  No overt bleeding noted.  She will be transfused 1 unit of PRBC.  Discussed with patient's husband who agrees. ?Was also given B12 injections during this hospital stay.  B12 level was 377.   ? ?Bipolar 1 disorder ([place]) ?Noted to be on Seroquel.  Noted to be slightly lethargic today.  Will hold Seroquel for now. ? ?Delirium with dementia ?Noted to be lethargic today. ? ?Protein-calorie malnutrition, severe ?Continue nutritional supplements.  On megestrol to stimulate her appetite.   ? ?Chest pain ?Secondary to PE.  None currently. ? ?Elevated troponin ?Peak troponin was 108, dropped down to 94.  This is secondary to sepsis and PE. ? ?Hypokalemia ?Repleted. ? ? ? ?DVT Prophylaxis: Argatroban ?Code Status: Full code ?Family Communication: No family at bedside.  Discussed with husband over the phone. ?Disposition Plan: Skilled nursing facility is recommended ? ?Status is: Inpatient ?Remains inpatient appropriate because: Osteomyelitis right toe, peripheral artery disease, PE, thrombocytopenia ? ? ? ? ?Medications: Scheduled: ? (feeding supplement) PROSource Plus  30 mL Oral TID BM  ? sodium chloride   Intravenous Once  ? vitamin C  500 mg Oral BID  ? clopidogrel  75 mg Oral Daily  ? feeding supplement  1 Container Oral TID BM  ? mouth rinse  15 mL Mouth Rinse BID  ? megestrol  400 mg Oral Daily  ? [START ON 06/10/2021] metoprolol succinate  25 mg Oral Daily  ? multivitamin with minerals  1 tablet Oral Daily  ? pantoprazole  40 mg Oral Daily  ? QUEtiapine  25 mg Oral QHS  ? simvastatin  40 mg Oral QHS  ? sodium chloride flush  3 mL Intravenous Q12H  ? sodium chloride flush  3 mL Intravenous Q12H  ? sucralfate  1 g Oral TID WC & HS  ? zinc sulfate  220 mg Oral Daily  ? ?Continuous: ? sodium chloride Stopped (06/04/21 1324)  ? sodium chloride    ? ampicillin-sulbactam (UNASYN) IV 3 g (06/09/21 1287)  ? argatroban 1 mcg/kg/min (06/09/21 1115)  ? ?OMV:EHMCNO chloride, sodium chloride,  acetaminophen **OR** acetaminophen, albuterol, diphenhydrAMINE, morphine injection, ondansetron **OR** ondansetron (ZOFRAN) IV, ondansetron (ZOFRAN) IV, polyethylene glycol, sodium chloride flush ? ?Antibiotics: ?Anti-infectives (From admission, onward)  ? ? Start     Dose/Rate Route Frequency Ordered Stop  ? 06/07/21 0900  Ampicillin-Sulbactam (UNASYN) 3 g in sodium chloride 0.9 % 100 mL IVPB       ? 3 g ?200 mL/hr over 30 Minutes Intravenous Every 6 hours 06/07/21 0812 06/12/21 0859  ? 06/06/21 1300  ceFAZolin (ANCEF) IVPB 2g/100 mL premix       ? 2 g ?200 mL/hr over 30 Minutes Intravenous  Once 06/06/21 1249 06/06/21 1354  ? 06/04/21 2100  vancomycin (VANCOCIN) IVPB 1000 mg/200 mL premix       ? 1,000 mg ?200 mL/hr over 60 Minutes Intravenous Every 36 hours 06/04/21 1104 06/08/21 0730  ? 06/03/21 0900  vancomycin (VANCOREADY) IVPB 1250 mg/250 mL  Status:  Discontinued       ? 1,250 mg ?166.7 mL/hr over 90 Minutes Intravenous Every 36 hours 06/02/21 0538 06/02/21 0700  ? 06/03/21 0900  vancomycin (VANCOREADY) IVPB 1250 mg/250 mL  Status:  Discontinued       ? 1,250 mg ?166.7 mL/hr over 90 Minutes Intravenous Every  36 hours 06/02/21 0700 06/04/21 1104  ? 06/02/21 0800  Ampicillin-Sulbactam (UNASYN) 3 g in sodium chloride 0.9 % 100 mL IVPB       ? 3 g ?200 mL/hr over 30 Minutes Intravenous Every 8 hours 06/02/21 0527 06/07/21 0759  ? 06/02/21 0100  cefTRIAXone (ROCEPHIN) 2 g in sodium chloride 0.9 % 100 mL IVPB  Status:  Discontinued       ? 2 g ?200 mL/hr over 30 Minutes Intravenous Every 24 hours 06/02/21 0048 06/02/21 0440  ? 06/02/21 0100  azithromycin (ZITHROMAX) 500 mg in sodium chloride 0.9 % 250 mL IVPB  Status:  Discontinued       ? 500 mg ?250 mL/hr over 60 Minutes Intravenous Every 24 hours 06/02/21 0048 06/02/21 0440  ? 06/02/21 0100  vancomycin (VANCOCIN) IVPB 1000 mg/200 mL premix       ? 1,000 mg ?200 mL/hr over 60 Minutes Intravenous  Once 06/02/21 0049 06/02/21 0319  ? 06/02/21 0030  cefTRIAXone  (ROCEPHIN) 1 g in sodium chloride 0.9 % 100 mL IVPB  Status:  Discontinued       ? 1 g ?200 mL/hr over 30 Minutes Intravenous  Once 06/02/21 0026 06/02/21 0049  ? 06/02/21 0030  azithromycin (ZITHROMAX) 500 mg in sodium chlor

## 2021-06-09 NOTE — Progress Notes (Signed)
?  Subjective:  ?Patient ID: Jenna Dudley, female    DOB: Aug 11, 1935,  MRN: [id number] ? ?A 86 y.o. female presents with right third digit osteomyelitis status post partial third ray amputation.  She states that she is doing okay.  She still has some pain in the foot.  She is here with her husband today at bedside.  He states that she is feeling better.  She denies any other acute complaints. ? ?Objective:  ? ?Vitals:  ? 06/09/21 0900 06/09/21 1138  ?BP: 137/60 (!) 115/54  ?Pulse: 85 78  ?Resp: 17 19  ?Temp: 97.8 ?F (36.6 ?C) 97.8 ?F (36.6 ?C)  ?SpO2: 100% 100%  ? ?General AA&O x3. Normal mood and affect.  ?Vascular Dorsalis pedis and posterior tibial pulses 2/4 bilat. ?Brisk capillary refill to all digits. Pedal hair present.  ?Neurologic Epicritic sensation grossly intact.  ?Dermatologic No clinical signs of infection noted.  The stitching is intact.  No signs of dehiscence noted.  No redness or cellulitis noted.  No purulent drainage noted  ?Orthopedic: MMT 5/5 in dorsiflexion, plantarflexion, inversion, and eversion. ?Normal joint ROM without pain or crepitus.  ? ? ?Assessment & Plan:  ?Patient was evaluated and treated and all questions answered. ? ?Right third digit osteomyelitis status post partial third ray amputation primarily closed ?-All questions and concerns were discussed with the patient in extensive detail. ?-She is okay to be discharged from podiatric standpoint. ?-She will follow-up with Dr. Amalia Hailey 1 week from discharge. ?-Awaiting pathology.  She can patient discharged on 14 days of doxycycline. ?-No dressing change until follow-up ?-Partial weightbearing to the heel with a surgical shoe. ? ?Felipa Furnace, DPM ? ?Accessible via secure chat for questions or concerns. ? ?

## 2021-06-10 DIAGNOSIS — Z7189 Other specified counseling: Secondary | ICD-10-CM | POA: Diagnosis not present

## 2021-06-10 DIAGNOSIS — I2699 Other pulmonary embolism without acute cor pulmonale: Secondary | ICD-10-CM | POA: Diagnosis not present

## 2021-06-10 DIAGNOSIS — J690 Pneumonitis due to inhalation of food and vomit: Secondary | ICD-10-CM | POA: Diagnosis not present

## 2021-06-10 DIAGNOSIS — D696 Thrombocytopenia, unspecified: Secondary | ICD-10-CM | POA: Diagnosis not present

## 2021-06-10 DIAGNOSIS — M869 Osteomyelitis, unspecified: Secondary | ICD-10-CM | POA: Diagnosis not present

## 2021-06-10 LAB — COMPREHENSIVE METABOLIC PANEL
ALT: 10 U/L (ref 0–44)
AST: 16 U/L (ref 15–41)
Albumin: 2.5 g/dL — ABNORMAL LOW (ref 3.5–5.0)
Alkaline Phosphatase: 34 U/L — ABNORMAL LOW (ref 38–126)
Anion gap: 9 (ref 5–15)
BUN: 14 mg/dL (ref 8–23)
CO2: 26 mmol/L (ref 22–32)
Calcium: 8.3 mg/dL — ABNORMAL LOW (ref 8.9–10.3)
Chloride: 106 mmol/L (ref 98–111)
Creatinine, Ser: 0.75 mg/dL (ref 0.44–1.00)
GFR, Estimated: >60 mL/min (ref >60)
Glucose, Bld: 96 mg/dL (ref 70–99)
Potassium: 3.5 mmol/L (ref 3.5–5.1)
Sodium: 141 mmol/L (ref 135–145)
Total Bilirubin: 1 mg/dL (ref 0.3–1.2)
Total Protein: 6.2 g/dL — ABNORMAL LOW (ref 6.5–8.1)

## 2021-06-10 LAB — TYPE AND SCREEN
ABO/RH(D): AB POS
Antibody Screen: NEGATIVE
Unit division: 0

## 2021-06-10 LAB — CBC
HCT: 28 % — ABNORMAL LOW (ref 36.0–46.0)
Hemoglobin: 8.8 g/dL — ABNORMAL LOW (ref 12.0–15.0)
MCH: 33.1 pg (ref 26.0–34.0)
MCHC: 31.4 g/dL (ref 30.0–36.0)
MCV: 105.3 fL — ABNORMAL HIGH (ref 80.0–100.0)
Platelets: 135 10*3/uL — ABNORMAL LOW (ref 150–400)
RBC: 2.66 MIL/uL — ABNORMAL LOW (ref 3.87–5.11)
RDW: 23.6 % — ABNORMAL HIGH (ref 11.5–15.5)
WBC: 6.3 10*3/uL (ref 4.0–10.5)
nRBC: 0 % (ref 0.0–0.2)

## 2021-06-10 LAB — HEPARIN INDUCED PLATELET AB (HIT ANTIBODY): Heparin Induced Plt Ab: 0.823 OD — ABNORMAL HIGH (ref 0.000–0.400)

## 2021-06-10 LAB — GLUCOSE, CAPILLARY
Glucose-Capillary: 92 mg/dL (ref 70–99)
Glucose-Capillary: 104 mg/dL — ABNORMAL HIGH (ref 70–99)

## 2021-06-10 LAB — BPAM RBC
Blood Product Expiration Date: 202304132359
ISSUE DATE / TIME: 202303161258
Unit Type and Rh: 6200

## 2021-06-10 LAB — APTT: aPTT: 76 seconds — ABNORMAL HIGH (ref 24–36)

## 2021-06-10 MED ORDER — POTASSIUM CHLORIDE 10 MEQ/100ML IV SOLN
10.0000 meq | INTRAVENOUS | Status: AC
  Administered 2021-06-10 (×4): 10 meq via INTRAVENOUS
  Filled 2021-06-10 (×4): qty 100

## 2021-06-10 MED ORDER — HYDROMORPHONE HCL 1 MG/ML IJ SOLN: 0.5000 mg | Freq: Once | INTRAMUSCULAR | Status: DC

## 2021-06-10 MED ORDER — DOXYCYCLINE HYCLATE 100 MG PO TABS
100.0000 mg | ORAL_TABLET | Freq: Two times a day (BID) | ORAL | Status: DC
  Administered 2021-06-10 – 2021-06-14 (×9): 100 mg via ORAL
  Filled 2021-06-10 (×9): qty 1

## 2021-06-10 MED ORDER — MORPHINE SULFATE (PF) 2 MG/ML IV SOLN
0.5000 mg | INTRAVENOUS | Status: DC | PRN
  Administered 2021-06-11 (×3): 0.5 mg via INTRAVENOUS
  Filled 2021-06-10 (×3): qty 1

## 2021-06-10 NOTE — Progress Notes (Signed)
Cross Cover ?One dose additional pain med, dilaudid 0.5 mg ordered for severe unrelieved pain. Effective for relief ?Nurse now reporting asymptomatic 13 beat run vtach, asymptomatic and pain relieved. Continue to monitor. Keep potassium goal above 4 and mag above 2 ? ?

## 2021-06-10 NOTE — Progress Notes (Signed)
ANTICOAGULATION CONSULT NOTE ? ?Pharmacy Consult for argatroban ?Indication: pulmonary embolus ? ?Patient Measurements: ?Height: 5' (152.4 cm) ?Weight: 57.1 kg (125 lb 14.1 oz) ?IBW/kg (Calculated) : 45.5 ?Heparin Dosing Weight: 47.6 kg ? ?Labs: ?Recent Labs  ?  06/07/21 ?1116 06/08/21 ?0439 06/08/21 ?0753 06/08/21 ?1425 06/08/21 ?1907 06/09/21 ?2248 06/09/21 ?1832 06/10/21 ?0418  ?HGB 8.7* 8.0*  --   --   --  7.1* 10.4* 8.8*  ?HCT 28.1* 25.8*  --   --   --  23.7* 33.8* 28.0*  ?PLT 142* 120*  --   --   --  94*  --  135*  ?APTT  --   --   --    < > 75* 87*  --  76*  ?HEPARINUNFRC 0.34  --  0.29*  --   --   --   --   --   ?CREATININE  --  0.97  --   --   --   --   --   --   ? < > = values in this interval not displayed.  ? ? ?Estimated Creatinine Clearance: 34.8 mL/min (by C-G formula based on SCr of 0.97 mg/dL). ? ?Assessment: ?86 year old female admitted with CP found to be a result of segmental/subsegmental PEs in upper and lower lobes. Concomitant toe amputation for OM completed 3/14 PM. ? ?Platelets had dropped from 336 (3/8) > 142 (3/14 AM) > 120 (3/14). Ultimately, platelets had decreased by > 50% from admission. History of anemia, though Hgb has remained stable at ~8. Estimated 4T score for HITT is ~5 (intermediate risk).  ? ?Baseline aPTT 31. ? ?Date/Time aPTT Comment/Rate ?0315 1425 51 1 mcg/kg/min (53 mcg/min) ?0315 1907  75  1 mcg/kg/min (53 mcg/min) ?0316 0543      87        1 mcg/kg/min (53 mcg/min) ?0317 0418      76        1 mcg/kg/min (53 mcg/min) ? ?Goal of Therapy:  ?aPTT 50-90 seconds ?Monitor platelets by anticoagulation protocol: Yes ?  ?Plan:  ?3/17:  aPTT = 76, therapeutic X 4 ?Will continue pt on current rate and recheck aPTT on 3/18 with AM labs.   ? ?Kaesha Kirsch D, PharmD ?06/10/2021 ?5:13 AM ? ? ?

## 2021-06-10 NOTE — NC FL2 (Signed)
?[place] MEDICAID FL2 LEVEL OF CARE SCREENING TOOL  ?  ? ?IDENTIFICATION  ?Patient Name: ?Jenna Dudley Birthdate: 06/08/1935 Sex: female Admission Date (Current Location): ?06/01/2021  ?[place] and [place] Number: ? [place] ?  Facility and Address:  ?[facility], [address], [place], [place] [address] ?     Provider Number: ?[id number]  ?Attending Physician Name and Address:  ?Bonnielee Haff, MD ? Relative Name and Phone Number:  ?  ?   ?Current Level of Care: ?Hospital Recommended Level of Care: ?[place] Prior Approval Number: ?  ? ?Date Approved/Denied: ?  PASRR Number: ?Manual review ? ?Discharge Plan: ?SNF ?  ? ?Current Diagnoses: ?Patient Active Problem List  ? Diagnosis Date Noted  ? Thrombocytopenia ([place]) 06/08/2021  ? PAD (peripheral artery disease) ([place]) 06/08/2021  ? Protein-calorie malnutrition, severe 06/07/2021  ? Anorexia 06/04/2021  ? Aspiration pneumonia ([place]) 06/02/2021  ? Pulmonary emboli ([place]) 06/02/2021  ? Elevated troponin 06/02/2021  ? Chest pain 06/02/2021  ? Hypomagnesemia 06/02/2021  ? Pleural effusion on right 06/02/2021  ? Anemia of chronic disease 06/02/2021  ? Necrotizing pneumonia ([place])   ? Sepsis ([place])   ? COVID-19 virus infection 04/11/2021  ? Toe osteomyelitis, right ([place]) 04/10/2021  ? Elevated lactic acid level 04/10/2021  ? Hypokalemia 04/10/2021  ? CKD (chronic kidney disease), stage IIIa 04/10/2021  ? Iron deficiency anemia 04/10/2021  ? CHF exacerbation ([place]) 12/10/2020  ? Delirium with dementia   ? HCAP (healthcare-associated pneumonia) 12/18/2017  ? Bipolar 1 disorder ([place]) 05/29/2017  ? CHF (congestive heart failure) ([place]) 04/23/2017  ? Accelerated hypertension 04/21/2017  ? Severe sepsis ([place]) 10/03/2015  ? Hypernatremia 10/03/2015  ? Lactic acidosis 10/03/2015  ? Leukocytosis 10/03/2015  ? Agitation 10/03/2015  ? Severe recurrent major depression without psychotic features ([place])   ? Major depressive disorder, recurrent  episode, severe, with psychotic behavior ([place]) 10/13/2014  ? Severe episode of recurrent major depressive disorder, with psychotic features ([place]) 10/13/2014  ? Major depressive disorder, 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 features ([place]) 10/06/2014  ? Essential hypertension 09/17/2014  ? Chronic back pain 09/17/2014  ? History of stroke 09/17/2014  ? Severe recurrent major depression with psychotic features ([place])   ? Acute on chronic diastolic CHF (congestive heart failure) ([place]) 07/29/2014  ? Partial small bowel obstruction ([place]) 07/29/2014  ? Hallux abductovalgus with bunions 09/12/2013  ? Hammer toe 09/12/2013  ? Foot pain 09/12/2013  ? Fungal infection of nail 09/12/2013  ? Carpal tunnel syndrome 12/21/2010  ? Chronic low back pain 12/21/2010  ? Degenerative arthritis of lumbar spine 12/21/2010  ? Depression 12/21/2010  ? 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 HLD (hyperlipidemia) 12/21/2010  ? Acid reflux 12/21/2010  ? Spinal stenosis 12/21/2010  ? ? ?Orientation RESPIRATION BLADDER Height & Weight   ?  ?Self, Place ? O2 (Nasal cannula 4 L) Incontinent, External catheter Weight: 125 lb 14.1 oz (57.1 kg) ?Height:  5' (152.4 cm)  ?BEHAVIORAL SYMPTOMS/MOOD NEUROLOGICAL BOWEL NUTRITION STATUS  ? (None)  (Dementia, History of stroke) Incontinent Diet (DYS 3. Finely chopped meats   Extra gravies, sauces, condiments)  ?AMBULATORY STATUS COMMUNICATION OF NEEDS Skin   ?Extensive Assist Verbally Bruising, Surgical wounds, Other (Comment) (Incision on right foot: Compression wrap, impregnated gauze (bismuth). Non-pressure wound on right anterior toe (nonadherent).) ?  ?  ?  ?    ?     ?     ? ? ?Personal Care Assistance Level of Assistance  ?Bathing, Feeding, Dressing Bathing Assistance:  Maximum assistance ?Feeding assistance: Limited assistance ?Dressing Assistance: Maximum assistance ?   ? ?Functional Limitations Info  ?Sight, Hearing, Speech Sight Info: Adequate ?Hearing Info: Adequate ?Speech Info: Adequate   ? ? ?SPECIAL CARE FACTORS FREQUENCY  ?PT (By licensed PT), OT (By licensed OT), Speech therapy   ?  ?PT Frequency: 5 x week ?OT Frequency: 5 x week ?  ?  ?Speech Therapy Frequency: 5 x week ?   ? ? ?Contractures Contractures Info: Not present  ? ? ?Additional Factors Info  ?Code Status, Allergies, Psychotropic Code Status Info: Full code ?Allergies Info: Aspirin, Sulfa antibiotics, Heparin ?Psychotropic Info: Bipolar Disorder ?  ?  ?   ? ?Current Medications (06/10/2021):  This is the current hospital active medication list ?Current Facility-Administered Medications  ?Medication Dose Route Frequency Provider Last Rate Last Admin  ? (feeding supplement) PROSource Plus liquid 30 mL  30 mL Oral TID BM Sharen Hones, MD   30 mL at 06/10/21 1303  ? 0.9 %  sodium chloride infusion   Intravenous PRN Algernon Huxley, MD   Paused at 06/04/21 1324  ? 0.9 %  sodium chloride infusion  250 mL Intravenous PRN Bonnielee Haff, MD      ? acetaminophen (TYLENOL) tablet 650 mg  650 mg Oral Q6H PRN Algernon Huxley, MD   650 mg at 06/09/21 2354  ? Or  ? acetaminophen (TYLENOL) suppository 650 mg  650 mg Rectal Q6H PRN Algernon Huxley, MD      ? albuterol (PROVENTIL) (2.5 MG/3ML) 0.083% nebulizer solution 2.5 mg  2.5 mg Nebulization Q6H PRN Algernon Huxley, MD   2.5 mg at 06/08/21 1359  ? argatroban 1 mg/mL infusion  1 mcg/kg/min Intravenous Continuous Bonnielee Haff, MD 3.18 mL/hr at 06/10/21 1300 1 mcg/kg/min at 06/10/21 1300  ? ascorbic acid (VITAMIN C) tablet 500 mg  500 mg Oral BID Sharen Hones, MD   500 mg at 06/10/21 0931  ? clopidogrel (PLAVIX) tablet 75 mg  75 mg Oral Daily Algernon Huxley, MD   75 mg at 06/10/21 0931  ? diphenhydrAMINE (BENADRYL) injection 12.5 mg  12.5 mg Intravenous Q8H PRN Schnier, Dolores Lory, MD   12.5 mg at 06/09/21 2156  ? doxycycline (VIBRA-TABS) tablet 100 mg  100 mg Oral Q12H Bonnielee Haff, MD   100 mg at 06/10/21 1223  ? feeding supplement (BOOST / RESOURCE BREEZE) liquid 1 Container  1 Container Oral TID BM Algernon Huxley, MD   1 Container at 06/10/21 1303  ? HYDROmorphone (DILAUDID) injection 0.5 mg  0.5 mg Intravenous Once Sharion Settler, NP      ? MEDLINE mouth rinse  15 mL Mouth Rinse BID Algernon Huxley, MD   15 mL at 06/10/21 0934  ? megestrol (MEGACE) 400 MG/10ML suspension 400 mg  400 mg Oral Daily Algernon Huxley, MD   400 mg at 06/10/21 0935  ? metoprolol succinate (TOPROL-XL) 24 hr tablet 25 mg  25 mg Oral Daily Bonnielee Haff, MD      ? morphine (PF) 2 MG/ML injection 1 mg  1 mg Intravenous Q4H PRN Bonnielee Haff, MD   1 mg at 06/10/21 1300  ? multivitamin with minerals tablet 1 tablet  1 tablet Oral Daily Algernon Huxley, MD   1 tablet at 06/10/21 0931  ? ondansetron (ZOFRAN) tablet 4 mg  4 mg Oral Q6H PRN Algernon Huxley, MD   4 mg at 06/05/21 1412  ? Or  ? ondansetron (ZOFRAN) injection  4 mg  4 mg Intravenous Q6H PRN Algernon Huxley, MD   4 mg at 06/04/21 2148  ? ondansetron (ZOFRAN) injection 4 mg  4 mg Intravenous Q6H PRN Algernon Huxley, MD      ? oxyCODONE (Oxy IR/ROXICODONE) immediate release tablet 5 mg  5 mg Oral Q6H PRN Bonnielee Haff, MD   5 mg at 06/10/21 1223  ? pantoprazole (PROTONIX) EC tablet 40 mg  40 mg Oral Daily Algernon Huxley, MD   40 mg at 06/10/21 0930  ? polyethylene glycol (MIRALAX / GLYCOLAX) packet 17 g  17 g Oral Daily PRN Algernon Huxley, MD      ? potassium chloride 10 mEq in 100 mL IVPB  10 mEq Intravenous Q1 Hr x 4 Bonnielee Haff, MD 100 mL/hr at 06/10/21 1331 10 mEq at 06/10/21 1331  ? simvastatin (ZOCOR) tablet 40 mg  40 mg Oral QHS Algernon Huxley, MD   40 mg at 06/09/21 2123  ? sodium chloride flush (NS) 0.9 % injection 3 mL  3 mL Intravenous Q12H Algernon Huxley, MD   3 mL at 06/09/21 2129  ? sodium chloride flush (NS) 0.9 % injection 3 mL  3 mL Intravenous Q12H Bonnielee Haff, MD   3 mL at 06/09/21 2129  ? sodium chloride flush (NS) 0.9 % injection 3 mL  3 mL Intravenous PRN Bonnielee Haff, MD      ? sucralfate (CARAFATE) tablet 1 g  1 g Oral TID WC & HS Algernon Huxley, MD   1 g at 06/10/21 1223  ?  zinc sulfate capsule 220 mg  220 mg Oral Daily Sharen Hones, MD   220 mg at 06/10/21 [id number]  ? ? ? ?Discharge Medications: ?Please see discharge summary for a list of discharge medications. ? ?Relevant Imaging Results:

## 2021-06-10 NOTE — Progress Notes (Addendum)
? ?                                                                                                                                                     ?                                                   ?Daily Progress Note  ? ?Patient Name: Jenna Dudley       Date: 06/10/2021 ?DOB: 11-Sep-1935  Age: 86 y.o. MRN#: [id number] ?Attending Physician: Bonnielee Haff, MD ?Primary Care Physician: Jovita Kussmaul, MD ?Admit Date: 06/01/2021 ? ?Reason for Consultation/Follow-up: Establishing goals of care ? ?Subjective: ?Reviewed notes and labs. In to bedside and patient is sleeping soundly.  Even and unlabored respirations. No family at bedside.  Spoke with nurse, he discusses the high level of pain that she has been in.  He states he gave her oxycodone and then followed that by morphine as patient's husband was concerned with her level of pain. ? ?Stepped out to call husband.  He states that she ate and drink better for him today, and he is encouraged by this.  We discussed her pain.  He states that she has had an increased level of pain.  Discussed  pain management.  Discussed side effects of narcotic medication.  Discussed quality versus quantity of life and his thoughts.  ? ?Following long conversation, he states his daughters will be coming into town this weekend so they can talk further about care moving forward.  He does understand her status but wants his children's involvement, and does not want to make a hasty decision.  Inquired about limits on care.  He states there are no limits at this time.  He states in regards to her being more sleepy "she has always stated that if she is asleep she does not know the difference".  Inquired if this would apply to indefinite sleep; he is clear that she would not want to live in a vegetative state.  I will follow-up on Tuesday on my return to discuss further goals of care. ? ?Length of Stay: 8 ? ?Current Medications: ?Scheduled Meds:  ? (feeding supplement) PROSource  Plus  30 mL Oral TID BM  ? vitamin C  500 mg Oral BID  ? clopidogrel  75 mg Oral Daily  ? doxycycline  100 mg Oral Q12H  ? feeding supplement  1 Container Oral TID BM  ?  HYDROmorphone (DILAUDID) injection  0.5 mg Intravenous Once  ? mouth rinse  15 mL Mouth Rinse BID  ? megestrol  400 mg Oral Daily  ? metoprolol succinate  25 mg Oral Daily  ? multivitamin with minerals  1 tablet Oral Daily  ? pantoprazole  40 mg Oral Daily  ? simvastatin  40 mg Oral QHS  ? sodium chloride flush  3 mL Intravenous Q12H  ? sodium chloride flush  3 mL Intravenous Q12H  ? sucralfate  1 g Oral TID WC & HS  ? zinc sulfate  220 mg Oral Daily  ? ? ?Continuous Infusions: ? sodium chloride Stopped (06/04/21 1324)  ? sodium chloride    ? argatroban 1 mcg/kg/min (06/10/21 1300)  ? ? ?PRN Meds: ?sodium chloride, sodium chloride, acetaminophen **OR** acetaminophen, albuterol, diphenhydrAMINE, morphine injection, ondansetron **OR** ondansetron (ZOFRAN) IV, ondansetron (ZOFRAN) IV, oxyCODONE, polyethylene glycol, sodium chloride flush ? ?Physical Exam ?Constitutional:   ?   Comments: Eyes closed.  ?Pulmonary:  ?   Effort: Pulmonary effort is normal.  ?   Comments: Even and unlabored respirations. ?         ? ?Vital Signs: BP (!) 140/96 (BP Location: Right Arm)   Pulse 84   Temp 98.1 ?F (36.7 ?C)   Resp 18   Ht 5' (1.524 m)   Wt 57.1 kg   LMP  (LMP Unknown)   SpO2 93%   BMI 24.58 kg/m?  ?SpO2: SpO2: 93 % ?O2 Device: O2 Device: Nasal Cannula ?O2 Flow Rate: O2 Flow Rate (L/min): 4 L/min ? ?Intake/output summary:  ?Intake/Output Summary (Last 24 hours) at 06/10/2021 1459 ?Last data filed at 06/10/2021 1300 ?Gross per 24 hour  ?Intake 1781.05 ml  ?Output 951 ml  ?Net 830.05 ml  ? ?LBM: Last BM Date : 06/09/21 ?Baseline Weight: Weight: 51.6 kg ?Most recent weight: Weight: 57.1 kg ? ? ? ? ?Patient Active Problem List  ? Diagnosis Date Noted  ? Thrombocytopenia ([place]) 06/08/2021  ? PAD (peripheral artery disease) ([place]) 06/08/2021  ? Protein-calorie  malnutrition, severe 06/07/2021  ? Anorexia 06/04/2021  ? Aspiration pneumonia ([place]) 06/02/2021  ? Pulmonary emboli ([place]) 06/02/2021  ? Elevated troponin 06/02/2021  ? Chest pain 06/02/2021  ? Hypomagnesemia 06/02/2021  ? Pleural effusion on right 06/02/2021  ? Anemia of chronic disease 06/02/2021  ? Necrotizing pneumonia ([place])   ? Sepsis ([place])   ? COVID-19 virus infection 04/11/2021  ? Toe osteomyelitis, right ([place]) 04/10/2021  ? Elevated lactic acid level 04/10/2021  ? Hypokalemia 04/10/2021  ? CKD (chronic kidney disease), stage IIIa 04/10/2021  ? Iron deficiency anemia 04/10/2021  ? CHF exacerbation ([place]) 12/10/2020  ? Delirium with dementia   ? HCAP (healthcare-associated pneumonia) 12/18/2017  ? Bipolar 1 disorder ([place]) 05/29/2017  ? CHF (congestive heart failure) ([place]) 04/23/2017  ? Accelerated hypertension 04/21/2017  ? Severe sepsis ([place]) 10/03/2015  ? Hypernatremia 10/03/2015  ? Lactic acidosis 10/03/2015  ? Leukocytosis 10/03/2015  ? Agitation 10/03/2015  ? Severe recurrent major depression without psychotic features ([place])   ? Major depressive disorder, recurrent episode, severe, with psychotic behavior ([place]) 10/13/2014  ? Severe episode of recurrent major depressive disorder, with psychotic features ([place]) 10/13/2014  ? Major depressive disorder, 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 features ([place]) 10/06/2014  ? Essential hypertension 09/17/2014  ? Chronic back pain 09/17/2014  ? History of stroke 09/17/2014  ? Severe recurrent major depression with psychotic features ([place])   ? Acute on chronic diastolic CHF (congestive heart failure) ([place]) 07/29/2014  ? Partial small bowel obstruction ([place]) 07/29/2014  ? Hallux abductovalgus with bunions 09/12/2013  ? Hammer toe 09/12/2013  ? Foot pain 09/12/2013  ? Fungal infection of nail 09/12/2013  ? Carpal tunnel syndrome 12/21/2010  ? Chronic low back  pain 12/21/2010  ? Degenerative arthritis of lumbar spine 12/21/2010  ? Depression 12/21/2010  ? Barsony-Polgar  syndrome 12/21/2010  ? HLD (hyperlipidemia) 12/21/2010  ? Acid reflux 12/21/2010  ? Spinal stenosis 12/21/2010  ? ? ?Palliative Care Assessment & Plan  ? ? ?Recommendations/Plan: ?Husband states his children will be coming into town this weekend so that they can further discuss care moving forward.  He states he does not want to make any hasty decisions. ?I will follow-up Tuesday on my return. ? ?Code Status: ? ?  ?Code Status Orders  ?(From admission, onward)  ?  ? ? ?  ? ?  Start     Ordered  ? 06/02/21 0436  Full code  Continuous       ? 06/02/21 0440  ? ?  ?  ? ?  ? ?Code Status History   ? ? Date Active Date Inactive Code Status Order ID Comments User Context  ? 04/10/2021 0911 04/14/2021 1703 Full Code [id number]  Ivor Costa, MD ED  ? 12/10/2020 2342 12/17/2020 1737 Full Code [id number]  Athena Masse, MD ED  ? 12/19/2017 0228 12/25/2017 1831 Full Code [id number]  Lance Coon, MD ED  ? 04/21/2017 0921 04/23/2017 1430 Full Code [id number]  Lance Coon, MD Inpatient  ? 10/03/2015 1340 10/15/2015 2006 Full Code [id number]  Theodoro Grist, MD Inpatient  ? 09/17/2014 0301 09/26/2014 1929 Full Code [id number]  Clapacs, Madie Reno, MD Inpatient  ? ?  ? ? ?Prognosis: ?Poor ? ? ?Thank you for allowing the Palliative Medicine Team to assist in the care of this patient. ? ? ?Asencion Gowda, NP ? ?Please contact Palliative Medicine Team phone at [phone] for questions and concerns.  ? ? ? ? ? ?

## 2021-06-10 NOTE — TOC CM/SW Note (Signed)
Re: Jenna Dudley ?Date of Birth: 1935/12/16 ?Date: 06/10/2021 ? ? ?To Whom It May Concern: ? ?Please be advised that the above-name patient's dementia diagnosis is primary and supersedes her mental illness.  ?

## 2021-06-10 NOTE — Progress Notes (Signed)
PT Cancellation Note ? ?Patient Details ?Name: JAMIA HOBAN ?MRN: [id number] ?DOB: 1935/12/29 ? ? ?Cancelled Treatment:    Reason Eval/Treat Not Completed: Patient declined, no reason specified;Patient's level of consciousness Spoke with RN prior to entering patient's room. Per RN patient has been very confused today, and he had just administered pain medication. Upon entry into room patient was asleep in bed, and awoke to sternal run. Patient refused therapy despite max encouragement. Will re-attempt at a later time/date as available and patient medically appropriate for PT. Thank you!  ? ? ?Angelica Ran, PT  ?06/10/21. 1:50 PM ? ?

## 2021-06-10 NOTE — TOC Progression Note (Addendum)
Transition of Care (TOC) - Progression Note  ? ? ?Patient Details  ?Name: TYRENA HALLS ?MRN: [id number] ?Date of Birth: [birth year] ? ?Transition of Care (TOC) CM/SW Contact  ?Candie Chroman, LCSW ?Phone Number: ?06/10/2021, 1:35 PM ? ?Clinical Narrative:   No family at bedside. Left voicemail for husband. Will discuss SNF recommendation when he calls back. ? ?2:16 pm: Received call back from husband. He is willing to consider SNF placement depending on her needs. He gave permission to send out referral. PASARR under manual review. ? ?Expected Discharge Plan and Services ?  ?  ?  ?  ?  ?                ?  ?  ?  ?  ?  ?  ?  ?  ?  ?  ? ? ?Social Determinants of Health (SDOH) Interventions ?  ? ?Readmission Risk Interventions ?No flowsheet data found. ? ?

## 2021-06-10 NOTE — Progress Notes (Signed)
? ?[facility] ?PROGRESS NOTE ? ? ?DAVIDA FALCONI FIE:[id number] DOB: [birth year] DOA: 06/01/2021  8 ?DOS: the patient was seen and examined on 06/10/2021 ? ?PCP: Aderoju, Jadene Pierini, MD ? ?Brief History and Hospital Course:  ?Jenna Dudley is a pleasant 86 y.o. female with medical history significant for chronic diastolic CHF, GERD, bipolar disorder, history of CVA, and right third toe wound, now presenting to the emergency department for evaluation of chest pain. ?In the hospital, she had heart rate of 114, resp irate 28, lactic acid 4.0.  CTA chest showed segmental PEs, right side opacity with a fluid level.  Also showed additional groundglass opacities, thickened esophagus wall.  And moderate to large right-sided pleural effusion.  Patient was given IV fluids, lactic acid level has dropped down to normal.  She is also started on antibiotics with Unasyn and vancomycin. ?Patient also has a left third toe osteomyelitis peripheral vascular disease.  Angioplasty and stent placement was performed on 3/13.  Underwent toe amputation on 3/14.   ?Experience drop in platelet counts raising concern for HIT.  Lethargy has been noted last 48 hours ? ?Consultants: Podiatry.  Vascular surgery.  Pulmonology ? ?Procedures:  ? ?Toe amputation on the right, 3/14. ? ?3/13 ?Aortogram and selective right lower extremity angiogram ?Percutaneous transluminal angioplasty of right peroneal artery and tibioperoneal trunk with 3 mm diameter angioplasty balloon ?Percutaneous transluminal angioplasty of right distal SFA and popliteal arteries with 5 mm diameter by 30 cm length Lutonix drug-coated angioplasty balloon ?Viabahn stent placement to the distal right SFA and popliteal arteries with 6 mm diameter by 25 cm length stent for greater than 50% stenosis after angioplasty ? ? ?Subjective: ?Patient noted to be more awake and alert this morning.  Pain is better controlled per patient.  Denies any chest pain or shortness of  breath. ? ? ?Assessment/Plan: ? ? ?* Aspiration pneumonia ([place]) ?Patient seen by speech therapy.  On dysphagia 1 diet.  Remains on Unasyn.  Will end on 06/11/2021. ?Respiratory status remains stable. ? ?Pleural effusion on right ?Status post thoracentesis with removing 1.2 L.  Lab study showed 79% neutrophils.  Cultures are negative. ?Glucose high, protein low.  LDH 61.  Appears to be transudate. ? ?Necrotizing pneumonia ([place]) ?Patient seen by pulmonology.  Continue on antibiotics.  Respiratory status is stable for the most part.   ?Blood culture: 1/4 bottle blood culture positive for Corynebacterium.  This is a contaminant. ? ?Severe sepsis ([place]) ?Patient met severe sepsis criteria with tachycardia and tachypnea.  Lactic acid level of 4.0.  This is secondary to pneumonia and toe osteomyelitis. ?Patient is now clinically stable. ? ?Toe osteomyelitis, right ([place]) ?Patient has been evaluated by podiatry,  duplex ultrasound showed peripheral arterial disease, MRI showed third toe osteomyelitis.  Patient has had vascular intervention with angioplasty, stent placement on 3/13.  ?Underwent right toe amputation on 3/14. ?Cultures positive for MRSA.  Sensitivities reviewed.  Will change to doxycycline.  Podiatry had recommended antibiotics for 2 weeks. ? ?PAD (peripheral artery disease) ([place]) ?Duplex ultrasound showed peripheral arterial disease, MRI showed third toe osteomyelitis.  Patient has had vascular intervention with angioplasty, stent placement on 3/13.  ?Patient remains on Plavix and statin. ? ?Pulmonary emboli ([place]) ?Patient was initially placed on IV heparin.  Platelet counts noted to drop significantly.  More than 50% decrease.  See below.  Switched over to argatroban.   ? ?Thrombocytopenia ([place]) ?Significant drop in platelet count noted.  More than 50% drop.  ?Discussed with pharmacy.  HIT antibodies ordered.  Patient was transitioned over to argatroban.   ?Platelet counts have rebounded.   ?Heparin-induced  platelet antibody level is 0.823 OD.  This is a indeterminate diagnosis with a 4T score of 6.  We will go ahead and do a functional assay. ?Continue argatroban for now. ? ? ?Anorexia ?Continues to have very poor appetite.  Started on megestrol.  Patient told importance of eating and drinking.  Continue with nutritional supplements.   ?Palliative Care is following and discussing with patient's husband regarding feeding tube. ? ?Anemia of chronic disease ?Hemoglobin has been low.  Dropped to 7.1 was noted on 3/16.  No overt bleeding noted.  Transfused 1 unit of PRBC.  Hemoglobin 8.8 this morning.  Continue to monitor.   ?Was also given B12 injections during this hospital stay.  B12 level was 377.   ? ?Bipolar 1 disorder ([place]) ?Seroquel held due to lethargy.  Mentation seems to be slightly better today. ? ?Delirium with dementia ?Slightly better today. ? ?Protein-calorie malnutrition, severe ?Continue nutritional supplements.  On megestrol to stimulate her appetite.   ? ?Chest pain ?Secondary to PE.  None currently. ? ?Elevated troponin ?Peak troponin was 108, dropped down to 94.  This is secondary to sepsis and PE. ? ?Hypokalemia ?Repleted. ? ? ? ?DVT Prophylaxis: Argatroban ?Code Status: Full code ?Family Communication: No family at bedside.  Husband was updated yesterday.  We will do so again today. ?Disposition Plan: Skilled nursing facility is recommended ? ?Status is: Inpatient ?Remains inpatient appropriate because: Osteomyelitis right toe, peripheral artery disease, PE, thrombocytopenia ? ? ? ? ?Medications: Scheduled: ? (feeding supplement) PROSource Plus  30 mL Oral TID BM  ? vitamin C  500 mg Oral BID  ? clopidogrel  75 mg Oral Daily  ? feeding supplement  1 Container Oral TID BM  ?  HYDROmorphone (DILAUDID) injection  0.5 mg Intravenous Once  ? mouth rinse  15 mL Mouth Rinse BID  ? megestrol  400 mg Oral Daily  ? metoprolol succinate  25 mg Oral Daily  ? multivitamin with minerals  1 tablet Oral Daily  ?  pantoprazole  40 mg Oral Daily  ? simvastatin  40 mg Oral QHS  ? sodium chloride flush  3 mL Intravenous Q12H  ? sodium chloride flush  3 mL Intravenous Q12H  ? sucralfate  1 g Oral TID WC & HS  ? zinc sulfate  220 mg Oral Daily  ? ?Continuous: ? sodium chloride Stopped (06/04/21 1324)  ? sodium chloride    ? ampicillin-sulbactam (UNASYN) IV 3 g (06/10/21 0930)  ? argatroban 1 mcg/kg/min (06/10/21 0800)  ? potassium chloride 10 mEq (06/10/21 1019)  ? ?EXN:TZGYFV chloride, sodium chloride, acetaminophen **OR** acetaminophen, albuterol, diphenhydrAMINE, morphine injection, ondansetron **OR** ondansetron (ZOFRAN) IV, ondansetron (ZOFRAN) IV, oxyCODONE, polyethylene glycol, sodium chloride flush ? ?Antibiotics: ?Anti-infectives (From admission, onward)  ? ? Start     Dose/Rate Route Frequency Ordered Stop  ? 06/07/21 0900  Ampicillin-Sulbactam (UNASYN) 3 g in sodium chloride 0.9 % 100 mL IVPB       ? 3 g ?200 mL/hr over 30 Minutes Intravenous Every 6 hours 06/07/21 0812 06/12/21 0859  ? 06/06/21 1300  ceFAZolin (ANCEF) IVPB 2g/100 mL premix       ? 2 g ?200 mL/hr over 30 Minutes Intravenous  Once 06/06/21 1249 06/06/21 1354  ? 06/04/21 2100  vancomycin (VANCOCIN) IVPB 1000 mg/200 mL premix       ? 1,000 mg ?200 mL/hr over 60  Minutes Intravenous Every 36 hours 06/04/21 1104 06/08/21 0730  ? 06/03/21 0900  vancomycin (VANCOREADY) IVPB 1250 mg/250 mL  Status:  Discontinued       ? 1,250 mg ?166.7 mL/hr over 90 Minutes Intravenous Every 36 hours 06/02/21 0538 06/02/21 0700  ? 06/03/21 0900  vancomycin (VANCOREADY) IVPB 1250 mg/250 mL  Status:  Discontinued       ? 1,250 mg ?166.7 mL/hr over 90 Minutes Intravenous Every 36 hours 06/02/21 0700 06/04/21 1104  ? 06/02/21 0800  Ampicillin-Sulbactam (UNASYN) 3 g in sodium chloride 0.9 % 100 mL IVPB       ? 3 g ?200 mL/hr over 30 Minutes Intravenous Every 8 hours 06/02/21 0527 06/07/21 0759  ? 06/02/21 0100  cefTRIAXone (ROCEPHIN) 2 g in sodium chloride 0.9 % 100 mL IVPB  Status:   Discontinued       ? 2 g ?200 mL/hr over 30 Minutes Intravenous Every 24 hours 06/02/21 0048 06/02/21 0440  ? 06/02/21 0100  azithromycin (ZITHROMAX) 500 mg in sodium chloride 0.9 % 250 mL IVPB  Status:  Discontinue

## 2021-06-11 DIAGNOSIS — J690 Pneumonitis due to inhalation of food and vomit: Secondary | ICD-10-CM | POA: Diagnosis not present

## 2021-06-11 DIAGNOSIS — I2699 Other pulmonary embolism without acute cor pulmonale: Secondary | ICD-10-CM | POA: Diagnosis not present

## 2021-06-11 DIAGNOSIS — D696 Thrombocytopenia, unspecified: Secondary | ICD-10-CM | POA: Diagnosis not present

## 2021-06-11 DIAGNOSIS — M869 Osteomyelitis, unspecified: Secondary | ICD-10-CM | POA: Diagnosis not present

## 2021-06-11 LAB — BASIC METABOLIC PANEL
Anion gap: 7 (ref 5–15)
BUN: 16 mg/dL (ref 8–23)
CO2: 26 mmol/L (ref 22–32)
Calcium: 8.2 mg/dL — ABNORMAL LOW (ref 8.9–10.3)
Chloride: 106 mmol/L (ref 98–111)
Creatinine, Ser: 0.71 mg/dL (ref 0.44–1.00)
GFR, Estimated: >60 mL/min (ref >60)
Glucose, Bld: 96 mg/dL (ref 70–99)
Potassium: 3.8 mmol/L (ref 3.5–5.1)
Sodium: 139 mmol/L (ref 135–145)

## 2021-06-11 LAB — CBC
HCT: 27.7 % — ABNORMAL LOW (ref 36.0–46.0)
Hemoglobin: 8.6 g/dL — ABNORMAL LOW (ref 12.0–15.0)
MCH: 33.2 pg (ref 26.0–34.0)
MCHC: 31 g/dL (ref 30.0–36.0)
MCV: 106.9 fL — ABNORMAL HIGH (ref 80.0–100.0)
Platelets: 160 10*3/uL (ref 150–400)
RBC: 2.59 MIL/uL — ABNORMAL LOW (ref 3.87–5.11)
RDW: 22.7 % — ABNORMAL HIGH (ref 11.5–15.5)
WBC: 5.5 10*3/uL (ref 4.0–10.5)
nRBC: 0 % (ref 0.0–0.2)

## 2021-06-11 LAB — GLUCOSE, CAPILLARY
Glucose-Capillary: 94 mg/dL (ref 70–99)
Glucose-Capillary: 150 mg/dL — ABNORMAL HIGH (ref 70–99)

## 2021-06-11 LAB — APTT: aPTT: 78 seconds — ABNORMAL HIGH (ref 24–36)

## 2021-06-11 LAB — MAGNESIUM: Magnesium: 1.8 mg/dL (ref 1.7–2.4)

## 2021-06-11 MED ORDER — APIXABAN 5 MG PO TABS
5.0000 mg | ORAL_TABLET | Freq: Two times a day (BID) | ORAL | Status: DC
Start: 2021-06-18 — End: 2021-06-14

## 2021-06-11 MED ORDER — APIXABAN 5 MG PO TABS
10.0000 mg | ORAL_TABLET | Freq: Two times a day (BID) | ORAL | Status: DC
  Administered 2021-06-11 – 2021-06-14 (×7): 10 mg via ORAL
  Filled 2021-06-11 (×7): qty 2

## 2021-06-11 MED ORDER — MIRTAZAPINE 15 MG PO TABS
7.5000 mg | ORAL_TABLET | Freq: Every day | ORAL | Status: DC
  Administered 2021-06-11 – 2021-06-13 (×3): 7.5 mg via ORAL
  Filled 2021-06-11 (×3): qty 1

## 2021-06-11 MED ORDER — ALPRAZOLAM 0.25 MG PO TABS
0.2500 mg | ORAL_TABLET | Freq: Two times a day (BID) | ORAL | Status: DC | PRN
  Administered 2021-06-11 – 2021-06-13 (×4): 0.25 mg via ORAL
  Filled 2021-06-11 (×4): qty 1

## 2021-06-11 NOTE — Progress Notes (Signed)
ANTICOAGULATION CONSULT NOTE ? ?Pharmacy Consult for transitioning from argatroban to apixaban ?Indication: pulmonary embolus ? ?Patient Measurements: ?Height: 5' (152.4 cm) ?Weight: 56.1 kg (123 lb 10.9 oz) ?IBW/kg (Calculated) : 45.5 ?Heparin Dosing Weight: 47.6 kg ? ?Labs: ?Recent Labs  ?  06/09/21 ?7673 06/09/21 ?1832 06/10/21 ?0418 06/11/21 ?4193  ?HGB 7.1* 10.4* 8.8* 8.6*  ?HCT 23.7* 33.8* 28.0* 27.7*  ?PLT 94*  --  135* 160  ?APTT 87*  --  76* 78*  ?CREATININE  --   --  0.75 0.71  ? ? ?Estimated Creatinine Clearance: 41.8 mL/min (by C-G formula based on SCr of 0.71 mg/dL). ? ?Assessment: ?86 year old female admitted with CP found to be a result of segmental/subsegmental PEs in upper and lower lobes. Concomitant toe amputation for OM completed 3/14 PM. ? ?Platelets had dropped from 336 (3/8) > 142 (3/14 AM) > 120 (3/14). Ultimately, platelets had decreased by > 50% from admission.  ? ?Goal of Therapy:  ?Monitor platelets by anticoagulation protocol: Yes ?  ?Plan:  ?Stop argatroban ?Start apixaban 10 mg twice daily for 7 days followed by 5 mg twice daily ?CBC  per protocol  ? ?Lowella Bandy, PharmD ?06/11/2021 ?11:14 AM ? ? ?

## 2021-06-11 NOTE — Progress Notes (Signed)
? ?[facility] ?PROGRESS NOTE ? ? ?Jenna Dudley YOV:[id number] DOB: 1935/09/20 DOA: 06/01/2021  9 ?DOS: the patient was seen and examined on 06/11/2021 ? ?PCP: Aderoju, Jadene Pierini, MD ? ?Brief History and Hospital Course:  ?Jenna Dudley is a pleasant 86 y.o. female with medical history significant for chronic diastolic CHF, GERD, bipolar disorder, history of CVA, and right third toe wound, now presenting to the emergency department for evaluation of chest pain. ?In the hospital, she had heart rate of 114, resp irate 28, lactic acid 4.0.  CTA chest showed segmental PEs, right side opacity with a fluid level.  Also showed additional groundglass opacities, thickened esophagus wall.  And moderate to large right-sided pleural effusion.  Patient was given IV fluids, lactic acid level has dropped down to normal.  She is also started on antibiotics with Unasyn and vancomycin. ?Patient also has a left third toe osteomyelitis peripheral vascular disease.  Angioplasty and stent placement was performed on 3/13.  Underwent toe amputation on 3/14.   ?Experienced drop in platelet counts raising concern for HIT.   ? ?Consultants: Podiatry.  Vascular surgery.  Pulmonology ? ?Procedures:  ? ?Toe amputation on the right, 3/14. ? ?3/13 ?Aortogram and selective right lower extremity angiogram ?Percutaneous transluminal angioplasty of right peroneal artery and tibioperoneal trunk with 3 mm diameter angioplasty balloon ?Percutaneous transluminal angioplasty of right distal SFA and popliteal arteries with 5 mm diameter by 30 cm length Lutonix drug-coated angioplasty balloon ?Viabahn stent placement to the distal right SFA and popliteal arteries with 6 mm diameter by 25 cm length stent for greater than 50% stenosis after angioplasty ? ? ?Subjective: ?Patient complaining of low back pain this morning.  Noted to be very anxious. ? ? ?Assessment/Plan: ? ? ?* Aspiration pneumonia ([place]) ?Patient seen by speech therapy.  On dysphagia  1 diet.  Completed course of Unasyn.  Respiratory status is stable. ? ?Pleural effusion on right ?Status post thoracentesis with removing 1.2 L.  Lab study showed 79% neutrophils.  Cultures are negative. ?Glucose high, protein low.  LDH 61.  Appears to be transudate. ? ?Necrotizing pneumonia ([place]) ?Patient seen by pulmonology.  Completed course of antibiotics. ?Blood culture: 1/4 bottle blood culture positive for Corynebacterium.  This is a contaminant. ? ?Severe sepsis ([place]) ?Patient met severe sepsis criteria with tachycardia and tachypnea.  Lactic acid level of 4.0.  This is secondary to pneumonia and toe osteomyelitis. ?Patient is now clinically stable. ? ?Toe osteomyelitis, right ([place]) ?Patient evaluated by podiatry. ?Duplex ultrasound showed peripheral arterial disease. ?MRI showed third toe osteomyelitis.  Patient has had vascular intervention with angioplasty, stent placement on 3/13.  ?Underwent right toe amputation on 3/14. ?Bone/wound cultures positive for MRSA.  Sensitivities reviewed.  Patient changed over to doxycycline to be given for 14 days.   ? ?PAD (peripheral artery disease) ([place]) ?Duplex ultrasound showed peripheral arterial disease, MRI showed third toe osteomyelitis.  Patient has had vascular intervention with angioplasty, stent placement on 3/13.  ?Patient remains on Plavix and statin. ? ?Pulmonary emboli ([place]) ?Patient was initially placed on IV heparin.  Platelet counts noted to drop significantly.  More than 50% decrease.  See below.   ?Switched over to argatroban.   ?Counts have improved.  SRA is pending.  Okay to transition to oral anticoagulation.  Will initiate apixaban. ? ?Thrombocytopenia ([place]) ?Significant drop in platelet count noted.  More than 50% drop.  ?Discussed with pharmacy.  HIT antibodies ordered.  Patient was transitioned over to argatroban.   ?  Platelet counts have rebounded.   ?Heparin-induced platelet antibody level is 0.823 OD.  This is a indeterminate diagnosis with a  4T score of 6.  In view of this a functional assay, SRA, was ordered.  Results are pending. ?Platelet counts are now normal.  Transition to apixaban. ? ?Anorexia ?Continues to have very poor appetite.  Started on megestrol.  Patient told importance of eating and drinking.  Continue with nutritional supplements.   ?Palliative Care is following and discussing with patient's husband regarding feeding tube. ?Patient has multiple family members visiting today.  Husband hopes that her oral intake will improve. ? ?Anemia of chronic disease ?Hemoglobin has been low.  Dropped to 7.1 was noted on 3/16.  No overt bleeding noted.  Transfused 1 unit of PRBC.  Hemoglobin responded and noted to be stable.  Continue to monitor. ?Was also given B12 injections during this hospital stay.  B12 level was 377.   ? ?Bipolar 1 disorder ([place]) ?Seroquel held due to lethargy.  Mentation has improved.  Continue to monitor. ? ?Delirium with dementia ?Seems to be better. ? ?Protein-calorie malnutrition, severe ?Continue nutritional supplements.  On megestrol to stimulate her appetite.  Patient encouraged to eat her meals.  Discussed with husband. ? ?Chest pain ?Secondary to PE.  None currently. ? ?Elevated troponin ?Peak troponin was 108, dropped down to 94.  This is secondary to sepsis and PE. ? ?Hypokalemia ?Repleted. ? ? ? ?DVT Prophylaxis: Argatroban ?Code Status: Full code ?Family Communication: No family at bedside.  husband was updated yesterday.  We will do so again today. ?Disposition Plan: Skilled nursing facility is recommended ? ?Status is: Inpatient ?Remains inpatient appropriate because: Osteomyelitis right toe, peripheral artery disease, PE, thrombocytopenia ? ? ? ? ?Medications: Scheduled: ? (feeding supplement) PROSource Plus  30 mL Oral TID BM  ? vitamin C  500 mg Oral BID  ? clopidogrel  75 mg Oral Daily  ? doxycycline  100 mg Oral Q12H  ? feeding supplement  1 Container Oral TID BM  ? mouth rinse  15 mL Mouth Rinse BID  ?  megestrol  400 mg Oral Daily  ? metoprolol succinate  25 mg Oral Daily  ? multivitamin with minerals  1 tablet Oral Daily  ? pantoprazole  40 mg Oral Daily  ? simvastatin  40 mg Oral QHS  ? sodium chloride flush  3 mL Intravenous Q12H  ? sodium chloride flush  3 mL Intravenous Q12H  ? sucralfate  1 g Oral TID WC & HS  ? zinc sulfate  220 mg Oral Daily  ? ?Continuous: ? sodium chloride Stopped (06/04/21 1324)  ? sodium chloride    ? argatroban 1 mcg/kg/min (06/11/21 0503)  ? ?HEN:IDPOEU chloride, sodium chloride, acetaminophen **OR** acetaminophen, albuterol, diphenhydrAMINE, morphine injection, ondansetron **OR** ondansetron (ZOFRAN) IV, ondansetron (ZOFRAN) IV, oxyCODONE, polyethylene glycol, sodium chloride flush ? ?Antibiotics: ?Anti-infectives (From admission, onward)  ? ? Start     Dose/Rate Route Frequency Ordered Stop  ? 06/10/21 1215  doxycycline (VIBRA-TABS) tablet 100 mg       ? 100 mg Oral Every 12 hours 06/10/21 1124 06/24/21 0959  ? 06/07/21 0900  Ampicillin-Sulbactam (UNASYN) 3 g in sodium chloride 0.9 % 100 mL IVPB  Status:  Discontinued       ? 3 g ?200 mL/hr over 30 Minutes Intravenous Every 6 hours 06/07/21 0812 06/10/21 1124  ? 06/06/21 1300  ceFAZolin (ANCEF) IVPB 2g/100 mL premix       ? 2 g ?200 mL/hr  over 30 Minutes Intravenous  Once 06/06/21 1249 06/06/21 1354  ? 06/04/21 2100  vancomycin (VANCOCIN) IVPB 1000 mg/200 mL premix       ? 1,000 mg ?200 mL/hr over 60 Minutes Intravenous Every 36 hours 06/04/21 1104 06/08/21 0730  ? 06/03/21 0900  vancomycin (VANCOREADY) IVPB 1250 mg/250 mL  Status:  Discontinued       ? 1,250 mg ?166.7 mL/hr over 90 Minutes Intravenous Every 36 hours 06/02/21 0538 06/02/21 0700  ? 06/03/21 0900  vancomycin (VANCOREADY) IVPB 1250 mg/250 mL  Status:  Discontinued       ? 1,250 mg ?166.7 mL/hr over 90 Minutes Intravenous Every 36 hours 06/02/21 0700 06/04/21 1104  ? 06/02/21 0800  Ampicillin-Sulbactam (UNASYN) 3 g in sodium chloride 0.9 % 100 mL IVPB       ? 3 g ?200  mL/hr over 30 Minutes Intravenous Every 8 hours 06/02/21 0527 06/07/21 0759  ? 06/02/21 0100  cefTRIAXone (ROCEPHIN) 2 g in sodium chloride 0.9 % 100 mL IVPB  Status:  Discontinued       ? 2 g ?200 mL/h

## 2021-06-11 NOTE — Progress Notes (Signed)
ANTICOAGULATION CONSULT NOTE ? ?Pharmacy Consult for argatroban ?Indication: pulmonary embolus ? ?Patient Measurements: ?Height: 5' (152.4 cm) ?Weight: 56.1 kg (123 lb 10.9 oz) ?IBW/kg (Calculated) : 45.5 ?Heparin Dosing Weight: 47.6 kg ? ?Labs: ?Recent Labs  ?  06/08/21 ?0753 06/08/21 ?1425 06/09/21 ?5208 06/09/21 ?1832 06/10/21 ?0418 06/11/21 ?0223  ?HGB  --    < > 7.1* 10.4* 8.8* 8.6*  ?HCT  --    < > 23.7* 33.8* 28.0* 27.7*  ?PLT  --   --  94*  --  135* 160  ?APTT  --    < > 87*  --  76* 78*  ?HEPARINUNFRC 0.29*  --   --   --   --   --   ?CREATININE  --   --   --   --  0.75 0.71  ? < > = values in this interval not displayed.  ? ? ?Estimated Creatinine Clearance: 41.8 mL/min (by C-G formula based on SCr of 0.71 mg/dL). ? ?Assessment: ?86 year old female admitted with CP found to be a result of segmental/subsegmental PEs in upper and lower lobes. Concomitant toe amputation for OM completed 3/14 PM. ? ?Platelets had dropped from 336 (3/8) > 142 (3/14 AM) > 120 (3/14). Ultimately, platelets had decreased by > 50% from admission. History of anemia, though Hgb has remained stable at ~8. Estimated 4T score for HITT is ~5 (intermediate risk).  ? ?Baseline aPTT 31. ? ?Date/Time aPTT Comment/Rate ?0315 1425 51 1 mcg/kg/min (53 mcg/min) ?0315 1907  75  1 mcg/kg/min (53 mcg/min) ?0316 0543      87        1 mcg/kg/min (53 mcg/min) ?0317 0418      76        1 mcg/kg/min (53 mcg/min) ?0318 0524      78        1 mcg/kg/min (53 mcg/min)  ? ?Goal of Therapy:  ?aPTT 50-90 seconds ?Monitor platelets by anticoagulation protocol: Yes ?  ?Plan:  ?3/18:  aPTT = 78, therapeutic X 5 ?Will continue pt on current rate and recheck aPTT on 3/19 with AM labs.   ? ?Jenna Dudley D, PharmD ?06/11/2021 ?6:15 AM ? ? ?

## 2021-06-12 DIAGNOSIS — M869 Osteomyelitis, unspecified: Secondary | ICD-10-CM | POA: Diagnosis not present

## 2021-06-12 DIAGNOSIS — I2699 Other pulmonary embolism without acute cor pulmonale: Secondary | ICD-10-CM | POA: Diagnosis not present

## 2021-06-12 DIAGNOSIS — D696 Thrombocytopenia, unspecified: Secondary | ICD-10-CM | POA: Diagnosis not present

## 2021-06-12 LAB — AEROBIC/ANAEROBIC CULTURE W GRAM STAIN (SURGICAL/DEEP WOUND): Gram Stain: NONE SEEN

## 2021-06-12 LAB — CBC
HCT: 30.6 % — ABNORMAL LOW (ref 36.0–46.0)
Hemoglobin: 9.6 g/dL — ABNORMAL LOW (ref 12.0–15.0)
MCH: 33.6 pg (ref 26.0–34.0)
MCHC: 31.4 g/dL (ref 30.0–36.0)
MCV: 107 fL — ABNORMAL HIGH (ref 80.0–100.0)
Platelets: 191 10*3/uL (ref 150–400)
RBC: 2.86 MIL/uL — ABNORMAL LOW (ref 3.87–5.11)
RDW: 22.9 % — ABNORMAL HIGH (ref 11.5–15.5)
WBC: 6 10*3/uL (ref 4.0–10.5)
nRBC: 0 % (ref 0.0–0.2)

## 2021-06-12 LAB — GLUCOSE, CAPILLARY
Glucose-Capillary: 89 mg/dL (ref 70–99)
Glucose-Capillary: 106 mg/dL — ABNORMAL HIGH (ref 70–99)

## 2021-06-12 LAB — APTT: aPTT: 26 seconds (ref 24–36)

## 2021-06-12 MED ORDER — BENZOCAINE 10 % MT GEL
Freq: Three times a day (TID) | OROMUCOSAL | Status: DC | PRN
  Administered 2021-06-12: 1 via OROMUCOSAL
  Filled 2021-06-12: qty 9

## 2021-06-12 MED ORDER — FUROSEMIDE 10 MG/ML IJ SOLN
20.0000 mg | Freq: Once | INTRAMUSCULAR | Status: AC
Start: 2021-06-12 — End: 2021-06-12
  Administered 2021-06-12: 20 mg via INTRAVENOUS
  Filled 2021-06-12: qty 2

## 2021-06-12 MED ORDER — POTASSIUM CHLORIDE CRYS ER 20 MEQ PO TBCR
40.0000 meq | EXTENDED_RELEASE_TABLET | Freq: Once | ORAL | Status: AC
Start: 2021-06-12 — End: 2021-06-12
  Administered 2021-06-12: 40 meq via ORAL
  Filled 2021-06-12: qty 2

## 2021-06-12 NOTE — TOC Progression Note (Signed)
Transition of Care (TOC) - Progression Note  ? ? ?Patient Details  ?Name: Jenna Dudley ?MRN: [id number] ?Date of Birth: 06-24-1935 ? ?Transition of Care (TOC) CM/SW Contact  ?[place], [place],  ?Phone Number: ?06/12/2021, 3:24 PM ? ?Clinical Narrative:    ?Phone call to patient's spouse to review bed offer. Voicemail message left requesting a return call. ? ?[employer], LCSW ?Transition of Care ?[phone] ? ? ? ?  ?  ? ?Expected Discharge Plan and Services ?  ?  ?  ?  ?  ?                ?  ?  ?  ?  ?  ?  ?  ?  ?  ?  ? ? ?Social Determinants of Health (SDOH) Interventions ?  ? ?Readmission Risk Interventions ?No flowsheet data found. ? ?

## 2021-06-12 NOTE — Progress Notes (Signed)
? ?[facility] ?PROGRESS NOTE ? ? ?Jenna Dudley ASN:[id number] DOB: 01-14-1936 DOA: 06/01/2021  10 ?DOS: the patient was seen and examined on 06/12/2021 ? ?PCP: Aderoju, Jadene Pierini, MD ? ?Brief History and Hospital Course:  ?Jenna Dudley is a pleasant 86 y.o. female with medical history significant for chronic diastolic CHF, GERD, bipolar disorder, history of CVA, and right third toe wound, now presenting to the emergency department for evaluation of chest pain. ?In the hospital, she had heart rate of 114, resp irate 28, lactic acid 4.0.  CTA chest showed segmental PEs, right side opacity with a fluid level.  Also showed additional groundglass opacities, thickened esophagus wall.  And moderate to large right-sided pleural effusion.  Patient was given IV fluids, lactic acid level has dropped down to normal.  She is also started on antibiotics with Unasyn and vancomycin. ?Patient also has a left third toe osteomyelitis peripheral vascular disease.  Angioplasty and stent placement was performed on 3/13.  Underwent toe amputation on 3/14.   ?Experienced drop in platelet counts raising concern for HIT.  Patient gradually improving. ? ?Consultants: Podiatry.  Vascular surgery.  Pulmonology ? ?Procedures:  ? ?Toe amputation on the right, 3/14. ? ?3/13 ?Aortogram and selective right lower extremity angiogram ?Percutaneous transluminal angioplasty of right peroneal artery and tibioperoneal trunk with 3 mm diameter angioplasty balloon ?Percutaneous transluminal angioplasty of right distal SFA and popliteal arteries with 5 mm diameter by 30 cm length Lutonix drug-coated angioplasty balloon ?Viabahn stent placement to the distal right SFA and popliteal arteries with 6 mm diameter by 25 cm length stent for greater than 50% stenosis after angioplasty ? ? ?Subjective: ?Patient sleepy this morning but easily arousable.  Mentions that she is feeling better.  Complains of back pain.  No shortness of breath.  No  significant pain in her right foot.  Appetite is improving ? ?Assessment/Plan: ? ? ?* Aspiration pneumonia ([place]) ?Patient seen by speech therapy.  On dysphagia 1 diet.  Completed course of Unasyn.  Respiratory status is stable.  Saturating normal on room air. ? ?Pleural effusion on right ?Status post thoracentesis with removing 1.2 L.  Lab study showed 79% neutrophils.  Cultures are negative. ?Glucose high, protein low.  LDH 61.  Appears to be transudate. ? ?Necrotizing pneumonia ([place]) ?Patient seen by pulmonology.  Completed course of antibiotics. ?Blood culture: 1/4 bottle blood culture positive for Corynebacterium.  This is a contaminant. ? ?Severe sepsis ([place]) ?Patient met severe sepsis criteria with tachycardia and tachypnea.  Lactic acid level of 4.0.  This is secondary to pneumonia and toe osteomyelitis. ?Patient is now clinically stable. ? ?Toe osteomyelitis, right ([place]) ?Patient evaluated by podiatry. ?Duplex ultrasound showed peripheral arterial disease. ?MRI showed third toe osteomyelitis.  Patient has had vascular intervention with angioplasty, stent placement on 3/13.  ?Underwent right toe amputation on 3/14. ?Bone/wound cultures positive for MRSA.  Sensitivities reviewed.  Patient changed over to doxycycline, to be given for 14 days.   ? ?PAD (peripheral artery disease) ([place]) ?Duplex ultrasound showed peripheral arterial disease, MRI showed third toe osteomyelitis.  Patient has had vascular intervention with angioplasty, stent placement on 3/13.  ?Patient remains on Plavix and statin. ? ?Pulmonary emboli ([place]) ?Patient was initially placed on IV heparin.  Platelet counts noted to drop significantly.  More than 50% decrease.  See below.   ?Switched over to argatroban.   ?Counts have improved.  SRA is pending.   ?Patient was transition to apixaban.   ? ?Thrombocytopenia ([place]) ?Significant  drop in platelet count noted.  More than 50% drop.  ?Discussed with pharmacy.  HIT antibodies ordered.  Patient was  transitioned over to argatroban.   ?Platelet counts have rebounded.   ?Heparin-induced platelet antibody level is 0.823 OD.  This is a indeterminate diagnosis with a 4T score of 6.  In view of this a functional assay, SRA, was ordered.  Results are pending. ?Platelet counts are now normal.  Transitioned to apixaban. ? ?Anorexia ?Continues to have very poor appetite.  Started on megestrol.  Patient told importance of eating and drinking.  Continue with nutritional supplements.   ?Palliative Care is following and discussing with patient's husband regarding feeding tube. ?Oral intake has improved some.  Continue to encourage.  Husband is hopeful of improvement.  He has not wanted a feeding tube.  This is a reasonable approach ? ?Anemia of chronic disease ?Hemoglobin has been low.  Dropped to 7.1 was noted on 3/16.  No overt bleeding noted.  Transfused 1 unit of PRBC.  ?Hemoglobin responded and noted to be stable.  ?Was also given B12 injections during this hospital stay.  B12 level was 377.   ? ?Bipolar 1 disorder ([place]) ?Seroquel held due to lethargy.  Mentation has improved.  Continue to monitor. ?Home medication list was reviewed.  Started back on mirtazapine at low-dose which may help her appetite. ? ?Delirium with dementia ?Seems to be better. ? ?Protein-calorie malnutrition, severe ?Continue nutritional supplements.  On megestrol to stimulate her appetite.  Patient encouraged to eat her meals.  Discussed with husband. ? ?Chest pain ?Secondary to PE.  None currently. ? ?Elevated troponin ?[facility] troponin was 108, dropped down to 94.  This is secondary to sepsis and PE. ? ?Hypokalemia ?Repleted. ? ? ? ?DVT Prophylaxis: Apixaban ?Code Status: Full code ?Family Communication: No family at bedside.  Husband will be updated later today.   ?Disposition Plan: Skilled nursing facility is recommended ? ?Status is: Inpatient ?Remains inpatient appropriate because: Osteomyelitis right toe, peripheral artery disease, PE,  thrombocytopenia ? ? ? ? ?Medications: Scheduled: ? (feeding supplement) PROSource Plus  30 mL Oral TID BM  ? apixaban  10 mg Oral BID  ? Followed by  ? [START ON 06/18/2021] apixaban  5 mg Oral BID  ? vitamin C  500 mg Oral BID  ? clopidogrel  75 mg Oral Daily  ? doxycycline  100 mg Oral Q12H  ? feeding supplement  1 Container Oral TID BM  ? mouth rinse  15 mL Mouth Rinse BID  ? megestrol  400 mg Oral Daily  ? metoprolol succinate  25 mg Oral Daily  ? mirtazapine  7.5 mg Oral QHS  ? multivitamin with minerals  1 tablet Oral Daily  ? pantoprazole  40 mg Oral Daily  ? simvastatin  40 mg Oral QHS  ? sodium chloride flush  3 mL Intravenous Q12H  ? sodium chloride flush  3 mL Intravenous Q12H  ? sucralfate  1 g Oral TID WC & HS  ? zinc sulfate  220 mg Oral Daily  ? ?Continuous: ? sodium chloride Stopped (06/04/21 1324)  ? sodium chloride    ? ?DJS:HFWYOV chloride, sodium chloride, acetaminophen **OR** acetaminophen, albuterol, ALPRAZolam, diphenhydrAMINE, morphine injection, ondansetron **OR** ondansetron (ZOFRAN) IV, ondansetron (ZOFRAN) IV, oxyCODONE, polyethylene glycol, sodium chloride flush ? ?Antibiotics: ?Anti-infectives (From admission, onward)  ? ? Start     Dose/Rate Route Frequency Ordered Stop  ? 06/10/21 1215  doxycycline (VIBRA-TABS) tablet 100 mg       ?  100 mg Oral Every 12 hours 06/10/21 1124 06/24/21 0959  ? 06/07/21 0900  Ampicillin-Sulbactam (UNASYN) 3 g in sodium chloride 0.9 % 100 mL IVPB  Status:  Discontinued       ? 3 g ?200 mL/hr over 30 Minutes Intravenous Every 6 hours 06/07/21 0812 06/10/21 1124  ? 06/06/21 1300  ceFAZolin (ANCEF) IVPB 2g/100 mL premix       ? 2 g ?200 mL/hr over 30 Minutes Intravenous  Once 06/06/21 1249 06/06/21 1354  ? 06/04/21 2100  vancomycin (VANCOCIN) IVPB 1000 mg/200 mL premix       ? 1,000 mg ?200 mL/hr over 60 Minutes Intravenous Every 36 hours 06/04/21 1104 06/08/21 0730  ? 06/03/21 0900  vancomycin (VANCOREADY) IVPB 1250 mg/250 mL  Status:  Discontinued       ?  1,250 mg ?166.7 mL/hr over 90 Minutes Intravenous Every 36 hours 06/02/21 0538 06/02/21 0700  ? 06/03/21 0900  vancomycin (VANCOREADY) IVPB 1250 mg/250 mL  Status:  Discontinued       ? 1,250 mg ?166.7 mL/hr over 90 Minu

## 2021-06-12 NOTE — Plan of Care (Signed)
°  Problem: Clinical Measurements: °Goal: Respiratory complications will improve °Outcome: Progressing °  °Problem: Clinical Measurements: °Goal: Cardiovascular complication will be avoided °Outcome: Progressing °  °Problem: Pain Managment: °Goal: General experience of comfort will improve °Outcome: Progressing °  °Problem: Safety: °Goal: Ability to remain free from injury will improve °Outcome: Progressing °  °

## 2021-06-13 ENCOUNTER — Other Ambulatory Visit (HOSPITAL_COMMUNITY): Payer: Self-pay

## 2021-06-13 ENCOUNTER — Inpatient Hospital Stay: Payer: Medicare Other

## 2021-06-13 DIAGNOSIS — M869 Osteomyelitis, unspecified: Secondary | ICD-10-CM | POA: Diagnosis not present

## 2021-06-13 DIAGNOSIS — D696 Thrombocytopenia, unspecified: Secondary | ICD-10-CM | POA: Diagnosis not present

## 2021-06-13 DIAGNOSIS — I2699 Other pulmonary embolism without acute cor pulmonale: Secondary | ICD-10-CM | POA: Diagnosis not present

## 2021-06-13 LAB — CBC
HCT: 31.1 % — ABNORMAL LOW (ref 36.0–46.0)
Hemoglobin: 9.5 g/dL — ABNORMAL LOW (ref 12.0–15.0)
MCH: 33.2 pg (ref 26.0–34.0)
MCHC: 30.5 g/dL (ref 30.0–36.0)
MCV: 108.7 fL — ABNORMAL HIGH (ref 80.0–100.0)
Platelets: 201 10*3/uL (ref 150–400)
RBC: 2.86 MIL/uL — ABNORMAL LOW (ref 3.87–5.11)
RDW: 22.3 % — ABNORMAL HIGH (ref 11.5–15.5)
WBC: 7.4 10*3/uL (ref 4.0–10.5)
nRBC: 0 % (ref 0.0–0.2)

## 2021-06-13 LAB — BASIC METABOLIC PANEL
Anion gap: 6 (ref 5–15)
BUN: 24 mg/dL — ABNORMAL HIGH (ref 8–23)
CO2: 28 mmol/L (ref 22–32)
Calcium: 8.3 mg/dL — ABNORMAL LOW (ref 8.9–10.3)
Chloride: 106 mmol/L (ref 98–111)
Creatinine, Ser: 0.76 mg/dL (ref 0.44–1.00)
GFR, Estimated: >60 mL/min (ref >60)
Glucose, Bld: 97 mg/dL (ref 70–99)
Potassium: 4.2 mmol/L (ref 3.5–5.1)
Sodium: 140 mmol/L (ref 135–145)

## 2021-06-13 LAB — MISC LABCORP TEST (SEND OUT): Labcorp test code: 5367

## 2021-06-13 LAB — GLUCOSE, CAPILLARY: Glucose-Capillary: 94 mg/dL (ref 70–99)

## 2021-06-13 MED ORDER — PROSOURCE PLUS PO LIQD: 30.0000 mL | Freq: Three times a day (TID) | ORAL | Status: AC

## 2021-06-13 MED ORDER — ASCORBIC ACID 500 MG PO TABS
500.0000 mg | ORAL_TABLET | Freq: Two times a day (BID) | ORAL | Status: AC
Start: 2021-06-13 — End: ?

## 2021-06-13 MED ORDER — CLOPIDOGREL BISULFATE 75 MG PO TABS: 75.0000 mg | ORAL_TABLET | Freq: Every day | ORAL | Status: DC

## 2021-06-13 MED ORDER — DOXYCYCLINE HYCLATE 100 MG PO TABS: 100.0000 mg | ORAL_TABLET | Freq: Two times a day (BID) | ORAL | Status: AC

## 2021-06-13 MED ORDER — OXYCODONE HCL 5 MG PO TABS: 5.0000 mg | ORAL_TABLET | Freq: Four times a day (QID) | ORAL | 0 refills | Status: AC | PRN

## 2021-06-13 MED ORDER — MIRTAZAPINE 7.5 MG PO TABS: 7.5000 mg | ORAL_TABLET | Freq: Every day | ORAL | Status: AC

## 2021-06-13 MED ORDER — SUCRALFATE 1 G PO TABS: 1.0000 g | ORAL_TABLET | Freq: Three times a day (TID) | ORAL | Status: AC

## 2021-06-13 MED ORDER — ZINC SULFATE 220 (50 ZN) MG PO CAPS: 220.0000 mg | ORAL_CAPSULE | Freq: Every day | ORAL | Status: DC

## 2021-06-13 MED ORDER — ADULT MULTIVITAMIN W/MINERALS CH: 1.0000 | ORAL_TABLET | Freq: Every day | ORAL | Status: AC

## 2021-06-13 MED ORDER — MEGESTROL ACETATE 400 MG/10ML PO SUSP
400.0000 mg | Freq: Every day | ORAL | 0 refills | Status: AC
Start: 2021-06-14 — End: ?

## 2021-06-13 MED ORDER — METOPROLOL SUCCINATE ER 25 MG PO TB24: 25.0000 mg | ORAL_TABLET | Freq: Every day | ORAL | Status: DC

## 2021-06-13 MED ORDER — ACETAMINOPHEN 325 MG PO TABS: 650.0000 mg | ORAL_TABLET | Freq: Four times a day (QID) | ORAL | Status: AC | PRN

## 2021-06-13 MED ORDER — FUROSEMIDE 10 MG/ML IJ SOLN
20.0000 mg | Freq: Once | INTRAMUSCULAR | Status: AC
  Administered 2021-06-13: 20 mg via INTRAVENOUS
  Filled 2021-06-13: qty 2

## 2021-06-13 NOTE — Progress Notes (Signed)
Physical Therapy Treatment ?Patient Details ?Name: Jenna Dudley ?MRN: [id number] ?DOB: [birth year] ?Today's Date: 06/13/2021 ? ? ?History of Present Illness Patient is an 86 year old female with a PMH (+) for chronic diastolic CHF, GERD, bipolar disorder, history of CVA, and right third toe wound. Patient presented to [facility] ED on 06/01/21 for chest pain, and was admitted for Aspiration pneumonia. s/p R 3rd toe amputation 06/07/21 ? ?  ?PT Comments  ? ? Pt is making limited progress towards goals and is very anxious upon arrival. Pt reports she got herself back to bed (recliner still with legs extended & O2 tubing removed). Pt assisted with repositioning and agreed to minimal there-x, however declined further participation due to pain. She was insistent to have this Probation officer ask RN for meds to assist with sleeping. Concerns relayed to RN. Will continue to progress as able.   ?Recommendations for follow up therapy are one component of a multi-disciplinary discharge planning process, led by the attending physician.  Recommendations may be updated based on patient status, additional functional criteria and insurance authorization. ? ?Follow Up Recommendations ? Skilled nursing-short term rehab (<3 hours/day) ?  ?  ?Assistance Recommended at Discharge Frequent or constant Supervision/Assistance  ?Patient can return home with the following Help with stairs or ramp for entrance;A little help with walking and/or transfers;A little help with bathing/dressing/bathroom ?  ?Equipment Recommendations ? None recommended by PT  ?  ?Recommendations for Other Services   ? ? ?  ?Precautions / Restrictions Precautions ?Precautions: Fall ?Precaution Comments: pt is impulsive ?Restrictions ?Weight Bearing Restrictions: No ?RLE Weight Bearing: Partial weight bearing ?Other Position/Activity Restrictions: per podiatrist, minimal weight bearing on R LE for transitions only, needs surgical shoe  ?  ? ?Mobility ? Bed Mobility ?Overal bed mobility:  Needs Assistance ?  ?  ?  ?  ?  ?  ?General bed mobility comments: pt in sidelying upon arrival, needs assist for sliding up in bed and repositioning. Pillow placed under B LEs for comfort. O2 removed upon arrival with sats at 80%. 2L donned with sats quickly improving to 90% with cues for PLB. ?  ? ?Transfers ?  ?  ?  ?  ?  ?  ?  ?  ?  ?General transfer comment: declined ?  ? ?Ambulation/Gait ?  ?  ?  ?  ?  ?  ?  ?  ? ? ?Stairs ?  ?  ?  ?  ?  ? ? ?Wheelchair Mobility ?  ? ?Modified Rankin (Stroke Patients Only) ?  ? ? ?  ?Balance   ?  ?  ?  ?  ?  ?  ?  ?  ?  ?  ?  ?  ?  ?  ?  ?  ?  ?  ?  ? ?  ?Cognition Arousal/Alertness: Awake/alert ?Behavior During Therapy: Anxious ?Overall Cognitive Status: No family/caregiver present to determine baseline cognitive functioning ?  ?  ?  ?  ?  ?  ?  ?  ?  ?  ?  ?  ?  ?  ?  ?  ?General Comments: very anxious, follows commands ?  ?  ? ?  ?Exercises Other Exercises ?Other Exercises: Pt performed minimal ther-ex including knee extension and hip abd/add. 5-8 reps with pt quickly declining further activity ? ?  ?General Comments   ?  ?  ? ?Pertinent Vitals/Pain Pain Assessment ?Pain Assessment: Faces ?Faces Pain Scale: Hurts a little bit ?Pain Location:  generalized pain everywhere ?Pain Descriptors / Indicators: Aching, Discomfort ?Pain Intervention(s): Limited activity within patient's tolerance, Repositioned  ? ? ?Home Living   ?  ?  ?  ?  ?  ?  ?  ?  ?  ?   ?  ?Prior Function    ?  ?  ?   ? ?PT Goals (current goals can now be found in the care plan section) Acute Rehab PT Goals ?Patient Stated Goal: none stated ?Time For Goal Achievement: 06/19/21 ?Potential to Achieve Goals: Good ?Progress towards PT goals: Progressing toward goals ? ?  ?Frequency ? ? ? Min 2X/week ? ? ? ?  ?PT Plan Discharge plan needs to be updated  ? ? ?Co-evaluation   ?  ?  ?  ?  ? ?  ?AM-PAC PT "6 Clicks" Mobility   ?Outcome Measure ? Help needed turning from your back to your side while in a flat bed without  using bedrails?: A Little ?Help needed moving from lying on your back to sitting on the side of a flat bed without using bedrails?: A Little ?Help needed moving to and from a bed to a chair (including a wheelchair)?: A Little ?Help needed standing up from a chair using your arms (e.g., wheelchair or bedside chair)?: A Little ?Help needed to walk in hospital room?: A Lot ?Help needed climbing 3-5 steps with a railing? : A Lot ?6 Click Score: 16 ? ?  ?End of Session Equipment Utilized During Treatment: Oxygen ?Activity Tolerance: Patient limited by pain ?Patient left: in bed;with bed alarm set ?Nurse Communication: Mobility status;Weight bearing status ?PT Visit Diagnosis: Unsteadiness on feet (R26.81);Muscle weakness (generalized) (M62.81);Other abnormalities of gait and mobility (R26.89);Pain ?Pain - Right/Left: Right ?Pain - part of body: Ankle and joints of foot ?  ? ? ?Time: [id number] ?PT Time Calculation (min) (ACUTE ONLY): 11 min ? ?Charges:  $Therapeutic Activity: 8-22 mins          ?          ? ?Greggory Stallion, PT, DPT, GCS ?[phone] ? ? ? ?Koy Lamp ?06/13/2021, 4:30 PM ? ?

## 2021-06-13 NOTE — Progress Notes (Signed)
SLP Cancellation Note ? ?Patient Details ?Name: Jenna Dudley ?MRN: [id number] ?DOB: [birth year] ? ? ?Cancelled treatment:       Reason Eval/Treat Not Completed: Patient declined  ? ?Attempted diet tolerance/meal observation. Pt politely declined participation in meal observation. Will continue efforts as appropriate. ? ?Cherrie Gauze, M.S., CCC-SLP ?Speech-Language Pathologist ?[facility] ?[phone] ([place]) ? ?Jenna Dudley ?06/13/2021, 1:40 PM ?

## 2021-06-13 NOTE — TOC Benefit Eligibility Note (Signed)
Patient Advocate Encounter ? ?Insurance verification completed.   ? ?The patient is currently admitted and upon discharge could be taking Eliquis Starter Pack. ? ?The current 30 day co-pay is, $38.00.  ? ?The patient is insured through [employer] [facility] the only one who accepts Tricare)  ? ? ? ?Roland Earl, CPhT ?Pharmacy Patient Advocate Specialist ?[facility] Patient Advocate Team ?Direct Number: [phone]  Fax: [phone] ? ? ? ? ? ?  ?

## 2021-06-13 NOTE — TOC Progression Note (Signed)
Transition of Care (TOC) - Progression Note  ? ? ?Patient Details  ?Name: Jenna Dudley ?MRN: [id number] ?Date of Birth: 08-16-1935 ? ?Transition of Care (TOC) CM/SW Contact  ?Hetty Ely, RN ?Phone Number: ?06/13/2021, 2:20 PM ? ?Clinical Narrative:  Called patient's husband to discuss bedoffer with [facility]. Husband did research and chose [facility]. [facility] Tammy notified.  ? ? ? ?  ?  ? ?Expected Discharge Plan and Services ?  ?  ?  ?  ?  ?                ?  ?  ?  ?  ?  ?  ?  ?  ?  ?  ? ? ?Social Determinants of Health (SDOH) Interventions ?  ? ?Readmission Risk Interventions ?No flowsheet data found. ? ?

## 2021-06-13 NOTE — Progress Notes (Signed)
? ?[facility] ?PROGRESS NOTE ? ? ?ICYSS SKOG TDH:[id number] DOB: [birth year] DOA: 06/01/2021  11 ?DOS: the patient was seen and examined on 06/13/2021 ? ?PCP: Aderoju, Jadene Pierini, MD ? ?Brief History and Hospital Course:  ?Jenna Dudley is a pleasant 86 y.o. female with medical history significant for chronic diastolic CHF, GERD, bipolar disorder, history of CVA, and right third toe wound, now presenting to the emergency department for evaluation of chest pain. ?In the hospital, she had heart rate of 114, resp irate 28, lactic acid 4.0.  CTA chest showed segmental PEs, right side opacity with a fluid level.  Also showed additional groundglass opacities, thickened esophagus wall.  And moderate to large right-sided pleural effusion.  Patient was given IV fluids, lactic acid level has dropped down to normal.  She is also started on antibiotics with Unasyn and vancomycin. ?Patient also has a left third toe osteomyelitis peripheral vascular disease.  Angioplasty and stent placement was performed on 3/13.  Underwent toe amputation on 3/14.   ?Experienced drop in platelet counts raising concern for HIT.  Patient gradually improving. ? ?Consultants: Podiatry.  Vascular surgery.  Pulmonology ? ?Procedures:  ? ?Toe amputation on the right, 3/14. ? ?3/13 ?Aortogram and selective right lower extremity angiogram ?Percutaneous transluminal angioplasty of right peroneal artery and tibioperoneal trunk with 3 mm diameter angioplasty balloon ?Percutaneous transluminal angioplasty of right distal SFA and popliteal arteries with 5 mm diameter by 30 cm length Lutonix drug-coated angioplasty balloon ?Viabahn stent placement to the distal right SFA and popliteal arteries with 6 mm diameter by 25 cm length stent for greater than 50% stenosis after angioplasty ? ? ?Subjective: ?Patient mentions that she is feeling a little bit more short of breath this morning.  Denies any cough.  No chest pain.  Overall she feels well.    ? ? ? ?Assessment/Plan: ? ? ?* Aspiration pneumonia ([place]) ?Patient seen by speech therapy.  On dysphagia 1 diet.  Completed course of Unasyn.  Respiratory status has been stable.  Low saturations noted yesterday.  Had to be placed on oxygen.  We will repeat chest x-ray today. ?There could be an element of fluid overload.  She is noted to be positive for greater than 8 L.  We will give her a dose of Lasix. ? ?Pleural effusion on right ?Status post thoracentesis with removing 1.2 L.  Lab study showed 79% neutrophils.  Cultures are negative. ?Glucose high, protein low.  LDH 61.  Appears to be transudate. ? ?Necrotizing pneumonia ([place]) ?Patient seen by pulmonology.  Completed course of antibiotics. ?Blood culture: 1/4 bottle blood culture positive for Corynebacterium.  This is a contaminant. ? ?Severe sepsis ([place]) ?Patient met severe sepsis criteria with tachycardia and tachypnea.  Lactic acid level of 4.0.  This is secondary to pneumonia and toe osteomyelitis. ?Patient is now clinically stable. ? ?Toe osteomyelitis, right ([place]) ?Patient evaluated by podiatry. ?Duplex ultrasound showed peripheral arterial disease. ?MRI showed third toe osteomyelitis.  Patient has had vascular intervention with angioplasty, stent placement on 3/13.  ?Underwent right toe amputation on 3/14. ?Bone/wound cultures positive for MRSA.  Sensitivities reviewed.  Patient changed over to doxycycline, to be given for 14 days, ending on March 31.   ? ?PAD (peripheral artery disease) ([place]) ?Duplex ultrasound showed peripheral arterial disease, MRI showed third toe osteomyelitis.  Patient has had vascular intervention with angioplasty, stent placement on 3/13.  ?Patient remains on Plavix and statin. ? ?Pulmonary emboli ([place]) ?Patient was initially placed on IV heparin.  Platelet counts noted to drop significantly.  More than 50% decrease.  See below.   ?Switched over to argatroban.   ?Counts have improved.  SRA is pending.   ?Patient was transitioned  to apixaban.  Seems to be tolerating it well. ? ?Thrombocytopenia ([place]) ?Significant drop in platelet count noted.  More than 50% drop.  ?Discussed with pharmacy.  HIT antibodies ordered.  Patient was transitioned over to argatroban.   ?Platelet counts have rebounded.   ?Heparin-induced platelet antibody level is 0.823 OD.  This is a indeterminate diagnosis with a 4T score of 6.  In view of this a functional assay, SRA, was ordered.  Results are pending. ?Platelet counts are now normal.  Transitioned to apixaban.  Stable. ? ?Anorexia ?Continues to have very poor appetite.  Started on megestrol.  Patient told importance of eating and drinking.  Continue with nutritional supplements.   ?Palliative Care is following and discussing with patient's husband regarding feeding tube.  Husband is hopeful of improvement.  He has not wanted a feeding tube.   ?Oral intake appears to have improved.  Discussed with husband yesterday who mentioned that she has been doing much better over the last 48 hours. ? ?Anemia of chronic disease ?Hemoglobin has been low.  Dropped to 7.1 was noted on 3/16.  No overt bleeding noted.  Transfused 1 unit of PRBC.  ?Hemoglobin responded and noted to be stable.  ?Was also given B12 injections during this hospital stay.  B12 level was 377.   ? ?Bipolar 1 disorder ([place]) ?Seroquel held due to lethargy.  Mentation has improved.  Continue to monitor. ?Home medication list was reviewed.  Started back on mirtazapine at low-dose which may help her appetite. ? ?Delirium with dementia ?Seems to be better. ? ?Protein-calorie malnutrition, severe ?Continue nutritional supplements.  On megestrol to stimulate her appetite.  Patient encouraged to eat her meals.  Oral intake has improved. ? ?Chest pain ?Secondary to PE.  None currently. ? ?Elevated troponin ?[facility] troponin was 108, dropped down to 94.  This is secondary to sepsis and PE. ? ?Hypokalemia ?Repleted. ? ? ? ?DVT Prophylaxis: Apixaban ?Code Status: Full  code ?Family Communication: No family at bedside.  Husband updated yesterday. ?Disposition Plan: Skilled nursing facility is recommended ? ?Status is: Inpatient ?Remains inpatient appropriate because: Osteomyelitis right toe, peripheral artery disease, PE, thrombocytopenia ? ? ? ? ?Medications: Scheduled: ? (feeding supplement) PROSource Plus  30 mL Oral TID BM  ? apixaban  10 mg Oral BID  ? Followed by  ? [START ON 06/18/2021] apixaban  5 mg Oral BID  ? vitamin C  500 mg Oral BID  ? clopidogrel  75 mg Oral Daily  ? doxycycline  100 mg Oral Q12H  ? feeding supplement  1 Container Oral TID BM  ? mouth rinse  15 mL Mouth Rinse BID  ? megestrol  400 mg Oral Daily  ? metoprolol succinate  25 mg Oral Daily  ? mirtazapine  7.5 mg Oral QHS  ? multivitamin with minerals  1 tablet Oral Daily  ? pantoprazole  40 mg Oral Daily  ? simvastatin  40 mg Oral QHS  ? sodium chloride flush  3 mL Intravenous Q12H  ? sodium chloride flush  3 mL Intravenous Q12H  ? sucralfate  1 g Oral TID WC & HS  ? zinc sulfate  220 mg Oral Daily  ? ?Continuous: ? sodium chloride Stopped (06/04/21 1324)  ? sodium chloride    ? ?HFW:YOVZCH chloride, sodium chloride,  acetaminophen **OR** acetaminophen, albuterol, ALPRAZolam, benzocaine, diphenhydrAMINE, morphine injection, ondansetron **OR** ondansetron (ZOFRAN) IV, ondansetron (ZOFRAN) IV, oxyCODONE, polyethylene glycol, sodium chloride flush ? ?Antibiotics: ?Anti-infectives (From admission, onward)  ? ? Start     Dose/Rate Route Frequency Ordered Stop  ? 06/10/21 1215  doxycycline (VIBRA-TABS) tablet 100 mg       ? 100 mg Oral Every 12 hours 06/10/21 1124 06/24/21 0959  ? 06/07/21 0900  Ampicillin-Sulbactam (UNASYN) 3 g in sodium chloride 0.9 % 100 mL IVPB  Status:  Discontinued       ? 3 g ?200 mL/hr over 30 Minutes Intravenous Every 6 hours 06/07/21 0812 06/10/21 1124  ? 06/06/21 1300  ceFAZolin (ANCEF) IVPB 2g/100 mL premix       ? 2 g ?200 mL/hr over 30 Minutes Intravenous  Once 06/06/21 1249  06/06/21 1354  ? 06/04/21 2100  vancomycin (VANCOCIN) IVPB 1000 mg/200 mL premix       ? 1,000 mg ?200 mL/hr over 60 Minutes Intravenous Every 36 hours 06/04/21 1104 06/08/21 0730  ? 06/03/21 0900  vancomycin (VAN

## 2021-06-13 NOTE — Progress Notes (Signed)
Occupational Therapy Treatment ?Patient Details ?Name: Jenna Dudley ?MRN: [id number] ?DOB: [birth year] ?Today's Date: 06/13/2021 ? ? ?History of present illness Patient is an 86 year old female with a PMH (+) for chronic diastolic CHF, GERD, bipolar disorder, history of CVA, and right third toe wound. Patient presented to [facility] ED on 06/01/21 for chest pain, and was admitted for Aspiration pneumonia. s/p R 3rd toe amputation 06/07/21 ?  ?OT comments ? Pt seen for OT treatment on this date. Upon arrival to room, pt asleep in bed however easily awoken. Pt reported urge to have BM. Pt required MIN GUARD for bed mobility, MIN A for stand pivot transfer to/from [facility], and MOD A for sit>stand peri-care d/t decreased strength, balance, and activity tolerance. Pt left sitting upright in recliner, with all needs within reach. Pt on 2L/min during session, with SpO2>95% throughout. Pt is making good progress toward goals, however requires significant more assist for ADLs/functional mobility compared to baseline. Discharge recommendation updated to SNF. Will continue to follow POC.  ? ?Recommendations for follow up therapy are one component of a multi-disciplinary discharge planning process, led by the attending physician.  Recommendations may be updated based on patient status, additional functional criteria and insurance authorization. ?   ?Follow Up Recommendations ? Skilled nursing-short term rehab (<3 hours/day)  ?  ?Assistance Recommended at Discharge Frequent or constant Supervision/Assistance  ?Patient can return home with the following ? A little help with walking and/or transfers;Assistance with cooking/housework;Direct supervision/assist for financial management;Assist for transportation;Direct supervision/assist for medications management;A lot of help with bathing/dressing/bathroom;Assistance with feeding ?  ?Equipment Recommendations ? None recommended by OT  ?  ?   ?Precautions / Restrictions Precautions ?Precautions:  Fall ?Precaution Comments: pt is impulsive ?Restrictions ?Weight Bearing Restrictions: No ?RLE Weight Bearing: Partial weight bearing ?Other Position/Activity Restrictions: per podiatrist, minimal weight bearing on R LE for transitions only  ? ? ?  ? ?Mobility Bed Mobility ?Overal bed mobility: Needs Assistance ?Bed Mobility: Supine to Sit, Sit to Supine ?  ?  ?Supine to sit: Min guard, HOB elevated ?Sit to supine: Min guard, HOB elevated ?  ?General bed mobility comments: With HOB elevated, required no physical assistance, but required MIN GUARD d/t impulsivity ?  ? ?Transfers ?Overall transfer level: Needs assistance ?Equipment used: Rolling walker (2 wheels) ?Transfers: Bed to chair/wheelchair/BSC ?Sit to Stand: Min assist ?Stand pivot transfers: Min assist ?  ?  ?  ?  ?  ?  ?  ?Balance Overall balance assessment: Needs assistance ?Sitting-balance support: Single extremity supported, Feet supported ?Sitting balance-Leahy Scale: Fair ?  ?  ?Standing balance support: Single extremity supported, During functional activity ?Standing balance-Leahy Scale: Fair ?Standing balance comment: Requires MIN GUARD during standing peri-care ?  ?  ?  ?  ?  ?  ?  ?  ?  ?  ?  ?   ? ?ADL either performed or assessed with clinical judgement  ? ?ADL Overall ADL's : Needs assistance/impaired ?  ?  ?  ?  ?  ?  ?  ?  ?  ?  ?  ?  ?Toilet Transfer: Minimal assistance;Stand-pivot;BSC/3in1;Rolling walker (2 wheels) ?Toilet Transfer Details (indicate cue type and reason): Requires cues for positioning body with BSC ?Toileting- Clothing Manipulation and Hygiene: Moderate assistance;Sit to/from stand ?Toileting - Clothing Manipulation Details (indicate cue type and reason): Able to initiate posterior peri-care following BM, however fatigues quickly, requiring therapist to complete task ?  ?  ?  ?  ?  ? ? ? ?  Cognition Arousal/Alertness: Awake/alert ?Behavior During Therapy: Anxious ?Overall Cognitive Status: No family/caregiver present to  determine baseline cognitive functioning ?  ?  ?  ?  ?  ?  ?  ?  ?  ?  ?  ?  ?  ?  ?  ?  ?General Comments: Pt disoriented to situation and anxious regarding all mobility. Inconsistently follows 1-step instructions ?  ?  ?   ?   ?   ?   ? ? ?Pertinent Vitals/ Pain       Pain Assessment ?Pain Assessment: Faces ?Faces Pain Scale: Hurts little more ?Pain Location: generalized pain in all joints ?Pain Descriptors / Indicators: Aching, Discomfort ?Pain Intervention(s): Monitored during session, Repositioned, Patient requesting pain meds-RN notified ? ?   ?   ? ?Frequency ? Min 2X/week  ? ? ? ? ?  ?Progress Toward Goals ? ?OT Goals(current goals can now be found in the care plan section) ? Progress towards OT goals: Progressing toward goals ? ?Acute Rehab OT Goals ?Patient Stated Goal: to have less pain ?OT Goal Formulation: With patient ?Time For Goal Achievement: 06/19/21 ?Potential to Achieve Goals: Fair  ?Plan Discharge plan needs to be updated;Frequency remains appropriate   ? ?   ?AM-PAC OT "6 Clicks" Daily Activity     ?Outcome Measure ? ? Help from another person eating meals?: A Lot ?Help from another person taking care of personal grooming?: A Lot ?Help from another person toileting, which includes using toliet, bedpan, or urinal?: A Lot ?Help from another person bathing (including washing, rinsing, drying)?: A Lot ?Help from another person to put on and taking off regular upper body clothing?: A Little ?Help from another person to put on and taking off regular lower body clothing?: A Lot ?6 Click Score: 13 ? ?  ?End of Session Equipment Utilized During Treatment: Rolling walker (2 wheels) ? ?OT Visit Diagnosis: Muscle weakness (generalized) (M62.81) ?  ?Activity Tolerance Patient tolerated treatment well ?  ?Patient Left in chair;with call bell/phone within reach;with chair alarm set ?  ?Nurse Communication Mobility status ?  ? ?   ? ?Time: 0258-5277 ?OT Time Calculation (min): 27 min ? ?Charges: OT General  Charges ?$OT Visit: 1 Visit ?OT Treatments ?$Self Care/Home Management : 23-37 mins ? ?Matthew Folks, OTR/L ?ASCOM [phone] ? ?

## 2021-06-14 ENCOUNTER — Ambulatory Visit: Payer: Medicare Other | Admitting: Family

## 2021-06-14 DIAGNOSIS — J690 Pneumonitis due to inhalation of food and vomit: Secondary | ICD-10-CM | POA: Diagnosis not present

## 2021-06-14 DIAGNOSIS — Z7189 Other specified counseling: Secondary | ICD-10-CM | POA: Diagnosis not present

## 2021-06-14 DIAGNOSIS — I2699 Other pulmonary embolism without acute cor pulmonale: Secondary | ICD-10-CM | POA: Diagnosis not present

## 2021-06-14 LAB — CBC
HCT: 28.5 % — ABNORMAL LOW (ref 36.0–46.0)
Hemoglobin: 8.9 g/dL — ABNORMAL LOW (ref 12.0–15.0)
MCH: 34.1 pg — ABNORMAL HIGH (ref 26.0–34.0)
MCHC: 31.2 g/dL (ref 30.0–36.0)
MCV: 109.2 fL — ABNORMAL HIGH (ref 80.0–100.0)
Platelets: 173 10*3/uL (ref 150–400)
RBC: 2.61 MIL/uL — ABNORMAL LOW (ref 3.87–5.11)
RDW: 22 % — ABNORMAL HIGH (ref 11.5–15.5)
WBC: 8.1 10*3/uL (ref 4.0–10.5)
nRBC: 0 % (ref 0.0–0.2)

## 2021-06-14 LAB — BASIC METABOLIC PANEL
Anion gap: 5 (ref 5–15)
BUN: 22 mg/dL (ref 8–23)
CO2: 29 mmol/L (ref 22–32)
Calcium: 8.2 mg/dL — ABNORMAL LOW (ref 8.9–10.3)
Chloride: 108 mmol/L (ref 98–111)
Creatinine, Ser: 0.79 mg/dL (ref 0.44–1.00)
GFR, Estimated: >60 mL/min (ref >60)
Glucose, Bld: 135 mg/dL — ABNORMAL HIGH (ref 70–99)
Potassium: 3.4 mmol/L — ABNORMAL LOW (ref 3.5–5.1)
Sodium: 142 mmol/L (ref 135–145)

## 2021-06-14 LAB — SEROTONIN RELEASE ASSAY (SRA)
SRA .2 IU/mL UFH Ser-aCnc: <1 % (ref 0–20)
SRA .2 IU/mL UFH Ser-aCnc: 2 % (ref 0–20)
SRA 100IU/mL UFH Ser-aCnc: <1 % (ref 0–20)
SRA 100IU/mL UFH Ser-aCnc: 5 % (ref 0–20)

## 2021-06-14 LAB — MAGNESIUM: Magnesium: 1.4 mg/dL — ABNORMAL LOW (ref 1.7–2.4)

## 2021-06-14 LAB — GLUCOSE, CAPILLARY: Glucose-Capillary: 86 mg/dL (ref 70–99)

## 2021-06-14 MED ORDER — MAGNESIUM OXIDE -MG SUPPLEMENT 400 (240 MG) MG PO TABS
400.0000 mg | ORAL_TABLET | Freq: Two times a day (BID) | ORAL | Status: DC
  Administered 2021-06-14: 400 mg via ORAL
  Filled 2021-06-14: qty 1

## 2021-06-14 MED ORDER — MAGNESIUM OXIDE -MG SUPPLEMENT 400 (240 MG) MG PO TABS: 400.0000 mg | ORAL_TABLET | Freq: Two times a day (BID) | ORAL | Status: AC

## 2021-06-14 MED ORDER — MAGNESIUM SULFATE 4 GM/100ML IV SOLN
4.0000 g | Freq: Once | INTRAVENOUS | Status: AC
  Administered 2021-06-14: 4 g via INTRAVENOUS
  Filled 2021-06-14: qty 100

## 2021-06-14 MED ORDER — FUROSEMIDE 20 MG PO TABS: 20.0000 mg | ORAL_TABLET | Freq: Every day | ORAL | 5 refills | Status: DC

## 2021-06-14 MED ORDER — POTASSIUM CHLORIDE CRYS ER 20 MEQ PO TBCR: 20.0000 meq | EXTENDED_RELEASE_TABLET | Freq: Every day | ORAL | Status: DC

## 2021-06-14 MED ORDER — POTASSIUM CHLORIDE CRYS ER 20 MEQ PO TBCR
40.0000 meq | EXTENDED_RELEASE_TABLET | Freq: Once | ORAL | Status: AC
  Administered 2021-06-14: 40 meq via ORAL
  Filled 2021-06-14: qty 2

## 2021-06-14 MED ORDER — APIXABAN 5 MG PO TABS: ORAL_TABLET | ORAL | Status: AC

## 2021-06-14 NOTE — Care Management Important Message (Signed)
Important Message ? ?Patient Details  ?Name: Jenna Dudley ?MRN: [id number] ?Date of Birth: 15-Jan-1936 ? ? ?Medicare Important Message Given:  Yes ? ?Reviewed Medicare IM with Armanda Heritage, husband, at [phone].  Confirmed he received the copy of the Medicare IM mailed by Patient Access.  Declines another copy at this time.   ? ? ?[name] ?06/14/2021, 9:22 AM ?

## 2021-06-14 NOTE — Discharge Summary (Signed)
?[facility] ? ?Physician Discharge Summary  ? ?Patient ID: ?Jenna Dudley ?MRN: [id number] ?DOB/AGE: [birth year] 86 y.o. ? ?Admit date: 06/01/2021 ?Discharge date:   06/14/2021 ? ? ?PCP: Aderoju, Jadene Pierini, MD ? ?DISCHARGE DIAGNOSES:  ?Principal Problem: ?  Aspiration pneumonia ([place]) ?Active Problems: ?  Severe sepsis ([place]) ?  Necrotizing pneumonia ([place]) ?  Pleural effusion on right ?  Toe osteomyelitis, right ([place]) ?  Pulmonary emboli ([place]) ?  PAD (peripheral artery disease) ([place]) ?  Thrombocytopenia ([place]) ?  Anorexia ?  Anemia of chronic disease ?  Bipolar 1 disorder ([place]) ?  Delirium with dementia ?  Protein-calorie malnutrition, severe ?  History of stroke ?  Hypokalemia ?  Elevated troponin ?  Chest pain ?  Hypomagnesemia ? ? ?RECOMMENDATIONS FOR OUTPATIENT FOLLOW UP: ?Palliative care to follow at skilled nursing facility ?Patient is follow-up with podiatry, Dr. Amalia Hailey in 1 week for right foot ?Needs follow-up with Dr. Sharol Given with vascular surgery in 2 to 3 weeks ?Please check CBC and basic metabolic panel in 3 to 4 days ? ? ?Home Health: Going to SNF ?Equipment/Devices: None ? ?CODE STATUS: Full code ? ?DISCHARGE CONDITION: fair ? ?Diet recommendation: Dysphagia 3 diet with thin liquids ? ?INITIAL HISTORY: ?Jenna Dudley is a pleasant 86 y.o. female with medical history significant for chronic diastolic CHF, GERD, bipolar disorder, history of CVA, and right third toe wound, now presenting to the emergency department for evaluation of chest pain. ?In the hospital, she had heart rate of 114, resp irate 28, lactic acid 4.0.  CTA chest showed segmental PEs, right side opacity with a fluid level.  Also showed additional groundglass opacities, thickened esophagus wall.  And moderate to large right-sided pleural effusion.  Patient was given IV fluids, lactic acid level has dropped down to normal.  She is also started on antibiotics with Unasyn and vancomycin. ?Patient also has a left third toe osteomyelitis  peripheral vascular disease.  Angioplasty and stent placement was performed on 3/13.  Underwent toe amputation on 3/14.   ?Experienced drop in platelet counts raising concern for HIT.  Patient gradually improving. ? ?Consultants: Podiatry.  Vascular surgery.  Pulmonology ?  ?Procedures:  ?  ?Toe amputation on the right, 3/14. ?  ?3/13 ?Aortogram and selective right lower extremity angiogram ?Percutaneous transluminal angioplasty of right peroneal artery and tibioperoneal trunk with 3 mm diameter angioplasty balloon ?Percutaneous transluminal angioplasty of right distal SFA and popliteal arteries with 5 mm diameter by 30 cm length Lutonix drug-coated angioplasty balloon ?Viabahn stent placement to the distal right SFA and popliteal arteries with 6 mm diameter by 25 cm length stent for greater than 50% stenosis after angioplasty ?  ? ?HOSPITAL COURSE:  ? ? ?Assessment and Plan: ?* Aspiration pneumonia ([place]) ?Patient seen by speech therapy.  On dysphagia 3 diet.   ?Completed course of Unasyn.  Respiratory status has been stable.  Developed mild fluid overload yesterday for which she was given Lasix with improvement.  Continue oral Lasix for few more days.   ? ?Pleural effusion on right ?Status post thoracentesis with removing 1.2 L.  Lab study showed 79% neutrophils.  Cultures are negative. ?Glucose high, protein low.  LDH 61.  Appears to be transudate. ? ?Necrotizing pneumonia ([place]) ?Patient seen by pulmonology.  Completed course of antibiotics. ?Blood culture: 1/4 bottle blood culture positive for Corynebacterium.  This is a contaminant. ? ?Severe sepsis ([place]) ?Patient met severe sepsis criteria with tachycardia and tachypnea.  Lactic acid level of 4.0.  This is secondary to pneumonia and toe osteomyelitis. ?Patient is now clinically stable. ? ?Toe osteomyelitis, right ([place]) ?Patient evaluated by podiatry. ?Duplex ultrasound showed peripheral arterial disease. ?MRI showed third toe osteomyelitis.  Patient has had  vascular intervention with angioplasty, stent placement on 3/13.  ?Underwent right toe amputation on 3/14. ?Bone/wound cultures positive for MRSA.  Sensitivities reviewed.  Patient changed over to doxycycline, to be given for 14 days, ending on March 31.   ? ?PAD (peripheral artery disease) ([place]) ?Duplex ultrasound showed peripheral arterial disease, MRI showed third toe osteomyelitis.  Patient has had vascular intervention with angioplasty, stent placement on 3/13.  ?Patient remains on Plavix and statin. ? ?Pulmonary emboli ([place]) ?Patient was initially placed on IV heparin.  Platelet counts noted to drop significantly.  More than 50% decrease.  See below.   ?Switched over to argatroban.   ?Counts have improved.  SRA is pending.   ?Patient was transitioned to apixaban.  Seems to be tolerating it well. ? ?Thrombocytopenia ([place]) ?Significant drop in platelet count noted.  More than 50% drop.  ?Discussed with pharmacy.  HIT antibodies ordered.  Patient was transitioned over to argatroban.   ?Platelet counts have rebounded.   ?Heparin-induced platelet antibody level is 0.823 OD.  This is a indeterminate diagnosis with a 4T score of 6.  In view of this a functional assay, SRA, was ordered.  Results are pending. ?Platelet counts are now normal.  Transitioned to apixaban.  Stable. ? ?Anorexia ?Continues to have very poor appetite.  Started on megestrol.  Patient told importance of eating and drinking.  Continue with nutritional supplements.   ?Palliative Care is following and discussing with patient's husband regarding feeding tube.  Husband is hopeful of improvement.  He has not wanted a feeding tube.   ?Oral intake appears to have improved.  Could consider discontinuing megestrol after 1 week if oral intake remains good. ? ?Anemia of chronic disease ?Hemoglobin has been low.  Dropped to 7.1 was noted on 3/16.  No overt bleeding noted.  Transfused 1 unit of PRBC.  ?Hemoglobin responded and noted to be stable.  ?Was also  given B12 injections during this hospital stay.  B12 level was 377.   ? ?Bipolar 1 disorder ([place]) ?Seroquel held due to lethargy.  Mentation has improved.  Continue to monitor. ?Home medication list was reviewed.  Started back on mirtazapine at low-dose which may help her appetite. ? ?Delirium with dementia ?Seems to be better. ? ?Protein-calorie malnutrition, severe ?Continue nutritional supplements.  On megestrol to stimulate her appetite.  Patient encouraged to eat her meals.  Oral intake has improved. ? ?Hypomagnesemia ?Magnesium to be repleted today prior to discharge.  We will discharge her on magnesium oxide. ? ?Chest pain ?Secondary to PE.  None currently. ? ?Elevated troponin ?[facility] troponin was 108, dropped down to 94.  This is secondary to sepsis and PE. ? ?Hypokalemia ?Repleted. ? ? ?Patient is stable.  Discussed with patient and husband.  Okay for discharge to SNF today. ? ? ?PERTINENT LABS: ? ?The results of significant diagnostics from this hospitalization (including imaging, microbiology, ancillary and laboratory) are listed below for reference.   ? ?Microbiology: ?Recent Results (from the past 240 hour(s))  ?MRSA Next Gen by PCR, Nasal     Status: None  ? Collection Time: 06/06/21  7:40 AM  ? Specimen: Nasal Mucosa; Nasal Swab  ?Result Value Ref Range Status  ? MRSA by PCR Next Gen NOT DETECTED NOT DETECTED Final  ?  Comment: (NOTE) ?The GeneXpert MRSA Assay (FDA approved for NASAL specimens only), ?is one component of a comprehensive MRSA colonization surveillance ?program. It is not intended to diagnose MRSA infection nor to guide ?or monitor treatment for MRSA infections. ?Test performance is not FDA approved in patients less than 2 years ?old. ?Performed at [facility], [place], ?[place] [address] ?  ?Aerobic/Anaerobic Culture w Gram Stain (surgical/deep wound)     Status: None  ? Collection Time: 06/07/21  6:23 PM  ? Specimen: PATH Digit amputation; Tissue  ?Result  Value Ref Range Status  ? Specimen Description   Final  ?  TOE RIGHT THIRD RAY BONE CULTURE ?Performed at [facility], [address] [place], [place] [address] ?  ? Special Requests   Final  ?  N

## 2021-06-14 NOTE — Progress Notes (Signed)
? ?                                                                                                                                                     ?                                                   ?Daily Progress Note  ? ?Patient Name: Jenna Dudley       Date: 06/14/2021 ?DOB: 02/03/1936  Age: 86 y.o. MRN#: [id number] ?Attending Physician: Bonnielee Haff, MD ?Primary Care Physician: Jovita Kussmaul, MD ?Admit Date: 06/01/2021 ? ?Reason for Consultation/Follow-up: Establishing goals of care ? ?Subjective: ?Notes reviewed. Patient is resting in bed with eyes closed. She opens them briefly and closes them. No family at bedside. Per nursing, her appetite has improved. Husband stated previously she will have periods of not eating followed by increased intake. She is working with therapy, and TOC is working on SNF placement. Recommend outpatient palliative to follow.   ? ?Length of Stay: 12 ? ?Current Medications: ?Scheduled Meds:  ? (feeding supplement) PROSource Plus  30 mL Oral TID BM  ? apixaban  10 mg Oral BID  ? Followed by  ? [START ON 06/18/2021] apixaban  5 mg Oral BID  ? vitamin C  500 mg Oral BID  ? clopidogrel  75 mg Oral Daily  ? doxycycline  100 mg Oral Q12H  ? feeding supplement  1 Container Oral TID BM  ? magnesium oxide  400 mg Oral BID  ? mouth rinse  15 mL Mouth Rinse BID  ? megestrol  400 mg Oral Daily  ? metoprolol succinate  25 mg Oral Daily  ? mirtazapine  7.5 mg Oral QHS  ? multivitamin with minerals  1 tablet Oral Daily  ? pantoprazole  40 mg Oral Daily  ? simvastatin  40 mg Oral QHS  ? sodium chloride flush  3 mL Intravenous Q12H  ? sodium chloride flush  3 mL Intravenous Q12H  ? sucralfate  1 g Oral TID WC & HS  ? zinc sulfate  220 mg Oral Daily  ? ? ?Continuous Infusions: ? sodium chloride Stopped (06/04/21 1324)  ? sodium chloride    ? magnesium sulfate bolus IVPB 4 g (06/14/21 1007)  ? ? ?PRN Meds: ?sodium chloride, sodium chloride, acetaminophen **OR** acetaminophen,  albuterol, ALPRAZolam, benzocaine, diphenhydrAMINE, morphine injection, ondansetron **OR** ondansetron (ZOFRAN) IV, ondansetron (ZOFRAN) IV, oxyCODONE, polyethylene glycol, sodium chloride flush ? ?Physical Exam ?Constitutional:   ?   Comments: [place].   ?Pulmonary:  ?   Effort: Pulmonary effort is normal.  ?Skin: ?   General: Skin is warm and dry.  ?         ? ?  Vital Signs: BP 130/65 (BP Location: Right Arm)   Pulse 70   Temp 97.9 ?F (36.6 ?C) (Axillary)   Resp 17   Ht 5' (1.524 m)   Wt 53.9 kg   LMP  (LMP Unknown)   SpO2 100%   BMI 23.21 kg/m?  ?SpO2: SpO2: 100 % ?O2 Device: O2 Device: Nasal Cannula ?O2 Flow Rate: O2 Flow Rate (L/min): 3 L/min ? ?Intake/output summary:  ?Intake/Output Summary (Last 24 hours) at 06/14/2021 1027 ?Last data filed at 06/13/2021 2118 ?Gross per 24 hour  ?Intake 717 ml  ?Output 700 ml  ?Net 17 ml  ? ?LBM: Last BM Date : 06/14/21 ?Baseline Weight: Weight: 51.6 kg ?Most recent weight: Weight: 53.9 kg ? ?  ? ?Patient Active Problem List  ? Diagnosis Date Noted  ? Thrombocytopenia ([place]) 06/08/2021  ? PAD (peripheral artery disease) ([place]) 06/08/2021  ? Protein-calorie malnutrition, severe 06/07/2021  ? Anorexia 06/04/2021  ? Aspiration pneumonia ([place]) 06/02/2021  ? Pulmonary emboli ([place]) 06/02/2021  ? Elevated troponin 06/02/2021  ? Chest pain 06/02/2021  ? Hypomagnesemia 06/02/2021  ? Pleural effusion on right 06/02/2021  ? Anemia of chronic disease 06/02/2021  ? Necrotizing pneumonia ([place])   ? Sepsis ([place])   ? COVID-19 virus infection 04/11/2021  ? Toe osteomyelitis, right ([place]) 04/10/2021  ? Elevated lactic acid level 04/10/2021  ? Hypokalemia 04/10/2021  ? CKD (chronic kidney disease), stage IIIa 04/10/2021  ? Iron deficiency anemia 04/10/2021  ? CHF exacerbation ([place]) 12/10/2020  ? Delirium with dementia   ? HCAP (healthcare-associated pneumonia) 12/18/2017  ? Bipolar 1 disorder ([place]) 05/29/2017  ? CHF (congestive heart failure) ([place]) 04/23/2017  ? Accelerated hypertension  04/21/2017  ? Severe sepsis ([place]) 10/03/2015  ? Hypernatremia 10/03/2015  ? Lactic acidosis 10/03/2015  ? Leukocytosis 10/03/2015  ? Agitation 10/03/2015  ? Severe recurrent major depression without psychotic features ([place])   ? Major depressive disorder, recurrent episode, severe, with psychotic behavior ([place]) 10/13/2014  ? Severe episode of recurrent major depressive disorder, with psychotic features ([place]) 10/13/2014  ? 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 Essential hypertension 09/17/2014  ? Chronic back pain 09/17/2014  ? History of stroke 09/17/2014  ? Severe recurrent major depression with psychotic features ([place])   ? Acute on chronic diastolic CHF (congestive heart failure) ([place]) 07/29/2014  ? Partial small bowel obstruction ([place]) 07/29/2014  ? Hallux abductovalgus with bunions 09/12/2013  ? Hammer toe 09/12/2013  ? Foot pain 09/12/2013  ? Fungal infection of nail 09/12/2013  ? Carpal tunnel syndrome 12/21/2010  ? Chronic low back pain 12/21/2010  ? Degenerative arthritis of lumbar spine 12/21/2010  ? Depression 12/21/2010  ? 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 HLD (hyperlipidemia) 12/21/2010  ? Acid reflux 12/21/2010  ? Spinal stenosis 12/21/2010  ? ? ?Palliative Care Assessment & Plan  ? ?Recommendations/Plan: ?Recommend palliative to follow outpatient.  ? ?Code Status: ? ?  ?Code Status Orders  ?(From admission, onward)  ?  ? ? ?  ? ?  Start     Ordered  ? 06/02/21 0436  Full code  Continuous       ? 06/02/21 0440  ? ?  ?  ? ?  ? ?Code Status History   ? ? Date Active Date Inactive Code Status Order ID Comments User Context  ? 04/10/2021 0911 04/14/2021 1703 Full Code [id number]  Ivor Costa, MD ED  ? 12/10/2020 2342 12/17/2020 1737 Full Code [id number]  Athena Masse, MD ED  ?  12/19/2017 0228 12/25/2017 1831 Full Code [id number]  Lance Coon, MD ED  ? 04/21/2017 0921 04/23/2017 1430 Full Code [id number]  Lance Coon, MD Inpatient  ? 10/03/2015 1340 10/15/2015  2006 Full Code [id number]  Theodoro Grist, MD Inpatient  ? 09/17/2014 0301 09/26/2014 1929 Full Code [id number]  Clapacs, Madie Reno, MD Inpatient  ? ?  ? ? ?Care plan was discussed with RN ? ?Thank you for allowing the Palliative Medicine Team to assist in the care of this patient. ? ?25 min  ?Asencion Gowda, NP ? ?Please contact Palliative Medicine Team phone at [phone] for questions and concerns.  ? ? ? ? ? ?

## 2021-06-14 NOTE — Progress Notes (Signed)
Patient picked up by transportation. All belongings with husband. ?

## 2021-06-14 NOTE — TOC Transition Note (Signed)
Transition of Care (TOC) - CM/SW Discharge Note ? ? ?Patient Details  ?Name: Jenna Dudley ?MRN: [id number] ?Date of Birth: Feb 10, 1936 ? ?Transition of Care (TOC) CM/SW Contact:  ?Hetty Ely, RN ?Phone Number: ?06/14/2021, 11:49 AM ? ? ?Clinical Narrative: To discharge to [facility] [place] Room 804. Report given by Nurse. [facility] called and scheduled transport. ? ? ? ?Final next level of care: Skilled Nursing Facility ?Barriers to Discharge: Barriers Resolved ? ? ?Patient Goals and CMS Choice ?  ?  ?  ? ?Discharge Placement ?  ?           ?Patient chooses bed at: [facility]  (Room 804) ?Patient to be transferred to facility by: [facility] ?Name of family member notified: Husband, [name] ?Patient and family notified of of transfer: 06/13/21 ? ?Discharge Plan and Services ?  ?  ?           ?  ?  ?  ?  ?  ?  ?  ?  ?  ?  ? ?Social Determinants of Health (SDOH) Interventions ?  ? ? ?Readmission Risk Interventions ?No flowsheet data found. ? ? ? ? ?

## 2021-06-14 NOTE — Progress Notes (Signed)
Report give to [place] at [facility]. Awaiting transportation for pick up. ?

## 2021-06-14 NOTE — Consult Note (Signed)
Tried to educate patient on apixaban, but patient was sleeping and would not wake up. Will leave apixaban handout and 30-day free coupon with RN for discharge.  ? ?Thank you,  ?Paschal Dopp, PharmD, BCPS ?

## 2021-06-14 NOTE — Assessment & Plan Note (Signed)
Magnesium to be repleted today prior to discharge.  We will discharge her on magnesium oxide. ?

## 2021-06-15 ENCOUNTER — Telehealth: Payer: Self-pay | Admitting: Podiatry

## 2021-06-15 NOTE — Telephone Encounter (Signed)
URGENT MESSAGE-Kim from facility [facility] called asking for clarification of weight bearing status. Please call them at [phone]. ?

## 2021-06-15 NOTE — Telephone Encounter (Signed)
Please call Facility [facility] and notify... minimal WB as tolerated in a CAM boot or postop shoe.

## 2021-06-16 NOTE — Telephone Encounter (Signed)
Called facility relayed message to [facility] the [employer]. ?

## 2021-06-17 ENCOUNTER — Telehealth: Payer: Self-pay

## 2021-06-17 NOTE — Telephone Encounter (Signed)
Just FYI ?Kim with [facility] called and stated that patient took a notion to walk to bathroom by herself this morning and she took a fall.  Selena Batten said the patient did not have any injuries but wanted to let you know ?

## 2021-06-21 ENCOUNTER — Ambulatory Visit (INDEPENDENT_AMBULATORY_CARE_PROVIDER_SITE_OTHER): Payer: Medicare Other | Admitting: Podiatry

## 2021-06-21 ENCOUNTER — Other Ambulatory Visit: Payer: Self-pay

## 2021-06-21 ENCOUNTER — Non-Acute Institutional Stay: Payer: Medicare Other | Admitting: Primary Care

## 2021-06-21 ENCOUNTER — Ambulatory Visit (INDEPENDENT_AMBULATORY_CARE_PROVIDER_SITE_OTHER): Payer: Medicare Other

## 2021-06-21 VITALS — Temp 97.2°F

## 2021-06-21 DIAGNOSIS — E43 Unspecified severe protein-calorie malnutrition: Secondary | ICD-10-CM

## 2021-06-21 DIAGNOSIS — Z515 Encounter for palliative care: Secondary | ICD-10-CM

## 2021-06-21 DIAGNOSIS — Z9889 Other specified postprocedural states: Secondary | ICD-10-CM

## 2021-06-21 NOTE — Progress Notes (Signed)
 Therapist, nutritional Palliative Care Consult Note Telephone: [phone]  Fax: [phone]   Date of encounter: 06/21/21 12:46 PM PATIENT NAME: Jenna Dudley [address] [place] [address]   [phone] (home)  DOB: November 02, 1935 MRN: [id number] PRIMARY CARE PROVIDER:    Tonye Almarie Hau, MD,  [address] [place] [address] [phone]  REFERRING PROVIDER:   Tonye Almarie Hau, MD [address] [place],  [place] [address] [phone]  RESPONSIBLE PARTY:    Contact Information     Name Relation Home Work [place] Spouse [phone]  [phone]   Dudley,Jenna Daughter [phone]  [phone]        I met face to face [facility] in [facility]. Palliative Care was asked to follow this patient by consultation request of  Jenna Dudley, Almarie Curio* to address advance care planning and complex medical decision making. This is the initial visit.                                     ASSESSMENT AND PLAN / RECOMMENDATIONS:   Advance Care Planning/Goals of Care: Goals include to maximize quality of life and symptom management. Patient/health care surrogate gave his/her permission to discuss.Our advance care planning conversation included a discussion about:    Phone call to Jenna Dudley Division, not able to speak today and would like call back Identification of a healthcare agent - husband Jenna Dudley Review of an  advance directive document from SNF  CODE STATUS: FULL  I reviewed a MOST form today. The patient and family outlined their wishes for the following treatment decisions:  Cardiopulmonary Resuscitation: Attempt Resuscitation (CPR)  Medical Interventions: Limited Additional Interventions: Use medical treatment, IV fluids and cardiac monitoring as indicated, DO NOT USE intubation or mechanical ventilation. May consider use of less invasive airway support such as BiPAP or CPAP. Also provide comfort measures. Transfer to the  hospital if indicated. Avoid intensive care.   Antibiotics: Antibiotics if indicated  IV Fluids: IV fluids if indicated  Feeding Tube: No feeding tube   Symptom Management/Plan:  Met with patient today in her SNF.  She is here for rehab after debility from a fall. She is conversant but repeats herself. She is oob in chair. Foot wound is wrapped and not visualized today.  She denies pain or other complaints. I reached out to her POA but he was unable to talk today and would like to discuss her case in the future. He was en route to take her to the podiatry for review of the above foot wound. She has it in tact with ace and a platform shoe.   Of note her oxygen was on 10 L when I visited with her. Staff states orders for 3 L, so I reduced. If she needs review of respiratory sx I would refer this to NP in house for any new SOB.   Follow up Palliative Care Visit: Palliative care will continue to follow for complex medical decision making, advance care planning, and clarification of goals. Return 2-4 weeks or prn.  I spent 25 minutes providing this consultation. More than 50% of the time in this consultation was spent in counseling and care coordination.   PPS: 40%  HOSPICE ELIGIBILITY/DIAGNOSIS: TBD  Chief Complaint: debility  HISTORY OF PRESENT ILLNESS:  Jenna Dudley is a 86 y.o. year old female  with PMH chronic diastolic heart failure, GERD, bipolar  disorder, history of CVA, and right third toe wound who was recently hospitalized for aspiration pneumonia, osteomyelitis, and PE. Here for rehab and f/u from fall. Patient seen today to review palliative care needs to include medical decision making and advance care planning as appropriate.   History obtained from review of EMR, discussion with primary team, and interview with family, facility staff/caregiver and/or Jenna Dudley.  I reviewed available labs, medications, imaging, studies and related documents from the EMR.  Records reviewed and  summarized above.   ROS   General: NAD ENMT: denies dysphagia Pulmonary: denies cough, denies increased SOB Abdomen: endorses fair appetite, denies constipation, endorses incontinence of bowel GU: denies dysuria, endorses incontinence of urine MSK:  endorses  increased weakness,  no falls reported Skin: denies rashes, endorses foot wounds  Neurological: denies pain, denies insomnia Psych: Endorses anxious mood Heme/lymph/immuno: endorses bil  bruises,  +abnormal bleeding  Physical Exam: Current and past weights: 150 lbs 2 years ago, 136 lbs 1 years ago, now 117 lbs.22%loss in 2 year. Albumin 3.3 Constitutional: NAD General: frail appearing, thin EYES: anicteric sclera, lids intact, no discharge  ENMT: intact hearing, oral mucous membranes moist, dentition missing CV: S1S2, RRR, trace bil LE edema Pulmonary: LCTA, no increased work of breathing, no cough, oxygen set on 10l,  reduced to 3 L Abdomen: intake 100%, normo-active BS + 4 quadrants, soft and non tender, no ascites GU: deferred MSK: ++ sarcopenia, moves all extremities, non ambulatory Skin: warm and dry, no rashes or wounds on visible skin, Foot wrapped, reports osteomyelitis Neuro:  + generalized weakness,  + cognitive impairment Psych: anxious affect, A and O x 2 Hem/lymph/immuno: no widespread bruising  CURRENT PROBLEM LIST:  Patient Active Problem List   Diagnosis Date Noted   Thrombocytopenia (HCC) 06/08/2021   PAD (peripheral artery disease) (HCC) 06/08/2021   Protein-calorie malnutrition, severe 06/07/2021   Anorexia 06/04/2021   Aspiration pneumonia (HCC) 06/02/2021   Pulmonary emboli (HCC) 06/02/2021   Elevated troponin 06/02/2021   Chest pain 06/02/2021   Hypomagnesemia 06/02/2021   Pleural effusion on right 06/02/2021   Anemia of chronic disease 06/02/2021   Necrotizing pneumonia (HCC)    Sepsis (HCC)    COVID-19 virus infection 04/11/2021   Toe osteomyelitis, right (HCC) 04/10/2021   Elevated  lactic acid level 04/10/2021   Hypokalemia 04/10/2021   CKD (chronic kidney disease), stage IIIa 04/10/2021   Iron deficiency anemia 04/10/2021   CHF exacerbation (HCC) 12/10/2020   Delirium with dementia    HCAP (healthcare-associated pneumonia) 12/18/2017   Bipolar 1 disorder (HCC) 05/29/2017   CHF (congestive heart failure) (HCC) 04/23/2017   Accelerated hypertension 04/21/2017   Severe sepsis (HCC) 10/03/2015   Hypernatremia 10/03/2015   Lactic acidosis 10/03/2015   Leukocytosis 10/03/2015   Agitation 10/03/2015   Severe recurrent major depression without psychotic features (HCC)    Major depressive disorder, recurrent episode, severe, with psychotic behavior (HCC) 10/13/2014   Severe episode of recurrent major depressive disorder, with psychotic features (HCC) 10/13/2014   Major depressive disorder, 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 features (HCC) 10/06/2014   Essential hypertension 09/17/2014   Chronic back pain 09/17/2014   History of stroke 09/17/2014   Severe recurrent major depression with psychotic features (HCC)    Acute on chronic diastolic CHF (congestive heart failure) (HCC) 07/29/2014   Partial small bowel obstruction (HCC) 07/29/2014   Hallux abductovalgus with bunions 09/12/2013   Hammer toe 09/12/2013   Foot pain 09/12/2013   Fungal infection of nail 09/12/2013  Carpal tunnel syndrome 12/21/2010   Chronic low back pain 12/21/2010   Degenerative arthritis of lumbar spine 12/21/2010   Depression 12/21/2010   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   HLD (hyperlipidemia) 12/21/2010   Acid reflux 12/21/2010   Spinal stenosis 12/21/2010   PAST MEDICAL HISTORY:  Active Ambulatory Problems    Diagnosis Date Noted   Severe recurrent major depression with psychotic features (HCC)    Essential hypertension 09/17/2014   Chronic back pain 09/17/2014   History of stroke 09/17/2014   Carpal tunnel syndrome 12/21/2010   Chronic low back pain 12/21/2010   Degenerative  arthritis of lumbar spine 12/21/2010   Depression 12/21/2010   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   Hallux abductovalgus with bunions 09/12/2013   Major depressive disorder, 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 features (HCC) 10/06/2014   HLD (hyperlipidemia) 12/21/2010   Hammer toe 09/12/2013   Acid reflux 12/21/2010   Foot pain 09/12/2013   Fungal infection of nail 09/12/2013   Spinal stenosis 12/21/2010   Severe recurrent major depression without psychotic features (HCC)    Major depressive disorder, recurrent episode, severe, with psychotic behavior (HCC) 10/13/2014   Severe episode of recurrent major depressive disorder, with psychotic features (HCC) 10/13/2014   Severe sepsis (HCC) 10/03/2015   Hypernatremia 10/03/2015   Lactic acidosis 10/03/2015   Leukocytosis 10/03/2015   Agitation 10/03/2015   Accelerated hypertension 04/21/2017   CHF (congestive heart failure) (HCC) 04/23/2017   Bipolar 1 disorder (HCC) 05/29/2017   HCAP (healthcare-associated pneumonia) 12/18/2017   Delirium with dementia    Acute on chronic diastolic CHF (congestive heart failure) (HCC) 07/29/2014   Partial small bowel obstruction (HCC) 07/29/2014   CHF exacerbation (HCC) 12/10/2020   Toe osteomyelitis, right (HCC) 04/10/2021   Elevated lactic acid level 04/10/2021   Hypokalemia 04/10/2021   CKD (chronic kidney disease), stage IIIa 04/10/2021   Iron deficiency anemia 04/10/2021   COVID-19 virus infection 04/11/2021   Aspiration pneumonia (HCC) 06/02/2021   Pulmonary emboli (HCC) 06/02/2021   Elevated troponin 06/02/2021   Chest pain 06/02/2021   Necrotizing pneumonia (HCC)    Sepsis (HCC)    Hypomagnesemia 06/02/2021   Pleural effusion on right 06/02/2021   Anemia of chronic disease 06/02/2021   Anorexia 06/04/2021   Protein-calorie malnutrition, severe 06/07/2021   Thrombocytopenia (HCC) 06/08/2021   PAD (peripheral artery disease) (HCC) 06/08/2021   Resolved Ambulatory Problems     Diagnosis Date Noted   Infection of urinary tract 09/17/2014   Acute pyelonephritis 10/03/2015   Acute renal insufficiency 10/03/2015   Acute encephalopathy 10/04/2015   New onset of congestive heart failure (HCC) 04/21/2017   Past Medical History:  Diagnosis Date   Bilateral carpal tunnel syndrome 07/27/2014   Bipolar disorder (HCC)    Chronic diastolic CHF (congestive heart failure) (HCC)    Dementia (HCC)    Esophageal spasm 07/29/2014   Gastric reflux 09/17/2014   GERD (gastroesophageal reflux disease)    History of stress test    Hyperlipemia 07/29/2014   Hypertension    Pneumonia    SOCIAL HX:  Social History   Tobacco Use   Smoking status: Former    Types: Cigarettes    Quit date: 07/29/1975    Years since quitting: 45.9   Smokeless tobacco: Never  Substance Use Topics   Alcohol use: No    Alcohol/week: 0.0 standard drinks   FAMILY HX:  Family History  Problem Relation Age of Onset   Hypertension Mother    Stroke Mother  ALLERGIES:  Allergies  Allergen Reactions   Aspirin  Other (See Comments)    Unknown reaction   Sulfa Antibiotics Other (See Comments)    Unknown reaction   Heparin  Other (See Comments)    HIT - f/u with [facility].      PERTINENT MEDICATIONS:  Outpatient Encounter Medications as of 06/21/2021  Medication Sig   acetaminophen  (TYLENOL ) 325 MG tablet Take 2 tablets (650 mg total) by mouth every 6 (six) hours as needed for mild pain.   albuterol  (VENTOLIN  HFA) 108 (90 Base) MCG/ACT inhaler Inhale 2 puffs into the lungs every 6 (six) hours as needed for wheezing or shortness of breath.   apixaban  (ELIQUIS ) 5 MG TABS tablet Take 10mg  (2 tablets) twice daily for 4 days, then start 5mg  twice daily.   ascorbic acid  (VITAMIN C) 500 MG tablet Take 1 tablet (500 mg total) by mouth 2 (two) times daily.   benzonatate  (TESSALON  PERLES) 100 MG capsule Take 1 capsule (100 mg total) by mouth every 6 (six) hours as needed for cough.   cholecalciferol  (VITAMIN  D) 1000 units tablet Take 1,000 Units by mouth daily.   clopidogrel  (PLAVIX ) 75 MG tablet Take 1 tablet (75 mg total) by mouth daily.   doxycycline  (VIBRA -TABS) 100 MG tablet Take 1 tablet (100 mg total) by mouth every 12 (twelve) hours for 11 days.   furosemide  (LASIX ) 20 MG tablet Take 1 tablet (20 mg total) by mouth daily for 4 days.   Lactobacillus Acid-Pectin (ACIDOPHILUS/PECTIN) CAPS Take by mouth.   magnesium  oxide (MAG-OX) 400 (240 Mg) MG tablet Take 1 tablet (400 mg total) by mouth 2 (two) times daily.   magnesium  oxide (MAG-OX) 400 MG tablet Take 400 mg by mouth daily.   megestrol  (MEGACE ) 400 MG/10ML suspension Take 10 mLs (400 mg total) by mouth daily.   metoprolol  succinate (TOPROL -XL) 25 MG 24 hr tablet Take 1 tablet (25 mg total) by mouth daily.   mirtazapine  (REMERON ) 7.5 MG tablet Take 1 tablet (7.5 mg total) by mouth at bedtime.   Multiple Vitamin (MULTIVITAMIN WITH MINERALS) TABS tablet Take 1 tablet by mouth daily.   Nutritional Supplements (,FEEDING SUPPLEMENT, PROSOURCE PLUS) liquid Take 30 mLs by mouth 3 (three) times daily between meals.   omeprazole (PRILOSEC) 20 MG capsule Take 20 mg by mouth 2 (two) times daily before a meal.    oxyCODONE  (OXY IR/ROXICODONE ) 5 MG immediate release tablet Take 1 tablet (5 mg total) by mouth every 6 (six) hours as needed for moderate pain.   polyethylene glycol (MIRALAX ) 17 g packet Take 17 g by mouth daily as needed for mild constipation.   potassium chloride  SA (KLOR-CON  M) 20 MEQ tablet Take 1 tablet (20 mEq total) by mouth daily for 5 days.   Probiotic Product (PROBIOTIC DAILY PO) Take by mouth daily.   senna-docusate (SENOKOT-S) 8.6-50 MG tablet Take 1 tablet by mouth 2 (two) times daily. Stool softener   simvastatin  (ZOCOR ) 40 MG tablet Take 40 mg by mouth at bedtime.    sucralfate  (CARAFATE ) 1 g tablet Take 1 tablet (1 g total) by mouth 4 (four) times daily -  with meals and at bedtime.   vitamin B-12 (CYANOCOBALAMIN ) 500 MCG  tablet Take 1 tablet (500 mcg total) by mouth daily.   zinc  sulfate 220 (50 Zn) MG capsule Take 1 capsule (220 mg total) by mouth daily.   No facility-administered encounter medications on file as of 06/21/2021.   Thank you for the opportunity to participate in the care  of Jenna Dudley.  The palliative care team will continue to follow. Please call our office at [phone] if we can be of additional assistance.   Lamarr Welby Sharps, NP , DNP, AGPCNP-BC  COVID-19 PATIENT SCREENING TOOL Asked and negative response unless otherwise noted:  Have you had symptoms of covid, tested positive or been in contact with someone with symptoms/positive test in the past 5-10 days?

## 2021-06-21 NOTE — Progress Notes (Signed)
? ?  Subjective:  ?Patient presents today status post right third digit amputation. DOS: 06/07/2021.  Patient states that she is doing well.  She presents today with her husband.  Patient is difficult to have a coherent conversation with.  She does not appear to be in any significant distress. ? ?Past Medical History:  ?Diagnosis Date  ? Bilateral carpal tunnel syndrome 07/27/2014  ? Bipolar disorder ([place])   ? geropsych admission to [facility] in Dec 2018  ? Chronic diastolic CHF (congestive heart failure) ([place])   ? a. 03/2017 Echo: EF 55-60%, no rwma, Gr1 DD, mild MR; b. 06/2019 Echo: EF 55-60%, no rwma, mod LVH, GrII DD. Nl RV size/fxn. RVSP 54.65mmHg. Mildly dil LA.  ? Chronic low back pain 07/29/2014  ? Degenerative arthritis of lumbar spine 07/27/2014  ? Dementia ([place])   ? Depression   ? Esophageal spasm 07/29/2014  ? Gastric reflux 09/17/2014  ? GERD (gastroesophageal reflux disease)   ? History of stress test   ? a. 04/2017 MV: low risk stress test.  EF 55-65%. Probable apical ant/apical, basal, and mid inflat attenuation artifact vs ischemia/scar.  ? Hyperlipemia 07/29/2014  ? Hypertension   ? Pneumonia   ? Spinal stenosis 07/29/2014  ? ?  ? ?Objective/Physical Exam ?Neurovascular status intact.  Skin incisions appear to be well coapted with sutures intact. No sign of infectious process noted. No dehiscence. No active bleeding noted. Moderate edema noted to the surgical extremity. ? ?Radiographic Exam:  ?Absence of the third toe and third metatarsal head noted.  Clean osteotomy site ? ?Assessment: ?1. s/p right third toe amputation. DOS: 06/07/2021 ? ? ?Plan of Care:  ?1. Patient was evaluated. X-rays reviewed ?2.  Patient may begin washing and showering and getting the foot wet ?3.  Recommend antibiotic ointment over the amputation/incision area ?4.  Recommend clean cotton sock and continue minimal weightbearing in the surgical shoe ?5.  Return to clinic in 2 weeks for suture removal ? ? ?Edrick Kins,  DPM ?[place] ? ?Dr. Edrick Kins, DPM  ?  ?[address]                                    ?[place], [place] [address]                ?Office [phone]  ?Fax [phone] ? ? ? ? ? ?

## 2021-06-22 ENCOUNTER — Telehealth: Payer: Self-pay | Admitting: *Deleted

## 2021-06-22 NOTE — Telephone Encounter (Signed)
Patient does not need to see Dr. Lajoyce Corners.  She is supposed to follow-up with [facility] Dr. Wyn Quaker, NOT Lajoyce Corners. Thanks, Dr. Logan Bores

## 2021-06-22 NOTE — Progress Notes (Signed)
? Patient ID: Jenna Dudley, female    DOB: October 08, 1935, 86 y.o.   MRN: [id number] ? ?Jenna Dudley is a 86 y/o female with a history of hyperlipidemia, HTN, spinal stenosis, GERD, depression, bipolar, dementia, remote tobacco use and chronic heart failure.  ? ?Echo report from 06/03/21 reviewed and showed an EF of 55-60% along with moderate LVH, mild LAE, moderate/severe MR/ TR and severely elevated PA pressure of 85.3 mmHg. Echo from 12/11/20 - Left ventricular ejection fraction, by estimation, is 60 to 65%. The left ventricle has normal function. The left ventricle has no regional wall motion abnormalities. There is moderate to severe left ventricular hypertrophy with near cavity obliteration in systole. ? ?Admitted 06/01/21 due to chest pain.  CTA chest showed segmental PEs, right side opacity with a fluid level along with right sided pleural effusion. Given IVF and IV antibiotics. Pulmonology, vascular, podiatry and palliative care consults obtained. Also had a left third toe osteomyelitis peripheral vascular disease.  Angioplasty and stent placement was performed on 3/13.  Underwent toe amputation on 3/14. PT/OT/speech evaluations done. Initially given IV lasix with transition to oral diuretics. Given IV heparin with transition to apixaban. Discharged after 13 days.  ? ?She presents today for a follow-up visit with a chief complaint of moderate shortness of breath with minimal exertion. She describes this as chronic in nature. She has associated fatigue, chronic pain and anxiety along with this. She denies any dizziness, abdominal distention, palpitations, pedal edema, chest pain, cough, wheezing or weight gain.  ? ?Currently wearing a boot on her right foot due to recent toe amputation.  ? ?Past Medical History:  ?Diagnosis Date  ? Bilateral carpal tunnel syndrome 07/27/2014  ? Bipolar disorder (HCC)   ? geropsych admission to [facility] in Dec 2018  ? Chronic diastolic CHF (congestive heart failure) (HCC)   ? a.  03/2017 Echo: EF 55-60%, no rwma, Gr1 DD, mild MR; b. 06/2019 Echo: EF 55-60%, no rwma, mod LVH, GrII DD. Nl RV size/fxn. RVSP 54.62mmHg. Mildly dil LA.  ? Chronic low back pain 07/29/2014  ? Degenerative arthritis of lumbar spine 07/27/2014  ? Dementia (HCC)   ? Depression   ? Esophageal spasm 07/29/2014  ? Gastric reflux 09/17/2014  ? GERD (gastroesophageal reflux disease)   ? History of stress test   ? a. 04/2017 MV: low risk stress test.  EF 55-65%. Probable apical ant/apical, basal, and mid inflat attenuation artifact vs ischemia/scar.  ? Hyperlipemia 07/29/2014  ? Hypertension   ? Pneumonia   ? Spinal stenosis 07/29/2014  ? ?Past Surgical History:  ?Procedure Laterality Date  ? AMPUTATION TOE Right 06/07/2021  ? Procedure: AMPUTATION TOE;  Surgeon: Felecia Shelling, DPM;  Location: [facility] ORS;  Service: Podiatry;  Laterality: Right;  ? HIP SURGERY Right   ? INNER EAR SURGERY Right   ? LOWER EXTREMITY ANGIOGRAPHY N/A 06/06/2021  ? Procedure: Lower Extremity Angiography;  Surgeon: Annice Needy, MD;  Location: [facility] INVASIVE CV LAB;  Service: Cardiovascular;  Laterality: N/A;  ? REPLACEMENT TOTAL KNEE BILATERAL Bilateral 07/27/14  ? ?Family History  ?Problem Relation Age of Onset  ? Hypertension Mother   ? Stroke Mother   ? ?Social History  ? ?Tobacco Use  ? Smoking status: Former  ?  Types: Cigarettes  ?  Quit date: 07/29/1975  ?  Years since quitting: 45.9  ? Smokeless tobacco: Never  ?Substance Use Topics  ? Alcohol use: No  ?  Alcohol/week: 0.0 standard drinks  ? ?Allergies  ?  Allergen Reactions  ? Aspirin Other (See Comments)  ?  Unknown reaction  ? Sulfa Antibiotics Other (See Comments)  ?  Unknown reaction  ? Heparin Other (See Comments)  ?  HIT - f/u with [facility].   ? ?Prior to Admission medications   ?Medication Sig Start Date End Date Taking? Authorizing Provider  ?acetaminophen (TYLENOL) 325 MG tablet Take 2 tablets (650 mg total) by mouth every 6 (six) hours as needed for mild pain. 06/13/21  Yes Osvaldo ShipperKrishnan, Gokul, MD   ?albuterol (VENTOLIN HFA) 108 (90 Base) MCG/ACT inhaler Inhale 2 puffs into the lungs every 6 (six) hours as needed for wheezing or shortness of breath. 04/14/21  Yes Darlin DropHall, Carole N, DO  ?apixaban (ELIQUIS) 5 MG TABS tablet Take 10mg  (2 tablets) twice daily for 4 days, then start 5mg  twice daily. ?Patient taking differently: Take 5 mg by mouth 2 (two) times daily. 06/14/21  Yes Osvaldo ShipperKrishnan, Gokul, MD  ?ascorbic acid (VITAMIN C) 500 MG tablet Take 1 tablet (500 mg total) by mouth 2 (two) times daily. 06/13/21  Yes Osvaldo ShipperKrishnan, Gokul, MD  ?benzonatate (TESSALON PERLES) 100 MG capsule Take 1 capsule (100 mg total) by mouth every 6 (six) hours as needed for cough. 04/14/21 04/14/22 Yes Darlin DropHall, Carole N, DO  ?cholecalciferol (VITAMIN D) 1000 units tablet Take 1,000 Units by mouth daily.   Yes [provider]  ?clopidogrel (PLAVIX) 75 MG tablet Take 1 tablet (75 mg total) by mouth daily. 06/14/21  Yes Osvaldo ShipperKrishnan, Gokul, MD  ?doxycycline (VIBRA-TABS) 100 MG tablet Take 1 tablet (100 mg total) by mouth every 12 (twelve) hours for 11 days. 06/13/21 06/24/21 Yes Osvaldo ShipperKrishnan, Gokul, MD  ?Lactobacillus Acid-Pectin (ACIDOPHILUS/PECTIN) CAPS Take by mouth.   Yes [provider]  ?magnesium oxide (MAG-OX) 400 (240 Mg) MG tablet Take 1 tablet (400 mg total) by mouth 2 (two) times daily. 06/14/21  Yes Osvaldo ShipperKrishnan, Gokul, MD  ?megestrol (MEGACE) 400 MG/10ML suspension Take 10 mLs (400 mg total) by mouth daily. 06/14/21  Yes Osvaldo ShipperKrishnan, Gokul, MD  ?metoprolol succinate (TOPROL-XL) 25 MG 24 hr tablet Take 1 tablet (25 mg total) by mouth daily. 06/13/21  Yes Osvaldo ShipperKrishnan, Gokul, MD  ?mirtazapine (REMERON) 7.5 MG tablet Take 1 tablet (7.5 mg total) by mouth at bedtime. 06/13/21  Yes Osvaldo ShipperKrishnan, Gokul, MD  ?Multiple Vitamin (MULTIVITAMIN WITH MINERALS) TABS tablet Take 1 tablet by mouth daily. 06/14/21  Yes Osvaldo ShipperKrishnan, Gokul, MD  ?Nutritional Supplements (,FEEDING SUPPLEMENT, PROSOURCE PLUS) liquid Take 30 mLs by mouth 3 (three) times daily between meals.  06/13/21  Yes Osvaldo ShipperKrishnan, Gokul, MD  ?omeprazole (PRILOSEC) 20 MG capsule Take 20 mg by mouth 2 (two) times daily before a meal.    Yes [provider]  ?oxyCODONE (OXY IR/ROXICODONE) 5 MG immediate release tablet Take 1 tablet (5 mg total) by mouth every 6 (six) hours as needed for moderate pain. 06/13/21  Yes Osvaldo ShipperKrishnan, Gokul, MD  ?polyethylene glycol (MIRALAX) 17 g packet Take 17 g by mouth daily as needed for mild constipation. 04/14/21  Yes Darlin DropHall, Carole N, DO  ?Probiotic Product (PROBIOTIC DAILY PO) Take by mouth daily.   Yes [provider]  ?senna-docusate (SENOKOT-S) 8.6-50 MG tablet Take 1 tablet by mouth 2 (two) times daily. Stool softener 12/17/20 12/17/21 Yes Pokhrel, Laxman, MD  ?simvastatin (ZOCOR) 40 MG tablet Take 40 mg by mouth at bedtime.    Yes [provider]  ?sucralfate (CARAFATE) 1 g tablet Take 1 tablet (1 g total) by mouth 4 (four) times daily -  with meals  and at bedtime. 06/13/21  Yes Osvaldo Shipper, MD  ?vitamin B-12 (CYANOCOBALAMIN) 500 MCG tablet Take 1 tablet (500 mcg total) by mouth daily. 12/17/20 12/17/21 Yes Pokhrel, Laxman, MD  ?zinc sulfate 220 (50 Zn) MG capsule Take 1 capsule (220 mg total) by mouth daily. 06/14/21  Yes Osvaldo Shipper, MD  ? ? ?Review of Systems  ?Constitutional:  Positive for fatigue (tires easily). Negative for appetite change (improving) and fever.  ?HENT:  Negative for congestion, postnasal drip and sore throat.   ?Eyes: Negative.   ?Respiratory:  Positive for shortness of breath (easily). Negative for cough, chest tightness and wheezing.   ?Cardiovascular:  Negative for chest pain, palpitations and leg swelling.  ?Gastrointestinal:  Negative for abdominal distention and abdominal pain.  ?Endocrine: Negative.   ?Genitourinary: Negative.  Negative for difficulty urinating, dysuria and frequency.  ?Musculoskeletal:  Positive for back pain. Negative for neck pain.  ?Skin: Negative.   ?Allergic/Immunologic: Negative.   ?Neurological:  Negative  for dizziness, syncope and light-headedness.  ?Hematological:  Negative for adenopathy. Bruises/bleeds easily.  ?Psychiatric/Behavioral:  Negative for dysphoric mood and sleep disturbance (sleeping on 2 pillows

## 2021-06-22 NOTE — Telephone Encounter (Signed)
"  Can you fax [place] her clinical note?  She was there yesterday.  She's here for rehab.  Also, on her discharge summary it says he's sending her for a vascular with Dr. Lajoyce Corners.  Dr. Lajoyce Corners is an Orthopedic doctor in [place].  Can he verify this is who she's supposed to see?"  I will fax you the clinical note.  I will send a message to Dr. Logan Bores and his assistant regarding the Vascular question. ? ?I faxed the clinical note to Kim at [employer]. ?

## 2021-06-23 ENCOUNTER — Encounter: Payer: Self-pay | Admitting: Family

## 2021-06-23 ENCOUNTER — Ambulatory Visit: Payer: Medicare Other | Attending: Family | Admitting: Family

## 2021-06-23 VITALS — BP 138/70 | HR 88 | Resp 16 | Ht 60.0 in

## 2021-06-23 DIAGNOSIS — E785 Hyperlipidemia, unspecified: Secondary | ICD-10-CM | POA: Diagnosis present

## 2021-06-23 DIAGNOSIS — M4800 Spinal stenosis, site unspecified: Secondary | ICD-10-CM | POA: Insufficient documentation

## 2021-06-23 DIAGNOSIS — F17210 Nicotine dependence, cigarettes, uncomplicated: Secondary | ICD-10-CM | POA: Diagnosis not present

## 2021-06-23 DIAGNOSIS — I110 Hypertensive heart disease with heart failure: Secondary | ICD-10-CM | POA: Insufficient documentation

## 2021-06-23 DIAGNOSIS — F0393 Unspecified dementia, unspecified severity, with mood disturbance: Secondary | ICD-10-CM | POA: Diagnosis not present

## 2021-06-23 DIAGNOSIS — Z8249 Family history of ischemic heart disease and other diseases of the circulatory system: Secondary | ICD-10-CM | POA: Diagnosis not present

## 2021-06-23 DIAGNOSIS — K219 Gastro-esophageal reflux disease without esophagitis: Secondary | ICD-10-CM | POA: Diagnosis not present

## 2021-06-23 DIAGNOSIS — F319 Bipolar disorder, unspecified: Secondary | ICD-10-CM | POA: Insufficient documentation

## 2021-06-23 DIAGNOSIS — I5032 Chronic diastolic (congestive) heart failure: Secondary | ICD-10-CM | POA: Diagnosis not present

## 2021-06-23 DIAGNOSIS — I10 Essential (primary) hypertension: Secondary | ICD-10-CM

## 2021-06-23 NOTE — Telephone Encounter (Signed)
I left her a message that Dr. Logan Bores wants her to see Dr. Wyn Quaker at [facility].  I asked her to give me a call if she has any further questions. ?

## 2021-06-23 NOTE — Patient Instructions (Addendum)
Continue weighing daily and call for an overnight weight gain of 3 pounds or more or a weekly weight gain of more than 5 pounds.  ° ° ° °Call us in the future if you need us for anything °

## 2021-07-05 ENCOUNTER — Ambulatory Visit (INDEPENDENT_AMBULATORY_CARE_PROVIDER_SITE_OTHER): Payer: Medicare Other | Admitting: Podiatry

## 2021-07-05 DIAGNOSIS — Z89421 Acquired absence of other right toe(s): Secondary | ICD-10-CM

## 2021-07-05 NOTE — Progress Notes (Signed)
? ?  Subjective:  ?Patient presents today status post right third digit amputation. DOS: 06/07/2021.  Patient states that she is doing well.  She presents today with her husband.  Patient is difficult to have a coherent conversation with.  She does not appear to be in any significant distress. ? ?Past Medical History:  ?Diagnosis Date  ? Bilateral carpal tunnel syndrome 07/27/2014  ? Bipolar disorder (HCC)   ? geropsych admission to [facility] in Dec 2018  ? Chronic diastolic CHF (congestive heart failure) (HCC)   ? a. 03/2017 Echo: EF 55-60%, no rwma, Gr1 DD, mild MR; b. 06/2019 Echo: EF 55-60%, no rwma, mod LVH, GrII DD. Nl RV size/fxn. RVSP 54.29mmHg. Mildly dil LA.  ? Chronic low back pain 07/29/2014  ? Degenerative arthritis of lumbar spine 07/27/2014  ? Dementia (HCC)   ? Depression   ? Esophageal spasm 07/29/2014  ? Gastric reflux 09/17/2014  ? GERD (gastroesophageal reflux disease)   ? History of stress test   ? a. 04/2017 MV: low risk stress test.  EF 55-65%. Probable apical ant/apical, basal, and mid inflat attenuation artifact vs ischemia/scar.  ? Hyperlipemia 07/29/2014  ? Hypertension   ? Pneumonia   ? Spinal stenosis 07/29/2014  ? ?  ? ?Objective/Physical Exam ?Neurovascular status intact.  Skin is completely epithelialized.  No dehiscence noted.  No signs of infection noted. ? ?Radiographic Exam:  ?Absence of the third toe and third metatarsal head noted.  Clean osteotomy site ? ?Assessment: ?1. s/p right third toe amputation. DOS: 06/07/2021 ? ? ?Plan of Care:  ?-All questions and concerns were discussed with the patient in extensive detail ?-At this time the wound has completely healed and clinically reepithelialized.  I discussed with her that she can return to regular shoes no restrictions.  If any foot and ankle issues arise in the future to come see Dr. Logan Bores or myself.  She states understanding. ? ? ? ? ? ? ? ? ? ?

## 2021-07-11 ENCOUNTER — Inpatient Hospital Stay
Admission: EM | Admit: 2021-07-11 | Discharge: 2021-07-15 | DRG: 291 | Disposition: A | Discharge status: Home Health | Payer: Medicare Other | Attending: Internal Medicine | Admitting: Internal Medicine

## 2021-07-11 ENCOUNTER — Encounter: Payer: Self-pay | Admitting: Radiology

## 2021-07-11 ENCOUNTER — Other Ambulatory Visit: Payer: Self-pay

## 2021-07-11 ENCOUNTER — Non-Acute Institutional Stay: Payer: Medicare Other | Admitting: Primary Care

## 2021-07-11 ENCOUNTER — Emergency Department: Payer: Medicare Other

## 2021-07-11 DIAGNOSIS — E43 Unspecified severe protein-calorie malnutrition: Secondary | ICD-10-CM

## 2021-07-11 DIAGNOSIS — Z9889 Other specified postprocedural states: Secondary | ICD-10-CM

## 2021-07-11 DIAGNOSIS — Z8249 Family history of ischemic heart disease and other diseases of the circulatory system: Secondary | ICD-10-CM

## 2021-07-11 DIAGNOSIS — R5381 Other malaise: Secondary | ICD-10-CM

## 2021-07-11 DIAGNOSIS — F319 Bipolar disorder, unspecified: Secondary | ICD-10-CM | POA: Diagnosis present

## 2021-07-11 DIAGNOSIS — J9601 Acute respiratory failure with hypoxia: Secondary | ICD-10-CM | POA: Diagnosis not present

## 2021-07-11 DIAGNOSIS — Z96653 Presence of artificial knee joint, bilateral: Secondary | ICD-10-CM | POA: Diagnosis present

## 2021-07-11 DIAGNOSIS — R1084 Generalized abdominal pain: Secondary | ICD-10-CM

## 2021-07-11 DIAGNOSIS — K3189 Other diseases of stomach and duodenum: Secondary | ICD-10-CM | POA: Diagnosis present

## 2021-07-11 DIAGNOSIS — Z20822 Contact with and (suspected) exposure to covid-19: Secondary | ICD-10-CM | POA: Diagnosis present

## 2021-07-11 DIAGNOSIS — D638 Anemia in other chronic diseases classified elsewhere: Secondary | ICD-10-CM | POA: Diagnosis present

## 2021-07-11 DIAGNOSIS — R54 Age-related physical debility: Secondary | ICD-10-CM | POA: Diagnosis present

## 2021-07-11 DIAGNOSIS — F0393 Unspecified dementia, unspecified severity, with mood disturbance: Secondary | ICD-10-CM | POA: Diagnosis present

## 2021-07-11 DIAGNOSIS — Z7901 Long term (current) use of anticoagulants: Secondary | ICD-10-CM | POA: Diagnosis not present

## 2021-07-11 DIAGNOSIS — F03918 Unspecified dementia, unspecified severity, with other behavioral disturbance: Secondary | ICD-10-CM | POA: Diagnosis present

## 2021-07-11 DIAGNOSIS — I2609 Other pulmonary embolism with acute cor pulmonale: Secondary | ICD-10-CM | POA: Diagnosis not present

## 2021-07-11 DIAGNOSIS — J9621 Acute and chronic respiratory failure with hypoxia: Secondary | ICD-10-CM | POA: Diagnosis present

## 2021-07-11 DIAGNOSIS — E8809 Other disorders of plasma-protein metabolism, not elsewhere classified: Secondary | ICD-10-CM | POA: Diagnosis present

## 2021-07-11 DIAGNOSIS — Z9049 Acquired absence of other specified parts of digestive tract: Secondary | ICD-10-CM

## 2021-07-11 DIAGNOSIS — I272 Pulmonary hypertension, unspecified: Secondary | ICD-10-CM | POA: Diagnosis present

## 2021-07-11 DIAGNOSIS — J918 Pleural effusion in other conditions classified elsewhere: Secondary | ICD-10-CM | POA: Diagnosis present

## 2021-07-11 DIAGNOSIS — Z681 Body mass index (BMI) 19 or less, adult: Secondary | ICD-10-CM | POA: Diagnosis not present

## 2021-07-11 DIAGNOSIS — Z87891 Personal history of nicotine dependence: Secondary | ICD-10-CM

## 2021-07-11 DIAGNOSIS — D509 Iron deficiency anemia, unspecified: Secondary | ICD-10-CM | POA: Diagnosis present

## 2021-07-11 DIAGNOSIS — R109 Unspecified abdominal pain: Secondary | ICD-10-CM | POA: Diagnosis present

## 2021-07-11 DIAGNOSIS — R410 Disorientation, unspecified: Secondary | ICD-10-CM | POA: Diagnosis present

## 2021-07-11 DIAGNOSIS — F32A Depression, unspecified: Secondary | ICD-10-CM | POA: Diagnosis present

## 2021-07-11 DIAGNOSIS — Z90710 Acquired absence of both cervix and uterus: Secondary | ICD-10-CM

## 2021-07-11 DIAGNOSIS — E785 Hyperlipidemia, unspecified: Secondary | ICD-10-CM | POA: Diagnosis present

## 2021-07-11 DIAGNOSIS — R188 Other ascites: Secondary | ICD-10-CM | POA: Diagnosis present

## 2021-07-11 DIAGNOSIS — I2699 Other pulmonary embolism without acute cor pulmonale: Secondary | ICD-10-CM | POA: Diagnosis present

## 2021-07-11 DIAGNOSIS — Z7189 Other specified counseling: Secondary | ICD-10-CM | POA: Diagnosis not present

## 2021-07-11 DIAGNOSIS — K219 Gastro-esophageal reflux disease without esophagitis: Secondary | ICD-10-CM | POA: Diagnosis present

## 2021-07-11 DIAGNOSIS — I248 Other forms of acute ischemic heart disease: Secondary | ICD-10-CM | POA: Diagnosis present

## 2021-07-11 DIAGNOSIS — Z8673 Personal history of transient ischemic attack (TIA), and cerebral infarction without residual deficits: Secondary | ICD-10-CM

## 2021-07-11 DIAGNOSIS — Z86711 Personal history of pulmonary embolism: Secondary | ICD-10-CM

## 2021-07-11 DIAGNOSIS — Z823 Family history of stroke: Secondary | ICD-10-CM

## 2021-07-11 DIAGNOSIS — Z79899 Other long term (current) drug therapy: Secondary | ICD-10-CM

## 2021-07-11 DIAGNOSIS — D696 Thrombocytopenia, unspecified: Secondary | ICD-10-CM | POA: Diagnosis present

## 2021-07-11 DIAGNOSIS — Z7902 Long term (current) use of antithrombotics/antiplatelets: Secondary | ICD-10-CM

## 2021-07-11 DIAGNOSIS — R601 Generalized edema: Secondary | ICD-10-CM

## 2021-07-11 DIAGNOSIS — D6959 Other secondary thrombocytopenia: Secondary | ICD-10-CM | POA: Diagnosis present

## 2021-07-11 DIAGNOSIS — Z89421 Acquired absence of other right toe(s): Secondary | ICD-10-CM | POA: Diagnosis not present

## 2021-07-11 DIAGNOSIS — I110 Hypertensive heart disease with heart failure: Principal | ICD-10-CM | POA: Diagnosis present

## 2021-07-11 DIAGNOSIS — I5033 Acute on chronic diastolic (congestive) heart failure: Secondary | ICD-10-CM | POA: Diagnosis present

## 2021-07-11 DIAGNOSIS — R627 Adult failure to thrive: Secondary | ICD-10-CM | POA: Diagnosis present

## 2021-07-11 DIAGNOSIS — I509 Heart failure, unspecified: Secondary | ICD-10-CM

## 2021-07-11 DIAGNOSIS — I739 Peripheral vascular disease, unspecified: Secondary | ICD-10-CM | POA: Diagnosis present

## 2021-07-11 DIAGNOSIS — Z515 Encounter for palliative care: Secondary | ICD-10-CM | POA: Diagnosis not present

## 2021-07-11 DIAGNOSIS — J90 Pleural effusion, not elsewhere classified: Secondary | ICD-10-CM | POA: Diagnosis not present

## 2021-07-11 DIAGNOSIS — D7589 Other specified diseases of blood and blood-forming organs: Secondary | ICD-10-CM | POA: Diagnosis present

## 2021-07-11 LAB — CBC
HCT: 25.7 % — ABNORMAL LOW (ref 36.0–46.0)
Hemoglobin: 7.9 g/dL — ABNORMAL LOW (ref 12.0–15.0)
MCH: 36.2 pg — ABNORMAL HIGH (ref 26.0–34.0)
MCHC: 30.7 g/dL (ref 30.0–36.0)
MCV: 117.9 fL — ABNORMAL HIGH (ref 80.0–100.0)
Platelets: 133 10*3/uL — ABNORMAL LOW (ref 150–400)
RBC: 2.18 MIL/uL — ABNORMAL LOW (ref 3.87–5.11)
RDW: 22.3 % — ABNORMAL HIGH (ref 11.5–15.5)
WBC: 4.3 10*3/uL (ref 4.0–10.5)
nRBC: 0 % (ref 0.0–0.2)

## 2021-07-11 LAB — COMPREHENSIVE METABOLIC PANEL
ALT: 13 U/L (ref 0–44)
AST: 22 U/L (ref 15–41)
Albumin: 3.3 g/dL — ABNORMAL LOW (ref 3.5–5.0)
Alkaline Phosphatase: 45 U/L (ref 38–126)
Anion gap: 8 (ref 5–15)
BUN: 20 mg/dL (ref 8–23)
CO2: 33 mmol/L — ABNORMAL HIGH (ref 22–32)
Calcium: 8.4 mg/dL — ABNORMAL LOW (ref 8.9–10.3)
Chloride: 95 mmol/L — ABNORMAL LOW (ref 98–111)
Creatinine, Ser: 0.66 mg/dL (ref 0.44–1.00)
GFR, Estimated: >60 mL/min (ref >60)
Glucose, Bld: 117 mg/dL — ABNORMAL HIGH (ref 70–99)
Potassium: 4.1 mmol/L (ref 3.5–5.1)
Sodium: 136 mmol/L (ref 135–145)
Total Bilirubin: 0.4 mg/dL (ref 0.3–1.2)
Total Protein: 6.2 g/dL — ABNORMAL LOW (ref 6.5–8.1)

## 2021-07-11 LAB — TROPONIN I (HIGH SENSITIVITY)
Troponin I (High Sensitivity): 89 ng/L — ABNORMAL HIGH (ref <18)
Troponin I (High Sensitivity): 81 ng/L — ABNORMAL HIGH (ref <18)

## 2021-07-11 LAB — BLOOD GAS, VENOUS
Acid-Base Excess: 9.7 mmol/L — ABNORMAL HIGH (ref 0.0–2.0)
Bicarbonate: 37.6 mmol/L — ABNORMAL HIGH (ref 20.0–28.0)
O2 Saturation: 90.1 %
Patient temperature: 37
pCO2, Ven: 65 mmHg — ABNORMAL HIGH (ref 44–60)
pH, Ven: 7.37 (ref 7.25–7.43)
pO2, Ven: 60 mmHg — ABNORMAL HIGH (ref 32–45)

## 2021-07-11 LAB — RESP PANEL BY RT-PCR (FLU A&B, COVID) ARPGX2
Influenza A by PCR: NEGATIVE
Influenza B by PCR: NEGATIVE
SARS Coronavirus 2 by RT PCR: NEGATIVE

## 2021-07-11 LAB — BRAIN NATRIURETIC PEPTIDE
B Natriuretic Peptide: 1471.7 pg/mL — ABNORMAL HIGH (ref 0.0–100.0)
B Natriuretic Peptide: 1334.9 pg/mL — ABNORMAL HIGH (ref 0.0–100.0)

## 2021-07-11 LAB — LACTIC ACID, PLASMA: Lactic Acid, Venous: 1.7 mmol/L (ref 0.5–1.9)

## 2021-07-11 LAB — LIPASE, BLOOD: Lipase: 33 U/L (ref 11–51)

## 2021-07-11 LAB — PROCALCITONIN: Procalcitonin: <0.1 ng/mL

## 2021-07-11 MED ORDER — IOHEXOL 300 MG/ML  SOLN
100.0000 mL | Freq: Once | INTRAMUSCULAR | Status: AC | PRN
  Administered 2021-07-11: 100 mL via INTRAVENOUS

## 2021-07-11 MED ORDER — FUROSEMIDE 10 MG/ML IJ SOLN
40.0000 mg | Freq: Every day | INTRAMUSCULAR | Status: DC
  Administered 2021-07-12 – 2021-07-14 (×3): 40 mg via INTRAVENOUS
  Filled 2021-07-11 (×3): qty 4

## 2021-07-11 MED ORDER — PANTOPRAZOLE SODIUM 40 MG PO TBEC
40.0000 mg | DELAYED_RELEASE_TABLET | Freq: Every day | ORAL | Status: DC
  Administered 2021-07-12 – 2021-07-15 (×4): 40 mg via ORAL
  Filled 2021-07-11 (×4): qty 1

## 2021-07-11 MED ORDER — SODIUM CHLORIDE 0.9% FLUSH: 3.0000 mL | INTRAVENOUS | Status: DC | PRN

## 2021-07-11 MED ORDER — FUROSEMIDE 10 MG/ML IJ SOLN
40.0000 mg | Freq: Once | INTRAMUSCULAR | Status: AC
  Administered 2021-07-11: 40 mg via INTRAVENOUS
  Filled 2021-07-11: qty 4

## 2021-07-11 MED ORDER — SODIUM CHLORIDE 0.9 % IV BOLUS
250.0000 mL | Freq: Once | INTRAVENOUS | Status: AC
  Administered 2021-07-11: 250 mL via INTRAVENOUS

## 2021-07-11 MED ORDER — ONDANSETRON HCL 4 MG/2ML IJ SOLN: 4.0000 mg | Freq: Four times a day (QID) | INTRAMUSCULAR | Status: DC | PRN

## 2021-07-11 MED ORDER — ACETAMINOPHEN 325 MG PO TABS: 650.0000 mg | ORAL_TABLET | Freq: Four times a day (QID) | ORAL | Status: DC | PRN

## 2021-07-11 MED ORDER — CLOPIDOGREL BISULFATE 75 MG PO TABS
75.0000 mg | ORAL_TABLET | Freq: Every day | ORAL | Status: DC
  Administered 2021-07-12 – 2021-07-13 (×2): 75 mg via ORAL
  Filled 2021-07-11 (×2): qty 1

## 2021-07-11 MED ORDER — RISAQUAD PO CAPS
1.0000 | ORAL_CAPSULE | Freq: Every day | ORAL | Status: DC
  Administered 2021-07-12 – 2021-07-15 (×4): 1 via ORAL
  Filled 2021-07-11 (×4): qty 1

## 2021-07-11 MED ORDER — APIXABAN 5 MG PO TABS
5.0000 mg | ORAL_TABLET | Freq: Two times a day (BID) | ORAL | Status: DC
  Administered 2021-07-11 – 2021-07-15 (×8): 5 mg via ORAL
  Filled 2021-07-11 (×8): qty 1

## 2021-07-11 MED ORDER — SODIUM CHLORIDE 0.9 % IV BOLUS: 500.0000 mL | Freq: Once | INTRAVENOUS | Status: DC

## 2021-07-11 MED ORDER — SIMVASTATIN 20 MG PO TABS
40.0000 mg | ORAL_TABLET | Freq: Every day | ORAL | Status: DC
Start: 2021-07-11 — End: 2021-07-15
  Administered 2021-07-11 – 2021-07-14 (×4): 40 mg via ORAL
  Filled 2021-07-11 (×4): qty 2

## 2021-07-11 MED ORDER — ACETAMINOPHEN 325 MG PO TABS
650.0000 mg | ORAL_TABLET | ORAL | Status: DC | PRN
  Administered 2021-07-12 – 2021-07-15 (×4): 650 mg via ORAL
  Filled 2021-07-11 (×4): qty 2

## 2021-07-11 MED ORDER — SODIUM CHLORIDE 0.9% FLUSH
3.0000 mL | Freq: Two times a day (BID) | INTRAVENOUS | Status: DC
  Administered 2021-07-11 – 2021-07-14 (×7): 3 mL via INTRAVENOUS

## 2021-07-11 MED ORDER — SENNOSIDES-DOCUSATE SODIUM 8.6-50 MG PO TABS
1.0000 | ORAL_TABLET | Freq: Two times a day (BID) | ORAL | Status: DC
  Administered 2021-07-11 – 2021-07-15 (×8): 1 via ORAL
  Filled 2021-07-11 (×8): qty 1

## 2021-07-11 MED ORDER — SODIUM CHLORIDE 0.9 % IV SOLN
2.0000 g | INTRAVENOUS | Status: DC
  Administered 2021-07-11 – 2021-07-12 (×2): 2 g via INTRAVENOUS
  Filled 2021-07-11 (×3): qty 20

## 2021-07-11 MED ORDER — BENZONATATE 100 MG PO CAPS: 100.0000 mg | ORAL_CAPSULE | Freq: Four times a day (QID) | ORAL | Status: DC | PRN

## 2021-07-11 MED ORDER — MORPHINE SULFATE (PF) 2 MG/ML IV SOLN
2.0000 mg | INTRAVENOUS | Status: DC | PRN
  Administered 2021-07-11 – 2021-07-14 (×6): 2 mg via INTRAVENOUS
  Filled 2021-07-11 (×7): qty 1

## 2021-07-11 MED ORDER — SODIUM CHLORIDE 0.9 % IV SOLN: 250.0000 mL | INTRAVENOUS | Status: DC | PRN

## 2021-07-11 MED ORDER — ALBUTEROL SULFATE (2.5 MG/3ML) 0.083% IN NEBU
3.0000 mL | INHALATION_SOLUTION | Freq: Four times a day (QID) | RESPIRATORY_TRACT | Status: DC | PRN
  Administered 2021-07-12: 3 mL via RESPIRATORY_TRACT
  Filled 2021-07-11: qty 3

## 2021-07-11 MED ORDER — SODIUM CHLORIDE 0.9 % IV SOLN
500.0000 mg | INTRAVENOUS | Status: DC
  Administered 2021-07-11 – 2021-07-12 (×2): 500 mg via INTRAVENOUS
  Filled 2021-07-11 (×3): qty 5

## 2021-07-11 MED ORDER — MIRTAZAPINE 15 MG PO TABS
7.5000 mg | ORAL_TABLET | Freq: Every day | ORAL | Status: DC
Start: 2021-07-11 — End: 2021-07-15
  Administered 2021-07-11 – 2021-07-14 (×4): 7.5 mg via ORAL
  Filled 2021-07-11 (×4): qty 1

## 2021-07-11 MED ORDER — VITAMIN B-12 1000 MCG PO TABS
500.0000 ug | ORAL_TABLET | Freq: Every day | ORAL | Status: DC
  Administered 2021-07-12 – 2021-07-15 (×4): 500 ug via ORAL
  Filled 2021-07-11 (×4): qty 1

## 2021-07-11 NOTE — Assessment & Plan Note (Signed)
Continue Remeron 

## 2021-07-11 NOTE — Assessment & Plan Note (Signed)
She does have chronic thrombocytopenia. ?Her platelet count is at baseline. ?

## 2021-07-11 NOTE — Assessment & Plan Note (Addendum)
Patient shows symptoms consistent with CHF exacerbation.   ?Elevated BNP, hypoxia, Pedal edema, bibasilar crackles. ?Echocardiogram last month showed LVEF 55 to 60%. ?-Continue Lasix 40 mg IV daily. ?-Daily weight and BMP ?-Strict intake and output ?

## 2021-07-11 NOTE — Assessment & Plan Note (Addendum)
There is no obvious bleeding noted.  Anemia panel with anemia of chronic disease, folate normal, B12 pending.  Significant macrocytosis. ?-Monitor H&H.  ?- Transfusion threshold 7.0 ?

## 2021-07-11 NOTE — ED Notes (Signed)
Patient transported to CT 

## 2021-07-11 NOTE — ED Provider Notes (Signed)
? ?[facility] ?Provider Note ? ? ? Event Date/Time  ? First MD Initiated Contact with Patient 07/11/21 1524   ?  (approximate) ? ? ?History  ? ?Abdominal Pain ? ? ?HPI ? ?Jenna Dudley is a 86 y.o. female   history of reflux chronic pain spinal stenosis CHF dementia bipolar disorder presents to the ER for evaluation of abdominal pain.  Patient states that she feels very anxious.  Denies any chest pain or shortness of breath.  No reported fevers.  Patient I am not able to provide much additional history.  States that she is scared. ? ?  ? ? ?Physical Exam  ? ?Triage Vital Signs: ?ED Triage Vitals  ?Enc Vitals Group  ?   BP 07/11/21 1359 111/67  ?   Pulse Rate 07/11/21 1359 86  ?   Resp 07/11/21 1359 16  ?   Temp 07/11/21 1359 97.9 ?F (36.6 ?C)  ?   Temp src --   ?   SpO2 07/11/21 1359 100 %  ?   Weight 07/11/21 1400 118 lb 13.3 oz (53.9 kg)  ?   Height 07/11/21 1400 5' (1.524 m)  ?   Head Circumference --   ?   Peak Flow --   ?   Pain Score 07/11/21 1400 8  ?   Pain Loc --   ?   Pain Edu? --   ?   Excl. in [place]? --   ? ? ?Most recent vital signs: ?Vitals:  ? 07/11/21 1359  ?BP: 111/67  ?Pulse: 86  ?Resp: 16  ?Temp: 97.9 ?F (36.6 ?C)  ?SpO2: 100%  ? ? ? ?Constitutional: Alert  ?Eyes: Conjunctivae are normal.  ?Head: Atraumatic. ?Nose: No congestion/rhinnorhea. ?Mouth/Throat: Mucous membranes are moist.   ?Neck: Painless ROM.  ?Cardiovascular:   Good peripheral circulation. No m/g/r ?Respiratory: mild tachypnea, diminished posterior bs ?Gastrointestinal: Soft but with generalized ttp.  No guarding or rebound ?Musculoskeletal:  no deformity ?Neurologic:  MAE spontaneously. No gross focal neurologic deficits are appreciated.  ?Skin:  Skin is warm, dry and intact. No rash noted. ?Psychiatric: Mood and affect are normal. Speech and behavior are normal. ? ? ? ?ED Results / Procedures / Treatments  ? ?Labs ?(all labs ordered are listed, but only abnormal results are displayed) ?Labs Reviewed   ?COMPREHENSIVE METABOLIC PANEL - Abnormal; Notable for the following components:  ?    Result Value  ? Chloride 95 (*)   ? CO2 33 (*)   ? Glucose, Bld 117 (*)   ? Calcium 8.4 (*)   ? Total Protein 6.2 (*)   ? Albumin 3.3 (*)   ? All other components within normal limits  ?CBC - Abnormal; Notable for the following components:  ? RBC 2.18 (*)   ? Hemoglobin 7.9 (*)   ? HCT 25.7 (*)   ? MCV 117.9 (*)   ? MCH 36.2 (*)   ? RDW 22.3 (*)   ? Platelets 133 (*)   ? All other components within normal limits  ?TROPONIN I (HIGH SENSITIVITY) - Abnormal; Notable for the following components:  ? Troponin I (High Sensitivity) 89 (*)   ? All other components within normal limits  ?CULTURE, BLOOD (ROUTINE X 2)  ?CULTURE, BLOOD (ROUTINE X 2)  ?RESP PANEL BY RT-PCR (FLU A&B, COVID) ARPGX2  ?LIPASE, BLOOD  ?URINALYSIS, ROUTINE W REFLEX MICROSCOPIC  ?BLOOD GAS, VENOUS  ?BRAIN NATRIURETIC PEPTIDE  ?TROPONIN I (HIGH SENSITIVITY)  ? ? ? ?EKG ?ED ECG  REPORT ?I, Merlyn Lot, the attending physician, personally viewed and interpreted this ECG. ? ? Date: 07/11/2021 ? EKG Time: 14:10 ? Rate: 80 ? Rhythm: afib ? Axis: left ? Intervals:  normal ? ST&T Change: poor r wave progression ? ? ? ? ?RADIOLOGY ?Please see ED Course for my review and interpretation. ? ?I personally reviewed all radiographic images ordered to evaluate for the above acute complaints and reviewed radiology reports and findings.  These findings were personally discussed with the patient.  Please see medical record for radiology report. ? ? ? ?PROCEDURES: ? ?Critical Care performed:  ? ?Procedures ? ? ?MEDICATIONS ORDERED IN ED: ?Medications  ?morphine (PF) 2 MG/ML injection 2 mg (has no administration in time range)  ?furosemide (LASIX) injection 40 mg (has no administration in time range)  ?cefTRIAXone (ROCEPHIN) 2 g in sodium chloride 0.9 % 100 mL IVPB (has no administration in time range)  ?azithromycin (ZITHROMAX) 500 mg in sodium chloride 0.9 % 250 mL IVPB (has no  administration in time range)  ?sodium chloride 0.9 % bolus 250 mL (250 mLs Intravenous New Bag/Given 07/11/21 1605)  ?iohexol (OMNIPAQUE) 300 MG/ML solution 100 mL (100 mLs Intravenous Contrast Given 07/11/21 1620)  ? ? ? ?IMPRESSION / MDM / ASSESSMENT AND PLAN / ED COURSE  ?I reviewed the triage vital signs and the nursing notes. ?             ?               ? ?Differential diagnosis includes, but is not limited to, enteritis, SBO, mass, colitis, diverticulitis, bursitis, biliary pathology, ACS, CHF, GERD ? ?Patient presenting to the ER with symptoms as described above.  Patient poor historian, from [employer].  Does appear short of breath complaining of abdominal pain will order chest x-ray as well as CT imaging. ? ? ?Clinical Course as of 07/11/21 1726  ?Mon Jul 11, 2021  ?1634 CT imaging and chest x-ray on my review and interpretation shows evidence of large bilateral effusions.  No obstruction.  Will await formal radiology report. [PR]  ?1646 Findings concerning for volume overload CHF as well as suggestion of consolidation on possible pneumonia.  Given her shortness of breath we will order blood cultures and cover with antibiotics.  She is not meeting septic criteria at this time will give IV Lasix.  Given her presentation do feel she will require hospitalization. [PR]  ?1725 Case discussed in consultation with hospitalist who agrees admit patient to their service. [PR]  ?  ?Clinical Course User Index ?[PR] Merlyn Lot, MD  ? ? ? ?FINAL CLINICAL IMPRESSION(S) / ED DIAGNOSES  ? ?Final diagnoses:  ?Generalized abdominal pain  ?Anasarca  ? ? ? ?Rx / DC Orders  ? ?ED Discharge Orders   ? ? None  ? ?  ? ? ? ?Note:  This document was prepared using Dragon voice recognition software and may include unintentional dictation errors. ? ?  ?Merlyn Lot, MD ?07/11/21 1726 ? ?

## 2021-07-11 NOTE — Assessment & Plan Note (Addendum)
-   Continue Eliquis 

## 2021-07-11 NOTE — ED Triage Notes (Signed)
Pt here with abd pain but states she is scared and her abd hurts. Pt has hx of dementia, so assessment is limited. ?

## 2021-07-11 NOTE — ED Notes (Signed)
Pt mild distress in bed, a/ox3, dementia baseline. Pt states she has ABD pain, but otherwise cannot express more specificity on this. Pt winces with any touch to entire ABD and flanks. Denies GU symptoms, n/v/d. Endorses some kind of BM today.VSS ?

## 2021-07-11 NOTE — H&P (Addendum)
?History and Physical  ? ? ?Patient: Jenna Dudley JJK:[id number] DOB: [birth year] ? ?DOA: 07/11/2021 ? ?DOS: the patient was seen and examined on 07/11/2021 ? ?PCP: Aderoju, Marin Olp, MD  ? ?Patient coming from: SNF ? ?Chief Complaint:  ?Chief Complaint  ?Patient presents with  ? Abdominal Pain  ? ?HPI: Jenna Dudley is a 86 y.o. female with PMH significant of chronic diastolic CHF with recent echo shows LVEF 55 to 60%, dementia, depression, GERD, hyperlipidemia, hypertension, bipolar disorder, history of PE on Eliquis was sent from SNF with concern about UTI.  History is obtained from ED records, Patient has developed lower abdominal pain associated with shortness of breath, SNF staff thought patient might have UTI, denies any fever, nausea, vomiting, cough, diarrhea.  They denied any fall, trauma or injury to the head. ?Patient describes abdominal pain is severe, localized in the lower abdomen.  Denies any UTI symptoms. ? ?ED course: Patient was hypertensive other vitals were stable. ?Labs include sodium 136, potassium 4.1, chloride 95, bicarb 33, glucose 117, BUN 20, creatinine 0.66, calcium 8.4, anion gap 8, alkaline phosphatase 45, albumin 3.3, lipase 33, AST 22, ALT 13, total protein 6.2, total bilirubin 0.4, BNP one 471.7, troponin 89, WBC 4.3, hemoglobin 7.9, hematocrit 25.7, MCV 117.9, platelet 133, ?CT abdomen: Evidence of fluid overload with moderate right and small to moderate left open and pleural effusions, small volume ascites and mild anasarca.Mild cardiomegaly. Coronary atherosclerosis. No evidence of bowel obstruction or acute bowel inflammation. ? ?Review of Systems:  ?Review of Systems  ?Constitutional:  Positive for malaise/fatigue.  ?HENT: Negative.    ?Eyes: Negative.   ?Respiratory:  Positive for shortness of breath.   ?Cardiovascular:  Positive for leg swelling.  ?Gastrointestinal:  Positive for abdominal pain.  ?Genitourinary: Negative.   ?Musculoskeletal:  Positive for back pain.   ?Skin: Negative.   ?Neurological:  Positive for weakness.  ?Psychiatric/Behavioral:  Positive for depression and memory loss.    ? ?Past Medical History:  ?Diagnosis Date  ? Bilateral carpal tunnel syndrome 07/27/2014  ? Bipolar disorder (HCC)   ? geropsych admission to [facility] in Dec 2018  ? Chronic diastolic CHF (congestive heart failure) (HCC)   ? a. 03/2017 Echo: EF 55-60%, no rwma, Gr1 DD, mild MR; b. 06/2019 Echo: EF 55-60%, no rwma, mod LVH, GrII DD. Nl RV size/fxn. RVSP 54.60mmHg. Mildly dil LA.  ? Chronic low back pain 07/29/2014  ? Degenerative arthritis of lumbar spine 07/27/2014  ? Dementia (HCC)   ? Depression   ? Esophageal spasm 07/29/2014  ? Gastric reflux 09/17/2014  ? GERD (gastroesophageal reflux disease)   ? History of stress test   ? a. 04/2017 MV: low risk stress test.  EF 55-65%. Probable apical ant/apical, basal, and mid inflat attenuation artifact vs ischemia/scar.  ? Hyperlipemia 07/29/2014  ? Hypertension   ? Pneumonia   ? Spinal stenosis 07/29/2014  ? ?Past Surgical History:  ?Procedure Laterality Date  ? AMPUTATION TOE Right 06/07/2021  ? Procedure: AMPUTATION TOE;  Surgeon: Felecia Shelling, DPM;  Location: [facility] ORS;  Service: Podiatry;  Laterality: Right;  ? HIP SURGERY Right   ? INNER EAR SURGERY Right   ? LOWER EXTREMITY ANGIOGRAPHY N/A 06/06/2021  ? Procedure: Lower Extremity Angiography;  Surgeon: Annice Needy, MD;  Location: [facility] INVASIVE CV LAB;  Service: Cardiovascular;  Laterality: N/A;  ? REPLACEMENT TOTAL KNEE BILATERAL Bilateral 07/27/14  ? ?Social History:  reports that she quit smoking about 45 years ago. Her smoking use  included cigarettes. She has never used smokeless tobacco. She reports that she does not drink alcohol and does not use drugs. ? ?Allergies  ?Allergen Reactions  ? Aspirin Other (See Comments)  ?  Unknown reaction  ? Sulfa Antibiotics Other (See Comments)  ?  Unknown reaction  ? Heparin Other (See Comments)  ?  HIT - f/u with [facility].   ? ? ?Family History  ?Problem  Relation Age of Onset  ? Hypertension Mother   ? Stroke Mother   ? ? ?Prior to Admission medications   ?Medication Sig Start Date End Date Taking? Authorizing Provider  ?acetaminophen (TYLENOL) 325 MG tablet Take 2 tablets (650 mg total) by mouth every 6 (six) hours as needed for mild pain. 06/13/21   Osvaldo ShipperKrishnan, Gokul, MD  ?albuterol (VENTOLIN HFA) 108 (90 Base) MCG/ACT inhaler Inhale 2 puffs into the lungs every 6 (six) hours as needed for wheezing or shortness of breath. 04/14/21   Darlin DropHall, Carole N, DO  ?apixaban (ELIQUIS) 5 MG TABS tablet Take 10mg  (2 tablets) twice daily for 4 days, then start 5mg  twice daily. ?Patient taking differently: Take 5 mg by mouth 2 (two) times daily. 06/14/21   Osvaldo ShipperKrishnan, Gokul, MD  ?ascorbic acid (VITAMIN C) 500 MG tablet Take 1 tablet (500 mg total) by mouth 2 (two) times daily. 06/13/21   Osvaldo ShipperKrishnan, Gokul, MD  ?benzonatate (TESSALON PERLES) 100 MG capsule Take 1 capsule (100 mg total) by mouth every 6 (six) hours as needed for cough. 04/14/21 04/14/22  Darlin DropHall, Carole N, DO  ?cholecalciferol (VITAMIN D) 1000 units tablet Take 1,000 Units by mouth daily.    [provider]  ?clopidogrel (PLAVIX) 75 MG tablet Take 1 tablet (75 mg total) by mouth daily. 06/14/21   Osvaldo ShipperKrishnan, Gokul, MD  ?Lactobacillus Acid-Pectin (ACIDOPHILUS/PECTIN) CAPS Take by mouth.    [provider]  ?magnesium oxide (MAG-OX) 400 (240 Mg) MG tablet Take 1 tablet (400 mg total) by mouth 2 (two) times daily. 06/14/21   Osvaldo ShipperKrishnan, Gokul, MD  ?megestrol (MEGACE) 400 MG/10ML suspension Take 10 mLs (400 mg total) by mouth daily. 06/14/21   Osvaldo ShipperKrishnan, Gokul, MD  ?metoprolol succinate (TOPROL-XL) 25 MG 24 hr tablet Take 1 tablet (25 mg total) by mouth daily. 06/13/21   Osvaldo ShipperKrishnan, Gokul, MD  ?mirtazapine (REMERON) 7.5 MG tablet Take 1 tablet (7.5 mg total) by mouth at bedtime. 06/13/21   Osvaldo ShipperKrishnan, Gokul, MD  ?Multiple Vitamin (MULTIVITAMIN WITH MINERALS) TABS tablet Take 1 tablet by mouth daily. 06/14/21   Osvaldo ShipperKrishnan, Gokul, MD   ?Nutritional Supplements (,FEEDING SUPPLEMENT, PROSOURCE PLUS) liquid Take 30 mLs by mouth 3 (three) times daily between meals. 06/13/21   Osvaldo ShipperKrishnan, Gokul, MD  ?omeprazole (PRILOSEC) 20 MG capsule Take 20 mg by mouth 2 (two) times daily before a meal.     [provider]  ?oxyCODONE (OXY IR/ROXICODONE) 5 MG immediate release tablet Take 1 tablet (5 mg total) by mouth every 6 (six) hours as needed for moderate pain. 06/13/21   Osvaldo ShipperKrishnan, Gokul, MD  ?polyethylene glycol (MIRALAX) 17 g packet Take 17 g by mouth daily as needed for mild constipation. 04/14/21   Darlin DropHall, Carole N, DO  ?Probiotic Product (PROBIOTIC DAILY PO) Take by mouth daily.    [provider]  ?senna-docusate (SENOKOT-S) 8.6-50 MG tablet Take 1 tablet by mouth 2 (two) times daily. Stool softener 12/17/20 12/17/21  Pokhrel, Rebekah ChesterfieldLaxman, MD  ?simvastatin (ZOCOR) 40 MG tablet Take 40 mg by mouth at bedtime.     [provider]  ?sucralfate (CARAFATE)  1 g tablet Take 1 tablet (1 g total) by mouth 4 (four) times daily -  with meals and at bedtime. 06/13/21   Osvaldo Shipper, MD  ?vitamin B-12 (CYANOCOBALAMIN) 500 MCG tablet Take 1 tablet (500 mcg total) by mouth daily. 12/17/20 12/17/21  Pokhrel, Rebekah Chesterfield, MD  ?zinc sulfate 220 (50 Zn) MG capsule Take 1 capsule (220 mg total) by mouth daily. 06/14/21   Osvaldo Shipper, MD  ? ? ?Physical Exam: ?Vitals:  ? 07/11/21 1359 07/11/21 1400  ?BP: 111/67   ?Pulse: 86   ?Resp: 16   ?Temp: 97.9 ?F (36.6 ?C)   ?SpO2: 100%   ?Weight:  53.9 kg  ?Height:  5' (1.524 m)  ? ?General exam: Appears comfortable, deconditioned, frail, not in any distress ?Respiratory system: Bibasilar crackles, normal respiratory effort, no accessory muscle use. ?Cardiovascular system: S1-S2 heard, regular rate and rhythm, murmur+ ?Gastrointestinal system: Abdominal tenderness+, soft, nondistended, BS+. ?Central nervous system: Alert, oriented x1, no focal neurological deficits ?Extremities: Edema+, no cyanosis, no clubbing ?Psychiatry:  Mood appropriate, insight judgment appropriate ? ?Data Reviewed: ?I have Reviewed nursing notes, Vitals, and Lab results since pt's last encounter. Pertinent lab results CBC, BMP, BNP, Trope ?I have ord

## 2021-07-11 NOTE — Assessment & Plan Note (Addendum)
Patient does have dementia with intermittent behavioral problems. ?-Delirium precautions ? ?

## 2021-07-11 NOTE — ED Triage Notes (Signed)
FIRST NURSE NOTE:  ?Pt via EMS from [facility]. Pt c/o upper abd pain, unsure how long it has been going on. Pt has hx dementia but it at her baseline. Pt is chronically on 3L  ? ?123/48  ?98.3  ?80 HR ?99% on 3L [place]  ?16 RR  ?208 CBG ?

## 2021-07-11 NOTE — ED Notes (Signed)
Pt spouse [name] called and given update on status ?

## 2021-07-11 NOTE — ED Notes (Signed)
IV line infiltrated, new access attempts unsuccessful, IV team consult ?

## 2021-07-11 NOTE — Assessment & Plan Note (Addendum)
CTA/P: shows pleural effusion on both sides, large on right.. ?-Continue Lasix 40 mg IV daily. ?IR consulted for possible thoracocentesis. ?

## 2021-07-11 NOTE — ED Notes (Signed)
IV team at bedside 

## 2021-07-11 NOTE — Progress Notes (Signed)
? ? ?Manufacturing engineer ?Community Palliative Care Consult Note ?Telephone: [phone]  ?Fax: [phone]  ? ? ?Date of encounter: 07/11/21 ?11: 30 am ?PATIENT NAME: Jenna Dudley ?[place] ?Jenna Dudley   ?[id number] (home)  ?DOB: Jan 31, 1936 ?MRN: [id number] ?PRIMARY CARE PROVIDER:    ?Jenna Dudley,  ?[address] ?[place] [address] ?[phone] ? ?REFERRING PROVIDER:   ?Jenna Dudley ? ?RESPONSIBLE PARTY:    ?Contact Information   ? ? Name Relation Home Work Mobile  ? Maryfer, Tauzin Spouse [phone]  [phone]  ? [place] Daughter [phone]  [phone]  ? ?  ? ? ?I met [facility] in [facility]. Palliative Care was asked to follow this patient by consultation request of  Jenna Dudley to address advance care planning and complex medical decision making. This is a follow up visit. ? ?                                 ASSESSMENT AND PLAN / RECOMMENDATIONS:  ? ?Advance Care Planning/Goals of Care: Goals include to maximize quality of life and symptom management. Patient/health care surrogate gave his/her permission to discuss.Our advance care planning conversation included a discussion about:    ? ?Exploration of personal, cultural or spiritual beliefs that might influence medical decisions  ?Exploration of goals of care in the event of a sudden injury or illness  ?CODE STATUS: FULL ? ?Symptom Management/Plan: ?Met with patient this am. Upon my arrival she stated she felt very badly. She has been guarding her abdomen, r and l sides. RR rate was 25-30 with labor. She reiterated she felt bad but could not elucidate.  I consulted with facility NP and Dudley. They felt work up was indicted to include labs and imaging. Pt has urinalysis pending from Friday at [facility]. They will f/u with letting ED know. Discussed with patient goals of care. She wanted to not be put through anything uncomfortable and wants to return to SNF. Dudley and FNP  consulted with poa, and sent pt to ed for further assessment of pain. She had been given a laxative earlier today and had active bowel sounds. She endorses back pain ( chronic). ? ?Follow up Palliative Care Visit: Palliative care will continue to follow for complex medical decision making, advance care planning, and clarification of goals. Return 1-2 weeks or prn. ? ?I spent 35 minutes providing this consultation. More than 50% of the time in this consultation was spent in counseling and care coordination. ? ?PPS: 40% ? ?HOSPICE ELIGIBILITY/DIAGNOSIS: TBD ? ?Chief Complaint: debility, abdominal pain ? ?HISTORY OF PRESENT ILLNESS:  Jenna Dudley is a 86 y.o. year old female  with debility, malnutrition, CHF, abdominal pain . Patient seen today to review palliative care needs to include medical decision making and advance care planning as appropriate.  ? ?History obtained from review of EMR, discussion with primary team, and interview with family, facility staff/caregiver and/or Jenna Dudley.  ?I reviewed available labs, medications, imaging, studies and related documents from the EMR.  Records reviewed and summarized above.  ? ?ROS ? ? ?General: NAD ?ENMT: denies dysphagia ?Cardiovascular: denies chest pain, endorses DOE ?Pulmonary: denies cough, endorses increased SOB ?Abdomen: endorses poor appetite,  endorses  constipation, endorses incontinence of bowel ?GU: denies dysuria, endorses incontinence of urine ?MSK: endorses increased weakness,  no falls reported ?Skin: denies rashes or wounds ?Neurological: endorses back  pain, denies insomnia ?Psych: Endorses depressed/anxious  mood ? ?Physical Exam: ?Current and past weights: 95 lbs ?Constitutional: NAD ?General: frail appearing, thin ?EYES: anicteric sclera, lids intact, no discharge  ?ENMT: intact hearing, oral mucous membranes moist ?CV: S1S2,irreg rate , 1+ L LE edema, neg R LE edema ?Pulmonary: LCTA, L>R,+ increased work of breathing, no cough, oxygen 2.5 l,  PO2=96% ?Abdomen: intake 10%, normo-active BS + 4 quadrants, soft and + tender, no ascites ?MSK: + sarcopenia, moves all extremities, ambulatory with stand by ?Skin: warm and dry, no rashes or wounds on visible skin ?Neuro:  + generalized weakness,  mod  cognitive impairment, anxious affect ? ? ?Thank you for the opportunity to participate in the care of Jenna Dudley.  The palliative care team will continue to follow. Please call our office at [phone] if we can be of additional assistance.  ? ?Jenna Coop, NP DNP, AGPCNP-BC ? ?COVID-19 PATIENT SCREENING TOOL ?Asked and negative response unless otherwise noted:  ? ?Have you had symptoms of covid, tested positive or been in contact with someone with symptoms/positive test in the past 5-10 days?  ? ?

## 2021-07-11 NOTE — Assessment & Plan Note (Addendum)
She presented with abdominal pain, shortness of breath, hypoxia.Marland Kitchen ?She was hypoxic requiring 3 L of oxygen slightly higher from her baseline of 2 L. ?BNP 1417, bibasilar crackles on exam, edema+ ?Likely acute diastolic CHF exacerbation. Other differential diagnosis include pneumonia. ?CT Abdomen showed findings consistent with atelectasis versus pneumonia. ?She was started on ceftriaxone and Zithromax. ?Procalcitonin negative, remained afebrile, no leukocytosis. ?Echocardiogram with severe pulmonary hypertension. ?-Discontinue antibiotics ?-Continue with IV diuresis ?-Going for thoracentesis today ?-Now at baseline oxygen requirement of 2 L ?

## 2021-07-12 ENCOUNTER — Inpatient Hospital Stay: Payer: Medicare Other

## 2021-07-12 DIAGNOSIS — J9601 Acute respiratory failure with hypoxia: Secondary | ICD-10-CM | POA: Diagnosis not present

## 2021-07-12 DIAGNOSIS — I272 Pulmonary hypertension, unspecified: Secondary | ICD-10-CM

## 2021-07-12 DIAGNOSIS — I5033 Acute on chronic diastolic (congestive) heart failure: Secondary | ICD-10-CM | POA: Diagnosis not present

## 2021-07-12 LAB — CBC
HCT: 27.2 % — ABNORMAL LOW (ref 36.0–46.0)
Hemoglobin: 8.5 g/dL — ABNORMAL LOW (ref 12.0–15.0)
MCH: 36.5 pg — ABNORMAL HIGH (ref 26.0–34.0)
MCHC: 31.3 g/dL (ref 30.0–36.0)
MCV: 116.7 fL — ABNORMAL HIGH (ref 80.0–100.0)
Platelets: 167 10*3/uL (ref 150–400)
RBC: 2.33 MIL/uL — ABNORMAL LOW (ref 3.87–5.11)
RDW: 22.5 % — ABNORMAL HIGH (ref 11.5–15.5)
WBC: 4.1 10*3/uL (ref 4.0–10.5)
nRBC: 0 % (ref 0.0–0.2)

## 2021-07-12 LAB — COMPREHENSIVE METABOLIC PANEL
ALT: 12 U/L (ref 0–44)
AST: 19 U/L (ref 15–41)
Albumin: 3.4 g/dL — ABNORMAL LOW (ref 3.5–5.0)
Alkaline Phosphatase: 41 U/L (ref 38–126)
Anion gap: 7 (ref 5–15)
BUN: 18 mg/dL (ref 8–23)
CO2: 36 mmol/L — ABNORMAL HIGH (ref 22–32)
Calcium: 8.2 mg/dL — ABNORMAL LOW (ref 8.9–10.3)
Chloride: 95 mmol/L — ABNORMAL LOW (ref 98–111)
Creatinine, Ser: 0.61 mg/dL (ref 0.44–1.00)
GFR, Estimated: >60 mL/min (ref >60)
Glucose, Bld: 89 mg/dL (ref 70–99)
Potassium: 4 mmol/L (ref 3.5–5.1)
Sodium: 138 mmol/L (ref 135–145)
Total Bilirubin: 0.6 mg/dL (ref 0.3–1.2)
Total Protein: 6.4 g/dL — ABNORMAL LOW (ref 6.5–8.1)

## 2021-07-12 LAB — MRSA NEXT GEN BY PCR, NASAL: MRSA by PCR Next Gen: NOT DETECTED

## 2021-07-12 LAB — URINALYSIS, ROUTINE W REFLEX MICROSCOPIC
Bilirubin Urine: NEGATIVE
Glucose, UA: NEGATIVE mg/dL
Hgb urine dipstick: NEGATIVE
Ketones, ur: NEGATIVE mg/dL
Leukocytes,Ua: NEGATIVE
Nitrite: NEGATIVE
Protein, ur: NEGATIVE mg/dL
Specific Gravity, Urine: 1.012 (ref 1.005–1.030)
pH: 7 (ref 5.0–8.0)

## 2021-07-12 LAB — PHOSPHORUS: Phosphorus: 3.7 mg/dL (ref 2.5–4.6)

## 2021-07-12 LAB — MAGNESIUM: Magnesium: 1.8 mg/dL (ref 1.7–2.4)

## 2021-07-12 MED ORDER — PROSOURCE PLUS PO LIQD
30.0000 mL | Freq: Three times a day (TID) | ORAL | Status: DC
  Administered 2021-07-12 – 2021-07-15 (×7): 30 mL via ORAL
  Filled 2021-07-12 (×10): qty 30

## 2021-07-12 MED ORDER — ADULT MULTIVITAMIN W/MINERALS CH
1.0000 | ORAL_TABLET | Freq: Every day | ORAL | Status: DC
  Administered 2021-07-12 – 2021-07-15 (×4): 1 via ORAL
  Filled 2021-07-12 (×4): qty 1

## 2021-07-12 NOTE — Evaluation (Signed)
Occupational Therapy Evaluation ?Patient Details ?Name: Jenna Dudley ?MRN: [id number] ?DOB: May 05, 1935 ?Today's Date: 07/12/2021 ? ? ?History of Present Illness 86 y.o. female with PMH significant of chronic diastolic CHF, dementia, depression, GERD, hyperlipidemia, hypertension, bipolar disorder, history of PE, PAD s/p R toe amputation, who presents from SNF with lower abdominal pain and shortness of breath. Pt admitted for acute respiratory failure with hypoxia likely d/t acute diastolic CHF exacerbation  ? ?Clinical Impression ?  ?Pt seen for OT evaluation this date. Upon arrival to room, pt awake and seated upright in bed with pt's spouse present. Pt alert and oriented to self and place; disoriented to situation. Pt's spouse reported that while at SNF, pt progressed to performing functional mobility and ADLs MOD-I and was scheduled to discharge from SNF on 4/18 with shower chair and HHPT. Pt on 2 liters of O2 at home. Pt required SUPERVISION for bed mobility. While seated EOB, pt noted to be solid in urine. Pt required SUPERVISION/SET-UP for LB bathing (with b/l LE elevated while seated in recliner), MOD A for seated LB dressing, SUPERVISION/SET-UP for seated UB dressing, and MIN GUARD to walk 7ft with RW d/t decreased balance, decreased activity tolerance, and significant abdominal pain. Pt would benefit from additional skilled OT services to maximize return to PLOF and minimize risk of future falls, injury, caregiver burden, and readmission. Upon discharge, recommend HHOT services.     ? ?Recommendations for follow up therapy are one component of a multi-disciplinary discharge planning process, led by the attending physician.  Recommendations may be updated based on patient status, additional functional criteria and insurance authorization.  ? ?Follow Up Recommendations ? Home health OT  ?  ?Assistance Recommended at Discharge Frequent or constant Supervision/Assistance  ?Patient can return home with the  following A little help with walking and/or transfers;A little help with bathing/dressing/bathroom;Assistance with cooking/housework;Assist for transportation;Help with stairs or ramp for entrance ? ?  ?Functional Status Assessment ? Patient has had a recent decline in their functional status and demonstrates the ability to make significant improvements in function in a reasonable and predictable amount of time.  ?Equipment Recommendations ? Tub/shower seat  ?  ?   ?Precautions / Restrictions Precautions ?Precautions: Fall ?Restrictions ?Weight Bearing Restrictions: No ?Other Position/Activity Restrictions: Spouse reports that pt no longer has weightbearing precautions and no longer needed to wear surgical shoe  ? ?  ? ?Mobility Bed Mobility ?Overal bed mobility: Needs Assistance ?Bed Mobility: Supine to Sit ?  ?  ?Supine to sit: Supervision, HOB elevated ?  ?  ?General bed mobility comments: Requires increased effort 2/2 abdominal pain ?  ? ?Transfers ?Overall transfer level: Needs assistance ?Equipment used: Rolling walker (2 wheels) ?Transfers: Sit to/from Stand ?Sit to Stand: Min guard ?  ?  ?  ?  ?  ?  ?  ? ?  ?Balance Overall balance assessment: Needs assistance ?Sitting-balance support: No upper extremity supported, Feet supported ?Sitting balance-Leahy Scale: Fair ?Sitting balance - Comments: Requires SUPERVISION for reaching within BOS ?  ?Standing balance support: Bilateral upper extremity supported, During functional activity ?Standing balance-Leahy Scale: Fair ?Standing balance comment: Requires MIN GUARD for functional mobility of short household distances with b/l UE supported by RW ?  ?  ?  ?  ?  ?  ?  ?  ?  ?  ?  ?   ? ?ADL either performed or assessed with clinical judgement  ? ?ADL Overall ADL's : Needs assistance/impaired ?  ?  ?Grooming: Wash/dry hands;Set  up;Sitting ?  ?  ?  ?Lower Body Bathing: Supervison/ safety;Set up;Cueing for sequencing;Sitting/lateral leans ?Lower Body Bathing Details  (indicate cue type and reason): Requires verbal cues to ensure thoroughness ?Upper Body Dressing : Supervision/safety;Set up;Sitting ?Upper Body Dressing Details (indicate cue type and reason): to don/doff hospital gown ?Lower Body Dressing: Moderate assistance;Sitting/lateral leans ?Lower Body Dressing Details (indicate cue type and reason): pt able to don R sock, however required physical assist to don L sock 2/2 significant abdominal pain ?  ?  ?  ?  ?  ?  ?Functional mobility during ADLs: Min guard;Rolling walker (2 wheels);Cueing for safety (to walk 7612ft) ?   ? ? ? ?Vision Ability to See in Adequate Light: 0 Adequate ?Patient Visual Report: No change from baseline ?   ?   ?   ?   ? ?Pertinent Vitals/Pain Pain Assessment ?Pain Assessment: PAINAD ?Breathing: occasional labored breathing, short period of hyperventilation ?Negative Vocalization: none ?Facial Expression: sad, frightened, frown ?Body Language: relaxed ?Consolability: distracted or reassured by voice/touch ?PAINAD Score: 3 ?Pain Location: abdomen ?Pain Descriptors / Indicators: Aching, Discomfort ?Pain Intervention(s): Monitored during session, Repositioned, Patient requesting pain meds-RN notified  ? ? ? ?   ?Extremity/Trunk Assessment Upper Extremity Assessment ?Upper Extremity Assessment: Generalized weakness ?  ?Lower Extremity Assessment ?Lower Extremity Assessment: Generalized weakness ?  ?  ?  ?Communication Communication ?Communication: HOH ?  ?Cognition Arousal/Alertness: Awake/alert ?Behavior During Therapy: Anxious ?Overall Cognitive Status: History of cognitive impairments - at baseline ?  ?  ?  ?  ?  ?  ?  ?  ?  ?  ?  ?  ?  ?  ?  ?  ?General Comments: alert and oriented to self and place. Disoriented to situation. Anxious regarding abdominal pain and OOB mobiloty ?  ?  ?   ?   ?   ? ? ?Home Living Family/patient expects to be discharged to:: Private residence ?Living Arrangements: Spouse/significant other ?Available Help at Discharge:  Family;Available 24 hours/day ?Type of Home: House ?Home Access: Stairs to enter ?Entrance Stairs-Number of Steps: 3 steps from front, level entry from garage ?Entrance Stairs-Rails: Right ?Home Layout: One level ?  ?  ?Bathroom Shower/Tub: Tub/shower unit;Walk-in shower ?  ?Bathroom Toilet: Handicapped height ?  ?  ?Home Equipment: Cane - single point;Rolling Walker (2 wheels);Grab bars - tub/shower;BSC/3in1;Other (comment) (pulse ox, BP machine) ?  ?Additional Comments: Pt was at SNF prior to admission (was scheduled to d/c 4/18). [facility] was planning to deliver shower chair ?  ? ?  ?Prior Functioning/Environment Prior Level of Function : Independent/Modified Independent ?  ?  ?  ?  ?  ?  ?Mobility Comments: While at SNF, pt progressed to being MOD-I with RW for functional mobility ?ADLs Comments: While at SNF, pt progressed to being MOD-I with ADLs ?  ? ?  ?  ?OT Problem List: Decreased strength;Decreased activity tolerance;Impaired balance (sitting and/or standing);Decreased safety awareness;Pain;Decreased knowledge of precautions ?  ?   ?OT Treatment/Interventions: Self-care/ADL training;Therapeutic exercise;Energy conservation;DME and/or AE instruction;Therapeutic activities;Patient/family education;Balance training  ?  ?OT Goals(Current goals can be found in the care plan section) Acute Rehab OT Goals ?Patient Stated Goal: to return home ?OT Goal Formulation: With patient/family ?Time For Goal Achievement: 07/26/21 ?Potential to Achieve Goals: Good ?ADL Goals ?Pt Will Perform Grooming: with modified independence;standing ?Pt Will Perform Lower Body Dressing: with modified independence;sit to/from stand ?Pt Will Transfer to Toilet: with modified independence;regular height toilet  ?OT Frequency: Min 2X/week ?  ? ?   ?  AM-PAC OT "6 Clicks" Daily Activity     ?Outcome Measure Help from another person eating meals?: None ?Help from another person taking care of personal grooming?: A Little ?Help from  another person toileting, which includes using toliet, bedpan, or urinal?: A Little ?Help from another person bathing (including washing, rinsing, drying)?: A Little ?Help from another person to put on and taking off r

## 2021-07-12 NOTE — Progress Notes (Signed)
?Progress Note ? ? ?Patient: Jenna Dudley MGQ:[id number] DOB: Jul 28, 1935 DOA: 07/11/2021     1 ? ?DOS: the patient was seen and examined on 07/12/2021 ?  ?Brief hospital course: ?This 86 years old female with PMH significant for chronic diastolic CHF with recent echo shows LVEF 55 to 60%, dementia, depression, GERD, hyperlipidemia, hypertension, history of PE on Eliquis was sent from SNF with concern about UTI.  Patient has developed lower abdominal pain associated with shortness of breath, SNF staff thought patient might have  UTI. UA unremarkable but patient has developed worsening abdominal pain.  Work-up in the ED showed BNP 1417, CT abdomen and pelvis shows fluid overload,  moderate right, small to moderate left pleural effusion. ?Patient is admitted for acute on chronic hypoxic respiratory failure secondary to CHF exacerbation.  Cardiology is consulted.  IR consulted for possible thoracocentesis. ? ? ?Assessment and Plan: ?* Acute respiratory failure with hypoxia (HCC) ?She presented with abdominal pain, shortness of breath, hypoxia.Marland Kitchen ?She was hypoxic requiring 3 L of oxygen slightly higher from her baseline of 2 L. ?BNP 1417, bibasilar crackles on exam, edema+ ?Likely acute diastolic CHF exacerbation. Other differential diagnosis include pneumonia. ?CT Abdomen showed findings consistent with atelectasis versus pneumonia. ?Continue empiric antibiotics ceftriaxone and Zithromax. ?Procalcitonin negative, remained afebrile, no leukocytosis. ?Consider discontinue antibiotics. ?Patient received Lasix 40 mg IV once,  ?Continue Lasix 40 mg IV daily ?Monitor daily weight, intake output charting. ?Echocardiogram last month shows LVEF 55 to 60% no RWMA. ? ?Pleural effusion on right ?CTA/P: shows pleural effusion on both sides, large on right.Marland Kitchen ?Continue Lasix 40 mg IV daily. ?Cardiology consulted, recommended to continue IV Lasix. ?IR consulted for possible thoracocentesis. ? ?Pulmonary emboli (HCC) ?Continue  Eliquis. ? ?Thrombocytopenia (HCC) ?She does have chronic thrombocytopenia. ?Her platelet count is at baseline. ? ?Anemia of chronic disease ?There is no obvious bleeding noted. ?Monitor H&H.  Transfusion threshold 7.0 ? ?Delirium with dementia ?Patient does have dementia with intermittent behavioral problems. ? ? ?CHF exacerbation (HCC) ?Patient shows symptoms consistent with CHF exacerbation.   ?Elevated BNP, hypoxia, Pedal edema, bibasilar crackles. ?Echocardiogram last month showed LVEF 55 to 60%. ?Continue Lasix 40 mg IV daily. ?Resume home blood pressure medications include ACE inhibitors. ? ?Depression ?Continue Remeron. ? ? ?Subjective: Patient was seen and examined at bedside.  Overnight events noted. ?Patient reports feeling better , still has some shortness of breath, denies any chest pain. ? ?Physical Exam: ?Vitals:  ? 07/12/21 0023 07/12/21 0347 07/12/21 0735 07/12/21 1217  ?BP: 118/66 (!) 126/55 (!) 143/92 135/88  ?Pulse: 88 83 (!) 59 80  ?Resp: 18 18 18 18   ?Temp: 97.9 ?F (36.6 ?C) 97.7 ?F (36.5 ?C) 97.9 ?F (36.6 ?C) 97.8 ?F (36.6 ?C)  ?TempSrc: Axillary Axillary Oral   ?SpO2: 99% 97% 96% 97%  ?Weight:      ?Height:      ? ?General exam: Appears comfortable, deconditioned, frail, not in any distress. ?Respiratory system: Bibasilar crackles, normal respiratory effort, no accessory muscle use. ?Cardiovascular system: S1-S2 heard, regular rate and rhythm, murmur+ ?Gastrointestinal system: Abdominal tenderness +, soft, nondistended, BS+. ?Central nervous system: Alert, oriented x1, no focal neurological deficits ?Extremities: Edema+, no cyanosis, no clubbing ?Psychiatry: Mood appropriate, insight judgment appropriate. ? ?Data Reviewed: ?I have Reviewed nursing notes, Vitals, and Lab results since pt's last encounter. Pertinent lab results CBC, BMP, BNP ?I have ordered test including CBC, BMP, BNP ?I have reviewed the last note from cardiologist,  ?I have discussed pt's care plan and  test results with  patient.  ? ?Family Communication: Husband at bedside ? ?Disposition: ?Status is: Inpatient ?Remains inpatient appropriate because:  ?Admitted for acute on chronic hypoxic respiratory failure secondary to fluid overload, CHF exacerbation.  Requiring IV diuresis.  Also has pleural effusion. IR consulted for thoracocentesis ? ? Planned Discharge Destination: Home ? ? ? ? ?Time spent: 50 minutes ? ?Author: ?Cipriano Bunker, MD ?07/12/2021 2:26 PM ? ?For on call review [email or web address].  ?

## 2021-07-12 NOTE — Evaluation (Signed)
Physical Therapy Evaluation ?Patient Details ?Name: Jenna Dudley ?MRN: [id number] ?DOB: April 24, 1935 ?Today's Date: 07/12/2021 ? ?History of Present Illness ? Pt is an 86 y.o. female presenting to hospital 4/17 with c/o abdominal pain with SOB.  H/o R 3rd toe amp 06/07/21.  Pt admitted with acute respiratory failure with hypoxia, R pleural effusion, and CHF exacerbation.  PMH includes dementia, h/o PE on Eliquis, hip sx, inner ear sx, B TKR 2016, reflux, chronic pain, spinal stenosis, CHF, dementia, and bipolar disorder.  ?Clinical Impression ? Prior to hospital admission, pt was at SNF (had progressed to modified independent ambulating with RW and was about to be discharged home); lives with her husband in 1 level home with level entry.  Currently pt is CGA with transfers and ambulation 30 feet with RW (pt appearing very anxious and was hesitant to walk with therapist but did ambulate with encouragement from her husband).  Pt became very anxious with ambulation limiting distance able to ambulate.  Pt would benefit from skilled PT to address noted impairments and functional limitations (see below for any additional details).  Upon hospital discharge, pt would benefit from HHPT and 24/7 assist (pt's husband reports he is able to provide needed assist for safe home discharge).   ? ?Recommendations for follow up therapy are one component of a multi-disciplinary discharge planning process, led by the attending physician.  Recommendations may be updated based on patient status, additional functional criteria and insurance authorization. ? ?Follow Up Recommendations Home health PT ? ?  ?Assistance Recommended at Discharge Frequent or constant Supervision/Assistance  ?Patient can return home with the following ? A little help with walking and/or transfers;A little help with bathing/dressing/bathroom;Assistance with cooking/housework;Assist for transportation;Help with stairs or ramp for entrance ? ?  ?Equipment Recommendations  Rolling walker (2 wheels)  ?Recommendations for Other Services ?    ?  ?Functional Status Assessment Patient has had a recent decline in their functional status and demonstrates the ability to make significant improvements in function in a reasonable and predictable amount of time.  ? ?  ?Precautions / Restrictions Precautions ?Precautions: Fall ?Restrictions ?Weight Bearing Restrictions: No ?Other Position/Activity Restrictions: Spouse reports that pt no longer has weightbearing precautions and no longer needed to wear surgical shoe (s/p R 3rd toe amp 06/07/21)  ? ?  ? ?Mobility ? Bed Mobility ?  ?  ?  ?  ?  ?  ?  ?General bed mobility comments: Deferred (pt in recliner beginning/end of session) ?  ? ?Transfers ?Overall transfer level: Needs assistance ?Equipment used: Rolling walker (2 wheels) ?Transfers: Sit to/from Stand, Bed to chair/wheelchair/BSC ?Sit to Stand: Min guard (with RW) ?  ?Step pivot transfers: Min guard (stand step turn recliner to/[facility]) ?  ?  ?  ?General transfer comment: mild increased effort to stand up to RW ?  ? ?Ambulation/Gait ?Ambulation/Gait assistance: Min guard ?Gait Distance (Feet): 30 Feet ?Assistive device: Rolling walker (2 wheels) ?  ?Gait velocity: decreased ?  ?  ?General Gait Details: partial to step through gait pattern; steady with RW use; mild flexed trunk posture ? ?Stairs ?  ?  ?  ?  ?  ? ?Wheelchair Mobility ?  ? ?Modified Rankin (Stroke Patients Only) ?  ? ?  ? ?Balance Overall balance assessment: Needs assistance ?Sitting-balance support: No upper extremity supported, Feet supported ?Sitting balance-Leahy Scale: Good ?Sitting balance - Comments: steady sitting reaching within BOS ?  ?Standing balance support: Bilateral upper extremity supported, During functional activity ?Standing balance-Leahy  Scale: Good ?Standing balance comment: steady ambulating with RW ?  ?  ?  ?  ?  ?  ?  ?  ?  ?  ?  ?   ? ? ? ?Pertinent Vitals/Pain Pain Assessment ?Pain Assessment:  Faces ?Faces Pain Scale: No hurt ?Pain Intervention(s): Limited activity within patient's tolerance, Monitored during session, Repositioned ?Vitals (HR and O2 on 2 L via nasal cannula) stable and WFL throughout treatment session.  ? ? ?Home Living Family/patient expects to be discharged to:: Private residence ?Living Arrangements: Spouse/significant other ?Available Help at Discharge: Family;Available 24 hours/day ?Type of Home: House ?Home Access: Stairs to enter ?Entrance Stairs-Rails: Right ?Entrance Stairs-Number of Steps: 3 steps from front; level entry from garage ?  ?Home Layout: One level ?Home Equipment: Cane - single point;Rolling Walker (2 wheels);Grab bars - tub/shower;BSC/3in1;Other (comment) (pulse oximeter; BP machine) ?Additional Comments: Pt was at SNF prior to admission (was scheduled to d/c 4/18). [facility] was planning to deliver shower chair  ?  ?Prior Function Prior Level of Function : Independent/Modified Independent ?  ?  ?  ?  ?  ?  ?Mobility Comments: While at SNF, pt was ambulating modified independently with RW. ?ADLs Comments: Per OT eval "While at SNF, pt progressed to being MOD-I with ADLs". ?  ? ? ?Hand Dominance  ?   ? ?  ?Extremity/Trunk Assessment  ? Upper Extremity Assessment ?Upper Extremity Assessment: Generalized weakness ?  ? ?Lower Extremity Assessment ?Lower Extremity Assessment: Generalized weakness ?  ? ?Cervical / Trunk Assessment ?Cervical / Trunk Assessment: Kyphotic  ?Communication  ? Communication: HOH  ?Cognition Arousal/Alertness: Awake/alert ?Behavior During Therapy: Anxious ?Overall Cognitive Status: History of cognitive impairments - at baseline ?  ?  ?  ?  ?  ?  ?  ?  ?  ?  ?  ?  ?  ?  ?  ?  ?General Comments: A&O to self and place; disoriented to situation.  Appearing anxious in general during session. ?  ?  ? ?  ?General Comments  Nursing cleared pt for participation in physical therapy.  Pt agreeable to PT session. ? ?  ?Exercises    ? ?Assessment/Plan   ?  ?PT Assessment Patient needs continued PT services  ?PT Problem List Decreased strength;Decreased activity tolerance;Decreased balance;Decreased mobility ? ?   ?  ?PT Treatment Interventions DME instruction;Gait training;Functional mobility training;Therapeutic activities;Therapeutic exercise;Balance training;Patient/family education   ? ?PT Goals (Current goals can be found in the Care Plan section)  ?Acute Rehab PT Goals ?Patient Stated Goal: to go home ?PT Goal Formulation: With patient/family ?Time For Goal Achievement: 07/26/21 ?Potential to Achieve Goals: Good ? ?  ?Frequency Min 2X/week ?  ? ? ?Co-evaluation   ?  ?  ?  ?  ? ? ?  ?AM-PAC PT "6 Clicks" Mobility  ?Outcome Measure Help needed turning from your back to your side while in a flat bed without using bedrails?: None ?Help needed moving from lying on your back to sitting on the side of a flat bed without using bedrails?: A Little ?Help needed moving to and from a bed to a chair (including a wheelchair)?: A Little ?Help needed standing up from a chair using your arms (e.g., wheelchair or bedside chair)?: A Little ?Help needed to walk in hospital room?: A Little ?Help needed climbing 3-5 steps with a railing? : A Little ?6 Click Score: 19 ? ?  ?End of Session Equipment Utilized During Treatment: Gait belt ?Activity Tolerance: Other (  comment) (Limited d/t pt very anxious with activity) ?Patient left: in chair;with call bell/phone within reach;with chair alarm set;with family/visitor present ?Nurse Communication: Mobility status;Precautions ?PT Visit Diagnosis: Other abnormalities of gait and mobility (R26.89);Muscle weakness (generalized) (M62.81) ?  ? ?Time: 3846-6599 ?PT Time Calculation (min) (ACUTE ONLY): 25 min ? ? ?Charges:   PT Evaluation ?$PT Eval Low Complexity: 1 Low ?PT Treatments ?$Therapeutic Activity: 8-22 mins ?  ?   ? ?Hendricks Limes, PT ?07/12/21, 4:58 PM ? ? ?

## 2021-07-12 NOTE — Progress Notes (Signed)
[facility] [address] [employer] [facility]) Hospital Liaison note: ? ?This patient is currently enrolled in [facility] outpatient-based Palliative Care. Will continue to follow for disposition. ? ?Please call with any outpatient palliative questions or concerns. ? ?Thank you, ?Abran Cantor, LPN ?[facility] Hospital Liaison ?[phone] ?

## 2021-07-12 NOTE — Progress Notes (Signed)
Initial Nutrition Assessment ? ?DOCUMENTATION CODES:  ? ?Severe malnutrition in context of chronic illness ? ?INTERVENTION:  ? ?-Liberalize diet to regular for widest variety of meal selections ?-Hormel Shake TID with meals, each supplement provides 520 kcals and 22 grams protein ?-30 ml Prosource Plus TID, each supplement provides 100 kcals and 15 grams protein ?-MVI with minerals daily ?-Feeding assistance with meals ? ?NUTRITION DIAGNOSIS:  ? ?Severe Malnutrition related to chronic illness (dementia) as evidenced by severe fat depletion, severe muscle depletion. ? ?GOAL:  ? ?Patient will meet greater than or equal to 90% of their needs ? ?MONITOR:  ? ?PO intake, Supplement acceptance, Labs, Weight trends, Skin, I & O's ? ?REASON FOR ASSESSMENT:  ? ?Rounds ?  ? ?ASSESSMENT:  ? ?Jenna Dudley is a 86 y.o. female with PMH significant of chronic diastolic CHF with recent echo shows LVEF 55 to 60%, dementia, depression, GERD, hyperlipidemia, hypertension, bipolar disorder, history of PE on Eliquis was sent from SNF with concern about UTI.  History is obtained from ED records, Patient has developed lower abdominal pain associated with shortness of breath, SNF staff thought patient might have UTI, denies any fever, nausea, vomiting, cough, diarrhea.  They denied any fall, trauma or injury to the head. ? ?Pt admitted with CHF.  ? ?Reviewed I/O's: +287 ml x 24 hours ? ?UOP: 1.8 L x 24 hours ? ?Case discussed with heart failure RN. Pt husband would like to pt pt home instead of returning to SNF. RN would like for RD to provide education to husband.  ? ?No family present in room. Pt very lethargic at time of visit and did not arouse to voice or touch. Noted lunch tray on tray table untouched.  ? ?Pt currently on a carb modified diet. Noted meal completions 0-10%.  ? ?Pt with very poor oral intake at baseline. She has been minimally accepting of supplements in the past. Noted palliative care has followed pt in the past and  she is active with palliative care services PTA.  ? ?Reviewed wt hx; pt has experienced a 1.7% wt loss over the past 3 months, which is not significant for time frame.  ?  ?Findings discussed with heart failure RN.  ? ?Medications reviewed and include remeron, senokot, and vitamin B-12.  ? ?Labs reviewed: CBGS: 86 (inpatient orders for glycemic control are none).   ? ?NUTRITION - FOCUSED PHYSICAL EXAM: ? ?Flowsheet Row Most Recent Value  ?Orbital Region Severe depletion  ?Upper Arm Region Severe depletion  ?Thoracic and Lumbar Region Severe depletion  ?Buccal Region Severe depletion  ?Temple Region Severe depletion  ?Clavicle Bone Region Severe depletion  ?Clavicle and Acromion Bone Region Severe depletion  ?Scapular Bone Region Severe depletion  ?Dorsal Hand Severe depletion  ?Patellar Region Severe depletion  ?Anterior Thigh Region Severe depletion  ?Posterior Calf Region Severe depletion  ?Edema (RD Assessment) None  ?Hair Reviewed  ?Eyes Reviewed  ?Mouth Reviewed  ?Skin Reviewed  ?Nails Reviewed  ? ?  ? ? ?Diet Order:   ?Diet Order   ? ?       ?  Diet Carb Modified Fluid consistency: Thin; Room service appropriate? Yes  Diet effective now       ?  ? ?  ?  ? ?  ? ? ?EDUCATION NEEDS:  ? ?Not appropriate for education at this time ? ?Skin:  Skin Assessment: Reviewed RN Assessment ? ?Last BM:  07/12/21 ? ?Height:  ? ?Ht Readings from Last 1 Encounters:  ?  07/11/21 5' (1.524 m)  ? ? ?Weight:  ? ?Wt Readings from Last 1 Encounters:  ?07/11/21 50.7 kg  ? ? ?Ideal Body Weight:  45.5 kg ? ?BMI:  Body mass index is 21.83 kg/m?. ? ?Estimated Nutritional Needs:  ? ?Kcal:  1600-1800 ? ?Protein:  85-100 grams ? ?Fluid:  > 1.6 L ? ? ? ?Levada Schilling, RD, LDN, CDCES ?Registered Dietitian II ?Certified Diabetes Care and Education Specialist ?Please refer to [facility] for RD and/or RD on-call/weekend/after hours pager  ?

## 2021-07-12 NOTE — TOC Initial Note (Signed)
Transition of Care (TOC) - Initial/Assessment Note  ? ? ?Patient Details  ?Name: Jenna Dudley ?MRN: [id number] ?Date of Birth: 1935-04-03 ? ?Transition of Care (TOC) CM/SW Contact:    ?Alberteen Sam, LCSW ?Phone Number: ?07/12/2021, 2:30 PM ? ?Clinical Narrative:                 ? ?CSW met with patient's spouse Carloyn Manner at bedside for discharge planning.  ? ?Reports upon leaving [facility], patient was set up with [facility] and RN through [place], [place] confirmed with Corene Cornea with [facility].  ? ?Reports [facility] had ordered a shower bench, CSW has reached out [facility] to confirm, they report they did not order. CSW will order at dc.  ? ?Patient's spouse reports they could use orders for a rolling walker at discharge. Patient does already have a 3in1 at home.  ? ?Patient's spouse Carloyn Manner to transport at time of discharge.  ? ?At dc patient will need orders for: ? ?Tub bench ?3in1 ?Home Health PT, OT, RN ? ?Expected Discharge Plan: [place] ?Barriers to Discharge: Continued Medical Work up ? ? ?Patient Goals and CMS Choice ?Patient states their goals for this hospitalization and ongoing recovery are:: to go home ?CMS Medicare.gov Compare Post Acute Care list provided to:: Patient Represenative (must comment) (spouse Carloyn Manner) ?Choice offered to / list presented to : Spouse ? ?Expected Discharge Plan and Services ?Expected Discharge Plan: [place] ?  ?  ?  ?Living arrangements for the past 2 months: [place] ?                ?DME Arranged: Tub bench, Walker ?  ?  ?  ?  ?HH Arranged: PT, OT, RN ?[place] Agency: [facility] ([place]) ?Date HH Agency Contacted: 07/12/21 ?Time [place]: 1430 ?Representative spoke with at [place]: Corene Cornea ? ?Prior Living Arrangements/Services ?Living arrangements for the past 2 months: [place] ?Lives with:: Spouse ?  ?       ?  ?  ?  ?  ? ?Activities of Daily Living ?Home Assistive Devices/Equipment: Dentures (specify type), Eyeglasses,  Oxygen, Walker (specify type) ?ADL Screening (condition at time of admission) ?Patient's cognitive ability adequate to safely complete daily activities?: No ?Is the patient deaf or have difficulty hearing?: Yes ?Does the patient have difficulty seeing, even when wearing glasses/contacts?: No ?Does the patient have difficulty concentrating, remembering, or making decisions?: Yes ?Patient able to express need for assistance with ADLs?: Yes ?Does the patient have difficulty dressing or bathing?: Yes ?Independently performs ADLs?: No ?Communication: Independent ?Dressing (OT): Needs assistance ?Is this a change from baseline?: Pre-admission baseline ?Grooming: Needs assistance ?Is this a change from baseline?: Pre-admission baseline ?Feeding: Independent ?Bathing: Needs assistance ?Is this a change from baseline?: Pre-admission baseline ?Toileting: Needs assistance ?Is this a change from baseline?: Pre-admission baseline ?In/Out Bed: Needs assistance ?Is this a change from baseline?: Pre-admission baseline ?Walks in Home: Needs assistance ?Is this a change from baseline?: Pre-admission baseline ?Does the patient have difficulty walking or climbing stairs?: Yes ?Weakness of Legs: Both ?Weakness of Arms/Hands: None ? ?Permission Sought/Granted ?  ?  ?   ?   ?   ?   ? ?Emotional Assessment ?  ?  ?  ?  ?  ?  ? ?Admission diagnosis:  Anasarca [R60.1] ?Generalized abdominal pain [R10.84] ?CHF exacerbation ([place]) [I50.9] ?Patient Active Problem List  ? Diagnosis Date Noted  ? Acute  respiratory failure with hypoxia ([place]) 07/11/2021  ? Thrombocytopenia ([place]) 06/08/2021  ? PAD (peripheral artery disease) ([place]) 06/08/2021  ? Protein-calorie malnutrition, severe 06/07/2021  ? Anorexia 06/04/2021  ? Aspiration pneumonia ([place]) 06/02/2021  ? Pulmonary emboli ([place]) 06/02/2021  ? Elevated troponin 06/02/2021  ? Chest pain 06/02/2021  ? Hypomagnesemia 06/02/2021  ? Pleural effusion on right 06/02/2021  ? Anemia of chronic disease  06/02/2021  ? Necrotizing pneumonia ([place])   ? Sepsis ([place])   ? COVID-19 virus infection 04/11/2021  ? Toe osteomyelitis, right ([place]) 04/10/2021  ? Elevated lactic acid level 04/10/2021  ? Hypokalemia 04/10/2021  ? CKD (chronic kidney disease), stage IIIa 04/10/2021  ? Iron deficiency anemia 04/10/2021  ? CHF exacerbation ([place]) 12/10/2020  ? Delirium with dementia   ? HCAP (healthcare-associated pneumonia) 12/18/2017  ? Bipolar 1 disorder ([place]) 05/29/2017  ? CHF (congestive heart failure) ([place]) 04/23/2017  ? Accelerated hypertension 04/21/2017  ? Severe sepsis ([place]) 10/03/2015  ? Hypernatremia 10/03/2015  ? Lactic acidosis 10/03/2015  ? Leukocytosis 10/03/2015  ? Agitation 10/03/2015  ? Severe recurrent major depression without psychotic features ([place])   ? Major depressive disorder, recurrent episode, severe, with psychotic behavior ([place]) 10/13/2014  ? Severe episode of recurrent major depressive disorder, with psychotic features ([place]) 10/13/2014  ? 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 Essential hypertension 09/17/2014  ? Chronic back pain 09/17/2014  ? History of stroke 09/17/2014  ? Severe recurrent major depression with psychotic features ([place])   ? Acute on chronic diastolic CHF (congestive heart failure) ([place]) 07/29/2014  ? Partial small bowel obstruction ([place]) 07/29/2014  ? Hallux abductovalgus with bunions 09/12/2013  ? Hammer toe 09/12/2013  ? Foot pain 09/12/2013  ? Fungal infection of nail 09/12/2013  ? Carpal tunnel syndrome 12/21/2010  ? Chronic low back pain 12/21/2010  ? Degenerative arthritis of lumbar spine 12/21/2010  ? Depression 12/21/2010  ? 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 HLD (hyperlipidemia) 12/21/2010  ? Acid reflux 12/21/2010  ? Spinal stenosis 12/21/2010  ? ?PCP:  Aderoju, Jadene Pierini, MD ?Pharmacy:  No Pharmacies Listed ? ? ? ?Social Determinants of Health (SDOH) Interventions ?  ? ?Readmission Risk Interventions ?   ? View :  No data to display.  ?  ?  ?  ? ? ? ?

## 2021-07-12 NOTE — Consult Note (Signed)
?Cardiology Consultation:  ? ?Patient ID: Jenna Dudley ?MRN: [id number]; DOB: Aug 09, 1935 ? ?Admit date: 07/11/2021 ?Date of Consult: 07/12/2021 ? ?PCP:  Aderoju, Jadene Pierini, MD ?  ?[facility] Providers ?Cardiologist:  Kathlyn Sacramento, MD   { ? ?Patient Profile:  ? ?Jenna Dudley is a 86 y.o. female with a hx of chronic diastolic heart failure, bipolar disorder (requiring ECT), HTH, h/o CVA, GERD, Bipolar 1 disorder, early dementia, PAD s/p R toe amputation who is being seen 07/12/2021 for the evaluation of Acute CHf at the request of Dr. Dwyane Dee. ? ?History of Present Illness:  ? ?History obtained from chart review and patient's husband given the patient's mental status.  ? ?Ms. Lobdell is followed by Dr. Fletcher Anon for the above cardiac issues. Previous echo Jan 2019 showed LVEF 55-60%, G1DD, mild MR. She underwent [facility]  February 2019 with no evidence of ischemia though study was suboptimal. ? ?Admitted 9/16-9/23. Presented with abnomal CT for partial small bowel obstruction and DOE. Hgp 9.5. CT also showed mod right pleural effusion. GI saw and recommended conservative management. Echo showed LVEF 65%. She underwent thoracentesis on the right side negative 885m clear fluid. Patient was started on Lasix.  ? ?Last seen 01/28/21 and was overall doing OK. No changes were made. ? ?Admitted 05/3021 for aspiration PNA, severe sepsis, tow osteomyelitis, PAD, thrombocytopenia, anemia, PE started on anticoagulation. ? ?Saw TDarylene Price3/30/23 complaining of SOB with minimal exertion, euvolemic on exam. Weight at facility 117-118lbs. Med list does not show she was on lasix. ? ?[facility] pain brought from rehab. There is not much detail known about symptoms. Per report they felt the patient was not acting her normal self. Does not appear to complain of chest pain.  ? ?In the ER BP was elevated. Labs showed sodium 136, K4.1, chl 95, bicarb 33, glucose 117, BUN 20, Scr  0.66, calcium 8.4, alk phos 45, albumin 3.3, lipase 3.3, AST 22, ALT 13, BNP 471, HS trop 89. CT abdomen showed evidence of fluid overload with moderate right and small to moderate pleural effusions, small volume ascites and mild anasarca, mild cardiomegaly, coronary atherosclerosis.She was given IV lasix and admitted.  ? ? ?Past Medical History:  ?Diagnosis Date  ? Bilateral carpal tunnel syndrome 07/27/2014  ? Bipolar disorder ([facility]   ? [facility]  ? Chronic diastolic CHF (congestive heart failure) ([facility]   ? a. 03/2017 Echo: EF 55-60%, no rwma, Gr1 DD, mild MR; b. 06/2019 Echo: EF 55-60%, no rwma, mod LVH, GrII DD. Nl RV size/fxn. RVSP 54.640mg. Mildly dil LA.  ? Chronic low back pain 07/29/2014  ? Degenerative arthritis of lumbar spine 07/27/2014  ? Dementia ([place]  ? Depression   ? Esophageal spasm 07/29/2014  ? Gastric reflux 09/17/2014  ? GERD (gastroesophageal reflux disease)   ? History of stress test   ? a. 04/2017 MV: low risk stress test.  EF 55-65%. Probable apical ant/apical, basal, and mid inflat attenuation artifact vs ischemia/scar.  ? Hyperlipemia 07/29/2014  ? Hypertension   ? Pneumonia   ? Spinal stenosis 07/29/2014  ? ? ?Past Surgical History:  ?Procedure Laterality Date  ? AMPUTATION TOE Right 06/07/2021  ? Procedure: AMPUTATION TOE;  Surgeon: EvEdrick KinsDPM;  Location: [facility] ORS;  Service: Podiatry;  Laterality: Right;  ? HIP SURGERY Right   ? INNER EAR SURGERY Right   ? LOWER EXTREMITY ANGIOGRAPHY N/A 06/06/2021  ?  Procedure: Lower Extremity Angiography;  Surgeon: Algernon Huxley, MD;  Location: [facility];  Service: Cardiovascular;  Laterality: N/A;  ? REPLACEMENT TOTAL KNEE BILATERAL Bilateral 07/27/14  ?  ? ?Home Medications:  ?Prior to Admission medications   ?Medication Sig Start Date End Date Taking? Authorizing Provider  ?acetaminophen (TYLENOL) 325 MG tablet Take 2 tablets (650 mg total) by mouth every 6 (six) hours as needed for mild pain.  06/13/21  Yes Bonnielee Haff, MD  ?albuterol (VENTOLIN HFA) 108 (90 Base) MCG/ACT inhaler Inhale 2 puffs into the lungs every 6 (six) hours as needed for wheezing or shortness of breath. 04/14/21  Yes Kayleen Memos, DO  ?apixaban (ELIQUIS) 5 MG TABS tablet Take 69m (2 tablets) twice daily for 4 days, then start 538mtwice daily. ?Patient taking differently: Take 5 mg by mouth 2 (two) times daily. 06/14/21  Yes KrBonnielee HaffMD  ?ascorbic acid (VITAMIN C) 500 MG tablet Take 1 tablet (500 mg total) by mouth 2 (two) times daily. 06/13/21  Yes KrBonnielee HaffMD  ?benzonatate (TESSALON PERLES) 100 MG capsule Take 1 capsule (100 mg total) by mouth every 6 (six) hours as needed for cough. 04/14/21 04/14/22 Yes HaKayleen MemosDO  ?cholecalciferol (VITAMIN D) 1000 units tablet Take 1,000 Units by mouth daily.   Yes [provider]  ?clopidogrel (PLAVIX) 75 MG tablet Take 1 tablet (75 mg total) by mouth daily. 06/14/21  Yes KrBonnielee HaffMD  ?divalproex (DEPAKOTE SPRINKLE) 125 MG capsule Take 125 mg by mouth 2 (two) times daily.   Yes [provider]  ?Lactobacillus Acid-Pectin (ACIDOPHILUS/PECTIN) CAPS Take 1 capsule by mouth daily.   Yes [provider]  ?magnesium hydroxide (MILK OF MAGNESIA) 400 MG/5ML suspension Take 30 mLs by mouth every 12 (twelve) hours as needed for mild constipation.   Yes [provider]  ?magnesium oxide (MAG-OX) 400 (240 Mg) MG tablet Take 1 tablet (400 mg total) by mouth 2 (two) times daily. 06/14/21  Yes KrBonnielee HaffMD  ?megestrol (MEGACE) 400 MG/10ML suspension Take 10 mLs (400 mg total) by mouth daily. 06/14/21  Yes KrBonnielee HaffMD  ?metoprolol succinate (TOPROL-XL) 25 MG 24 hr tablet Take 1 tablet (25 mg total) by mouth daily. 06/13/21  Yes KrBonnielee HaffMD  ?mirtazapine (REMERON) 7.5 MG tablet Take 1 tablet (7.5 mg total) by mouth at bedtime. 06/13/21  Yes KrBonnielee HaffMD  ?Multiple Vitamin (MULTIVITAMIN WITH MINERALS) TABS tablet Take 1  tablet by mouth daily. 06/14/21  Yes KrBonnielee HaffMD  ?Nutritional Supplements (,FEEDING SUPPLEMENT, PROSOURCE PLUS) liquid Take 30 mLs by mouth 3 (three) times daily between meals. 06/13/21  Yes KrBonnielee HaffMD  ?omeprazole (PRILOSEC) 20 MG capsule Take 20 mg by mouth 2 (two) times daily before a meal.    Yes [provider]  ?oxyCODONE (OXY IR/ROXICODONE) 5 MG immediate release tablet Take 1 tablet (5 mg total) by mouth every 6 (six) hours as needed for moderate pain. 06/13/21  Yes KrBonnielee HaffMD  ?polyethylene glycol (MIRALAX) 17 g packet Take 17 g by mouth daily as needed for mild constipation. ?Patient taking differently: Take 17 g by mouth daily. 04/14/21  Yes HaKayleen MemosDO  ?Probiotic Product (PROBIOTIC DAILY) CAPS Take 1 capsule by mouth daily.   Yes [provider]  ?QUEtiapine Fumarate (SEROQUEL XR) 150 MG 24 hr tablet Take 150 mg by mouth at bedtime.   Yes [provider]  ?senna-docusate (SENOKOT-S) 8.6-50 MG tablet Take 1 tablet  by mouth 2 (two) times daily. Stool softener ?Patient taking differently: Take 2 tablets by mouth 2 (two) times daily. Stool softener 12/17/20 12/17/21 Yes Pokhrel, Laxman, MD  ?sertraline (ZOLOFT) 50 MG tablet Take 25 mg by mouth daily.   Yes [provider]  ?simvastatin (ZOCOR) 40 MG tablet Take 40 mg by mouth at bedtime.    Yes [provider]  ?sucralfate (CARAFATE) 1 g tablet Take 1 tablet (1 g total) by mouth 4 (four) times daily -  with meals and at bedtime. 06/13/21  Yes Bonnielee Haff, MD  ?vitamin B-12 (CYANOCOBALAMIN) 500 MCG tablet Take 1 tablet (500 mcg total) by mouth daily. 12/17/20 12/17/21 Yes Pokhrel, Laxman, MD  ?zinc sulfate 220 (50 Zn) MG capsule Take 1 capsule (220 mg total) by mouth daily. ?Patient not taking: Reported on 07/11/2021 06/14/21   Bonnielee Haff, MD  ? ? ?Inpatient Medications: ?Scheduled Meds: ? acidophilus  1 capsule Oral Daily  ? apixaban  5 mg Oral BID  ? clopidogrel  75 mg Oral Daily   ? furosemide  40 mg Intravenous Daily  ? mirtazapine  7.5 mg Oral QHS  ? pantoprazole  40 mg Oral Daily  ? senna-docusate  1 tablet Oral BID  ? simvastatin  40 mg Oral QHS  ? sodium chloride flush  3 mL Cecille Aver

## 2021-07-13 ENCOUNTER — Inpatient Hospital Stay: Payer: Medicare Other

## 2021-07-13 DIAGNOSIS — R109 Unspecified abdominal pain: Secondary | ICD-10-CM | POA: Diagnosis present

## 2021-07-13 DIAGNOSIS — I5033 Acute on chronic diastolic (congestive) heart failure: Secondary | ICD-10-CM | POA: Diagnosis not present

## 2021-07-13 DIAGNOSIS — J90 Pleural effusion, not elsewhere classified: Secondary | ICD-10-CM

## 2021-07-13 DIAGNOSIS — J9601 Acute respiratory failure with hypoxia: Secondary | ICD-10-CM | POA: Diagnosis not present

## 2021-07-13 LAB — COMPREHENSIVE METABOLIC PANEL
ALT: 10 U/L (ref 0–44)
AST: 19 U/L (ref 15–41)
Albumin: 3 g/dL — ABNORMAL LOW (ref 3.5–5.0)
Alkaline Phosphatase: 38 U/L (ref 38–126)
Anion gap: 5 (ref 5–15)
BUN: 17 mg/dL (ref 8–23)
CO2: 38 mmol/L — ABNORMAL HIGH (ref 22–32)
Calcium: 8 mg/dL — ABNORMAL LOW (ref 8.9–10.3)
Chloride: 94 mmol/L — ABNORMAL LOW (ref 98–111)
Creatinine, Ser: 0.65 mg/dL (ref 0.44–1.00)
GFR, Estimated: >60 mL/min (ref >60)
Glucose, Bld: 83 mg/dL (ref 70–99)
Potassium: 3.5 mmol/L (ref 3.5–5.1)
Sodium: 137 mmol/L (ref 135–145)
Total Bilirubin: 0.7 mg/dL (ref 0.3–1.2)
Total Protein: 5.8 g/dL — ABNORMAL LOW (ref 6.5–8.1)

## 2021-07-13 LAB — MAGNESIUM: Magnesium: 1.8 mg/dL (ref 1.7–2.4)

## 2021-07-13 LAB — PHOSPHORUS: Phosphorus: 4.2 mg/dL (ref 2.5–4.6)

## 2021-07-13 LAB — IRON AND TIBC
Iron: 59 ug/dL (ref 28–170)
Saturation Ratios: 27 % (ref 10.4–31.8)
TIBC: 223 ug/dL — ABNORMAL LOW (ref 250–450)
UIBC: 164 ug/dL

## 2021-07-13 LAB — PROTEIN, PLEURAL OR PERITONEAL FLUID: Total protein, fluid: <3 g/dL

## 2021-07-13 LAB — CBC
HCT: 26.4 % — ABNORMAL LOW (ref 36.0–46.0)
Hemoglobin: 8.2 g/dL — ABNORMAL LOW (ref 12.0–15.0)
MCH: 36.8 pg — ABNORMAL HIGH (ref 26.0–34.0)
MCHC: 31.1 g/dL (ref 30.0–36.0)
MCV: 118.4 fL — ABNORMAL HIGH (ref 80.0–100.0)
Platelets: 153 10*3/uL (ref 150–400)
RBC: 2.23 MIL/uL — ABNORMAL LOW (ref 3.87–5.11)
RDW: 22.1 % — ABNORMAL HIGH (ref 11.5–15.5)
WBC: 3.2 10*3/uL — ABNORMAL LOW (ref 4.0–10.5)
nRBC: 0 % (ref 0.0–0.2)

## 2021-07-13 LAB — BODY FLUID CELL COUNT WITH DIFFERENTIAL
Eos, Fluid: 0 %
Lymphs, Fluid: 64 %
Monocyte-Macrophage-Serous Fluid: 31 %
Neutrophil Count, Fluid: 5 %
Total Nucleated Cell Count, Fluid: 55 cu mm

## 2021-07-13 LAB — RETICULOCYTES
Immature Retic Fract: 16.2 % — ABNORMAL HIGH (ref 2.3–15.9)
RBC.: 2.27 MIL/uL — ABNORMAL LOW (ref 3.87–5.11)
Retic Count, Absolute: 60.8 10*3/uL (ref 19.0–186.0)
Retic Ct Pct: 2.7 % (ref 0.4–3.1)

## 2021-07-13 LAB — LACTATE DEHYDROGENASE, PLEURAL OR PERITONEAL FLUID: LD, Fluid: 65 U/L — ABNORMAL HIGH (ref 3–23)

## 2021-07-13 LAB — FOLATE: Folate: 13.4 ng/mL (ref >5.9)

## 2021-07-13 LAB — FERRITIN: Ferritin: 263 ng/mL (ref 11–307)

## 2021-07-13 LAB — GLUCOSE, PLEURAL OR PERITONEAL FLUID: Glucose, Fluid: 115 mg/dL

## 2021-07-13 LAB — VITAMIN B12: Vitamin B-12: 1958 pg/mL — ABNORMAL HIGH (ref 180–914)

## 2021-07-13 MED ORDER — LIDOCAINE VISCOUS HCL 2 % MT SOLN
15.0000 mL | Freq: Once | OROMUCOSAL | Status: AC
Start: 2021-07-13 — End: 2021-07-13
  Administered 2021-07-13: 15 mL via ORAL
  Filled 2021-07-13 (×2): qty 15

## 2021-07-13 MED ORDER — PHENOL 1.4 % MT LIQD
1.0000 | OROMUCOSAL | Status: DC | PRN
  Filled 2021-07-13: qty 177

## 2021-07-13 MED ORDER — ALUM & MAG HYDROXIDE-SIMETH 200-200-20 MG/5ML PO SUSP
30.0000 mL | Freq: Once | ORAL | Status: AC
Start: 2021-07-13 — End: 2021-07-13
  Administered 2021-07-13: 30 mL via ORAL
  Filled 2021-07-13: qty 30

## 2021-07-13 MED ORDER — MORPHINE SULFATE (PF) 2 MG/ML IV SOLN
1.0000 mg | Freq: Once | INTRAVENOUS | Status: AC
Start: 2021-07-13 — End: 2021-07-13
  Administered 2021-07-13: 1 mg via INTRAVENOUS
  Filled 2021-07-13: qty 1

## 2021-07-13 NOTE — Progress Notes (Signed)
? ? ?Progress Note ? ?Patient Name: Jenna Dudley ?Date of Encounter: 07/13/2021 ? ?Primary Cardiologist: Kirke Corin ? ?Subjective  ? ?Breathing a little better. No chest pain. Continues to note abdominal pain. Documented UOP 1.8 L for the past 24 hours, net - 1.5 L for the admission. Renal function stable.  ? ?Inpatient Medications  ?  ?Scheduled Meds: ? (feeding supplement) PROSource Plus  30 mL Oral TID BM  ? acidophilus  1 capsule Oral Daily  ? apixaban  5 mg Oral BID  ? clopidogrel  75 mg Oral Daily  ? furosemide  40 mg Intravenous Daily  ? mirtazapine  7.5 mg Oral QHS  ? multivitamin with minerals  1 tablet Oral Daily  ? pantoprazole  40 mg Oral Daily  ? senna-docusate  1 tablet Oral BID  ? simvastatin  40 mg Oral QHS  ? sodium chloride flush  3 mL Intravenous Q12H  ? vitamin B-12  500 mcg Oral Daily  ? ?Continuous Infusions: ? sodium chloride    ? azithromycin 500 mg (07/12/21 1814)  ? cefTRIAXone (ROCEPHIN)  IV 2 g (07/12/21 1701)  ? ?PRN Meds: ?sodium chloride, acetaminophen, albuterol, benzonatate, morphine injection, ondansetron (ZOFRAN) IV, sodium chloride flush  ? ?Vital Signs  ?  ?Vitals:  ? 07/13/21 0010 07/13/21 6967 07/13/21 0327 07/13/21 0726  ?BP: 132/63 111/73  (!) 115/56  ?Pulse: 71 70  (!) 110  ?Resp: 16 18  17   ?Temp: 98.1 ?F (36.7 ?C) 97.7 ?F (36.5 ?C)  97.9 ?F (36.6 ?C)  ?TempSrc:  Oral    ?SpO2: 100% 100%  100%  ?Weight:   47.2 kg   ?Height:      ? ? ?Intake/Output Summary (Last 24 hours) at 07/13/2021 0815 ?Last data filed at 07/13/2021 0325 ?Gross per 24 hour  ?Intake 240 ml  ?Output 2045 ml  ?Net -1805 ml  ? ?Filed Weights  ? 07/11/21 1400 07/11/21 2035 07/13/21 0327  ?Weight: 53.9 kg 50.7 kg 47.2 kg  ? ? ?Telemetry  ?  ?SR with PACs and PVCs - Personally Reviewed ? ?ECG  ?  ?No new tracings - Personally Reviewed ? ?Physical Exam  ? ?GEN: No acute distress; frail appearing.  ?Neck: JVD difficult to assess. ?Cardiac: Irregular, II/VI systolic murmurs, no rubs, or gallops.  ?Respiratory:  Diminished at the bases bilaterally.  ?GI: Soft, diffuse mild tenderness to palpation, non-distended.   ?MS: No edema; No deformity. ?Neuro:  Alert; Nonfocal.  ?Psych: Normal affect. ? ?Labs  ?  ?Chemistry ?Recent Labs  ?Lab 07/11/21 ?1433 07/12/21 ?0631 07/13/21 ?07/15/21  ?NA 136 138 137  ?K 4.1 4.0 3.5  ?CL 95* 95* 94*  ?CO2 33* 36* 38*  ?GLUCOSE 117* 89 83  ?BUN 20 18 17   ?CREATININE 0.66 0.61 0.65  ?CALCIUM 8.4* 8.2* 8.0*  ?PROT 6.2* 6.4* 5.8*  ?ALBUMIN 3.3* 3.4* 3.0*  ?AST 22 19 19   ?ALT 13 12 10   ?ALKPHOS 45 41 38  ?BILITOT 0.4 0.6 0.7  ?GFRNONAA >60 >60 >60  ?ANIONGAP 8 7 5   ?  ? ?Hematology ?Recent Labs  ?Lab 07/11/21 ?1433 07/12/21 ?0631 07/13/21 ?  ?WBC 4.3 4.1 3.2*  ?RBC 2.18* 2.33* 2.23*  ?HGB 7.9* 8.5* 8.2*  ?HCT 25.7* 27.2* 26.4*  ?MCV 117.9* 116.7* 118.4*  ?MCH 36.2* 36.5* 36.8*  ?MCHC 30.7 31.3 31.1  ?RDW 22.3* 22.5* 22.1*  ?PLT 133* 167 153  ? ? ?Cardiac EnzymesNo results for input(s): TROPONINI in the last 168 hours. No results for input(s): TROPIPOC in  the last 168 hours.  ? ?BNP ?Recent Labs  ?Lab 07/11/21 ?1433 07/11/21 ?1940  ?BNP 1,471.7* 1,334.9*  ?  ? ?DDimer No results for input(s): DDIMER in the last 168 hours.  ? ?Radiology  ?  ?CT ABDOMEN PELVIS W CONTRAST ? ?Result Date: 07/11/2021 ?IMPRESSION: 1. Evidence of fluid overload with moderate right and small to moderate left open and pleural effusions, small volume ascites and mild anasarca. 2. Mild cardiomegaly. Coronary atherosclerosis. 3. No evidence of bowel obstruction or acute bowel inflammation. 4. Small hiatal hernia. 5. Aortic Atherosclerosis (ICD10-I70.0). Electronically Signed   By: Delbert Phenix M.D.   On: 07/11/2021 16:39  ? ?DG Chest Port 1 View ? ?Result Date: 07/12/2021 ?IMPRESSION: 1. No change in the appearance of the chest with bilateral effusions RIGHT greater than LEFT and basilar airspace disease. Electronically Signed   By: Donzetta Kohut M.D.   On: 07/12/2021 09:05  ? ?DG Chest Portable 1 View ? ?Result Date:  07/11/2021 ?IMPRESSION: Increased density seen in the right lung may suggest layering of pleural effusion and underlying atelectasis/pneumonia. There is new patchy infiltrate in the left lower lung fields suggesting subsegmental atelectasis/pneumonia. Small to moderate bilateral pleural effusions, more so on the right side. Electronically Signed   By: Ernie Avena M.D.   On: 07/11/2021 16:36   ? ?Cardiac Studies  ? ?2D echo 06/03/2021: ?1. Left ventricular ejection fraction, by estimation, is 55 to 60%. The  ?left ventricle has normal function. The left ventricle has no regional  ?wall motion abnormalities. There is moderate left ventricular hypertrophy.  ?Left ventricular diastolic  ?parameters are indeterminate.  ? 2. Right ventricular systolic function is normal. The right ventricular  ?size is normal. There is severely elevated pulmonary artery systolic  ?pressure. The estimated right ventricular systolic pressure is 85.3 mmHg.  ? 3. Left atrial size was mildly dilated.  ? 4. The mitral valve is normal in structure. Moderate to severe mitral  ?valve regurgitation. No evidence of mitral stenosis.  ? 5. Tricuspid valve regurgitation is moderate to severe.  ? 6. The aortic valve was not well visualized. Aortic valve regurgitation  ?is not visualized. No aortic stenosis is present.  ? 7. The inferior vena cava is normal in size with <50% respiratory  ?variability, suggesting right atrial pressure of 8 mmHg. ? ?Patient Profile  ?   ?86 y.o. female with history of HFpEF, pulmonary hypertension, CVA, PAD s/p tow amputation, recent PE, bipolar disorder, HTN, and HLD who we are seeing for acute on chronic HFpEF. ? ?Assessment & Plan  ?  ?1. HFpEF/pulmonary hypertension/bilateral pleural effusions: ?-Continue IV Lasix 40 mg bid ?-Daily weights ?-Strict I/O ?-Thoracentesis pending ?-Likely exacerbated by third spacing as well with anemia and hypoalbuminemia  ? ?2. Elevated high sensitivity troponin: ?-Mildly elevated  and down trending, not consistent with ACS, likely supply demand ischemia in the setting of volume overload ?-PTA Eliquis and Plavix as outlined above ? ?3. HTN: ?-Blood pressure well controlled ?-Continue current medical therapy  ? ?4. HLD: ?-Recommend resumption of statin if no contraindicated  ? ?5. Recent PE: ?-PTA Eliquis per IM ? ?6. Anemia: ?-Felt to be of chronic disease ?-Stable ? ?7. PAD s/p right tow amputation: ?-Plavix per IM/vascular surgery  ?-Recommend resuming PTA statin if not contraindicated  ? ? ?   ? ?For questions or updates, please contact [facility] ?Please consult [email or web address] for contact info under Cardiology/STEMI. ?  ? ?Signed, ?Eula Listen, PA-C ?[facility] ?Pager: [phone] ?07/13/2021, 8:15  AM ? ?

## 2021-07-13 NOTE — Assessment & Plan Note (Signed)
Estimated body mass index is 20.32 kg/m? as calculated from the following: ?  Height as of this encounter: 5' (1.524 m). ?  Weight as of this encounter: 47.2 kg.  ? ?-Dietitian consult ?

## 2021-07-13 NOTE — Progress Notes (Signed)
?Progress Note ? ? ?Patient: Jenna Dudley FIE:[id number] DOB: 10-29-1935 DOA: 07/11/2021     2 ?DOS: the patient was seen and examined on 07/13/2021 ?  ?Brief hospital course: ?Taken from prior notes. ? ?86 years old female with PMH significant for chronic diastolic CHF with recent echo shows LVEF 55 to 60%, dementia, depression, GERD, hyperlipidemia, hypertension, history of PE on Eliquis was sent from SNF with concern about UTI.  Patient has developed lower abdominal pain associated with shortness of breath, SNF staff thought patient might have  UTI. UA unremarkable but patient has developed worsening abdominal pain.  Work-up in the ED showed BNP 1417, CT abdomen and pelvis shows fluid overload,  moderate right, small to moderate left pleural effusion. ?Patient is admitted for acute on chronic hypoxic respiratory failure secondary to CHF exacerbation.  Cardiology is consulted.  IR consulted for possible thoracocentesis. ?Echocardiogram with severe pulmonary hypertension. ? ?4/19: Patient continued to have abdominal pain, no nausea or vomiting.  Apparently did had a bowel movement yesterday.  KUB with gaseous distention most likely colonic, concerning for adynamic ileus.  Will obtain upright films to rule out free air. ?Going for thoracentesis today. ?Saturating 100% on 2 L of oxygen-which is her baseline. ?We will continue with IV diuresis as renal function remains stable. ?Patient is also on Eliquis and Plavix both, message sent to cardiology if we can discontinue Plavix to decrease the risk of bleeding. ? ? ?Assessment and Plan: ?* Acute respiratory failure with hypoxia (HCC) ?She presented with abdominal pain, shortness of breath, hypoxia.Marland Kitchen ?She was hypoxic requiring 3 L of oxygen slightly higher from her baseline of 2 L. ?BNP 1417, bibasilar crackles on exam, edema+ ?Likely acute diastolic CHF exacerbation. Other differential diagnosis include pneumonia. ?CT Abdomen showed findings consistent with atelectasis  versus pneumonia. ?She was started on ceftriaxone and Zithromax. ?Procalcitonin negative, remained afebrile, no leukocytosis. ?Echocardiogram with severe pulmonary hypertension. ?-Discontinue antibiotics ?-Continue with IV diuresis ?-Going for thoracentesis today ?-Now at baseline oxygen requirement of 2 L ? ?Pleural effusion on right ?CTA/P: shows pleural effusion on both sides, large on right.. ?-Continue Lasix 40 mg IV daily. ?IR consulted for possible thoracocentesis. ? ?CHF exacerbation (HCC) ?Patient shows symptoms consistent with CHF exacerbation.   ?Elevated BNP, hypoxia, Pedal edema, bibasilar crackles. ?Echocardiogram last month showed LVEF 55 to 60%. ?-Continue Lasix 40 mg IV daily. ?-Daily weight and BMP ?-Strict intake and output ? ?Thrombocytopenia (HCC) ?She does have chronic thrombocytopenia. ?Her platelet count is at baseline. ? ?Pulmonary emboli (HCC) ?-Continue Eliquis. ? ?Anemia of chronic disease ?There is no obvious bleeding noted.  Anemia panel with anemia of chronic disease, folate normal, B12 pending.  Significant macrocytosis. ?-Monitor H&H.  ?- Transfusion threshold 7.0 ? ?Abdominal pain ?Patient has nonspecific abdominal pain, mild diffuse tenderness more prominent in epigastrium and left lower quadrant.  Per nursing chart did had a bowel movement yesterday.  KUB with dilated bowel, concerning for gaseous distention, mostly colonic.  CT abdomen done yesterday was without any significant abnormality. ?-Try GI cocktail ?-We will obtain upright films to rule out any free air. ?-Bowel regimen ? ?Delirium with dementia ?Patient does have dementia with intermittent behavioral problems. ?-Delirium precautions ? ? ?Protein-calorie malnutrition, severe ?Estimated body mass index is 20.32 kg/m? as calculated from the following: ?  Height as of this encounter: 5' (1.524 m). ?  Weight as of this encounter: 47.2 kg.  ? ?-Dietitian consult ? ?Depression ?Continue Remeron. ? ?  ? ?Subjective: Patient  was seen  and examined today.  She was complaining of abdominal pain, no nausea, vomiting or diarrhea.  Appears to be oriented to name only. ? ?Physical Exam: ?Vitals:  ? 07/13/21 1129 07/13/21 1353 07/13/21 1419 07/13/21 1459  ?BP: (!) 112/59 (!) 126/53 97/85 101/73  ?Pulse: 77   72  ?Resp: 17     ?Temp: 97.8 ?F (36.6 ?C)   97.9 ?F (36.6 ?C)  ?TempSrc:    Oral  ?SpO2: 99% 96% 96% 96%  ?Weight:      ?Height:      ? ?General.frail and malnourished lady, in no acute distress. ?Pulmonary.  Lungs clear bilaterally, normal respiratory effort.  Decreased breath sound at bases ?CV.  Regular rate and rhythm, systolic murmur ?Abdomen.  Soft, nontender, nondistended, BS positive. ?CNS.  Alert and oriented to name only.  No focal neurologic deficit. ?Extremities.  No edema, no cyanosis, pulses intact and symmetrical. ?Psychiatry.  Judgment and insight appears impaired. ? ?Data Reviewed: ?Prior notes, labs and images reviewed ? ?Family Communication:  ? ?Disposition: ?Status is: Inpatient ?Remains inpatient appropriate because: Severity of illness ? ? Planned Discharge Destination: Skilled nursing facility ? ?DVT prophylaxis.  Eliquis ? ?Time spent: 50 minutes ? ?This record has been created using Conservation officer, historic buildings. Errors have been sought and corrected,but may not always be located. Such creation errors do not reflect on the standard of care. ? ?Author: ?Arnetha Courser, MD ?07/13/2021 3:43 PM ? ?For on call review [email or web address].  ?

## 2021-07-13 NOTE — Progress Notes (Signed)
Physical Therapy Treatment ?Patient Details ?Name: Jenna Dudley ?MRN: [id number] ?DOB: 1935/10/26 ?Today's Date: 07/13/2021 ? ? ?History of Present Illness 86 y.o. female presenting to hospital 4/17 with c/o abdominal pain with SOB.  H/o R 3rd toe amp 06/07/21.  Pt admitted with acute respiratory failure with hypoxia, R pleural effusion, and CHF exacerbation.  PMH includes CVA, dementia, h/o PE on Eliquis, hip sx, inner ear sx, B TKR 2016, reflux, chronic pain, spinal stenosis, CHF, dementia, and bipolar disorder. ? ?  ?PT Comments  ? ? Attempted to see pt multiple times today, confused and anxious each effort.  Ultimately PT could not get her to try doing mobility or walking as she was perseverating on her husband coming back.  Did get some inconsistent PROM/AAROM reps for b/l LE exercises but these were inconsistent and limited.  Pt did not do nearly as much today with PT as on prior session, per her baseline mobility and equipments it is difficult to see how husband will be able to manage her at home.  ?Recommendations for follow up therapy are one component of a multi-disciplinary discharge planning process, led by the attending physician.  Recommendations may be updated based on patient status, additional functional criteria and insurance authorization. ? ?Follow Up Recommendations ?  (per prior PT session HHPT, per today home may be difficult/unsafe, furhter recs per progress) ?  ?  ?Assistance Recommended at Discharge Frequent or constant Supervision/Assistance  ?Patient can return home with the following Assistance with cooking/housework;Assist for transportation;Help with stairs or ramp for entrance;A lot of help with walking and/or transfers;A lot of help with bathing/dressing/bathroom ?  ?Equipment Recommendations ? Rolling walker (2 wheels)  ?  ?Recommendations for Other Services   ? ? ?  ?Precautions / Restrictions Precautions ?Precautions: Fall ?Restrictions ?Weight Bearing Restrictions: No  ?   ? ?Mobility ? Bed Mobility ?  ?  ?  ?  ?  ?  ?  ?General bed mobility comments: attempted with verbal and tactile cues to encourage mobility, occasionally seemed to initiate movement but ultimately to no avail ?  ? ?Transfers ?  ?  ?  ?  ?  ?  ?  ?  ?  ?  ?  ? ?Ambulation/Gait ?  ?  ?  ?  ?  ?  ?  ?  ? ? ?Stairs ?  ?  ?  ?  ?  ? ? ?Wheelchair Mobility ?  ? ?Modified Rankin (Stroke Patients Only) ?  ? ? ?  ?Balance   ?  ?  ?  ?  ?  ?  ?  ?  ?  ?  ?  ?  ?  ?  ?  ?  ?  ?  ?  ? ?  ?Cognition   ?Behavior During Therapy: Anxious, Restless ?Overall Cognitive Status: History of cognitive impairments - at baseline ?  ?  ?  ?  ?  ?  ?  ?  ?  ?  ?  ?  ?  ?  ?  ?  ?  ?  ?  ? ?  ?Exercises General Exercises - Lower Extremity ?Ankle Circles/Pumps: PROM, 10 reps ?Heel Slides: PROM, 5 reps ?Hip ABduction/ADduction: PROM, 10 reps, AAROM ?Straight Leg Raises: 5 reps, PROM, AAROM ? ?  ?General Comments   ?  ?  ? ?Pertinent Vitals/Pain Pain Assessment ?Pain Assessment: No/denies pain  ? ? ?Home Living   ?  ?  ?  ?  ?  ?  ?  ?  ?  ?   ?  ?  Prior Function    ?  ?  ?   ? ?PT Goals (current goals can now be found in the care plan section) Progress towards PT goals: Not progressing toward goals - comment (too anxious and confused) ? ?  ?Frequency ? ? ? Min 2X/week ? ? ? ?  ?PT Plan Discharge plan needs to be updated  ? ? ?Co-evaluation   ?  ?  ?  ?  ? ?  ?AM-PAC PT "6 Clicks" Mobility   ?Outcome Measure ? Help needed turning from your back to your side while in a flat bed without using bedrails?: Total ?Help needed moving from lying on your back to sitting on the side of a flat bed without using bedrails?: Total ?Help needed moving to and from a bed to a chair (including a wheelchair)?: Total ?Help needed standing up from a chair using your arms (e.g., wheelchair or bedside chair)?: Total ?Help needed to walk in hospital room?: Total ?Help needed climbing 3-5 steps with a railing? : Total ?6 Click Score: 6 ? ?  ?End of Session Equipment  Utilized During Treatment: Oxygen ?Activity Tolerance: Other (comment) (anxious with all aspects of session) ?Patient left: with bed alarm set;with call bell/phone within reach ?Nurse Communication: Mobility status ?PT Visit Diagnosis: Other abnormalities of gait and mobility (R26.89);Muscle weakness (generalized) (M62.81) ?  ? ? ?Time: 1547-1600 ?PT Time Calculation (min) (ACUTE ONLY): 13 min ? ?Charges:  $Therapeutic Exercise: 8-22 mins          ?          ? ?Malachi Pro, DPT ?07/13/2021, 4:53 PM ? ?

## 2021-07-13 NOTE — Assessment & Plan Note (Signed)
Patient has nonspecific abdominal pain, mild diffuse tenderness more prominent in epigastrium and left lower quadrant.  Per nursing chart did had a bowel movement yesterday.  KUB with dilated bowel, concerning for gaseous distention, mostly colonic.  CT abdomen done yesterday was without any significant abnormality. ?-Try GI cocktail ?-We will obtain upright films to rule out any free air. ?-Bowel regimen ?

## 2021-07-13 NOTE — Progress Notes (Signed)
OT Cancellation Note ? ?Patient Details ?Name: Jenna Dudley ?MRN: [id number] ?DOB: Oct 11, 1935 ? ? ?Cancelled Treatment:    Reason Eval/Treat Not Completed: Patient at procedure or test/ unavailable. Pt currently off the floor for [place] and unavailable for OT tx at this time. OT to re-attempt at later time/date as able. ? ?Fredirick Maudlin, OTR/L ?[place] ? ?

## 2021-07-13 NOTE — Hospital Course (Addendum)
Taken from prior notes. ? ?86 years old female with PMH significant for chronic diastolic CHF with recent echo shows LVEF 55 to 60%, dementia, depression, GERD, hyperlipidemia, hypertension, history of PE on Eliquis was sent from SNF with concern about UTI.  Patient has developed lower abdominal pain associated with shortness of breath, SNF staff thought patient might have  UTI. UA unremarkable but patient has developed worsening abdominal pain.  Work-up in the ED showed BNP 1417, CT abdomen and pelvis shows fluid overload,  moderate right, small to moderate left pleural effusion. ?Patient is admitted for acute on chronic hypoxic respiratory failure secondary to CHF exacerbation.  Cardiology is consulted.  IR consulted for possible thoracocentesis. ?Echocardiogram with severe pulmonary hypertension. ? ?4/19: Patient continued to have abdominal pain, no nausea or vomiting.  Apparently did had a bowel movement yesterday.  KUB with gaseous distention most likely colonic, concerning for adynamic ileus.  Will obtain upright films to rule out free air. ?Going for thoracentesis today. ?Saturating 100% on 2 L of oxygen-which is her baseline. ?We will continue with IV diuresis as renal function remains stable. ?Patient is also on Eliquis and Plavix both, message sent to cardiology if we can discontinue Plavix to decrease the risk of bleeding. ?-Cardiology recommended discontinuing Plavix. ? ?4/20: Thoracentesis was done with removal of 1.2 L of fluid, preliminary labs transudative and cultures remain negative. ?P.o. intake remains very poor.  Apparently case was discussed with husband during prior admission and he was on decisive about any enteral feeding. ?Dietitian is recommending enteral feeding. ?Palliative care was again consulted to discuss goals of care for failure to thrive and poor p.o. intake. ?Currently patient is full code. ? ?Husband wants to take her back home with home health services, he can provide 24/7  care. ?Maximum home health services ordered. ? ?Patient remained stable.  Clinically appears euvolemic.  Cardiology increase the home dose of Lasix from 20 mg to 40 mg daily.  Patient has quite severe pulmonary hypertension and will also need fluid restriction to keep her lungs dry and avoid recurrent pulmonary edema and pleural effusions. ? ?Patient is being discharged on current medication and will need to have a close follow-up with her providers. ? ?Patient with poor prognosis based on her age and underlying comorbidities with severe pulmonary hypertension.  Currently will remain full code and needs a goals of care discussion with palliative care.  Outpatient palliative care referral was provided. ? ?

## 2021-07-13 NOTE — Procedures (Signed)
PROCEDURE SUMMARY: ? ?Successful US guided right thoracentesis. ?Yielded 1.2 L of clear yellow fluid. ?Pt tolerated procedure well. ?No immediate complications. ? ?Specimen was sent for labs. ?CXR ordered. ? ?EBL < 1 mL ? ?Pattricia Boss D PA-C ?07/13/2021 ?2:20 PM ? ? ? ?

## 2021-07-14 DIAGNOSIS — J9601 Acute respiratory failure with hypoxia: Secondary | ICD-10-CM | POA: Diagnosis not present

## 2021-07-14 DIAGNOSIS — D638 Anemia in other chronic diseases classified elsewhere: Secondary | ICD-10-CM

## 2021-07-14 DIAGNOSIS — Z9889 Other specified postprocedural states: Secondary | ICD-10-CM

## 2021-07-14 DIAGNOSIS — R109 Unspecified abdominal pain: Secondary | ICD-10-CM

## 2021-07-14 DIAGNOSIS — R601 Generalized edema: Secondary | ICD-10-CM

## 2021-07-14 DIAGNOSIS — D509 Iron deficiency anemia, unspecified: Secondary | ICD-10-CM

## 2021-07-14 DIAGNOSIS — I2609 Other pulmonary embolism with acute cor pulmonale: Secondary | ICD-10-CM

## 2021-07-14 DIAGNOSIS — R410 Disorientation, unspecified: Secondary | ICD-10-CM

## 2021-07-14 LAB — BASIC METABOLIC PANEL
Anion gap: 2 — ABNORMAL LOW (ref 5–15)
BUN: 19 mg/dL (ref 8–23)
CO2: 40 mmol/L — ABNORMAL HIGH (ref 22–32)
Calcium: 8.1 mg/dL — ABNORMAL LOW (ref 8.9–10.3)
Chloride: 97 mmol/L — ABNORMAL LOW (ref 98–111)
Creatinine, Ser: 0.69 mg/dL (ref 0.44–1.00)
GFR, Estimated: >60 mL/min (ref >60)
Glucose, Bld: 93 mg/dL (ref 70–99)
Potassium: 3.4 mmol/L — ABNORMAL LOW (ref 3.5–5.1)
Sodium: 139 mmol/L (ref 135–145)

## 2021-07-14 LAB — PROTEIN, BODY FLUID (OTHER): Total Protein, Body Fluid Other: 1.9 g/dL

## 2021-07-14 MED ORDER — POTASSIUM CHLORIDE CRYS ER 20 MEQ PO TBCR
40.0000 meq | EXTENDED_RELEASE_TABLET | Freq: Once | ORAL | Status: AC
Start: 2021-07-14 — End: 2021-07-14
  Administered 2021-07-14: 40 meq via ORAL
  Filled 2021-07-14: qty 2

## 2021-07-14 MED ORDER — ALUM & MAG HYDROXIDE-SIMETH 200-200-20 MG/5ML PO SUSP
30.0000 mL | ORAL | Status: DC | PRN
  Administered 2021-07-14 (×2): 30 mL via ORAL
  Filled 2021-07-14 (×2): qty 30

## 2021-07-14 NOTE — Assessment & Plan Note (Signed)
Estimated body mass index is 19.98 kg/m? as calculated from the following: ?  Height as of this encounter: 5' (1.524 m). ?  Weight as of this encounter: 46.4 kg.  ? ?-Dietitian consult-recommending enteral feeding. ?-Need to be discussed with husband. ?-Palliative care consult ?

## 2021-07-14 NOTE — Progress Notes (Addendum)
?Progress Note ? ? ?Patient: Jenna Dudley KVQ:[id number] DOB: Aug 13, 1935 DOA: 07/11/2021     3 ?DOS: the patient was seen and examined on 07/14/2021 ?  ?Brief hospital course: ?Taken from prior notes. ? ?86 years old female with PMH significant for chronic diastolic CHF with recent echo shows LVEF 55 to 60%, dementia, depression, GERD, hyperlipidemia, hypertension, history of PE on Eliquis was sent from SNF with concern about UTI.  Patient has developed lower abdominal pain associated with shortness of breath, SNF staff thought patient might have  UTI. UA unremarkable but patient has developed worsening abdominal pain.  Work-up in the ED showed BNP 1417, CT abdomen and pelvis shows fluid overload,  moderate right, small to moderate left pleural effusion. ?Patient is admitted for acute on chronic hypoxic respiratory failure secondary to CHF exacerbation.  Cardiology is consulted.  IR consulted for possible thoracocentesis. ?Echocardiogram with severe pulmonary hypertension. ? ?4/19: Patient continued to have abdominal pain, no nausea or vomiting.  Apparently did had a bowel movement yesterday.  KUB with gaseous distention most likely colonic, concerning for adynamic ileus.  Will obtain upright films to rule out free air. ?Going for thoracentesis today. ?Saturating 100% on 2 L of oxygen-which is her baseline. ?We will continue with IV diuresis as renal function remains stable. ?Patient is also on Eliquis and Plavix both, message sent to cardiology if we can discontinue Plavix to decrease the risk of bleeding. ?-Cardiology recommended discontinuing Plavix. ? ?4/20: Thoracentesis was done with removal of 1.2 L of fluid, preliminary labs transudative and cultures remain negative. ?P.o. intake remains very poor.  Apparently case was discussed with husband during prior admission and he was on decisive about any enteral feeding. ?Dietitian is recommending enteral feeding. ?Palliative care was again consulted to discuss goals  of care for failure to thrive and poor p.o. intake. ?Currently patient is full code. ? ?Husband wants to take her back home with home health services, he can provide 24/7 care. ?Maximum home health services ordered. ? ? ?Assessment and Plan: ?* Acute respiratory failure with hypoxia (HCC) ?She presented with abdominal pain, shortness of breath, hypoxia.Marland Kitchen ?She was hypoxic requiring 3 L of oxygen slightly higher from her baseline of 2 L. ?BNP 1417, bibasilar crackles on exam, edema+ ?Likely acute diastolic CHF exacerbation. Other differential diagnosis include pneumonia. ?CT Abdomen showed findings consistent with atelectasis versus pneumonia. ?She was started on ceftriaxone and Zithromax. ?Procalcitonin negative, remained afebrile, no leukocytosis. ?Echocardiogram with severe pulmonary hypertension. ?Antibiotics were discontinued ?-Continue with IV diuresis ?-Going for thoracentesis today ?-Now at baseline oxygen requirement of 2 L ? ?Pleural effusion on right ?CTA/P: shows pleural effusion on both sides, large on right.Marland Kitchen ?S/p right thoracentesis with removal of 1.2 L.  Resulted in improvement in breathing status.  Preliminary cultures negative, appears to be transudative ?-Continue Lasix 40 mg IV daily. ? ? ?CHF exacerbation (HCC) ?Patient shows symptoms consistent with CHF exacerbation.   ?Elevated BNP, hypoxia, Pedal edema, bibasilar crackles. ?Echocardiogram last month showed LVEF 55 to 60%. ?No clinically appears euvolemic, renal functions remained stable so cardiology would like to continue with IV diuresis. ?-Continue Lasix 40 mg IV daily. ?-Daily weight and BMP ?-Strict intake and output ? ?Thrombocytopenia (HCC) ?She does have chronic thrombocytopenia. ?Her platelet count is at baseline. ? ?Pulmonary emboli (HCC) ?-Continue Eliquis. ? ?Anemia of chronic disease ?There is no obvious bleeding noted.  Anemia panel with anemia of chronic disease, folate normal, B12 pending.  Significant macrocytosis. ?-Monitor  H&H.  ?- Transfusion  threshold 7.0 ? ?Abdominal pain ?Patient has nonspecific abdominal pain, mild diffuse tenderness more prominent in epigastrium and left lower quadrant.  Per nursing chart did had a bowel movement yesterday.  KUB with dilated bowel, concerning for gaseous distention, mostly colonic.  CT abdomen done yesterday was without any significant abnormality. ?-Try GI cocktail ?-We will obtain upright films to rule out any free air. ?-Bowel regimen ? ?Delirium with dementia ?Patient does have dementia with intermittent behavioral problems. ?-Delirium precautions ? ? ?Protein-calorie malnutrition, severe ?Estimated body mass index is 19.98 kg/m? as calculated from the following: ?  Height as of this encounter: 5' (1.524 m). ?  Weight as of this encounter: 46.4 kg.  ? ?-Dietitian consult-recommending enteral feeding. ?-Need to be discussed with husband. ?-Palliative care consult ? ?Depression ?Continue Remeron. ? ?  ? ?Subjective: When asked that how is she feeling, patient states that I am scared that someone is going to hurt me.  No other complaints.  Appetite remains poor ? ?Physical Exam: ?Vitals:  ? 07/14/21 0338 07/14/21 0342 07/14/21 0755 07/14/21 1107  ?BP: 130/66  (!) 112/54 (!) 126/53  ?Pulse: (!) 58  68 78  ?Resp: 18  17 17   ?Temp: 98.1 ?F (36.7 ?C)  98 ?F (36.7 ?C) 97.9 ?F (36.6 ?C)  ?TempSrc:      ?SpO2: 100%  100% 100%  ?Weight:  46.4 kg    ?Height:      ? ?General.  Frail and severely malnourished elderly lady, in no acute distress. ?Pulmonary.  Lungs clear bilaterally, normal respiratory effort. ?CV.  Regular rate and rhythm, no JVD, rub or murmur. ?Abdomen.  Soft, nontender, nondistended, BS positive. ?CNS.  Alert and oriented to self only.  No focal neurologic deficit. ?Extremities.  No edema, no cyanosis, pulses intact and symmetrical. ?Psychiatry.  Judgment and insight appears impaired ? ?Data Reviewed: ?Prior notes and labs reviewed. ? ?Family Communication: Discussed with husband on  phone. ? ?Disposition: ?Status is: Inpatient ?Remains inpatient appropriate because: Severity of illness ? ? Planned Discharge Destination: Home with Home Health ? ?DVT prophylaxis.  Eliquis ? ?Time spent: 45 minutes ? ?This record has been created using . Errors have been sought and corrected,but may not always be located. Such creation errors do not reflect on the standard of care. ? ?Author: ?Conservation officer, historic buildings, MD ?07/14/2021 3:05 PM ? ?For on call review [email or web address].  ?

## 2021-07-14 NOTE — Progress Notes (Signed)
? ? ?Progress Note ? ?Patient Name: Galen DaftJoan M Mctavish ?Date of Encounter: 07/14/2021 ? ?Primary Cardiologist: Kirke Corinrida ? ?Subjective  ? ?Breathing a little better. She underwent right-sided thoracentesis yesterday with 1.2 L of clear yellow fluid removed.  Cytology pending. Documented UOP ~ 500 mL for the past 24 hours, net - 2.0 L for the admission. Renal function stable. Abdominal discomfort improving. No chest pain or palpitations.  ? ?Inpatient Medications  ?  ?Scheduled Meds: ? (feeding supplement) PROSource Plus  30 mL Oral TID BM  ? acidophilus  1 capsule Oral Daily  ? apixaban  5 mg Oral BID  ? furosemide  40 mg Intravenous Daily  ? mirtazapine  7.5 mg Oral QHS  ? multivitamin with minerals  1 tablet Oral Daily  ? pantoprazole  40 mg Oral Daily  ? senna-docusate  1 tablet Oral BID  ? simvastatin  40 mg Oral QHS  ? sodium chloride flush  3 mL Intravenous Q12H  ? vitamin B-12  500 mcg Oral Daily  ? ?Continuous Infusions: ? sodium chloride    ? ?PRN Meds: ?sodium chloride, acetaminophen, albuterol, benzonatate, morphine injection, ondansetron (ZOFRAN) IV, phenol, sodium chloride flush  ? ?Vital Signs  ?  ?Vitals:  ? 07/13/21 1951 07/13/21 2252 07/14/21 [id number] 07/14/21 0342  ?BP: (!) 148/55 (!) 117/56 130/66   ?Pulse: 85 75 (!) 58   ?Resp: 18 18 18    ?Temp: 98.4 ?F (36.9 ?C) 97.7 ?F (36.5 ?C) 98.1 ?F (36.7 ?C)   ?TempSrc:      ?SpO2: 100% 100% 100%   ?Weight:    46.4 kg  ?Height:      ? ? ?Intake/Output Summary (Last 24 hours) at 07/14/2021 0735 ?Last data filed at 07/14/2021 0342 ?Gross per 24 hour  ?Intake 120 ml  ?Output 650 ml  ?Net -530 ml  ? ? ?Filed Weights  ? 07/11/21 2035 07/13/21 0327 07/14/21 0342  ?Weight: 50.7 kg 47.2 kg 46.4 kg  ? ? ?Telemetry  ?  ?SR with PACs and PVCs with 2 short atrial runs - Personally Reviewed ? ?ECG  ?  ?No new tracings - Personally Reviewed ? ?Physical Exam  ? ?GEN: No acute distress; frail appearing.  ?Neck: JVD difficult to assess. ?Cardiac: Irregular, II/VI systolic murmurs, no  rubs, or gallops.  ?Respiratory: Diminished at the bases bilaterally with improved aeration of the right base.  ?GI: Soft, diffuse mild tenderness to palpation, non-distended.   ?MS: No edema; No deformity. ?Neuro:  Alert; Nonfocal.  ?Psych: Normal affect. ? ?Labs  ?  ?Chemistry ?Recent Labs  ?Lab 07/11/21 ?1433 07/12/21 ?0631 07/13/21 ?[id number] 07/14/21 ?[id number]  ?NA 136 138 137 139  ?K 4.1 4.0 3.5 3.4*  ?CL 95* 95* 94* 97*  ?CO2 33* 36* 38* 40*  ?GLUCOSE 117* 89 83 93  ?BUN 20 18 17 19   ?CREATININE 0.66 0.61 0.65 0.69  ?CALCIUM 8.4* 8.2* 8.0* 8.1*  ?PROT 6.2* 6.4* 5.8*  --   ?ALBUMIN 3.3* 3.4* 3.0*  --   ?AST 22 19 19   --   ?ALT 13 12 10   --   ?ALKPHOS 45 41 38  --   ?BILITOT 0.4 0.6 0.7  --   ?GFRNONAA >60 >60 >60 >60  ?ANIONGAP 8 7 5  2*  ? ?  ? ?Hematology ?Recent Labs  ?Lab 07/11/21 ?1433 07/12/21 ?0631 07/13/21 ?[id number]  ?WBC 4.3 4.1 3.2*  ?RBC 2.18* 2.33* 2.23*  2.27*  ?HGB 7.9* 8.5* 8.2*  ?HCT 25.7* 27.2* 26.4*  ?MCV 117.9* 116.7* 118.4*  ?  MCH 36.2* 36.5* 36.8*  ?MCHC 30.7 31.3 31.1  ?RDW 22.3* 22.5* 22.1*  ?PLT 133* 167 153  ? ? ? ?Cardiac EnzymesNo results for input(s): TROPONINI in the last 168 hours. No results for input(s): TROPIPOC in the last 168 hours.  ? ?BNP ?Recent Labs  ?Lab 07/11/21 ?1433 07/11/21 ?1940  ?BNP 1,471.7* 1,334.9*  ? ?  ? ?DDimer No results for input(s): DDIMER in the last 168 hours.  ? ?Radiology  ?  ?CT ABDOMEN PELVIS W CONTRAST ? ?Result Date: 07/11/2021 ?IMPRESSION: 1. Evidence of fluid overload with moderate right and small to moderate left open and pleural effusions, small volume ascites and mild anasarca. 2. Mild cardiomegaly. Coronary atherosclerosis. 3. No evidence of bowel obstruction or acute bowel inflammation. 4. Small hiatal hernia. 5. Aortic Atherosclerosis (ICD10-I70.0). Electronically Signed   By: Delbert Phenix M.D.   On: 07/11/2021 16:39  ? ?DG Chest Port 1 View ? ?Result Date: 07/12/2021 ?IMPRESSION: 1. No change in the appearance of the chest with bilateral effusions RIGHT  greater than LEFT and basilar airspace disease. Electronically Signed   By: Donzetta Kohut M.D.   On: 07/12/2021 09:05  ? ?DG Chest Portable 1 View ? ?Result Date: 07/11/2021 ?IMPRESSION: Increased density seen in the right lung may suggest layering of pleural effusion and underlying atelectasis/pneumonia. There is new patchy infiltrate in the left lower lung fields suggesting subsegmental atelectasis/pneumonia. Small to moderate bilateral pleural effusions, more so on the right side. Electronically Signed   By: Ernie Avena M.D.   On: 07/11/2021 16:36   ? ?Cardiac Studies  ? ?2D echo 06/03/2021: ?1. Left ventricular ejection fraction, by estimation, is 55 to 60%. The  ?left ventricle has normal function. The left ventricle has no regional  ?wall motion abnormalities. There is moderate left ventricular hypertrophy.  ?Left ventricular diastolic  ?parameters are indeterminate.  ? 2. Right ventricular systolic function is normal. The right ventricular  ?size is normal. There is severely elevated pulmonary artery systolic  ?pressure. The estimated right ventricular systolic pressure is 85.3 mmHg.  ? 3. Left atrial size was mildly dilated.  ? 4. The mitral valve is normal in structure. Moderate to severe mitral  ?valve regurgitation. No evidence of mitral stenosis.  ? 5. Tricuspid valve regurgitation is moderate to severe.  ? 6. The aortic valve was not well visualized. Aortic valve regurgitation  ?is not visualized. No aortic stenosis is present.  ? 7. The inferior vena cava is normal in size with <50% respiratory  ?variability, suggesting right atrial pressure of 8 mmHg. ? ?Patient Profile  ?   ?86 y.o. female with history of HFpEF, pulmonary hypertension, CVA, PAD s/p tow amputation, recent PE, bipolar disorder, HTN, and HLD who we are seeing for acute on chronic HFpEF. ? ?Assessment & Plan  ?  ?1. HFpEF/pulmonary hypertension/bilateral pleural effusions: ?-Status post right-sided thoracentesis on 4/19 with 1.2 L  of clear yellow fluid removed, cytology pending ?-Continue IV Lasix 40 mg bid ?-Daily weights ?-Strict I/O ?-Likely exacerbated by third spacing as well with anemia and hypoalbuminemia  ? ?2. Elevated high sensitivity troponin: ?-Mildly elevated and down trending, not consistent with ACS, likely supply demand ischemia in the setting of volume overload ?-PTA Eliquis and Plavix as outlined above ?-No plans for inpatient ischemic evaluation at this time ? ?3. HTN: ?-Blood pressure well controlled ?-Continue current medical therapy  ? ?4. HLD: ?-PTA simvastatin  ? ?5. Recent PE: ?-PTA Eliquis per IM ? ?6. Anemia: ?-Felt to be of  chronic disease ?-Stable ? ?7. PAD s/p right tow amputation: ?-Plavix per IM/vascular surgery  ?-Statin as above ? ?   ? ?For questions or updates, please contact [facility] ?Please consult [email or web address] for contact info under Cardiology/STEMI. ?  ? ?Signed, ?Eula Listen, PA-C ?[facility] ?Pager: [phone] ?07/14/2021, 7:35 AM ? ?

## 2021-07-14 NOTE — Assessment & Plan Note (Signed)
Patient shows symptoms consistent with CHF exacerbation.   ?Elevated BNP, hypoxia, Pedal edema, bibasilar crackles. ?Echocardiogram last month showed LVEF 55 to 60%. ?No clinically appears euvolemic, renal functions remained stable so cardiology would like to continue with IV diuresis. ?-Continue Lasix 40 mg IV daily. ?-Daily weight and BMP ?-Strict intake and output ?

## 2021-07-14 NOTE — Progress Notes (Signed)
Physical Therapy Treatment ?Patient Details ?Name: Jenna Dudley ?MRN: [id number] ?DOB: 11/27/1935 ?Today's Date: 07/14/2021 ? ? ?History of Present Illness 86 y.o. female presenting to hospital 4/17 with c/o abdominal pain with SOB.  H/o R 3rd toe amp 06/07/21.  Pt admitted with acute respiratory failure with hypoxia, R pleural effusion, and CHF exacerbation.  PMH includes CVA, dementia, h/o PE on Eliquis, hip sx, inner ear sx, B TKR 2016, reflux, chronic pain, spinal stenosis, CHF, dementia, and bipolar disorder. ? ?  ?PT Comments  ? ? Pt was seen for progression of movement to [facility] and then to walk to bed.  Required complete clean up for her incontinence, which pt did not perceive in the bed.  Pt is distracted with pain and will work on her transition to longer walks as her discomfort is improved and she tolerates better.  Follow acute PT goals for strengthening, balance skills on and off RW, for gait quality and safety and finally to increase endurance OOB in chair and standing.  Pt is medicated for pain and hopefully will be up in the PM to the chair.   ?Recommendations for follow up therapy are one component of a multi-disciplinary discharge planning process, led by the attending physician.  Recommendations may be updated based on patient status, additional functional criteria and insurance authorization. ? ?Follow Up Recommendations ? Home health PT ?  ?  ?Assistance Recommended at Discharge Frequent or constant Supervision/Assistance  ?Patient can return home with the following Assistance with cooking/housework;Assist for transportation;Help with stairs or ramp for entrance;A lot of help with walking and/or transfers;A lot of help with bathing/dressing/bathroom ?  ?Equipment Recommendations ? Rolling walker (2 wheels)  ?  ?Recommendations for Other Services   ? ? ?  ?Precautions / Restrictions Precautions ?Precautions: Fall ?Precaution Comments: R toe surgery ?Restrictions ?Weight Bearing Restrictions: No ?Other  Position/Activity Restrictions: Spouse reports that pt no longer has weightbearing precautions and no longer needed to wear surgical shoe (s/p R 3rd toe amp 06/07/21)  ?  ? ?Mobility ? Bed Mobility ?Overal bed mobility: Needs Assistance ?Bed Mobility: Supine to Sit, Sit to Supine ?  ?  ?Supine to sit: Min guard ?Sit to supine: Min guard ?  ?General bed mobility comments: pt will move but requires help for lines and safety of sequence and to manage her need to get pain meds ?  ? ?Transfers ?Overall transfer level: Needs assistance ?Equipment used: Rolling walker (2 wheels) ?Transfers: Sit to/from Stand, Bed to chair/wheelchair/BSC ?Sit to Stand: Min guard ?  ?Step pivot transfers: Min assist ?  ?  ?  ?General transfer comment: effort to get to [facility] hindered by abd pain which nurse arrived to medicate for once up on [facility] ?  ? ?Ambulation/Gait ?Ambulation/Gait assistance: Min guard ?Gait Distance (Feet): 5 Feet ?Assistive device: Rolling walker (2 wheels) ?Gait Pattern/deviations: Step-through pattern, Decreased stride length, Wide base of support, Trunk flexed ?Gait velocity: decreased ?Gait velocity interpretation: <1.31 ft/sec, indicative of household ambulator ?Pre-gait activities: standing balance ck and cues for the next sequence step ?General Gait Details: posture is guarded on walker but is in pain ? ? ?Stairs ?  ?  ?  ?  ?  ? ? ?Wheelchair Mobility ?  ? ?Modified Rankin (Stroke Patients Only) ?  ? ? ?  ?Balance Overall balance assessment: Needs assistance ?Sitting-balance support: No upper extremity supported, Feet supported ?Sitting balance-Leahy Scale: Good ?Sitting balance - Comments: steady sitting reaching within BOS ?  ?Standing balance support: Bilateral  upper extremity supported, During functional activity ?Standing balance-Leahy Scale: Good ?Standing balance comment: steady ambulating with RW ?  ?  ?  ?  ?  ?  ?  ?  ?  ?  ?  ?  ? ?  ?Cognition Arousal/Alertness: Awake/alert ?Behavior During Therapy:  Anxious, Restless ?Overall Cognitive Status: History of cognitive impairments - at baseline ?  ?  ?  ?  ?  ?  ?  ?  ?  ?  ?  ?  ?  ?  ?  ?  ?General Comments: anxiety to move due to pain on Abdomen ?  ?  ? ?  ?Exercises   ? ?  ?General Comments General comments (skin integrity, edema, etc.): pt is up to [facility] with clear inability to know she was wet.  Pt was assisted to [facility] to allow her to try to void, then when bed was ready returned there.  Possibly very distracted by pain ?  ?  ? ?Pertinent Vitals/Pain Pain Assessment ?Pain Assessment: Faces ?Faces Pain Scale: Hurts even more ?Breathing: occasional labored breathing, short period of hyperventilation ?Negative Vocalization: occasional moan/groan, low speech, negative/disapproving quality ?Facial Expression: sad, frightened, frown ?Body Language: tense, distressed pacing, fidgeting ?Consolability: distracted or reassured by voice/touch ?PAINAD Score: 5  ? ? ?Home Living   ?  ?  ?  ?  ?  ?  ?  ?  ?  ?   ?  ?Prior Function    ?  ?  ?   ? ?PT Goals (current goals can now be found in the care plan section) Acute Rehab PT Goals ?Patient Stated Goal: to go home ?Progress towards PT goals: Progressing toward goals ? ?  ?Frequency ? ? ? Min 2X/week ? ? ? ?  ?PT Plan Current plan remains appropriate  ? ? ?Co-evaluation   ?  ?  ?  ?  ? ?  ?AM-PAC PT "6 Clicks" Mobility   ?Outcome Measure ? Help needed turning from your back to your side while in a flat bed without using bedrails?: A Little ?Help needed moving from lying on your back to sitting on the side of a flat bed without using bedrails?: A Little ?Help needed moving to and from a bed to a chair (including a wheelchair)?: A Lot ?Help needed standing up from a chair using your arms (e.g., wheelchair or bedside chair)?: A Lot ?Help needed to walk in hospital room?: A Lot ?Help needed climbing 3-5 steps with a railing? : Total ?6 Click Score: 13 ? ?  ?End of Session Equipment Utilized During Treatment: Oxygen ?Activity  Tolerance: Treatment limited secondary to agitation (anxiety) ?Patient left: with bed alarm set;with call bell/phone within reach ?Nurse Communication: Mobility status ?PT Visit Diagnosis: Other abnormalities of gait and mobility (R26.89);Muscle weakness (generalized) (M62.81) ?  ? ? ?Time: 6599-3570 ?PT Time Calculation (min) (ACUTE ONLY): 34 min ? ?Charges:  $Therapeutic Activity: 23-37 mins   ?Ivar Drape ?07/14/2021, 4:46 PM ? ?Samul Dada, PT PhD ?Acute Rehab Dept. Number: [facility] [phone] and [phone] ? ? ?

## 2021-07-14 NOTE — Assessment & Plan Note (Signed)
She presented with abdominal pain, shortness of breath, hypoxia.Marland Kitchen ?She was hypoxic requiring 3 L of oxygen slightly higher from her baseline of 2 L. ?BNP 1417, bibasilar crackles on exam, edema+ ?Likely acute diastolic CHF exacerbation. Other differential diagnosis include pneumonia. ?CT Abdomen showed findings consistent with atelectasis versus pneumonia. ?She was started on ceftriaxone and Zithromax. ?Procalcitonin negative, remained afebrile, no leukocytosis. ?Echocardiogram with severe pulmonary hypertension. ?Antibiotics were discontinued ?-Continue with IV diuresis ?-Going for thoracentesis today ?-Now at baseline oxygen requirement of 2 L ?

## 2021-07-14 NOTE — Care Management Important Message (Signed)
Important Message ? ?Patient Details  ?Name: Jenna Dudley ?MRN: [id number] ?Date of Birth: Feb 28, 1936 ? ? ?Medicare Important Message Given:  Yes ? ? ? ? ?[name] ?07/14/2021, 5:25 PM ?

## 2021-07-14 NOTE — Progress Notes (Signed)
Nutrition Follow-up ? ?DOCUMENTATION CODES:  ? ?Severe malnutrition in context of chronic illness ? ?INTERVENTION:  ? ?-Continue regular diet ?-Continue Hormel Shake TID with meals, each supplement provides 520 kcals and 22 grams protein ?-Continue 30 ml Prosource Plus TID, each supplement provides 100 kcals and 15 grams protein ?-Continue MVI with minerals daily ?-Continue feeding assistance with meals ?-If within goals of care, recommending initiation of enteral nutrition support: ? ?Initiate Osmolite 1.2 @ 20 ml/hr and increase by 10 ml every 12 hours to goal rate of 60 ml/hr.  ? ?45 ml Prosource TF daily. ? ?130 ml free water flush every 6 hours   ? ?Tube feeding regimen provides 1768 kcal (100% of needs), 91 grams of protein, and 1097 ml of H2O. Total free water: 1617 ml daily ? ?NUTRITION DIAGNOSIS:  ? ?Severe Malnutrition related to chronic illness (dementia) as evidenced by severe fat depletion, severe muscle depletion. ? ?Ongoing ? ?GOAL:  ? ?Patient will meet greater than or equal to 90% of their needs ? ?Progressing  ? ?MONITOR:  ? ?PO intake, Supplement acceptance, Labs, Weight trends, Skin, I & O's ? ?REASON FOR ASSESSMENT:  ? ?Consult ?Assessment of nutrition requirement/status ? ?ASSESSMENT:  ? ?Jenna Dudley is a 86 y.o. female with PMH significant of chronic diastolic CHF with recent echo shows LVEF 55 to 60%, dementia, depression, GERD, hyperlipidemia, hypertension, bipolar disorder, history of PE on Eliquis was sent from SNF with concern about UTI.  History is obtained from ED records, Patient has developed lower abdominal pain associated with shortness of breath, SNF staff thought patient might have UTI, denies any fever, nausea, vomiting, cough, diarrhea.  They denied any fall, trauma or injury to the head. ? ?4/19- s/p rt thoracentesis (1.2 L removed)  ? ?Reviewed I/O's: -530 ml x 24 hours and -2 L since admission ? ?UOP: 650 ml x 24 hours ? ?Cytology pending on thoracentesis.  ? ?Pt remains  with poor oral intake. Noted meal completions 0-10%. Pt is taking Prosource supplements.   ? ?Pt with history of poor oral intake and malnutrition. Palliative care met with pt husband during admission last month to discuss goals of care and he was undecided about feeding tube at this time. Case discussed with MD regarding possibility of initiating enteral nutrition support and/or involving palliative care. Per MD, palliative care to be consulted.  ? ?Medications reviewed and include remeron and senokot.  ? ?Labs reviewed: K: 3.4.   ? ?Diet Order:   ?Diet Order   ? ?       ?  Diet regular Room service appropriate? Yes; Fluid consistency: Thin  Diet effective now       ?  ? ?  ?  ? ?  ? ? ?EDUCATION NEEDS:  ? ?Not appropriate for education at this time ? ?Skin:  Skin Assessment: Reviewed RN Assessment ? ?Last BM:  07/11/21 ? ?Height:  ? ?Ht Readings from Last 1 Encounters:  ?07/11/21 5' (1.524 m)  ? ? ?Weight:  ? ?Wt Readings from Last 1 Encounters:  ?07/14/21 46.4 kg  ? ? ?Ideal Body Weight:  45.5 kg ? ?BMI:  Body mass index is 19.98 kg/m?. ? ?Estimated Nutritional Needs:  ? ?Kcal:  1600-1800 ? ?Protein:  85-100 grams ? ?Fluid:  > 1.6 L ? ? ? ?Loistine Chance, RD, LDN, CDCES ?Registered Dietitian II ?Certified Diabetes Care and Education Specialist ?Please refer to [facility] for RD and/or RD on-call/weekend/after hours pager  ?

## 2021-07-14 NOTE — Assessment & Plan Note (Signed)
CTA/P: shows pleural effusion on both sides, large on right.Marland Kitchen ?S/p right thoracentesis with removal of 1.2 L.  Resulted in improvement in breathing status.  Preliminary cultures negative, appears to be transudative ?-Continue Lasix 40 mg IV daily. ? ?

## 2021-07-14 NOTE — Progress Notes (Signed)
Mobility Specialist - Progress Note ? ? 07/14/21 1500  ?Mobility  ?Activity Refused mobility  ? ? ? ?Pt fatigued from recent PT session. Will attempt another date/time.  ? ? ?Filiberto Pinks ?Mobility Specialist ?07/14/21, 3:39 PM ? ? ? ? ?

## 2021-07-15 ENCOUNTER — Encounter: Payer: Self-pay | Admitting: Family Medicine

## 2021-07-15 DIAGNOSIS — J9601 Acute respiratory failure with hypoxia: Secondary | ICD-10-CM | POA: Diagnosis not present

## 2021-07-15 DIAGNOSIS — Z515 Encounter for palliative care: Secondary | ICD-10-CM

## 2021-07-15 DIAGNOSIS — D638 Anemia in other chronic diseases classified elsewhere: Secondary | ICD-10-CM | POA: Diagnosis not present

## 2021-07-15 DIAGNOSIS — Z7189 Other specified counseling: Secondary | ICD-10-CM

## 2021-07-15 DIAGNOSIS — E43 Unspecified severe protein-calorie malnutrition: Secondary | ICD-10-CM

## 2021-07-15 DIAGNOSIS — R109 Unspecified abdominal pain: Secondary | ICD-10-CM | POA: Diagnosis not present

## 2021-07-15 DIAGNOSIS — R410 Disorientation, unspecified: Secondary | ICD-10-CM | POA: Diagnosis not present

## 2021-07-15 DIAGNOSIS — R601 Generalized edema: Secondary | ICD-10-CM | POA: Diagnosis not present

## 2021-07-15 LAB — BASIC METABOLIC PANEL
Anion gap: 7 (ref 5–15)
BUN: 23 mg/dL (ref 8–23)
CO2: 37 mmol/L — ABNORMAL HIGH (ref 22–32)
Calcium: 8.7 mg/dL — ABNORMAL LOW (ref 8.9–10.3)
Chloride: 96 mmol/L — ABNORMAL LOW (ref 98–111)
Creatinine, Ser: 0.72 mg/dL (ref 0.44–1.00)
GFR, Estimated: >60 mL/min (ref >60)
Glucose, Bld: 104 mg/dL — ABNORMAL HIGH (ref 70–99)
Potassium: 4.1 mmol/L (ref 3.5–5.1)
Sodium: 140 mmol/L (ref 135–145)

## 2021-07-15 LAB — MAGNESIUM: Magnesium: 1.9 mg/dL (ref 1.7–2.4)

## 2021-07-15 LAB — ACID FAST SMEAR (AFB, MYCOBACTERIA): Acid Fast Smear: NEGATIVE

## 2021-07-15 LAB — CYTOLOGY - NON PAP

## 2021-07-15 MED ORDER — METOPROLOL SUCCINATE ER 25 MG PO TB24
12.5000 mg | ORAL_TABLET | Freq: Every day | ORAL | Status: DC
  Administered 2021-07-15: 12.5 mg via ORAL
  Filled 2021-07-15: qty 1

## 2021-07-15 MED ORDER — FUROSEMIDE 40 MG PO TABS: 40.0000 mg | ORAL_TABLET | Freq: Every day | ORAL | 11 refills | Status: AC

## 2021-07-15 MED ORDER — METOPROLOL SUCCINATE ER 25 MG PO TB24: 12.5000 mg | ORAL_TABLET | Freq: Every day | ORAL | 1 refills | Status: AC

## 2021-07-15 NOTE — Progress Notes (Signed)
Occupational Therapy Treatment ?Patient Details ?Name: Jenna Dudley ?MRN: [id number] ?DOB: 06/11/1935 ?Today's Date: 07/15/2021 ? ? ?History of present illness 86 y.o. female presenting to hospital 4/17 with c/o abdominal pain with SOB.  H/o R 3rd toe amp 06/07/21.  Pt admitted with acute respiratory failure with hypoxia, R pleural effusion, and CHF exacerbation.  PMH includes CVA, dementia, h/o PE on Eliquis, hip sx, inner ear sx, B TKR 2016, reflux, chronic pain, spinal stenosis, CHF, dementia, and bipolar disorder. ?  ?OT comments ? Upon entering the room, pt supine in bed with no c/o pain and agreeable to OT intervention. Pt motivated to get into recliner chair for breakfast. Pt moving fast and needing cuing for safety awareness. No physical assistance for pt to come to EOB. Static sitting balance with close supervision. Pt stands and transfers to recliner chair with min guard without use of AD 5' to chair. Pt needing set up A to open containers. Pt washes face and hands with set up A to obtain needed items. All needs within reach and pt remains in chair eating as therapist exits room.   ? ?Recommendations for follow up therapy are one component of a multi-disciplinary discharge planning process, led by the attending physician.  Recommendations may be updated based on patient status, additional functional criteria and insurance authorization. ?   ?Follow Up Recommendations ? Home health OT  ?  ?Assistance Recommended at Discharge Frequent or constant Supervision/Assistance  ?Patient can return home with the following ? A little help with walking and/or transfers;A little help with bathing/dressing/bathroom;Assistance with cooking/housework;Assist for transportation;Help with stairs or ramp for entrance ?  ?Equipment Recommendations ? Tub/shower seat  ?  ?   ?Precautions / Restrictions Precautions ?Precautions: Fall ?Precaution Comments: R toe surgery ?Restrictions ?Weight Bearing Restrictions: No ?Other  Position/Activity Restrictions: Spouse reports that pt no longer has weightbearing precautions and no longer needed to wear surgical shoe (s/p R 3rd toe amp 06/07/21)  ? ? ?  ? ?Mobility Bed Mobility ?Overal bed mobility: Modified Independent ?Bed Mobility: Supine to Sit ?  ?  ?  ?  ?  ?General bed mobility comments: no physical assistance ?  ? ?Transfers ?Overall transfer level: Needs assistance ?Equipment used: 1 person hand held assist ?Transfers: Sit to/from Stand, Bed to chair/wheelchair/BSC ?Sit to Stand: Min guard ?  ?  ?Step pivot transfers: Min guard ?  ?  ?  ?  ?  ?Balance Overall balance assessment: Needs assistance ?Sitting-balance support: No upper extremity supported, Feet supported ?Sitting balance-Leahy Scale: Good ?Sitting balance - Comments: steady sitting reaching within BOS ?  ?Standing balance support: Bilateral upper extremity supported, During functional activity ?Standing balance-Leahy Scale: Fair ?Standing balance comment: min guard HHA ?  ?  ?  ?  ?  ?  ?  ?  ?  ?  ?  ?   ? ?ADL either performed or assessed with clinical judgement  ? ?ADL Overall ADL's : Needs assistance/impaired ?Eating/Feeding: Set up;Sitting ?Eating/Feeding Details (indicate cue type and reason): to open containers ?Grooming: Wash/dry hands;Wash/dry face;Sitting;Set up;Supervision/safety ?  ?  ?  ?  ?  ?  ?  ?  ?  ?  ?  ?  ?  ?  ?  ?  ?  ?  ? ?Extremity/Trunk Assessment Upper Extremity Assessment ?Upper Extremity Assessment: Generalized weakness ?  ?Lower Extremity Assessment ?Lower Extremity Assessment: Generalized weakness ?  ?  ?  ? ?Vision Patient Visual Report: No change from baseline ?  ?  ?   ?   ? ?  Cognition Arousal/Alertness: Awake/alert ?Behavior During Therapy: Impulsive ?Overall Cognitive Status: History of cognitive impairments - at baseline ?  ?  ?  ?  ?  ?  ?  ?  ?  ?  ?  ?  ?  ?  ?  ?  ?General Comments: Pt is pleasant and cooperative and motivated to get into recliner chair for breakfast ?  ?  ?   ?   ?    ?   ? ? ?Pertinent Vitals/ Pain       Pain Assessment ?Pain Assessment: Faces ?Faces Pain Scale: No hurt ? ?   ?   ? ?Frequency ? Min 2X/week  ? ? ? ? ?  ?Progress Toward Goals ? ?OT Goals(current goals can now be found in the care plan section) ? Progress towards OT goals: Progressing toward goals ? ?Acute Rehab OT Goals ?Patient Stated Goal: to go home ?OT Goal Formulation: With patient ?Time For Goal Achievement: 07/26/21 ?Potential to Achieve Goals: Good  ?Plan Discharge plan remains appropriate;Frequency remains appropriate   ? ?   ?AM-PAC OT "6 Clicks" Daily Activity     ?Outcome Measure ? ? Help from another person eating meals?: A Little ?Help from another person taking care of personal grooming?: A Little ?Help from another person toileting, which includes using toliet, bedpan, or urinal?: A Little ?Help from another person bathing (including washing, rinsing, drying)?: A Little ?Help from another person to put on and taking off regular upper body clothing?: None ?Help from another person to put on and taking off regular lower body clothing?: A Little ?6 Click Score: 19 ? ?  ?End of Session Equipment Utilized During Treatment: Oxygen ? ?OT Visit Diagnosis: Unsteadiness on feet (R26.81);Muscle weakness (generalized) (M62.81);Pain ?  ?Activity Tolerance Patient tolerated treatment well ?  ?Patient Left in chair;with call bell/phone within reach ?  ?Nurse Communication Mobility status ?  ? ?   ? ?Time: [id number] ?OT Time Calculation (min): 14 min ? ?Charges: OT General Charges ?$OT Visit: 1 Visit ?OT Treatments ?$Self Care/Home Management : 8-22 mins ? ?Darleen Crocker, [place], OTR/L , CBIS ?ascom [phone]  ?07/15/21, 12:51 PM  ?

## 2021-07-15 NOTE — Progress Notes (Signed)
Cross Cover ? Nurse reports telemetry informing her patient converted to a fib. Rate in 90's/  EKG reviewed by me shows ST with PAC's.  ?BP stable ?Arrhythmia likely due to heart failure and fluid status. No need to treat unless rate above 120 and symptomatic ?Mag added to labs this am ? ?

## 2021-07-15 NOTE — Progress Notes (Addendum)
CCMD called and reported that pt converted fromSR to Afib and have 10 beats of non sustained vtach. Pt asymtomatic on assessment.VSS. NP Randol Kern made aware and order EKG routine. Will continue to monitor. ? ?Update 0537: EKG resulted NP Randol Kern made aware. Will continue to montior. ?

## 2021-07-15 NOTE — Progress Notes (Addendum)
? ?Progress Note ? ?Patient Name: Jenna Dudley ?Date of Encounter: 07/15/2021 ? ?[facility] Cardiologist: Kathlyn Sacramento, MD  ? ?Subjective  ? ?No complaints, resting comfortably in bed supine ?Breathing at her baseline ?Thoracentesis 2 days ago ?Telemetry reviewed, no atrial fibrillation, sinus rhythm with PACs ? ?Inpatient Medications  ?  ?Scheduled Meds: ? (feeding supplement) PROSource Plus  30 mL Oral TID BM  ? acidophilus  1 capsule Oral Daily  ? apixaban  5 mg Oral BID  ? furosemide  40 mg Intravenous Daily  ? metoprolol succinate  12.5 mg Oral Daily  ? mirtazapine  7.5 mg Oral QHS  ? multivitamin with minerals  1 tablet Oral Daily  ? pantoprazole  40 mg Oral Daily  ? senna-docusate  1 tablet Oral BID  ? simvastatin  40 mg Oral QHS  ? sodium chloride flush  3 mL Intravenous Q12H  ? vitamin B-12  500 mcg Oral Daily  ? ?Continuous Infusions: ? sodium chloride    ? ?PRN Meds: ?sodium chloride, acetaminophen, albuterol, alum & mag hydroxide-simeth, benzonatate, morphine injection, ondansetron (ZOFRAN) IV, phenol, sodium chloride flush  ? ?Vital Signs  ?  ?Vitals:  ? 07/15/21 0148 07/15/21 [id number] 07/15/21 0445 07/15/21 [id number]  ?BP:  (!) 110/57 113/61 (!) 112/54  ?Pulse:  (!) 56  89  ?Resp:  20 20 (!) 22  ?Temp:  98 ?F (36.7 ?C) 98 ?F (36.7 ?C) 98.3 ?F (36.8 ?C)  ?TempSrc:      ?SpO2:  (!) 86% 100% 100%  ?Weight: 46.5 kg     ?Height:      ? ? ?Intake/Output Summary (Last 24 hours) at 07/15/2021 0940 ?Last data filed at 07/15/2021 [id number] ?Gross per 24 hour  ?Intake 360 ml  ?Output 200 ml  ?Net 160 ml  ? ? ?  07/15/2021  ?  1:48 AM 07/14/2021  ?  3:42 AM 07/13/2021  ?  3:27 AM  ?Last 3 Weights  ?Weight (lbs) 102 lb 8 oz 102 lb 4.7 oz 104 lb 0.9 oz  ?Weight (kg) 46.494 kg 46.4 kg 47.2 kg  ?   ? ?Telemetry  ?  ?Normal sinus rhythm with PACs ?No atrial fibrillation seen- Personally Reviewed ? ?ECG  ?  ? - Personally Reviewed ? ?Physical Exam  ? ?GEN: No acute distress.  Thin, frail ?Neck: No JVD ?Cardiac: RRR, with ectopy no  murmurs, rubs, or gallops.  ?Respiratory: Clear to auscultation bilaterally. ?GI: Soft, nontender, non-distended  ?MS: No edema; No deformity. ?Neuro:  Nonfocal  ?Psych: Normal affect  ? ?Labs  ?  ?High Sensitivity Troponin:   ?Recent Labs  ?Lab 07/11/21 ?1601 07/11/21 ?1721  ?TROPONINIHS 89* 81*  ?   ?Chemistry ?Recent Labs  ?Lab 07/11/21 ?1433 07/12/21 ?0631 07/13/21 ?[id number] 07/14/21 ?[id number] 07/15/21 ?0306  ?NA 136 138 137 139 140  ?K 4.1 4.0 3.5 3.4* 4.1  ?CL 95* 95* 94* 97* 96*  ?CO2 33* 36* 38* 40* 37*  ?GLUCOSE 117* 89 83 93 104*  ?BUN 20 18 17 19 23   ?CREATININE 0.66 0.61 0.65 0.69 0.72  ?CALCIUM 8.4* 8.2* 8.0* 8.1* 8.7*  ?MG  --  1.8 1.8  --  1.9  ?PROT 6.2* 6.4* 5.8*  --   --   ?ALBUMIN 3.3* 3.4* 3.0*  --   --   ?AST 22 19 19   --   --   ?ALT 13 12 10   --   --   ?ALKPHOS 45 41 38  --   --   ?  BILITOT 0.4 0.6 0.7  --   --   ?GFRNONAA >60 >60 >60 >60 >60  ?ANIONGAP 8 7 5  2* 7  ?  ?Lipids No results for input(s): CHOL, TRIG, HDL, LABVLDL, LDLCALC, CHOLHDL in the last 168 hours.  ?Hematology ?Recent Labs  ?Lab 07/11/21 ?1433 07/12/21 ?0631 07/13/21 ?[id number]  ?WBC 4.3 4.1 3.2*  ?RBC 2.18* 2.33* 2.23*  2.27*  ?HGB 7.9* 8.5* 8.2*  ?HCT 25.7* 27.2* 26.4*  ?MCV 117.9* 116.7* 118.4*  ?MCH 36.2* 36.5* 36.8*  ?MCHC 30.7 31.3 31.1  ?RDW 22.3* 22.5* 22.1*  ?PLT 133* 167 153  ? ?Thyroid No results for input(s): TSH, FREET4 in the last 168 hours.  ?BNP ?Recent Labs  ?Lab 07/11/21 ?1433 07/11/21 ?1940  ?BNP 1,471.7* 1,334.9*  ?  ?DDimer No results for input(s): DDIMER in the last 168 hours.  ? ?Radiology  ?  ?DG Abd 1 View ? ?Result Date: 07/13/2021 ?CLINICAL DATA:  Abdominal pain. EXAM: ABDOMEN - 1 VIEW COMPARISON:  Same day. FINDINGS: No definite free air is noted. Status post cholecystectomy. Stable gaseous distention of the colon is noted. No small bowel dilatation is noted. IMPRESSION: No definite free air is noted. Stable gaseous distention of colon is noted suggesting ileus. Electronically Signed   By: Marijo Conception M.D.    On: 07/13/2021 15:39  ? ?DG Abd 1 View ? ?Result Date: 07/13/2021 ?CLINICAL DATA:  Severe abdominal pain EXAM: ABDOMEN - 1 VIEW COMPARISON:  Chest radiograph of 1 day prior and abdominal CT 07/11/2021 FINDINGS: Single supine view of the abdomen and pelvis. Gaseous distension of primarily large bowel. Moderate amount of stool within the rectum. No gross free intraperitoneal air. Lucency overlies bowel loops in the left upper quadrant, favored to be endoluminal. Surgical clips in the right upper quadrant. No gross abnormal abdominal calcifications. Cardiomegaly and small bilateral pleural effusions. IMPRESSION: Gaseous distention of bowel, primarily felt to be colonic. Favor adynamic ileus. Lucency in the left upper quadrant, favored to be endoluminal and due to overlapping bowel loops. If concern of free intraperitoneal air, consider upright or decubitus imaging. Electronically Signed   By: Abigail Miyamoto M.D.   On: 07/13/2021 11:58  ? ?DG Chest Port 1 View ? ?Result Date: 07/13/2021 ?CLINICAL DATA:  Status post thoracentesis. EXAM: PORTABLE CHEST 1 VIEW COMPARISON:  July 12, 2021. FINDINGS: No pneumothorax status post thoracentesis. Right pleural effusion is nearly resolved. IMPRESSION: No pneumothorax status post right-sided thoracentesis. Electronically Signed   By: Marijo Conception M.D.   On: 07/13/2021 14:36  ? ?US THORACENTESIS ASP PLEURAL SPACE W/IMG GUIDE ? ?Result Date: 07/13/2021 ?INDICATION: Patient with history of heart failure with bilateral pleural effusions right greater than left with request received for diagnostic and therapeutic thoracentesis. EXAM: ULTRASOUND GUIDED RIGHT THORACENTESIS MEDICATIONS: Local lidocaine 1% only. COMPLICATIONS: None immediate. PROCEDURE: An ultrasound guided thoracentesis was thoroughly discussed with the patient and questions answered. The benefits, risks, alternatives and complications were also discussed. The patient understands and wishes to proceed with the procedure.  Written consent was obtained. Ultrasound was performed to localize and mark an adequate pocket of fluid in the right chest. The area was then prepped and draped in the normal sterile fashion. 1% Lidocaine was used for local anesthesia. Under ultrasound guidance a 19 gauge, 7-cm, Yueh catheter was introduced. Thoracentesis was performed. The catheter was removed and a dressing applied. FINDINGS: A total of approximately 1.2 L of clear yellow fluid was removed. Samples were sent to the laboratory as requested by  the clinical team. IMPRESSION: Successful ultrasound guided right thoracentesis yielding 1.2 L of pleural fluid. This exam was performed by Tsosie Billing PA-C, and was supervised and interpreted by Dr. Serafina Royals. Electronically Signed   By: Ruthann Cancer M.D.   On: 07/13/2021 16:00   ? ?Cardiac Studies  ? ?Echo ? 1. Left ventricular ejection fraction, by estimation, is 55 to 60%. The  ?left ventricle has normal function. The left ventricle has no regional  ?wall motion abnormalities. There is moderate left ventricular hypertrophy.  ?Left ventricular diastolic  ?parameters are indeterminate.  ? 2. Right ventricular systolic function is normal. The right ventricular  ?size is normal. There is severely elevated pulmonary artery systolic  ?pressure. The estimated right ventricular systolic pressure is 123XX123 mmHg.  ? 3. Left atrial size was mildly dilated.  ? 4. The mitral valve is normal in structure. Moderate to severe mitral  ?valve regurgitation. No evidence of mitral stenosis.  ? 5. Tricuspid valve regurgitation is moderate to severe.  ? 6. The aortic valve was not well visualized. Aortic valve regurgitation  ?is not visualized. No aortic stenosis is present.  ? 7. The inferior vena cava is normal in size with <50% respiratory  ?variability, suggesting right atrial pressure of 8 mmHg.  ? ?Patient Profile  ?   ?86 y.o. female with history of HFpEF, pulmonary hypertension, CVA, PAD s/p tow amputation, recent PE,  bipolar disorder, HTN, and HLD who we are seeing for acute on chronic HFpEF. ? ?Assessment & Plan  ? ?Diastolic CHF, severe pulmonary hypertension/bilateral pleural effusions ?Being treated with IV Lasix 40

## 2021-07-15 NOTE — Discharge Summary (Signed)
?Physician Discharge Summary ?  ?Patient: Jenna Dudley MRN: [id number] DOB: [birth year]  ?Admit date:     07/11/2021  ?Discharge date: 07/15/21  ?Discharge Physician: Arnetha Courser  ? ?PCP: Aderoju, Marin Olp, MD  ? ?Recommendations at discharge:  ?Please obtain CBC and BMP in 1 week ?Volume status needs to be managed closely ?Outpatient palliative care referral was provided for goals of care discussion. ?Follow-up with primary care provider and cardiology. ? ?Discharge Diagnoses: ?Principal Problem: ?  Acute respiratory failure with hypoxia (HCC) ?Active Problems: ?  CHF exacerbation (HCC) ?  Pleural effusion on right ?  Thrombocytopenia (HCC) ?  Pulmonary emboli (HCC) ?  Anemia of chronic disease ?  Abdominal pain ?  Delirium with dementia ?  Protein-calorie malnutrition, severe ?  Depression ?  Iron deficiency anemia ? ?Hospital Course: ?Taken from prior notes. ? ?86 years old female with PMH significant for chronic diastolic CHF with recent echo shows LVEF 55 to 60%, dementia, depression, GERD, hyperlipidemia, hypertension, history of PE on Eliquis was sent from SNF with concern about UTI.  Patient has developed lower abdominal pain associated with shortness of breath, SNF staff thought patient might have  UTI. UA unremarkable but patient has developed worsening abdominal pain.  Work-up in the ED showed BNP 1417, CT abdomen and pelvis shows fluid overload,  moderate right, small to moderate left pleural effusion. ?Patient is admitted for acute on chronic hypoxic respiratory failure secondary to CHF exacerbation.  Cardiology is consulted.  IR consulted for possible thoracocentesis. ?Echocardiogram with severe pulmonary hypertension. ? ?4/19: Patient continued to have abdominal pain, no nausea or vomiting.  Apparently did had a bowel movement yesterday.  KUB with gaseous distention most likely colonic, concerning for adynamic ileus.  Will obtain upright films to rule out free air. ?Going for thoracentesis  today. ?Saturating 100% on 2 L of oxygen-which is her baseline. ?We will continue with IV diuresis as renal function remains stable. ?Patient is also on Eliquis and Plavix both, message sent to cardiology if we can discontinue Plavix to decrease the risk of bleeding. ?-Cardiology recommended discontinuing Plavix. ? ?4/20: Thoracentesis was done with removal of 1.2 L of fluid, preliminary labs transudative and cultures remain negative. ?P.o. intake remains very poor.  Apparently case was discussed with husband during prior admission and he was on decisive about any enteral feeding. ?Dietitian is recommending enteral feeding. ?Palliative care was again consulted to discuss goals of care for failure to thrive and poor p.o. intake. ?Currently patient is full code. ? ?Husband wants to take her back home with home health services, he can provide 24/7 care. ?Maximum home health services ordered. ? ?Patient remained stable.  Clinically appears euvolemic.  Cardiology increase the home dose of Lasix from 20 mg to 40 mg daily.  Patient has quite severe pulmonary hypertension and will also need fluid restriction to keep her lungs dry and avoid recurrent pulmonary edema and pleural effusions. ? ?Patient is being discharged on current medication and will need to have a close follow-up with her providers. ? ?Patient with poor prognosis based on her age and underlying comorbidities with severe pulmonary hypertension.  Currently will remain full code and needs a goals of care discussion with palliative care.  Outpatient palliative care referral was provided. ? ? ?Assessment and Plan: ?* Acute respiratory failure with hypoxia (HCC) ?She presented with abdominal pain, shortness of breath, hypoxia.Marland Kitchen ?She was hypoxic requiring 3 L of oxygen slightly higher from her baseline of 2 L. ?BNP 1417,  bibasilar crackles on exam, edema+ ?Likely acute diastolic CHF exacerbation. Other differential diagnosis include pneumonia. ?CT Abdomen showed  findings consistent with atelectasis versus pneumonia. ?She was started on ceftriaxone and Zithromax. ?Procalcitonin negative, remained afebrile, no leukocytosis. ?Echocardiogram with severe pulmonary hypertension. ?Antibiotics were discontinued ?-Continue with IV diuresis ?-Going for thoracentesis today ?-Now at baseline oxygen requirement of 2 L ? ?Pleural effusion on right ?CTA/P: shows pleural effusion on both sides, large on right.Marland Kitchen ?S/p right thoracentesis with removal of 1.2 L.  Resulted in improvement in breathing status.  Preliminary cultures negative, appears to be transudative ?-Continue Lasix 40 mg IV daily. ? ? ?CHF exacerbation (HCC) ?Patient shows symptoms consistent with CHF exacerbation.   ?Elevated BNP, hypoxia, Pedal edema, bibasilar crackles. ?Echocardiogram last month showed LVEF 55 to 60%. ?No clinically appears euvolemic, renal functions remained stable so cardiology would like to continue with IV diuresis. ?-Continue Lasix 40 mg IV daily. ?-Daily weight and BMP ?-Strict intake and output ? ?Thrombocytopenia (HCC) ?She does have chronic thrombocytopenia. ?Her platelet count is at baseline. ? ?Pulmonary emboli (HCC) ?-Continue Eliquis. ? ?Anemia of chronic disease ?There is no obvious bleeding noted.  Anemia panel with anemia of chronic disease, folate normal, B12 pending.  Significant macrocytosis. ?-Monitor H&H.  ?- Transfusion threshold 7.0 ? ?Abdominal pain ?Patient has nonspecific abdominal pain, mild diffuse tenderness more prominent in epigastrium and left lower quadrant.  Per nursing chart did had a bowel movement yesterday.  KUB with dilated bowel, concerning for gaseous distention, mostly colonic.  CT abdomen done yesterday was without any significant abnormality. ?-Try GI cocktail ?-We will obtain upright films to rule out any free air. ?-Bowel regimen ? ?Delirium with dementia ?Patient does have dementia with intermittent behavioral problems. ?-Delirium  precautions ? ? ?Protein-calorie malnutrition, severe ?Estimated body mass index is 19.98 kg/m? as calculated from the following: ?  Height as of this encounter: 5' (1.524 m). ?  Weight as of this encounter: 46.4 kg.  ? ?-Dietitian consult-recommending enteral feeding. ?-Need to be discussed with husband. ?-Palliative care consult ? ?Depression ?Continue Remeron. ? ?  ? ? ?Consultants: Cardiology, IR ?Procedures performed: Thoracentesis ?Disposition: Home health ?Diet recommendation:  ?Discharge Diet Orders (From admission, onward)  ? ?  Start     Ordered  ? 07/15/21 0000  Diet - low sodium heart healthy       ? 07/15/21 1314  ? ?  ?  ? ?  ? ?Cardiac diet ?DISCHARGE MEDICATION: ?Allergies as of 07/15/2021   ? ?   Reactions  ? Aspirin Other (See Comments)  ? Unknown reaction  ? Sulfa Antibiotics Other (See Comments)  ? Unknown reaction  ? ?  ? ?  ?Medication List  ?  ? ?STOP taking these medications   ? ?Acidophilus/Pectin Caps ?  ?clopidogrel 75 MG tablet ?Commonly known as: PLAVIX ?  ?polyethylene glycol 17 g packet ?Commonly known as: MiraLax ?  ?zinc sulfate 220 (50 Zn) MG capsule ?  ? ?  ? ?TAKE these medications   ? ?(feeding supplement) PROSource Plus liquid ?Take 30 mLs by mouth 3 (three) times daily between meals. ?  ?acetaminophen 325 MG tablet ?Commonly known as: TYLENOL ?Take 2 tablets (650 mg total) by mouth every 6 (six) hours as needed for mild pain. ?  ?albuterol 108 (90 Base) MCG/ACT inhaler ?Commonly known as: VENTOLIN HFA ?Inhale 2 puffs into the lungs every 6 (six) hours as needed for wheezing or shortness of breath. ?  ?apixaban 5 MG Tabs tablet ?Commonly  known as: ELIQUIS ?Take 10mg  (2 tablets) twice daily for 4 days, then start 5mg  twice daily. ?What changed:  ?how much to take ?how to take this ?when to take this ?additional instructions ?  ?ascorbic acid 500 MG tablet ?Commonly known as: VITAMIN C ?Take 1 tablet (500 mg total) by mouth 2 (two) times daily. ?  ?benzonatate 100 MG  capsule ?Commonly known as: LawyerTessalon Perles ?Take 1 capsule (100 mg total) by mouth every 6 (six) hours as needed for cough. ?  ?cholecalciferol 1000 units tablet ?Commonly known as: VITAMIN D ?Take 1,000 Units by mouth daily. ?  ?divalproex 125 MG

## 2021-07-15 NOTE — Consult Note (Signed)
? ?                                                                                ?Consultation Note ?Date: 07/15/2021  ? ?Patient Name: Jenna Dudley  ?DOB: 15-Dec-1935  MRN: [id number]  Age / Sex: 86 y.o., female  ?PCP: Aderoju, Jadene Pierini, MD ?Referring Physician: Lorella Nimrod, MD ? ?Reason for Consultation: Establishing goals of care ? ?HPI/Patient Profile: 86 y.o. female  with past medical history of chronic diastolic heart failure with recent echo EF 55 to 60%, dementia, HTN/HLD, GERD, bipolar, history of PE on Eliquis, recently at [facility] for short-term rehab admitted on 07/11/2021 with acute respiratory failure with hypoxia, thoracentesis on 4/20 1.2 L of fluid removed preliminary results negative.  ? ?Clinical Assessment and Goals of Care: ?I have reviewed medical records including EPIC notes, labs and imaging, received report from RN, assessed the patient.  Jenna Dudley is sitting up in the [place] chair in her room.  She greets me, making and somewhat keeping eye contact.  She appears acutely ill/chronically ill and quite frail, with poor dentition.  She has known dementia, but is able to tell me her name.  I do not ask for location.  She states several times during my visit that she is afraid.  Her meal tray is in front of her untouched.  I provided her with sliced banana and open a snack cake.  She is able to pick up the cake, feed herself by breaking off pieces.  I am not sure that she can make her basic needs known.  There is no family at bedside at this time. ? ?Call to husband to discuss diagnosis prognosis, [place], EOL wishes, disposition and options.  I introduced Palliative Medicine as specialized medical care for people living with serious illness. It focuses on providing relief from the symptoms and stress of a serious illness. The goal is to improve quality of life for both the patient and the family. ? ?Hospice and Palliative Care services outpatient were explained and offered.  Mr.  Dudley is agreeable to outpatient palliative services for further goals of care/CODE STATUS discussion.  Provider choice offered, he is agreeable to [facility]. . ? ?Discussed the importance of continued conversation with family and the medical providers regarding overall plan of care and treatment options, ensuring decisions are within the context of the patient?s values and GOCs.  Questions and concerns were addressed. The family was encouraged to call with questions or concerns.  PMT will continue to support holistically. ? ?Conference with attending, bedside nursing staff, transition of care team related to patient condition, needs, goals of care, disposition. ? ? ?HCPOA  ?NEXT OF KIN -spouse, Jenna Dudley ?  ? ?SUMMARY OF RECOMMENDATIONS   ?At this point full scope/full code ?Outpatient palliative services with [facility] ? ? ?Code Status/Advance Care Planning: ?Full code -completed MOLST form March 2023 with NP Jenna Dudley ? ?Symptom Management:  ?Per hospitalist, no additional needs at this time. ? ?Palliative Prophylaxis:  ?Frequent Pain Assessment and Oral Care ? ?Additional Recommendations (Limitations, Scope, Preferences): ?Full Scope Treatment ? ?Psycho-social/Spiritual:  ?Desire for further Chaplaincy support:no ?Additional Recommendations: Caregiving  Support/Resources and Education  on Hospice ? ?Prognosis:  ?Unable to determine, 6 months or less would not be surprising based on chronic illness burden, advancing dementia. ? ?Discharge Planning:  Home with home health/outpatient palliative with [facility]   ? ?  ? ?Primary Diagnoses: ?Present on Admission: ? Pleural effusion on right ? Thrombocytopenia ([place]) ? Anemia of chronic disease ? Delirium with dementia ? Iron deficiency anemia ? Pulmonary emboli ([place]) ? Depression ? Abdominal pain ? Protein-calorie malnutrition, severe ? ? ?I have reviewed the medical record, interviewed the patient and family, and examined the patient. The following aspects are  pertinent. ? ?Past Medical History:  ?Diagnosis Date  ? Bilateral carpal tunnel syndrome 07/27/2014  ? Bipolar disorder ([place])   ? geropsych admission to [facility] in Dec 2018  ? Chronic diastolic CHF (congestive heart failure) ([place])   ? a. 03/2017 Echo: EF 55-60%, no rwma, Gr1 DD, mild MR; b. 06/2019 Echo: EF 55-60%, no rwma, mod LVH, GrII DD. Nl RV size/fxn. RVSP 54.42mmHg. Mildly dil LA.  ? Chronic low back pain 07/29/2014  ? Degenerative arthritis of lumbar spine 07/27/2014  ? Dementia ([place])   ? Depression   ? Esophageal spasm 07/29/2014  ? Gastric reflux 09/17/2014  ? GERD (gastroesophageal reflux disease)   ? History of stress test   ? a. 04/2017 MV: low risk stress test.  EF 55-65%. Probable apical ant/apical, basal, and mid inflat attenuation artifact vs ischemia/scar.  ? Hyperlipemia 07/29/2014  ? Hypertension   ? Pneumonia   ? Spinal stenosis 07/29/2014  ? ?Social History  ? ?Socioeconomic History  ? Marital status: Married  ?  Spouse name: Not on file  ? Number of children: Not on file  ? Years of education: Not on file  ? Highest education level: Not on file  ?Occupational History  ?  Comment: retired  ?Tobacco Use  ? Smoking status: Former  ?  Types: Cigarettes  ?  Quit date: 07/29/1975  ?  Years since quitting: 45.9  ? Smokeless tobacco: Never  ?Vaping Use  ? Vaping Use: Never used  ?Substance and Sexual Activity  ? Alcohol use: No  ?  Alcohol/week: 0.0 standard drinks  ? Drug use: No  ? Sexual activity: Never  ?  Comment: postmenopause  ?Other Topics Concern  ? Not on file  ?Social History Narrative  ? Not on file  ? ?Social Determinants of Health  ? ?Financial Resource Strain: Not on file  ?Food Insecurity: Not on file  ?Transportation Needs: Not on file  ?Physical Activity: Not on file  ?Stress: Not on file  ?Social Connections: Not on file  ? ?Family History  ?Problem Relation Age of Onset  ? Hypertension Mother   ? Stroke Mother   ? ?Scheduled Meds: ? (feeding supplement) PROSource Plus  30 mL Oral TID  BM  ? acidophilus  1 capsule Oral Daily  ? apixaban  5 mg Oral BID  ? furosemide  40 mg Intravenous Daily  ? metoprolol succinate  12.5 mg Oral Daily  ? mirtazapine  7.5 mg Oral QHS  ? multivitamin with minerals  1 tablet Oral Daily  ? pantoprazole  40 mg Oral Daily  ? senna-docusate  1 tablet Oral BID  ? simvastatin  40 mg Oral QHS  ? sodium chloride flush  3 mL Intravenous Q12H  ? vitamin B-12  500 mcg Oral Daily  ? ?Continuous Infusions: ? sodium chloride    ? ?PRN Meds:.sodium chloride, acetaminophen, albuterol, alum & mag hydroxide-simeth, benzonatate, morphine injection,  ondansetron (ZOFRAN) IV, phenol, sodium chloride flush ?Medications Prior to Admission:  ?Prior to Admission medications   ?Medication Sig Start Date End Date Taking? Authorizing Provider  ?acetaminophen (TYLENOL) 325 MG tablet Take 2 tablets (650 mg total) by mouth every 6 (six) hours as needed for mild pain. 06/13/21  Yes Bonnielee Haff, MD  ?albuterol (VENTOLIN HFA) 108 (90 Base) MCG/ACT inhaler Inhale 2 puffs into the lungs every 6 (six) hours as needed for wheezing or shortness of breath. 04/14/21  Yes Kayleen Memos, DO  ?apixaban (ELIQUIS) 5 MG TABS tablet Take 10mg  (2 tablets) twice daily for 4 days, then start 5mg  twice daily. ?Patient taking differently: Take 5 mg by mouth 2 (two) times daily. 06/14/21  Yes Bonnielee Haff, MD  ?ascorbic acid (VITAMIN C) 500 MG tablet Take 1 tablet (500 mg total) by mouth 2 (two) times daily. 06/13/21  Yes Bonnielee Haff, MD  ?benzonatate (TESSALON PERLES) 100 MG capsule Take 1 capsule (100 mg total) by mouth every 6 (six) hours as needed for cough. 04/14/21 04/14/22 Yes Kayleen Memos, DO  ?cholecalciferol (VITAMIN D) 1000 units tablet Take 1,000 Units by mouth daily.   Yes [provider]  ?clopidogrel (PLAVIX) 75 MG tablet Take 1 tablet (75 mg total) by mouth daily. 06/14/21  Yes Bonnielee Haff, MD  ?divalproex (DEPAKOTE SPRINKLE) 125 MG capsule Take 125 mg by mouth 2 (two) times daily.   Yes  [provider]  ?furosemide (LASIX) 40 MG tablet Take 1 tablet (40 mg total) by mouth daily. 07/15/21 07/15/22 Yes Lorella Nimrod, MD  ?Lactobacillus Acid-Pectin (ACIDOPHILUS/PECTIN) CAPS Take 1 capsule by m

## 2021-07-15 NOTE — TOC Transition Note (Signed)
Transition of Care (TOC) - CM/SW Discharge Note ? ? ?Patient Details  ?Name: Jenna Dudley ?MRN: [id number] ?Date of Birth: 11-16-1935 ? ?Transition of Care (TOC) CM/SW Contact:  ?Margarito Liner, LCSW ?Phone Number: ?07/15/2021, 1:47 PM ? ? ?Clinical Narrative:   Patient has orders to discharge home today. [facility] representative is aware. No further concerns. CSW signing off. ? ?Final next level of care: Home w Home Health Services ?Barriers to Discharge: Barriers Resolved ? ? ?Patient Goals and CMS Choice ?Patient states their goals for this hospitalization and ongoing recovery are:: to go home ?CMS Medicare.gov Compare Post Acute Care list provided to:: Patient Represenative (must comment) (spouse [name]) ?Choice offered to / list presented to : Spouse ? ?Discharge Placement ?  ?           ?  ?Patient to be transferred to facility by: Husband ?Name of family member notified: Katiria Calame ?Patient and family notified of of transfer: 07/15/21 ? ?Discharge Plan and Services ?  ?  ?           ?DME Arranged: Shower stool ?DME Agency: [facility] ?Date DME Agency Contacted: 07/15/21 ?  ?Representative spoke with at DME Agency: Bjorn Loser ?HH Arranged: RN, PT, OT, Nurse's Aide, Social Work ?HH Agency: [facility] ?Date HH Agency Contacted: 07/15/21 ?Time HH Agency Contacted: 1430 ?Representative spoke with at [facility] Agency: Feliberto Gottron ? ?Social Determinants of Health (SDOH) Interventions ?  ? ? ?Readmission Risk Interventions ?   ? View : No data to display.  ?  ?  ?  ? ? ? ? ? ?

## 2021-07-15 NOTE — TOC Progression Note (Signed)
Transition of Care (TOC) - Progression Note  ? ? ?Patient Details  ?Name: [name] ?MRN: [id number] ?Date of Birth: [birth year] ? ?Transition of Care (TOC) CM/SW Contact  ?Margarito Liner, LCSW ?Phone Number: ?07/15/2021, 12:42 PM ? ?Clinical Narrative:   Husband said they do have a RW at home already. He is interested in shower stool with back on it. He is aware of private pay price. Ordered through [facility]. ? ?Expected Discharge Plan: Home w Home Health Services ?Barriers to Discharge: Continued Medical Work up ? ?Expected Discharge Plan and Services ?Expected Discharge Plan: Home w Home Health Services ?  ?  ?  ?Living arrangements for the past 2 months: Single Family Home ?                ?DME Arranged: Tub bench, [place] ?  ?  ?  ?  ?HH Arranged: PT, OT, RN ?HH Agency: [facility] ?Date HH Agency Contacted: 07/12/21 ?Time HH Agency Contacted: 1430 ?Representative spoke with at [facility] Agency: Barbara Cower ? ? ?Social Determinants of Health (SDOH) Interventions ?  ? ?Readmission Risk Interventions ?   ? View : No data to display.  ?  ?  ?  ? ? ?

## 2021-07-16 LAB — CULTURE, BLOOD (ROUTINE X 2)
Culture: NO GROWTH
Culture: NO GROWTH
Special Requests: ADEQUATE
Special Requests: ADEQUATE

## 2021-07-17 LAB — BODY FLUID CULTURE W GRAM STAIN
Culture: NO GROWTH
Gram Stain: NONE SEEN

## 2021-07-26 ENCOUNTER — Encounter (INDEPENDENT_AMBULATORY_CARE_PROVIDER_SITE_OTHER): Payer: Self-pay | Admitting: Vascular Surgery

## 2021-07-26 ENCOUNTER — Ambulatory Visit: Payer: Medicare Other | Admitting: Family

## 2021-07-26 ENCOUNTER — Ambulatory Visit (INDEPENDENT_AMBULATORY_CARE_PROVIDER_SITE_OTHER): Payer: Medicare Other | Admitting: Vascular Surgery

## 2021-07-26 VITALS — BP 125/63 | HR 73 | Resp 16

## 2021-07-26 DIAGNOSIS — I739 Peripheral vascular disease, unspecified: Secondary | ICD-10-CM

## 2021-07-26 DIAGNOSIS — I10 Essential (primary) hypertension: Secondary | ICD-10-CM

## 2021-07-26 DIAGNOSIS — N1831 Chronic kidney disease, stage 3a: Secondary | ICD-10-CM | POA: Diagnosis not present

## 2021-07-26 NOTE — Progress Notes (Signed)
? ? ?MRN : [id number] ? ?Jenna Dudley is a 86 y.o. (07-01-1935) female who presents with chief complaint of  ?Chief Complaint  ?Patient presents with  ? Establish Care  ?. ? ?History of Present Illness: Patient returns today in follow up of her peripheral arterial disease.  She is anxious and complaining of shortness of breath and fatigue with activity, but her leg and foot are getting much better.  Her toe surgery site has healed.  No rest pain or ulceration.  Her access site is healed.  No periprocedural complications from her right lower extremity revascularization last month ? ?Current Outpatient Medications  ?Medication Sig Dispense Refill  ? acetaminophen (TYLENOL) 325 MG tablet Take 2 tablets (650 mg total) by mouth every 6 (six) hours as needed for mild pain.    ? albuterol (VENTOLIN HFA) 108 (90 Base) MCG/ACT inhaler Inhale 2 puffs into the lungs every 6 (six) hours as needed for wheezing or shortness of breath. 8 g 0  ? ascorbic acid (VITAMIN C) 500 MG tablet Take 1 tablet (500 mg total) by mouth 2 (two) times daily.    ? benzonatate (TESSALON PERLES) 100 MG capsule Take 1 capsule (100 mg total) by mouth every 6 (six) hours as needed for cough. 30 capsule 0  ? cholecalciferol (VITAMIN D) 1000 units tablet Take 1,000 Units by mouth daily.    ? furosemide (LASIX) 40 MG tablet Take 1 tablet (40 mg total) by mouth daily. 30 tablet 11  ? magnesium hydroxide (MILK OF MAGNESIA) 400 MG/5ML suspension Take 30 mLs by mouth every 12 (twelve) hours as needed for mild constipation.    ? magnesium oxide (MAG-OX) 400 (240 Mg) MG tablet Take 1 tablet (400 mg total) by mouth 2 (two) times daily.    ? metoprolol succinate (TOPROL-XL) 25 MG 24 hr tablet Take 0.5 tablets (12.5 mg total) by mouth daily. 30 tablet 1  ? Multiple Vitamin (MULTIVITAMIN WITH MINERALS) TABS tablet Take 1 tablet by mouth daily.    ? Nutritional Supplements (,FEEDING SUPPLEMENT, PROSOURCE PLUS) liquid Take 30 mLs by mouth 3 (three) times daily between  meals.    ? omeprazole (PRILOSEC) 20 MG capsule Take 20 mg by mouth 2 (two) times daily before a meal.     ? Probiotic Product (PROBIOTIC DAILY) CAPS Take 1 capsule by mouth daily.    ? senna-docusate (SENOKOT-S) 8.6-50 MG tablet Take 1 tablet by mouth 2 (two) times daily. Stool softener (Patient taking differently: Take 2 tablets by mouth 2 (two) times daily. Stool softener) 60 tablet 1  ? simvastatin (ZOCOR) 40 MG tablet Take 40 mg by mouth at bedtime.     ? vitamin B-12 (CYANOCOBALAMIN) 500 MCG tablet Take 1 tablet (500 mcg total) by mouth daily. 100 tablet 2  ? apixaban (ELIQUIS) 5 MG TABS tablet Take 10mg  (2 tablets) twice daily for 4 days, then start 5mg  twice daily. (Patient not taking: Reported on 07/26/2021) 60 tablet   ? divalproex (DEPAKOTE SPRINKLE) 125 MG capsule Take 125 mg by mouth 2 (two) times daily. (Patient not taking: Reported on 07/26/2021)    ? megestrol (MEGACE) 400 MG/10ML suspension Take 10 mLs (400 mg total) by mouth daily. (Patient not taking: Reported on 07/26/2021) 240 mL 0  ? mirtazapine (REMERON) 7.5 MG tablet Take 1 tablet (7.5 mg total) by mouth at bedtime. (Patient not taking: Reported on 07/26/2021)    ? oxyCODONE (OXY IR/ROXICODONE) 5 MG immediate release tablet Take 1 tablet (5 mg total) by mouth  every 6 (six) hours as needed for moderate pain. (Patient not taking: Reported on 07/26/2021) 25 tablet 0  ? QUEtiapine Fumarate (SEROQUEL XR) 150 MG 24 hr tablet Take 150 mg by mouth at bedtime. (Patient not taking: Reported on 07/26/2021)    ? sertraline (ZOLOFT) 50 MG tablet Take 25 mg by mouth daily. (Patient not taking: Reported on 07/26/2021)    ? sucralfate (CARAFATE) 1 g tablet Take 1 tablet (1 g total) by mouth 4 (four) times daily -  with meals and at bedtime. (Patient not taking: Reported on 07/26/2021)    ? ?No current facility-administered medications for this visit.  ? ? ?Past Medical History:  ?Diagnosis Date  ? Bilateral carpal tunnel syndrome 07/27/2014  ? Bipolar disorder ([facility])   ?  geropsych [facility] to [facility] in Dec 2018  ? Chronic diastolic CHF (congestive heart failure) ([facility])   ? a. 03/2017 Echo: EF 55-60%, no rwma, Gr1 DD, mild MR; b. 06/2019 Echo: EF 55-60%, no rwma, mod LVH, GrII DD. Nl RV size/fxn. RVSP 54.776mmHg. Mildly dil LA.  ? Chronic low back pain 07/29/2014  ? Degenerative arthritis of lumbar spine 07/27/2014  ? Dementia ([facility])   ? Depression   ? Esophageal spasm 07/29/2014  ? Gastric reflux 09/17/2014  ? GERD (gastroesophageal reflux disease)   ? History of stress test   ? a. 04/2017 MV: low risk stress test.  EF 55-65%. Probable apical ant/apical, basal, and mid inflat attenuation artifact vs ischemia/scar.  ? Hyperlipemia 07/29/2014  ? Hypertension   ? Pneumonia   ? Spinal stenosis 07/29/2014  ? ? ?Past Surgical History:  ?Procedure Laterality Date  ? AMPUTATION TOE Right 06/07/2021  ? Procedure: AMPUTATION TOE;  Surgeon: Felecia ShellingEvans, Brent M, DPM;  Location: [facility] ORS;  Service: Podiatry;  Laterality: Right;  ? HIP SURGERY Right   ? INNER EAR SURGERY Right   ? LOWER EXTREMITY ANGIOGRAPHY N/A 06/06/2021  ? Procedure: Lower Extremity Angiography;  Surgeon: Annice Needyew, Taimi Towe S, MD;  Location: [facility] INVASIVE CV LAB;  Service: Cardiovascular;  Laterality: N/A;  ? REPLACEMENT TOTAL KNEE BILATERAL Bilateral 07/27/14  ? ? ? ?Social History  ? ?Tobacco Use  ? Smoking status: Former  ?  Types: Cigarettes  ?  Quit date: 07/29/1975  ?  Years since quitting: 46.0  ? Smokeless tobacco: Never  ?Vaping Use  ? Vaping Use: Never used  ?Substance Use Topics  ? Alcohol use: No  ?  Alcohol/week: 0.0 standard drinks  ? Drug use: No  ? ?  ? ? ?Family History  ?Problem Relation Age of Onset  ? Hypertension Mother   ? Stroke Mother   ? ? ? ?Allergies  ?Allergen Reactions  ? Aspirin Other (See Comments)  ?  Unknown reaction  ? Sulfa Antibiotics Other (See Comments)  ?  Unknown reaction  ? ? ? ?REVIEW OF SYSTEMS (Negative unless checked) ? ?Constitutional: [] Weight loss  [] Fever  [] Chills ?Cardiac: [] Chest pain   [] Chest  pressure   [] Palpitations   [] Shortness of breath when laying flat   [x] Shortness of breath at rest   [x] Shortness of breath with exertion. ?Vascular:  [] Pain in legs with walking   [] Pain in legs at rest   [] Pain in legs when laying flat   [] Claudication   [] Pain in feet when walking  [] Pain in feet at rest  [] Pain in feet when laying flat   [] History of DVT   [] Phlebitis   [] Swelling in legs   [] Varicose veins   [x] Non-healing ulcers ?Pulmonary:   []   Uses home oxygen   [] Productive cough   [] Hemoptysis   [] Wheeze  [] COPD   [] Asthma ?Neurologic:  [] Dizziness  [] Blackouts   [] Seizures   [] History of stroke   [] History of TIA  [] Aphasia   [] Temporary blindness   [] Dysphagia   [] Weakness or numbness in arms   [] Weakness or numbness in legs ?Musculoskeletal:  [x] Arthritis   [] Joint swelling   [] Joint pain   [x] Low back pain ?Hematologic:  [] Easy bruising  [] Easy bleeding   [] Hypercoagulable state   [] Anemic   ?Gastrointestinal:  [] Blood in stool   [] Vomiting blood  [x] Gastroesophageal reflux/heartburn   [] Abdominal pain ?Genitourinary:  [] Chronic kidney disease   [] Difficult urination  [] Frequent urination  [] Burning with urination   [] Hematuria ?Skin:  [] Rashes   [x] Ulcers   [x] Wounds ?Psychological:  [] History of anxiety   []  History of major depression. ? ?Physical Examination ? ?BP 125/63 (BP Location: Left Arm)   Pulse 73   Resp 16   LMP  (LMP Unknown)  ?Gen:  elderly, frail, and anxious appearing ?Head: [place]/AT, + temporalis wasting. ?Ear/Nose/Throat: Hearing grossly intact, nares w/o erythema or drainage ?Eyes: Conjunctiva clear. Sclera non-icteric ?Neck: Supple.  Trachea midline ?Pulmonary:  Good air movement, no use of accessory muscles.  ?Cardiac: RRR, no JVD ?Vascular:  ?Vessel Right Left  ?Radial Palpable Palpable  ?    ?    ?    ?    ?    ?    ?PT 1+ Palpable 1+ Palpable  ?DP Trace Palpable 1+ Palpable  ? ?Gastrointestinal: soft, non-tender/non-distended. No guarding/reflex.  ?Musculoskeletal: M/S 5/5  throughout.  No deformity or atrophy. No edema. ?Neurologic: Sensation grossly intact in extremities.  Symmetrical.  Speech is fluent.  ?Psychiatric: Judgment intact, Mood & affect appropriate for pt's clinical

## 2021-07-27 NOTE — Assessment & Plan Note (Signed)
blood pressure control important in reducing the progression of atherosclerotic disease. On appropriate oral medications.  

## 2021-07-27 NOTE — Assessment & Plan Note (Signed)
No worsening renal function after angiogram ?

## 2021-07-27 NOTE — Assessment & Plan Note (Signed)
Doing well status post right lower extremity revascularization for ulceration and gangrenous changes.  Wound is healing.  No significant pain.  Return with noninvasive studies in the near future.  Continue current medical regimen. ?

## 2021-07-29 LAB — MISC LABCORP TEST (SEND OUT): Labcorp test code: 9985

## 2021-08-02 ENCOUNTER — Other Ambulatory Visit: Payer: Medicare Other | Admitting: Primary Care

## 2021-08-02 DIAGNOSIS — Z515 Encounter for palliative care: Secondary | ICD-10-CM

## 2021-08-02 DIAGNOSIS — E43 Unspecified severe protein-calorie malnutrition: Secondary | ICD-10-CM

## 2021-08-02 NOTE — Progress Notes (Signed)
? ? ?Manufacturing engineer ?Community Palliative Care Consult Note ?Telephone: [phone]  ?Fax: [phone]  ? ? ?Date of encounter: 08/02/21 ?12:57 PM ?PATIENT NAME: Jenna Dudley ?[place] ?[place] [address]   ?[phone] (home)  ?DOB: [birth year] ?MRN: [id number] ?PRIMARY CARE PROVIDER:    ?Aderoju, Jadene Pierini, MD,  ?[address] ?[place] [address] ?[phone] ? ?REFERRING PROVIDER:   ?Aderoju, Jadene Pierini, MD ?[address] ?[place],  [place] [address] ?[phone] ? ?RESPONSIBLE PARTY:    ?Contact Information   ? ? Name Relation Home Work Mobile  ? Peyton, [place] Spouse [phone]  [phone]  ? [place] Daughter [phone]  [phone]  ? ?  ? ? ?I met face to face with patient and family in  home. Palliative Care was asked to follow this patient by consultation request of  Aderoju, Judithann Sheen* to address advance care planning and complex medical decision making. This is a follow up visit. ? ?                                 ASSESSMENT AND PLAN / RECOMMENDATIONS:  ? ?Advance Care Planning/Goals of Care: Goals include to maximize quality of life and symptom management. Patient/health care surrogate gave his/her permission to discuss.Our advance care planning conversation included a discussion about:    ?The value and importance of advance care planning  ?Exploration of personal, cultural or spiritual beliefs that might influence medical decisions  ?Exploration of goals of care in the event of a sudden injury or illness  ?Identification of a healthcare agent - no one official, discussed with husband. Has 5 children, which he helped raise. ?Review  of an  advance directive document . ?CODE STATUS: FULL CODE ? ?Symptom Management/Plan: ? ?I met with patient and her husband in their home. Patient was in her bed and stated she wanted to remain in her room. She said she did not feel well and wanted me to speak with her husband in the living room. Has been said she has  been crying, and thinking he's going to leave her.  ? ?Depression: He outlines a very long history of depression, including many electric shock therapy treatments. He states this would usually help in the past, but as time went on, there was less and  less benefit.  ? ?Mood: We reviewed medications. [facility], Many of them have changed. I looked at her  List from her primary in September 9/22 and there are many changes in her psychiatric medications. I would recommend that her primary review this earlier than one month if possible as that is her next appointment. I would also recommend a referral to geriatric psychiatry, perhaps at [facility] or neurology, as patient seems to be mostly  plagued by depression and anxiety, and abdominal pain for which little etiology can be found. Husband stated that she was doing better emotionally on her previous protocol.  ? ?Intake: She is eating fairly well ;he stated that she eats 2 to 3 good meals a day. She is currently on 7 1/2 mg of Remeron, but he states , they do not have any in the house. I can send in this prescription if needed.  ? ?Mobility she is able to ambulate in the home independently and uses a cane when going outdoors. ? ? ADL support husband states that he is her only caregiver and is planning to  seek  out any additional benefits she may have under the [place]. He is a retired Actor.  ? ?G.I. pain. I reviewed labs and imaging. She had imaging done for this issue in September 22, as well as April 23. Some possible constipation or ileus. Carafate is on her most recent med list but none was dispensed. She does have a history of Jerrye Bushy and takes PPI for that.  ? ?Some of her somatic symptoms could be based in her depression and anxiety. As stated above one of the themes is, she seems very frightened that her husband will leave her. She has a psychiatry referral, but not for another month in the [facility] system, but I do  feel she also needs some neurology to address her dementia  as well . ? ?Follow up Palliative Care Visit: Palliative care will continue to follow for complex medical decision making, advance care planning, and clarification of goals. Return 4 weeks or prn. ? ?I spent 60 minutes providing this consultation. More than 50% of the time in this consultation was spent in counseling and care coordination. ? ?PPS: 40% ? ?HOSPICE ELIGIBILITY/DIAGNOSIS: TBD ? ?Chief Complaint: debility ? ?HISTORY OF PRESENT ILLNESS:  Jenna Dudley is a 86 y.o. year old female  with depression, dementia, debility, weight loss, immobility . Patient seen today to review palliative care needs to include medical decision making and advance care planning as appropriate.  ? ?History obtained from review of EMR, discussion with primary team, and interview with family, facility staff/caregiver and/or Jenna Dudley.  ?I reviewed available labs, medications, imaging, studies and related documents from the EMR.  Records reviewed and summarized above.  ? ?ROS/family  ? ? ?General: tearful ?ENMT: denies dysphagia ?Pulmonary: denies cough, denies increased SOB ?Abdomen: endorses fair to good  appetite, denies constipation, endorses continence of bowel ?GU: denies dysuria, endorses continence of urine ?MSK:  denies  increased weakness,  no falls reported ?Skin: denies rashes or wounds ?Neurological: denies pain, denies insomnia ?Psych: Endorses anxious  mood ? ?Physical Exam: ?Current and past weights:102 lbs reported ?Constitutional: NAD ?General: frail appearing, thin ?EYES: anicteric sclera, lids intact, no discharge  ?ENMT: intact hearing, oral mucous membranes moist, dentition intact ?CV: no LE edema ?Pulmonary: no increased work of breathing, no cough, room air ?Abdomen: intake 70%, no ascites ?MSK: + sarcopenia, moves all extremities, ambulatory ?Skin: warm and dry, no rashes or wounds on visible skin ?Neuro:  + generalized weakness,  severe  cognitive  impairment, anxious affect ? ? ?Thank you for the opportunity to participate in the care of Jenna Dudley.  The palliative care team will continue to follow. Please call our office at [phone] if we can be of additional assistance.  ? ?Jason Coop, NP DNP, AGPCNP-BC ? ?COVID-19 PATIENT SCREENING TOOL ?Asked and negative response unless otherwise noted:  ? ?Have you had symptoms of covid, tested positive or been in contact with someone with symptoms/positive test in the past 5-10 days?  ? ?

## 2021-08-11 ENCOUNTER — Ambulatory Visit: Payer: Medicare Other | Admitting: Family

## 2021-08-16 ENCOUNTER — Ambulatory Visit: Payer: Medicare Other | Admitting: Family

## 2021-08-27 LAB — ACID FAST CULTURE WITH REFLEXED SENSITIVITIES (MYCOBACTERIA): Acid Fast Culture: NEGATIVE

## 2021-08-29 ENCOUNTER — Other Ambulatory Visit: Payer: Medicare Other | Admitting: Primary Care

## 2021-08-29 VITALS — BP 110/70 | HR 75 | Temp 97.2°F | Ht 60.0 in | Wt 111.0 lb

## 2021-08-29 DIAGNOSIS — R0600 Dyspnea, unspecified: Secondary | ICD-10-CM

## 2021-08-29 DIAGNOSIS — I5033 Acute on chronic diastolic (congestive) heart failure: Secondary | ICD-10-CM

## 2021-08-29 DIAGNOSIS — R5381 Other malaise: Secondary | ICD-10-CM

## 2021-08-29 DIAGNOSIS — R1084 Generalized abdominal pain: Secondary | ICD-10-CM

## 2021-08-29 NOTE — Progress Notes (Signed)
[facility] Palliative Care Consult Note Telephone: [phone]  Fax: [phone]    Date of encounter: 08/29/21 1:00 PM PATIENT NAME: Jenna Dudley [place] [address]   [phone] (home)  DOB: Jun 16, 1935 MRN: [id number] PRIMARY CARE PROVIDER:    Jovita Kussmaul, MD,  [place] [address] [phone]  REFERRING PROVIDER:   Jovita Kussmaul, [place],  [place] [address] [phone]  RESPONSIBLE PARTY:    Contact Information     Name Relation Home Work Mobile   [place] Spouse [phone]  [phone]   [place] Daughter [phone]  [phone]        I met face to face with patient and family in  home. Palliative Care was asked to follow this patient by consultation request of  Aderoju, Judithann Sheen* to address advance care planning and complex medical decision making. This is a follow up visit.                                   ASSESSMENT AND PLAN / RECOMMENDATIONS:   Advance Care Planning/Goals of Care: Goals include to maximize quality of life and symptom management. Patient/health care surrogate gave his/her permission to discuss.Our advance care planning conversation included a discussion about:     Exploration of personal, cultural or spiritual beliefs that might influence medical decisions  Exploration of goals of care in the event of a sudden injury or illness  Identification of a healthcare agent  - husband CODE STATUS: FULL Code Goals of care we discussed husband states he would like to be conservative and treat the treatable. They would prefer as much out of hospital care as possible.  Limited scope but Full code options I've also reached out to the heart failure clinic and they will see her on Friday. There were three canceled appointments. We reviewed the need for them to keep these appointments.  Husband states he will have her to clinic on  Friday.  Symptom Management/Plan:  I met with patient and husband in their home. Today she is sitting on the sofa, able to sit up and speak with me.   Dyspnea: She's registering 79% on her oxygen and we have verified that this is accurate across several machines and several people. She is slightly dyspneic,  but is also getting anxious thinking she's done something wrong. [facility] states that [facility] sometimes they would tell her that her oxygen level did not change with supplemental oxygen. However, I will send out oxygen at 2 L to see if this helps improve. She does endorse dyspnea on exertion and home health has also documented less than 80% after ambulation.   Lungs are clear to auscultation, she does not complain of pain on breathing. Husband states this is her norm and that she actually appears to feel better than she did over the weekend. They would like to avoid ER trip due to her dementia and anxiety and debility.  Nutrition: PO she is taking modest amount of PO probably is dehydrated due to volume per day of water less than 32 ounces. Encouraged to improve her PO intake.    Mood. Patient had been very tearful, very agitated on my last visit. Today she is relaxed and interactive. She is anxious as we discuss her breathing, feeling she did something wrong. Her current protocol is 50 mg of  quetiapine  at hs, and 25 milligrams of sertraline daily . Husband endorses this improved her very much after about a week and he is very happy with this new protocol. Prior to that she was agitated and tearful , and he was not able to provide care. Today she endorses that he is a good caregiver.   Constipation and Pain: She endorses ongoing pain in her right foot and left shoulder. She also continues to endorses  abdominal pain. She is on  opioids for  MSK pain control, and we discussed the constipating properties of opioids. Review of imaging shows in April she had a possible ileus. We talked about  the MiraLAX she's taking would not be effective, given her very small amount of liquid intake. I suggested senna in addition due to poor liquid intake.    Follow up Palliative Care Visit: Palliative care will continue to follow for complex medical decision making, advance care planning, and clarification of goals. Return 4-6 weeks or prn.  This visit was coded based on medical decision making (MDM).  PPS: 40%  HOSPICE ELIGIBILITY/DIAGNOSIS: TBD  Chief Complaint: debility, dyspnea  HISTORY OF PRESENT ILLNESS:  DAJIA GUNNELS is a 86 y.o. year old female  with hypoxia, debility, dyspnea,  dementia, poor po intake. Patient seen today to review palliative care needs to include medical decision making and advance care planning as appropriate.   History obtained from review of EMR, discussion with primary team, and interview with family, facility staff/caregiver and/or Ms. Perry.  I reviewed available labs, medications, imaging, studies and related documents from the EMR.  Records reviewed and summarized above.   ROS   General: NAD ENMT: denies dysphagia Cardiovascular: denies chest pain, endorses  DOE Pulmonary: denies cough, denies increased SOB, has baseline SOB Abdomen: endorses poor appetite, denies constipation, endorses continence of bowel GU: denies dysuria, endorses continence of urine MSK:  denies  increased weakness,  no falls reported Skin: denies rashes or wounds, Sore R second toe Neurological: endorses pain, denies insomnia Psych: Endorses anxious mood  Physical Exam: Current and past weights: 111 lbs  Constitutional: 97.2 HR 46-75 (radial to apical) Today's Vitals   08/29/21 1337  BP: 110/70  Pulse: 75  Temp: (!) 97.2 F (36.2 C)  Weight: 111 lb (50.3 kg)  Height: 5' (1.524 m)   Body mass index is 21.68 kg/m.  General: frail appearing, thin EYES: anicteric sclera, lids intact, no discharge  ENMT: intact hearing, oral mucous membranes moist, dentition   missing CV: S1S2, irreg rate, no LE edema, apical 75, radial 46 bpm Pulmonary: LCTA, slight  increased work of breathing, no cough, room air, 79% Room air Abdomen: intake 50%, normo-active BS + 4 quadrants, soft and non tender, no ascites MSK: + sarcopenia, moves all extremities, ambulatory with walker  Skin: warm and dry, R second toe slightly discolored, pt endorses tenderness Neuro:  + generalized weakness,  + cognitive impairment, anxious affect  Thank you for the opportunity to participate in the care of Ms. Strand.  The palliative care team will continue to follow. Please call our office at [phone] if we can be of additional assistance.   Jason Coop, NP DNP, AGPCNP-BC  COVID-19 PATIENT SCREENING TOOL Asked and negative response unless otherwise noted:   Have you had symptoms of covid, tested positive or been in contact with someone with symptoms/positive test in the past 5-10 days?

## 2021-08-30 ENCOUNTER — Telehealth: Payer: Self-pay

## 2021-08-30 NOTE — Telephone Encounter (Signed)
1025 am.  Message received from [employer] they were attempting to reach patient for co-pay on O2 but were unable to reach anyone.  Phone call made to spouse Tegan Mcfarland and he advised he contacted [employer] after 5 yesterday and addressed the co-pay.  He was not given an ETA but was told it would likely be today.  Advised Mr. Wiman that I would follow up with [employer] and have them contact him with an ETA.  Message sent to [employer] through [place]. Message received from [employer] that the driver is already out with the DME for delivery.

## 2021-08-31 ENCOUNTER — Telehealth: Payer: Self-pay | Admitting: Primary Care

## 2021-08-31 NOTE — Telephone Encounter (Signed)
  Received a call from home physical therapy assistant. He was calling for clarity on the oxygen orders for patient husband. Initiated 2 L of oxygen this week and patient's husband was not sure if it was continuous or as needed. He states that the cannula fell out overnight and when he checked her oxygen level this morning it was 88%. When she's on the oxygen 2 L it's 95 to 98%. I instructed him to leave it on continuously.   We discussed the home health regimen that is likely coming to a close with goals largely having been met. Patient is continuing to be more immobile and more debilitated. We discussed hospice services as a transition after this attempt for rehab. He would like to speak with primary and heart failure clinic, which have two appointments in the next week. We discussed this would be a good plan as home health will finish in the next several weeks. He will have time to consult with other providers and can decide if this is appropriate  in the next several weeks. Husband voiced understanding and oxygen units and follow up. I've given him my nurse's name for follow up as I will be out of the office for the next two weeks.

## 2021-09-02 ENCOUNTER — Ambulatory Visit: Payer: Medicare Other | Admitting: Family

## 2021-09-02 NOTE — Progress Notes (Deleted)
Patient ID: Jenna Dudley, female    DOB: [birth year], 86 y.o.   MRN: [id number]  Jenna Dudley is a 86 y/o female with a history of hyperlipidemia, HTN, spinal stenosis, GERD, depression, bipolar, dementia, remote tobacco use and chronic heart failure.   Echo report from 06/03/21 reviewed and showed an EF of 55-60% along with moderate LVH, mild LAE, moderate/severe MR/ TR and severely elevated PA pressure of 85.3 mmHg. Echo from 12/11/20 - Left ventricular ejection fraction, by estimation, is 60 to 65%. The left ventricle has normal function. The left ventricle has no regional wall motion abnormalities. There is moderate to severe left ventricular hypertrophy with near cavity obliteration in systole.  Admitted 07/11/21 due to lower abdominal pain and SOB. CT abdomen and pelvis shows fluid overload,  moderate right, small to moderate left pleural effusion. Cardiology and palliative care consults. Thoracentesis performed with removal of 1.2L. Initially given IV lasix with transition to oral diuretics. Given antibiotics for possible pneumonia. PT/OT evaluations done. Discharged after 4 days. Admitted 06/01/21 due to chest pain. CTA chest showed segmental PEs, right side opacity with a fluid level along with right sided pleural effusion. Given IVF and IV antibiotics. Pulmonology, vascular, podiatry and palliative care consults obtained. Also had a left third toe osteomyelitis peripheral vascular disease.  Angioplasty and stent placement was performed on 3/13.  Underwent toe amputation on 3/14. PT/OT/speech evaluations done. Initially given IV lasix with transition to oral diuretics. Given IV heparin with transition to apixaban. Discharged after 13 days.   She presents today for a follow-up visit with a chief complaint of   Past Medical History:  Diagnosis Date   Bilateral carpal tunnel syndrome 07/27/2014   Bipolar disorder ([place])    geropsych admission to [facility] in Dec 2018   Chronic diastolic CHF  (congestive heart failure) ([place])    a. 03/2017 Echo: EF 55-60%, no rwma, Gr1 DD, mild MR; b. 06/2019 Echo: EF 55-60%, no rwma, mod LVH, GrII DD. Nl RV size/fxn. RVSP 54.67mmHg. Mildly dil LA.   Chronic low back pain 07/29/2014   Degenerative arthritis of lumbar spine 07/27/2014   Dementia ([place])    Depression    Esophageal spasm 07/29/2014   Gastric reflux 09/17/2014   GERD (gastroesophageal reflux disease)    History of stress test    a. 04/2017 MV: low risk stress test.  EF 55-65%. Probable apical ant/apical, basal, and mid inflat attenuation artifact vs ischemia/scar.   Hyperlipemia 07/29/2014   Hypertension    Pneumonia    Spinal stenosis 07/29/2014   Past Surgical History:  Procedure Laterality Date   AMPUTATION TOE Right 06/07/2021   Procedure: AMPUTATION TOE;  Surgeon: Edrick Kins, DPM;  Location: [facility] ORS;  Service: Podiatry;  Laterality: Right;   HIP SURGERY Right    INNER EAR SURGERY Right    LOWER EXTREMITY ANGIOGRAPHY N/A 06/06/2021   Procedure: Lower Extremity Angiography;  Surgeon: Algernon Huxley, MD;  Location: [facility];  Service: Cardiovascular;  Laterality: N/A;   REPLACEMENT TOTAL KNEE BILATERAL Bilateral 07/27/14   Family History  Problem Relation Age of Onset   Hypertension Mother    Stroke Mother    Social History   Tobacco Use   Smoking status: Former    Types: Cigarettes    Quit date: 07/29/1975    Years since quitting: 46.1   Smokeless tobacco: Never  Substance Use Topics   Alcohol use: No    Alcohol/week: 0.0 standard drinks of alcohol  Allergies  Allergen Reactions   Aspirin Other (See Comments)    Unknown reaction   Sulfa Antibiotics Other (See Comments)    Unknown reaction     Review of Systems  Constitutional:  Positive for fatigue (tires easily). Negative for appetite change (improving) and fever.  HENT:  Negative for congestion, postnasal drip and sore throat.   Eyes: Negative.   Respiratory:  Positive for shortness of breath  (easily). Negative for cough, chest tightness and wheezing.   Cardiovascular:  Negative for chest pain, palpitations and leg swelling.  Gastrointestinal:  Negative for abdominal distention and abdominal pain.  Endocrine: Negative.   Genitourinary: Negative.  Negative for difficulty urinating, dysuria and frequency.  Musculoskeletal:  Positive for back pain. Negative for neck pain.  Skin: Negative.   Allergic/Immunologic: Negative.   Neurological:  Negative for dizziness, syncope and light-headedness.  Hematological:  Negative for adenopathy. Bruises/bleeds easily.  Psychiatric/Behavioral:  Negative for dysphoric mood and sleep disturbance (sleeping on 2 pillows). The patient is nervous/anxious.       Physical Exam Vitals and nursing note reviewed. Exam conducted with a chaperone present (husband).  Constitutional:      Appearance: She is well-developed.  HENT:     Head: Normocephalic and atraumatic.  Neck:     Vascular: No carotid bruit or JVD.  Cardiovascular:     Rate and Rhythm: Normal rate and regular rhythm.  Pulmonary:     Effort: Pulmonary effort is normal.     Breath sounds: No wheezing or rales.  Abdominal:     General: There is no distension.     Palpations: Abdomen is soft.     Tenderness: There is no abdominal tenderness.  Musculoskeletal:        General: No tenderness.     Cervical back: Normal range of motion and neck supple.  Skin:    General: Skin is warm and dry.     Coloration: Skin is pale. Skin is not jaundiced.  Neurological:     Mental Status: She is alert and oriented to person, place, and time.  Psychiatric:        Behavior: Behavior normal.     Comments: Alert and oriented, calm but anxious appearing, defers to her husband to answer questions, repeats that she is "wanting to leave and go back"    Assessment & Plan:  1: Chronic heart failure with preserved ejection fraction with structural changes (LVH)- - NYHA class III - euvolemic - being  weighed daily at facility and weight ranges from 117-118 per their weight log - unable to stand to be weighed today as she says that she's too unsteady - not adding salt & husband says her appetite is improving - saw cardiology Kathlen Mody) 01/28/21 - palliative care visit done 08/29/21 - BNP 07/11/21 was 1334.9  2: HTN-  - BP  - saw PCP (Aderoju) 08/10/21 - BMP 07/15/21 reviewed & showed sodium 140, potassium 4.1, creatinine 0.72 & GFR >60  3: Bipolar- - patient very anxious in the office today - asking repeatedly if we're almost done so she can return to the facility - wearing oxygen at 2L but husband thought it was supposed to be on 3L; instructed him to f/u with facility regarding this order   Facility medication list reviewed.

## 2021-09-07 ENCOUNTER — Ambulatory Visit: Payer: Medicare Other | Admitting: Family

## 2021-09-15 ENCOUNTER — Other Ambulatory Visit: Payer: Medicare Other

## 2021-09-15 ENCOUNTER — Telehealth: Payer: Self-pay

## 2021-09-15 DIAGNOSIS — Z515 Encounter for palliative care: Secondary | ICD-10-CM

## 2021-09-15 NOTE — Telephone Encounter (Signed)
410 pm.  Phone call made to spouse [name] to follow up on patient condition.  No answer.  Message left requesting a call back.

## 2021-09-15 NOTE — Progress Notes (Signed)
PATIENT NAME: Jenna Dudley DOB: 1935-08-17 MRN: [id number]  PRIMARY CARE PROVIDER: Toya Smothers, MD  RESPONSIBLE PARTY:  Acct ID - Guarantor Home Phone Work Phone Relationship Acct Type  [id number] BREI, POCIASK [phone]  Self P/F     [address], [place], [place] [address]    I connected with  [name] on 09/15/21 by telephone and verified that I am speaking with the correct person using two identifiers.   I discussed the limitations of evaluation and management by telemedicine. The patient expressed understanding and agreed to proceed.   Incoming call from [facility].  He advises patient has been doing well overall.  Oxygen remains on continuous 2 L via [place] with oxygen saturations in the mid to upper 90's.  Spouse is weighing patient almost daily with most recent weight of 111 lbs.  Weights have remained stable and no visible edema present per spouse. HF clinic appointment was canceled for this month due to patient's anxiety level.  Spouse advises they are working to address this and once patient is more stable they will follow back up with HF clinic.  Patient sustained a fall 2 days ago when she tripped over the oxygen tubing trying to get to the bathroom.  HH nurse did a visit on the same day and assessed patient.  No injuries were noted and patient has not had any complaints of pain.   HH RN is seeing patient weekly and assessing overall status, obtaining vital signs, checking feet to ensure no skin breakdown is present, checking medications and reporting back to PCP office. Spouse has no new concerns at this time.   Patient will be seen by Clearnce Sorrel, NP for Palliative Care on 09/29/21.  Truitt Merle, RN

## 2021-09-29 ENCOUNTER — Other Ambulatory Visit: Payer: Medicare Other | Admitting: Primary Care

## 2021-09-29 DIAGNOSIS — F05 Delirium due to known physiological condition: Secondary | ICD-10-CM

## 2021-09-29 DIAGNOSIS — Z515 Encounter for palliative care: Secondary | ICD-10-CM

## 2021-09-29 DIAGNOSIS — E43 Unspecified severe protein-calorie malnutrition: Secondary | ICD-10-CM

## 2021-09-29 DIAGNOSIS — D638 Anemia in other chronic diseases classified elsewhere: Secondary | ICD-10-CM

## 2021-09-29 DIAGNOSIS — R410 Disorientation, unspecified: Secondary | ICD-10-CM

## 2021-09-29 DIAGNOSIS — R5381 Other malaise: Secondary | ICD-10-CM

## 2021-09-29 DIAGNOSIS — R0600 Dyspnea, unspecified: Secondary | ICD-10-CM

## 2021-09-29 NOTE — Progress Notes (Signed)
[facility] Consult Note Telephone: [phone]  Fax: [phone]    Date of encounter: 09/29/21 2:49 PM PATIENT NAME: Jenna Dudley [place] [address]   [phone] (home)  DOB: [birth year] MRN: [id number] PRIMARY CARE PROVIDER:    Jovita Kussmaul, MD,  [place] [address] [phone]  REFERRING PROVIDER:   Jovita Dudley, [address] [place],  [place] [address] [phone]  RESPONSIBLE PARTY:    Contact Information     Name Relation Home Work Mobile   [place] Spouse [phone]  [phone]   [place] Daughter [phone]  [phone]        I met face to face with patient and family in  home. Palliative Care was asked to follow this patient by consultation request of  Jenna Dudley, Jenna Dudley* to address advance care planning and complex medical decision making. This is a follow up visit.                                   ASSESSMENT AND PLAN / RECOMMENDATIONS:   Advance Care Planning/Goals of Care: Goals include to maximize quality of life and symptom management. Patient/health care surrogate gave his/her permission to discuss.Our advance care planning conversation included a discussion about:    The value and importance of advance care planning  Experiences with loved ones who have been seriously ill or have died  Exploration of personal, cultural or spiritual beliefs that might influence medical decisions  Exploration of goals of care in the event of a sudden injury or illness  CODE STATUS: Full code, limited interventions  Symptom Management/Plan:  I met with patient and husband in their home. He endorses   Dyspnea has improved even though oxygen tubing has caused her to fall several times. She forgets it's on and gets up to the bathroom on her own. We discussed teaching her to take it off to go to the bathroom and that it is improving her  breathing, but she can take it off if safety necessitates. He has moved her bedside toilet closer to the sofa where she spends the day.   Caregiver strain husband endorses that he must check on her very frequently as she will get up for the bathroom without asking for assistance. The time she's fallen has been around these times. We discussed him reaching out for Caregiver help to give him a break . I outlined that caregivers could often do personal care make up beds and other light housekeeping. He said he might have a friend's mother who was doing this sort of work. Also, he stated he was a veteran and I've given him contact through the veteran services, as well as Resources in [employer] to reach out for resources.   Anemia. Patient has not had a CBC in three months. Last time it was assessed was post hospital stay in April. We discussed goals of care and patient comfort whether a CBC would change their treatment choices or not. He stated she has had one transfusion. I asked him to let PCP know and if needed, she could perhaps go to a lab in [place] or our RN could get labs.   CHF  Husband cancelled last CHF appt, making 4 cancelled I believe.  Has f/u with vascular in 2 weeks to assess stent. Husband endorses improved sore toe, impetus to placing stent.  Nutrition/intake: Husband endorses two meals a day. Usually breakfast is the bigger. She currently has soda and juice at the bedside. I encouraged endorsed water, even flavored, for hydration.  Behavior disturbances/insomnia: Much improved on sertraline and quetiapine. She was up all night, keeping care giver awake, then sleeping during day prior to this regimen change. Now she is resting well at hs as is her caregiver, and she is not as anxious or distressed, per husband.   Follow up Palliative Care Visit: Palliative care will continue to follow for complex medical decision making, advance care planning, and clarification of goals. Return 4  weeks or prn.  I spent 60 minutes providing this consultation. More than 50% of the time in this consultation was spent in counseling and care coordination.  PPS: 30%  HOSPICE ELIGIBILITY/DIAGNOSIS: TBD  Chief Complaint: debility, immobility, anemia, dyspnea, dementia  HISTORY OF PRESENT ILLNESS:  Jenna Dudley is a 86 y.o. year old female  with dementia, protein calorie malnutrition, anemia of chronic disease, abdominal pain . Patient seen today to review palliative care needs to include medical decision making and advance care planning as appropriate.   History obtained from review of EMR, discussion with primary team, and interview with family, facility staff/caregiver and/or Jenna Dudley.  I reviewed available labs, medications, imaging, studies and related documents from the EMR.  Records reviewed and summarized above.   ROS   General: NAD ENMT: denies dysphagia Pulmonary: denies cough, denies increased SOB Abdomen: endorses fair appetite, denies constipation, endorses continence of bowel GU: denies dysuria, endorses continence of urine MSK:  endorses   increased weakness,  several  falls reported, improved toe pain s/p stent Skin: denies rashes or wounds Neurological: endorses vague and longstanding  abdominal pain, denies insomnia Psych: Endorses positive mood, sleeping better, behavior disturbances improved.  Physical Exam: Current and past weights:unavailable Constitutional: NAD General: frail appearing, thin EYES: anicteric sclera, lids intact, no discharge  ENMT: intact hearing, oral mucous membranes moist, dentition missing CV: S1S2, RRR, no LE edema Pulmonary: LCTA, no increased work of breathing, no cough, oxygen per [place], 2 L Abdomen: intake 50%, normo-active BS + 4 quadrants, soft and non tender, no ascites MSK: severe  sarcopenia, moves all extremities, ambulatory with walker and assistance Skin: warm and dry, no rashes or wounds on visible skin Neuro:  +  generalized weakness,  + cognitive impairment, mild anxious affect. She can interact with limited few words, answering questions but cannot give descriptions of symptoms.   Thank you for the opportunity to participate in the care of Jenna Dudley.  The palliative care team will continue to follow. Please call our office at [phone] if we can be of additional assistance.   Jason Coop, NP DNP, AGPCNP-BC  COVID-19 PATIENT SCREENING TOOL Asked and negative response unless otherwise noted:   Have you had symptoms of covid, tested positive or been in contact with someone with symptoms/positive test in the past 5-10 days?

## 2021-10-05 ENCOUNTER — Ambulatory Visit (INDEPENDENT_AMBULATORY_CARE_PROVIDER_SITE_OTHER): Payer: Medicare Other | Admitting: Nurse Practitioner

## 2021-10-05 ENCOUNTER — Encounter (INDEPENDENT_AMBULATORY_CARE_PROVIDER_SITE_OTHER): Payer: Medicare Other

## 2021-11-01 ENCOUNTER — Other Ambulatory Visit: Payer: Medicare Other | Admitting: Primary Care

## 2021-11-01 DIAGNOSIS — R5381 Other malaise: Secondary | ICD-10-CM

## 2021-11-01 DIAGNOSIS — R630 Anorexia: Secondary | ICD-10-CM

## 2021-11-01 DIAGNOSIS — Z515 Encounter for palliative care: Secondary | ICD-10-CM

## 2021-11-01 NOTE — Progress Notes (Signed)
[facility] Consult Note Telephone: [phone]  Fax: [phone]    Date of encounter: 11/01/21 12:39 PM PATIENT NAME: Jenna Dudley [place] [address]   [phone] (home)  DOB: Aug 18, 1935 MRN: [id number] PRIMARY CARE PROVIDER:    Jovita Kussmaul, MD,  [place] [address] [phone]  REFERRING PROVIDER:   Jovita Kussmaul, [place],  [place] [address] [phone]  RESPONSIBLE PARTY:    Contact Information     Name Relation Home Work Mobile   [employer] Spouse [phone]  [phone]   [place] Daughter [phone]  [phone]        I met face to face with patient and family in  home. Palliative Care was asked to follow this patient by consultation request of  [place], Judithann Sheen* to address advance care planning and complex medical decision making. This is a follow up visit.                                   ASSESSMENT AND PLAN / RECOMMENDATIONS:   Advance Care Planning/Goals of Care: Goals include to maximize quality of life and symptom management. Patient/health care surrogate gave his/her permission to discuss.Our advance care planning conversation included a discussion about:    The value and importance of advance care planning  Exploration of personal, cultural or spiritual beliefs that might influence medical decisions  Exploration of goals of care in the event of a sudden injury or illness  Identification of a healthcare agent - Carloyn Manner  Review creation of an  advance directive document . Decision not to resuscitate  due to poor prognosis. CODE STATUS: DNR  I completed a MOST form today. The patient and family outlined their wishes for the following treatment decisions:  Cardiopulmonary Resuscitation: Do Not Attempt Resuscitation (DNR/No CPR)  Medical Interventions: Limited Additional Interventions: Use medical treatment, IV  fluids and cardiac monitoring as indicated, DO NOT USE intubation or mechanical ventilation. May consider use of less invasive airway support such as BiPAP or CPAP. Also provide comfort measures. Transfer to the hospital if indicated. Avoid intensive care.   Antibiotics: Antibiotics if indicated  IV Fluids: IV fluids for a defined trial period  Feeding Tube: No feeding tube   I spent 20 minutes providing this consultation. More than 50% of the time in this consultation was spent in counseling and care coordination. -----------------------------------------------------------------------------------------------------------------------------  Symptom Management/Plan:  Nutrition: Eating some better, early satiety.Weight 104 lbs. Continues to offer supplements.   Dyspena: Edema- none  noted, no dyspnea. Oxygen in place. No increased DOE. PO2 today is 98% on supplemental oxygen. Feeling well enough to visit daughter in [place] soon.  Mobility: Up in home, has concentrator for mobility, some ataxia of gait. Has walker but usually not using. Puts BSC by her during the day. Suggested mobility devices for safety.  Safety: Husband has med alert but wonders if she can use it. Looking into GPS enabled device. Has cameras to monitor when he is in other areas of the property.Endorses some trouble with oxygen tubing, discussed tether increases fall risk.  Follow up Palliative Care Visit: Palliative care will continue to follow for complex medical decision making, advance care planning, and clarification of goals. Return 6-8 weeks or prn.  This visit was coded based on medical decision making (MDM).  PPS: 30%  HOSPICE ELIGIBILITY/DIAGNOSIS: TBD  Chief Complaint:  debility, memory loss  HISTORY OF PRESENT ILLNESS:  Jenna Dudley is a 86 y.o. year old female  with debility, anorexia, dementia . Patient seen today to review palliative care needs to include medical decision making and advance care planning  as appropriate.   History obtained from review of EMR, discussion with primary team, and interview with family, facility staff/caregiver and/or Ms. Peron.  I reviewed available labs, medications, imaging, studies and related documents from the EMR.  Records reviewed and summarized above.   ROS   General: NAD ENMT: denies dysphagia Pulmonary: denies cough, denies increased SOB Abdomen: endorses fair  appetite, denies constipation, endorses continence of bowel GU: denies dysuria, endorses continence of urine MSK:  denies increased weakness,  no falls reported Skin: denies rashes or wounds Neurological: denies pain, denies insomnia Psych: Endorses anxious mood  Physical Exam: Current and past weights:unavailable Constitutional: NAD General: frail appearing, thin  EYES: anicteric sclera, lids intact, no discharge  ENMT: intact hearing, oral mucous membranes moist, edentulous CV:  no LE edema Pulmonary:  no increased work of breathing, no cough, room air Abdomen: intake 70%, soft and non tender, no ascites MSK: +sarcopenia, moves all extremities, ambulatory with assistance Skin: warm and dry, no rashes or wounds on visible skin Neuro:  no increased  generalized weakness, + cognitive impairment, anxious affect   Thank you for the opportunity to participate in the care of Ms. Gammage.  The palliative care team will continue to follow. Please call our office at [phone] if we can be of additional assistance.   Jason Coop, NP DNP, AGPCNP-BC  COVID-19 PATIENT SCREENING TOOL Asked and negative response unless otherwise noted:   Have you had symptoms of covid, tested positive or been in contact with someone with symptoms/positive test in the past 5-10 days?

## 2021-12-12 ENCOUNTER — Telehealth: Payer: Self-pay | Admitting: Primary Care

## 2021-12-12 NOTE — Telephone Encounter (Signed)
Patient expired on 04-Jan-2022 at her home.

## 2021-12-25 DEATH — deceased

## 2021-12-26 ENCOUNTER — Other Ambulatory Visit: Payer: Medicare Other | Admitting: Primary Care

## 2022-01-23 ENCOUNTER — Encounter (INDEPENDENT_AMBULATORY_CARE_PROVIDER_SITE_OTHER): Payer: Self-pay
